# Patient Record
Sex: Male | Born: 1950 | Race: White | Hispanic: No | Marital: Married | Smoking: Former smoker
Health system: Southern US, Community
[De-identification: ages and names within clinical notes are randomized; demographics above are authoritative.]

## PROBLEM LIST (undated history)

## (undated) DIAGNOSIS — E119 Type 2 diabetes mellitus without complications: Secondary | ICD-10-CM

## (undated) DIAGNOSIS — J449 Chronic obstructive pulmonary disease, unspecified: Secondary | ICD-10-CM

## (undated) DIAGNOSIS — H269 Unspecified cataract: Secondary | ICD-10-CM

## (undated) DIAGNOSIS — E7800 Pure hypercholesterolemia, unspecified: Secondary | ICD-10-CM

## (undated) DIAGNOSIS — N400 Enlarged prostate without lower urinary tract symptoms: Secondary | ICD-10-CM

## (undated) DIAGNOSIS — G4733 Obstructive sleep apnea (adult) (pediatric): Secondary | ICD-10-CM

## (undated) DIAGNOSIS — G709 Myoneural disorder, unspecified: Secondary | ICD-10-CM

## (undated) DIAGNOSIS — I10 Essential (primary) hypertension: Secondary | ICD-10-CM

## (undated) DIAGNOSIS — F419 Anxiety disorder, unspecified: Secondary | ICD-10-CM

## (undated) DIAGNOSIS — J45909 Unspecified asthma, uncomplicated: Secondary | ICD-10-CM

## (undated) DIAGNOSIS — M1990 Unspecified osteoarthritis, unspecified site: Secondary | ICD-10-CM

## (undated) DIAGNOSIS — I509 Heart failure, unspecified: Secondary | ICD-10-CM

## (undated) DIAGNOSIS — C801 Malignant (primary) neoplasm, unspecified: Secondary | ICD-10-CM

## (undated) DIAGNOSIS — I219 Acute myocardial infarction, unspecified: Secondary | ICD-10-CM

## (undated) DIAGNOSIS — K219 Gastro-esophageal reflux disease without esophagitis: Secondary | ICD-10-CM

## (undated) DIAGNOSIS — T7840XA Allergy, unspecified, initial encounter: Secondary | ICD-10-CM

## (undated) DIAGNOSIS — G4730 Sleep apnea, unspecified: Secondary | ICD-10-CM

## (undated) DIAGNOSIS — F1020 Alcohol dependence, uncomplicated: Secondary | ICD-10-CM

## (undated) DIAGNOSIS — R011 Cardiac murmur, unspecified: Secondary | ICD-10-CM

## (undated) HISTORY — PX: ACHILLES TENDON REPAIR: SUR1153

## (undated) HISTORY — PX: CARPAL TUNNEL RELEASE: SHX101

## (undated) HISTORY — DX: Acute myocardial infarction, unspecified: I21.9

## (undated) HISTORY — DX: Gastro-esophageal reflux disease without esophagitis: K21.9

## (undated) HISTORY — DX: Sleep apnea, unspecified: G47.30

## (undated) HISTORY — DX: Unspecified osteoarthritis, unspecified site: M19.90

## (undated) HISTORY — PX: KNEE SURGERY: SHX244

## (undated) HISTORY — PX: TONSILLECTOMY: SUR1361

## (undated) HISTORY — DX: Unspecified asthma, uncomplicated: J45.909

## (undated) HISTORY — PX: WRIST FRACTURE SURGERY: SHX121

## (undated) HISTORY — PX: EYE SURGERY: SHX253

## (undated) HISTORY — DX: Unspecified cataract: H26.9

## (undated) HISTORY — DX: Myoneural disorder, unspecified: G70.9

## (undated) HISTORY — DX: Cardiac murmur, unspecified: R01.1

## (undated) HISTORY — PX: KNEE ARTHROSCOPY: SUR90

## (undated) HISTORY — DX: Anxiety disorder, unspecified: F41.9

## (undated) HISTORY — PX: CARDIAC CATHETERIZATION: SHX172

## (undated) HISTORY — DX: Allergy, unspecified, initial encounter: T78.40XA

---

## 1998-11-04 ENCOUNTER — Encounter: Payer: Self-pay | Admitting: Interventional Cardiology

## 1998-11-04 ENCOUNTER — Observation Stay (HOSPITAL_COMMUNITY): Admission: AD | Admit: 1998-11-04 | Discharge: 1998-11-05 | Payer: Self-pay | Admitting: Interventional Cardiology

## 2004-03-11 ENCOUNTER — Encounter: Admission: RE | Admit: 2004-03-11 | Discharge: 2004-03-11 | Payer: Self-pay | Admitting: Interventional Cardiology

## 2004-03-15 ENCOUNTER — Inpatient Hospital Stay (HOSPITAL_BASED_OUTPATIENT_CLINIC_OR_DEPARTMENT_OTHER): Admission: RE | Admit: 2004-03-15 | Discharge: 2004-03-15 | Payer: Self-pay | Admitting: Cardiology

## 2004-04-05 ENCOUNTER — Encounter: Admission: RE | Admit: 2004-04-05 | Discharge: 2004-04-05 | Payer: Self-pay | Admitting: Family Medicine

## 2005-04-08 ENCOUNTER — Encounter: Admission: RE | Admit: 2005-04-08 | Discharge: 2005-04-08 | Payer: Self-pay | Admitting: Family Medicine

## 2005-10-21 ENCOUNTER — Encounter: Admission: RE | Admit: 2005-10-21 | Discharge: 2005-10-21 | Payer: Self-pay | Admitting: Family Medicine

## 2006-07-31 ENCOUNTER — Encounter: Admission: RE | Admit: 2006-07-31 | Discharge: 2006-07-31 | Payer: Self-pay | Admitting: Family Medicine

## 2006-11-08 ENCOUNTER — Encounter: Admission: RE | Admit: 2006-11-08 | Discharge: 2006-11-08 | Payer: Self-pay | Admitting: Endocrinology

## 2006-11-08 ENCOUNTER — Other Ambulatory Visit: Admission: RE | Admit: 2006-11-08 | Discharge: 2006-11-08 | Payer: Self-pay | Admitting: Interventional Radiology

## 2006-11-08 ENCOUNTER — Encounter (INDEPENDENT_AMBULATORY_CARE_PROVIDER_SITE_OTHER): Payer: Self-pay | Admitting: Interventional Radiology

## 2010-06-12 ENCOUNTER — Encounter: Payer: Self-pay | Admitting: Family Medicine

## 2010-06-13 ENCOUNTER — Encounter: Payer: Self-pay | Admitting: Family Medicine

## 2010-10-08 NOTE — Cardiovascular Report (Signed)
NAME:  EWARD, RUTIGLIANO NO.:  [id number]   MEDICAL RECORD NO.:  [id number]          PATIENT TYPE:  OIB   LOCATION:  6501                         FACILITY:  [facility]   PHYSICIAN:  Lyn Records III, M.D.DATE OF BIRTH:  18-Sep-1950   DATE OF PROCEDURE:  03/15/2004  DATE OF DISCHARGE:                              CARDIAC CATHETERIZATION   INDICATIONS:  Abnormal Cardiolite study, recent chest discomfort.   PROCEDURE PERFORMED:  1.  Left heart catheterization.  2.  Selective coronary angiography.  3.  Left ventriculography.  __________   DESCRIPTION:  After informed consent a 4-French sheath was placed in the  right femoral artery using the modified Seldinger technique.  A 4-French A2  multipurpose catheter was used for hemodynamic recordings, left  ventriculography by hand injection, and selective left and right coronary  angiography.  __________ intracoronary nitroglycerin __________ left  coronary after the first dye injection to relieve what appeared to be  diffuse left __________ spasm.  Patient tolerated the procedure without any  complications.   RESULTS:   I. HEMODYNAMIC DATA:  A.  Aortic pressure  120/7.  B.  Left ventricular pressure __________.   II. LEFT VENTRICULOGRAPHY:  The left ventricle is normal in size and  demonstrates normal overall contractility.  EF was 60%.   III. SELECTIVE CORONARY ANGIOGRAPHY:  A.  Left main coronary:  After first  coronary injection, there appeared to be diffuse intracoronary spasm  throughout the entire left coronary system.  This was relieved after a  single dose of intracoronary nitroglycerin __________.  B.  Left anterior descending coronary:  The left anterior descending artery  is large, transapical.  Gives origin to diagonal branches __________.  C.  Circumflex artery:  Circumflex coronary artery is large giving origin to  three obtuse marginal branches, entirely normal.  D.  Right coronary:  The right coronary  artery is large, gives origin to a  large PDA and two left ventricular branches.  This vessel is normal.   CONCLUSION:  1.  Diffuse left coronary spasm relieved with intracoronary nitroglycerin.  2.  There are no coronary obstructive lesions noted.  Coronary anatomy is      normally distributed and there are __________.  3.  Normal left ventricular function.   PLAN:  In addition to the current medical regimen for hypertension, will add  calcium channel blocker therapy __________ possibility of coronary artery  spasm.       HWS/MEDQ  D:  03/15/2004  T:  03/15/2004  Job:  [id number]   cc:   Oley Balm. Georgina Pillion, M.D.  [address]  [place]  [place] [address]  Fax: [phone]

## 2010-10-08 NOTE — Cardiovascular Report (Signed)
NAME:  Francis Cardenas, Francis Cardenas NO.:  [id number]   MEDICAL RECORD NO.:  [id number]          PATIENT TYPE:  OIB   LOCATION:  6501                         FACILITY:  [facility]   PHYSICIAN:  Lyn Records III, M.D.DATE OF BIRTH:  Feb 26, 1951   DATE OF PROCEDURE:  DATE OF DISCHARGE:                              CARDIAC CATHETERIZATION   No dictation on this job.       HWS/MEDQ  D:  03/15/2004  T:  03/15/2004  Job:  [id number]

## 2013-03-24 ENCOUNTER — Encounter (HOSPITAL_COMMUNITY): Payer: Self-pay | Admitting: Emergency Medicine

## 2013-03-24 ENCOUNTER — Emergency Department (HOSPITAL_COMMUNITY)
Admission: EM | Admit: 2013-03-24 | Discharge: 2013-03-26 | Disposition: A | Discharge status: Other Acute Inpatient Hospital | Payer: Non-veteran care | Attending: Emergency Medicine | Admitting: Emergency Medicine

## 2013-03-24 DIAGNOSIS — Z7982 Long term (current) use of aspirin: Secondary | ICD-10-CM | POA: Insufficient documentation

## 2013-03-24 DIAGNOSIS — Z8546 Personal history of malignant neoplasm of prostate: Secondary | ICD-10-CM | POA: Insufficient documentation

## 2013-03-24 DIAGNOSIS — F10229 Alcohol dependence with intoxication, unspecified: Secondary | ICD-10-CM | POA: Insufficient documentation

## 2013-03-24 DIAGNOSIS — I10 Essential (primary) hypertension: Secondary | ICD-10-CM | POA: Insufficient documentation

## 2013-03-24 DIAGNOSIS — Z79899 Other long term (current) drug therapy: Secondary | ICD-10-CM | POA: Insufficient documentation

## 2013-03-24 DIAGNOSIS — E119 Type 2 diabetes mellitus without complications: Secondary | ICD-10-CM | POA: Insufficient documentation

## 2013-03-24 DIAGNOSIS — F1020 Alcohol dependence, uncomplicated: Secondary | ICD-10-CM

## 2013-03-24 DIAGNOSIS — E7800 Pure hypercholesterolemia, unspecified: Secondary | ICD-10-CM | POA: Insufficient documentation

## 2013-03-24 DIAGNOSIS — F32A Depression, unspecified: Secondary | ICD-10-CM | POA: Diagnosis present

## 2013-03-24 DIAGNOSIS — F1010 Alcohol abuse, uncomplicated: Secondary | ICD-10-CM | POA: Diagnosis present

## 2013-03-24 DIAGNOSIS — F329 Major depressive disorder, single episode, unspecified: Secondary | ICD-10-CM

## 2013-03-24 DIAGNOSIS — Z87448 Personal history of other diseases of urinary system: Secondary | ICD-10-CM | POA: Insufficient documentation

## 2013-03-24 DIAGNOSIS — Z87891 Personal history of nicotine dependence: Secondary | ICD-10-CM | POA: Insufficient documentation

## 2013-03-24 HISTORY — DX: Type 2 diabetes mellitus without complications: E11.9

## 2013-03-24 HISTORY — DX: Malignant (primary) neoplasm, unspecified: C80.1

## 2013-03-24 HISTORY — DX: Pure hypercholesterolemia, unspecified: E78.00

## 2013-03-24 HISTORY — DX: Essential (primary) hypertension: I10

## 2013-03-24 HISTORY — DX: Benign prostatic hyperplasia without lower urinary tract symptoms: N40.0

## 2013-03-24 LAB — COMPREHENSIVE METABOLIC PANEL
AST: 41 U/L — ABNORMAL HIGH (ref 0–37)
Albumin: 3.9 g/dL (ref 3.5–5.2)
Calcium: 10.5 mg/dL (ref 8.4–10.5)
Creatinine, Ser: 1.11 mg/dL (ref 0.50–1.35)
GFR calc non Af Amer: 68 mL/min — ABNORMAL LOW (ref >90)
Glucose, Bld: 116 mg/dL — ABNORMAL HIGH (ref 70–99)
Sodium: 123 mEq/L — ABNORMAL LOW (ref 135–145)

## 2013-03-24 LAB — URINALYSIS, ROUTINE W REFLEX MICROSCOPIC
Bilirubin Urine: NEGATIVE
Glucose, UA: NEGATIVE mg/dL
Hgb urine dipstick: NEGATIVE
Specific Gravity, Urine: 1.014 (ref 1.005–1.030)
Urobilinogen, UA: 0.2 mg/dL (ref 0.0–1.0)

## 2013-03-24 LAB — LIPASE, BLOOD: Lipase: 36 U/L (ref 11–59)

## 2013-03-24 LAB — POCT I-STAT, CHEM 8
BUN: 16 mg/dL (ref 6–23)
Chloride: 95 mEq/L — ABNORMAL LOW (ref 96–112)
Creatinine, Ser: 1.4 mg/dL — ABNORMAL HIGH (ref 0.50–1.35)
Glucose, Bld: 127 mg/dL — ABNORMAL HIGH (ref 70–99)
Sodium: 131 mEq/L — ABNORMAL LOW (ref 135–145)
TCO2: 26 mmol/L (ref 0–100)

## 2013-03-24 LAB — CBC
MCH: 30.3 pg (ref 26.0–34.0)
MCV: 88.4 fL (ref 78.0–100.0)
Platelets: 216 10*3/uL (ref 150–400)
RDW: 13.9 % (ref 11.5–15.5)

## 2013-03-24 LAB — RAPID URINE DRUG SCREEN, HOSP PERFORMED
Amphetamines: NOT DETECTED
Cocaine: NOT DETECTED
Opiates: NOT DETECTED
Tetrahydrocannabinol: NOT DETECTED

## 2013-03-24 MED ORDER — FUROSEMIDE 20 MG PO TABS
20.0000 mg | ORAL_TABLET | Freq: Two times a day (BID) | ORAL | Status: DC
  Administered 2013-03-24 – 2013-03-25 (×3): 20 mg via ORAL
  Filled 2013-03-24 (×5): qty 1

## 2013-03-24 MED ORDER — PANTOPRAZOLE SODIUM 40 MG PO TBEC
40.0000 mg | DELAYED_RELEASE_TABLET | Freq: Every day | ORAL | Status: DC
  Administered 2013-03-24 – 2013-03-25 (×2): 40 mg via ORAL
  Filled 2013-03-24 (×2): qty 1

## 2013-03-24 MED ORDER — SPIRONOLACTONE 25 MG PO TABS
25.0000 mg | ORAL_TABLET | Freq: Two times a day (BID) | ORAL | Status: DC
  Administered 2013-03-25 (×3): 25 mg via ORAL
  Filled 2013-03-24 (×5): qty 1

## 2013-03-24 MED ORDER — CHLORDIAZEPOXIDE HCL 25 MG PO CAPS
25.0000 mg | ORAL_CAPSULE | Freq: Four times a day (QID) | ORAL | Status: AC
  Administered 2013-03-24 – 2013-03-25 (×4): 25 mg via ORAL
  Filled 2013-03-24 (×4): qty 1

## 2013-03-24 MED ORDER — CHLORDIAZEPOXIDE HCL 25 MG PO CAPS: 25.0000 mg | ORAL_CAPSULE | Freq: Four times a day (QID) | ORAL | Status: DC | PRN

## 2013-03-24 MED ORDER — ACETAMINOPHEN 325 MG PO TABS
650.0000 mg | ORAL_TABLET | Freq: Once | ORAL | Status: AC
Start: 2013-03-24 — End: 2013-03-24
  Administered 2013-03-24: 650 mg via ORAL
  Filled 2013-03-24: qty 2

## 2013-03-24 MED ORDER — SODIUM CHLORIDE 0.9 % IV BOLUS (SEPSIS)
1000.0000 mL | Freq: Once | INTRAVENOUS | Status: AC
  Administered 2013-03-24: 1000 mL via INTRAVENOUS

## 2013-03-24 MED ORDER — ADULT MULTIVITAMIN W/MINERALS CH
1.0000 | ORAL_TABLET | Freq: Every day | ORAL | Status: DC
  Administered 2013-03-24 – 2013-03-25 (×2): 1 via ORAL
  Filled 2013-03-24: qty 1

## 2013-03-24 MED ORDER — VITAMIN B-1 100 MG PO TABS
100.0000 mg | ORAL_TABLET | Freq: Every day | ORAL | Status: DC
  Administered 2013-03-25: 10:00:00 100 mg via ORAL
  Filled 2013-03-24: qty 1

## 2013-03-24 MED ORDER — ADULT MULTIVITAMIN W/MINERALS CH
1.0000 | ORAL_TABLET | Freq: Every day | ORAL | Status: DC
  Filled 2013-03-24: qty 1

## 2013-03-24 MED ORDER — HYDROXYZINE HCL 25 MG PO TABS: 25.0000 mg | ORAL_TABLET | Freq: Four times a day (QID) | ORAL | Status: DC | PRN

## 2013-03-24 MED ORDER — CHLORDIAZEPOXIDE HCL 25 MG PO CAPS: 25.0000 mg | ORAL_CAPSULE | ORAL | Status: DC

## 2013-03-24 MED ORDER — CHLORDIAZEPOXIDE HCL 25 MG PO CAPS
25.0000 mg | ORAL_CAPSULE | Freq: Once | ORAL | Status: AC
  Administered 2013-03-24: 25 mg via ORAL
  Filled 2013-03-24: qty 1

## 2013-03-24 MED ORDER — ASPIRIN EC 81 MG PO TBEC
81.0000 mg | DELAYED_RELEASE_TABLET | Freq: Every day | ORAL | Status: DC
  Administered 2013-03-25: 81 mg via ORAL
  Filled 2013-03-24: qty 1

## 2013-03-24 MED ORDER — THIAMINE HCL 100 MG/ML IJ SOLN
100.0000 mg | Freq: Once | INTRAMUSCULAR | Status: AC
  Administered 2013-03-24: 100 mg via INTRAMUSCULAR
  Filled 2013-03-24: qty 2

## 2013-03-24 MED ORDER — CARVEDILOL 25 MG PO TABS
25.0000 mg | ORAL_TABLET | Freq: Two times a day (BID) | ORAL | Status: DC
  Administered 2013-03-25 (×2): 25 mg via ORAL
  Filled 2013-03-24 (×3): qty 1

## 2013-03-24 MED ORDER — LISINOPRIL 40 MG PO TABS
40.0000 mg | ORAL_TABLET | Freq: Every day | ORAL | Status: DC
  Administered 2013-03-25: 40 mg via ORAL
  Filled 2013-03-24: qty 1

## 2013-03-24 MED ORDER — CHLORDIAZEPOXIDE HCL 25 MG PO CAPS
25.0000 mg | ORAL_CAPSULE | Freq: Three times a day (TID) | ORAL | Status: DC
  Administered 2013-03-25: 25 mg via ORAL
  Filled 2013-03-24: qty 1

## 2013-03-24 MED ORDER — LOPERAMIDE HCL 2 MG PO CAPS: 2.0000 mg | ORAL_CAPSULE | ORAL | Status: DC | PRN

## 2013-03-24 MED ORDER — AMLODIPINE BESYLATE 10 MG PO TABS
10.0000 mg | ORAL_TABLET | Freq: Every day | ORAL | Status: DC
  Administered 2013-03-25: 10 mg via ORAL
  Filled 2013-03-24: qty 1

## 2013-03-24 MED ORDER — SIMVASTATIN 20 MG PO TABS
20.0000 mg | ORAL_TABLET | Freq: Every day | ORAL | Status: DC
  Administered 2013-03-24 – 2013-03-25 (×2): 20 mg via ORAL
  Filled 2013-03-24 (×2): qty 1

## 2013-03-24 MED ORDER — SERTRALINE HCL 50 MG PO TABS
100.0000 mg | ORAL_TABLET | Freq: Every day | ORAL | Status: DC
  Administered 2013-03-24 – 2013-03-25 (×2): 100 mg via ORAL
  Filled 2013-03-24 (×2): qty 2

## 2013-03-24 MED ORDER — SERTRALINE HCL 50 MG PO TABS: 100.0000 mg | ORAL_TABLET | Freq: Every day | ORAL | Status: DC

## 2013-03-24 MED ORDER — ALBUTEROL SULFATE HFA 108 (90 BASE) MCG/ACT IN AERS: 2.0000 | INHALATION_SPRAY | Freq: Four times a day (QID) | RESPIRATORY_TRACT | Status: DC | PRN

## 2013-03-24 MED ORDER — ONDANSETRON 4 MG PO TBDP: 4.0000 mg | ORAL_TABLET | Freq: Four times a day (QID) | ORAL | Status: DC | PRN

## 2013-03-24 MED ORDER — CHLORDIAZEPOXIDE HCL 25 MG PO CAPS: 25.0000 mg | ORAL_CAPSULE | Freq: Every day | ORAL | Status: DC

## 2013-03-24 NOTE — ED Notes (Signed)
Bed: WA25 Expected date:  Expected time:  Means of arrival:  Comments: Hall A 

## 2013-03-24 NOTE — ED Notes (Signed)
Pt's belongings were moved to locker 25 TCU.

## 2013-03-24 NOTE — ED Notes (Signed)
Pt's belongings moved to locker 27

## 2013-03-24 NOTE — BH Assessment (Signed)
Assessment Note  Francis Cardenas is an 62 y.o. male.   Pt consumed beers as he drove himself to the ER today.  Pt reports "I drink everyday and I start when I get up."  [facility] and [facility] thru them due to disability.  Pt has been diagnosed with PTSD and is active with [facility] due to medical and SA related issues over the years.  Pt reports he is suicidal with no immediate plan or intent.  "If I could and I wanted to end my life I would use a gun, but I don't own one and have no plans to do it, but that would be the way if I did."  Pt denies HI and AVH.    Pt served in [employer] and was active in [place] War.  Pt served [phone] and again [phone]. 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  "My wife has always had what she needed.  Yet she doesn't want to help me."  When asked if his marriage is good pt responded "She would say so, but I don't think so."  Pt has been married for 17 years.  Pt reports [facility] [place] [facility].  Pt does not like to go to the [place] in [place] because it's too far.  Pt wants to be detoxed locally and then potentially sent to the [facility] rehab center.  "If there was a rehab locally I would prefer that over the [place]."    Recommendation  Pt would not be accepted at [facility] or [facility] due to [place] coverage and mild SI with plan with gun.  [facility] will consider pt for admission and psychiatry will round on pt to determine dispo.    Axis I: Generalized Anxiety Disorder, Major Depression, Recurrent severe, Post Traumatic Stress Disorder and Substance Abuse Axis II: Deferred Axis III:  Past Medical History  Diagnosis Date  . Diabetes mellitus without complication   . Hypertension   . Hypercholesteremia   . BPH (benign prostatic hyperplasia)   . Cancer     prostate   Axis IV: other psychosocial or environmental problems, problems related to social environment and problems with primary support group Axis V:  41-50 serious symptoms  Past Medical History:  Past Medical History  Diagnosis Date  . Diabetes mellitus without complication   . Hypertension   . Hypercholesteremia   . BPH (benign prostatic hyperplasia)   . Cancer     prostate    Past Surgical History  Procedure Laterality Date  . Tonsillectomy    . Knee arthroscopy Left   . Wrist fracture surgery Left   . Achilles tendon repair Left     Family History: No family history on file.  Social History:  reports that he has quit smoking. He does not have any smokeless tobacco history on file. He reports that he drinks about 10.8 ounces of alcohol per week. He reports that he does not use illicit drugs.  Additional Social History:  Alcohol / Drug Use Pain Medications: na Prescriptions: na Over the Counter: na History of alcohol / drug use?: Yes Longest period of sobriety (when/how long): 30 days Negative Consequences of Use: Personal relationships Substance #1 Name of Substance 1: alcohol 1 - Age of First Use: 15 1 - Amount (size/oz): 18 to 24 beers daily 1 - Frequency: daily 1 - Duration: years 1 - Last Use / Amount: 03-24-13  CIWA: CIWA-Ar BP: 122/46 mmHg Pulse Rate: 81 Nausea and Vomiting: no nausea and no vomiting Tactile Disturbances: none Tremor: no tremor Auditory Disturbances: not present Paroxysmal Sweats: no sweat visible Visual Disturbances: not present Anxiety: no anxiety, at ease Headache, Fullness in Head: none present Agitation: normal activity Orientation and Clouding of Sensorium: oriented and can do serial additions CIWA-Ar Total: 0 COWS:    Allergies: No Known Allergies  Home Medications:  (Not in a hospital admission)  OB/GYN Status:  No LMP for male patient.  General Assessment Data Location of Assessment: [facility] ED Is this a Tele or Face-to-Face Assessment?: Face-to-Face Is this an Initial Assessment or a Re-assessment for this encounter?: Initial Assessment Living Arrangements:  Spouse/significant other Can pt return to current living arrangement?: Yes Admission Status: Voluntary Is patient capable of signing voluntary admission?: Yes Transfer from: Acute Hospital Referral Source: MD  Medical Screening Exam [facility] Walk-in ONLY) Medical Exam completed: Yes  [facility] Crisis Care Plan Living Arrangements: Spouse/significant other  Education Status Is patient currently in school?: No Current Grade: na Highest grade of school patient has completed: na Name of school: na Contact person: na  Risk to self Suicidal Ideation: Yes-Currently Present Suicidal Intent: No-Not Currently/Within Last 6 Months Is patient at risk for suicide?: Yes Suicidal Plan?: Yes-Currently Present Specify Current Suicidal Plan: " I would use a gun, but I don't have one" Access to Means: No Specify Access to Suicidal Means: pt denies access to means What has been your use of drugs/alcohol within the last 12 months?: alcohol Previous Attempts/Gestures: No How many times?: 0 Other Self Harm Risks: na Triggers for Past Attempts: None known Intentional Self Injurious Behavior: None Family Suicide History: No Recent stressful life event(s): Other (Comment) (marriage issues) Persecutory voices/beliefs?: No Depression: Yes Depression Symptoms: Isolating;Fatigue;Guilt;Loss of interest in usual pleasures;Feeling worthless/self pity;Feeling angry/irritable Substance abuse history and/or treatment for substance abuse?: Yes Suicide prevention information given to non-admitted patients: Not applicable  Risk to Others Homicidal Ideation: No Thoughts of Harm to Others: No Current Homicidal Intent: No Current Homicidal Plan: No Access to Homicidal Means: No Identified Victim: na History of harm to others?: No Assessment of Violence: None Noted Violent Behavior Description: cooperative Does patient have access to weapons?: No (pt says no but pt was in [employer]) Criminal Charges Pending?:  No Does patient have a court date: No  Psychosis Hallucinations: None noted Delusions: None noted  Mental Status Report Appear/Hygiene: Disheveled Eye Contact: Good Motor Activity: Unremarkable Speech: Logical/coherent Level of Consciousness: Alert Mood: Depressed;Anxious;Sad;Worthless, low self-esteem Affect: Anxious;Appropriate to circumstance;Depressed;Sad Anxiety Level: Moderate Thought Processes: Coherent Judgement: Impaired Orientation: Person;Place;Situation;Appropriate for developmental age Obsessive Compulsive Thoughts/Behaviors: None  Cognitive Functioning Concentration: Decreased Memory: Recent Intact;Remote Intact IQ: Average Insight: Poor Impulse Control: Poor Appetite: Good Weight Loss: 0 Weight Gain: 0 Sleep: Decreased Total Hours of Sleep: 5 Vegetative Symptoms: None  ADLScreening [facility] Assessment Services) Patient's cognitive ability adequate to safely complete daily activities?: Yes Patient able to express need for assistance with ADLs?: Yes Independently performs ADLs?: Yes (appropriate for developmental age) (immediate needs - yes; difficult walking distance)  Prior Inpatient Therapy Prior Inpatient Therapy: Yes Prior Therapy Dates: 2012 Prior Therapy Facilty/Provider(s): inpatient in [place] Reason for Treatment: depression   Prior Outpatient Therapy Prior Outpatient Therapy: Yes Prior Therapy Dates: current [facility] Reason for Treatment: med mgt and optx  ADL Screening (condition at time of admission) Patient's cognitive ability adequate to safely complete daily activities?: Yes Is the patient  deaf or have difficulty hearing?: No Does the patient have difficulty seeing, even when wearing glasses/contacts?: No Does the patient have difficulty concentrating, remembering, or making decisions?: No Patient able to express need for assistance with ADLs?: Yes Does the patient have difficulty dressing  or bathing?: No Independently performs ADLs?: Yes (appropriate for developmental age) (immediate needs - yes; difficult walking distance) Does the patient have difficulty walking or climbing stairs?: No Weakness of Legs: None Weakness of Arms/Hands: None  Home Assistive Devices/Equipment Home Assistive Devices/Equipment: None (aches and issues; can take care of needs; )  Therapy Consults (therapy consults require a physician order) PT Evaluation Needed: No OT Evalulation Needed: No SLP Evaluation Needed: No Abuse/Neglect Assessment (Assessment to be complete while patient is alone) Physical Abuse: Denies Verbal Abuse: Denies Sexual Abuse: Denies Exploitation of patient/patient's resources: Denies Self-Neglect: Denies Values / Beliefs Cultural Requests During Hospitalization: None Spiritual Requests During Hospitalization: None Consults Spiritual Care Consult Needed: No Social Work Consult Needed: No Merchant navy officer (For Healthcare) Advance Directive: Patient does not have advance directive Pre-existing out of facility DNR order (yellow form or pink MOST form): No    Additional Information 1:1 In Past 12 Months?: No CIRT Risk: No Elopement Risk: No Does patient have medical clearance?: Yes     Disposition:  Disposition Initial Assessment Completed for this Encounter: Yes Disposition of Patient: Inpatient treatment program Type of inpatient treatment program: Adult  On Site Evaluation by:   Reviewed with Physician:    Titus Mould, Eppie Gibson 03/24/2013 1:04 PM

## 2013-03-24 NOTE — ED Notes (Signed)
TTS at bedside. 

## 2013-03-24 NOTE — ED Notes (Signed)
Pt belongings x2 bags placed into locker # 27  

## 2013-03-24 NOTE — ED Provider Notes (Signed)
CSN: [id number]     Arrival date & time 03/24/13  1043 History   First MD Initiated Contact with Patient 03/24/13 1146     Chief Complaint  Patient presents with  . Alcohol Problem   (Consider location/radiation/quality/duration/timing/severity/associated sxs/prior Treatment) HPI Comments: Pt states that he has been drinking since he was 15:pt states that he drinks a minimum of 18 beers a day and he had one on the way here:pt states that he had inpt detox 2 years ago in [place]:pt states that he is not suicidal today although his has had thoughts in the past, but he states that he would need to use a gun and he doesn't have one:pt states that he came in today because he has isolated himself from everyone in his family:pt states that he can't go anywhere because he can't not drink or if he does go somewhere he brings a cooler so that he can drink when he is finished  The history is provided by the patient. No language interpreter was used.    Past Medical History  Diagnosis Date  . Diabetes mellitus without complication   . Hypertension   . Hypercholesteremia   . BPH (benign prostatic hyperplasia)   . Cancer     prostate   Past Surgical History  Procedure Laterality Date  . Tonsillectomy    . Knee arthroscopy Left   . Wrist fracture surgery Left   . Achilles tendon repair Left    No family history on file. History  Substance Use Topics  . Smoking status: Former Games developer  . Smokeless tobacco: Not on file  . Alcohol Use: 10.8 oz/week    18 Cans of beer per week    Review of Systems  Constitutional: Negative.   Respiratory: Negative.   Cardiovascular: Negative.     Allergies  Review of patient's allergies indicates no known allergies.  Home Medications   Current Outpatient Rx  Name  Route  Sig  Dispense  Refill  . albuterol (PROVENTIL HFA;VENTOLIN HFA) 108 (90 BASE) MCG/ACT inhaler   Inhalation   Inhale 2 puffs into the lungs every 6 (six) hours as needed for wheezing.         Marland Kitchen amLODipine (NORVASC) 10 MG tablet   Oral   Take 10 mg by mouth daily.         Marland Kitchen aspirin EC 81 MG tablet   Oral   Take 81 mg by mouth daily.         . carvedilol (COREG) 25 MG tablet   Oral   Take 25 mg by mouth 2 (two) times daily with a meal.         . furosemide (LASIX) 20 MG tablet   Oral   Take 20 mg by mouth 2 (two) times daily.         Marland Kitchen lisinopril (PRINIVIL,ZESTRIL) 40 MG tablet   Oral   Take 40 mg by mouth daily.         . Multiple Vitamin (MULTIVITAMIN WITH MINERALS) TABS tablet   Oral   Take 1 tablet by mouth daily.         Marland Kitchen omeprazole (PRILOSEC) 20 MG capsule   Oral   Take 20 mg by mouth daily.         . pravastatin (PRAVACHOL) 40 MG tablet   Oral   Take 80 mg by mouth at bedtime.         . sertraline (ZOLOFT) 100 MG tablet   Oral  Take 100 mg by mouth daily.         Marland Kitchen spironolactone (ALDACTONE) 25 MG tablet   Oral   Take 25 mg by mouth 2 (two) times daily.          BP 122/46  Pulse 81  Temp(Src) 98.5 F (36.9 C) (Oral)  Resp 18  SpO2 94% Physical Exam  Nursing note and vitals reviewed. Constitutional: He is oriented to person, place, and time. He appears well-developed and well-nourished.  HENT:  Head: Normocephalic and atraumatic.  Cardiovascular: Normal rate and regular rhythm.   Pulmonary/Chest: Effort normal and breath sounds normal.  Abdominal: Soft. Bowel sounds are normal. There is no tenderness.  Musculoskeletal: Normal range of motion.  Neurological: He is alert and oriented to person, place, and time. Coordination normal.  Skin: Skin is warm and dry.  Psychiatric: He has a normal mood and affect.    ED Course  Procedures (including critical care time) Labs Review Labs Reviewed  CBC - Abnormal; Notable for the following:    Hemoglobin 12.8 (*)    HCT 37.3 (*)    All other components within normal limits  COMPREHENSIVE METABOLIC PANEL - Abnormal; Notable for the following:    Sodium 123 (*)     Chloride 85 (*)    Glucose, Bld 116 (*)    AST 41 (*)    GFR calc non Af Amer 68 (*)    GFR calc Af Amer 79 (*)    All other components within normal limits  ETHANOL - Abnormal; Notable for the following:    Alcohol, Ethyl (B) 168 (*)    All other components within normal limits  LIPASE, BLOOD  URINE RAPID DRUG SCREEN (HOSP PERFORMED)  URINALYSIS, ROUTINE W REFLEX MICROSCOPIC   Imaging Review No results found.  EKG Interpretation   None       MDM   1. Alcohol abuse   2. Depressive disorder    Pt not having any dt's at this time:pt to be evaluated by act and placement to be found    Teressa Lower, NP 03/24/13 2026

## 2013-03-24 NOTE — ED Notes (Signed)
Pt from home requesting detox from [facility]. Pt states that his last drink was on the way here today. Pt normally drinks 12-18, 12oz can of beer/day. Pt is A&O and in NAD

## 2013-03-24 NOTE — Consult Note (Signed)
[facility] Face-to-Face Psychiatry Consult   Reason for Consult:  Alcohol detox and depression Referring Physician:  EDP  Francis Cardenas is an 62 y.o. male.  Assessment: AXIS I:  Alcohol Abuse and Depressive Disorder NOS AXIS II:  Deferred AXIS III:   Past Medical History  Diagnosis Date  . Diabetes mellitus without complication   . Hypertension   . Hypercholesteremia   . BPH (benign prostatic hyperplasia)   . Cancer     prostate   AXIS IV:  other psychosocial or environmental problems and problems with primary support group AXIS V:  41-50 serious symptoms  Plan:  No evidence of imminent risk to self or others at present.   Supportive therapy provided about ongoing stressors. Discussed crisis plan, support from social network, calling 911, coming to the Emergency Department, and calling Suicide Hotline.  Subjective:   Francis Cardenas is a 62 y.o. male.  HPI:  Patient states that he needs help with alcohol detox and depression. "I want to be detox and put into rehab.  Patient denies suicidal ideation at this time. "I have had suicide thoughts in the past but not at this time.  Patient also states that he has a history of hearing noises "You know like thumps and bumps or someone call and look not there.  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  Patient states that his last drink was today when he drank 2 beers on his way to the hospital.  Patient lives at home with his wife and states that his drinking alcohol has also affected their relationship.  "I need to get some help with alcohol."    HPI Elements:   Location:  [facility] ED'. Quality:  Affecting patient mentally. Severity:  Alcohol abuse.  Past Psychiatric History: Past Medical History  Diagnosis Date  . Diabetes mellitus without complication   . Hypertension   . Hypercholesteremia    . BPH (benign prostatic hyperplasia)   . Cancer     prostate    reports that he has quit smoking. He does not have any smokeless tobacco history on file. He reports that he drinks about 10.8 ounces of alcohol per week. He reports that he does not use illicit drugs. No family history on file. Family History Substance Abuse: No Family Supports: Yes, List: (pt reports marriage is not supportive) Living Arrangements: Spouse/significant other Can pt return to current living arrangement?: Yes Abuse/Neglect [facility]) Physical Abuse: Denies Verbal Abuse: Denies Sexual Abuse: Denies Allergies:  No Known Allergies  ACT Assessment Complete:  Yes:    Educational Status    Risk to Self: Risk to self Suicidal Ideation: Yes-Currently Present Suicidal Intent: No-Not Currently/Within Last 6 Months Is patient at risk for suicide?: Yes Suicidal Plan?: Yes-Currently Present Specify Current Suicidal Plan: " I would use a gun, but I don't have one" Access to Means: No Specify Access to Suicidal Means: pt denies access to means What has been your use of drugs/alcohol within the last 12 months?: alcohol Previous Attempts/Gestures: No How many times?: 0 Other Self Harm Risks: na Triggers for Past Attempts: None known Intentional Self Injurious Behavior: None Family Suicide History: No Recent stressful life event(s): Other (Comment) (marriage issues) Persecutory voices/beliefs?: No Depression: Yes Depression Symptoms: Isolating;Fatigue;Guilt;Loss of interest in usual pleasures;Feeling worthless/self pity;Feeling angry/irritable Substance abuse history and/or treatment for substance abuse?:  Yes Suicide prevention information given to non-admitted patients: Not applicable  Risk to Others: Risk to Others Homicidal Ideation: No Thoughts of Harm to Others: No Current Homicidal Intent: No Current Homicidal Plan: No Access to Homicidal Means: No Identified Victim: na History of harm to others?:  No Assessment of Violence: None Noted Violent Behavior Description: cooperative Does patient have access to weapons?: No (pt says no but pt was in [employer]) Criminal Charges Pending?: No Does patient have a court date: No  Abuse: Abuse/Neglect Assessment (Assessment to be complete while patient is alone) Physical Abuse: Denies Verbal Abuse: Denies Sexual Abuse: Denies Exploitation of patient/patient's resources: Denies Self-Neglect: Denies  Prior Inpatient Therapy: Prior Inpatient Therapy Prior Inpatient Therapy: Yes Prior Therapy Dates: 2012 Prior Therapy Facilty/Provider(s): inpatient in [place] Reason for Treatment: depression   Prior Outpatient Therapy: Prior Outpatient Therapy Prior Outpatient Therapy: Yes Prior Therapy Dates: current [facility] Reason for Treatment: med mgt and optx  Additional Information: Additional Information 1:1 In Past 12 Months?: No CIRT Risk: No Elopement Risk: No Does patient have medical clearance?: Yes                  Objective: Blood pressure 122/46, pulse 81, temperature 98.5 F (36.9 C), temperature source Oral, resp. rate 18, SpO2 94.00%.There is no height or weight on file to calculate BMI. Results for orders placed during the hospital encounter of 03/24/13 (from the past 72 hour(s))  CBC     Status: Abnormal   Collection Time    03/24/13 12:19 PM      Result Value Range   WBC 8.7  4.0 - 10.5 K/uL   RBC 4.22  4.22 - 5.81 MIL/uL   Hemoglobin 12.8 (*) 13.0 - 17.0 g/dL   HCT 60.4 (*) 54.0 - 98.1 %   MCV 88.4  78.0 - 100.0 fL   MCH 30.3  26.0 - 34.0 pg   MCHC 34.3  30.0 - 36.0 g/dL   RDW 19.1  47.8 - 29.5 %   Platelets 216  150 - 400 K/uL  COMPREHENSIVE METABOLIC PANEL     Status: Abnormal   Collection Time    03/24/13 12:19 PM      Result Value Range   Sodium 123 (*) 135 - 145 mEq/L   Potassium 4.0  3.5 - 5.1 mEq/L   Chloride 85 (*) 96 - 112 mEq/L   CO2 25  19 - 32 mEq/L    Glucose, Bld 116 (*) 70 - 99 mg/dL   BUN 13  6 - 23 mg/dL   Creatinine, Ser 6.21  0.50 - 1.35 mg/dL   Calcium 30.8  8.4 - 65.7 mg/dL   Total Protein 7.8  6.0 - 8.3 g/dL   Albumin 3.9  3.5 - 5.2 g/dL   AST 41 (*) 0 - 37 U/L   ALT 42  0 - 53 U/L   Alkaline Phosphatase 86  39 - 117 U/L   Total Bilirubin 0.3  0.3 - 1.2 mg/dL   GFR calc non Af Amer 68 (*) >90 mL/min   GFR calc Af Amer 79 (*) >90 mL/min   Comment: (NOTE)     The eGFR has been calculated using the CKD EPI equation.     This calculation has not been validated in all clinical situations.     eGFR's persistently <90 mL/min signify possible Chronic Kidney     Disease.  LIPASE, BLOOD     Status: None   Collection  Time    03/24/13 12:19 PM      Result Value Range   Lipase 36  11 - 59 U/L  ETHANOL     Status: Abnormal   Collection Time    03/24/13 12:19 PM      Result Value Range   Alcohol, Ethyl (B) 168 (*) 0 - 11 mg/dL   Comment:            LOWEST DETECTABLE LIMIT FOR     SERUM ALCOHOL IS 11 mg/dL     FOR MEDICAL PURPOSES ONLY  URINALYSIS, ROUTINE W REFLEX MICROSCOPIC     Status: None   Collection Time    03/24/13 12:55 PM      Result Value Range   Color, Urine YELLOW  YELLOW   APPearance CLEAR  CLEAR   Specific Gravity, Urine 1.014  1.005 - 1.030   pH 6.0  5.0 - 8.0   Glucose, UA NEGATIVE  NEGATIVE mg/dL   Hgb urine dipstick NEGATIVE  NEGATIVE   Bilirubin Urine NEGATIVE  NEGATIVE   Ketones, ur NEGATIVE  NEGATIVE mg/dL   Protein, ur NEGATIVE  NEGATIVE mg/dL   Urobilinogen, UA 0.2  0.0 - 1.0 mg/dL   Nitrite NEGATIVE  NEGATIVE   Leukocytes, UA NEGATIVE  NEGATIVE   Comment: MICROSCOPIC NOT DONE ON URINES WITH NEGATIVE PROTEIN, BLOOD, LEUKOCYTES, NITRITE, OR GLUCOSE <1000 mg/dL.  URINE RAPID DRUG SCREEN (HOSP PERFORMED)     Status: None   Collection Time    03/24/13 12:56 PM      Result Value Range   Opiates NONE DETECTED  NONE DETECTED   Cocaine NONE DETECTED  NONE DETECTED   Benzodiazepines NONE DETECTED   NONE DETECTED   Amphetamines NONE DETECTED  NONE DETECTED   Tetrahydrocannabinol NONE DETECTED  NONE DETECTED   Barbiturates NONE DETECTED  NONE DETECTED   Comment:            DRUG SCREEN FOR MEDICAL PURPOSES     ONLY.  IF CONFIRMATION IS NEEDED     FOR ANY PURPOSE, NOTIFY LAB     WITHIN 5 DAYS.                LOWEST DETECTABLE LIMITS     FOR URINE DRUG SCREEN     Drug Class       Cutoff (ng/mL)     Amphetamine      1000     Barbiturate      200     Benzodiazepine   200     Tricyclics       300     Opiates          300     Cocaine          300     THC              50    No current facility-administered medications for this encounter.   Current Outpatient Prescriptions  Medication Sig Dispense Refill  . albuterol (PROVENTIL HFA;VENTOLIN HFA) 108 (90 BASE) MCG/ACT inhaler Inhale 2 puffs into the lungs every 6 (six) hours as needed for wheezing.      Marland Kitchen amLODipine (NORVASC) 10 MG tablet Take 10 mg by mouth daily.      Marland Kitchen aspirin EC 81 MG tablet Take 81 mg by mouth daily.      . carvedilol (COREG) 25 MG tablet Take 25 mg by mouth 2 (two) times daily with a meal.      .  furosemide (LASIX) 20 MG tablet Take 20 mg by mouth 2 (two) times daily.      Marland Kitchen lisinopril (PRINIVIL,ZESTRIL) 40 MG tablet Take 40 mg by mouth daily.      . Multiple Vitamin (MULTIVITAMIN WITH MINERALS) TABS tablet Take 1 tablet by mouth daily.      Marland Kitchen omeprazole (PRILOSEC) 20 MG capsule Take 20 mg by mouth daily.      . pravastatin (PRAVACHOL) 40 MG tablet Take 80 mg by mouth at bedtime.      . sertraline (ZOLOFT) 100 MG tablet Take 100 mg by mouth daily.      Marland Kitchen spironolactone (ALDACTONE) 25 MG tablet Take 25 mg by mouth 2 (two) times daily.        Psychiatric Specialty Exam:     Blood pressure 122/46, pulse 81, temperature 98.5 F (36.9 C), temperature source Oral, resp. rate 18, SpO2 94.00%.There is no height or weight on file to calculate BMI.  General Appearance: Disheveled  Eye Contact::  Good  Speech:   Clear and Coherent and Normal Rate  Volume:  Normal  Mood:  Anxious and Depressed  Affect:  Depressed  Thought Process:  Circumstantial and Goal Directed  Orientation:  Full (Time, Place, and Person)  Thought Content:  Rumination  Suicidal Thoughts:  No  Homicidal Thoughts:  No  Memory:  Immediate;   Good Recent;   Good  Judgement:  Impaired  Insight:  Lacking  Psychomotor Activity:  None noted at this time  Concentration:  Fair  Recall:  Good  Akathisia:  No  Handed:  Right  AIMS (if indicated):     Assets:  Communication Skills Desire for Improvement Housing Transportation  Sleep:      Face to face interview and consulted with Dr. Tawni Carnes  Treatment Plan Summary: Monitor overnight for alcohol withdrawal  Disposition:  Start Librium protocol.  Monitor overnight for alcohol withdrawal symptoms.  Discharge home with outpatient resources Refer to [facility] IOP.     Assunta Found, FNP-BC 03/24/2013 3:43 PM  This case was discussed with FNP at length over phone. Agree with above assessment and plan.  Ancil Linsey, MD

## 2013-03-25 LAB — COMPREHENSIVE METABOLIC PANEL
ALT: 37 U/L (ref 0–53)
AST: 33 U/L (ref 0–37)
Alkaline Phosphatase: 83 U/L (ref 39–117)
CO2: 25 mEq/L (ref 19–32)
Calcium: 10.8 mg/dL — ABNORMAL HIGH (ref 8.4–10.5)
Chloride: 97 mEq/L (ref 96–112)
Creatinine, Ser: 0.96 mg/dL (ref 0.50–1.35)
GFR calc non Af Amer: 86 mL/min — ABNORMAL LOW (ref >90)
Glucose, Bld: 163 mg/dL — ABNORMAL HIGH (ref 70–99)
Potassium: 3.8 mEq/L (ref 3.5–5.1)
Sodium: 132 mEq/L — ABNORMAL LOW (ref 135–145)

## 2013-03-25 NOTE — Progress Notes (Addendum)
Pt accepting to [facility] pending bed availability.  Pt referred to [facility] pending review.   Per chart review, Pt declined from [facility], [facility].  No beds at [facility] or [facility].

## 2013-03-25 NOTE — Progress Notes (Signed)
Spoke with patient about nocturnal CPAP. He states the RN has just left his room from letting him know that there is a bed assignment at another facility and he is to move soon. This information confirmed with RN. CPAP is not permitted in [facility] atmosphere for safety concerns and patient will not be moved back to a regular ED bed since he has a bed at [facility]

## 2013-03-25 NOTE — Progress Notes (Addendum)
   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 ( per [place]) EPIC had pt listed as self pay Pt states his pcp is at [facility] [phone] to reach one of pcp's RNs     Subjective/Objective Assessment Detail:     Action/Plan:   [facility] ED CM spoke ED SW about ED SW notes ED CM spoke with Madelaine Bhat in [facility] ED registration to get pt coverage updated in EPIC   Action/Plan Detail:   Anticipated DC Date:       Status Recommendation to Physician:   Result of Recommendation:    Other ED Services  Consult Working Plan    DC [employer]  Other    Choice offered to / List presented to:            Status of service:  Completed, signed off  ED Comments:   ED Comments Detail:

## 2013-03-25 NOTE — Consult Note (Signed)
[facility] Face-to-Face Psychiatry Consult   Reason for Consult:  Alcohol detox and depression Referring Physician:  EDP  Francis Cardenas is an 62 y.o. male.  Assessment: AXIS I:  Alcohol Abuse and Depressive Disorder NOS AXIS II:  Deferred AXIS III:   Past Medical History  Diagnosis Date  . Diabetes mellitus without complication   . Hypertension   . Hypercholesteremia   . BPH (benign prostatic hyperplasia)   . Cancer     prostate   AXIS IV:  other psychosocial or environmental problems and problems with primary support group AXIS V:  41-50 serious symptoms  Plan:  No evidence of imminent risk to self or others at present.   Supportive therapy provided about ongoing stressors. Discussed crisis plan, support from social network, calling 911, coming to the Emergency Department, and calling Suicide Hotline.  Subjective:   Francis Cardenas is a 62 y.o. male.  HPI:  Pt was interviewed today with FNP Julieanne Cotton. Pt reports drinking alcohol daily for the last 2 years, on average 18 beers per day. He reports a history of blackouts, but denies withdrawal seizures. He reports taking zoloft 100 mg daily for PTSD, but is unsure if it is helping. +Poor sleep. He denies current SI/HI/AVH.  Mild tremors reported. Mood is sad, and he feels that his wife is not supportive.    HPI Elements:   Location:  [facility] ED'. Quality:  Affecting patient mentally. Severity:  Alcohol abuse.  Past Psychiatric History: Past Medical History  Diagnosis Date  . Diabetes mellitus without complication   . Hypertension   . Hypercholesteremia   . BPH (benign prostatic hyperplasia)   . Cancer     prostate    reports that he has quit smoking. He does not have any smokeless tobacco history on file. He reports that he drinks about 10.8 ounces of alcohol per week. He reports that he does not use illicit drugs. No family history on file. Family History Substance Abuse: No Family Supports: Yes, List: (pt  reports marriage is not supportive) Living Arrangements: Spouse/significant other Can pt return to current living arrangement?: Yes Abuse/Neglect [facility]) Physical Abuse: Denies Verbal Abuse: Denies Sexual Abuse: Denies Allergies:  No Known Allergies  ACT Assessment Complete:  Yes:    Educational Status    Risk to Self: Risk to self Suicidal Ideation: Yes-Currently Present Suicidal Intent: No-Not Currently/Within Last 6 Months Is patient at risk for suicide?: Yes Suicidal Plan?: Yes-Currently Present Specify Current Suicidal Plan: " I would use a gun, but I don't have one" Access to Means: No Specify Access to Suicidal Means: pt denies access to means What has been your use of drugs/alcohol within the last 12 months?: alcohol Previous Attempts/Gestures: No How many times?: 0 Other Self Harm Risks: na Triggers for Past Attempts: None known Intentional Self Injurious Behavior: None Family Suicide History: No Recent stressful life event(s): Other (Comment) (marriage issues) Persecutory voices/beliefs?: No Depression: Yes Depression Symptoms: Isolating;Fatigue;Guilt;Loss of interest in usual pleasures;Feeling worthless/self pity;Feeling angry/irritable Substance abuse history and/or treatment for substance abuse?: Yes Suicide prevention information given to non-admitted patients: Not applicable  Risk to Others: Risk to Others Homicidal Ideation: No Thoughts of Harm to Others: No Current Homicidal Intent: No Current Homicidal Plan: No Access to Homicidal Means: No Identified Victim: na History of harm to others?: No Assessment of Violence: None Noted Violent Behavior Description: cooperative Does patient have access to weapons?: No (pt says no but pt was in [employer]) Criminal Charges Pending?: No  Does patient have a court date: No  Abuse: Abuse/Neglect Assessment (Assessment to be complete while patient is alone) Physical Abuse: Denies Verbal Abuse: Denies Sexual Abuse:  Denies Exploitation of patient/patient's resources: Denies Self-Neglect: Denies  Prior Inpatient Therapy: Prior Inpatient Therapy Prior Inpatient Therapy: Yes Prior Therapy Dates: 2012 Prior Therapy Facilty/Provider(s): inpatient in [place] Reason for Treatment: depression   Prior Outpatient Therapy: Prior Outpatient Therapy Prior Outpatient Therapy: Yes Prior Therapy Dates: current [facility] Reason for Treatment: med mgt and optx  Additional Information: Additional Information 1:1 In Past 12 Months?: No CIRT Risk: No Elopement Risk: No Does patient have medical clearance?: Yes                  Objective: Blood pressure 150/74, pulse 74, temperature 98.6 F (37 C), temperature source Oral, resp. rate 18, SpO2 95.00%.There is no height or weight on file to calculate BMI. Results for orders placed during the hospital encounter of 03/24/13 (from the past 72 hour(s))  CBC     Status: Abnormal   Collection Time    03/24/13 12:19 PM      Result Value Range   WBC 8.7  4.0 - 10.5 K/uL   RBC 4.22  4.22 - 5.81 MIL/uL   Hemoglobin 12.8 (*) 13.0 - 17.0 g/dL   HCT 16.1 (*) 09.6 - 04.5 %   MCV 88.4  78.0 - 100.0 fL   MCH 30.3  26.0 - 34.0 pg   MCHC 34.3  30.0 - 36.0 g/dL   RDW 40.9  81.1 - 91.4 %   Platelets 216  150 - 400 K/uL  COMPREHENSIVE METABOLIC PANEL     Status: Abnormal   Collection Time    03/24/13 12:19 PM      Result Value Range   Sodium 123 (*) 135 - 145 mEq/L   Potassium 4.0  3.5 - 5.1 mEq/L   Chloride 85 (*) 96 - 112 mEq/L   CO2 25  19 - 32 mEq/L   Glucose, Bld 116 (*) 70 - 99 mg/dL   BUN 13  6 - 23 mg/dL   Creatinine, Ser 7.82  0.50 - 1.35 mg/dL   Calcium 95.6  8.4 - 21.3 mg/dL   Total Protein 7.8  6.0 - 8.3 g/dL   Albumin 3.9  3.5 - 5.2 g/dL   AST 41 (*) 0 - 37 U/L   ALT 42  0 - 53 U/L   Alkaline Phosphatase 86  39 - 117 U/L   Total Bilirubin 0.3  0.3 - 1.2 mg/dL   GFR calc non Af Amer 68 (*) >90  mL/min   GFR calc Af Amer 79 (*) >90 mL/min   Comment: (NOTE)     The eGFR has been calculated using the CKD EPI equation.     This calculation has not been validated in all clinical situations.     eGFR's persistently <90 mL/min signify possible Chronic Kidney     Disease.  LIPASE, BLOOD     Status: None   Collection Time    03/24/13 12:19 PM      Result Value Range   Lipase 36  11 - 59 U/L  ETHANOL     Status: Abnormal   Collection Time    03/24/13 12:19 PM      Result Value Range   Alcohol, Ethyl (B) 168 (*) 0 - 11 mg/dL   Comment:            LOWEST  DETECTABLE LIMIT FOR     SERUM ALCOHOL IS 11 mg/dL     FOR MEDICAL PURPOSES ONLY  URINALYSIS, ROUTINE W REFLEX MICROSCOPIC     Status: None   Collection Time    03/24/13 12:55 PM      Result Value Range   Color, Urine YELLOW  YELLOW   APPearance CLEAR  CLEAR   Specific Gravity, Urine 1.014  1.005 - 1.030   pH 6.0  5.0 - 8.0   Glucose, UA NEGATIVE  NEGATIVE mg/dL   Hgb urine dipstick NEGATIVE  NEGATIVE   Bilirubin Urine NEGATIVE  NEGATIVE   Ketones, ur NEGATIVE  NEGATIVE mg/dL   Protein, ur NEGATIVE  NEGATIVE mg/dL   Urobilinogen, UA 0.2  0.0 - 1.0 mg/dL   Nitrite NEGATIVE  NEGATIVE   Leukocytes, UA NEGATIVE  NEGATIVE   Comment: MICROSCOPIC NOT DONE ON URINES WITH NEGATIVE PROTEIN, BLOOD, LEUKOCYTES, NITRITE, OR GLUCOSE <1000 mg/dL.  URINE RAPID DRUG SCREEN (HOSP PERFORMED)     Status: None   Collection Time    03/24/13 12:56 PM      Result Value Range   Opiates NONE DETECTED  NONE DETECTED   Cocaine NONE DETECTED  NONE DETECTED   Benzodiazepines NONE DETECTED  NONE DETECTED   Amphetamines NONE DETECTED  NONE DETECTED   Tetrahydrocannabinol NONE DETECTED  NONE DETECTED   Barbiturates NONE DETECTED  NONE DETECTED   Comment:            DRUG SCREEN FOR MEDICAL PURPOSES     ONLY.  IF CONFIRMATION IS NEEDED     FOR ANY PURPOSE, NOTIFY LAB     WITHIN 5 DAYS.                LOWEST DETECTABLE LIMITS     FOR URINE DRUG  SCREEN     Drug Class       Cutoff (ng/mL)     Amphetamine      1000     Barbiturate      200     Benzodiazepine   200     Tricyclics       300     Opiates          300     Cocaine          300     THC              50  POCT I-STAT, CHEM 8     Status: Abnormal   Collection Time    03/24/13  4:08 PM      Result Value Range   Sodium 131 (*) 135 - 145 mEq/L   Potassium 4.6  3.5 - 5.1 mEq/L   Chloride 95 (*) 96 - 112 mEq/L   BUN 16  6 - 23 mg/dL   Creatinine, Ser 1.61 (*) 0.50 - 1.35 mg/dL   Glucose, Bld 096 (*) 70 - 99 mg/dL   Calcium, Ion 0.45  4.09 - 1.30 mmol/L   TCO2 26  0 - 100 mmol/L   Hemoglobin 13.9  13.0 - 17.0 g/dL   HCT 81.1  91.4 - 78.2 %    Current Facility-Administered Medications  Medication Dose Route Frequency Provider Last Rate Last Dose  . albuterol (PROVENTIL HFA;VENTOLIN HFA) 108 (90 BASE) MCG/ACT inhaler 2 puff  2 puff Inhalation Q6H PRN Teressa Lower, NP      . amLODipine (NORVASC) tablet 10 mg  10 mg Oral Daily Teressa Lower, NP   10  mg at 03/25/13 0936  . aspirin EC tablet 81 mg  81 mg Oral Daily Teressa Lower, NP   81 mg at 03/25/13 0936  . carvedilol (COREG) tablet 25 mg  25 mg Oral BID WC Teressa Lower, NP   25 mg at 03/25/13 0854  . chlordiazePOXIDE (LIBRIUM) capsule 25 mg  25 mg Oral Q6H PRN Shuvon Rankin, NP      . chlordiazePOXIDE (LIBRIUM) capsule 25 mg  25 mg Oral QID Shuvon Rankin, NP   25 mg at 03/25/13 1610   Followed by  . chlordiazePOXIDE (LIBRIUM) capsule 25 mg  25 mg Oral TID Shuvon Rankin, NP       Followed by  . [START ON 03/26/2013] chlordiazePOXIDE (LIBRIUM) capsule 25 mg  25 mg Oral BH-qamhs Shuvon Rankin, NP       Followed by  . [START ON 03/27/2013] chlordiazePOXIDE (LIBRIUM) capsule 25 mg  25 mg Oral Daily Shuvon Rankin, NP      . furosemide (LASIX) tablet 20 mg  20 mg Oral BID Teressa Lower, NP   20 mg at 03/25/13 0854  . hydrOXYzine (ATARAX/VISTARIL) tablet 25 mg  25 mg Oral Q6H PRN Shuvon Rankin, NP      .  lisinopril (PRINIVIL,ZESTRIL) tablet 40 mg  40 mg Oral Daily Teressa Lower, NP   40 mg at 03/25/13 0937  . loperamide (IMODIUM) capsule 2-4 mg  2-4 mg Oral PRN Shuvon Rankin, NP      . multivitamin with minerals tablet 1 tablet  1 tablet Oral Daily Shuvon Rankin, NP   1 tablet at 03/25/13 9604  . multivitamin with minerals tablet 1 tablet  1 tablet Oral Daily Teressa Lower, NP      . ondansetron (ZOFRAN-ODT) disintegrating tablet 4 mg  4 mg Oral Q6H PRN Shuvon Rankin, NP      . pantoprazole (PROTONIX) EC tablet 40 mg  40 mg Oral Daily Teressa Lower, NP   40 mg at 03/25/13 0937  . sertraline (ZOLOFT) tablet 100 mg  100 mg Oral Daily Shuvon Rankin, NP   100 mg at 03/25/13 0938  . sertraline (ZOLOFT) tablet 100 mg  100 mg Oral Daily Teressa Lower, NP      . simvastatin (ZOCOR) tablet 20 mg  20 mg Oral q1800 Teressa Lower, NP   20 mg at 03/24/13 1837  . spironolactone (ALDACTONE) tablet 25 mg  25 mg Oral BID Teressa Lower, NP   25 mg at 03/25/13 0938  . thiamine (VITAMIN B-1) tablet 100 mg  100 mg Oral Daily Shuvon Rankin, NP   100 mg at 03/25/13 5409   Current Outpatient Prescriptions  Medication Sig Dispense Refill  . albuterol (PROVENTIL HFA;VENTOLIN HFA) 108 (90 BASE) MCG/ACT inhaler Inhale 2 puffs into the lungs every 6 (six) hours as needed for wheezing.      Marland Kitchen amLODipine (NORVASC) 10 MG tablet Take 10 mg by mouth daily.      Marland Kitchen aspirin EC 81 MG tablet Take 81 mg by mouth daily.      . carvedilol (COREG) 25 MG tablet Take 25 mg by mouth 2 (two) times daily with a meal.      . furosemide (LASIX) 20 MG tablet Take 20 mg by mouth 2 (two) times daily.      Marland Kitchen lisinopril (PRINIVIL,ZESTRIL) 40 MG tablet Take 40 mg by mouth daily.      . Multiple Vitamin (MULTIVITAMIN WITH MINERALS) TABS tablet Take 1 tablet by mouth daily.      Marland Kitchen omeprazole (  PRILOSEC) 20 MG capsule Take 20 mg by mouth daily.      . pravastatin (PRAVACHOL) 40 MG tablet Take 80 mg by mouth at bedtime.      .  sertraline (ZOLOFT) 100 MG tablet Take 100 mg by mouth daily.      Marland Kitchen spironolactone (ALDACTONE) 25 MG tablet Take 25 mg by mouth 2 (two) times daily.        Psychiatric Specialty Exam:     Blood pressure 150/74, pulse 74, temperature 98.6 F (37 C), temperature source Oral, resp. rate 18, SpO2 95.00%.There is no height or weight on file to calculate BMI.  General Appearance: Disheveled  Eye Contact::  Good  Speech:  Clear and Coherent and Normal Rate  Volume:  Normal  Mood:  Anxious and Depressed  Affect:  Depressed  Thought Process:  Circumstantial and Goal Directed  Orientation:  Full (Time, Place, and Person)  Thought Content:  Rumination  Suicidal Thoughts:  No  Homicidal Thoughts:  No  Memory:  Immediate;   Good Recent;   Good  Judgement:  Impaired  Insight:  Lacking  Psychomotor Activity:  None noted at this time  Concentration:  Fair  Recall:  Good  Akathisia:  No  Handed:  Right  AIMS (if indicated):     Assets:  Communication Skills Desire for Improvement Housing Transportation  Sleep:        Treatment Plan Summary: Monitor overnight for alcohol withdrawal  Disposition:  continue Librium protocol. Repeat CMP.  Monitor overnight for alcohol withdrawal symptoms.  Refer to inpatient treatment.   Ancil Linsey, MD 03/25/2013 1:26 PM

## 2013-03-26 ENCOUNTER — Encounter (HOSPITAL_COMMUNITY): Payer: Self-pay | Admitting: *Deleted

## 2013-03-26 ENCOUNTER — Inpatient Hospital Stay (HOSPITAL_COMMUNITY)
Admission: AD | Admit: 2013-03-26 | Discharge: 2013-04-01 | DRG: 897 | Disposition: A | Discharge status: Home / Self Care | Payer: Non-veteran care | Source: Intra-hospital | Attending: Psychiatry | Admitting: Psychiatry

## 2013-03-26 DIAGNOSIS — F1010 Alcohol abuse, uncomplicated: Secondary | ICD-10-CM

## 2013-03-26 DIAGNOSIS — F4310 Post-traumatic stress disorder, unspecified: Secondary | ICD-10-CM | POA: Diagnosis present

## 2013-03-26 DIAGNOSIS — F332 Major depressive disorder, recurrent severe without psychotic features: Secondary | ICD-10-CM | POA: Diagnosis present

## 2013-03-26 DIAGNOSIS — F1020 Alcohol dependence, uncomplicated: Principal | ICD-10-CM | POA: Diagnosis present

## 2013-03-26 DIAGNOSIS — E119 Type 2 diabetes mellitus without complications: Secondary | ICD-10-CM | POA: Diagnosis present

## 2013-03-26 DIAGNOSIS — F411 Generalized anxiety disorder: Secondary | ICD-10-CM | POA: Diagnosis present

## 2013-03-26 DIAGNOSIS — F39 Unspecified mood [affective] disorder: Secondary | ICD-10-CM

## 2013-03-26 DIAGNOSIS — I10 Essential (primary) hypertension: Secondary | ICD-10-CM | POA: Diagnosis present

## 2013-03-26 DIAGNOSIS — F329 Major depressive disorder, single episode, unspecified: Secondary | ICD-10-CM

## 2013-03-26 DIAGNOSIS — E7800 Pure hypercholesterolemia, unspecified: Secondary | ICD-10-CM | POA: Diagnosis present

## 2013-03-26 DIAGNOSIS — Z79899 Other long term (current) drug therapy: Secondary | ICD-10-CM

## 2013-03-26 MED ORDER — PANTOPRAZOLE SODIUM 40 MG PO TBEC
40.0000 mg | DELAYED_RELEASE_TABLET | Freq: Every day | ORAL | Status: DC
Start: 2013-03-26 — End: 2013-04-01
  Administered 2013-03-26 – 2013-03-31 (×6): 40 mg via ORAL
  Filled 2013-03-26 (×8): qty 1

## 2013-03-26 MED ORDER — SIMVASTATIN 5 MG PO TABS
5.0000 mg | ORAL_TABLET | Freq: Every day | ORAL | Status: DC
  Administered 2013-03-26 – 2013-03-31 (×6): 5 mg via ORAL
  Filled 2013-03-26 (×7): qty 1

## 2013-03-26 MED ORDER — FUROSEMIDE 20 MG PO TABS
20.0000 mg | ORAL_TABLET | Freq: Two times a day (BID) | ORAL | Status: DC
  Administered 2013-03-26 – 2013-03-31 (×12): 20 mg via ORAL
  Filled 2013-03-26 (×15): qty 1

## 2013-03-26 MED ORDER — ADULT MULTIVITAMIN W/MINERALS CH
1.0000 | ORAL_TABLET | Freq: Every day | ORAL | Status: DC
  Administered 2013-03-26 – 2013-03-31 (×6): 1 via ORAL
  Filled 2013-03-26 (×8): qty 1

## 2013-03-26 MED ORDER — VITAMIN B-1 100 MG PO TABS
100.0000 mg | ORAL_TABLET | Freq: Every day | ORAL | Status: DC
  Administered 2013-03-27 – 2013-03-31 (×5): 100 mg via ORAL
  Filled 2013-03-26 (×7): qty 1

## 2013-03-26 MED ORDER — LISINOPRIL 20 MG PO TABS
40.0000 mg | ORAL_TABLET | Freq: Every day | ORAL | Status: DC
  Administered 2013-03-26 – 2013-03-31 (×6): 40 mg via ORAL
  Filled 2013-03-26 (×8): qty 1

## 2013-03-26 MED ORDER — CHLORDIAZEPOXIDE HCL 25 MG PO CAPS
25.0000 mg | ORAL_CAPSULE | Freq: Four times a day (QID) | ORAL | Status: AC | PRN
  Administered 2013-03-26: 25 mg via ORAL
  Filled 2013-03-26: qty 1

## 2013-03-26 MED ORDER — ASPIRIN EC 81 MG PO TBEC
81.0000 mg | DELAYED_RELEASE_TABLET | Freq: Every day | ORAL | Status: DC
  Administered 2013-03-26 – 2013-03-31 (×6): 81 mg via ORAL
  Filled 2013-03-26 (×8): qty 1

## 2013-03-26 MED ORDER — CHLORDIAZEPOXIDE HCL 25 MG PO CAPS
25.0000 mg | ORAL_CAPSULE | Freq: Every day | ORAL | Status: AC
  Administered 2013-03-29: 25 mg via ORAL
  Filled 2013-03-26: qty 1

## 2013-03-26 MED ORDER — CHLORDIAZEPOXIDE HCL 25 MG PO CAPS
25.0000 mg | ORAL_CAPSULE | ORAL | Status: AC
  Administered 2013-03-28 (×2): 25 mg via ORAL
  Filled 2013-03-26 (×2): qty 1

## 2013-03-26 MED ORDER — ALUM & MAG HYDROXIDE-SIMETH 200-200-20 MG/5ML PO SUSP: 30.0000 mL | ORAL | Status: DC | PRN

## 2013-03-26 MED ORDER — MAGNESIUM HYDROXIDE 400 MG/5ML PO SUSP: 30.0000 mL | Freq: Every day | ORAL | Status: DC | PRN

## 2013-03-26 MED ORDER — CARVEDILOL 25 MG PO TABS
25.0000 mg | ORAL_TABLET | Freq: Two times a day (BID) | ORAL | Status: DC
  Administered 2013-03-26 – 2013-03-31 (×12): 25 mg via ORAL
  Filled 2013-03-26 (×10): qty 1
  Filled 2013-03-26: qty 28
  Filled 2013-03-26 (×5): qty 1

## 2013-03-26 MED ORDER — LOPERAMIDE HCL 2 MG PO CAPS: 2.0000 mg | ORAL_CAPSULE | ORAL | Status: AC | PRN

## 2013-03-26 MED ORDER — ALBUTEROL SULFATE HFA 108 (90 BASE) MCG/ACT IN AERS: 2.0000 | INHALATION_SPRAY | Freq: Four times a day (QID) | RESPIRATORY_TRACT | Status: DC | PRN

## 2013-03-26 MED ORDER — SERTRALINE HCL 100 MG PO TABS
100.0000 mg | ORAL_TABLET | Freq: Every day | ORAL | Status: DC
  Administered 2013-03-26 – 2013-03-31 (×6): 100 mg via ORAL
  Filled 2013-03-26 (×8): qty 1

## 2013-03-26 MED ORDER — CHLORDIAZEPOXIDE HCL 25 MG PO CAPS
25.0000 mg | ORAL_CAPSULE | Freq: Four times a day (QID) | ORAL | Status: AC
  Administered 2013-03-26 (×4): 25 mg via ORAL
  Filled 2013-03-26 (×4): qty 1

## 2013-03-26 MED ORDER — CHLORDIAZEPOXIDE HCL 25 MG PO CAPS
25.0000 mg | ORAL_CAPSULE | Freq: Three times a day (TID) | ORAL | Status: AC
  Administered 2013-03-27 (×3): 25 mg via ORAL
  Filled 2013-03-26 (×3): qty 1

## 2013-03-26 MED ORDER — ONDANSETRON 4 MG PO TBDP: 4.0000 mg | ORAL_TABLET | Freq: Four times a day (QID) | ORAL | Status: AC | PRN

## 2013-03-26 MED ORDER — SPIRONOLACTONE 25 MG PO TABS
25.0000 mg | ORAL_TABLET | Freq: Two times a day (BID) | ORAL | Status: DC
  Administered 2013-03-26 – 2013-03-31 (×12): 25 mg via ORAL
  Filled 2013-03-26 (×15): qty 1

## 2013-03-26 MED ORDER — THIAMINE HCL 100 MG/ML IJ SOLN: 100.0000 mg | Freq: Once | INTRAMUSCULAR | Status: DC

## 2013-03-26 MED ORDER — HYDROXYZINE HCL 25 MG PO TABS: 25.0000 mg | ORAL_TABLET | Freq: Four times a day (QID) | ORAL | Status: AC | PRN

## 2013-03-26 MED ORDER — ACETAMINOPHEN 325 MG PO TABS
650.0000 mg | ORAL_TABLET | Freq: Four times a day (QID) | ORAL | Status: DC | PRN
Start: 2013-03-26 — End: 2013-04-01
  Administered 2013-03-27 – 2013-03-29 (×3): 650 mg via ORAL
  Filled 2013-03-26 (×3): qty 2

## 2013-03-26 MED ORDER — AMLODIPINE BESYLATE 10 MG PO TABS
10.0000 mg | ORAL_TABLET | Freq: Every day | ORAL | Status: DC
  Administered 2013-03-26 – 2013-03-28 (×3): 10 mg via ORAL
  Filled 2013-03-26 (×5): qty 1

## 2013-03-26 NOTE — Progress Notes (Signed)
Did nt attend

## 2013-03-26 NOTE — [facility] Group Notes (Signed)
[facility] LCSW Group Therapy  03/26/2013 3:11 PM  Type of Therapy:  Group Therapy  Participation Level:  Active  Participation Quality:  Attentive  Affect:  Appropriate  Cognitive:  Alert and Oriented  Insight:  Engaged  Engagement in Therapy:  Engaged  Modes of Intervention:  Discussion, Education, Socialization and Support  Summary of Progress/Problems: MHA Speaker came to talk about his personal journey with substance abuse and addiction. Today's speaker did not show up to group. CSW provided pt with [facility] and [facility]HA information (handout) and reviewed this information in detail. Francis Cardenas shared that he plans to utilize MHA groups in the future and expressed interest in the mental health and substance abuse groups. Francis Cardenas was pleasant and attentive throughout group.  Smart, Francis Cardenas 03/26/2013, 3:11 PM

## 2013-03-26 NOTE — Progress Notes (Signed)
Patient ID: Francis Cardenas, male   DOB: 1951/01/28, 62 y.o.   MRN: [id number] He has been up and to groups interacting with peers and staff. He voiced that his withdrawals were sweating and slight tremors.  He voiced that he could not stand for long periods of time thus he needs to be at the front of the line.

## 2013-03-26 NOTE — Progress Notes (Signed)
Vol admit to the 300 hall requesting help to detox from alcohol.  Pt states he has been drinking since he was 61 yrs old.  He says he drinks almost daily and has to drink so that he does not go into withdrawals.  He says he is tired of living this way and wants help to stop.  He wants to be detoxed close to home, then be sent to a [place] rehab for further treatment.  Pt was in the [employer] for several years. Pt reports medical hx of HTN, hypercholesteremia, BPH, DM, sleep apnea, and prostate cancer.  Pt was pleasant/cooperative with the admission process.  Librium protocol initiated.  Oriented to unit/room.  Safety checks q15 minutes initiated.

## 2013-03-26 NOTE — ED Notes (Signed)
Reported that belongings sheet was blank and only had key tape to sheet. Patient valuables were not documented on sheet. Contents that was locked up were given to patient to take to [facility] per MIchelle, MHT and security officer Wall.

## 2013-03-26 NOTE — Tx Team (Signed)
Initial Interdisciplinary Treatment Plan  PATIENT STRENGTHS: (choose at least two) Ability for insight Average or above average intelligence Capable of independent living Communication skills General fund of knowledge Motivation for treatment/growth Supportive family/friends Work skills  PATIENT STRESSORS: Health problems Marital or family conflict Occupational concerns Substance abuse   PROBLEM LIST: Problem List/Patient Goals Date to be addressed Date deferred Reason deferred Estimated date of resolution  Alcohol abuse      Risk for self harm      Need help to stop drinking once and for all                                           DISCHARGE CRITERIA:  Improved stabilization in mood, thinking, and/or behavior Medical problems require only outpatient monitoring Motivation to continue treatment in a less acute level of care Verbal commitment to aftercare and medication compliance Withdrawal symptoms are absent or subacute and managed without 24-hour nursing intervention  PRELIMINARY DISCHARGE PLAN: Attend aftercare/continuing care group Attend 12-step recovery group Outpatient therapy Return to previous living arrangement  PATIENT/FAMIILY INVOLVEMENT: This treatment plan has been presented to and reviewed with the patient, Francis Cardenas, and/or family member.  The patient and family have been given the opportunity to ask questions and make suggestions.  Jesus Genera [facility] 03/26/2013, 4:52 AM

## 2013-03-26 NOTE — [facility] Suicide Risk Assessment (Signed)
[facility] INPATIENT:  Family/Significant Other Suicide Prevention Education  Suicide Prevention Education:  Contact Attempts: Francis Cardenas (pt's wife) [phone] has been identified by the patient as the family member/significant other with whom the patient will be residing, and identified as the person(s) who will aid the patient in the event of a mental health crisis.  With written consent from the patient, two attempts were made to provide suicide prevention education, prior to and/or following the patient's discharge.  We were unsuccessful in providing suicide prevention education.  A suicide education pamphlet was given to the patient to share with family/significant other.  Date and time of first attempt: 10:45AM 03/26/13 Date and time of second attempt: 2:20PM 03/26/13 (voicemail left)  Smart, Francis Cardenas LCSWA 03/26/2013, 2:23 PM

## 2013-03-26 NOTE — Progress Notes (Signed)
Adult Psychoeducational Group Note  Date:  03/26/2013 Time:  11:03 AM  Group Topic/Focus:  Therapeutic Activity  Participation Level:  Did Not Attend  Participation Quality:  Did Not Attend  Affect:  Did Not Attend  Cognitive:  Did Not Attend  Insight: None  Engagement in Group:  None  Modes of Intervention:  Activity, Discussion, Education and Socialization  Additional Comments:  Pt did not attend the therapeutic activity.  Francis Cardenas 03/26/2013, 11:03 AM

## 2013-03-26 NOTE — Progress Notes (Signed)
D:Pt has been in the bed this afternoon and presents with a mild tremor, moist skin and lights mildly bothering eyes.  A:Offered support, encouragement and 15 minute checks. R:Pt denies si and hi. Safety maintained on the unit.

## 2013-03-26 NOTE — ED Notes (Signed)
Patient discharge no acute distress noted.
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
## 2013-03-26 NOTE — Progress Notes (Signed)
Recreation Therapy Notes  Date: 11.04.2014 Time: 2:30pm Location: [address] Dayroom  Group Topic: Software engineer Activities (AAA)  Behavioral Response: Engaged, Attentive   Affect: Euthymic  Clinical Observations/Feedback: Dog Team: [employer]. Patient interacted appropriately with peer, dog team, and LRT.   Marykay Lex Deakin Lacek, LRT/CTRS  Shaivi Rothschild L 03/26/2013 4:53 PM

## 2013-03-26 NOTE — BHH Suicide Risk Assessment (Signed)
Suicide Risk Assessment  Admission Assessment     Nursing information obtained from:  Patient;Review of record Demographic factors:  Male;Caucasian;Unemployed;Access to firearms Current Mental Status:  NA (Pt SI at ED, but denies now) Loss Factors:  Decline in physical health Historical Factors:  NA Risk Reduction Factors:  Living with another person, especially a relative;Positive social support  CLINICAL FACTORS:   Depression:   Comorbid alcohol abuse/dependence Alcohol/Substance Abuse/Dependencies  COGNITIVE FEATURES THAT CONTRIBUTE TO RISK:  Closed-mindedness Polarized thinking Thought constriction (tunnel vision)    SUICIDE RISK:   Moderate:  Frequent suicidal ideation with limited intensity, and duration, some specificity in terms of plans, no associated intent, good self-control, limited dysphoria/symptomatology, some risk factors present, and identifiable protective factors, including available and accessible social support.  PLAN OF CARE: Supportive approach/coping skills/relapse prevention                              Librium detox protocol                              Reassess and address the co morbidities  I certify that inpatient services furnished can reasonably be expected to improve the patient's condition.  Tresia Revolorio A 03/26/2013, 6:22 PM

## 2013-03-27 DIAGNOSIS — F1994 Other psychoactive substance use, unspecified with psychoactive substance-induced mood disorder: Secondary | ICD-10-CM

## 2013-03-27 NOTE — Progress Notes (Signed)
Adult Psychoeducational Group Note  Date:  03/27/2013 Time:  3:40 PM  Group Topic/Focus:  Coping With Mental Health Crisis:   The purpose of this group is to help patients identify strategies for coping with mental health crisis.  Group discusses possible causes of crisis and ways to manage them effectively. Crisis Planning:   The purpose of this group is to help patients create a crisis plan for use upon discharge or in the future, as needed.  Participation Level:  Active  Participation Quality:  Appropriate and Attentive  Affect:  Appropriate  Cognitive:  Alert and Appropriate  Insight: Appropriate  Engagement in Group:  Engaged  Modes of Intervention:  Discussion and Education  Additional Comments:  Pt was active during this group. Pt shared experiences of different triggers that contributed to his mental crisis. Pt was open in sharing and was very supportive of others during group.  Francis Cardenas 03/27/2013, 3:40 PM

## 2013-03-27 NOTE — [facility] Group Notes (Signed)
[facility] LCSW Group Therapy  03/27/2013 3:28 PM  Type of Therapy:  Group Therapy  Participation Level:  Active  Participation Quality:  Attentive  Affect:  Appropriate  Cognitive:  Alert and Oriented  Insight:  Engaged  Engagement in Therapy:  Engaged  Modes of Intervention:  Discussion, Education, Exploration, Socialization and Support  Summary of Progress/Problems: Emotion Regulation: This group focused on both positive and negative emotion identification and allowed group members to process ways to identify feelings, regulate negative emotions, and find healthy ways to manage internal/external emotions. Group members were asked to reflect on a time when their reaction to an emotion led to a negative outcome and explored how alternative responses using emotion regulation would have benefited them. Group members were also asked to discuss a time when emotion regulation was utilized when a negative emotion was experienced. Perlie was attentive and engaged throughout today's therapy group. He identified "boredom and loneliness" as the two emotions that he struggles with most and often leads to alcohol abuse. Bing explored how drinking serves to "give him something to do and numb the hurt" of feeling bored and alone. Ambers talked about the communication problems with his wife and was open to ideas for how to improve his communication and increase his social network by joining AA, support groups, etc." Kasson shows progress in the group setting and improving insight AEB his ability to openly participate in the group setting and connect how his negative emotions and limited support network fuel his substance abuse.    Smart, Tyrek Lawhorn 03/27/2013, 3:28 PM

## 2013-03-27 NOTE — Progress Notes (Signed)
D: Pt denies SI/HI/AVH. Pt is pleasant and cooperative. Pt states he had a pretty good day, just tired but hope to get to the [place].  A: Pt was offered support and encouragement. Pt was given scheduled medications. Pt was encourage to attend groups. Q 15 minute checks were done for safety.   R: Pt is taking medication. Pt has no complaints.Pt receptive to treatment and safety maintained on unit.

## 2013-03-27 NOTE — Progress Notes (Signed)
Recreation Therapy Notes  Date: 11.04.2014 Time: 3:00pm Location: [address] Dayroom  Group Topic: Communication, Team Building, Problem Solving  Goal Area(s) Addresses:  Patient will effectively work with peer towards shared goal.  Patient will identify skill used to make activity successful.  Patient will identify how skills used during activity can be used to reach post d/c goals.   Behavioral Response: Did not attend.   Jian Hodgman L Issa Luster, LRT/CTRS  Irineo Gaulin L 03/27/2013 8:26 AM 

## 2013-03-27 NOTE — Progress Notes (Signed)
Patient ID: Francis Cardenas, male   DOB: February 19, 1951, 62 y.o.   MRN: [id number]  D: Patient is pleasant and cooperative upon approach. Patient denies SI, HI and A/V hallucinations. Patient states that he has chronic pain all over his body but complains of a headache and requests medication. Patient is seen in the milieu interacting with staff and other patients. Patient witnessed another patient fall in the cafeteria earlier and patient stated, "I have PTSD and that brought up some emotions. Knowing I could not help him was hard." Patient was able to calm down after the incident.  A: Emotional support and encouragement was given to patient. PRN Tylenol was given to patient for pain.   R: Pt. Is receptive to treatment and does not express any other needs at this time. Q15 minute safety checks are maintained. Patient is safe at this time.

## 2013-03-27 NOTE — Progress Notes (Signed)
Patient ID: Francis Cardenas, male   DOB: 03/01/1951, 62 y.o.   MRN: [id number]   Patient received C-PAP machine from home and is using it PRN for naps and at bedtime. Verbal order was put in per Dr. Dub Mikes to allow patient to have C-PAP at those times. Writer was unsure of how to place the order in Epic. Writer contacted [employer] [facility] multiple times for guidance/suggestion but was unable to reach anyone. Patient is safe at this time.

## 2013-03-27 NOTE — Progress Notes (Signed)
[facility] MD Progress Note  03/27/2013 3:53 PM Francis Cardenas  MRN:  [id number] Subjective:  [name] states he had a hard time after another patient collapsed this morning. States he became full of anxiety, panic, it brought him memories, flashbacks of what his had experienced in the past. He is still feeling unstable, light headed. Trying to figure things out.  Diagnosis:   DSM5: Schizophrenia Disorders:  None Obsessive-Compulsive Disorders:  None Trauma-Stressor Disorders:  Posttraumatic Stress Disorder (309.81) Substance/Addictive Disorders:  Alcohol Related Disorder - Severe (303.90) Depressive Disorders:  Major Depressive Disorder - Moderate (296.22)  Axis I: Substance Induced Mood Disorder  ADL's:  Intact  Sleep: Fair  Appetite:  Fair  Suicidal Ideation:  Plan:  denies Intent:  denies Means:  denies Homicidal Ideation:  Plan:  denies Intent:  denies Means:  denies AEB (as evidenced by):  Psychiatric Specialty Exam: Review of Systems  Constitutional: Negative.   HENT: Negative.   Eyes: Negative.   Respiratory: Negative.   Cardiovascular: Negative.   Gastrointestinal: Negative.   Genitourinary: Negative.   Musculoskeletal: Negative.   Skin: Negative.   Neurological: Positive for dizziness.  Endo/Heme/Allergies: Negative.   Psychiatric/Behavioral: Positive for depression and substance abuse. The patient is nervous/anxious.     Blood pressure 140/70, pulse 89, temperature 98 F (36.7 C), temperature source Oral, resp. rate 18, height 5\' 9"  (1.753 m), weight 134.265 kg (296 lb).Body mass index is 43.69 kg/(m^2).  General Appearance: Disheveled  Eye Solicitor::  Fair  Speech:  Clear and Coherent  Volume:  Normal  Mood:  Anxious and worried  Affect:  anxious, worried  Thought Process:  Coherent and Goal Directed  Orientation:  Full (Time, Place, and Person)  Thought Content:  worries, concerns, somatically focused  Suicidal Thoughts:  No  Homicidal Thoughts:  No  Memory:   Immediate;   Fair Recent;   Fair Remote;   Fair  Judgement:  Fair  Insight:  Present  Psychomotor Activity:  Restlessness  Concentration:  Fair  Recall:  Fair  Akathisia:  No  Handed:    AIMS (if indicated):     Assets:  Desire for Improvement  Sleep:  Number of Hours: 4.5   Current Medications: Current Facility-Administered Medications  Medication Dose Route Frequency Provider Last Rate Last Dose  . acetaminophen (TYLENOL) tablet 650 mg  650 mg Oral Q6H PRN Kerry Hough, PA-C   650 mg at 03/27/13 1035  . albuterol (PROVENTIL HFA;VENTOLIN HFA) 108 (90 BASE) MCG/ACT inhaler 2 puff  2 puff Inhalation Q6H PRN Kerry Hough, PA-C      . alum & mag hydroxide-simeth (MAALOX/MYLANTA) 200-200-20 MG/5ML suspension 30 mL  30 mL Oral Q4H PRN Kerry Hough, PA-C      . amLODipine (NORVASC) tablet 10 mg  10 mg Oral Daily Kerry Hough, PA-C   10 mg at 03/27/13 0847  . aspirin EC tablet 81 mg  81 mg Oral Daily Kerry Hough, PA-C   81 mg at 03/27/13 0846  . carvedilol (COREG) tablet 25 mg  25 mg Oral BID WC Kerry Hough, PA-C   25 mg at 03/27/13 0847  . chlordiazePOXIDE (LIBRIUM) capsule 25 mg  25 mg Oral Q6H PRN Kerry Hough, PA-C   25 mg at 03/26/13 0336  . chlordiazePOXIDE (LIBRIUM) capsule 25 mg  25 mg Oral TID Kerry Hough, PA-C   25 mg at 03/27/13 1151   Followed by  . [START ON 03/28/2013] chlordiazePOXIDE (LIBRIUM) capsule 25 mg  25 mg Oral BH-qamhs Kerry Hough, PA-C       Followed by  . [START ON 03/29/2013] chlordiazePOXIDE (LIBRIUM) capsule 25 mg  25 mg Oral Daily Kerry Hough, PA-C      . furosemide (LASIX) tablet 20 mg  20 mg Oral BID Kerry Hough, PA-C   20 mg at 03/27/13 0847  . hydrOXYzine (ATARAX/VISTARIL) tablet 25 mg  25 mg Oral Q6H PRN Kerry Hough, PA-C      . lisinopril (PRINIVIL,ZESTRIL) tablet 40 mg  40 mg Oral Daily Kerry Hough, PA-C   40 mg at 03/27/13 0846  . loperamide (IMODIUM) capsule 2-4 mg  2-4 mg Oral PRN Kerry Hough, PA-C       . magnesium hydroxide (MILK OF MAGNESIA) suspension 30 mL  30 mL Oral Daily PRN Kerry Hough, PA-C      . multivitamin with minerals tablet 1 tablet  1 tablet Oral Daily Kerry Hough, PA-C   1 tablet at 03/27/13 0847  . ondansetron (ZOFRAN-ODT) disintegrating tablet 4 mg  4 mg Oral Q6H PRN Kerry Hough, PA-C      . pantoprazole (PROTONIX) EC tablet 40 mg  40 mg Oral Daily Kerry Hough, PA-C   40 mg at 03/27/13 0846  . sertraline (ZOLOFT) tablet 100 mg  100 mg Oral Daily Kerry Hough, PA-C   100 mg at 03/27/13 0846  . simvastatin (ZOCOR) tablet 5 mg  5 mg Oral q1800 Kerry Hough, PA-C   5 mg at 03/26/13 1836  . spironolactone (ALDACTONE) tablet 25 mg  25 mg Oral BID Kerry Hough, PA-C   25 mg at 03/27/13 0846  . thiamine (B-1) injection 100 mg  100 mg Intramuscular Once [employer], PA-C      . thiamine (VITAMIN B-1) tablet 100 mg  100 mg Oral Daily Kerry Hough, PA-C   100 mg at 03/27/13 1610    Lab Results: No results found for this or any previous visit (from the past 48 hour(s)).  Physical Findings: AIMS: Facial and Oral Movements Muscles of Facial Expression: None, normal Lips and Perioral Area: None, normal Jaw: None, normal Tongue: None, normal,Extremity Movements Upper (arms, wrists, hands, fingers): None, normal Lower (legs, knees, ankles, toes): None, normal, Trunk Movements Neck, shoulders, hips: None, normal, Overall Severity Severity of abnormal movements (highest score from questions above): None, normal Incapacitation due to abnormal movements: None, normal Patient's awareness of abnormal movements (rate only patient's report): No Awareness, Dental Status Current problems with teeth and/or dentures?: No Does patient usually wear dentures?: No  CIWA:  CIWA-Ar Total: 2 COWS:     Treatment Plan Summary: Daily contact with patient to assess and evaluate symptoms and progress in treatment Medication management  Plan: Supportive approach/coping  skills/relapse prevention           Continue to detox            Reassess and address treatment for the co morbidities  Medical Decision Making Problem Points:  Review of psycho-social stressors (1) Data Points:  Review of medication regiment & side effects (2)  I certify that inpatient services furnished can reasonably be expected to improve the patient's condition.   Keiton Cosma A 03/27/2013, 3:53 PM

## 2013-03-28 DIAGNOSIS — F411 Generalized anxiety disorder: Secondary | ICD-10-CM

## 2013-03-28 MED ORDER — HYDROXYZINE HCL 25 MG PO TABS
25.0000 mg | ORAL_TABLET | Freq: Every evening | ORAL | Status: DC | PRN
  Administered 2013-03-28 – 2013-03-31 (×3): 25 mg via ORAL
  Filled 2013-03-28 (×11): qty 1

## 2013-03-28 NOTE — Progress Notes (Signed)
Patient ID: Francis Cardenas, male   DOB: 1951-01-11, 62 y.o.   MRN: [id number] D: Patient stated he had a good day and slept well last night. Pt mood/affect appeared depressed.  Pt denies SI/HI/AVH. Pt c/o slight tremors in hands but no other withdrawals. Pt attended evening karaoke group, participated and was supportive of peers. Pt denies any needs or concerns.  Cooperative with assessment. No acute distressed noted at this time.   A: Met with pt 1:1. Medications administered as prescribed. Writer encouraged pt to discuss feelings. Pt encouraged to come to staff with any question or concerns.   R: Patient remains safe. He is complaint with medications and denies any adverse reaction. Continue current POC.

## 2013-03-28 NOTE — Progress Notes (Signed)
Patient ID: Francis Cardenas, male   DOB: 03/31/1951, 62 y.o.   MRN: [id number]  D: Patient presents with depressed mood and affect. Patient denies SI/HI and A/V hallucinations. Patient stated that he was having some pain and requested PRN medication. Patient is pleasant and interacting with staff and other patients.   A: Patient received PRN Tylenol from Clinical research associate. Encouragement and emotional support was given to patient to continue going to groups.   R: Patient is receptive and cooperative. Q15 minute safety checks are maintained and patient is safe at this time.

## 2013-03-28 NOTE — ED Provider Notes (Signed)
Medical screening examination/treatment/procedure(s) were performed by non-physician practitioner and as supervising physician I was immediately available for consultation/collaboration.  EKG Interpretation   None        Lenora Gomes, MD 03/28/13 1302 

## 2013-03-28 NOTE — Progress Notes (Signed)
Recreation Therapy Notes  Date: 11.06.2014  Time: 3:00pm Location: [address] Dayroom  Group Topic: Time Management   Goal Area(s) Addresses:  Patient will identify current use of time.  Patient will identify activities they would like to add to current schedule.    Behavioral Response: Sharing  Intervention: Art  Activity: Patient asked to draw a clock with 24 hours. Using this clock patient was asked to identify how they spend their time, designating each hour to a specific task.   Education: Time Management, Discharge Planning  Education Outcome: Needs additional education  Clinical Observations/Feedback: Patient actively engaged in group activity, identifying how he spends his time on a daily basis. Patient shared that he spends most of his days sleeping or drinking. Patient additionally shared various stories about his life and how his drinking has effected his life. While patient shared various stories, he was often was off-topic and would repeatedly return the conversation to his topic even with LRT attempts to redirect discussion back to group topic.   Marykay Lex Abcde Oneil, LRT/CTRS  Jearl Klinefelter 03/28/2013 4:32 PM

## 2013-03-28 NOTE — [facility] Group Notes (Signed)
[facility] LCSW Group Therapy  03/28/2013  1:15 PM   Type of Therapy:  Group Therapy  Participation Level:  Active  Participation Quality:  Appropriate and Attentive  Affect:  Appropriate and Calm  Cognitive:  Alert and Appropriate  Insight:  Developing/Improving and Engaged  Engagement in Therapy:  Developing/Improving and Engaged  Modes of Intervention:  Clarification, Confrontation, Discussion, Education, Exploration, Limit-setting, Orientation, Problem-solving, Rapport Building, Dance movement psychotherapist, Socialization and Support  Summary of Progress/Problems: The topic for group was balance in life.  Today's group focused on defining balance in one's own words, identifying things that can knock one off balance, and exploring healthy ways to maintain balance in life. Group members were asked to provide an example of a time when they felt off balance, describe how they handled that situation,and process healthier ways to regain balance in the future. Group members were asked to share the most important tool for maintaining balance that they learned while at [facility] and how they plan to apply this method after discharge.  Pt discussed finding a balance between his faith and having the will to make changes himself.  Pt shared that he doesn't feel worthy of God sometimes.  Pt was able to process his feelings of worthlessness and how this impacts his life, leading to his drinking.  Pt actively participated and was engaged in group discussion.    Reyes Ivan, LCSW 03/28/2013 2:31 PM

## 2013-03-28 NOTE — Progress Notes (Signed)
Adult Psychoeducational Group Note  Date:  03/28/2013 Time:  1:53 PM  Group Topic/Focus:  Building Self Esteem:   The Focus of this group is helping patients become aware of the effects of self-esteem on their lives, the things they and others do that enhance or undermine their self-esteem, seeing the relationship between their level of self-esteem and the choices they make and learning ways to enhance self-esteem.  Participation Level:  Active  Participation Quality:  Appropriate, Attentive and Sharing  Affect:  Appropriate  Cognitive:  Alert and Appropriate  Insight: Good  Engagement in Group:  Engaged  Modes of Intervention:  Activity, Discussion, Exploration, Socialization and Support  Additional Comments:  Pt came to group and shared that alcohol and isolation are negatively effecting his self-esteem. Pt plans on changing this by going to [facility] to further his treatment and going to AA meetings and finding a pain clinic.   Cathlean Cower 03/28/2013, 1:53 PM

## 2013-03-28 NOTE — Progress Notes (Signed)
[facility] MD Progress Note  03/28/2013 4:39 PM ESWIN WORRELL  MRN:  [id number] Subjective: Continue to be detox. states that he is concerned about the on going conflict with his wife. He is anticipating being able to go to rehab. After he comes back states that if things do not change he will consider separation. He understands that his persistent drinking has affected the relationship and does not know if it is salvable. He also states that he understands that he has to create a life for himself, a sense of purpose that he does not have now.  Diagnosis:   DSM5: Schizophrenia Disorders:  none Obsessive-Compulsive Disorders:  none Trauma-Stressor Disorders:  Posttraumatic Stress Disorder (309.81) Substance/Addictive Disorders:  Alcohol Related Disorder - Severe (303.90) Depressive Disorders:  Major Depressive Disorder - Moderate (296.22)  Axis I: Anxiety Disorder NOS  ADL's:  Intact  Sleep: Fair  Appetite:  Fair  Suicidal Ideation:  Plan:  denies Intent:  denies Means:  denies Homicidal Ideation:  Plan:  denies Intent:  denies Means:  denies AEB (as evidenced by):  Psychiatric Specialty Exam: Review of Systems  Constitutional: Negative.   HENT: Negative.   Eyes: Negative.   Respiratory: Negative.   Cardiovascular: Negative.   Genitourinary: Negative.   Musculoskeletal: Negative.   Skin: Negative.   Neurological: Positive for dizziness.  Endo/Heme/Allergies: Negative.   Psychiatric/Behavioral: Positive for depression and substance abuse. The patient is nervous/anxious.     Blood pressure 117/69, pulse 83, temperature 98.8 F (37.1 C), temperature source Oral, resp. rate 20, height 5\' 9"  (1.753 m), weight 134.265 kg (296 lb).Body mass index is 43.69 kg/(m^2).  General Appearance: Fairly Groomed  Patent attorney::  Fair  Speech:  Clear and Coherent  Volume:  varies  Mood:  Anxious, Depressed and worried  Affect:  sad, anxious  Thought Process:  Coherent and Goal  DirectedCircunstantial  Orientation:  Full (Time, Place, and Person)  Thought Content:  worries, concerns  Suicidal Thoughts:  No  Homicidal Thoughts:  No  Memory:  Immediate;   Fair Recent;   Fair Remote;   Fair  Judgement:  Fair  Insight:  Present  Psychomotor Activity:  Restlessness  Concentration:  Fair  Recall:  Fair  Akathisia:  No  Handed:    AIMS (if indicated):     Assets:  Desire for Improvement Financial Resources/Insurance Housing  Sleep:  Number of Hours: 6.75   Current Medications: Current Facility-Administered Medications  Medication Dose Route Frequency Provider Last Rate Last Dose  . acetaminophen (TYLENOL) tablet 650 mg  650 mg Oral Q6H PRN Kerry Hough, PA-C   650 mg at 03/28/13 0811  . albuterol (PROVENTIL HFA;VENTOLIN HFA) 108 (90 BASE) MCG/ACT inhaler 2 puff  2 puff Inhalation Q6H PRN Kerry Hough, PA-C      . alum & mag hydroxide-simeth (MAALOX/MYLANTA) 200-200-20 MG/5ML suspension 30 mL  30 mL Oral Q4H PRN Kerry Hough, PA-C      . amLODipine (NORVASC) tablet 10 mg  10 mg Oral Daily Kerry Hough, PA-C   10 mg at 03/28/13 0811  . aspirin EC tablet 81 mg  81 mg Oral Daily Kerry Hough, PA-C   81 mg at 03/28/13 7829  . carvedilol (COREG) tablet 25 mg  25 mg Oral BID WC Kerry Hough, PA-C   25 mg at 03/28/13 5621  . chlordiazePOXIDE (LIBRIUM) capsule 25 mg  25 mg Oral Q6H PRN Kerry Hough, PA-C   25 mg at 03/26/13 0336  .  chlordiazePOXIDE (LIBRIUM) capsule 25 mg  25 mg Oral BH-qamhs Spencer Nancy Fetter, PA-C   25 mg at 03/28/13 1610   Followed by  . [START ON 03/29/2013] chlordiazePOXIDE (LIBRIUM) capsule 25 mg  25 mg Oral Daily Kerry Hough, PA-C      . furosemide (LASIX) tablet 20 mg  20 mg Oral BID Kerry Hough, PA-C   20 mg at 03/28/13 9604  . hydrOXYzine (ATARAX/VISTARIL) tablet 25 mg  25 mg Oral Q6H PRN Kerry Hough, PA-C      . lisinopril (PRINIVIL,ZESTRIL) tablet 40 mg  40 mg Oral Daily Kerry Hough, PA-C   40 mg at 03/28/13  0810  . loperamide (IMODIUM) capsule 2-4 mg  2-4 mg Oral PRN Kerry Hough, PA-C      . magnesium hydroxide (MILK OF MAGNESIA) suspension 30 mL  30 mL Oral Daily PRN Kerry Hough, PA-C      . multivitamin with minerals tablet 1 tablet  1 tablet Oral Daily Kerry Hough, PA-C   1 tablet at 03/28/13 5409  . ondansetron (ZOFRAN-ODT) disintegrating tablet 4 mg  4 mg Oral Q6H PRN Kerry Hough, PA-C      . pantoprazole (PROTONIX) EC tablet 40 mg  40 mg Oral Daily Kerry Hough, PA-C   40 mg at 03/28/13 8119  . sertraline (ZOLOFT) tablet 100 mg  100 mg Oral Daily Kerry Hough, PA-C   100 mg at 03/28/13 1478  . simvastatin (ZOCOR) tablet 5 mg  5 mg Oral q1800 Kerry Hough, PA-C   5 mg at 03/27/13 1818  . spironolactone (ALDACTONE) tablet 25 mg  25 mg Oral BID Kerry Hough, PA-C   25 mg at 03/28/13 2956  . thiamine (B-1) injection 100 mg  100 mg Intramuscular Once [employer], PA-C      . thiamine (VITAMIN B-1) tablet 100 mg  100 mg Oral Daily Kerry Hough, PA-C   100 mg at 03/28/13 2130    Lab Results: No results found for this or any previous visit (from the past 48 hour(s)).  Physical Findings: AIMS: Facial and Oral Movements Muscles of Facial Expression: None, normal Lips and Perioral Area: None, normal Jaw: None, normal Tongue: None, normal,Extremity Movements Upper (arms, wrists, hands, fingers): None, normal Lower (legs, knees, ankles, toes): None, normal, Trunk Movements Neck, shoulders, hips: None, normal, Overall Severity Severity of abnormal movements (highest score from questions above): None, normal Incapacitation due to abnormal movements: None, normal Patient's awareness of abnormal movements (rate only patient's report): No Awareness, Dental Status Current problems with teeth and/or dentures?: No Does patient usually wear dentures?: No  CIWA:  CIWA-Ar Total: 1 COWS:     Treatment Plan Summary: Daily contact with patient to assess and evaluate  symptoms and progress in treatment Medication management  Plan: Supportive approach/coping skills/relapse prevention           CBT; mindfulness            Reasses and address the co morbidities  Medical Decision Making Problem Points:  Review of psycho-social stressors (1) Data Points:  Review of medication regiment & side effects (2)  I certify that inpatient services furnished can reasonably be expected to improve the patient's condition.   Demonta Wombles A 03/28/2013, 4:39 PM

## 2013-03-28 NOTE — Progress Notes (Signed)
D: Patient denies SI/HI/AVH. Patient rates hopelessness as 6,  depression as 5, and anxiety as 5.  Patient affect is anxious. Mood is anxious.  Pt states, "I'm worried about the longevity of my consumption.  My fear is that I won't be able to stop.  My plan is to go the [place].  Patient did attend evening group. Patient visible on the milieu. No distress noted. A: Support and encouragement offered. Scheduled medications given to pt. Q 15 min checks continued for patient safety. R: Patient receptive. Patient remains safe on the unit.

## 2013-03-28 NOTE — [facility] Group Notes (Signed)
The focus of this group is to discuss various aspects of wellness, balancing those aspects and exploring ways to increase the ability to experience wellness.  Patients will create a wellness toolbox for use upon discharge.  Pt attended the Wellness Group with the RN this morning. Pt participated during the group discussion and stated that he wanted "to get better with detox and to start feeling better."  -Dossie Arbour, MHT

## 2013-03-29 DIAGNOSIS — F4310 Post-traumatic stress disorder, unspecified: Secondary | ICD-10-CM

## 2013-03-29 DIAGNOSIS — F329 Major depressive disorder, single episode, unspecified: Secondary | ICD-10-CM

## 2013-03-29 MED ORDER — IBUPROFEN 600 MG PO TABS: 600.0000 mg | ORAL_TABLET | Freq: Four times a day (QID) | ORAL | Status: DC | PRN

## 2013-03-29 NOTE — Tx Team (Signed)
Interdisciplinary Treatment Plan Update (Adult)  Date: 03/29/2013  Time Reviewed:  9:45 AM  Progress in Treatment: Attending groups: Yes Participating in groups:  Yes Taking medication as prescribed:  Yes Tolerating medication:  Yes Family/Significant othe contact made: No, attempts made Patient understands diagnosis:  Yes Discussing patient identified problems/goals with staff:  Yes Medical problems stabilized or resolved:  Yes Denies suicidal/homicidal ideation: Yes Issues/concerns per patient self-inventory:  Yes Other:  New problem(s) identified: N/A  Discharge Plan or Barriers: Pt will follow up at [facility] for further inpatient treatment on Monday and [facility] for medication management and therapy.    Reason for Continuation of Hospitalization: Anxiety Depression Detox Medication Stabilization  Comments: N/A  Estimated length of stay: 2 days, d/c Monday  For review of initial/current patient goals, please see plan of care.  Attendees: Patient:     Family:     Physician:  Dr. Dub Mikes 03/29/2013 10:06 AM   Nursing:   Lamount Cranker, RN 03/29/2013 10:06 AM   Clinical Social Worker:  Reyes Ivan, LCSW 03/29/2013 10:06 AM   Other: Onnie Boer, RN case manager 03/29/2013 10:06 AM   Other:   Other:   Other:     Other:    Other:    Other:    Other:    Other:    Other:     Scribe for Treatment Team:   Carmina Miller, 03/29/2013 , 10:06 AM

## 2013-03-29 NOTE — Progress Notes (Signed)
Adult Psychoeducational Group Note  Date:  03/29/2013 Time:  2:49 PM  Group Topic/Focus:  Relapse Prevention Planning:   The focus of this group is to define relapse and discuss the need for planning to combat relapse.  Participation Level:  Active  Participation Quality:  Attentive and Sharing  Affect:  Appropriate  Cognitive:  Appropriate  Insight: Good  Engagement in Group:  Engaged  Modes of Intervention:  Discussion and Support  Additional Comments:  Pt offered support and participated during the discussion of relapse prevention.  Reynolds Bowl 03/29/2013, 2:49 PM

## 2013-03-29 NOTE — Progress Notes (Signed)
(  For Discharge on Monday) [facility] Adult Case Management Discharge Plan :  Will you be returning to the same living situation after discharge: Yes,  can return home after treatment At discharge, do you have transportation home?:Yes,  has own car here to drive to [facility] Do you have the ability to pay for your medications:Yes,  access to meds  Release of information consent forms completed and in the chart;  Patient's signature needed at discharge.  Patient to Follow up at: Follow-up Information   Follow up with [facility] On 04/01/2013. (Arrive at 8:00 am promptly for admission screening. Bring all medication, ID and belongings. )    Contact information:   [address] [place], [place] [address] Phone: [phone] Fax: [phone]      [facility]. (Call to schedule follow-up/medication management after discharge from [facility]. )    Contact information:   [address] [place], [place] [address] Phone: [phone] Fax: [phone]      Patient denies SI/HI:   Yes,  denies SI/HI    Safety Planning and Suicide Prevention discussed:  Yes,  discussed with pt, unable to reach pt's wife.  See suicide prevention education note.   Carmina Miller 03/29/2013, 10:31 AM

## 2013-03-29 NOTE — [facility] Group Notes (Signed)
Adult Psychoeducational Group Note  Date:  03/29/2013 Time:  10:42 PM  Group Topic/Focus:  AA Meeting  Participation Level:  Did Not Attend  Participation Quality:  None  Affect:  None  Cognitive:  None  Insight: None  Engagement in Group:  None  Modes of Intervention:  Discussion and Education  Additional Comments:  Argelio did not attend group.  Caroll Rancher A 03/29/2013, 10:42 PM

## 2013-03-29 NOTE — Progress Notes (Signed)
[facility] MD Progress Note  03/29/2013 4:18 PM Francis Cardenas  MRN:  [id number] Subjective:  Francis Cardenas states that he had a call from his wife and that this was a very graceful thing for her to do as she had to get resourceful in tracking him down. Marland Kitchen He was surprised that she did it considering how difficult their communication has been. He is more optimistic today about the relationship. He is still not feeling completely well. States he has been drinking so much for so long starting int the morning that he knows it is going to take a while to get some normality back. States that is why he needs to go to rehab in order to get more stable before he is out there on his own dealing with the stress of every day Diagnosis:   DSM5: Schizophrenia Disorders:  none Obsessive-Compulsive Disorders:  none Trauma-Stressor Disorders:  Posttraumatic Stress Disorder (309.81) Substance/Addictive Disorders:  Alcohol Related Disorder - Severe (303.90) Depressive Disorders:  Major Depressive Disorder - Moderate (296.22)  Axis I: Depressive Disorder NOS  ADL's:  Intact  Sleep: Fair  Appetite:  Fair  Suicidal Ideation:  Plan:  denies Intent:  denies Means:  denies Homicidal Ideation:  Plan:  denies Intent:  denies Means:  denies AEB (as evidenced by):  Psychiatric Specialty Exam: Review of Systems  Constitutional: Negative.   HENT: Negative.   Eyes: Negative.   Respiratory: Negative.   Cardiovascular: Negative.   Gastrointestinal: Negative.   Genitourinary: Negative.   Musculoskeletal: Negative.   Skin: Negative.   Neurological: Positive for dizziness.  Endo/Heme/Allergies: Negative.   Psychiatric/Behavioral: Positive for depression and substance abuse. The patient is nervous/anxious.     Blood pressure 101/55, pulse 68, temperature 97.2 F (36.2 C), temperature source Oral, resp. rate 18, height 5\' 9"  (1.753 m), weight 134.265 kg (296 lb).Body mass index is 43.69 kg/(m^2).  General Appearance: Fairly  Groomed  Patent attorney::  Fair  Speech:  Clear and Coherent  Volume:  Normal  Mood:  Anxious and worried  Affect:  anxious, worried  Thought Process:  Circumstantial and Coherent  Orientation:  Full (Time, Place, and Person)  Thought Content:  symptoms, worries, concerns  Suicidal Thoughts:  No  Homicidal Thoughts:  No  Memory:  Immediate;   Fair  Judgement:  Fair  Insight:  Present  Psychomotor Activity:  Restlessness  Concentration:  Fair  Recall:  Fair  Akathisia:  No  Handed:    AIMS (if indicated):     Assets:  Desire for Improvement  Sleep:  Number of Hours: 6.75   Current Medications: Current Facility-Administered Medications  Medication Dose Route Frequency Provider Last Rate Last Dose  . acetaminophen (TYLENOL) tablet 650 mg  650 mg Oral Q6H PRN Kerry Hough, PA-C   650 mg at 03/29/13 1324  . albuterol (PROVENTIL HFA;VENTOLIN HFA) 108 (90 BASE) MCG/ACT inhaler 2 puff  2 puff Inhalation Q6H PRN Kerry Hough, PA-C      . alum & mag hydroxide-simeth (MAALOX/MYLANTA) 200-200-20 MG/5ML suspension 30 mL  30 mL Oral Q4H PRN Kerry Hough, PA-C      . aspirin EC tablet 81 mg  81 mg Oral Daily Kerry Hough, PA-C   81 mg at 03/29/13 0831  . carvedilol (COREG) tablet 25 mg  25 mg Oral BID WC Kerry Hough, PA-C   25 mg at 03/29/13 0830  . furosemide (LASIX) tablet 20 mg  20 mg Oral BID Kerry Hough, PA-C  20 mg at 03/29/13 0830  . hydrOXYzine (ATARAX/VISTARIL) tablet 25 mg  25 mg Oral QHS,MR X 1 Larena Sox, MD   25 mg at 03/28/13 2144  . ibuprofen (ADVIL,MOTRIN) tablet 600 mg  600 mg Oral Q6H PRN Fransisca Kaufmann, NP      . lisinopril (PRINIVIL,ZESTRIL) tablet 40 mg  40 mg Oral Daily Kerry Hough, PA-C   40 mg at 03/29/13 0830  . magnesium hydroxide (MILK OF MAGNESIA) suspension 30 mL  30 mL Oral Daily PRN Kerry Hough, PA-C      . multivitamin with minerals tablet 1 tablet  1 tablet Oral Daily Kerry Hough, PA-C   1 tablet at 03/29/13 0830  . pantoprazole  (PROTONIX) EC tablet 40 mg  40 mg Oral Daily Kerry Hough, PA-C   40 mg at 03/29/13 0830  . sertraline (ZOLOFT) tablet 100 mg  100 mg Oral Daily Kerry Hough, PA-C   100 mg at 03/29/13 0830  . simvastatin (ZOCOR) tablet 5 mg  5 mg Oral q1800 Kerry Hough, PA-C   5 mg at 03/28/13 1826  . spironolactone (ALDACTONE) tablet 25 mg  25 mg Oral BID Kerry Hough, PA-C   25 mg at 03/29/13 0830  . thiamine (B-1) injection 100 mg  100 mg Intramuscular Once Spencer E Simon, PA-C      . thiamine (VITAMIN B-1) tablet 100 mg  100 mg Oral Daily Kerry Hough, PA-C   100 mg at 03/29/13 0830    Lab Results:  Results for orders placed during the hospital encounter of 03/26/13 (from the past 48 hour(s))  GLUCOSE, CAPILLARY     Status: Abnormal   Collection Time    03/29/13 11:50 AM      Result Value Range   Glucose-Capillary 110 (*) 70 - 99 mg/dL   Comment 1 Notify RN      Physical Findings: AIMS: Facial and Oral Movements Muscles of Facial Expression: None, normal Lips and Perioral Area: None, normal Jaw: None, normal Tongue: None, normal,Extremity Movements Upper (arms, wrists, hands, fingers): None, normal Lower (legs, knees, ankles, toes): None, normal, Trunk Movements Neck, shoulders, hips: None, normal, Overall Severity Severity of abnormal movements (highest score from questions above): None, normal Incapacitation due to abnormal movements: None, normal Patient's awareness of abnormal movements (rate only patient's report): No Awareness, Dental Status Current problems with teeth and/or dentures?: No Does patient usually wear dentures?: No  CIWA:  CIWA-Ar Total: 0 COWS:     Treatment Plan Summary: Daily contact with patient to assess and evaluate symptoms and progress in treatment Medication management  Plan: Supportive approach/coping skills/relapse prevention            Optimize treatment with psychotropics            [id number]  Medical Decision Making Problem  Points:  Review of psycho-social stressors (1) Data Points:  Review of new medications or change in dosage (2)  I certify that inpatient services furnished can reasonably be expected to improve the patient's condition.   Francis Cardenas A 03/29/2013, 4:18 PM

## 2013-03-29 NOTE — Progress Notes (Signed)
Patient observed in bed. Patient denies SI, HI, AVH. Patient drowsy. Patient stated he was "very tired" and had experienced dizziness throughout the day. Patient states that he is currently having no feelings of depression or anxiety. "The feelings come and go." Patient stated that he attended day groups, remained in bed for the evening.  Patient reminded of fall safety precautions. Encouragement offered. Medications given as ordered.  Patient remains safe on the unit, Q 15 minute checks continue.

## 2013-03-29 NOTE — [facility] Group Notes (Signed)
[facility] LCSW Aftercare Discharge Planning Group Note   03/29/2013 8:45 AM  Participation Quality:  Alert and Appropriate   Mood/Affect:  Appropriate and Calm  Depression Rating:  0  Anxiety Rating:  0  Thoughts of Suicide:  Pt denies SI/HI  Will you contract for safety?   Yes  Current AVH:  Pt denies  Plan for Discharge/Comments:  Pt attended discharge planning group and actively participated in group.  CSW provided pt with today's workbook.   Pt will follow up at [facility] for further inpatient treatment on Monday.  Pt can also follow up at [facility] for medication management and therapy.    Transportation Means: Pt reports access to transportation  Supports: No supports mentioned at this time  Francis Ivan, LCSW 03/29/2013 9:52 AM

## 2013-03-29 NOTE — [facility] Group Notes (Signed)
[facility] LCSW Group Therapy  03/29/2013 1:15 PM   Type of Therapy:  Group Therapy  Participation Level:  Did Not Attend  Saiya Crist Horton, LCSW 03/29/2013 2:51 PM   

## 2013-03-29 NOTE — Progress Notes (Signed)
Patient ID: Francis Cardenas, male   DOB: Sep 29, 1950, 62 y.o.   MRN: [id number] D. Patient presents with depressed mood, affect congruent. Patient states '' I'm feeling okay really today, I mean I've had a little tremor and anxiety but it's getting better. '' Patient completed self inventory and rates sleep fair, he endorses continued agitation and tremor as withdrawal symptoms. Patient later complained of back pain - prn medication given and pt states '' tylenol just isn't gonna cut it'' Discussed above with MD. A. Support and encouragement provided. R. Patient remains calm and cooperative at this time. Will continue to monitor q 15 minutes for safety.

## 2013-03-30 DIAGNOSIS — F1010 Alcohol abuse, uncomplicated: Secondary | ICD-10-CM

## 2013-03-30 MED ORDER — SALINE SPRAY 0.65 % NA SOLN
1.0000 | NASAL | Status: DC | PRN
  Filled 2013-03-30: qty 44

## 2013-03-30 NOTE — Progress Notes (Signed)
[facility] MD Progress Note  03/30/2013 5:14 PM Francis Cardenas  MRN:  [id number]  Subjective:  Patient complained of insomnia--Rozerem ordered, appetite good, denies depression, appetite is "good".  He is going to [facility] on Monday and worries about maintaining his sobriety when he returns home when he gets bored.  Encouraged him to make a list of things to do when he wants to drink and attend AA for support with a sponsor. 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 Diabetes mellitus without complication   . Hypertension   . Hypercholesteremia   . BPH (benign prostatic hyperplasia)   . Cancer     prostate   Axis IV: other psychosocial or environmental problems, problems related to social environment and problems with primary support group Axis V: 41-50 serious symptoms  ADL's:  Intact  Sleep: Poor  Appetite:  Good  Suicidal Ideation:  Denies  Homicidal Ideation:  Denies   Psychiatric Specialty Exam: Review of Systems  Constitutional: Negative.   HENT: Negative.   Eyes: Negative.   Respiratory: Negative.   Cardiovascular: Negative.   Gastrointestinal: Negative.   Genitourinary: Negative.   Musculoskeletal: Negative.   Skin: Negative.   Neurological: Negative.   Endo/Heme/Allergies: Negative.   Psychiatric/Behavioral: Positive for substance abuse. The patient is nervous/anxious and has insomnia.     Blood pressure 130/72, pulse 71, temperature 97.8 F (36.6 C), temperature source Oral, resp. rate 18, height 5\' 9"  (1.753 m), weight 296 lb (134.265 kg).Body mass index is 43.69 kg/(m^2).  General Appearance: Casual  Eye Contact::  Fair  Speech:  Normal Rate  Volume:  Normal  Mood:  Anxious  Affect:  Congruent  Thought Process:  Coherent   Orientation:  Full (Time, Place, and Person)  Thought Content:  WDL  Suicidal Thoughts:  No  Homicidal Thoughts:  No  Memory:  Immediate;   Fair Recent;   Fair Remote;   Fair  Judgement:  Fair  Insight:  Fair  Psychomotor Activity:  Decreased  Concentration:  Fair  Recall:  Fair  Akathisia:  No  Handed:  Right  AIMS (if indicated):     Assets:  Leisure Time Resilience Social Support  Sleep:  Number of Hours: 6.5   Current Medications: Current Facility-Administered Medications  Medication Dose Route Frequency Provider Last Rate Last Dose  . acetaminophen (TYLENOL) tablet 650 mg  650 mg Oral Q6H PRN Kerry Hough, PA-C   650 mg at 03/29/13 1324  . albuterol (PROVENTIL HFA;VENTOLIN HFA) 108 (90 BASE) MCG/ACT inhaler 2 puff  2 puff Inhalation Q6H PRN Kerry Hough, PA-C      . alum & mag hydroxide-simeth (MAALOX/MYLANTA) 200-200-20 MG/5ML suspension 30 mL  30 mL Oral Q4H PRN Kerry Hough, PA-C      . aspirin EC tablet 81 mg  81 mg Oral Daily Kerry Hough, PA-C   81 mg at 03/30/13 0811  . carvedilol (COREG) tablet 25 mg  25 mg Oral BID WC Kerry Hough, PA-C   25 mg at 03/30/13 7829  . furosemide (LASIX) tablet 20 mg  20 mg Oral BID Kerry Hough, PA-C   20 mg at 03/30/13 0810  . hydrOXYzine (ATARAX/VISTARIL) tablet 25 mg  25 mg Oral QHS,MR X 1 Larena Sox, MD   25  mg at 03/29/13 2052  . ibuprofen (ADVIL,MOTRIN) tablet 600 mg  600 mg Oral Q6H PRN Fransisca Kaufmann, NP      . lisinopril (PRINIVIL,ZESTRIL) tablet 40 mg  40 mg Oral Daily Kerry Hough, PA-C   40 mg at 03/30/13 0810  . magnesium hydroxide (MILK OF MAGNESIA) suspension 30 mL  30 mL Oral Daily PRN Kerry Hough, PA-C      . multivitamin with minerals tablet 1 tablet  1 tablet Oral Daily Kerry Hough, PA-C   1 tablet at 03/30/13 0981  . pantoprazole (PROTONIX) EC tablet 40 mg  40 mg Oral Daily Kerry Hough, PA-C   40 mg at 03/30/13 1914  . sertraline (ZOLOFT) tablet 100 mg  100 mg Oral Daily Kerry Hough, PA-C   100 mg at 03/30/13 7829  . simvastatin (ZOCOR) tablet 5 mg  5 mg Oral q1800 Kerry Hough, PA-C   5 mg at 03/29/13 1708  . sodium chloride (OCEAN) 0.65 % nasal spray 1 spray  1 spray Each Nare PRN Nanine Means, NP      . spironolactone (ALDACTONE) tablet 25 mg  25 mg Oral BID Kerry Hough, PA-C   25 mg at 03/30/13 0811  . thiamine (B-1) injection 100 mg  100 mg Intramuscular Once Spencer E Simon, PA-C      . thiamine (VITAMIN B-1) tablet 100 mg  100 mg Oral Daily Kerry Hough, PA-C   100 mg at 03/30/13 5621    Lab Results:  Results for orders placed during the hospital encounter of 03/26/13 (from the past 48 hour(s))  GLUCOSE, CAPILLARY     Status: Abnormal   Collection Time    03/29/13 11:50 AM      Result Value Range   Glucose-Capillary 110 (*) 70 - 99 mg/dL   Comment 1 Notify RN      Physical Findings: AIMS: Facial and Oral Movements Muscles of Facial Expression: None, normal Lips and Perioral Area: None, normal Jaw: None, normal Tongue: None, normal,Extremity Movements Upper (arms, wrists, hands, fingers): None, normal Lower (legs, knees, ankles, toes): None, normal, Trunk Movements Neck, shoulders, hips: None, normal, Overall Severity Severity of abnormal movements (highest score from questions above): None, normal Incapacitation due to abnormal movements: None, normal Patient's awareness of abnormal movements (rate only patient's report): No Awareness, Dental Status Current problems with teeth and/or dentures?: No Does patient usually wear dentures?: No  CIWA:  CIWA-Ar Total: 2 COWS:     Treatment Plan Summary: Daily contact with patient to assess and evaluate symptoms and progress in treatment Medication management  Plan:  Review of chart, vital signs, medications, and notes. 1-Individual and group therapy 2-Medication management for alcohol abuse and anxiety:  Medications reviewed with the patient and Rozerem started for sleep 3-Coping skills  for anxiety and alcohol abuse 4-Continue crisis stabilization and management 5-Address health issues--monitoring vital signs, stable 6-Treatment plan in progress to prevent relapse of alcohol abuse and anxiety  Medical Decision Making Problem Points:  Established problem, stable/improving (1) and Review of psycho-social stressors (1) Data Points:  Review of new medications or change in dosage (2)  I certify that inpatient services furnished can reasonably be expected to improve the patient's condition.   Nanine Means, PMH-NP 03/30/2013, 5:14 PM  Reviewed the information documented and agree with the treatment plan.  Desmen Schoffstall,JANARDHAHA R. 03/31/2013 3:32 PM

## 2013-03-30 NOTE — Progress Notes (Signed)
D:  Patient up and visible in the milieu.  Attending groups and interacting well with peers.  He denies depression, hopelessness, or anxiety.  He also denies thoughts of self harm and states appetite and sleep are fair.  He had an episode of mild epistaxis prior to lunch today.   A:  Blood pressure was checked and noted to be within defined limits.  He was encouraged to just dab at his nose and to apply a cold compress if needed.  All medications given as prescribed.  Offered support and encouragement. R:  Cooperative with staff.  Interacting well with peers.  Tolerating medications well.  Safety is maintained.

## 2013-03-30 NOTE — [facility] Group Notes (Signed)
[facility] Group Notes:  (Nursing/MHT/Case Management/Adjunct)  Date:  03/30/2013  Time:  12:51 PM  Type of Therapy:  Psychoeducational Skills  Participation Level:  Active  Participation Quality:  Appropriate  Affect:  Appropriate  Cognitive:  Appropriate  Insight:  Appropriate  Engagement in Group:  Engaged  Modes of Intervention:  Problem-solving  Summary of Progress/Problems: Pt attended self inventory group, and also worked on Optician, dispensing.   Jacquelyne Balint Shanta 03/30/2013, 12:51 PM

## 2013-03-30 NOTE — B[facility] Group Notes (Signed)
[facility] Group Notes:  (Clinical Social Work)  03/30/2013     1-2pm  Summary of Progress/Problems:   The main focus of today's process group was for the patient to identify ways in which they have in the past sabotaged their own recovery. Motivational Interviewing was utilized to ask the group members what they get out of their substance use, and what reasons they may have for wanting to change.  The Stages of Change were explained using a handout, and patients identified where they currently are with regard to stages of change.  The patient expressed that he has been drinking for so long, he is accustomed to always reaching for a drink, even if he gets up at 3am.  He said it takes him to his happy place, but that it then can easily cross over into his blackout phase.  When he does not drink for a day or two, he has panic attacks and develops nausea and shakiness.  He feels he is in Preparation Stage of Change.  Type of Therapy:  Group Therapy - Process   Participation Level:  Active  Participation Quality:  Attentive and Sharing  Affect:  Blunted  Cognitive:  Appropriate and Oriented  Insight:  Engaged  Engagement in Therapy:  Engaged  Modes of Intervention:  Education, Teacher, English as a foreign language, Motivational Interviewing  Ambrose Mantle, LCSW 03/30/2013, 12:22 PM

## 2013-03-31 MED ORDER — PRAVASTATIN SODIUM 40 MG PO TABS: 80.0000 mg | ORAL_TABLET | Freq: Every day | ORAL | Status: DC

## 2013-03-31 MED ORDER — PRAVASTATIN SODIUM 40 MG PO TABS
80.0000 mg | ORAL_TABLET | Freq: Every day | ORAL | Status: DC
  Filled 2013-03-31: qty 2

## 2013-03-31 MED ORDER — FUROSEMIDE 20 MG PO TABS: 20.0000 mg | ORAL_TABLET | Freq: Two times a day (BID) | ORAL | Status: DC

## 2013-03-31 MED ORDER — OMEPRAZOLE 20 MG PO CPDR
20.0000 mg | DELAYED_RELEASE_CAPSULE | Freq: Every day | ORAL | Status: DC
  Filled 2013-03-31: qty 1

## 2013-03-31 MED ORDER — SERTRALINE HCL 100 MG PO TABS: ORAL_TABLET | ORAL | Status: DC

## 2013-03-31 MED ORDER — CARVEDILOL 25 MG PO TABS: 25.0000 mg | ORAL_TABLET | Freq: Two times a day (BID) | ORAL | Status: DC

## 2013-03-31 MED ORDER — OMEPRAZOLE 20 MG PO CPDR: 20.0000 mg | DELAYED_RELEASE_CAPSULE | Freq: Every day | ORAL | Status: DC

## 2013-03-31 MED ORDER — SPIRONOLACTONE 25 MG PO TABS: 25.0000 mg | ORAL_TABLET | Freq: Two times a day (BID) | ORAL | Status: DC

## 2013-03-31 MED ORDER — LISINOPRIL 40 MG PO TABS: 40.0000 mg | ORAL_TABLET | Freq: Every day | ORAL | Status: DC

## 2013-03-31 NOTE — [facility] Suicide Risk Assessment (Signed)
Suicide Risk Assessment  Discharge Assessment     Demographic Factors:  Male, Adolescent or young adult, Caucasian and Unemployed  Mental Status Per Nursing Assessment::   On Admission:  NA (Pt SI at ED, but denies now)  Current Mental Status by Physician: NA  Loss Factors: NA  Historical Factors: Impulsivity  Risk Reduction Factors:   Sense of responsibility to family, Religious beliefs about death, Living with another person, especially a relative, Positive social support, Positive therapeutic relationship and Positive coping skills or problem solving skills  Continued Clinical Symptoms:  Panic Attacks Alcohol/Substance Abuse/Dependencies Previous Psychiatric Diagnoses and Treatments Medical Diagnoses and Treatments/Surgeries  Cognitive Features That Contribute To Risk:  Polarized thinking    Suicide Risk:  Minimal: No identifiable suicidal ideation.  Patients presenting with no risk factors but with morbid ruminations; may be classified as minimal risk based on the severity of the depressive symptoms  Discharge Diagnoses:   AXIS I:  Post Traumatic Stress Disorder and Alcohol dependence AXIS II:  Deferred AXIS III:   Past Medical History  Diagnosis Date  . Diabetes mellitus without complication   . Hypertension   . Hypercholesteremia   . BPH (benign prostatic hyperplasia)   . Cancer     prostate   AXIS IV:  other psychosocial or environmental problems, problems related to social environment and problems with primary support group AXIS V:  61-70 mild symptoms  Plan Of Care/Follow-up recommendations:  Activity:  As tolerated Diet:  Regular  Is patient on multiple antipsychotic therapies at discharge:  No   Has Patient had three or more failed trials of antipsychotic monotherapy by history:  No  Recommended Plan for Multiple Antipsychotic Therapies: NA  Nehemiah Settle., M.D. 03/31/2013, 3:09 PM

## 2013-03-31 NOTE — [facility] Group Notes (Signed)
[facility] Group Notes:  (Clinical Social Work)  03/31/2013  10:00-11:00AM  Summary of Progress/Problems:   The main focus of today's process group was to   identify the patient's current support system and decide on other supports that can be put in place.  The picture on workbook was used to discuss why additional supports are needed, and a hand-out was distributed with four definitions/levels of support, then used to talk about how patients have given and received all different kinds of support.  An emphasis was placed on using counselor, doctor, therapy groups, 12-step groups, and problem-specific support groups to expand supports.  The patient identified one additional support as being past schoolmates from elementary, middle and high school who have been in touch with him and want to see him regain sobriety.  Type of Therapy:  Process Group with Motivational Interviewing  Participation Level:  Active  Participation Quality:  Attentive and Sharing  Affect:  Appropriate  Cognitive:  Appropriate and Oriented  Insight:  Engaged  Engagement in Therapy:  Engaged  Modes of Intervention:   Education, Support and Processing, Activity  [employer], LCSW 03/31/2013, 12:15pm

## 2013-03-31 NOTE — [facility] Group Notes (Signed)
[facility] Group Notes:  (Nursing/MHT/Case Management/Adjunct)  Date:  03/31/2013  Time:  2:24 PM  Type of Therapy:  Nurse Education  Participation Level:  Active  Participation Quality:  Appropriate and Attentive  Affect:  Appropriate  Cognitive:  Alert and Appropriate  Insight:  Good  Engagement in Group:  Engaged  Modes of Intervention:  Education  Summary of Progress/Problems:  Francis Cardenas 03/31/2013, 2:24 PM

## 2013-03-31 NOTE — Progress Notes (Signed)
Pt is observed resting in bed with eyes closed. He has slept all night since start of this writer's shift (1930). No distress noted, no complaints voiced. Level III obs in place for safety and pt is safe. Lawrence Marseilles

## 2013-03-31 NOTE — Progress Notes (Signed)
Patient ID: Francis Cardenas, male   DOB: December 29, 1950, 62 y.o.   MRN: [id number] [facility] MD Progress Note  03/31/2013 12:49 PM BRAISON SNOKE  MRN:  [id number] Subjective:   Patient is seen in bed at 4pm with CPAP in place trying to sleep. States he could sleep all day if he could. He denies new problems and is ready to attend [facility] in the AM. HE denies any withdrawal symptoms, SI/HI or AVH. He is anxious to go and is informed that his prescriptions are written and the samples are ordered. 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 Diabetes mellitus without complication   . Hypertension   . Hypercholesteremia   . BPH (benign prostatic hyperplasia)   . Cancer     prostate   Axis IV: other psychosocial or environmental problems, problems related to social environment and problems with primary support group Axis V: 41-50 serious symptoms  ADL's:  Intact  Sleep: Poor  Appetite:  Good  Suicidal Ideation:  Denies  Homicidal Ideation:  Denies   Psychiatric Specialty Exam: Review of Systems  Constitutional: Negative.   HENT: Negative.   Eyes: Negative.   Respiratory: Negative.   Cardiovascular: Negative.   Gastrointestinal: Negative.   Genitourinary: Negative.   Musculoskeletal: Negative.   Skin: Negative.   Neurological: Negative.   Endo/Heme/Allergies: Negative.   Psychiatric/Behavioral: Positive for substance abuse. The patient is nervous/anxious and has insomnia.     Blood pressure 116/65, pulse 80, temperature 98.4 F (36.9 C), temperature source Oral, resp. rate 18, height 5\' 9"  (1.753 m), weight 134.265 kg (296 lb).Body mass index is 43.69 kg/(m^2).  General Appearance: Casual  Eye Contact::  Fair  Speech:  Normal Rate  Volume:   Normal  Mood:  Anxious  Affect:  Congruent  Thought Process:  Coherent  Orientation:  Full (Time, Place, and Person)  Thought Content:  WDL  Suicidal Thoughts:  No  Homicidal Thoughts:  No  Memory:  Immediate;   Fair Recent;   Fair Remote;   Fair  Judgement:  Fair  Insight:  Fair  Psychomotor Activity:  Decreased  Concentration:  Fair  Recall:  Fair  Akathisia:  No  Handed:  Right  AIMS (if indicated):     Assets:  Leisure Time Resilience Social Support  Sleep:  Number of Hours: 5.5   Current Medications: Current Facility-Administered Medications  Medication Dose Route Frequency Provider Last Rate Last Dose  . acetaminophen (TYLENOL) tablet 650 mg  650 mg Oral Q6H PRN Kerry Hough, PA-C   650 mg at 03/29/13 1324  . albuterol (PROVENTIL HFA;VENTOLIN HFA) 108 (90 BASE) MCG/ACT inhaler 2 puff  2 puff Inhalation Q6H PRN Kerry Hough, PA-C      . alum & mag hydroxide-simeth (MAALOX/MYLANTA) 200-200-20 MG/5ML suspension 30 mL  30 mL Oral Q4H PRN Kerry Hough, PA-C      . aspirin EC tablet 81 mg  81 mg Oral Daily Kerry Hough, PA-C   81 mg at 03/31/13 0742  . carvedilol (COREG) tablet 25 mg  25 mg Oral BID WC Kerry Hough, PA-C   25 mg at 03/31/13 0742  . furosemide (LASIX) tablet 20 mg  20 mg Oral BID Kerry Hough, PA-C   20 mg at 03/31/13 1324  .  hydrOXYzine (ATARAX/VISTARIL) tablet 25 mg  25 mg Oral QHS,MR X 1 Larena Sox, MD   25 mg at 03/29/13 2052  . ibuprofen (ADVIL,MOTRIN) tablet 600 mg  600 mg Oral Q6H PRN Fransisca Kaufmann, NP      . lisinopril (PRINIVIL,ZESTRIL) tablet 40 mg  40 mg Oral Daily Kerry Hough, PA-C   40 mg at 03/31/13 0742  . magnesium hydroxide (MILK OF MAGNESIA) suspension 30 mL  30 mL Oral Daily PRN Kerry Hough, PA-C      . multivitamin with minerals tablet 1 tablet  1 tablet Oral Daily Kerry Hough, PA-C   1 tablet at 03/31/13 0742  . pantoprazole (PROTONIX) EC tablet 40 mg  40 mg Oral Daily Kerry Hough, PA-C   40 mg at  03/31/13 0742  . sertraline (ZOLOFT) tablet 100 mg  100 mg Oral Daily Kerry Hough, PA-C   100 mg at 03/31/13 7829  . simvastatin (ZOCOR) tablet 5 mg  5 mg Oral q1800 Kerry Hough, PA-C   5 mg at 03/30/13 1719  . sodium chloride (OCEAN) 0.65 % nasal spray 1 spray  1 spray Each Nare PRN Nanine Means, NP      . spironolactone (ALDACTONE) tablet 25 mg  25 mg Oral BID Kerry Hough, PA-C   25 mg at 03/31/13 0742  . thiamine (B-1) injection 100 mg  100 mg Intramuscular Once [employer], PA-C      . thiamine (VITAMIN B-1) tablet 100 mg  100 mg Oral Daily Kerry Hough, PA-C   100 mg at 03/31/13 5621    Lab Results:  No results found for this or any previous visit (from the past 48 hour(s)).  Physical Findings: AIMS: Facial and Oral Movements Muscles of Facial Expression: None, normal Lips and Perioral Area: None, normal Jaw: None, normal Tongue: None, normal,Extremity Movements Upper (arms, wrists, hands, fingers): None, normal Lower (legs, knees, ankles, toes): None, normal, Trunk Movements Neck, shoulders, hips: None, normal, Overall Severity Severity of abnormal movements (highest score from questions above): None, normal Incapacitation due to abnormal movements: None, normal Patient's awareness of abnormal movements (rate only patient's report): No Awareness, Dental Status Current problems with teeth and/or dentures?: No Does patient usually wear dentures?: No  CIWA:  CIWA-Ar Total: 2 COWS:     Treatment Plan Summary: Daily contact with patient to assess and evaluate symptoms and progress in treatment Medication management  Plan:  Review of chart, vital signs, medications, and notes. 1-Individual and group therapy 2-Medication management for alcohol abuse and anxiety:  Medications reviewed with the patient and Rozerem started for sleep 3-Coping skills for anxiety and alcohol abuse 4-Continue crisis stabilization and management 5-Address health issues--monitoring  vital signs, stable 6-Treatment plan in progress to prevent relapse of alcohol abuse and anxiety 7. D/C in AM. SRA is written, meds are reconciled and prescriptions are written.  Medical Decision Making Problem Points:  Established problem, stable/improving (1) and Review of psycho-social stressors (1) Data Points:  Review of new medications or change in dosage (2)  I certify that inpatient services furnished can reasonably be expected to improve the patient's condition.  Rona Ravens. [place] RPAC 4:31 PM 03/31/2013  Reviewed the information documented and agree with the treatment plan.  Arien Morine,JANARDHAHA R. 04/01/2013 12:49 PM

## 2013-03-31 NOTE — Progress Notes (Signed)
D   Pt is cooperative and pleasant   He reports sleeping well and his appetite is improving   He reports a low energy level his ability to pay attention is poor  He did not rate his depression and hopelessness he just put N/A   Pt does appear depressed   He reports some irritability and tremors as lingering withdrawal symptoms  He reports still feeling lightheaded dizzy and having headaches  He reports pain of 8 on his self inventory but denied same at medication window   Pt attends and participates in groups and is scheduled to be discharged tomorrow A   Verbal support given   Medications administered and effectiveness monitored   Q 15 min checks R  Pt is safe at present

## 2013-03-31 NOTE — Progress Notes (Signed)
Psychoeducational Group Note  Date:  03/30/2013 Time:  8:00 p.m.   Group Topic/Focus:  Wrap-Up Group:   The focus of this group is to help patients review their daily goal of treatment and discuss progress on daily workbooks.  Participation Level: Did Not Attend  Participation Quality:  Not Applicable  Affect:  Not Applicable  Cognitive:  Not Applicable  Insight:  Not Applicable  Engagement in Group: Not Applicable  Additional Comments:  The patient didn't attend group this evening as he was asleep in his bedroom.   Malaiyah Achorn S 03/31/2013, 1:26 AM

## 2013-04-01 DIAGNOSIS — F332 Major depressive disorder, recurrent severe without psychotic features: Secondary | ICD-10-CM

## 2013-04-01 NOTE — Progress Notes (Signed)
Pt discharged to go to [facility] this morning.  Discharge instructions reviewed with patient.  Pt voiced understanding.  Pt's belongings from locker #2 returned and paperwork signed.  Pt in a positive mood this morning and looking forward to the next step of his treatment.  Pt was transported to the [facility] by security to get his car.  He said he was going home to get his wife who will then take him to his appointment and then take his car home.

## 2013-04-01 NOTE — Progress Notes (Signed)
Pt resting in bed with eyes closed.  No distress observed.  Respirations even/unlabored.  CPAP in place.  Safety maintained with q15 minute checks.

## 2013-04-01 NOTE — Progress Notes (Signed)
Patient did attend the evening speaker AA meeting.  

## 2013-04-04 NOTE — Progress Notes (Signed)
Patient Discharge Instructions:  After Visit Summary (AVS):   Faxed to:  04/04/13 Psychiatric Admission Assessment Note:   Faxed to:  04/04/13 Suicide Risk Assessment - Discharge Assessment:   Faxed to:  04/04/13 Faxed/Sent to the Next Level Care provider:  04/04/13 Faxed to [facility] @ [phone] Faxed to [facility] @ [phone]  Jerelene Redden, 04/04/2013, 3:17 PM

## 2013-04-08 NOTE — Discharge Summary (Signed)
Physician Discharge Summary Note  Patient:  Francis Cardenas is an 62 y.o., male MRN:  [id number] DOB:  [birth year] Patient phone:  [phone] (home)  Patient address:   [address] [place] [address],   Date of Admission:  03/26/2013 Date of Discharge: 04/01/2013  Reason for Admission:  Alcohol dependency/detox  Discharge Diagnoses: Principal Problem:   Alcohol dependence Active Problems:   PTSD (post-traumatic stress disorder)  Review of Systems  Constitutional: Negative.   HENT: Negative.   Eyes: Negative.   Respiratory: Negative.   Cardiovascular: Negative.   Gastrointestinal: Negative.   Genitourinary: Negative.   Musculoskeletal: Negative.   Skin: Negative.   Neurological: Negative.   Endo/Heme/Allergies: Negative.   Psychiatric/Behavioral: Positive for substance abuse. The patient is nervous/anxious.     DSM5:  Substance/Addictive Disorders:  Alcohol Related Disorder - Severe (303.90), Alcohol Intoxication without Use Disorder (F10.929) and Alcohol Withdrawal (291.81) Depressive Disorders:  Major Depressive Disorder - Mild (296.21)  Axis Diagnosis:   AXIS I:  Alcohol Abuse, Anxiety Disorder NOS and Major Depression, Recurrent severe AXIS II:  Deferred AXIS III:   Past Medical History  Diagnosis Date  . Diabetes mellitus without complication   . Hypertension   . Hypercholesteremia   . BPH (benign prostatic hyperplasia)   . Cancer     prostate   AXIS IV:  other psychosocial or environmental problems, problems related to social environment and problems with primary support group AXIS V:  61-70 mild symptoms  Level of Care:  [facility]  Hospital Course:  On admission:  62 Y/O male who has been drinking "all his life". states he was 20 % [place] with the [place]. States he was reviewed twice and increased to 100 . States that he is retired. States that since he has been retired he is drinking starting in the morning. When working, he started drinking after work.  Drinks 18 beers every day sometimes 22, 23 States he has been drinking like that for the Last 2 years. States they found he has prostate cancer was given radiation. Endorses persistent conflict with his wife, communication issues( decreased hearing)  During hospitalization:  Librium alcohol protocol implemented successfully.  Medications continued from home for his cardiac issues along with his Zoloft 100 mg for depression.  Francis Cardenas attended and participated in therapy.  He denied suicidal/homicidal ideations and auditory/visual hallucinations, follow-up appointments encouraged to attend, outside support groups encouraged and information given, Rx given with a 2 week supply of medications to take to [facility].  Francis Cardenas is mentally and physically stable for discharge.  Consults:  None  Significant Diagnostic Studies:  labs: completed, reviewed, stable  Discharge Vitals:   Blood pressure 128/71, pulse 74, temperature 98.4 F (36.9 C), temperature source Oral, resp. rate 18, height 5\' 9"  (1.753 m), weight 134.265 kg (296 lb). Body mass index is 43.69 kg/(m^2). Lab Results:   No results found for this or any previous visit (from the past 72 hour(s)).  Physical Findings: AIMS: Facial and Oral Movements Muscles of Facial Expression: None, normal Lips and Perioral Area: None, normal Jaw: None, normal Tongue: None, normal,Extremity Movements Upper (arms, wrists, hands, fingers): None, normal Lower (legs, knees, ankles, toes): None, normal, Trunk Movements Neck, shoulders, hips: None, normal, Overall Severity Severity of abnormal movements (highest score from questions above): None, normal Incapacitation due to abnormal movements: None, normal Patient's awareness of abnormal movements (rate only patient's report): No Awareness, Dental Status Current problems with teeth and/or dentures?: No Does patient usually wear dentures?: No  CIWA:  CIWA-Ar Total: 2 COWS:     Psychiatric Specialty Exam: See  Psychiatric Specialty Exam and Suicide Risk Assessment completed by Attending Physician prior to discharge.  Discharge destination:  [facility]  Is patient on multiple antipsychotic therapies at discharge:  No   Has Patient had three or more failed trials of antipsychotic monotherapy by history:  No  Recommended Plan for Multiple Antipsychotic Therapies: NA  Discharge Orders   Future Orders Complete By Expires   Diet - low sodium heart healthy  As directed    Discharge instructions  As directed    Comments:     Take all of your medications as directed. Be sure to keep all of your follow up appointments.  If you are unable to keep your follow up appointment, call your Doctor's office to let them know, and reschedule.  Make sure that you have enough medication to last until your appointment. Be sure to get plenty of rest. Going to bed at the same time each night will help. Try to avoid sleeping during the day.  Increase your activity as tolerated. Regular exercise will help you to sleep better and improve your mental health. Eating a heart healthy diet is recommended. Try to avoid salty or fried foods. Be sure to avoid all alcohol and illegal drugs.   Increase activity slowly  As directed        Medication List    STOP taking these medications       amLODipine 10 MG tablet  Commonly known as:  NORVASC     multivitamin with minerals Tabs tablet      TAKE these medications     Indication   albuterol 108 (90 BASE) MCG/ACT inhaler  Commonly known as:  PROVENTIL HFA;VENTOLIN HFA  Inhale 2 puffs into the lungs every 6 (six) hours as needed for wheezing.      aspirin EC 81 MG tablet  Take 81 mg by mouth daily.      carvedilol 25 MG tablet  Commonly known as:  COREG  Take 1 tablet (25 mg total) by mouth 2 (two) times daily with a meal. For hypertension.   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 For edema.   Indication:  Edema, High Blood Pressure     lisinopril 40 MG tablet  Commonly known as:  PRINIVIL,ZESTRIL  Take 1 tablet (40 mg total) by mouth daily. For hypertension.   Indication:  High Blood Pressure     omeprazole 20 MG capsule  Commonly known as:  PRILOSEC  Take 1 capsule (20 mg total) by mouth daily. For reflux   Indication:  Gastroesophageal Reflux Disease with Current Symptoms     pravastatin 40 MG tablet  Commonly known as:  PRAVACHOL  Take 2 tablets (80 mg total) by mouth at bedtime. For dyslipidemia.   Indication:  Disease involving Cholesterol Deposits in the Arteries     sertraline 100 MG tablet  Commonly known as:  ZOLOFT  Take one pill by mouth each day for depression and anxiety.   Indication:  Major Depressive Disorder     spironolactone 25 MG tablet  Commonly known as:  ALDACTONE  Take 1 tablet (25 mg total) by mouth 2 (two) times daily. For hypertension and edema.   Indication:  Edema  Follow-up Information   Follow up with [facility] On 04/01/2013. (Arrive at 8:00 am promptly for admission screening. Bring all medication, ID and belongings. )    Contact information:   [address] [place], [place] [address] Phone: [phone] Fax: [phone]      [facility] (Call to schedule follow-up/medication management after discharge from [facility]. )    Contact information:   [address] [place], [place] [address] Phone: [phone] Fax: [phone]      Follow-up recommendations:  Activity:  as tolerated Diet:  low-sodium heart healthy diet Continue to work your relapse prevention plan Comments:  Patient will continue his care at [facility] and the [place].  Total Discharge Time:  Greater than 30 minutes.  SignedNanine Means, PMH-NP 04/08/2013, 2:54 PM Agree with assessment and plan Madie Reno A. Dub Mikes, M.D.

## 2013-06-11 DIAGNOSIS — G8921 Chronic pain due to trauma: Secondary | ICD-10-CM | POA: Diagnosis present

## 2013-06-11 DIAGNOSIS — I10 Essential (primary) hypertension: Secondary | ICD-10-CM | POA: Diagnosis present

## 2016-06-30 ENCOUNTER — Encounter (HOSPITAL_COMMUNITY): Payer: Self-pay

## 2016-07-18 ENCOUNTER — Ambulatory Visit (HOSPITAL_COMMUNITY): Payer: Self-pay | Admitting: Psychiatry

## 2016-08-02 DIAGNOSIS — E1169 Type 2 diabetes mellitus with other specified complication: Secondary | ICD-10-CM | POA: Diagnosis present

## 2016-08-02 DIAGNOSIS — C61 Malignant neoplasm of prostate: Secondary | ICD-10-CM | POA: Diagnosis present

## 2016-08-02 DIAGNOSIS — G629 Polyneuropathy, unspecified: Secondary | ICD-10-CM

## 2016-08-02 DIAGNOSIS — J4489 Other specified chronic obstructive pulmonary disease: Secondary | ICD-10-CM | POA: Diagnosis present

## 2016-08-02 DIAGNOSIS — E119 Type 2 diabetes mellitus without complications: Secondary | ICD-10-CM | POA: Diagnosis present

## 2016-08-02 DIAGNOSIS — E785 Hyperlipidemia, unspecified: Secondary | ICD-10-CM | POA: Diagnosis present

## 2016-08-02 DIAGNOSIS — E669 Obesity, unspecified: Secondary | ICD-10-CM | POA: Diagnosis present

## 2016-08-02 DIAGNOSIS — E782 Mixed hyperlipidemia: Secondary | ICD-10-CM | POA: Diagnosis present

## 2016-08-02 DIAGNOSIS — K760 Fatty (change of) liver, not elsewhere classified: Secondary | ICD-10-CM | POA: Diagnosis present

## 2016-08-02 DIAGNOSIS — J449 Chronic obstructive pulmonary disease, unspecified: Secondary | ICD-10-CM | POA: Diagnosis present

## 2016-08-02 DIAGNOSIS — K219 Gastro-esophageal reflux disease without esophagitis: Secondary | ICD-10-CM | POA: Diagnosis present

## 2017-12-30 ENCOUNTER — Observation Stay (HOSPITAL_COMMUNITY): Payer: No Typology Code available for payment source

## 2017-12-30 ENCOUNTER — Emergency Department (HOSPITAL_COMMUNITY): Payer: No Typology Code available for payment source

## 2017-12-30 ENCOUNTER — Encounter (HOSPITAL_COMMUNITY): Payer: Self-pay | Admitting: Emergency Medicine

## 2017-12-30 ENCOUNTER — Other Ambulatory Visit: Payer: Self-pay

## 2017-12-30 ENCOUNTER — Observation Stay (HOSPITAL_BASED_OUTPATIENT_CLINIC_OR_DEPARTMENT_OTHER): Payer: No Typology Code available for payment source

## 2017-12-30 ENCOUNTER — Inpatient Hospital Stay (HOSPITAL_COMMUNITY)
Admission: EM | Admit: 2017-12-30 | Discharge: 2018-01-05 | DRG: 291 | Disposition: A | Discharge status: Home / Self Care | Payer: No Typology Code available for payment source | Attending: Family Medicine | Admitting: Family Medicine

## 2017-12-30 DIAGNOSIS — E785 Hyperlipidemia, unspecified: Secondary | ICD-10-CM | POA: Diagnosis present

## 2017-12-30 DIAGNOSIS — M25522 Pain in left elbow: Secondary | ICD-10-CM

## 2017-12-30 DIAGNOSIS — I509 Heart failure, unspecified: Secondary | ICD-10-CM

## 2017-12-30 DIAGNOSIS — J9600 Acute respiratory failure, unspecified whether with hypoxia or hypercapnia: Secondary | ICD-10-CM

## 2017-12-30 DIAGNOSIS — I340 Nonrheumatic mitral (valve) insufficiency: Secondary | ICD-10-CM

## 2017-12-30 DIAGNOSIS — I110 Hypertensive heart disease with heart failure: Principal | ICD-10-CM | POA: Diagnosis present

## 2017-12-30 DIAGNOSIS — I248 Other forms of acute ischemic heart disease: Secondary | ICD-10-CM | POA: Diagnosis present

## 2017-12-30 DIAGNOSIS — E669 Obesity, unspecified: Secondary | ICD-10-CM | POA: Diagnosis present

## 2017-12-30 DIAGNOSIS — E1140 Type 2 diabetes mellitus with diabetic neuropathy, unspecified: Secondary | ICD-10-CM | POA: Diagnosis present

## 2017-12-30 DIAGNOSIS — I5023 Acute on chronic systolic (congestive) heart failure: Secondary | ICD-10-CM | POA: Diagnosis present

## 2017-12-30 DIAGNOSIS — Z8546 Personal history of malignant neoplasm of prostate: Secondary | ICD-10-CM

## 2017-12-30 DIAGNOSIS — Z87891 Personal history of nicotine dependence: Secondary | ICD-10-CM

## 2017-12-30 DIAGNOSIS — E876 Hypokalemia: Secondary | ICD-10-CM | POA: Diagnosis not present

## 2017-12-30 DIAGNOSIS — N400 Enlarged prostate without lower urinary tract symptoms: Secondary | ICD-10-CM | POA: Diagnosis present

## 2017-12-30 DIAGNOSIS — R079 Chest pain, unspecified: Secondary | ICD-10-CM

## 2017-12-30 DIAGNOSIS — F32A Depression, unspecified: Secondary | ICD-10-CM | POA: Diagnosis present

## 2017-12-30 DIAGNOSIS — Z79899 Other long term (current) drug therapy: Secondary | ICD-10-CM

## 2017-12-30 DIAGNOSIS — J440 Chronic obstructive pulmonary disease with acute lower respiratory infection: Secondary | ICD-10-CM | POA: Diagnosis present

## 2017-12-30 DIAGNOSIS — J449 Chronic obstructive pulmonary disease, unspecified: Secondary | ICD-10-CM | POA: Diagnosis present

## 2017-12-30 DIAGNOSIS — K760 Fatty (change of) liver, not elsewhere classified: Secondary | ICD-10-CM | POA: Diagnosis present

## 2017-12-30 DIAGNOSIS — G8929 Other chronic pain: Secondary | ICD-10-CM

## 2017-12-30 DIAGNOSIS — E782 Mixed hyperlipidemia: Secondary | ICD-10-CM | POA: Diagnosis present

## 2017-12-30 DIAGNOSIS — J9601 Acute respiratory failure with hypoxia: Secondary | ICD-10-CM | POA: Diagnosis not present

## 2017-12-30 DIAGNOSIS — Y92230 Patient room in hospital as the place of occurrence of the external cause: Secondary | ICD-10-CM | POA: Diagnosis not present

## 2017-12-30 DIAGNOSIS — F4310 Post-traumatic stress disorder, unspecified: Secondary | ICD-10-CM | POA: Diagnosis present

## 2017-12-30 DIAGNOSIS — G629 Polyneuropathy, unspecified: Secondary | ICD-10-CM

## 2017-12-30 DIAGNOSIS — J189 Pneumonia, unspecified organism: Secondary | ICD-10-CM

## 2017-12-30 DIAGNOSIS — M25529 Pain in unspecified elbow: Secondary | ICD-10-CM

## 2017-12-30 DIAGNOSIS — E119 Type 2 diabetes mellitus without complications: Secondary | ICD-10-CM | POA: Diagnosis present

## 2017-12-30 DIAGNOSIS — C61 Malignant neoplasm of prostate: Secondary | ICD-10-CM | POA: Diagnosis present

## 2017-12-30 DIAGNOSIS — R7401 Elevation of levels of liver transaminase levels: Secondary | ICD-10-CM

## 2017-12-30 DIAGNOSIS — M109 Gout, unspecified: Secondary | ICD-10-CM | POA: Diagnosis present

## 2017-12-30 DIAGNOSIS — L03114 Cellulitis of left upper limb: Secondary | ICD-10-CM | POA: Diagnosis present

## 2017-12-30 DIAGNOSIS — F1010 Alcohol abuse, uncomplicated: Secondary | ICD-10-CM | POA: Diagnosis present

## 2017-12-30 DIAGNOSIS — T501X5A Adverse effect of loop [high-ceiling] diuretics, initial encounter: Secondary | ICD-10-CM | POA: Diagnosis not present

## 2017-12-30 DIAGNOSIS — R740 Nonspecific elevation of levels of transaminase and lactic acid dehydrogenase [LDH]: Secondary | ICD-10-CM | POA: Diagnosis not present

## 2017-12-30 DIAGNOSIS — F329 Major depressive disorder, single episode, unspecified: Secondary | ICD-10-CM | POA: Diagnosis present

## 2017-12-30 DIAGNOSIS — M7022 Olecranon bursitis, left elbow: Secondary | ICD-10-CM | POA: Diagnosis present

## 2017-12-30 DIAGNOSIS — J181 Lobar pneumonia, unspecified organism: Secondary | ICD-10-CM | POA: Diagnosis present

## 2017-12-30 DIAGNOSIS — F1020 Alcohol dependence, uncomplicated: Secondary | ICD-10-CM | POA: Diagnosis present

## 2017-12-30 DIAGNOSIS — Z7982 Long term (current) use of aspirin: Secondary | ICD-10-CM

## 2017-12-30 DIAGNOSIS — I10 Essential (primary) hypertension: Secondary | ICD-10-CM | POA: Diagnosis present

## 2017-12-30 DIAGNOSIS — E1169 Type 2 diabetes mellitus with other specified complication: Secondary | ICD-10-CM | POA: Diagnosis present

## 2017-12-30 DIAGNOSIS — K219 Gastro-esophageal reflux disease without esophagitis: Secondary | ICD-10-CM | POA: Diagnosis present

## 2017-12-30 DIAGNOSIS — J4489 Other specified chronic obstructive pulmonary disease: Secondary | ICD-10-CM | POA: Diagnosis present

## 2017-12-30 LAB — CBC WITH DIFFERENTIAL/PLATELET
ABS IMMATURE GRANULOCYTES: 0.1 10*3/uL (ref 0.0–0.1)
BASOS ABS: 0.1 10*3/uL (ref 0.0–0.1)
Basophils Relative: 1 %
EOS PCT: 1 %
Eosinophils Absolute: 0.1 10*3/uL (ref 0.0–0.7)
HEMATOCRIT: 43.4 % (ref 39.0–52.0)
HEMOGLOBIN: 14.4 g/dL (ref 13.0–17.0)
Immature Granulocytes: 1 %
LYMPHS ABS: 0.6 10*3/uL — AB (ref 0.7–4.0)
Lymphocytes Relative: 5 %
MCH: 30.8 pg (ref 26.0–34.0)
MCHC: 33.2 g/dL (ref 30.0–36.0)
MCV: 92.7 fL (ref 78.0–100.0)
MONO ABS: 0.4 10*3/uL (ref 0.1–1.0)
Monocytes Relative: 3 %
NEUTROS ABS: 11.1 10*3/uL — AB (ref 1.7–7.7)
Neutrophils Relative %: 89 %
Platelets: 166 10*3/uL (ref 150–400)
RBC: 4.68 MIL/uL (ref 4.22–5.81)
RDW: 14 % (ref 11.5–15.5)
WBC: 12.3 10*3/uL — ABNORMAL HIGH (ref 4.0–10.5)

## 2017-12-30 LAB — COMPREHENSIVE METABOLIC PANEL
ALBUMIN: 4.1 g/dL (ref 3.5–5.0)
ALT: 41 U/L (ref 0–44)
ANION GAP: 17 — AB (ref 5–15)
AST: 53 U/L — AB (ref 15–41)
Alkaline Phosphatase: 90 U/L (ref 38–126)
BUN: 12 mg/dL (ref 8–23)
CO2: 21 mmol/L — AB (ref 22–32)
Calcium: 10.1 mg/dL (ref 8.9–10.3)
Chloride: 104 mmol/L (ref 98–111)
Creatinine, Ser: 0.86 mg/dL (ref 0.61–1.24)
GFR calc Af Amer: >60 mL/min (ref >60)
GFR calc non Af Amer: >60 mL/min (ref >60)
GLUCOSE: 172 mg/dL — AB (ref 70–99)
POTASSIUM: 3.6 mmol/L (ref 3.5–5.1)
SODIUM: 142 mmol/L (ref 135–145)
Total Bilirubin: 1.4 mg/dL — ABNORMAL HIGH (ref 0.3–1.2)
Total Protein: 8 g/dL (ref 6.5–8.1)

## 2017-12-30 LAB — TROPONIN I
TROPONIN I: 0.76 ng/mL — AB (ref <0.03)
TROPONIN I: 0.86 ng/mL — AB (ref <0.03)
Troponin I: 0.48 ng/mL (ref <0.03)
Troponin I: 0.54 ng/mL (ref <0.03)
Troponin I: 0.88 ng/mL (ref <0.03)

## 2017-12-30 LAB — URINALYSIS, ROUTINE W REFLEX MICROSCOPIC
BILIRUBIN URINE: NEGATIVE
Bacteria, UA: NONE SEEN
GLUCOSE, UA: NEGATIVE mg/dL
KETONES UR: NEGATIVE mg/dL
LEUKOCYTES UA: NEGATIVE
Nitrite: NEGATIVE
PROTEIN: 30 mg/dL — AB
Specific Gravity, Urine: 1.01 (ref 1.005–1.030)
pH: 5 (ref 5.0–8.0)

## 2017-12-30 LAB — I-STAT TROPONIN, ED: TROPONIN I, POC: 0.07 ng/mL (ref 0.00–0.08)

## 2017-12-30 LAB — BLOOD GAS, ARTERIAL
ACID-BASE EXCESS: 0.5 mmol/L (ref 0.0–2.0)
Bicarbonate: 24 mmol/L (ref 20.0–28.0)
Drawn by: 441351
O2 CONTENT: 2 L/min
O2 SAT: 94.5 %
PCO2 ART: 34.4 mmHg (ref 32.0–48.0)
PH ART: 7.458 — AB (ref 7.350–7.450)
Patient temperature: 98.6
pO2, Arterial: 72.5 mmHg — ABNORMAL LOW (ref 83.0–108.0)

## 2017-12-30 LAB — HEMOGLOBIN A1C
Hgb A1c MFr Bld: 5.4 % (ref 4.8–5.6)
MEAN PLASMA GLUCOSE: 108.28 mg/dL

## 2017-12-30 LAB — LACTIC ACID, PLASMA
Lactic Acid, Venous: 1.7 mmol/L (ref 0.5–1.9)
Lactic Acid, Venous: 1.8 mmol/L (ref 0.5–1.9)

## 2017-12-30 LAB — ECHOCARDIOGRAM COMPLETE
Height: 71 in
Weight: 4240 oz

## 2017-12-30 LAB — URIC ACID: URIC ACID, SERUM: 8.3 mg/dL (ref 3.7–8.6)

## 2017-12-30 LAB — CBG MONITORING, ED: Glucose-Capillary: 116 mg/dL — ABNORMAL HIGH (ref 70–99)

## 2017-12-30 LAB — GLUCOSE, CAPILLARY
GLUCOSE-CAPILLARY: 110 mg/dL — AB (ref 70–99)
GLUCOSE-CAPILLARY: 149 mg/dL — AB (ref 70–99)
Glucose-Capillary: 133 mg/dL — ABNORMAL HIGH (ref 70–99)

## 2017-12-30 LAB — TSH: TSH: 1.313 u[IU]/mL (ref 0.350–4.500)

## 2017-12-30 LAB — PROCALCITONIN: Procalcitonin: 0.62 ng/mL

## 2017-12-30 LAB — BRAIN NATRIURETIC PEPTIDE: B NATRIURETIC PEPTIDE 5: 258.5 pg/mL — AB (ref 0.0–100.0)

## 2017-12-30 MED ORDER — IPRATROPIUM-ALBUTEROL 0.5-2.5 (3) MG/3ML IN SOLN
3.0000 mL | Freq: Four times a day (QID) | RESPIRATORY_TRACT | Status: DC | PRN
  Administered 2017-12-30 – 2017-12-31 (×3): 3 mL via RESPIRATORY_TRACT
  Filled 2017-12-30 (×4): qty 3

## 2017-12-30 MED ORDER — FUROSEMIDE 10 MG/ML IJ SOLN
40.0000 mg | Freq: Once | INTRAMUSCULAR | Status: AC
  Administered 2017-12-30: 40 mg via INTRAVENOUS
  Filled 2017-12-30: qty 4

## 2017-12-30 MED ORDER — ATORVASTATIN CALCIUM 40 MG PO TABS
40.0000 mg | ORAL_TABLET | Freq: Every evening | ORAL | Status: DC
  Administered 2017-12-30 – 2018-01-04 (×6): 40 mg via ORAL
  Filled 2017-12-30 (×6): qty 1

## 2017-12-30 MED ORDER — INSULIN ASPART 100 UNIT/ML ~~LOC~~ SOLN
0.0000 [IU] | Freq: Three times a day (TID) | SUBCUTANEOUS | Status: DC
  Administered 2017-12-30 – 2017-12-31 (×2): 2 [IU] via SUBCUTANEOUS
  Administered 2017-12-31: 3 [IU] via SUBCUTANEOUS
  Administered 2017-12-31: 2 [IU] via SUBCUTANEOUS

## 2017-12-30 MED ORDER — BISACODYL 10 MG RE SUPP: 10.0000 mg | Freq: Every day | RECTAL | Status: DC | PRN

## 2017-12-30 MED ORDER — ONDANSETRON HCL 4 MG PO TABS: 4.0000 mg | ORAL_TABLET | Freq: Four times a day (QID) | ORAL | Status: DC | PRN

## 2017-12-30 MED ORDER — SENNOSIDES-DOCUSATE SODIUM 8.6-50 MG PO TABS
1.0000 | ORAL_TABLET | Freq: Every evening | ORAL | Status: DC | PRN
  Administered 2018-01-01: 1 via ORAL
  Filled 2017-12-30: qty 1

## 2017-12-30 MED ORDER — PANTOPRAZOLE SODIUM 40 MG PO TBEC
40.0000 mg | DELAYED_RELEASE_TABLET | Freq: Every day | ORAL | Status: DC
  Administered 2017-12-30 – 2018-01-05 (×7): 40 mg via ORAL
  Filled 2017-12-30 (×7): qty 1

## 2017-12-30 MED ORDER — THIAMINE HCL 100 MG/ML IJ SOLN: 100.0000 mg | Freq: Every day | INTRAMUSCULAR | Status: DC

## 2017-12-30 MED ORDER — FUROSEMIDE 10 MG/ML IJ SOLN
40.0000 mg | Freq: Two times a day (BID) | INTRAMUSCULAR | Status: DC
  Filled 2017-12-30: qty 4

## 2017-12-30 MED ORDER — AMLODIPINE BESYLATE 10 MG PO TABS
10.0000 mg | ORAL_TABLET | Freq: Every day | ORAL | Status: DC
  Administered 2017-12-31 – 2018-01-05 (×6): 10 mg via ORAL
  Filled 2017-12-30 (×6): qty 1

## 2017-12-30 MED ORDER — VITAMIN B-1 100 MG PO TABS
100.0000 mg | ORAL_TABLET | Freq: Every day | ORAL | Status: DC
  Administered 2017-12-30 – 2018-01-05 (×7): 100 mg via ORAL
  Filled 2017-12-30 (×7): qty 1

## 2017-12-30 MED ORDER — GABAPENTIN 300 MG PO CAPS
300.0000 mg | ORAL_CAPSULE | Freq: Three times a day (TID) | ORAL | Status: DC
  Administered 2017-12-30 – 2018-01-05 (×18): 300 mg via ORAL
  Filled 2017-12-30 (×18): qty 1

## 2017-12-30 MED ORDER — FOLIC ACID 1 MG PO TABS
1.0000 mg | ORAL_TABLET | Freq: Every day | ORAL | Status: DC
  Administered 2017-12-30 – 2018-01-05 (×7): 1 mg via ORAL
  Filled 2017-12-30 (×7): qty 1

## 2017-12-30 MED ORDER — SODIUM CHLORIDE 0.9 % IV SOLN
500.0000 mg | Freq: Every day | INTRAVENOUS | Status: DC
  Administered 2017-12-30 – 2017-12-31 (×2): 500 mg via INTRAVENOUS
  Filled 2017-12-30 (×2): qty 500

## 2017-12-30 MED ORDER — LORAZEPAM 2 MG/ML IJ SOLN
1.0000 mg | Freq: Four times a day (QID) | INTRAMUSCULAR | Status: AC | PRN
  Administered 2017-12-31: 1 mg via INTRAVENOUS
  Filled 2017-12-30: qty 1

## 2017-12-30 MED ORDER — ASPIRIN 81 MG PO CHEW
324.0000 mg | CHEWABLE_TABLET | Freq: Once | ORAL | Status: DC
  Filled 2017-12-30: qty 4

## 2017-12-30 MED ORDER — FUROSEMIDE 10 MG/ML IJ SOLN
40.0000 mg | Freq: Every day | INTRAMUSCULAR | Status: DC
  Administered 2017-12-31 – 2018-01-02 (×3): 40 mg via INTRAVENOUS
  Filled 2017-12-30 (×3): qty 4

## 2017-12-30 MED ORDER — ENOXAPARIN SODIUM 40 MG/0.4ML ~~LOC~~ SOLN
40.0000 mg | SUBCUTANEOUS | Status: DC
  Administered 2017-12-30 – 2018-01-05 (×5): 40 mg via SUBCUTANEOUS
  Filled 2017-12-30 (×5): qty 0.4

## 2017-12-30 MED ORDER — LORAZEPAM 1 MG PO TABS
1.0000 mg | ORAL_TABLET | Freq: Four times a day (QID) | ORAL | Status: AC | PRN
  Administered 2017-12-30: 1 mg via ORAL
  Filled 2017-12-30: qty 1

## 2017-12-30 MED ORDER — GUAIFENESIN ER 600 MG PO TB12
600.0000 mg | ORAL_TABLET | Freq: Two times a day (BID) | ORAL | Status: DC | PRN
  Filled 2017-12-30: qty 1

## 2017-12-30 MED ORDER — ADULT MULTIVITAMIN W/MINERALS CH
1.0000 | ORAL_TABLET | Freq: Every day | ORAL | Status: DC
  Administered 2017-12-30 – 2018-01-05 (×7): 1 via ORAL
  Filled 2017-12-30 (×7): qty 1

## 2017-12-30 MED ORDER — ONDANSETRON HCL 4 MG/2ML IJ SOLN: 4.0000 mg | Freq: Four times a day (QID) | INTRAMUSCULAR | Status: DC | PRN

## 2017-12-30 MED ORDER — SODIUM CHLORIDE 0.9 % IV SOLN
1.0000 g | Freq: Every day | INTRAVENOUS | Status: DC
  Administered 2017-12-30 – 2018-01-05 (×7): 1 g via INTRAVENOUS
  Filled 2017-12-30: qty 1
  Filled 2017-12-30 (×3): qty 10
  Filled 2017-12-30: qty 1
  Filled 2017-12-30 (×2): qty 10

## 2017-12-30 MED ORDER — IOPAMIDOL (ISOVUE-370) INJECTION 76%
100.0000 mL | Freq: Once | INTRAVENOUS | Status: AC | PRN
  Administered 2017-12-30: 100 mL via INTRAVENOUS

## 2017-12-30 MED ORDER — IOPAMIDOL (ISOVUE-370) INJECTION 76%
INTRAVENOUS | Status: AC
  Filled 2017-12-30: qty 100

## 2017-12-30 MED ORDER — LISINOPRIL 40 MG PO TABS
40.0000 mg | ORAL_TABLET | Freq: Every day | ORAL | Status: DC
  Administered 2017-12-31 – 2018-01-05 (×6): 40 mg via ORAL
  Filled 2017-12-30 (×6): qty 1

## 2017-12-30 MED ORDER — HYDROCODONE-ACETAMINOPHEN 5-325 MG PO TABS
1.0000 | ORAL_TABLET | ORAL | Status: DC | PRN
  Administered 2017-12-30 – 2017-12-31 (×3): 2 via ORAL
  Administered 2018-01-01: 1 via ORAL
  Administered 2018-01-02 – 2018-01-03 (×3): 2 via ORAL
  Administered 2018-01-03: 1 via ORAL
  Administered 2018-01-04: 2 via ORAL
  Filled 2017-12-30 (×6): qty 2
  Filled 2017-12-30 (×2): qty 1
  Filled 2017-12-30 (×2): qty 2

## 2017-12-30 MED ORDER — SERTRALINE HCL 50 MG PO TABS
150.0000 mg | ORAL_TABLET | Freq: Every day | ORAL | Status: DC
  Administered 2017-12-31 – 2018-01-05 (×6): 150 mg via ORAL
  Filled 2017-12-30 (×6): qty 1

## 2017-12-30 MED ORDER — CARVEDILOL 25 MG PO TABS
25.0000 mg | ORAL_TABLET | Freq: Two times a day (BID) | ORAL | Status: DC
  Administered 2017-12-31 – 2018-01-05 (×11): 25 mg via ORAL
  Filled 2017-12-30 (×11): qty 1

## 2017-12-30 NOTE — Progress Notes (Addendum)
Pt's oxygen is 95% in RA but pt is having SOB and oxygen given at 2l just for the comfort, MD notified, expiratory wheezing noted chrge nurse notified, breathing treatment provided, Rapid called by charge nurse.  [name], RN

## 2017-12-30 NOTE — Progress Notes (Addendum)
CT angio chest neg for PE, bibasilar R>L bibasilar airspace opacities , with other scattered nodular opacities  throughout the right lung;  favor infectious/inflammatory over neoplastic.  Borderline left hilar and mediastinal adenopathy, possibly reactive 5.  Aortic Atherosclerosis, . Coronary calcifications.   2 D echo Systolic function was mildly to moderately reduced.  EF  67% to 59%, diastolic unable to be assessed . No clots are seen   Troponin 0.76 in the setting of O2 demand/ pneumonia. Cr normal. EKG with minimal ST changes.  VSS  . Will continue to monitor troponin and repeat EKG , will consider Cards eval in am if any changes seen or if patient is symptomatic, EKG, if continues to be elevated will proceed with ACS workup/ Cards eval. Heart score 4-5  15:34 pm: spoke w Dr. Radford Pax, Cards, who evaluated cardiac data, recommends Limited echo with contrast  To evaluate for coronary calcifications, pending on the results, will be decided on further evaluation in the hospital, possible stress test as outpatient

## 2017-12-30 NOTE — ED Notes (Signed)
Attempted to give report to floor nurse ,floor nurse with patient and not able to take report at this time

## 2017-12-30 NOTE — ED Triage Notes (Signed)
Pt reports he drinks 16-18 beers a day, last drink yesterday morning.

## 2017-12-30 NOTE — Progress Notes (Signed)
Pt is requesting for CPAP to RN, according to him, he needs that when he is taking nap during the day as well,  RN paged MD, waiting for Andrena Mews, RN

## 2017-12-30 NOTE — Progress Notes (Signed)
Echocardiogram 2D Echocardiogram has been performed.  Matilde Bash 12/30/2017, 12:41 PM

## 2017-12-30 NOTE — Progress Notes (Signed)
Pt has a complain of left elbow pain, it looks red, swollen and warm to touch, MD paged, Xray done and lab for uric acid drawn  Lavanya Roa,RN

## 2017-12-30 NOTE — ED Provider Notes (Signed)
[place] EMERGENCY DEPARTMENT Provider Note   CSN: [id number] Arrival date & time: 12/30/17  0355     History   Chief Complaint Chief Complaint  Patient presents with  . Shortness of Breath    HPI Francis Cardenas is a 67 y.o. male.  The history is provided by the patient.  He has a history of diabetes, hypertension, hyperlipidemia, alcohol abuse, CHF and comes in complaining of difficulty breathing.  He woke up at 2 AM and went to watch television, but noted that his breathing was giving him trouble.  He had some tightness in his chest and diaphoresis but no nausea or vomiting.  He has had a cough productive of some clear sputum.  He states that yesterday was a normal day for him.  He is followed in the [facility] for heart failure.  He also is a heavy drinker.  Past Medical History:  Diagnosis Date  . BPH (benign prostatic hyperplasia)   . Cancer [facility])    prostate  . Diabetes mellitus without complication ([place])   . Hypercholesteremia   . Hypertension     Patient Active Problem List   Diagnosis Date Noted  . Alcohol dependence ([place]) 03/26/2013  . PTSD (post-traumatic stress disorder) 03/26/2013  . Alcohol abuse 03/24/2013  . Depressive disorder 03/24/2013    Past Surgical History:  Procedure Laterality Date  . ACHILLES TENDON REPAIR Left   . KNEE ARTHROSCOPY Left   . TONSILLECTOMY    . WRIST FRACTURE SURGERY Left         Home Medications    Prior to Admission medications   Medication Sig Start Date End Date Taking? Authorizing Provider  albuterol (PROVENTIL HFA;VENTOLIN HFA) 108 (90 BASE) MCG/ACT inhaler Inhale 2 puffs into the lungs every 6 (six) hours as needed for wheezing.    [provider]  aspirin EC 81 MG tablet Take 81 mg by mouth daily.    [provider]  carvedilol (COREG) 25 MG tablet Take 1 tablet (25 mg total) by mouth 2 (two) times daily with a meal. For hypertension.  03/31/13   Ruben Im, PA-C  furosemide (LASIX) 20 MG tablet Take 1 tablet (20 mg total) by mouth 2 (two) times daily. For edema. 03/31/13   Ruben Im, PA-C  lisinopril (PRINIVIL,ZESTRIL) 40 MG tablet Take 1 tablet (40 mg total) by mouth daily. For hypertension. 03/31/13   Ruben Im, PA-C  omeprazole (PRILOSEC) 20 MG capsule Take 1 capsule (20 mg total) by mouth daily. For reflux 03/31/13   Nena Polio T, PA-C  pravastatin (PRAVACHOL) 40 MG tablet Take 2 tablets (80 mg total) by mouth at bedtime. For dyslipidemia. 03/31/13   Ruben Im, PA-C  sertraline (ZOLOFT) 100 MG tablet Take one pill by mouth each day for depression and anxiety. 03/31/13   Ruben Im, PA-C  spironolactone (ALDACTONE) 25 MG tablet Take 1 tablet (25 mg total) by mouth 2 (two) times daily. For hypertension and edema. 03/31/13   Ruben Im, PA-C    Family History No family history on file.  Social History Social History   Tobacco Use  . Smoking status: Former Research scientist (life sciences)  . Smokeless tobacco: Never Used  Substance Use Topics  . Alcohol use: Yes    Alcohol/week: 18.0 standard drinks    Types: 18 Cans of beer per week    Comment: 18 cans beer/day  . Drug use: No     Allergies   Patient  has no known allergies.   Review of Systems Review of Systems  All other systems reviewed and are negative.    Physical Exam Updated Vital Signs BP 111/89 (BP Location: Right Arm)   Pulse (!) 101   Temp 97.7 F (36.5 C) (Axillary)   Resp (!) 30   Ht 5\' 11"  (1.803 m)   Wt 120.2 kg   SpO2 97%   BMI 36.96 kg/m   Physical Exam  Nursing note and vitals reviewed.  67 year old male, resting comfortably and in no acute distress. Vital signs are significant for tachypnea. Oxygen saturation is 97%, which is normal.  He is currently on BiPAP. Head is normocephalic and atraumatic. PERRLA, EOMI. Oropharynx is clear. Neck is nontender and supple without adenopathy or JVD. Back is nontender and  there is no CVA tenderness. Lungs have bibasilar rales without wheezes or rhonchi. Chest is nontender. Heart has regular rate and rhythm without murmur. Abdomen is soft, flat, nontender without masses or hepatosplenomegaly and peristalsis is normoactive. Extremities have trace edema, full range of motion is present. Skin is warm and dry without rash. Neurologic: Mental status is normal, cranial nerves are intact, there are no motor or sensory deficits.  ED Treatments / Results  Labs (all labs ordered are listed, but only abnormal results are displayed) Labs Reviewed  COMPREHENSIVE METABOLIC PANEL - Abnormal; Notable for the following components:      Result Value   CO2 21 (*)    Glucose, Bld 172 (*)    AST 53 (*)    Total Bilirubin 1.4 (*)    Anion gap 17 (*)    All other components within normal limits  CBC WITH DIFFERENTIAL/PLATELET - Abnormal; Notable for the following components:   WBC 12.3 (*)    Neutro Abs 11.1 (*)    Lymphs Abs 0.6 (*)    All other components within normal limits  BRAIN NATRIURETIC PEPTIDE  I-STAT TROPONIN, ED    EKG EKG Interpretation  Date/Time:  Saturday December 30 2017 04:05:20 EDT Ventricular Rate:  101 PR Interval:    QRS Duration: 110 QT Interval:  384 QTC Calculation: 498 R Axis:   78 Text Interpretation:  Sinus tachycardia Premature ventricular complexes Borderline ST depression, diffuse leads Borderline prolonged QT interval Baseline wander in lead(s) V3 No old tracing to compare Confirmed by Delora Fuel ([id number]) on 12/30/2017 4:39:14 AM   Radiology Dg Chest Portable 1 View  Result Date: 12/30/2017 CLINICAL DATA:  Acute onset of shortness of breath. Diaphoresis and tachypnea. EXAM: PORTABLE CHEST 1 VIEW COMPARISON:  Chest radiograph performed 03/11/2004 FINDINGS: The lungs are well-aerated. Right basilar airspace opacity may reflect pneumonia or asymmetric pulmonary edema. Mild vascular congestion is noted. There is no evidence of pleural  effusion or pneumothorax. The cardiomediastinal silhouette is within normal limits. No acute osseous abnormalities are seen. IMPRESSION: Right basilar airspace opacity may reflect pneumonia or asymmetric pulmonary edema. Mild vascular congestion noted. Electronically Signed   By: Garald Balding M.D.   On: 12/30/2017 04:46    Procedures Procedures  CRITICAL CARE Performed by: Delora Fuel Total critical care time: 55 minutes Critical care time was exclusive of separately billable procedures and treating other patients. Critical care was necessary to treat or prevent imminent or life-threatening deterioration. Critical care was time spent personally by me on the following activities: development of treatment plan with patient and/or surrogate as well as nursing, discussions with consultants, evaluation of patient's response to treatment, examination of patient, obtaining history from  patient or surrogate, ordering and performing treatments and interventions, ordering and review of laboratory studies, ordering and review of radiographic studies, pulse oximetry and re-evaluation of patient's condition.  Medications Ordered in ED Medications  furosemide (LASIX) injection 40 mg (has no administration in time range)  aspirin chewable tablet 324 mg (has no administration in time range)     Initial Impression / Assessment and Plan / ED Course  I have reviewed the triage vital signs and the nursing notes.  Pertinent labs & imaging results that were available during my care of the patient were reviewed by me and considered in my medical decision making (see chart for details).  Dyspnea which appears to be CHF exacerbation based on physical exam.  ECG shows no acute changes, doubt ACS.  However, will check troponin level as well as BNP.  Chest x-ray does appear to show mild pulmonary edema per my reading subjective final interpretation by radiologist.  He will be given a dose of aspirin and intravenous  furosemide.  Old records are reviewed, and he has no relevant past visits.  Radiologist agrees with likely CHF.  Patient is doing well on BiPAP and with diuresis and will be tried off of BiPAP.  Labs are significant for mild elevation of AST.  Case is discussed with Legrand Pitts of [facility], who agrees to admit the patient.  Final Clinical Impressions(s) / ED Diagnoses   Final diagnoses:  Acute on chronic congestive heart failure, unspecified heart failure type ([place])  Elevated AST (SGOT)    ED Discharge Orders    None       Delora Fuel, MD 94/17/40 [phone]

## 2017-12-30 NOTE — Progress Notes (Signed)
Paged Sharene Butters, [place] after MD waiting for the response,  Meanwhile pt is getting breathing treatment Spo2 is 94% in 2l via [place], RN

## 2017-12-30 NOTE — ED Triage Notes (Signed)
Pt arrives via EMS with shortness of breath starting this evening. Progressively worse until he decided to have his wife call 911. Pt cool, clammy, diaphoretic and breathing 50 times a minute upon EMS arrival. EMS gave sl nitro to manage hypertension - 200/palp.

## 2017-12-30 NOTE — ED Notes (Signed)
Pt off of BIPAP at this time , pt denies any SOB and states " I feel a lot better at this time " pts 02 sats around 93-95%

## 2017-12-30 NOTE — Progress Notes (Signed)
Living better with heart failure book provided and educated the patient  Francis Cardenas, [place]

## 2017-12-30 NOTE — ED Notes (Signed)
Echo at bedside

## 2017-12-30 NOTE — Progress Notes (Signed)
New pt admission from ED. Pt brought to the floor in stable condition. Vitals taken. Initial Assessment done. All immediate pertinent needs to patient addressed. Patient Guide given to patient. Important safety instructions relating to hospitalization reviewed with patient. Patient verbalized understanding. Will continue to monitor pt.  Tayra Dawe, RN 

## 2017-12-30 NOTE — Progress Notes (Signed)
Called RT regarding CPAP  [name], Therapist, sports

## 2017-12-30 NOTE — H&P (Addendum)
History and Physical    Francis Cardenas ZDG:[id number] DOB: December 23, 1950 DOA: 12/30/2017   PCP: System, Provider Not In   Patient coming from:  Home    Chief Complaint: Shortness of Breath   HPI: Francis Cardenas is a 67 y.o. male with medical history significant for diabetes, hypertension, hyperlipidemia, congestive heart failure, and a history of alcohol abuse with last admitted admission to behavioral hospital in 2014, whose care is followed at the [place] [facility] in [place], presenting with acute dyspnea  since 2 in the morning, when he went to watch television, but noted that he had trouble breathing, taking a pulse ox at his home, which yielded at 78%.  "feeling tightness in his chest, with diaphoresis, without nausea or vomiting. Now resolved. He denies pleuritic chest pain denies any sick contacts.  He denies any hemoptysis.  The patient denies any history of PE or DVT in the past.  He does have some cough, with clear sputum.  His last drink was yesterday.  The patient drinks anywhere from 7 to 14 cans of beer a day, especially over the last 7 years, after he retired.  He has been increasingly dependent of alcohol, and he feels that this has contributed to his symptoms.  He is more sedentary.  The patient admits to food indiscretion, salty foods.   He denies any abdominal pain, dysuria, hematuria, worsening lower extremity swelling, or calf pain.  He denies any recent long distance trips.  He denies any new medications he is very compliant with them.  He takes and aspirin a day. He denies any hormonal supplements.  No confusion is reported.  However, he appears more anxious, he admits that he my have "withdrawal".  ED Course:  BP 136/73   Pulse 80   Temp 97.7 F (36.5 C) (Axillary)   Resp 16   Ht 5\' 11"  (1.803 m)   Wt 120.2 kg   SpO2 92%   BMI 36.96 kg/m   Bicarb was 21, with anion gap of 17. BMP was 258.5 Troponin  0.07 White count 12.3, hemoglobin 14.4, platelets 166. Glucose was 172 Total  bilirubin was 1.4. EKG shows Sinus tachycardia,Premature ventricular complexes Borderline ST depression, diffuse leads Borderline prolonged QT interval Baseline wander in lead(s) V3No old tracing to compare.  Troponin was 0.07, being trended. Chest x-ray showed right basilar airspace opacity, may be reflecting pneumonia versus asymmetric pulmonary edema, most likely fever in the last 1, with mild vascular congestion noted. He has placed on BiPAP, which eventually weaned off, he is at 2 L of oxygen. The patient received IV Lasix 40 mg, beginning to diurese well.  Review of Systems:  As per HPI otherwise all other systems reviewed and are negative  Past Medical History:  Diagnosis Date  . BPH (benign prostatic hyperplasia)   . Cancer [facility])    prostate  . Diabetes mellitus without complication ([place])   . Hypercholesteremia   . Hypertension     Past Surgical History:  Procedure Laterality Date  . ACHILLES TENDON REPAIR Left   . KNEE ARTHROSCOPY Left   . TONSILLECTOMY    . WRIST FRACTURE SURGERY Left     Social History Social History   Socioeconomic History  . Marital status: Married    Spouse name: Not on file  . Number of children: Not on file  . Years of education: Not on file  . Highest education level: Not on file  Occupational History  . Not on file  Social Needs  .  Financial resource strain: Not on file  . Food insecurity:    Worry: Not on file    Inability: Not on file  . Transportation needs:    Medical: Not on file    Non-medical: Not on file  Tobacco Use  . Smoking status: Former Research scientist (life sciences)  . Smokeless tobacco: Never Used  Substance and Sexual Activity  . Alcohol use: Yes    Alcohol/week: 18.0 standard drinks    Types: 18 Cans of beer per week    Comment: 18 cans beer/day  . Drug use: No  . Sexual activity: Not Currently  Lifestyle  . Physical activity:    Days per week: Not on file    Minutes per session: Not on file  . Stress: Not on file    Relationships  . Social connections:    Talks on phone: Not on file    Gets together: Not on file    Attends religious service: Not on file    Active member of club or organization: Not on file    Attends meetings of clubs or organizations: Not on file    Relationship status: Not on file  . Intimate partner violence:    Fear of current or ex partner: Not on file    Emotionally abused: Not on file    Physically abused: Not on file    Forced sexual activity: Not on file  Other Topics Concern  . Not on file  Social History Narrative  . Not on file     No Known Allergies  No family history on file.     Prior to Admission medications   Medication Sig Start Date End Date Taking? Authorizing Provider  albuterol (PROVENTIL HFA;VENTOLIN HFA) 108 (90 BASE) MCG/ACT inhaler Inhale 2 puffs into the lungs every 6 (six) hours as needed for wheezing.   Yes [provider]  amLODipine (NORVASC) 10 MG tablet Take 10 mg by mouth daily.   Yes [provider]  atorvastatin (LIPITOR) 40 MG tablet Take 40 mg by mouth every evening.   Yes [provider]  carvedilol (COREG) 25 MG tablet Take 1 tablet (25 mg total) by mouth 2 (two) times daily with a meal. For hypertension. 03/31/13  Yes Mashburn, Marlane Hatcher, PA-C  furosemide (LASIX) 40 MG tablet Take 40 mg by mouth daily.   Yes [provider]  gabapentin (NEURONTIN) 300 MG capsule Take 300 mg by mouth 3 (three) times daily.   Yes [provider]  lisinopril (PRINIVIL,ZESTRIL) 40 MG tablet Take 1 tablet (40 mg total) by mouth daily. For hypertension. 03/31/13  Yes Mashburn, Marlane Hatcher, PA-C  metFORMIN (GLUCOPHAGE) 500 MG tablet Take 500 mg by mouth daily with breakfast.   Yes [provider]  omeprazole (PRILOSEC) 40 MG capsule Take 40 mg by mouth every evening.   Yes [provider]  sertraline (ZOLOFT) 100 MG tablet Take one pill by mouth each day for depression and anxiety. Patient taking  differently: Take 150 mg by mouth daily.  03/31/13  Yes Mashburn, Marlane Hatcher, PA-C  furosemide (LASIX) 20 MG tablet Take 1 tablet (20 mg total) by mouth 2 (two) times daily. For edema. Patient not taking: Reported on 12/30/2017 03/31/13   Nena Polio T, PA-C  omeprazole (PRILOSEC) 20 MG capsule Take 1 capsule (20 mg total) by mouth daily. For reflux Patient not taking: Reported on 12/30/2017 03/31/13   Nena Polio T, PA-C  pravastatin (PRAVACHOL) 40 MG tablet Take 2 tablets (80 mg total) by  mouth at bedtime. For dyslipidemia. Patient not taking: Reported on 12/30/2017 03/31/13   Ruben Im, PA-C  spironolactone (ALDACTONE) 25 MG tablet Take 1 tablet (25 mg total) by mouth 2 (two) times daily. For hypertension and edema. Patient not taking: Reported on 12/30/2017 03/31/13   Pamelia Hoit     Physical Exam:  Vitals:   12/30/17 0645 12/30/17 0700 12/30/17 0715 12/30/17 0800  BP: 106/85 (!) 116/52 126/70 136/73  Pulse: 81 72 74 80  Resp: (!) 25 (!) 21 (!) 21 16  Temp:      TempSrc:      SpO2: 98% 97% 97% 92%  Weight:      Height:       Constitutional: NAD, calm,alert and conversant, somewhat diaphoretic Eyes: PERRL, lids and conjunctivae normal ENMT: Mucous membranes are moist, without exudate or lesions  Neck: normal, supple, no masses, no thyromegaly Respiratory:  , mildly tachypneic, Essentially clear to auscultation bilaterally, no wheezing, bibasilar crackles. Normal respiratory effort  Cardiovascular: Regular rate and rhythm,  murmur, rubs or gallops. No extremity edema. 2+ pedal pulses. No carotid bruits.  Abdomen: Soft, morbidly obese  non tender, No hepatosplenomegaly. Bowel sounds positive.  Musculoskeletal: no clubbing / cyanosis. Moves all extremities Skin: no jaundice, No lesions.  Neurologic: Sensation intact  Strength equal in all extremities Psychiatric:   Alert and oriented x 3. Anxious mood.     Labs on Admission: I have personally reviewed following labs  and imaging studies  CBC: Recent Labs  Lab 12/30/17 0448  WBC 12.3*  NEUTROABS 11.1*  HGB 14.4  HCT 43.4  MCV 92.7  PLT 161    Basic Metabolic Panel: Recent Labs  Lab 12/30/17 0448  NA 142  K 3.6  CL 104  CO2 21*  GLUCOSE 172*  BUN 12  CREATININE 0.86  CALCIUM 10.1    GFR: Estimated Creatinine Clearance: 108.5 mL/min (by C-G formula based on SCr of 0.86 mg/dL).  Liver Function Tests: Recent Labs  Lab 12/30/17 0448  AST 53*  ALT 41  ALKPHOS 90  BILITOT 1.4*  PROT 8.0  ALBUMIN 4.1   No results for input(s): LIPASE, AMYLASE in the last 168 hours. No results for input(s): AMMONIA in the last 168 hours.  Coagulation Profile: No results for input(s): INR, PROTIME in the last 168 hours.  Cardiac Enzymes: No results for input(s): CKTOTAL, CKMB, CKMBINDEX, TROPONINI in the last 168 hours.  BNP (last 3 results) No results for input(s): PROBNP in the last 8760 hours.  HbA1C: Recent Labs    12/30/17 0818  HGBA1C 5.4    CBG: Recent Labs  Lab 12/30/17 0849  GLUCAP 116*    Lipid Profile: No results for input(s): CHOL, HDL, LDLCALC, TRIG, CHOLHDL, LDLDIRECT in the last 72 hours.  Thyroid Function Tests: No results for input(s): TSH, T4TOTAL, FREET4, T3FREE, THYROIDAB in the last 72 hours.  Anemia Panel: No results for input(s): VITAMINB12, FOLATE, FERRITIN, TIBC, IRON, RETICCTPCT in the last 72 hours.  Urine analysis:    Component Value Date/Time   COLORURINE YELLOW 03/24/2013 [place] 03/24/2013 1255   LABSPEC 1.014 03/24/2013 1255   PHURINE 6.0 03/24/2013 1255   GLUCOSEU NEGATIVE 03/24/2013 1255   HGBUR NEGATIVE 03/24/2013 [place] 03/24/2013 1255   KETONESUR NEGATIVE 03/24/2013 1255   PROTEINUR NEGATIVE 03/24/2013 1255   UROBILINOGEN 0.2 03/24/2013 1255   NITRITE NEGATIVE 03/24/2013 1255   LEUKOCYTESUR NEGATIVE 03/24/2013 1255    Sepsis Labs: @LABRCNTIP (procalcitonin:4,lacticidven:4) )  No results  found for this or any previous visit (from the past 240 hour(s)).   Radiological Exams on Admission: Dg Chest Portable 1 View  Result Date: 12/30/2017 CLINICAL DATA:  Acute onset of shortness of breath. Diaphoresis and tachypnea. EXAM: PORTABLE CHEST 1 VIEW COMPARISON:  Chest radiograph performed 03/11/2004 FINDINGS: The lungs are well-aerated. Right basilar airspace opacity may reflect pneumonia or asymmetric pulmonary edema. Mild vascular congestion is noted. There is no evidence of pleural effusion or pneumothorax. The cardiomediastinal silhouette is within normal limits. No acute osseous abnormalities are seen. IMPRESSION: Right basilar airspace opacity may reflect pneumonia or asymmetric pulmonary edema. Mild vascular congestion noted. Electronically Signed   By: Garald Balding M.D.   On: 12/30/2017 04:46    EKG: Independently reviewed.  Assessment/Plan Principal Problem:   Acute respiratory failure with hypoxia (HCC) Active Problems:   Acute exacerbation of CHF (congestive heart failure) (HCC)   Alcohol abuse   Depressive disorder   PTSD (post-traumatic stress disorder)   Benign essential hypertension   Carcinoma of prostate (HCC)   COPD (chronic obstructive pulmonary disease) (HCC)   GERD (gastroesophageal reflux disease)   Hyperlipidemia   Peripheral neuropathy   Steatosis of liver   Type 2 diabetes mellitus with other specified complication (HCC)   Chest pain syndrome      Acute hypoxic respiratory failure with hypoxia, rule our Acute on chronic diastolic CHF versus PE, versus pneumonia on initial presentation, the patient was hypoxic at 78%, requiring BiPAP, now on 2 L of oxygen, tachycardic, diaphoretic, and his troponin was 0.07, with some changes in his EKG, with sinus tachycardia, PVCs, and borderline ST depression Bicarb was 21, with anion gap of 17.  Chest x-ray showed right basilar airspace opacity, may be reflecting pneumonia versus asymmetric pulmonary edema, most  likely fever in the last 1, with mild vascular congestion noted.Marland Kitchen BNP was 258, low suspicion for CHF exacerbation.  White count was 12.3.  He received IV Lasix 40 mg, with diuresing well in the ER.  At this time, the patient is more stable clinically, no ABGs were taken at that time of arrival.   Place  telemetry obs    2 D echo  IV Lasix 40 Qd  Strict I/Os Daily weights Continue oxygen nasal cannula Will obtain CT Angio of the chest, rule out PE Serial troponin Repeat EKG For abnormal x-ray results, suggestive of possible pneumonia obtain blood cultures Mucinex as needed We will cover with Zithromax and Rocephin DuoNeb every 6 hours as needed for wheezing   Alcohol abuse and dependence and at risk for withdrawal.  Total bilirubin 1.4, AST in the 50s.  Patient has a history of steatosis of the liver  Telemetry  CIWA with Ativan per protocol  Thiamine, folate, and MVI   Hypertension BP 136/73   Pulse 80  Continue home anti-hypertensive medications    Hyperlipidemia Continue home statins  Depression Continue home  Zoloft  Type II Diabetes Current blood sugar level is 172 Lab Results  Component Value Date   HGBA1C 5.4 12/30/2017  Hold home oral diabetic medications.  SSI Continue Neurontin  GERD, no acute symptoms Continue PPI  DVT prophylaxis:  Lovenox Code Status:    FUll code  Family Communication:  Discussed with patient Disposition Plan: Expect patient to be discharged to home after condition improves Consults called:    None  Admission status: Tele OBs    Sharene Butters, PA-C [facility]   Amion text  [phone]   12/30/2017,  9:05 AM

## 2017-12-30 NOTE — ED Notes (Signed)
Remains on bipap. Will admin aspirin when pt can come off.

## 2017-12-30 NOTE — Significant Event (Signed)
Rapid Response Event Note  Overview: Time Called: 1720 Arrival Time: 1847 Event Type: Respiratory  Initial Focused Assessment: Patient with acute SOB when OOB to use the bathroom. Improved with breathing treatment. Upon my arrival sitting on the side of the bed, mildly SOB per patient he is at his baseline.  Lung sounds clear. BP 140/80 HR 84  RR 20 O2 sat 94% on 2L [place]  Interventions: ABG done  7.4/34/72 Placed on night time CPap  Plan of Care (if not transferred): RN to call if assistance needed  Event Summary: Name of Physician Notified: [place] at 1844    at    Outcome: Stayed in room and stabalized  Event End Time: 1910  Raliegh Ip

## 2017-12-30 NOTE — ED Notes (Addendum)
Admitting  aware of troponin ; no further orders received at this time

## 2017-12-30 NOTE — Progress Notes (Signed)
RT set up CPAP and placed on patient with patient's home settings and 2L O2 bled into circuit. Patient tolerating well at this time. RT will monitor as needed.

## 2017-12-30 NOTE — Progress Notes (Signed)
ABG requested by MD, RT called  [name], RN

## 2017-12-30 NOTE — Progress Notes (Signed)
RN added pulse ox to cardiac monitor.

## 2017-12-30 NOTE — Progress Notes (Signed)
Pt's CIWA score is 12, lorazepam 1mg  provided, oxygen saturation is 100% in RA but also 2l oxygen given via [place] just to provide comfort to the patient as pt seems a lot anxious  [name], Therapist, sports

## 2017-12-30 NOTE — ED Notes (Signed)
Ordered patient a meal trey

## 2017-12-30 NOTE — Progress Notes (Signed)
Patient arrived via EMS on CPAP. Diaphoretic,increased WOB, SOB. Placed on BIPAP.

## 2017-12-31 ENCOUNTER — Observation Stay (HOSPITAL_COMMUNITY): Payer: No Typology Code available for payment source

## 2017-12-31 DIAGNOSIS — N400 Enlarged prostate without lower urinary tract symptoms: Secondary | ICD-10-CM | POA: Diagnosis present

## 2017-12-31 DIAGNOSIS — L03114 Cellulitis of left upper limb: Secondary | ICD-10-CM | POA: Diagnosis present

## 2017-12-31 DIAGNOSIS — J181 Lobar pneumonia, unspecified organism: Secondary | ICD-10-CM | POA: Diagnosis present

## 2017-12-31 DIAGNOSIS — E1140 Type 2 diabetes mellitus with diabetic neuropathy, unspecified: Secondary | ICD-10-CM | POA: Diagnosis present

## 2017-12-31 DIAGNOSIS — I509 Heart failure, unspecified: Secondary | ICD-10-CM

## 2017-12-31 DIAGNOSIS — Z7982 Long term (current) use of aspirin: Secondary | ICD-10-CM | POA: Diagnosis not present

## 2017-12-31 DIAGNOSIS — I248 Other forms of acute ischemic heart disease: Secondary | ICD-10-CM | POA: Diagnosis present

## 2017-12-31 DIAGNOSIS — J9601 Acute respiratory failure with hypoxia: Secondary | ICD-10-CM | POA: Diagnosis present

## 2017-12-31 DIAGNOSIS — K760 Fatty (change of) liver, not elsewhere classified: Secondary | ICD-10-CM | POA: Diagnosis present

## 2017-12-31 DIAGNOSIS — K219 Gastro-esophageal reflux disease without esophagitis: Secondary | ICD-10-CM | POA: Diagnosis present

## 2017-12-31 DIAGNOSIS — M109 Gout, unspecified: Secondary | ICD-10-CM | POA: Diagnosis present

## 2017-12-31 DIAGNOSIS — F1010 Alcohol abuse, uncomplicated: Secondary | ICD-10-CM | POA: Diagnosis not present

## 2017-12-31 DIAGNOSIS — Z8546 Personal history of malignant neoplasm of prostate: Secondary | ICD-10-CM | POA: Diagnosis not present

## 2017-12-31 DIAGNOSIS — F1020 Alcohol dependence, uncomplicated: Secondary | ICD-10-CM | POA: Diagnosis present

## 2017-12-31 DIAGNOSIS — F4310 Post-traumatic stress disorder, unspecified: Secondary | ICD-10-CM | POA: Diagnosis present

## 2017-12-31 DIAGNOSIS — M7022 Olecranon bursitis, left elbow: Secondary | ICD-10-CM | POA: Diagnosis present

## 2017-12-31 DIAGNOSIS — Y92230 Patient room in hospital as the place of occurrence of the external cause: Secondary | ICD-10-CM | POA: Diagnosis not present

## 2017-12-31 DIAGNOSIS — E876 Hypokalemia: Secondary | ICD-10-CM | POA: Diagnosis not present

## 2017-12-31 DIAGNOSIS — Z79899 Other long term (current) drug therapy: Secondary | ICD-10-CM | POA: Diagnosis not present

## 2017-12-31 DIAGNOSIS — E785 Hyperlipidemia, unspecified: Secondary | ICD-10-CM | POA: Diagnosis present

## 2017-12-31 DIAGNOSIS — I5021 Acute systolic (congestive) heart failure: Secondary | ICD-10-CM | POA: Diagnosis not present

## 2017-12-31 DIAGNOSIS — Z87891 Personal history of nicotine dependence: Secondary | ICD-10-CM | POA: Diagnosis not present

## 2017-12-31 DIAGNOSIS — J440 Chronic obstructive pulmonary disease with acute lower respiratory infection: Secondary | ICD-10-CM | POA: Diagnosis present

## 2017-12-31 DIAGNOSIS — R740 Nonspecific elevation of levels of transaminase and lactic acid dehydrogenase [LDH]: Secondary | ICD-10-CM | POA: Diagnosis present

## 2017-12-31 DIAGNOSIS — F329 Major depressive disorder, single episode, unspecified: Secondary | ICD-10-CM | POA: Diagnosis present

## 2017-12-31 DIAGNOSIS — T501X5A Adverse effect of loop [high-ceiling] diuretics, initial encounter: Secondary | ICD-10-CM | POA: Diagnosis not present

## 2017-12-31 DIAGNOSIS — I10 Essential (primary) hypertension: Secondary | ICD-10-CM | POA: Diagnosis not present

## 2017-12-31 DIAGNOSIS — I5023 Acute on chronic systolic (congestive) heart failure: Secondary | ICD-10-CM | POA: Diagnosis present

## 2017-12-31 DIAGNOSIS — I110 Hypertensive heart disease with heart failure: Secondary | ICD-10-CM | POA: Diagnosis present

## 2017-12-31 LAB — GLUCOSE, CAPILLARY
GLUCOSE-CAPILLARY: 154 mg/dL — AB (ref 70–99)
Glucose-Capillary: 125 mg/dL — ABNORMAL HIGH (ref 70–99)
Glucose-Capillary: 146 mg/dL — ABNORMAL HIGH (ref 70–99)
Glucose-Capillary: 138 mg/dL — ABNORMAL HIGH (ref 70–99)

## 2017-12-31 LAB — ECHOCARDIOGRAM LIMITED
HEIGHTINCHES: 71 in
Weight: 4518.4 oz

## 2017-12-31 LAB — CBC
HEMATOCRIT: 41.4 % (ref 39.0–52.0)
HEMOGLOBIN: 13.4 g/dL (ref 13.0–17.0)
MCH: 30.5 pg (ref 26.0–34.0)
MCHC: 32.4 g/dL (ref 30.0–36.0)
MCV: 94.3 fL (ref 78.0–100.0)
Platelets: 175 10*3/uL (ref 150–400)
RBC: 4.39 MIL/uL (ref 4.22–5.81)
RDW: 13.9 % (ref 11.5–15.5)
WBC: 11 10*3/uL — ABNORMAL HIGH (ref 4.0–10.5)

## 2017-12-31 LAB — BASIC METABOLIC PANEL
ANION GAP: 12 (ref 5–15)
BUN: 12 mg/dL (ref 8–23)
CALCIUM: 10.3 mg/dL (ref 8.9–10.3)
CO2: 26 mmol/L (ref 22–32)
Chloride: 103 mmol/L (ref 98–111)
Creatinine, Ser: 0.82 mg/dL (ref 0.61–1.24)
GFR calc Af Amer: >60 mL/min (ref >60)
GFR calc non Af Amer: >60 mL/min (ref >60)
GLUCOSE: 139 mg/dL — AB (ref 70–99)
POTASSIUM: 3.1 mmol/L — AB (ref 3.5–5.1)
Sodium: 141 mmol/L (ref 135–145)

## 2017-12-31 LAB — TROPONIN I: Troponin I: 0.99 ng/mL (ref <0.03)

## 2017-12-31 MED ORDER — POTASSIUM CHLORIDE CRYS ER 20 MEQ PO TBCR
40.0000 meq | EXTENDED_RELEASE_TABLET | Freq: Once | ORAL | Status: AC
  Administered 2017-12-31: 40 meq via ORAL
  Filled 2017-12-31: qty 2

## 2017-12-31 MED ORDER — AZITHROMYCIN 500 MG PO TABS
500.0000 mg | ORAL_TABLET | Freq: Every day | ORAL | Status: AC
  Administered 2018-01-01 – 2018-01-03 (×3): 500 mg via ORAL
  Filled 2017-12-31 (×3): qty 1

## 2017-12-31 MED ORDER — DOXYCYCLINE HYCLATE 100 MG PO TABS
100.0000 mg | ORAL_TABLET | Freq: Two times a day (BID) | ORAL | Status: DC
  Administered 2017-12-31 – 2018-01-01 (×3): 100 mg via ORAL
  Filled 2017-12-31 (×3): qty 1

## 2017-12-31 MED ORDER — PERFLUTREN LIPID MICROSPHERE
1.0000 mL | INTRAVENOUS | Status: AC | PRN
  Administered 2017-12-31: 2 mL via INTRAVENOUS
  Filled 2017-12-31: qty 10

## 2017-12-31 NOTE — Progress Notes (Signed)
Called to assess left hand.  IV has been removed. .  Hand edematous.  Warm pack applied.

## 2017-12-31 NOTE — Progress Notes (Signed)
IV team consulted just for the second opinion his IV site, paged MD if he needs X-ray to be done, just we are watching this time, warm compress will be provided shortly, this point pt is elevating his arm and pain medicine will be provided shortly  [name], Therapist, sports

## 2017-12-31 NOTE — Progress Notes (Signed)
Pt in in CPAP now larger size mask requested to RT, pt's saturation is 95% RA but still oxygen given for the comfort, IVABX running this time, IV site looks intact, no any other complications noted, pt feels really SOB while using bathroom and moving even saturation maintained, but subsided quickly once pt stable, will continue to monitor  [name], RN

## 2017-12-31 NOTE — Progress Notes (Addendum)
Rn found that pt's IV site was leaking  and half of the canula came out while middle of the IV ABX running and it was CDI earlier on assessment and was well flushed, RN secured that with gauge bandage since pt was keep moving his hand a lot and asked pt try to not to move whole lot. RN took canula out, right now pt still has one on his right AC, in good shape(well flushed),  RN also secured that with bandage, RN used that to finish remaining antibiotics and RN educated patient to lift his left hand to  support and reduce some swelling there by providing him a extra pillow. Pt shows understanding and verbalized that he keeps moving his hand.he has SPO2 machine wire attached to the same hand previously to watch his continuous pulse ox, will continue to monitor the patient  [name], RN

## 2017-12-31 NOTE — Progress Notes (Addendum)
PROGRESS NOTE    Francis Cardenas  ZOX:[id number] DOB: Apr 24, 1951 DOA: 12/30/2017  PCP: System, Provider Not In   Brief Narrative:  67 y.o. male with medical history significant for diabetes, HTN, chronic systolic CHF with EF of 81-19%, alcohol abuse with last admission to behavioral hospital in 2014, whose care is followed at the [facility] in [place] who presented to ED with sudden shortness of breath over past 24 hours PTA. Nebulizer treatments did not help relieve shortness of breath. Of note, the patient drinks anywhere from 7 to 14 cans of beer a day, especially over the last 7 years, after he retired.   Chest x-ray showed right basilar airspace opacity, he was started on azithro and rocephin, BiPAP and lasix IV.   Assessment & Plan:   Principal Problem:   Acute respiratory failure with hypoxia (HCC) / Acute on chronic systolic CHF - CT angio chest negative for PE - ECHO showed EF of 40-45% - Continue lasix 40 mg IV daily - Continue coreg 25 mg  - Continue BiPAP - Continue daily weight and strict intake and output - Weight 128.4 kg  Active Problems:   Right lower lobe pneumonia - CXR showed right basilar opacity - Started on azithro and Rocephin    Left elbow erythema - Possible gout vs bursitis, cellulitis - X ray without acute findings - Follow u purine acid level - Add doxycyline for possible cellulitis    Alcohol abuse - Counseled on cessation  - Continue folic acid, multivitamin, thiamine  - Monitor for withdrawals  - Continue Ativan PRN    Hypokalemia - Due to lasix - Supplemented     Depression - Continue Zoloft     Essential hypertension - Continue Norvasc 10 mg daily, lisinopril 40 mg daily     Dyslipidemia - Continue Lipitor 40 mg daily     Type 2 diabetes mellitus with diabetic neuropathy (HCC) - Continue SSI - Continue gabapentin for neuropathy      DVT prophylaxis: Lovenox subQ Code Status: full code  Family Communication: no family at  bedside this am Disposition Plan: home once resp status stable    Consultants:   None  Procedures:   CPAP  Antimicrobials:   Azithromycin 8/10 -->  Rocephin 8/10 -->  Doxycyline 8/122 -->   Subjective: Feels better this am.  Objective: Vitals:   12/30/17 1936 12/30/17 2044 12/31/17 0107 12/31/17 0614  BP:  (!) 144/72 (!) 143/83 (!) 165/89  Pulse: 92 88 81 (!) 103  Resp: 18 (!) 22 17 20   Temp:  99.2 F (37.3 C) 98 F (36.7 C) 98.3 F (36.8 C)  TempSrc:   Oral Oral  SpO2: 94% 94% 98% 96%  Weight:    128.1 kg  Height:        Intake/Output Summary (Last 24 hours) at 12/31/2017 0918 Last data filed at 12/31/2017 0648 Gross per 24 hour  Intake 1550 ml  Output 1425 ml  Net 125 ml   Filed Weights   12/30/17 0406 12/30/17 1247 12/31/17 0614  Weight: 120.2 kg 128.4 kg 128.1 kg    Examination:  General exam: Appears calm and comfortable  Respiratory system: diminished breath sounds, no wheezing  Cardiovascular system: S1 & S2 heard, RRR.  Gastrointestinal system: Abdomen is distended, nontender. No organomegaly or masses felt. Normal bowel sounds heard. Central nervous system: Alert and oriented. No focal neurological deficits. Extremities: Symmetric 5 x 5 power. Skin: No rashes, lesions or ulcers, left elbow erythema, tender to palpation  Psychiatry: Judgement and insight appear normal. Mood & affect appropriate.   Data Reviewed: I have personally reviewed following labs and imaging studies  CBC: Recent Labs  Lab 12/30/17 0448 12/31/17 0613  WBC 12.3* 11.0*  NEUTROABS 11.1*  --   HGB 14.4 13.4  HCT 43.4 41.4  MCV 92.7 94.3  PLT 166 706   Basic Metabolic Panel: Recent Labs  Lab 12/30/17 0448 12/31/17 0613  NA 142 141  K 3.6 3.1*  CL 104 103  CO2 21* 26  GLUCOSE 172* 139*  BUN 12 12  CREATININE 0.86 0.82  CALCIUM 10.1 10.3   GFR: Estimated Creatinine Clearance: 117.6 mL/min (by C-G formula based on SCr of 0.82 mg/dL). Liver Function  Tests: Recent Labs  Lab 12/30/17 0448  AST 53*  ALT 41  ALKPHOS 90  BILITOT 1.4*  PROT 8.0  ALBUMIN 4.1   No results for input(s): LIPASE, AMYLASE in the last 168 hours. No results for input(s): AMMONIA in the last 168 hours. Coagulation Profile: No results for input(s): INR, PROTIME in the last 168 hours. Cardiac Enzymes: Recent Labs  Lab 12/30/17 1159 12/30/17 1343 12/30/17 1722 12/30/17 2024 12/30/17 2257  TROPONINI 0.54* 0.76* 0.86* 0.88* 0.99*   BNP (last 3 results) No results for input(s): PROBNP in the last 8760 hours. HbA1C: Recent Labs    12/30/17 0818  HGBA1C 5.4   CBG: Recent Labs  Lab 12/30/17 0849 12/30/17 1254 12/30/17 1706 12/30/17 1934 12/31/17 0730  GLUCAP 116* 110* 149* 133* 125*   Lipid Profile: No results for input(s): CHOL, HDL, LDLCALC, TRIG, CHOLHDL, LDLDIRECT in the last 72 hours. Thyroid Function Tests: Recent Labs    12/30/17 0818  TSH 1.313   Anemia Panel: No results for input(s): VITAMINB12, FOLATE, FERRITIN, TIBC, IRON, RETICCTPCT in the last 72 hours. Urine analysis:    Component Value Date/Time   COLORURINE YELLOW 12/30/2017 0923   APPEARANCEUR CLEAR 12/30/2017 0923   LABSPEC 1.010 12/30/2017 0923   PHURINE 5.0 12/30/2017 0923   GLUCOSEU NEGATIVE 12/30/2017 0923   HGBUR SMALL (A) 12/30/2017 0923   BILIRUBINUR NEGATIVE 12/30/2017 0923   KETONESUR NEGATIVE 12/30/2017 0923   PROTEINUR 30 (A) 12/30/2017 0923   UROBILINOGEN 0.2 03/24/2013 1255   NITRITE NEGATIVE 12/30/2017 0923   LEUKOCYTESUR NEGATIVE 12/30/2017 0923   Sepsis Labs: @LABRCNTIP (procalcitonin:4,lacticidven:4)   )No results found for this or any previous visit (from the past 240 hour(s)).    Radiology Studies: Dg Elbow 2 Views Left Result Date: 12/30/2017 1. No fracture or elbow joint abnormality.  No joint effusion. 2. Dorsal soft tissue swelling.  Ct Angio Chest Pe W Or Wo Contrast Result Date: 12/30/2017 1. Negative for acute PE. 2. Bibasilar  airspace opacities right greater than left. 3. Scattered nodular opacities throughout the right lung; given the unilateral distribution favor infectious/inflammatory over neoplastic. Non-contrast chest CT at 3-6 months is recommended to confirm appropriate resolution. This recommendation follows the consensus statement: Guidelines for Management of Incidental Pulmonary Nodules Detected on CT Images: From the Fleischner Society 2017; Radiology 2017; 284:228-243. 4. Borderline left hilar and mediastinal adenopathy, possibly reactive but nonspecific. Follow-up as above.   Dg Chest Portable 1 View Result Date: 12/30/2017 Right basilar airspace opacity may reflect pneumonia or asymmetric pulmonary edema. Mild vascular congestion noted. Electronically Signed   By: Garald Balding M.D.   On: 12/30/2017 04:46     Scheduled Meds: . amLODipine  10 mg Oral Daily  . aspirin  324 mg Oral Once  . atorvastatin  40  mg Oral QPM  . carvedilol  25 mg Oral BID WC  . enoxaparin (LOVENOX) injection  40 mg Subcutaneous Q24H  . folic acid  1 mg Oral Daily  . furosemide  40 mg Intravenous Daily  . gabapentin  300 mg Oral TID  . insulin aspart  0-15 Units Subcutaneous TID WC  . lisinopril  40 mg Oral Daily  . multivitamin with minerals  1 tablet Oral Daily  . pantoprazole  40 mg Oral Daily  . potassium chloride  40 mEq Oral Once  . sertraline  150 mg Oral Daily  . thiamine  100 mg Oral Daily   Continuous Infusions: . azithromycin Stopped (12/30/17 1218)  . cefTRIAXone (ROCEPHIN)  IV Stopped (12/30/17 1103)     LOS: 0 days    Time spent: 25 minutes Greater than 50% of the time spent on counseling and coordinating the care.   Leisa Lenz, MD [facility] Pager [phone]  If 7PM-7AM, please contact night-coverage [email or web address] Password [facility] 12/31/2017, 9:18 AM

## 2017-12-31 NOTE — Progress Notes (Signed)
  Echocardiogram 2D Echocardiogram Limited with definitiy has been performed.  Darlina Sicilian M 12/31/2017, 8:18 AM

## 2017-12-31 NOTE — Plan of Care (Signed)
  Problem: Health Behavior/Discharge Planning: Goal: Ability to manage health-related needs will improve Outcome: Progressing   Problem: Activity: Goal: Risk for activity intolerance will decrease Outcome: Progressing   Problem: Coping: Goal: Level of anxiety will decrease Outcome: Progressing   Problem: Pain Managment: Goal: General experience of comfort will improve Outcome: Progressing   Problem: Safety: Goal: Ability to remain free from injury will improve Outcome: Progressing   

## 2017-12-31 NOTE — Progress Notes (Signed)
RT Note: Patient asked for another PRN nebulizer treatment. Patient received a PRN nebulizer treatment at 0630 this am and it is ordered Q6 PRN. Patient is up in the chair preparing to make a phone call. He is speaking in full sentences and is in no apparent acute respiratory distress. Rt notified the patient that it is to soon to receive another treatment and he said he understands and does not need it. Rt will continue to assess and assist as needed.

## 2017-12-31 NOTE — Progress Notes (Signed)
Patient did well throughout the night, but this am awakened and started to feel anxious and SHOB. RN helped with nebulizer and ativan (CIWA Q6).

## 2017-12-31 NOTE — Progress Notes (Signed)
PT Cancellation Note  Patient Details Name: Francis Cardenas MRN: [id number] DOB: [birth year]   Cancelled Treatment:    Reason Eval/Treat Not Completed: (P) Medical issues which prohibited therapy Pt with up trending Troponin levels not yet addressed by cardiologist. PT to hold evaluation until troponin level is down trending or cleared by cardiologist.  Thank you.  [name] B. Migdalia Dk PT, DPT Acute Rehabilitation  [phone] Pager [phone]  [place] 12/31/2017, 4:37 PM

## 2017-12-31 NOTE — Progress Notes (Signed)
RT Note: Rt was called by patient's RN because patient wanted to go on his CPAP to take a nap but he needed a larger mask. A larger sized mask was brought to patient and he then placed himself on his CPAP. Pt tolerating it well. Rt will continue to monitor.

## 2018-01-01 LAB — CBC
HEMATOCRIT: 39.4 % (ref 39.0–52.0)
HEMOGLOBIN: 13 g/dL (ref 13.0–17.0)
MCH: 31 pg (ref 26.0–34.0)
MCHC: 33 g/dL (ref 30.0–36.0)
MCV: 94 fL (ref 78.0–100.0)
Platelets: 155 10*3/uL (ref 150–400)
RBC: 4.19 MIL/uL — AB (ref 4.22–5.81)
RDW: 14 % (ref 11.5–15.5)
WBC: 8.4 10*3/uL (ref 4.0–10.5)

## 2018-01-01 LAB — BASIC METABOLIC PANEL
Anion gap: 11 (ref 5–15)
BUN: 17 mg/dL (ref 8–23)
CALCIUM: 10.1 mg/dL (ref 8.9–10.3)
CO2: 26 mmol/L (ref 22–32)
Chloride: 103 mmol/L (ref 98–111)
Creatinine, Ser: 0.82 mg/dL (ref 0.61–1.24)
GLUCOSE: 132 mg/dL — AB (ref 70–99)
POTASSIUM: 3.1 mmol/L — AB (ref 3.5–5.1)
Sodium: 140 mmol/L (ref 135–145)

## 2018-01-01 LAB — GLUCOSE, CAPILLARY
GLUCOSE-CAPILLARY: 116 mg/dL — AB (ref 70–99)
GLUCOSE-CAPILLARY: 104 mg/dL — AB (ref 70–99)
GLUCOSE-CAPILLARY: 107 mg/dL — AB (ref 70–99)
GLUCOSE-CAPILLARY: 121 mg/dL — AB (ref 70–99)

## 2018-01-01 LAB — TROPONIN I: TROPONIN I: 0.45 ng/mL — AB (ref <0.03)

## 2018-01-01 MED ORDER — ZOLPIDEM TARTRATE 5 MG PO TABS
5.0000 mg | ORAL_TABLET | Freq: Every evening | ORAL | Status: DC | PRN
  Administered 2018-01-01 – 2018-01-04 (×4): 5 mg via ORAL
  Filled 2018-01-01 (×4): qty 1

## 2018-01-01 NOTE — Evaluation (Signed)
Physical Therapy Evaluation/Discharge  Patient Details Name: Francis Cardenas MRN: [id number] DOB: 07/06/1950 Today's Date: 01/01/2018   History of Present Illness  67 yo admitted with SOB respiratory failure with PNA and CHF. PMhx: CHF, HTN, DM, ETOH abuse  Clinical Impression  Pt pleasant sitting at EOB on 4L [place] upon arrival. Pt independent with transfer and ambulation with supervision for  safety and O2 management. During gait patient reduced to 1L [place] with SpO2 of 89-91, at 2L pt maintained SpO2 90-96 with quick recovery at 94 or above with standing rest break and verbal cues for pursed lip breathing. Pt reports this activity level is at baseline for what doing at home. There is no further need for physical therapy here at the hospital.  Patient has had four falls reported in last six months and would benefit from further balance training with outpatient physical therapy. Plan discussed and patient agrees. Pt encouraged to ambulate in hallway with nurse supervision.        Follow Up Recommendations Outpatient PT    Equipment Recommendations  None recommended by PT    Recommendations for Other Services       Precautions / Restrictions Precautions Precautions: Fall Precaution Comments: pt reports at least 4 falls in the last 6 months Restrictions Weight Bearing Restrictions: No      Mobility  Bed Mobility               General bed mobility comments: pt EOB on arrival  Transfers Overall transfer level: Independent               General transfer comment: pt able to sit and stand from bed without assist  Ambulation/Gait Ambulation/Gait assistance: Independent Gait Distance (Feet): 350 Feet Assistive device: None Gait Pattern/deviations: Step-through pattern   Gait velocity interpretation: >4.37 ft/sec, indicative of normal walking speed General Gait Details: cues for pursed lip breathing and 2 standing pauses with gait to maintain oxygen saturation >92% on  2L  Stairs Stairs: Yes Stairs assistance: Modified independent (Device/Increase time) Stair Management: Alternating pattern;Two rails Number of Stairs: 2    Wheelchair Mobility    Modified Rankin (Stroke Patients Only)       Balance Overall balance assessment: Needs assistance   Sitting balance-Leahy Scale: Normal     Standing balance support: No upper extremity supported Standing balance-Leahy Scale: Fair       Tandem Stance - Right Leg: (unable to achieve position due to balance deficit )   Rhomberg - Eyes Opened: 30 Rh[place]berg - Eyes Closed: 30(mild anterior to posterior sway)                 Pertinent Vitals/Pain Pain Assessment: No/denies pain    Home Living Family/patient expects to be discharged to:: Private residence Living Arrangements: Spouse/significant other Available Help at Discharge: Family;Available 24 hours/day Type of Home: House Home Access: Stairs to enter   [employer] of Steps: 2   Home Equipment: Walker - 4 wheels      Prior Function Level of Independence: Independent               Hand Dominance        Extremity/Trunk Assessment   Upper Extremity Assessment Upper Extremity Assessment: Overall WFL for tasks assessed    Lower Extremity Assessment Lower Extremity Assessment: Overall WFL for tasks assessed    Cervical / Trunk Assessment Cervical / Trunk Assessment: Normal  Communication   Communication: No difficulties  Cognition Arousal/Alertness: Awake/alert Behavior During Therapy: [facility]  for tasks assessed/performed Overall Cognitive Status: Within Functional Limits for tasks assessed                                        General Comments General comments (skin integrity, edema, etc.): some mild sway noted with gait    Exercises     Assessment/Plan    PT Assessment All further PT needs can be met in the next venue of care  PT Problem List Decreased balance       PT  Treatment Interventions      PT Goals (Current goals can be found in the Care Plan section)  Acute Rehab PT Goals PT Goal Formulation: All assessment and education complete, DC therapy    Frequency     Barriers to discharge        Co-evaluation               AM-PAC PT "6 Clicks" Daily Activity  Outcome Measure Difficulty turning over in bed (including adjusting bedclothes, sheets and blankets)?: A Little Difficulty moving from lying on back to sitting on the side of the bed? : A Little Difficulty sitting down on and standing up from a chair with arms (e.g., wheelchair, bedside commode, etc,.)?: None Help needed moving to and from a bed to chair (including a wheelchair)?: None Help needed walking in hospital room?: None Help needed climbing 3-5 steps with a railing? : None 6 Click Score: 22    End of Session Equipment Utilized During Treatment: Gait belt;Oxygen Activity Tolerance: Patient tolerated treatment well Patient left: in chair;with call bell/phone within reach;with chair alarm set Nurse Communication: Mobility status PT Visit Diagnosis: Unsteadiness on feet (R26.81)    Time: 4712-5271 PT Time Calculation (min) (ACUTE ONLY): 36 min   Charges:   PT Evaluation $PT Eval Moderate Complexity: 1 Mod PT Treatments $Gait Training: 8-22 mins        Samuella Bruin, [place]  Acute Rehab [phone]   Samuella Bruin 01/01/2018, 12:45 PM

## 2018-01-01 NOTE — Care Management Note (Signed)
Case Management Note  Patient Details  Name: Francis Cardenas MRN: [id number] Date of Birth: 01/07/1951  Subjective/Objective: Acute Resp Failure with Hypoxia                  Action/Plan: Patient lives at home with spouse; goes to the [facility] for medical care and use their pharmacy / Mail order; He continues to drive his vehicle; patient states that he drinks 16-18 beers every day; CM asked pt if he wanted to talk with the SW about resources to assist him to stop drinking; he is agreeable and request to talk to someone tomorrow. DME - CPAP; CM will continue to follow for progression of care.   Expected Discharge Date:    possibly 01/04/2018              Expected Discharge Plan:  Home/Self Care  In-House Referral:    SW Discharge planning Services  CM Consult  Status of Service:  In process, will continue to follow  Sherrilyn Rist [phone] 01/01/2018, 12:08 PM

## 2018-01-01 NOTE — Progress Notes (Signed)
Patient declines bed and chair alarm. Education given and patient verbalizes education.

## 2018-01-01 NOTE — Progress Notes (Addendum)
PROGRESS NOTE    Francis Cardenas  CNO:[id number] DOB: 08-09-1950 DOA: 12/30/2017  PCP: System, Provider Not In   Brief Narrative:  66 y.o. male with medical history significant for diabetes, HTN, chronic systolic CHF with EF of 29-47%, alcohol abuse with last admission to behavioral hospital in 2014, whose care is followed at the [facility] in [place] who presented to ED with sudden shortness of breath over past 24 hours PTA. Nebulizer treatments did not help relieve shortness of breath. Of note, the patient drinks anywhere from 7 to 14 cans of beer a day, especially over the last 7 years, after he retired.   Chest x-ray showed right basilar airspace opacity, he was started on azithro and rocephin, BiPAP and lasix IV.   Assessment & Plan:   Principal Problem:   Acute respiratory failure with hypoxia (HCC) / Acute on chronic systolic CHF - CT angio chest negative for PE - ECHO showed EF of 40-45% - Continue lasix 40 mg IV daily - Continue coreg 25 mg  - Continue BiPAP - Continue daily weight and strict intake and output - Weight: 128.4 kg --> 128.1 kg --> 128.2 kg    Active Problems:   Elevated troponin level - Likely demand ischemia from hypoxia - Troponin level trending down - Troponin 0.9 --> 0.4    Right lower lobe pneumonia - CXR showed right basilar opacity - Started on azithro and Rocephin    Left elbow erythema, cellulitis - X ray without acute findings - Rocephin to cover for this, likely bacteria is strep - Uric acid WNL    Alcohol abuse - Counseled on cessation  - Continue folic acid, multivitamin, thiamine  - Continue Ativan PRN - No reports of withdrawals     Hypokalemia - Due to lasix - Supplemented     Depression - Continue Zoloft     Essential hypertension - Continue Norvasc 10 mg daily, lisinopril 40 mg daily     Dyslipidemia - Continue Lipitor 40 mg daily     Type 2 diabetes mellitus with diabetic neuropathy (HCC) - Continue SSI - Continue  gabapentin for neuropathy  - CBG's: 138, 116, 104     DVT prophylaxis: Lovenox subQ Code Status: full code  Family Communication: no family at the bedside this am Disposition Plan: pt acute, not yet stable for discharge    Consultants:   None  Procedures:   CPAP  Antimicrobials:   Azithromycin 8/10 -->  Rocephin 8/10 -->  Doxycyline 8/122 -->   Subjective: No overnight events.  Objective: Vitals:   01/01/18 0404 01/01/18 0939 01/01/18 1140 01/01/18 1157  BP: 132/71 (!) 123/53 (!) 104/55   Pulse: 63 67 62   Resp:   18   Temp:   98.1 F (36.7 C)   TempSrc:   Oral   SpO2:   100% 97%  Weight: 128.2 kg     Height:        Intake/Output Summary (Last 24 hours) at 01/01/2018 1342 Last data filed at 01/01/2018 1121 Gross per 24 hour  Intake 1040 ml  Output 1250 ml  Net -210 ml   Filed Weights   12/30/17 1247 12/31/17 0614 01/01/18 0404  Weight: 128.4 kg 128.1 kg 128.2 kg    Examination:  Physical Exam  Constitutional: Appears well-developed and well-nourished. No distress.  HENT: Normocephalic. External right and left ear normal. Oropharynx is clear and moist.  Eyes: Conjunctivae and EOM are normal. PERRLA, no scleral icterus.  Neck: Normal ROM. Neck supple.  No JVD. No tracheal deviation. No thyromegaly.  CVS: Rate controlled , S1/S2 + Pulmonary: diminished but no rhonchi Abdominal: Soft. BS +,  no distension, tenderness, rebound or guarding.  Musculoskeletal: Normal range of motion. No edema and no tenderness.  Lymphadenopathy: No lymphadenopathy noted, cervical, inguinal. Neuro: Alert. Normal reflexes, muscle tone coordination. No cranial nerve deficit. Skin: Skin is warm and dry. Left elbow redness better this am Psychiatric: Normal mood and affect. Behavior, judgment, thought content normal.     Data Reviewed: I have personally reviewed following labs and imaging studies  CBC: Recent Labs  Lab 12/30/17 0448 12/31/17 0613 01/01/18 0426  WBC  12.3* 11.0* 8.4  NEUTROABS 11.1*  --   --   HGB 14.4 13.4 13.0  HCT 43.4 41.4 39.4  MCV 92.7 94.3 94.0  PLT 166 175 283   Basic Metabolic Panel: Recent Labs  Lab 12/30/17 0448 12/31/17 0613 01/01/18 0426  NA 142 141 140  K 3.6 3.1* 3.1*  CL 104 103 103  CO2 21* 26 26  GLUCOSE 172* 139* 132*  BUN 12 12 17   CREATININE 0.86 0.82 0.82  CALCIUM 10.1 10.3 10.1   GFR: Estimated Creatinine Clearance: 117.7 mL/min (by C-G formula based on SCr of 0.82 mg/dL). Liver Function Tests: Recent Labs  Lab 12/30/17 0448  AST 53*  ALT 41  ALKPHOS 90  BILITOT 1.4*  PROT 8.0  ALBUMIN 4.1   No results for input(s): LIPASE, AMYLASE in the last 168 hours. No results for input(s): AMMONIA in the last 168 hours. Coagulation Profile: No results for input(s): INR, PROTIME in the last 168 hours. Cardiac Enzymes: Recent Labs  Lab 12/30/17 1343 12/30/17 1722 12/30/17 2024 12/30/17 2257 01/01/18 0910  TROPONINI 0.76* 0.86* 0.88* 0.99* 0.45*   BNP (last 3 results) No results for input(s): PROBNP in the last 8760 hours. HbA1C: Recent Labs    12/30/17 0818  HGBA1C 5.4   CBG: Recent Labs  Lab 12/31/17 1107 12/31/17 1637 12/31/17 2104 01/01/18 0736 01/01/18 1139  GLUCAP 154* 146* 138* 116* 104*   Lipid Profile: No results for input(s): CHOL, HDL, LDLCALC, TRIG, CHOLHDL, LDLDIRECT in the last 72 hours. Thyroid Function Tests: Recent Labs    12/30/17 0818  TSH 1.313   Anemia Panel: No results for input(s): VITAMINB12, FOLATE, FERRITIN, TIBC, IRON, RETICCTPCT in the last 72 hours. Urine analysis:    Component Value Date/Time   COLORURINE YELLOW 12/30/2017 [place] 12/30/2017 0923   LABSPEC 1.010 12/30/2017 0923   PHURINE 5.0 12/30/2017 0923   GLUCOSEU NEGATIVE 12/30/2017 0923   HGBUR SMALL (A) 12/30/2017 0923   BILIRUBINUR NEGATIVE 12/30/2017 0923   KETONESUR NEGATIVE 12/30/2017 0923   PROTEINUR 30 (A) 12/30/2017 0923   UROBILINOGEN 0.2 03/24/2013 1255    NITRITE NEGATIVE 12/30/2017 0923   LEUKOCYTESUR NEGATIVE 12/30/2017 0923   Sepsis Labs: @LABRCNTIP (procalcitonin:4,lacticidven:4)   ) Recent Results (from the past 240 hour(s))  Culture, blood (Routine X 2) w Reflex to ID Panel     Status: None (Preliminary result)   Collection Time: 12/30/17  9:08 AM  Result Value Ref Range Status   Specimen Description BLOOD RIGHT ANTECUBITAL  Final   Special Requests   Final    BOTTLES DRAWN AEROBIC AND ANAEROBIC Blood Culture adequate volume   Culture   Final    NO GROWTH 2 DAYS Performed at [facility], [place] [address] [place], [place] [address]    Report Status PENDING  Incomplete  Culture, blood (Routine  X 2) w Reflex to ID Panel     Status: None (Preliminary result)   Collection Time: 12/30/17  9:20 AM  Result Value Ref Range Status   Specimen Description BLOOD LEFT ANTECUBITAL  Final   Special Requests   Final    BOTTLES DRAWN AEROBIC AND ANAEROBIC Blood Culture adequate volume   Culture   Final    NO GROWTH 2 DAYS Performed at [facility], [address], [place], [place] [address]    Report Status PENDING  Incomplete      Radiology Studies: Dg Elbow 2 Views Left Result Date: 12/30/2017 1. No fracture or elbow joint abnormality.  No joint effusion. 2. Dorsal soft tissue swelling.  Ct Angio Chest Pe W Or Wo Contrast Result Date: 12/30/2017 1. Negative for acute PE. 2. Bibasilar airspace opacities right greater than left. 3. Scattered nodular opacities throughout the right lung; given the unilateral distribution favor infectious/inflammatory over neoplastic. Non-contrast chest CT at 3-6 months is recommended to confirm appropriate resolution. This recommendation follows the consensus statement: Guidelines for Management of Incidental Pulmonary Nodules Detected on CT Images: From the Fleischner Society 2017; Radiology 2017; 284:228-243. 4. Borderline left hilar and mediastinal adenopathy, possibly reactive but  nonspecific. Follow-up as above.   Dg Chest Portable 1 View Result Date: 12/30/2017 Right basilar airspace opacity may reflect pneumonia or asymmetric pulmonary edema. Mild vascular congestion noted. Electronically Signed   By: Garald Balding M.D.   On: 12/30/2017 04:46     Scheduled Meds: . amLODipine  10 mg Oral Daily  . aspirin  324 mg Oral Once  . atorvastatin  40 mg Oral QPM  . azithromycin  500 mg Oral Daily  . carvedilol  25 mg Oral BID WC  . enoxaparin (LOVENOX) injection  40 mg Subcutaneous Q24H  . folic acid  1 mg Oral Daily  . furosemide  40 mg Intravenous Daily  . gabapentin  300 mg Oral TID  . insulin aspart  0-15 Units Subcutaneous TID WC  . lisinopril  40 mg Oral Daily  . multivitamin with minerals  1 tablet Oral Daily  . pantoprazole  40 mg Oral Daily  . sertraline  150 mg Oral Daily  . thiamine  100 mg Oral Daily   Continuous Infusions: . cefTRIAXone (ROCEPHIN)  IV 1 g (01/01/18 0950)     LOS: 1 day    Time spent: 25 minutes Greater than 50% of the time spent on counseling and coordinating the care.   Leisa Lenz, MD [facility] Pager [phone]  If 7PM-7AM, please contact night-coverage [email or web address] Password [email or web address] 01/01/2018, 1:42 PM

## 2018-01-01 NOTE — Progress Notes (Signed)
PT Cancellation Note  Patient Details Name: Francis Cardenas MRN: [id number] DOB: April 19, 1951   Cancelled Treatment:    Reason Eval/Treat Not Completed: Medical issues which prohibited therapy(pt with elevated troponin and await redraw with decreasing values to proceed)   Kealie Barrie B Dayle Mcnerney 01/01/2018, 9:09 AM  Elwyn Reach, [place]

## 2018-01-02 LAB — GLUCOSE, CAPILLARY
GLUCOSE-CAPILLARY: 119 mg/dL — AB (ref 70–99)
GLUCOSE-CAPILLARY: 120 mg/dL — AB (ref 70–99)
GLUCOSE-CAPILLARY: 119 mg/dL — AB (ref 70–99)
Glucose-Capillary: 118 mg/dL — ABNORMAL HIGH (ref 70–99)

## 2018-01-02 LAB — CBC
HCT: 36.5 % — ABNORMAL LOW (ref 39.0–52.0)
Hemoglobin: 12.1 g/dL — ABNORMAL LOW (ref 13.0–17.0)
MCH: 31.2 pg (ref 26.0–34.0)
MCHC: 33.2 g/dL (ref 30.0–36.0)
MCV: 94.1 fL (ref 78.0–100.0)
PLATELETS: 176 10*3/uL (ref 150–400)
RBC: 3.88 MIL/uL — AB (ref 4.22–5.81)
RDW: 14 % (ref 11.5–15.5)
WBC: 6.6 10*3/uL (ref 4.0–10.5)

## 2018-01-02 LAB — BASIC METABOLIC PANEL
Anion gap: 14 (ref 5–15)
BUN: 18 mg/dL (ref 8–23)
CHLORIDE: 102 mmol/L (ref 98–111)
CO2: 24 mmol/L (ref 22–32)
Calcium: 10 mg/dL (ref 8.9–10.3)
Creatinine, Ser: 0.76 mg/dL (ref 0.61–1.24)
GFR calc Af Amer: >60 mL/min (ref >60)
Glucose, Bld: 121 mg/dL — ABNORMAL HIGH (ref 70–99)
POTASSIUM: 3.1 mmol/L — AB (ref 3.5–5.1)
SODIUM: 140 mmol/L (ref 135–145)

## 2018-01-02 MED ORDER — DOXYCYCLINE HYCLATE 100 MG PO TABS
100.0000 mg | ORAL_TABLET | Freq: Two times a day (BID) | ORAL | Status: DC
  Administered 2018-01-02 – 2018-01-05 (×7): 100 mg via ORAL
  Filled 2018-01-02 (×7): qty 1

## 2018-01-02 MED ORDER — FUROSEMIDE 10 MG/ML IJ SOLN
40.0000 mg | Freq: Two times a day (BID) | INTRAMUSCULAR | Status: DC
  Administered 2018-01-02 – 2018-01-04 (×4): 40 mg via INTRAVENOUS
  Filled 2018-01-02 (×4): qty 4

## 2018-01-02 MED ORDER — POTASSIUM CHLORIDE CRYS ER 20 MEQ PO TBCR
40.0000 meq | EXTENDED_RELEASE_TABLET | Freq: Once | ORAL | Status: AC
  Administered 2018-01-02: 40 meq via ORAL
  Filled 2018-01-02: qty 2

## 2018-01-02 MED ORDER — SENNOSIDES-DOCUSATE SODIUM 8.6-50 MG PO TABS
1.0000 | ORAL_TABLET | Freq: Every day | ORAL | Status: DC
  Administered 2018-01-02 – 2018-01-05 (×4): 1 via ORAL
  Filled 2018-01-02 (×4): qty 1

## 2018-01-02 NOTE — Progress Notes (Signed)
Pt continues to refuse bed alarm. Will continue to monitor. 

## 2018-01-02 NOTE — Progress Notes (Addendum)
PROGRESS NOTE    DEAIRE Cardenas  WLS:[id number] DOB: 1951-02-14 DOA: 12/30/2017  PCP: System, Provider Not In   Brief Narrative:  67 y.o. male with medical history significant for diabetes, HTN, chronic systolic CHF with EF of 81-15%, alcohol abuse with last admission to behavioral hospital in 2014, whose care is followed at the [facility] in [place] who presented to ED with sudden shortness of breath over past 24 hours PTA. Nebulizer treatments did not help relieve shortness of breath. Of note, the patient drinks anywhere from 7 to 14 cans of beer a day, especially over the last 7 years, after he retired.   Chest x-ray showed right basilar airspace opacity, he was started on azithro and rocephin, BiPAP and lasix IV.   Assessment & Plan:   Principal Problem:   Acute respiratory failure with hypoxia (HCC) / Acute on chronic systolic CHF - CT angio chest negative for PE - ECHO showed EF of 40-45% - Continue coreg 25 mg  - Continue daily weight and strict intake and output - Weight up this am, will go up on lasix 40 mg IV BID form current frequency of 40 mg IV daily  - CPAP at bedtime  Active Problems:   Elevated troponin level - Likely demand ischemia from hypoxia - Troponin level trending down - Troponin 0.9 --> 0.4    Right lower lobe pneumonia - CXR showed right basilar opacity - Continue azithro and Rocephin    Left elbow erythema, cellulitis - X ray without acute findings - Little worse today, will add back doxycycline 100 mg Q 12 hours     Alcohol abuse - Counseled on cessation  - Continue folic acid, multivitamin, thiamine  - Continue Ativan PRN - No reports of withdrawals     Hypokalemia - Due to lasix - Supplemented     Depression - Continue Zoloft     Essential hypertension - Continue Norvasc 10 mg daily, lisinopril 40 mg daily     Dyslipidemia - Continue Lipitor 40 mg daily     Type 2 diabetes mellitus with diabetic neuropathy (HCC) - Continue SSI -  Continue gabapentin for neuropathy    DVT prophylaxis: Lovenox subQ Code Status: full code  Family Communication: no family at the bedside this am Disposition Plan: pt acute, not stable for discharge    Consultants:   None  Procedures:   CPAP  Antimicrobials:   Azithromycin 8/10 -->  Rocephin 8/10 -->  Doxycyline 8/12 --> 8/12; resumed 8/13 -->   Subjective: Redness in elbow little worse this am compared with yesterday.  Objective: Vitals:   01/01/18 2156 01/02/18 0026 01/02/18 0544 01/02/18 0843  BP: 95/67 116/81 (!) 94/40 100/60  Pulse: 67 60 63   Resp: 20 18 19    Temp: (!) 97.4 F (36.3 C) 97.7 F (36.5 C) (!) 97.4 F (36.3 C)   TempSrc: Oral Oral Oral   SpO2: 97% 95% 98%   Weight:   129.9 kg   Height:        Intake/Output Summary (Last 24 hours) at 01/02/2018 0950 Last data filed at 01/02/2018 0919 Gross per 24 hour  Intake 720 ml  Output 725 ml  Net -5 ml   Filed Weights   12/31/17 0614 01/01/18 0404 01/02/18 0544  Weight: 128.1 kg 128.2 kg 129.9 kg    Examination:  Physical Exam  Constitutional: Appears well-developed and well-nourished. No distress.  HENT: Normocephalic. External right and left ear normal. Oropharynx is clear and moist.  Eyes: Conjunctivae  and EOM are normal. PERRLA, no scleral icterus.  Neck: Normal ROM. Neck supple. No JVD. No tracheal deviation. No thyromegaly.  CVS: Rate controlled, S1/S2 + Pulmonary: Diminished breath sounds, no wheezing  Abdominal: Soft. BS +,  no distension, tenderness, rebound or guarding.  Musculoskeletal: Normal range of motion. No edema and no tenderness.  Lymphadenopathy: No lymphadenopathy noted, cervical, inguinal. Neuro: Alert. Normal reflexes, muscle tone coordination. No cranial nerve deficit. Skin: Skin is warm and dry. Elbow erythema little worse this am  Psychiatric: Normal mood and affect. Behavior, judgment, thought content normal.    Data Reviewed: I have personally reviewed  following labs and imaging studies  CBC: Recent Labs  Lab 12/30/17 0448 12/31/17 0613 01/01/18 0426  WBC 12.3* 11.0* 8.4  NEUTROABS 11.1*  --   --   HGB 14.4 13.4 13.0  HCT 43.4 41.4 39.4  MCV 92.7 94.3 94.0  PLT 166 175 035   Basic Metabolic Panel: Recent Labs  Lab 12/30/17 0448 12/31/17 0613 01/01/18 0426 01/02/18 0530  NA 142 141 140 140  K 3.6 3.1* 3.1* 3.1*  CL 104 103 103 102  CO2 21* 26 26 24   GLUCOSE 172* 139* 132* 121*  BUN 12 12 17 18   CREATININE 0.86 0.82 0.82 0.76  CALCIUM 10.1 10.3 10.1 10.0   GFR: Estimated Creatinine Clearance: 121.4 mL/min (by C-G formula based on SCr of 0.76 mg/dL). Liver Function Tests: Recent Labs  Lab 12/30/17 0448  AST 53*  ALT 41  ALKPHOS 90  BILITOT 1.4*  PROT 8.0  ALBUMIN 4.1   No results for input(s): LIPASE, AMYLASE in the last 168 hours. No results for input(s): AMMONIA in the last 168 hours. Coagulation Profile: No results for input(s): INR, PROTIME in the last 168 hours. Cardiac Enzymes: Recent Labs  Lab 12/30/17 1343 12/30/17 1722 12/30/17 2024 12/30/17 2257 01/01/18 0910  TROPONINI 0.76* 0.86* 0.88* 0.99* 0.45*   BNP (last 3 results) No results for input(s): PROBNP in the last 8760 hours. HbA1C: No results for input(s): HGBA1C in the last 72 hours. CBG: Recent Labs  Lab 01/01/18 0736 01/01/18 1139 01/01/18 1654 01/01/18 2247 01/02/18 0729  GLUCAP 116* 104* 107* 121* 119*   Lipid Profile: No results for input(s): CHOL, HDL, LDLCALC, TRIG, CHOLHDL, LDLDIRECT in the last 72 hours. Thyroid Function Tests: No results for input(s): TSH, T4TOTAL, FREET4, T3FREE, THYROIDAB in the last 72 hours. Anemia Panel: No results for input(s): VITAMINB12, FOLATE, FERRITIN, TIBC, IRON, RETICCTPCT in the last 72 hours. Urine analysis:    Component Value Date/Time   COLORURINE YELLOW 12/30/2017 [place] 12/30/2017 0923   LABSPEC 1.010 12/30/2017 0923   PHURINE 5.0 12/30/2017 0923   GLUCOSEU  NEGATIVE 12/30/2017 0923   HGBUR SMALL (A) 12/30/2017 0923   BILIRUBINUR NEGATIVE 12/30/2017 0923   KETONESUR NEGATIVE 12/30/2017 0923   PROTEINUR 30 (A) 12/30/2017 0923   UROBILINOGEN 0.2 03/24/2013 1255   NITRITE NEGATIVE 12/30/2017 0923   LEUKOCYTESUR NEGATIVE 12/30/2017 0923   Sepsis Labs: @LABRCNTIP (procalcitonin:4,lacticidven:4)   ) Recent Results (from the past 240 hour(s))  Culture, blood (Routine X 2) w Reflex to ID Panel     Status: None (Preliminary result)   Collection Time: 12/30/17  9:08 AM  Result Value Ref Range Status   Specimen Description BLOOD RIGHT ANTECUBITAL  Final   Special Requests   Final    BOTTLES DRAWN AEROBIC AND ANAEROBIC Blood Culture adequate volume   Culture   Final    NO GROWTH 2 DAYS  Performed at [facility], [place] [address] [place], [place] [address]    Report Status PENDING  Incomplete  Culture, blood (Routine X 2) w Reflex to ID Panel     Status: None (Preliminary result)   Collection Time: 12/30/17  9:20 AM  Result Value Ref Range Status   Specimen Description BLOOD LEFT ANTECUBITAL  Final   Special Requests   Final    BOTTLES DRAWN AEROBIC AND ANAEROBIC Blood Culture adequate volume   Culture   Final    NO GROWTH 2 DAYS Performed at [facility], [place] [address] [place], [place] [address]    Report Status PENDING  Incomplete      Radiology Studies: Dg Elbow 2 Views Left Result Date: 12/30/2017 1. No fracture or elbow joint abnormality.  No joint effusion. 2. Dorsal soft tissue swelling.  Ct Angio Chest Pe W Or Wo Contrast Result Date: 12/30/2017 1. Negative for acute PE. 2. Bibasilar airspace opacities right greater than left. 3. Scattered nodular opacities throughout the right lung; given the unilateral distribution favor infectious/inflammatory over neoplastic. Non-contrast chest CT at 3-6 months is recommended to confirm appropriate resolution. This recommendation follows the consensus statement: Guidelines for  Management of Incidental Pulmonary Nodules Detected on CT Images: From the Fleischner Society 2017; Radiology 2017; 284:228-243. 4. Borderline left hilar and mediastinal adenopathy, possibly reactive but nonspecific. Follow-up as above.   Dg Chest Portable 1 View Result Date: 12/30/2017 Right basilar airspace opacity may reflect pneumonia or asymmetric pulmonary edema. Mild vascular congestion noted. Electronically Signed   By: Garald Balding M.D.   On: 12/30/2017 04:46     Scheduled Meds: . amLODipine  10 mg Oral Daily  . aspirin  324 mg Oral Once  . atorvastatin  40 mg Oral QPM  . azithromycin  500 mg Oral Daily  . carvedilol  25 mg Oral BID WC  . doxycycline  100 mg Oral Q12H  . enoxaparin (LOVENOX) injection  40 mg Subcutaneous Q24H  . folic acid  1 mg Oral Daily  . furosemide  40 mg Intravenous Daily  . gabapentin  300 mg Oral TID  . insulin aspart  0-15 Units Subcutaneous TID WC  . lisinopril  40 mg Oral Daily  . multivitamin with minerals  1 tablet Oral Daily  . pantoprazole  40 mg Oral Daily  . potassium chloride  40 mEq Oral Once  . senna-docusate  1 tablet Oral Daily  . sertraline  150 mg Oral Daily  . thiamine  100 mg Oral Daily   Continuous Infusions: . cefTRIAXone (ROCEPHIN)  IV 1 g (01/02/18 0854)     LOS: 2 days    Time spent: 25 minutes Greater than 50% of the time spent on counseling and coordinating the care.   Leisa Lenz, MD [facility] Pager [phone]  If 7PM-7AM, please contact night-coverage [email or web address] Password [email or web address] 01/02/2018, 9:50 AM

## 2018-01-02 NOTE — Clinical Social Work Note (Signed)
Clinical Social Work Assessment  Patient Details  Name: Francis Cardenas MRN: [id number] Date of Birth: 19-Jul-1950  Date of referral:  01/02/18               Reason for consult:  Substance Use/ETOH Abuse                Permission sought to share information with:    Permission granted to share information::  No  Name::        Agency::     Relationship::     Contact Information:     Housing/Transportation Living arrangements for the past 2 months:  Single Family Home Source of Information:  Patient, Medical Team Patient Interpreter Needed:  None Criminal Activity/Legal Involvement Pertinent to Current Situation/Hospitalization:  No - Comment as needed Significant Relationships:  Spouse Lives with:  Spouse Do you feel safe going back to the place where you live?  Yes Need for family participation in patient care:  Yes (Comment)  Care giving concerns:  Substance abuse.   Social Worker assessment / plan:  CSW met with patient. No supports at bedside. CSW introduced role and inquired about interest in substance abuse resources. Patient reports drinking 16-18 beers per day. Patient agreeable so CSW provided list of local inpatient and outpatient options. Patient reports drinking Patient interested most in detox facility with plan to transition to inpatient placement. Patient has tried detox through the [facility] but does not like "how they run things." Patient has tried getting detox twice through them but he states they essentially want him to detox at home while taking Librium. He was detoxed years ago and then transitioned to [facility] for inpatient treatment but when he arrived they would not take him. Patient will review list and let CSW if I can assist with placement if he finds one he is interested in. The two detox facilities list require private pay or Medicaid. No further concerns. CSW signing off as social work intervention is no longer needed.  Employment status:  Retired Radiation protection practitioner:  Medicare, [place] Benefit PT Recommendations:  Outpatient Therapies Information / Referral to community resources:  Outpatient Substance Abuse Treatment Options, Residential Substance Abuse Treatment Options  Patient/Family's Response to care:  Patient agreeable to receiving resources. Patient's wife supportive and involved in patient's care. Patient appreciated social work intervention.  Patient/Family's Understanding of and Emotional Response to Diagnosis, Current Treatment, and Prognosis:  Patient has a good understanding of the reason for admission and social work consult. Patient appears happy with hospital care.  Emotional Assessment Appearance:  Appears stated age Attitude/Demeanor/Rapport:  Engaged, Gracious Affect (typically observed):  Accepting, Appropriate, Calm, Pleasant Orientation:  Oriented to Self, Oriented to Place, Oriented to  Time, Oriented to Situation Alcohol / Substance use:  Alcohol Use Psych involvement (Current and /or in the community):  No (Comment)  Discharge Needs  Concerns to be addressed:  No discharge needs identified Readmission within the last 30 days:  No Current discharge risk:  Substance Abuse Barriers to Discharge:  Continued Medical Work up   Candie Chroman, LCSW 01/02/2018, 12:45 PM

## 2018-01-02 NOTE — Progress Notes (Signed)
Pt continues to refuse bed alarm. Will inform pt's RN and continue to monitor.

## 2018-01-02 NOTE — Progress Notes (Signed)
Pt is stable and vitals stable, pulse oximeter is maintained at 95% @2l /via [place], paged MD to dc continuous pulse ox on request of patient and he seems stable, denies SOB, and chest pain, will continue to monitor the patient  [name], RN

## 2018-01-03 ENCOUNTER — Inpatient Hospital Stay (HOSPITAL_COMMUNITY): Payer: No Typology Code available for payment source

## 2018-01-03 LAB — BASIC METABOLIC PANEL
Anion gap: 10 (ref 5–15)
BUN: 19 mg/dL (ref 8–23)
CALCIUM: 9.9 mg/dL (ref 8.9–10.3)
CO2: 28 mmol/L (ref 22–32)
CREATININE: 0.77 mg/dL (ref 0.61–1.24)
Chloride: 103 mmol/L (ref 98–111)
GFR calc non Af Amer: >60 mL/min (ref >60)
Glucose, Bld: 117 mg/dL — ABNORMAL HIGH (ref 70–99)
Potassium: 3 mmol/L — ABNORMAL LOW (ref 3.5–5.1)
SODIUM: 141 mmol/L (ref 135–145)

## 2018-01-03 LAB — GLUCOSE, CAPILLARY
GLUCOSE-CAPILLARY: 114 mg/dL — AB (ref 70–99)
Glucose-Capillary: 102 mg/dL — ABNORMAL HIGH (ref 70–99)
Glucose-Capillary: 98 mg/dL (ref 70–99)
Glucose-Capillary: 115 mg/dL — ABNORMAL HIGH (ref 70–99)

## 2018-01-03 LAB — SYNOVIAL CELL COUNT + DIFF, W/ CRYSTALS
EOSINOPHILS-SYNOVIAL: 1 % (ref 0–1)
Lymphocytes-Synovial Fld: 3 % (ref 0–20)
MONOCYTE-MACROPHAGE-SYNOVIAL FLUID: 0 % — AB (ref 50–90)
NEUTROPHIL, SYNOVIAL: 96 % — AB (ref 0–25)

## 2018-01-03 MED ORDER — LIDOCAINE HCL (PF) 1 % IJ SOLN
INTRAMUSCULAR | Status: AC
  Filled 2018-01-03: qty 5

## 2018-01-03 MED ORDER — SODIUM CHLORIDE 0.9 % IJ SOLN
INTRAMUSCULAR | Status: AC
  Filled 2018-01-03: qty 20

## 2018-01-03 MED ORDER — LIDOCAINE HCL (PF) 1 % IJ SOLN
5.0000 mL | Freq: Once | INTRAMUSCULAR | Status: AC
  Administered 2018-01-03: 5 mL via INTRADERMAL

## 2018-01-03 MED ORDER — IOPAMIDOL (ISOVUE-M 200) INJECTION 41%
INTRAMUSCULAR | Status: AC
  Filled 2018-01-03: qty 10

## 2018-01-03 NOTE — Progress Notes (Addendum)
PROGRESS NOTE    Francis Cardenas  TFT:[id number] DOB: 07-May-1951 DOA: 12/30/2017  PCP: System, Provider Not In   Brief Narrative:  67 y.o. male with medical history significant for diabetes, HTN, chronic systolic CHF with EF of 70-62%, alcohol abuse with last admission to behavioral hospital in 2014, whose care is followed at the [facility] in [place] who presented to ED with sudden shortness of breath over past 24 hours PTA. Nebulizer treatments did not help relieve shortness of breath. Of note, the patient drinks anywhere from 7 to 14 cans of beer a day, especially over the last 7 years, after he retired.   Chest x-ray showed right basilar airspace opacity, he was started on azithro and rocephin, BiPAP and lasix IV.   Assessment & Plan:   Principal Problem:   Acute respiratory failure with hypoxia (HCC) / Acute on chronic systolic CHF - CT angio chest negative for PE - ECHO showed EF of 40-45% - Continue coreg 25 mg  - Weight 129/9 kg --> 129.4 kg - Continue lasix 40 mg IV 2 times daily  - Continue daily weight and strict intake and output - CPAP at bedtime  Active Problems:   Elevated troponin level - Likely demand ischemia from hypoxia - Troponin level trending down - Troponin 0.9 --> 0.4    Right lower lobe pneumonia - CXR showed right basilar opacity - Continue azithro and Rocephin    Left elbow erythema, cellulitis / Bursitis   - X ray without acute findings - Little worse today,addded doxycycline 100 mg Q 12 hours 8/13 - Order placed for arthrocentesis today, send fluid for analysis (cell count, culture, crystals)    Alcohol abuse - Counseled on cessation  - Continue folic acid, multivitamin, thiamine  - No reports of withdrawals     Hypokalemia - Due to lasix - Supplemented     Depression - Continue Zoloft     Essential hypertension - Continue Norvasc 10 mg daily, lisinopril 40 mg daily     Dyslipidemia - Continue Lipitor 40 mg daily     Type 2 diabetes  mellitus with diabetic neuropathy (HCC) - Continue SSI - CBG: 102, 93, 115 - Continue gabapentin for neuropathy    DVT prophylaxis:Lovenox subQ Code Status: full code  Family Communication: no family at the bedside this am Disposition Plan: plan for arthrocentesis today    Consultants:   IR - arthrocentesis 01/03/2018   Procedures:   CPAP  Antimicrobials:   Azithromycin 8/10 -->  Rocephin 8/10 -->  Doxycyline 8/12 --> 8/12; resumed 8/13 -->   Subjective: No overnight events.   Objective: Vitals:   01/03/18 1038 01/03/18 1214 01/03/18 1529 01/03/18 1959  BP: 120/60 124/63 115/84 (!) 124/59  Pulse:  (!) 56 68 63  Resp:  20  18  Temp:  (!) 97.4 F (36.3 C)  98.7 F (37.1 C)  TempSrc:  Oral  Oral  SpO2:  98%  97%  Weight:      Height:        Intake/Output Summary (Last 24 hours) at 01/03/2018 2100 Last data filed at 01/03/2018 2001 Gross per 24 hour  Intake 1700 ml  Output 775 ml  Net 925 ml   Filed Weights   01/01/18 0404 01/02/18 0544 01/03/18 0500  Weight: 128.2 kg 129.9 kg 129.4 kg    Physical Exam  Constitutional: Appears well-developed and well-nourished. No distress.  HENT: Normocephalic. External right and left ear normal. Oropharynx is clear and moist.  Eyes: Conjunctivae and  EOM are normal. PERRLA, no scleral icterus.  Neck: Normal ROM. Neck supple. No JVD. No tracheal deviation. No thyromegaly.  CVS: RRR, S1/S2 +, no murmurs, no gallops, no carotid bruit.  Pulmonary: Effort and breath sounds normal, no stridor, rhonchi, wheezes, rales.  Abdominal: Soft. BS +,  no distension, tenderness, rebound or guarding.  Musculoskeletal: Normal range of motion. No edema and no tenderness.  Lymphadenopathy: No lymphadenopathy noted, cervical, inguinal. Neuro: Alert. Normal reflexes, muscle tone coordination. No cranial nerve deficit. Skin: Skin is warm and dry. Left elbow redness, tender to palpation, erythema (+) Psychiatric: Normal mood and affect.  Behavior, judgment, thought content normal.     Data Reviewed: I have personally reviewed following labs and imaging studies  CBC: Recent Labs  Lab 12/30/17 0448 12/31/17 0613 01/01/18 0426 01/02/18 1013  WBC 12.3* 11.0* 8.4 6.6  NEUTROABS 11.1*  --   --   --   HGB 14.4 13.4 13.0 12.1*  HCT 43.4 41.4 39.4 36.5*  MCV 92.7 94.3 94.0 94.1  PLT 166 175 155 381   Basic Metabolic Panel: Recent Labs  Lab 12/30/17 0448 12/31/17 0613 01/01/18 0426 01/02/18 0530 01/03/18 0420  NA 142 141 140 140 141  K 3.6 3.1* 3.1* 3.1* 3.0*  CL 104 103 103 102 103  CO2 21* 26 26 24 28   GLUCOSE 172* 139* 132* 121* 117*  BUN 12 12 17 18 19   CREATININE 0.86 0.82 0.82 0.76 0.77  CALCIUM 10.1 10.3 10.1 10.0 9.9   GFR: Estimated Creatinine Clearance: 121.1 mL/min (by C-G formula based on SCr of 0.77 mg/dL). Liver Function Tests: Recent Labs  Lab 12/30/17 0448  AST 53*  ALT 41  ALKPHOS 90  BILITOT 1.4*  PROT 8.0  ALBUMIN 4.1   No results for input(s): LIPASE, AMYLASE in the last 168 hours. No results for input(s): AMMONIA in the last 168 hours. Coagulation Profile: No results for input(s): INR, PROTIME in the last 168 hours. Cardiac Enzymes: Recent Labs  Lab 12/30/17 1343 12/30/17 1722 12/30/17 2024 12/30/17 2257 01/01/18 0910  TROPONINI 0.76* 0.86* 0.88* 0.99* 0.45*   BNP (last 3 results) No results for input(s): PROBNP in the last 8760 hours. HbA1C: No results for input(s): HGBA1C in the last 72 hours. CBG: Recent Labs  Lab 01/02/18 2100 01/03/18 0751 01/03/18 1121 01/03/18 1628 01/03/18 2058  GLUCAP 118* 114* 102* 98 115*   Lipid Profile: No results for input(s): CHOL, HDL, LDLCALC, TRIG, CHOLHDL, LDLDIRECT in the last 72 hours. Thyroid Function Tests: No results for input(s): TSH, T4TOTAL, FREET4, T3FREE, THYROIDAB in the last 72 hours. Anemia Panel: No results for input(s): VITAMINB12, FOLATE, FERRITIN, TIBC, IRON, RETICCTPCT in the last 72 hours. Urine  analysis:    Component Value Date/Time   COLORURINE YELLOW 12/30/2017 [place] 12/30/2017 0923   LABSPEC 1.010 12/30/2017 0923   PHURINE 5.0 12/30/2017 0923   GLUCOSEU NEGATIVE 12/30/2017 0923   HGBUR SMALL (A) 12/30/2017 0923   BILIRUBINUR NEGATIVE 12/30/2017 0923   KETONESUR NEGATIVE 12/30/2017 0923   PROTEINUR 30 (A) 12/30/2017 0923   UROBILINOGEN 0.2 03/24/2013 1255   NITRITE NEGATIVE 12/30/2017 0923   LEUKOCYTESUR NEGATIVE 12/30/2017 0923   Sepsis Labs: @LABRCNTIP (procalcitonin:4,lacticidven:4)   ) Recent Results (from the past 240 hour(s))  Culture, blood (Routine X 2) w Reflex to ID Panel     Status: None (Preliminary result)   Collection Time: 12/30/17  9:08 AM  Result Value Ref Range Status   Specimen Description BLOOD RIGHT ANTECUBITAL  Final   Special Requests   Final    BOTTLES DRAWN AEROBIC AND ANAEROBIC Blood Culture adequate volume   Culture   Final    NO GROWTH 4 DAYS Performed at [facility], [address], [place], [place] [address]    Report Status PENDING  Incomplete  Culture, blood (Routine X 2) w Reflex to ID Panel     Status: None (Preliminary result)   Collection Time: 12/30/17  9:20 AM  Result Value Ref Range Status   Specimen Description BLOOD LEFT ANTECUBITAL  Final   Special Requests   Final    BOTTLES DRAWN AEROBIC AND ANAEROBIC Blood Culture adequate volume   Culture   Final    NO GROWTH 4 DAYS Performed at [facility], [employer] [address] [place], [place] [address]    Report Status PENDING  Incomplete  Body fluid culture     Status: None (Preliminary result)   Collection Time: 01/03/18  2:07 PM  Result Value Ref Range Status   Specimen Description SYNOVIAL LEFT ARM ELBOW  Final   Special Requests Normal  Final   Gram Stain   Final    NO WBC SEEN NO ORGANISMS SEEN Performed at [facility], [address] [place],  [address]    Culture PENDING  Incomplete   Report Status PENDING  Incomplete        Radiology Studies: Dg Elbow 2 Views Left Result Date: 12/30/2017 1. No fracture or elbow joint abnormality.  No joint effusion. 2. Dorsal soft tissue swelling.  Ct Angio Chest Pe W Or Wo Contrast Result Date: 12/30/2017 1. Negative for acute PE. 2. Bibasilar airspace opacities right greater than left. 3. Scattered nodular opacities throughout the right lung; given the unilateral distribution favor infectious/inflammatory over neoplastic. Non-contrast chest CT at 3-6 months is recommended to confirm appropriate resolution. This recommendation follows the consensus statement: Guidelines for Management of Incidental Pulmonary Nodules Detected on CT Images: From the Fleischner Society 2017; Radiology 2017; 284:228-243. 4. Borderline left hilar and mediastinal adenopathy, possibly reactive but nonspecific. Follow-up as above.   Dg Chest Portable 1 View Result Date: 12/30/2017 Right basilar airspace opacity may reflect pneumonia or asymmetric pulmonary edema. Mild vascular congestion noted. Electronically Signed   By: Garald Balding M.D.   On: 12/30/2017 04:46     Scheduled Meds: . amLODipine  10 mg Oral Daily  . aspirin  324 mg Oral Once  . atorvastatin  40 mg Oral QPM  . carvedilol  25 mg Oral BID WC  . doxycycline  100 mg Oral Q12H  . enoxaparin (LOVENOX) injection  40 mg Subcutaneous Q24H  . folic acid  1 mg Oral Daily  . furosemide  40 mg Intravenous BID  . gabapentin  300 mg Oral TID  . insulin aspart  0-15 Units Subcutaneous TID WC  . iopamidol      . lidocaine (PF)      . lisinopril  40 mg Oral Daily  . multivitamin with minerals  1 tablet Oral Daily  . pantoprazole  40 mg Oral Daily  . senna-docusate  1 tablet Oral Daily  . sertraline  150 mg Oral Daily  . sodium chloride      . thiamine  100 mg Oral Daily   Continuous Infusions: . cefTRIAXone (ROCEPHIN)  IV Stopped (01/03/18 1100)     LOS: 3 days    Time spent: 25 minutes Greater than 50% of the time spent on  counseling and  coordinating the care.   Leisa Lenz, MD [facility] Pager [phone]  If 7PM-7AM, please contact night-coverage [email or web address] Password [email or web address] 01/03/2018, 9:00 PM

## 2018-01-03 NOTE — Progress Notes (Signed)
Page to Dr Lorayne Marek. Radiology will not proceed with elbow eval/workup until orders are re-entered to include specific labs, per call from them this morning.

## 2018-01-03 NOTE — Progress Notes (Signed)
Patient resting comfortably during shift report. Denies complaints.  

## 2018-01-03 NOTE — Progress Notes (Signed)
Call back to radiology to review orders with them, as Dr Charlies Silvers states that the orders were entered the only way she could find to do it.  [employer] unavailable. Awaiting callback

## 2018-01-04 DIAGNOSIS — J9601 Acute respiratory failure with hypoxia: Secondary | ICD-10-CM

## 2018-01-04 DIAGNOSIS — C61 Malignant neoplasm of prostate: Secondary | ICD-10-CM

## 2018-01-04 DIAGNOSIS — F1010 Alcohol abuse, uncomplicated: Secondary | ICD-10-CM

## 2018-01-04 DIAGNOSIS — J449 Chronic obstructive pulmonary disease, unspecified: Secondary | ICD-10-CM

## 2018-01-04 DIAGNOSIS — I509 Heart failure, unspecified: Secondary | ICD-10-CM

## 2018-01-04 DIAGNOSIS — I10 Essential (primary) hypertension: Secondary | ICD-10-CM

## 2018-01-04 LAB — BASIC METABOLIC PANEL
Anion gap: 9 (ref 5–15)
BUN: 13 mg/dL (ref 8–23)
CHLORIDE: 103 mmol/L (ref 98–111)
CO2: 30 mmol/L (ref 22–32)
Calcium: 10.2 mg/dL (ref 8.9–10.3)
Creatinine, Ser: 0.77 mg/dL (ref 0.61–1.24)
GFR calc Af Amer: >60 mL/min (ref >60)
GFR calc non Af Amer: >60 mL/min (ref >60)
GLUCOSE: 143 mg/dL — AB (ref 70–99)
Potassium: 3.3 mmol/L — ABNORMAL LOW (ref 3.5–5.1)
Sodium: 142 mmol/L (ref 135–145)

## 2018-01-04 LAB — CULTURE, BLOOD (ROUTINE X 2)
CULTURE: NO GROWTH
Culture: NO GROWTH
Special Requests: ADEQUATE
Special Requests: ADEQUATE

## 2018-01-04 LAB — CBC
HEMATOCRIT: 36.7 % — AB (ref 39.0–52.0)
Hemoglobin: 12 g/dL — ABNORMAL LOW (ref 13.0–17.0)
MCH: 30.8 pg (ref 26.0–34.0)
MCHC: 32.7 g/dL (ref 30.0–36.0)
MCV: 94.1 fL (ref 78.0–100.0)
Platelets: 190 10*3/uL (ref 150–400)
RBC: 3.9 MIL/uL — ABNORMAL LOW (ref 4.22–5.81)
RDW: 13.9 % (ref 11.5–15.5)
WBC: 5.6 10*3/uL (ref 4.0–10.5)

## 2018-01-04 LAB — GLUCOSE, CAPILLARY
GLUCOSE-CAPILLARY: 110 mg/dL — AB (ref 70–99)
Glucose-Capillary: 107 mg/dL — ABNORMAL HIGH (ref 70–99)
Glucose-Capillary: 109 mg/dL — ABNORMAL HIGH (ref 70–99)
Glucose-Capillary: 118 mg/dL — ABNORMAL HIGH (ref 70–99)

## 2018-01-04 MED ORDER — FUROSEMIDE 10 MG/ML IJ SOLN
40.0000 mg | Freq: Once | INTRAMUSCULAR | Status: AC
  Administered 2018-01-04: 40 mg via INTRAVENOUS
  Filled 2018-01-04: qty 4

## 2018-01-04 MED ORDER — FUROSEMIDE 10 MG/ML IJ SOLN
80.0000 mg | Freq: Two times a day (BID) | INTRAMUSCULAR | Status: DC
  Administered 2018-01-04: 80 mg via INTRAVENOUS
  Filled 2018-01-04: qty 8

## 2018-01-04 MED ORDER — POTASSIUM CHLORIDE CRYS ER 20 MEQ PO TBCR
40.0000 meq | EXTENDED_RELEASE_TABLET | Freq: Every day | ORAL | Status: DC
  Administered 2018-01-04 – 2018-01-05 (×2): 40 meq via ORAL
  Filled 2018-01-04 (×2): qty 2

## 2018-01-04 MED ORDER — FUROSEMIDE 40 MG PO TABS
40.0000 mg | ORAL_TABLET | Freq: Every day | ORAL | Status: DC
  Administered 2018-01-05: 40 mg via ORAL
  Filled 2018-01-04: qty 1

## 2018-01-04 NOTE — Progress Notes (Signed)
RT NOTE:  Pt puts CPAP on when ready for bed.  

## 2018-01-04 NOTE — Progress Notes (Signed)
Referral made for Outpatient Physical Therapy through the [facility]; message left with [facility] SW with [facility]; Mindi Slicker RN,MHA,BSN [phone]  Please see full CM note documented on 01/01/2018. Mindi Slicker RN

## 2018-01-04 NOTE — Progress Notes (Signed)
Patient's fall risk score is 10 which requires alarm use and yellow arm band and socks.  Patient refused.

## 2018-01-04 NOTE — Progress Notes (Signed)
PROGRESS NOTE    Francis Cardenas  YFV:[id number] DOB: 1951/01/10 DOA: 12/30/2017 PCP: System, Provider Not In      Brief Narrative:  Francis Cardenas is a 67 y.o. M with sCHF E 40-45%, HTN, DM, and alcohol abuse who prsents with sudden onset SOB for 1 day.   Assessment & Plan:  Acute hypoxic respiratory fialure From CHF and pneumonia.  Acute on chronic systolic CHF Net positive yesterday.  Puffy today. -Furosemide 80 mg IV today, then resume home dose -K supplement -Strict I/Os, daily weights, telemetry  -Daily monitoring renal function   Right lower lobe pneumonia Completed 5 days azithromycin -Finish ceftriaxone day 6 of 6 today  Hypertension -Continue amlodipine -Continue aspirin, atorvastatin -Continue carvedilol, lisinopril  Diabetes -Continue gabapentin -Continue sliding scale  Elbow cellulitis Aspirated yesterday.  Low concern for septic joint.   -Continue doxycycline, probably finish at discharge -Follow culture  Other medications -Continue sertraline   Hypokalemia -Supplement potassium     DVT prophylaxis: Lovenox Code Status: FULL Family Communication: None present MDM and disposition Plan: The below labs and imaging reports were reviewed and summarized above.    The patient was admitted with CHf and pneumonia.  Slowly improving.  Likely home tomorrow with HHPT   Consultants:   None  Procedures:   None  Antimicrobials:   Ceftriaxone  Azithromycin  Doxycycline    Subjective: Feeling well.  Able to walk with just a little dyspnea.  No foncusion, fever, cough, sputum.  No wheezing respiratory distress.  No elbow pain, the elbow is much better able to move now.  Objective: Vitals:   01/03/18 1214 01/03/18 1529 01/03/18 1959 01/04/18 0437  BP: 124/63 115/84 (!) 124/59 127/80  Pulse: (!) 56 68 63 69  Resp: 20  18 19   Temp: (!) 97.4 F (36.3 C)  98.7 F (37.1 C) (!) 97.5 F (36.4 C)  TempSrc: Oral  Oral Oral  SpO2: 98%  97% 94%   Weight:    128.3 kg  Height:        Intake/Output Summary (Last 24 hours) at 01/04/2018 0929 Last data filed at 01/04/2018 0600 Gross per 24 hour  Intake 970 ml  Output 1675 ml  Net -705 ml   Filed Weights   01/02/18 0544 01/03/18 0500 01/04/18 0437  Weight: 129.9 kg 129.4 kg 128.3 kg    Examination: General appearance:  adult male, alert and in no acute distress.   HEENT: Anicteric, conjunctiva pink, lids and lashes normal. No nasal deformity, discharge, epistaxis.  Lips moist.   Skin: Warm and dry.  no jaundice.  No suspicious rashes or lesions. Cardiac: RRR, nl S1-S2, no murmurs appreciated.  Capillary refill is brisk.  JVP not visible due to habitusN LE pitting LE edema.  Radia  pulses 2+ and symmetric. Respiratory: Normal respiratory rate and rhythm.  CTAB without rales or wheezes. Abdomen: Abdomen soft.  no TTP. No ascites, distension, hepatosplenomegaly.   MSK: No deformities or effusions. Neuro: Awake and alert.  EOMI, moves all extremities. Speech fluent.    Psych: Sensorium intact and responding to questions, attention normal. Affect normal.  Judgment and insight appear normal.    Data Reviewed: I have personally reviewed following labs and imaging studies:  CBC: Recent Labs  Lab 12/30/17 0448 12/31/17 0613 01/01/18 0426 01/02/18 1013  WBC 12.3* 11.0* 8.4 6.6  NEUTROABS 11.1*  --   --   --   HGB 14.4 13.4 13.0 12.1*  HCT 43.4 41.4 39.4 36.5*  MCV 92.7  94.3 94.0 94.1  PLT 166 175 155 956   Basic Metabolic Panel: Recent Labs  Lab 12/30/17 0448 12/31/17 0613 01/01/18 0426 01/02/18 0530 01/03/18 0420  NA 142 141 140 140 141  K 3.6 3.1* 3.1* 3.1* 3.0*  CL 104 103 103 102 103  CO2 21* 26 26 24 28   GLUCOSE 172* 139* 132* 121* 117*  BUN 12 12 17 18 19   CREATININE 0.86 0.82 0.82 0.76 0.77  CALCIUM 10.1 10.3 10.1 10.0 9.9   GFR: Estimated Creatinine Clearance: 120.6 mL/min (by C-G formula based on SCr of 0.77 mg/dL). Liver Function Tests: Recent Labs    Lab 12/30/17 0448  AST 53*  ALT 41  ALKPHOS 90  BILITOT 1.4*  PROT 8.0  ALBUMIN 4.1   No results for input(s): LIPASE, AMYLASE in the last 168 hours. No results for input(s): AMMONIA in the last 168 hours. Coagulation Profile: No results for input(s): INR, PROTIME in the last 168 hours. Cardiac Enzymes: Recent Labs  Lab 12/30/17 1343 12/30/17 1722 12/30/17 2024 12/30/17 2257 01/01/18 0910  TROPONINI 0.76* 0.86* 0.88* 0.99* 0.45*   BNP (last 3 results) No results for input(s): PROBNP in the last 8760 hours. HbA1C: No results for input(s): HGBA1C in the last 72 hours. CBG: Recent Labs  Lab 01/03/18 0751 01/03/18 1121 01/03/18 1628 01/03/18 2058 01/04/18 0734  GLUCAP 114* 102* 98 115* 110*   Lipid Profile: No results for input(s): CHOL, HDL, LDLCALC, TRIG, CHOLHDL, LDLDIRECT in the last 72 hours. Thyroid Function Tests: No results for input(s): TSH, T4TOTAL, FREET4, T3FREE, THYROIDAB in the last 72 hours. Anemia Panel: No results for input(s): VITAMINB12, FOLATE, FERRITIN, TIBC, IRON, RETICCTPCT in the last 72 hours. Urine analysis:    Component Value Date/Time   COLORURINE YELLOW 12/30/2017 [place] 12/30/2017 0923   LABSPEC 1.010 12/30/2017 0923   PHURINE 5.0 12/30/2017 0923   GLUCOSEU NEGATIVE 12/30/2017 0923   HGBUR SMALL (A) 12/30/2017 0923   BILIRUBINUR NEGATIVE 12/30/2017 0923   KETONESUR NEGATIVE 12/30/2017 0923   PROTEINUR 30 (A) 12/30/2017 0923   UROBILINOGEN 0.2 03/24/2013 1255   NITRITE NEGATIVE 12/30/2017 0923   LEUKOCYTESUR NEGATIVE 12/30/2017 0923   Sepsis Labs: @LABRCNTIP (procalcitonin:4,lacticacidven:4)  ) Recent Results (from the past 240 hour(s))  Culture, blood (Routine X 2) w Reflex to ID Panel     Status: None (Preliminary result)   Collection Time: 12/30/17  9:08 AM  Result Value Ref Range Status   Specimen Description BLOOD RIGHT ANTECUBITAL  Final   Special Requests   Final    BOTTLES DRAWN AEROBIC AND  ANAEROBIC Blood Culture adequate volume   Culture   Final    NO GROWTH 4 DAYS Performed at [facility], [place] [address], [place], [place] [address]    Report Status PENDING  Incomplete  Culture, blood (Routine X 2) w Reflex to ID Panel     Status: None (Preliminary result)   Collection Time: 12/30/17  9:20 AM  Result Value Ref Range Status   Specimen Description BLOOD LEFT ANTECUBITAL  Final   Special Requests   Final    BOTTLES DRAWN AEROBIC AND ANAEROBIC Blood Culture adequate volume   Culture   Final    NO GROWTH 4 DAYS Performed at [facility], [place] [address] [place], [place] [address]    Report Status PENDING  Incomplete  Body fluid culture     Status: None (Preliminary result)   Collection Time: 01/03/18  2:07 PM  Result  Value Ref Range Status   Specimen Description SYNOVIAL LEFT ARM ELBOW  Final   Special Requests Normal  Final   Gram Stain   Final    NO WBC SEEN NO ORGANISMS SEEN Performed at [facility], [address] [place], [place] [address]    Culture PENDING  Incomplete   Report Status PENDING  Incomplete         Radiology Studies: Dg Chest Port 1 View  Result Date: 01/03/2018 CLINICAL DATA:  Shortness of breath. EXAM: PORTABLE CHEST 1 VIEW COMPARISON:  Radiograph of December 30, 2017. FINDINGS: The heart size and mediastinal contours are within normal limits. Both lungs are clear. No pneumothorax or pleural effusion is noted. The visualized skeletal structures are unremarkable. IMPRESSION: No acute cardiopulmonary abnormality seen. Electronically Signed   By: Marijo Conception, M.D.   On: 01/03/2018 09:53   Dg Fluoro Guided Needle Plc Aspiration/injection Loc  Result Date: 01/03/2018 CLINICAL DATA:  Left elbow pain with swelling and redness. EXAM: LEFT ELBOW OLECRANON BURSA ASPIRATION  UNDER FLUOROSCOPY FLUOROSCOPY TIME:  Fluoroscopy Time:  0 minutes 42 second Radiation Exposure Index (if provided by the fluoroscopic device): Number of  Acquired Spot Images: 0 PROCEDURE: The olecranon bursa is moderately swollen and erythematous and painful compatible with olecranon bursitis. Informed written consent was obtained. Patient signed a consent form. A time-out procedure was performed. Overlying skin prepped with Betadine, draped in the usual sterile fashion, and infiltrated locally with buffered Lidocaine. The olecranon bursa was aspirated with a 21 gauge needle. Small amount of bloody fluid was obtained and sent for laboratory studies including culture. Additional aspirations were also performed however I did not encounter a focal fluid collection. IMPRESSION: Findings consistent with olecranon bursitis. The olecranon bursa was aspirated with small amount of bloody fluid sent to the laboratory. Patient tolerated procedure well. Electronically Signed   By: Franchot Gallo M.D.   On: 01/03/2018 14:20        Scheduled Meds: . amLODipine  10 mg Oral Daily  . aspirin  324 mg Oral Once  . atorvastatin  40 mg Oral QPM  . carvedilol  25 mg Oral BID WC  . doxycycline  100 mg Oral Q12H  . enoxaparin (LOVENOX) injection  40 mg Subcutaneous Q24H  . folic acid  1 mg Oral Daily  . furosemide  40 mg Intravenous BID  . gabapentin  300 mg Oral TID  . insulin aspart  0-15 Units Subcutaneous TID WC  . lisinopril  40 mg Oral Daily  . multivitamin with minerals  1 tablet Oral Daily  . pantoprazole  40 mg Oral Daily  . senna-docusate  1 tablet Oral Daily  . sertraline  150 mg Oral Daily  . thiamine  100 mg Oral Daily   Continuous Infusions: . cefTRIAXone (ROCEPHIN)  IV Stopped (01/03/18 1100)     LOS: 4 days    Time spent: 25 minutes    Edwin Dada, MD [facility] 01/04/2018, 9:29 AM     Pager [phone] --- please page though AMION:  [email or web address] Password [email or web address] If 7PM-7AM, please contact night-coverage

## 2018-01-05 DIAGNOSIS — I5021 Acute systolic (congestive) heart failure: Secondary | ICD-10-CM

## 2018-01-05 DIAGNOSIS — R079 Chest pain, unspecified: Secondary | ICD-10-CM

## 2018-01-05 DIAGNOSIS — E1169 Type 2 diabetes mellitus with other specified complication: Secondary | ICD-10-CM

## 2018-01-05 LAB — CBC
HEMATOCRIT: 36.7 % — AB (ref 39.0–52.0)
Hemoglobin: 12.2 g/dL — ABNORMAL LOW (ref 13.0–17.0)
MCH: 31.2 pg (ref 26.0–34.0)
MCHC: 33.2 g/dL (ref 30.0–36.0)
MCV: 93.9 fL (ref 78.0–100.0)
Platelets: 181 10*3/uL (ref 150–400)
RBC: 3.91 MIL/uL — ABNORMAL LOW (ref 4.22–5.81)
RDW: 13.7 % (ref 11.5–15.5)
WBC: 5.4 10*3/uL (ref 4.0–10.5)

## 2018-01-05 LAB — BASIC METABOLIC PANEL
Anion gap: 12 (ref 5–15)
BUN: 15 mg/dL (ref 8–23)
CHLORIDE: 101 mmol/L (ref 98–111)
CO2: 30 mmol/L (ref 22–32)
Calcium: 10.5 mg/dL — ABNORMAL HIGH (ref 8.9–10.3)
Creatinine, Ser: 0.83 mg/dL (ref 0.61–1.24)
GFR calc Af Amer: >60 mL/min (ref >60)
GFR calc non Af Amer: >60 mL/min (ref >60)
Glucose, Bld: 113 mg/dL — ABNORMAL HIGH (ref 70–99)
POTASSIUM: 3.4 mmol/L — AB (ref 3.5–5.1)
SODIUM: 143 mmol/L (ref 135–145)

## 2018-01-05 LAB — GLUCOSE, CAPILLARY
GLUCOSE-CAPILLARY: 108 mg/dL — AB (ref 70–99)
Glucose-Capillary: 111 mg/dL — ABNORMAL HIGH (ref 70–99)

## 2018-01-05 NOTE — Progress Notes (Signed)
Pt continued to refuse bed alarm during this shift. Will continue to monitor.

## 2018-01-05 NOTE — Progress Notes (Signed)
Pt O2 sat was 91-95% while ambulating on room air this AM.

## 2018-01-05 NOTE — Progress Notes (Signed)
PT Cancellation Note  Patient Details Name: Francis Cardenas MRN: [id number] DOB: 04-26-1951   Cancelled Treatment:    Reason Eval/Treat Not Completed: PT screened, no needs identified, will sign off(New order received. Pt with improvement in status from 8/12 eval completed by SPT Samuella Bruin and no further acute needs. REcommend OPPT as noted on the 12th. Pt walked 400' this am with maintained SPO2 and no difficulty with NT )   Mikhaela Zaugg B Samirah Scarpati 01/05/2018, 8:18 AM  Elwyn Reach, [place]
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
## 2018-01-06 LAB — BODY FLUID CULTURE
Culture: NO GROWTH
Gram Stain: NONE SEEN
SPECIAL REQUESTS: NORMAL

## 2018-01-28 ENCOUNTER — Inpatient Hospital Stay (HOSPITAL_COMMUNITY)
Admission: EM | Admit: 2018-01-28 | Discharge: 2018-01-31 | DRG: 291 | Disposition: A | Discharge status: Home / Self Care | Payer: No Typology Code available for payment source | Attending: Internal Medicine | Admitting: Internal Medicine

## 2018-01-28 ENCOUNTER — Emergency Department (HOSPITAL_COMMUNITY): Payer: No Typology Code available for payment source

## 2018-01-28 ENCOUNTER — Encounter (HOSPITAL_COMMUNITY): Payer: Self-pay | Admitting: Emergency Medicine

## 2018-01-28 ENCOUNTER — Other Ambulatory Visit: Payer: Self-pay

## 2018-01-28 DIAGNOSIS — I110 Hypertensive heart disease with heart failure: Principal | ICD-10-CM | POA: Diagnosis present

## 2018-01-28 DIAGNOSIS — Z7984 Long term (current) use of oral hypoglycemic drugs: Secondary | ICD-10-CM

## 2018-01-28 DIAGNOSIS — F4310 Post-traumatic stress disorder, unspecified: Secondary | ICD-10-CM | POA: Diagnosis present

## 2018-01-28 DIAGNOSIS — I248 Other forms of acute ischemic heart disease: Secondary | ICD-10-CM | POA: Diagnosis present

## 2018-01-28 DIAGNOSIS — E1169 Type 2 diabetes mellitus with other specified complication: Secondary | ICD-10-CM | POA: Diagnosis not present

## 2018-01-28 DIAGNOSIS — J9601 Acute respiratory failure with hypoxia: Secondary | ICD-10-CM | POA: Diagnosis present

## 2018-01-28 DIAGNOSIS — Z6837 Body mass index (BMI) 37.0-37.9, adult: Secondary | ICD-10-CM

## 2018-01-28 DIAGNOSIS — E876 Hypokalemia: Secondary | ICD-10-CM | POA: Diagnosis present

## 2018-01-28 DIAGNOSIS — Z87891 Personal history of nicotine dependence: Secondary | ICD-10-CM | POA: Diagnosis not present

## 2018-01-28 DIAGNOSIS — K219 Gastro-esophageal reflux disease without esophagitis: Secondary | ICD-10-CM | POA: Diagnosis present

## 2018-01-28 DIAGNOSIS — E7800 Pure hypercholesterolemia, unspecified: Secondary | ICD-10-CM | POA: Diagnosis present

## 2018-01-28 DIAGNOSIS — F419 Anxiety disorder, unspecified: Secondary | ICD-10-CM | POA: Diagnosis present

## 2018-01-28 DIAGNOSIS — I5023 Acute on chronic systolic (congestive) heart failure: Secondary | ICD-10-CM

## 2018-01-28 DIAGNOSIS — M109 Gout, unspecified: Secondary | ICD-10-CM | POA: Diagnosis present

## 2018-01-28 DIAGNOSIS — I5043 Acute on chronic combined systolic (congestive) and diastolic (congestive) heart failure: Secondary | ICD-10-CM | POA: Diagnosis present

## 2018-01-28 DIAGNOSIS — F1010 Alcohol abuse, uncomplicated: Secondary | ICD-10-CM | POA: Diagnosis present

## 2018-01-28 DIAGNOSIS — E785 Hyperlipidemia, unspecified: Secondary | ICD-10-CM | POA: Diagnosis present

## 2018-01-28 DIAGNOSIS — E1140 Type 2 diabetes mellitus with diabetic neuropathy, unspecified: Secondary | ICD-10-CM | POA: Diagnosis present

## 2018-01-28 DIAGNOSIS — Z23 Encounter for immunization: Secondary | ICD-10-CM | POA: Diagnosis not present

## 2018-01-28 DIAGNOSIS — Z79899 Other long term (current) drug therapy: Secondary | ICD-10-CM

## 2018-01-28 DIAGNOSIS — R0603 Acute respiratory distress: Secondary | ICD-10-CM | POA: Diagnosis present

## 2018-01-28 DIAGNOSIS — J9602 Acute respiratory failure with hypercapnia: Secondary | ICD-10-CM

## 2018-01-28 DIAGNOSIS — N400 Enlarged prostate without lower urinary tract symptoms: Secondary | ICD-10-CM | POA: Diagnosis present

## 2018-01-28 DIAGNOSIS — C61 Malignant neoplasm of prostate: Secondary | ICD-10-CM | POA: Diagnosis present

## 2018-01-28 DIAGNOSIS — I361 Nonrheumatic tricuspid (valve) insufficiency: Secondary | ICD-10-CM | POA: Diagnosis not present

## 2018-01-28 DIAGNOSIS — F329 Major depressive disorder, single episode, unspecified: Secondary | ICD-10-CM | POA: Diagnosis present

## 2018-01-28 DIAGNOSIS — I509 Heart failure, unspecified: Secondary | ICD-10-CM | POA: Diagnosis not present

## 2018-01-28 DIAGNOSIS — G4733 Obstructive sleep apnea (adult) (pediatric): Secondary | ICD-10-CM | POA: Diagnosis present

## 2018-01-28 DIAGNOSIS — E66812 Obesity, class 2: Secondary | ICD-10-CM

## 2018-01-28 LAB — I-STAT ARTERIAL BLOOD GAS, ED
ACID-BASE EXCESS: 1 mmol/L (ref 0.0–2.0)
Bicarbonate: 26.6 mmol/L (ref 20.0–28.0)
O2 Saturation: 98 %
PCO2 ART: 44 mmHg (ref 32.0–48.0)
PH ART: 7.39 (ref 7.350–7.450)
PO2 ART: 111 mmHg — AB (ref 83.0–108.0)
Patient temperature: 98.8
TCO2: 28 mmol/L (ref 22–32)

## 2018-01-28 LAB — CBC WITH DIFFERENTIAL/PLATELET
Abs Immature Granulocytes: 0.1 10*3/uL (ref 0.0–0.1)
Basophils Absolute: 0.1 10*3/uL (ref 0.0–0.1)
Basophils Relative: 0 %
Eosinophils Absolute: 0.1 10*3/uL (ref 0.0–0.7)
Eosinophils Relative: 1 %
HEMATOCRIT: 45.5 % (ref 39.0–52.0)
Hemoglobin: 14.3 g/dL (ref 13.0–17.0)
IMMATURE GRANULOCYTES: 1 %
LYMPHS ABS: 1.2 10*3/uL (ref 0.7–4.0)
LYMPHS PCT: 10 %
MCH: 30 pg (ref 26.0–34.0)
MCHC: 31.4 g/dL (ref 30.0–36.0)
MCV: 95.4 fL (ref 78.0–100.0)
MONOS PCT: 3 %
Monocytes Absolute: 0.3 10*3/uL (ref 0.1–1.0)
NEUTROS PCT: 85 %
Neutro Abs: 10.4 10*3/uL — ABNORMAL HIGH (ref 1.7–7.7)
PLATELETS: 169 10*3/uL (ref 150–400)
RBC: 4.77 MIL/uL (ref 4.22–5.81)
RDW: 14.2 % (ref 11.5–15.5)
WBC: 12.1 10*3/uL — ABNORMAL HIGH (ref 4.0–10.5)

## 2018-01-28 LAB — BASIC METABOLIC PANEL
ANION GAP: 16 — AB (ref 5–15)
BUN: 9 mg/dL (ref 8–23)
CHLORIDE: 105 mmol/L (ref 98–111)
CO2: 21 mmol/L — AB (ref 22–32)
Calcium: 10.3 mg/dL (ref 8.9–10.3)
Creatinine, Ser: 0.89 mg/dL (ref 0.61–1.24)
GFR calc non Af Amer: >60 mL/min (ref >60)
Glucose, Bld: 222 mg/dL — ABNORMAL HIGH (ref 70–99)
POTASSIUM: 3.9 mmol/L (ref 3.5–5.1)
Sodium: 142 mmol/L (ref 135–145)

## 2018-01-28 LAB — TROPONIN I
TROPONIN I: 0.21 ng/mL — AB (ref <0.03)
Troponin I: 0.17 ng/mL (ref <0.03)

## 2018-01-28 LAB — GLUCOSE, CAPILLARY
GLUCOSE-CAPILLARY: 100 mg/dL — AB (ref 70–99)
Glucose-Capillary: 106 mg/dL — ABNORMAL HIGH (ref 70–99)

## 2018-01-28 LAB — I-STAT TROPONIN, ED: Troponin i, poc: 0.04 ng/mL (ref 0.00–0.08)

## 2018-01-28 LAB — BRAIN NATRIURETIC PEPTIDE: B NATRIURETIC PEPTIDE 5: 562.9 pg/mL — AB (ref 0.0–100.0)

## 2018-01-28 MED ORDER — PANTOPRAZOLE SODIUM 40 MG PO TBEC
40.0000 mg | DELAYED_RELEASE_TABLET | Freq: Every day | ORAL | Status: DC
  Administered 2018-01-28 – 2018-01-31 (×4): 40 mg via ORAL
  Filled 2018-01-28 (×4): qty 1

## 2018-01-28 MED ORDER — CARVEDILOL 25 MG PO TABS
25.0000 mg | ORAL_TABLET | Freq: Two times a day (BID) | ORAL | Status: DC
  Administered 2018-01-28 – 2018-01-31 (×6): 25 mg via ORAL
  Filled 2018-01-28 (×6): qty 1

## 2018-01-28 MED ORDER — POTASSIUM CHLORIDE CRYS ER 20 MEQ PO TBCR
30.0000 meq | EXTENDED_RELEASE_TABLET | Freq: Every day | ORAL | Status: DC
  Administered 2018-01-28 – 2018-01-29 (×2): 30 meq via ORAL
  Filled 2018-01-28 (×2): qty 1

## 2018-01-28 MED ORDER — ENOXAPARIN SODIUM 40 MG/0.4ML ~~LOC~~ SOLN
40.0000 mg | SUBCUTANEOUS | Status: DC
  Administered 2018-01-28 – 2018-01-30 (×3): 40 mg via SUBCUTANEOUS
  Filled 2018-01-28 (×3): qty 0.4

## 2018-01-28 MED ORDER — ASPIRIN EC 81 MG PO TBEC
81.0000 mg | DELAYED_RELEASE_TABLET | Freq: Every day | ORAL | Status: DC
  Administered 2018-01-28 – 2018-01-31 (×4): 81 mg via ORAL
  Filled 2018-01-28 (×4): qty 1

## 2018-01-28 MED ORDER — GABAPENTIN 300 MG PO CAPS
300.0000 mg | ORAL_CAPSULE | Freq: Three times a day (TID) | ORAL | Status: DC
  Administered 2018-01-28 – 2018-01-31 (×9): 300 mg via ORAL
  Filled 2018-01-28 (×9): qty 1

## 2018-01-28 MED ORDER — FUROSEMIDE 10 MG/ML IJ SOLN
40.0000 mg | Freq: Two times a day (BID) | INTRAMUSCULAR | Status: AC
  Administered 2018-01-28 – 2018-01-30 (×5): 40 mg via INTRAVENOUS
  Filled 2018-01-28 (×5): qty 4

## 2018-01-28 MED ORDER — FUROSEMIDE 10 MG/ML IJ SOLN
40.0000 mg | Freq: Once | INTRAMUSCULAR | Status: AC
  Administered 2018-01-28: 40 mg via INTRAVENOUS
  Filled 2018-01-28: qty 4

## 2018-01-28 MED ORDER — SERTRALINE HCL 50 MG PO TABS
150.0000 mg | ORAL_TABLET | Freq: Every day | ORAL | Status: DC
  Administered 2018-01-28 – 2018-01-31 (×4): 150 mg via ORAL
  Filled 2018-01-28 (×4): qty 1

## 2018-01-28 MED ORDER — ZOLPIDEM TARTRATE 5 MG PO TABS
5.0000 mg | ORAL_TABLET | Freq: Every evening | ORAL | Status: DC | PRN
  Administered 2018-01-28 – 2018-01-30 (×3): 5 mg via ORAL
  Filled 2018-01-28 (×3): qty 1

## 2018-01-28 MED ORDER — AMLODIPINE BESYLATE 10 MG PO TABS
10.0000 mg | ORAL_TABLET | Freq: Every day | ORAL | Status: DC
  Administered 2018-01-28 – 2018-01-31 (×4): 10 mg via ORAL
  Filled 2018-01-28 (×4): qty 1

## 2018-01-28 MED ORDER — PRAVASTATIN SODIUM 40 MG PO TABS
80.0000 mg | ORAL_TABLET | Freq: Every day | ORAL | Status: DC
Start: 2018-01-28 — End: 2018-01-28

## 2018-01-28 MED ORDER — ATORVASTATIN CALCIUM 40 MG PO TABS
40.0000 mg | ORAL_TABLET | Freq: Every evening | ORAL | Status: DC
Start: 2018-01-28 — End: 2018-01-31
  Administered 2018-01-28 – 2018-01-30 (×3): 40 mg via ORAL
  Filled 2018-01-28 (×3): qty 1

## 2018-01-28 MED ORDER — LISINOPRIL 40 MG PO TABS
40.0000 mg | ORAL_TABLET | Freq: Every day | ORAL | Status: DC
  Administered 2018-01-28 – 2018-01-31 (×4): 40 mg via ORAL
  Filled 2018-01-28 (×4): qty 1

## 2018-01-28 NOTE — Progress Notes (Signed)
Pt off BIPAP and 4L  Pt states breathing much better

## 2018-01-28 NOTE — Progress Notes (Signed)
Patient admitted from ED with BiPAP at 35, sats 99% and no visible increased work of breathing. He is oriented to room, unit and call light, telemetry applied and CCMD notified. CHG bath given and skin intact.

## 2018-01-28 NOTE — Progress Notes (Signed)
Francis Cardenas did not want urinary catheter anymore so was removed. He prefers to use urinal for now.

## 2018-01-28 NOTE — Progress Notes (Signed)
Patient weaned to 4L O2 and tolerating well with sats at 99%

## 2018-01-28 NOTE — H&P (Signed)
History and Physical  Francis Cardenas KXF:[id number] DOB: 1951/04/16 DOA: 01/28/2018  Referring physician: Isla Pence, MD PCP: System, Provider Not In  Outpatient Specialists:none Patient coming from: home & is able to ambulate   Chief Complaint: Shortness of breath  HPI: Francis Cardenas is a 67 y.o. male with medical history significant for prostate cancer type 2 diabetes mellitus hypertension hyperlipidemia.Pt presents to the ED today with sob.  Most hx obtained from EMS as he is extremely sob.  The sx started this morning suddenly.  He tried his inhaler which did not help.  He called EMS when breathing worsened.  His initial RA O2 sats were in the 40s.  EMS put him directly on cpap and brought him here.  He was hypertensive, so he was given 1 SL nitro as well.  Breathing has improved en route, but he is still very sob.  He was admitted from 8/10 to 8/16 for CHF exacerbation.  He required bipap initially during that admission.  Pt said he continues to drink 18 beers/day.  He has doubled his lasix a few times since Wednesday, the 4th   ED Course: When he presented to the emergency department he was  distressed. Tachycardia Accessory muscle usage present. Tachypnea noted. He is in respiratory distress.   diaphoretic.  anxious.   IV Lasix was given with good urine output and improvement in his respiratory distress.  He was also started on BiPAP   Review of Systems:  . Pt complains of shortness of breath    Review of systems are otherwise negative   Past Medical History:  Diagnosis Date  . BPH (benign prostatic hyperplasia)   . Cancer [facility])    prostate  . Diabetes mellitus without complication ([place])   . Hypercholesteremia   . Hypertension    Past Surgical History:  Procedure Laterality Date  . ACHILLES TENDON REPAIR Left   . KNEE ARTHROSCOPY Left   . TONSILLECTOMY    . WRIST FRACTURE SURGERY Left     Social History:  reports that he has quit smoking. He has never used  smokeless tobacco. He reports that he drinks about 18.0 standard drinks of alcohol per week. He reports that he does not use drugs.   No Known Allergies  History reviewed. No pertinent family history.   Prior to Admission medications   Medication Sig Start Date End Date Taking? Authorizing Provider  albuterol (PROVENTIL HFA;VENTOLIN HFA) 108 (90 BASE) MCG/ACT inhaler Inhale 2 puffs into the lungs every 6 (six) hours as needed for wheezing.    [provider]  amLODipine (NORVASC) 10 MG tablet Take 10 mg by mouth daily.    [provider]  atorvastatin (LIPITOR) 40 MG tablet Take 40 mg by mouth every evening.    [provider]  carvedilol (COREG) 25 MG tablet Take 1 tablet (25 mg total) by mouth 2 (two) times daily with a meal. For hypertension. 03/31/13   Ruben Im, PA-C  furosemide (LASIX) 40 MG tablet Take 40 mg by mouth daily.    [provider]  gabapentin (NEURONTIN) 300 MG capsule Take 300 mg by mouth 3 (three) times daily.    [provider]  lisinopril (PRINIVIL,ZESTRIL) 40 MG tablet Take 1 tablet (40 mg total) by mouth daily. For hypertension. 03/31/13   Ruben Im, PA-C  metFORMIN (GLUCOPHAGE) 500 MG tablet Take 500 mg by mouth daily with breakfast.    [provider]  omeprazole (PRILOSEC) 20 MG capsule Take 1  capsule (20 mg total) by mouth daily. For reflux Patient not taking: Reported on 12/30/2017 03/31/13   Nena Polio T, PA-C  omeprazole (PRILOSEC) 40 MG capsule Take 40 mg by mouth every evening.    [provider]  pravastatin (PRAVACHOL) 40 MG tablet Take 2 tablets (80 mg total) by mouth at bedtime. For dyslipidemia. Patient not taking: Reported on 12/30/2017 03/31/13   Nena Polio T, PA-C  sertraline (ZOLOFT) 100 MG tablet Take one pill by mouth each day for depression and anxiety. Patient taking differently: Take 150 mg by mouth daily.  03/31/13   Ruben Im, PA-C    Physical Exam: BP (!)  161/64   Pulse 79   Resp 17   SpO2 98%   Constitutional: He is oriented to person, place, and time. He appears  not distressed.  BiPAP is in use HEENT:  Head: Normocephalic and atraumatic.  Right Ear: External ear normal.  Left Ear: External ear normal.  Nose: Nose normal.  Mouth/Throat: Oropharynx is clear and moist.  Eyes: Pupils are equal, round, and reactive to light. Conjunctivae and EOM are normal.  Neck: Normal range of motion. Neck supple.  Cardiovascular: Regular rhythm, normal heart sounds and intact distal pulses. Pulmonary/Chest:  Bilateral wheezing Abdominal: Soft. Bowel sounds are normal.  Musculoskeletal: He exhibits edema.  Neurological: He is alert and oriented to person, place, and time.  Skin: Skin is warm. Capillary refill takes less than 2 seconds.  Psychiatric: His behavior is normal. Judgment and thought content normal. His mood appears  Nursing note and vitals reviewed.         Labs on Admission:  Basic Metabolic Panel: Recent Labs  Lab 01/28/18 0803  NA 142  K 3.9  CL 105  CO2 21*  GLUCOSE 222*  BUN 9  CREATININE 0.89  CALCIUM 10.3   Liver Function Tests: No results for input(s): AST, ALT, ALKPHOS, BILITOT, PROT, ALBUMIN in the last 168 hours. No results for input(s): LIPASE, AMYLASE in the last 168 hours. No results for input(s): AMMONIA in the last 168 hours. CBC: Recent Labs  Lab 01/28/18 0803  WBC 12.1*  NEUTROABS 10.4*  HGB 14.3  HCT 45.5  MCV 95.4  PLT 169   Cardiac Enzymes: No results for input(s): CKTOTAL, CKMB, CKMBINDEX, TROPONINI in the last 168 hours.  BNP (last 3 results) Recent Labs    12/30/17 0448 01/28/18 0804  BNP 258.5* 562.9*    ProBNP (last 3 results) No results for input(s): PROBNP in the last 8760 hours.  CBG: No results for input(s): GLUCAP in the last 168 hours.  Radiological Exams on Admission: Dg Chest Port 1 View  Result Date: 01/28/2018 CLINICAL DATA:  Respiratory distress starting at 4 a.m.  Hypoxia. Diaphoresis. EXAM: PORTABLE CHEST 1 VIEW COMPARISON:  01/03/2018 FINDINGS: Abnormal interstitial accentuation with Kerley B lines. Mild cardiomegaly. Faint ground-glass opacities in the lung bases, right greater than left. Mild thickening of the minor fissure. Subtle blunting of the left lateral costophrenic angle. IMPRESSION: 1. Acute pulmonary edema with mild cardiomegaly, Kerley B lines, and early airspace edema mildly asymmetric towards the right lung base. 2. Trace left pleural effusion. Electronically Signed   By: Van Clines M.D.   On: 01/28/2018 08:40    EKG: Per ER MD  Assessment/Plan Present on Admission: **None** Acute on chronic congestive heart failure Patient was started on BiPAP he was admitted to stepdown unit Alcohol abuse Acute respiratory failure with hypoxia  Active Problems:   CHF (congestive heart  failure) ([place]) Alcohol abuse Acute respiratory failure with hypoxia  DVT prophylaxis: Lovenox  Code Status: Full  Family Communication: None  Disposition Plan: Home  Consults called: Cardiology  Admission status: Inpatient    Cristal Deer MD [facility] Pager [phone]  If 7PM-7AM, please contact night-coverage [email or web address] Password [facility]  01/28/2018, 9:36 AM

## 2018-01-28 NOTE — ED Provider Notes (Signed)
[place] EMERGENCY DEPARTMENT Provider Note   CSN: [id number] Arrival date & time: 01/28/18  0539     History   Chief Complaint Chief Complaint  Patient presents with  . Respiratory Distress    HPI Francis Cardenas is a 67 y.o. male.  Pt presents to the ED today with sob.  Most hx obtained from EMS as he is extremely sob.  The sx started this morning suddenly.  He tried his inhaler which did not help.  He called EMS when breathing worsened.  His initial RA O2 sats were in the 40s.  EMS put him directly on cpap and brought him here.  He was hypertensive, so he was given 1 SL nitro as well.  Breathing has improved en route, but he is still very sob.  He was admitted from 8/10 to 8/16 for CHF exacerbation.  He required bipap initially during that admission.  Pt said he continues to drink 18 beers/day.  He has doubled his lasix a few times since Wednesday, the 4th.     Past Medical History:  Diagnosis Date  . BPH (benign prostatic hyperplasia)   . Cancer [facility])    prostate  . Diabetes mellitus without complication ([place])   . Hypercholesteremia   . Hypertension     Patient Active Problem List   Diagnosis Date Noted  . Acute CHF (congestive heart failure) ([place]) 12/31/2017  . Acute respiratory failure with hypoxia ([place]) 12/30/2017  . Acute exacerbation of CHF (congestive heart failure) ([place]) 12/30/2017  . Chest pain syndrome 12/30/2017  . Carcinoma of prostate ([place]) 08/02/2016  . COPD (chronic obstructive pulmonary disease) ([place]) 08/02/2016  . GERD (gastroesophageal reflux disease) 08/02/2016  . Hyperlipidemia 08/02/2016  . Peripheral neuropathy 08/02/2016  . Steatosis of liver 08/02/2016  . Type 2 diabetes mellitus with other specified complication ([place]) 76/73/4193  . Benign essential hypertension 06/11/2013  . Alcohol dependence ([place]) 03/26/2013  . PTSD (post-traumatic stress disorder) 03/26/2013  . Alcohol abuse 03/24/2013  . Depressive disorder  03/24/2013    Past Surgical History:  Procedure Laterality Date  . ACHILLES TENDON REPAIR Left   . KNEE ARTHROSCOPY Left   . TONSILLECTOMY    . WRIST FRACTURE SURGERY Left         Home Medications    Prior to Admission medications   Medication Sig Start Date End Date Taking? Authorizing Provider  albuterol (PROVENTIL HFA;VENTOLIN HFA) 108 (90 BASE) MCG/ACT inhaler Inhale 2 puffs into the lungs every 6 (six) hours as needed for wheezing.    [provider]  amLODipine (NORVASC) 10 MG tablet Take 10 mg by mouth daily.    [provider]  atorvastatin (LIPITOR) 40 MG tablet Take 40 mg by mouth every evening.    [provider]  carvedilol (COREG) 25 MG tablet Take 1 tablet (25 mg total) by mouth 2 (two) times daily with a meal. For hypertension. 03/31/13   Ruben Im, PA-C  furosemide (LASIX) 40 MG tablet Take 40 mg by mouth daily.    [provider]  gabapentin (NEURONTIN) 300 MG capsule Take 300 mg by mouth 3 (three) times daily.    [provider]  lisinopril (PRINIVIL,ZESTRIL) 40 MG tablet Take 1 tablet (40 mg total) by mouth daily. For hypertension. 03/31/13   Ruben Im, PA-C  metFORMIN (GLUCOPHAGE) 500 MG tablet Take 500 mg by mouth daily with breakfast.    [provider]  omeprazole (PRILOSEC) 20 MG capsule Take 1 capsule (20  mg total) by mouth daily. For reflux Patient not taking: Reported on 12/30/2017 03/31/13   Nena Polio T, PA-C  omeprazole (PRILOSEC) 40 MG capsule Take 40 mg by mouth every evening.    [provider]  pravastatin (PRAVACHOL) 40 MG tablet Take 2 tablets (80 mg total) by mouth at bedtime. For dyslipidemia. Patient not taking: Reported on 12/30/2017 03/31/13   Nena Polio T, PA-C  sertraline (ZOLOFT) 100 MG tablet Take one pill by mouth each day for depression and anxiety. Patient taking differently: Take 150 mg by mouth daily.  03/31/13   Ruben Im PA-C    Family  History History reviewed. No pertinent family history.  Social History Social History   Tobacco Use  . Smoking status: Former Research scientist (life sciences)  . Smokeless tobacco: Never Used  Substance Use Topics  . Alcohol use: Yes    Alcohol/week: 18.0 standard drinks    Types: 18 Cans of beer per week    Comment: 18 cans beer/day  . Drug use: No     Allergies   Patient has no known allergies.   Review of Systems Review of Systems  Respiratory: Positive for shortness of breath.   All other systems reviewed and are negative.    Physical Exam Updated Vital Signs BP (!) 161/64   Pulse 79   Resp 17   SpO2 98%   Physical Exam  Constitutional: He is oriented to person, place, and time. He appears distressed.  HENT:  Head: Normocephalic and atraumatic.  Right Ear: External ear normal.  Left Ear: External ear normal.  Nose: Nose normal.  Mouth/Throat: Oropharynx is clear and moist.  Eyes: Pupils are equal, round, and reactive to light. Conjunctivae and EOM are normal.  Neck: Normal range of motion. Neck supple.  Cardiovascular: Regular rhythm, normal heart sounds and intact distal pulses. Tachycardia present.  Pulmonary/Chest: Accessory muscle usage present. Tachypnea noted. He is in respiratory distress.  Abdominal: Soft. Bowel sounds are normal.  Musculoskeletal: He exhibits edema.  Neurological: He is alert and oriented to person, place, and time.  Skin: Skin is warm. Capillary refill takes less than 2 seconds. He is diaphoretic.  Psychiatric: His behavior is normal. Judgment and thought content normal. His mood appears anxious.  Nursing note and vitals reviewed.    ED Treatments / Results  Labs (all labs ordered are listed, but only abnormal results are displayed) Labs Reviewed  BASIC METABOLIC PANEL - Abnormal; Notable for the following components:      Result Value   CO2 21 (*)    Glucose, Bld 222 (*)    Anion gap 16 (*)    All other components within normal limits  CBC WITH  DIFFERENTIAL/PLATELET - Abnormal; Notable for the following components:   WBC 12.1 (*)    Neutro Abs 10.4 (*)    All other components within normal limits  BRAIN NATRIURETIC PEPTIDE - Abnormal; Notable for the following components:   B Natriuretic Peptide 562.9 (*)    All other components within normal limits  I-STAT TROPONIN, ED  I-STAT ARTERIAL BLOOD GAS, ED    EKG EKG Interpretation  Date/Time:  Sunday January 28 2018 07:48:53 EDT Ventricular Rate:  108 PR Interval:    QRS Duration: 107 QT Interval:  355 QTC Calculation: 476 R Axis:   86 Text Interpretation:  Sinus tachycardia Borderline right axis deviation Low voltage, precordial leads Nonspecific T abnormalities, lateral leads Borderline prolonged QT interval Since last tracing rate faster Confirmed by Isla Pence [phone]) on  01/28/2018 7:54:01 AM   Radiology Dg Chest Port 1 View  Result Date: 01/28/2018 CLINICAL DATA:  Respiratory distress starting at 4 a.m. Hypoxia. Diaphoresis. EXAM: PORTABLE CHEST 1 VIEW COMPARISON:  01/03/2018 FINDINGS: Abnormal interstitial accentuation with Kerley B lines. Mild cardiomegaly. Faint ground-glass opacities in the lung bases, right greater than left. Mild thickening of the minor fissure. Subtle blunting of the left lateral costophrenic angle. IMPRESSION: 1. Acute pulmonary edema with mild cardiomegaly, Kerley B lines, and early airspace edema mildly asymmetric towards the right lung base. 2. Trace left pleural effusion. Electronically Signed   By: Van Clines M.D.   On: 01/28/2018 08:40    Procedures Procedures (including critical care time)  Medications Ordered in ED Medications  furosemide (LASIX) injection 40 mg (40 mg Intravenous Given 01/28/18 0840)     Initial Impression / Assessment and Plan / ED Course  I have reviewed the triage vital signs and the nursing notes.  Pertinent labs & imaging results that were available during my care of the patient were reviewed by me  and considered in my medical decision making (see chart for details).    CRITICAL CARE Performed by: Isla Pence   Total critical care time: 30 minutes  Critical care time was exclusive of separately billable procedures and treating other patients.  Critical care was necessary to treat or prevent imminent or life-threatening deterioration.  Critical care was time spent personally by me on the following activities: development of treatment plan with patient and/or surrogate as well as nursing, discussions with consultants, evaluation of patient's response to treatment, examination of patient, obtaining history from patient or surrogate, ordering and performing treatments and interventions, ordering and review of laboratory studies, ordering and review of radiographic studies, pulse oximetry and re-evaluation of patient's condition.  Pt immediately placed on Bipap.  He is saturating in the 90s on our bipap.  The bipap has made pt look much more comfortable.  He is no longer diaphoretic and is able to talk.  CHF corresponds to clinical suspicion of CHF.  The pt is responding to lasix as well.  He is not currently showing any signs of withdrawal due to alcohol abuse, but this will need to be monitored.   Pt d/w [facility] (Dr. Kyung Bacca) who will admit.  Final Clinical Impressions(s) / ED Diagnoses   Final diagnoses:  Acute respiratory failure with hypoxia ([place])  Acute on chronic congestive heart failure, unspecified heart failure type [facility])  Alcohol abuse    ED Discharge Orders    None       Isla Pence, MD 01/28/18 [phone]

## 2018-01-28 NOTE — Progress Notes (Signed)
Pt transported on Bipap from ED to 4H60 without complications.

## 2018-01-28 NOTE — ED Triage Notes (Signed)
Pt here from home with c/o resp distress that started around 3 am , sats by fire 45 % cyanotic , sats up to 85 % on cpap . Pt diaphoretic on arrival pt also had 1 nitro by ems

## 2018-01-28 NOTE — ED Notes (Signed)
Report called  

## 2018-01-29 ENCOUNTER — Inpatient Hospital Stay (HOSPITAL_COMMUNITY): Payer: No Typology Code available for payment source

## 2018-01-29 DIAGNOSIS — I361 Nonrheumatic tricuspid (valve) insufficiency: Secondary | ICD-10-CM

## 2018-01-29 DIAGNOSIS — I509 Heart failure, unspecified: Secondary | ICD-10-CM

## 2018-01-29 DIAGNOSIS — I110 Hypertensive heart disease with heart failure: Secondary | ICD-10-CM | POA: Diagnosis not present

## 2018-01-29 LAB — HIV ANTIBODY (ROUTINE TESTING W REFLEX): HIV SCREEN 4TH GENERATION: NONREACTIVE

## 2018-01-29 LAB — BLOOD GAS, ARTERIAL
ACID-BASE EXCESS: 5.2 mmol/L — AB (ref 0.0–2.0)
BICARBONATE: 29.5 mmol/L — AB (ref 20.0–28.0)
Drawn by: 249101
O2 Content: 3 L/min
O2 Saturation: 97.1 %
PH ART: 7.423 (ref 7.350–7.450)
Patient temperature: 98.6
pCO2 arterial: 46 mmHg (ref 32.0–48.0)
pO2, Arterial: 94.2 mmHg (ref 83.0–108.0)

## 2018-01-29 LAB — GLUCOSE, CAPILLARY
GLUCOSE-CAPILLARY: 125 mg/dL — AB (ref 70–99)
GLUCOSE-CAPILLARY: 104 mg/dL — AB (ref 70–99)
GLUCOSE-CAPILLARY: 107 mg/dL — AB (ref 70–99)
Glucose-Capillary: 124 mg/dL — ABNORMAL HIGH (ref 70–99)

## 2018-01-29 LAB — BASIC METABOLIC PANEL
ANION GAP: 10 (ref 5–15)
BUN: 10 mg/dL (ref 8–23)
CALCIUM: 9.7 mg/dL (ref 8.9–10.3)
CO2: 27 mmol/L (ref 22–32)
Chloride: 106 mmol/L (ref 98–111)
Creatinine, Ser: 0.8 mg/dL (ref 0.61–1.24)
GFR calc Af Amer: >60 mL/min (ref >60)
GLUCOSE: 111 mg/dL — AB (ref 70–99)
Potassium: 3.1 mmol/L — ABNORMAL LOW (ref 3.5–5.1)
SODIUM: 143 mmol/L (ref 135–145)

## 2018-01-29 LAB — ECHOCARDIOGRAM LIMITED
HEIGHTINCHES: 71 in
Weight: 4318.4 oz

## 2018-01-29 LAB — TROPONIN I: Troponin I: 0.11 ng/mL (ref <0.03)

## 2018-01-29 MED ORDER — POTASSIUM CHLORIDE CRYS ER 20 MEQ PO TBCR
40.0000 meq | EXTENDED_RELEASE_TABLET | Freq: Two times a day (BID) | ORAL | Status: AC
  Administered 2018-01-29 – 2018-01-30 (×3): 40 meq via ORAL
  Filled 2018-01-29 (×3): qty 2

## 2018-01-29 MED ORDER — MULTI-VITAMIN/MINERALS PO TABS: 1.0000 | ORAL_TABLET | Freq: Every day | ORAL | Status: DC

## 2018-01-29 MED ORDER — VITAMIN B-12 1000 MCG PO TABS
1000.0000 ug | ORAL_TABLET | Freq: Every day | ORAL | Status: DC
  Administered 2018-01-29 – 2018-01-31 (×3): 1000 ug via ORAL
  Filled 2018-01-29 (×3): qty 1

## 2018-01-29 MED ORDER — ASPIRIN EC 81 MG PO TBEC: 81.0000 mg | DELAYED_RELEASE_TABLET | Freq: Every day | ORAL | Status: DC

## 2018-01-29 MED ORDER — INFLUENZA VAC SPLIT HIGH-DOSE 0.5 ML IM SUSY
0.5000 mL | PREFILLED_SYRINGE | INTRAMUSCULAR | Status: AC
  Administered 2018-01-29: 0.5 mL via INTRAMUSCULAR
  Filled 2018-01-29: qty 0.5

## 2018-01-29 MED ORDER — ACETAMINOPHEN 500 MG PO TABS: 500.0000 mg | ORAL_TABLET | Freq: Four times a day (QID) | ORAL | Status: DC | PRN

## 2018-01-29 MED ORDER — MOMETASONE FURO-FORMOTEROL FUM 100-5 MCG/ACT IN AERO
2.0000 | INHALATION_SPRAY | Freq: Two times a day (BID) | RESPIRATORY_TRACT | Status: DC
  Administered 2018-01-29 – 2018-01-31 (×4): 2 via RESPIRATORY_TRACT
  Filled 2018-01-29: qty 8.8

## 2018-01-29 MED ORDER — ADULT MULTIVITAMIN W/MINERALS CH
1.0000 | ORAL_TABLET | Freq: Every day | ORAL | Status: DC
  Administered 2018-01-30 – 2018-01-31 (×2): 1 via ORAL
  Filled 2018-01-29 (×2): qty 1

## 2018-01-29 MED ORDER — TRAMADOL HCL 50 MG PO TABS
50.0000 mg | ORAL_TABLET | Freq: Four times a day (QID) | ORAL | Status: DC | PRN
  Administered 2018-01-29: 50 mg via ORAL
  Filled 2018-01-29: qty 1

## 2018-01-29 MED ORDER — DOCUSATE SODIUM 100 MG PO CAPS: 100.0000 mg | ORAL_CAPSULE | Freq: Every day | ORAL | Status: DC | PRN

## 2018-01-29 MED ORDER — OXYCODONE HCL 5 MG PO TABS
5.0000 mg | ORAL_TABLET | Freq: Four times a day (QID) | ORAL | Status: DC | PRN
  Administered 2018-01-29 – 2018-01-30 (×2): 5 mg via ORAL
  Filled 2018-01-29 (×2): qty 1

## 2018-01-29 MED ORDER — ACETAMINOPHEN 325 MG PO TABS: 650.0000 mg | ORAL_TABLET | Freq: Four times a day (QID) | ORAL | Status: DC | PRN

## 2018-01-29 NOTE — Progress Notes (Addendum)
[facility] TEAM 1 - Stepdown/ICU TEAM  ELIYAS SUDDRETH  QMG:[id number] DOB: 1950/11/28 DOA: 01/28/2018 PCP: System, Provider Not In    Brief Narrative:  67 y.o. male with a hx of prostate, systolic CHF (EF 32-67%), DM2, HTN, and HLD who presented to the ED today with the acute onset of SOB. EMS found his initial RA O2 sats in the 40s. EMS put him directly on cpap and brought him to the ED. He continues to drink 18 beers/day.   Subjective: Reports feeling much better.  Much less sob, though still requiring some O2 support.  Denies cp, n/v, or abdom pain.    Assessment & Plan:  Acute hypoxic respiratory failure  Much improved w/ diuresis - wean O2 as able   Acute on chronic systolic CHF 124.5YK at time of D/C 01/05/18 - net negative ~5.5L since admit already - cont to diurese   Filed Weights   01/28/18 1300 01/29/18 0459  Weight: 123.3 kg 122.4 kg    Hypokalemia  Due to diuresis - supplement and follow  Mildly elevated troponin No sx to suggest ACS - only mildly elevated w/o a signif peak - likley simply due to acute distress of signif hypoxia - cosider outpt cards eval  Alcohol abuse Admits to ongoing daily drinking - has quite cold-turkey in past - follow for DTs (no sign of such thus far)  Hypertension Follow w/ ongoing diuresis   Diabetes CBG well controlled at this time   Gout of elbow Quiescent  DVT prophylaxis: lovenox  Code Status: FULL CODE Family Communication: no family present at time of exam  Disposition Plan: stable for transfer to tele bed   Consultants:  none  Antimicrobials:  None    Objective: Blood pressure (!) 145/69, pulse 66, temperature 98 F (36.7 C), temperature source Oral, resp. rate 17, height 5\' 11"  (1.803 m), weight 122.4 kg, SpO2 98 %.  Intake/Output Summary (Last 24 hours) at 01/29/2018 1534 Last data filed at 01/29/2018 1105 Gross per 24 hour  Intake 120 ml  Output 2855 ml  Net -2735 ml   Filed Weights   01/28/18 1300  01/29/18 0459  Weight: 123.3 kg 122.4 kg    Examination: General: No acute respiratory distress at rest in chair  Lungs: bibasilar crackles - no wheezing  Cardiovascular: Regular rate and rhythm without murmur  Abdomen: Nontender, nondistended, overweight, soft, bowel sounds positive, no rebound Extremities: 1+ B LE edema   CBC: Recent Labs  Lab 01/28/18 0803  WBC 12.1*  NEUTROABS 10.4*  HGB 14.3  HCT 45.5  MCV 95.4  PLT 998   Basic Metabolic Panel: Recent Labs  Lab 01/28/18 0803 01/29/18 0313  NA 142 143  K 3.9 3.1*  CL 105 106  CO2 21* 27  GLUCOSE 222* 111*  BUN 9 10  CREATININE 0.89 0.80  CALCIUM 10.3 9.7   GFR: Estimated Creatinine Clearance: 117.6 mL/min (by C-G formula based on SCr of 0.8 mg/dL).  Liver Function Tests: No results for input(s): AST, ALT, ALKPHOS, BILITOT, PROT, ALBUMIN in the last 168 hours. No results for input(s): LIPASE, AMYLASE in the last 168 hours. No results for input(s): AMMONIA in the last 168 hours.  Cardiac Enzymes: Recent Labs  Lab 01/28/18 1416 01/28/18 2041 01/29/18 0313  TROPONINI 0.21* 0.17* 0.11*    HbA1C: Hgb A1c MFr Bld  Date/Time Value Ref Range Status  12/30/2017 08:18 AM 5.4 4.8 - 5.6 % Final    Comment:    (NOTE) Pre diabetes:  5.7%-6.4% Diabetes:              >6.4% Glycemic control for   <7.0% adults with diabetes     CBG: Recent Labs  Lab 01/28/18 1604 01/28/18 2131 01/29/18 0633 01/29/18 1122  GLUCAP 100* 106* 125* 104*    Scheduled Meds: . amLODipine  10 mg Oral Daily  . aspirin EC  81 mg Oral Daily  . atorvastatin  40 mg Oral QPM  . carvedilol  25 mg Oral BID WC  . enoxaparin (LOVENOX) injection  40 mg Subcutaneous Q24H  . furosemide  40 mg Intravenous Q12H  . gabapentin  300 mg Oral TID  . lisinopril  40 mg Oral Daily  . pantoprazole  40 mg Oral Daily  . potassium chloride  30 mEq Oral Daily  . sertraline  150 mg Oral Daily     LOS: 1 day   Cherene Altes,  MD [facility] Office  [phone] Pager - Text Page per Amion  If 7PM-7AM, please contact night-coverage per Amion 01/29/2018, 3:34 PM

## 2018-01-29 NOTE — Progress Notes (Signed)
  Echocardiogram 2D Echocardiogram has been performed.  Jennette Dubin 01/29/2018, 4:46 PM

## 2018-01-30 DIAGNOSIS — E785 Hyperlipidemia, unspecified: Secondary | ICD-10-CM

## 2018-01-30 DIAGNOSIS — E876 Hypokalemia: Secondary | ICD-10-CM

## 2018-01-30 LAB — CBC
HEMATOCRIT: 39 % (ref 39.0–52.0)
HEMOGLOBIN: 12.4 g/dL — AB (ref 13.0–17.0)
MCH: 29.8 pg (ref 26.0–34.0)
MCHC: 31.8 g/dL (ref 30.0–36.0)
MCV: 93.8 fL (ref 78.0–100.0)
Platelets: 144 10*3/uL — ABNORMAL LOW (ref 150–400)
RBC: 4.16 MIL/uL — ABNORMAL LOW (ref 4.22–5.81)
RDW: 14.5 % (ref 11.5–15.5)
WBC: 5.1 10*3/uL (ref 4.0–10.5)

## 2018-01-30 LAB — GLUCOSE, CAPILLARY
GLUCOSE-CAPILLARY: 132 mg/dL — AB (ref 70–99)
GLUCOSE-CAPILLARY: 137 mg/dL — AB (ref 70–99)
GLUCOSE-CAPILLARY: 142 mg/dL — AB (ref 70–99)
Glucose-Capillary: 99 mg/dL (ref 70–99)

## 2018-01-30 LAB — MAGNESIUM: Magnesium: 1.8 mg/dL (ref 1.7–2.4)

## 2018-01-30 LAB — BASIC METABOLIC PANEL
ANION GAP: 10 (ref 5–15)
BUN: 11 mg/dL (ref 8–23)
CO2: 28 mmol/L (ref 22–32)
Calcium: 9.9 mg/dL (ref 8.9–10.3)
Chloride: 104 mmol/L (ref 98–111)
Creatinine, Ser: 0.82 mg/dL (ref 0.61–1.24)
GFR calc Af Amer: >60 mL/min (ref >60)
GFR calc non Af Amer: >60 mL/min (ref >60)
GLUCOSE: 115 mg/dL — AB (ref 70–99)
POTASSIUM: 3.3 mmol/L — AB (ref 3.5–5.1)
Sodium: 142 mmol/L (ref 135–145)

## 2018-01-30 MED ORDER — FUROSEMIDE 40 MG PO TABS
40.0000 mg | ORAL_TABLET | Freq: Two times a day (BID) | ORAL | Status: DC
  Administered 2018-01-31: 40 mg via ORAL
  Filled 2018-01-30: qty 1

## 2018-01-30 NOTE — Progress Notes (Signed)
[facility]  PROGRESS NOTE  Francis Cardenas KLK:[id number] DOB: 08-03-1950 DOA: 01/28/2018 PCP: System, Provider Not In   Brief HPI:   68 year old male with a history of prostate cancer, type 2 diabetes mellitus, hypertension, hyperlipidemia who came to hospital with acute shortness of breath.  Initial O2 sats was in the 40s patient was put on BiPAP and brought to the ED.  In the hospital he was started on IV Lasix for acute on chronic diastolic CHF    Subjective   Patient seen and examined, breathing is improved.  Still says he has to catch his breath on ambulation.   Assessment/Plan:     1. Acute on chronic diastolic CHF--patient started on IV Lasix 40 mg every 12 hours, diuresed ~7.2 L since admit.  Will continue with IV Lasix for 1 more day.  And change to Lasix 40 mg p.o. twice daily at the time of discharge.  Echocardiogram shows grade 2 diastolic dysfunction.  2. Mild elevation of troponin-- patient had mild elevation of troponin 0 0.21, 0.17, 0.11.  Likely from demand ischemia due to CHF exacerbation.  No signs and symptoms of ACS.  EKG was unremarkable.  Will benefit from outpatient cardiac stress test  3. Hypertension---blood pressure is mildly elevated, continue Coreg, lisinopril, Norvasc  4. Diabetes mellitus--- blood glucose is controlled, continue sliding scale insulin NovoLog.  Metformin is on hold.  5. Hyperlipidemia--- continue Lipitor, most  6. Alcohol abuse--- continue CIWA, no signs and symptoms of alcohol withdrawal  7. Hypokalemia---potassium is 3.3, will replace potassium and check BMP in a.m.     CBG: Recent Labs  Lab 01/29/18 0633 01/29/18 1122 01/29/18 1702 01/29/18 2148 01/30/18 0632  GLUCAP 125* 104* 107* 124* 132*    CBC: Recent Labs  Lab 01/28/18 0803 01/30/18 0407  WBC 12.1* 5.1  NEUTROABS 10.4*  --   HGB 14.3 12.4*  HCT 45.5 39.0  MCV 95.4 93.8  PLT 169 144*    Basic Metabolic Panel: Recent Labs  Lab 01/28/18 0803  01/29/18 0313 01/30/18 0407  NA 142 143 142  K 3.9 3.1* 3.3*  CL 105 106 104  CO2 21* 27 28  GLUCOSE 222* 111* 115*  BUN 9 10 11   CREATININE 0.89 0.80 0.82  CALCIUM 10.3 9.7 9.9  MG  --   --  1.8     DVT prophylaxis: Lovenox  Code Status: Full code  Family Communication: No family at bedside  Disposition Plan: likely home in am   Consultants:  None  Procedures:  None   Antibiotics:   Anti-infectives (From admission, onward)   None       Objective   Vitals:   01/30/18 0500 01/30/18 0515 01/30/18 0853 01/30/18 0921  BP:  (!) 150/69  (!) 145/57  Pulse:  65  68  Resp:  18  18  Temp:  98.2 F (36.8 C)    TempSrc:  Oral    SpO2:  100% 97% 99%  Weight: 122.9 kg     Height:        Intake/Output Summary (Last 24 hours) at 01/30/2018 1020 Last data filed at 01/30/2018 0921 Gross per 24 hour  Intake 240 ml  Output 3155 ml  Net -2915 ml   Filed Weights   01/28/18 1300 01/29/18 0459 01/30/18 0500  Weight: 123.3 kg 122.4 kg 122.9 kg     Physical Examination:    General: Appears in no acute distress  Cardiovascular: S1-S2, regular, no murmurs auscultated  Respiratory: Decreased breath  sounds bilaterally at lung bases  Abdomen: Soft, nontender, no organomegaly  Extremities: No edema of the lower extremities  Neurologic: Alert, oriented x3, no focal deficit noted     Data Reviewed: I have personally reviewed following labs and imaging studies   No results found for this or any previous visit (from the past 240 hour(s)).   Liver Function Tests: No results for input(s): AST, ALT, ALKPHOS, BILITOT, PROT, ALBUMIN in the last 168 hours. No results for input(s): LIPASE, AMYLASE in the last 168 hours. No results for input(s): AMMONIA in the last 168 hours.  Cardiac Enzymes: Recent Labs  Lab 01/28/18 1416 01/28/18 2041 01/29/18 0313  TROPONINI 0.21* 0.17* 0.11*   BNP (last 3 results) Recent Labs    12/30/17 0448 01/28/18 0804  BNP  258.5* 562.9*    ProBNP (last 3 results) No results for input(s): PROBNP in the last 8760 hours.    Studies: No results found.  Scheduled Meds: . amLODipine  10 mg Oral Daily  . aspirin EC  81 mg Oral Daily  . atorvastatin  40 mg Oral QPM  . carvedilol  25 mg Oral BID WC  . enoxaparin (LOVENOX) injection  40 mg Subcutaneous Q24H  . furosemide  40 mg Intravenous Q12H  . gabapentin  300 mg Oral TID  . lisinopril  40 mg Oral Daily  . mometasone-formoterol  2 puff Inhalation BID  . multivitamin with minerals  1 tablet Oral Daily  . pantoprazole  40 mg Oral Daily  . potassium chloride  40 mEq Oral BID  . sertraline  150 mg Oral Daily  . vitamin B-12  1,000 mcg Oral Daily      Time spent: 25 min  [facility] Pager [phone]. If 7PM-7AM, please contact night-coverage at [email or web address], Office  [phone]  password [email or web address]  01/30/2018, 10:20 AM  LOS: 2 days

## 2018-01-31 DIAGNOSIS — I5023 Acute on chronic systolic (congestive) heart failure: Secondary | ICD-10-CM

## 2018-01-31 DIAGNOSIS — I5033 Acute on chronic diastolic (congestive) heart failure: Secondary | ICD-10-CM

## 2018-01-31 DIAGNOSIS — Z6837 Body mass index (BMI) 37.0-37.9, adult: Secondary | ICD-10-CM

## 2018-01-31 DIAGNOSIS — F1010 Alcohol abuse, uncomplicated: Secondary | ICD-10-CM

## 2018-01-31 DIAGNOSIS — E1143 Type 2 diabetes mellitus with diabetic autonomic (poly)neuropathy: Secondary | ICD-10-CM

## 2018-01-31 DIAGNOSIS — J9601 Acute respiratory failure with hypoxia: Secondary | ICD-10-CM

## 2018-01-31 DIAGNOSIS — E1169 Type 2 diabetes mellitus with other specified complication: Secondary | ICD-10-CM

## 2018-01-31 LAB — GLUCOSE, CAPILLARY
Glucose-Capillary: 117 mg/dL — ABNORMAL HIGH (ref 70–99)
Glucose-Capillary: 131 mg/dL — ABNORMAL HIGH (ref 70–99)

## 2018-01-31 LAB — BASIC METABOLIC PANEL
Anion gap: 10 (ref 5–15)
BUN: 12 mg/dL (ref 8–23)
CHLORIDE: 104 mmol/L (ref 98–111)
CO2: 27 mmol/L (ref 22–32)
Calcium: 9.9 mg/dL (ref 8.9–10.3)
Creatinine, Ser: 0.87 mg/dL (ref 0.61–1.24)
GFR calc non Af Amer: >60 mL/min (ref >60)
GLUCOSE: 120 mg/dL — AB (ref 70–99)
Potassium: 3.7 mmol/L (ref 3.5–5.1)
Sodium: 141 mmol/L (ref 135–145)

## 2018-01-31 MED ORDER — FUROSEMIDE 40 MG PO TABS: 40.0000 mg | ORAL_TABLET | Freq: Two times a day (BID) | ORAL | 2 refills | Status: DC

## 2018-01-31 MED ORDER — MAGNESIUM OXIDE 400 MG PO TABS: 400.0000 mg | ORAL_TABLET | Freq: Every day | ORAL | 1 refills | Status: DC

## 2018-01-31 MED ORDER — POTASSIUM CHLORIDE CRYS ER 20 MEQ PO TBCR: 40.0000 meq | EXTENDED_RELEASE_TABLET | Freq: Every day | ORAL | 1 refills | Status: DC

## 2018-01-31 NOTE — Progress Notes (Signed)
Pt discharging home with wife. IV and telemetry box removed. Pt received discharge instructions and all questions were answered. Pt has all belongings packed. Pt will be transported down to car by wheelchair.   Ara Kussmaul BSN, RN

## 2018-01-31 NOTE — Care Management Important Message (Signed)
Important Message  Patient Details  Name: Francis Cardenas MRN: [id number] Date of Birth: Oct 14, 1950   Medicare Important Message Given:  Yes    Barb Merino Cormick Moss 01/31/2018, 2:32 PM

## 2018-01-31 NOTE — Discharge Summary (Signed)
Physician Discharge Summary  Francis Cardenas [name] DOB: May 07, 1951 DOA: 01/28/2018  PCP: System, Provider Not In  Admit date: 01/28/2018 Discharge date: 01/31/2018  Time spent: 35 minutes  Recommendations for Outpatient Follow-up:  1. Repeat BMET to follow electrolytes and renal function 2. Reassess BP and adjust antihypertensive regimen as needed  3. Patient needs assistance with alcohol abuse 4. Follow CBG's and adjust hypoglycemic regimen as needed    Discharge Diagnoses:  Acute on chronic diastolic CHF (congestive heart failure) (HCC) Class 2 severe obesity due to excess calories with serious comorbidity and body mass index (BMI) of 37.0 to 37.9 in adult [facility]) Alcohol abuse Essential HTN Depression  GERD OSA  Discharge Condition: stable and improved. Discharge home with instructions to follow up with PCP in 2 weeks.  Diet recommendation: heart healthy and modified carb diet   Filed Weights   01/29/18 0459 01/30/18 0500 01/31/18 0537  Weight: 122.4 kg 122.9 kg 123.3 kg    History of present illness:  As per H&P written by Dr. Kyung Bacca 67 y.o. male with medical history significant for prostate cancer type 2 diabetes mellitus hypertension hyperlipidemia.Pt presents to the ED today with sob. Most hx obtained from EMS as he is extremely sob. The sx started this morning suddenly. He tried his inhaler which did not help. He called EMS when breathing worsened. His initial RA O2 sats were in the 40s. EMS put him directly on cpap and brought him here. He was hypertensive, so he was given 1 SL nitro as well. Breathing has improved en route, but he is still very sob. He was admitted from 8/10 to 8/16 for CHF exacerbation. He required bipap initially during that admission. Pt said he continues to drink 18 beers/day. He has doubled his lasix a few times since Wednesday, the [facility] Course:  1-acute on chronic diastolic HF -most likely from diet indiscretion  -troponin  neg -Echo demonstrating preserved EF and grade 2 diastolic HF -patient discharge on lasix 40mg  BID -continue b-blocker and lisinopril  -outpatient follow up with heart failure clinic -instructed to follow daily weight and to follow low sodium diet  -no oxygen supplementation needed at discharge   2-alcohol abuse -no withdrawal appreciated -cessation counseling provided -CIWA protocol use during hospitalization -patient discharge on multivitamins to supplement thiamine and folic acid.  3-OSA -resume CPAP QHS  4-morbid obesity: class 2 due to excess calorie -Body mass index is 37.91 kg/m. -low calorie diet, portion control and physical activity discussed with patient   5-GERD -continue PPI  6-type 2 diabetes mellitus with neuropathy  -continue oral hypoglycemic regimen and neurontin -outpatient follow up of his CBG's and A1C with further adjustment to hypoglycemic regimen as needed.  7-depression -continue zoloft -no SI or hallucinations.    Procedures:  2-D echo - Left ventricle: The cavity size was moderately dilated. Systolic   function was normal. The estimated ejection fraction was in the   range of 55% to 60%. Wall motion was normal; there were no   regional wall motion abnormalities. Features are consistent with   a pseudonormal left ventricular filling pattern, with concomitant   abnormal relaxation and increased filling pressure (grade 2   diastolic dysfunction). Doppler parameters are consistent with   high ventricular filling pressure. - Aortic valve: Transvalvular velocity was within the normal range.   There was no stenosis. There was no regurgitation. - Mitral valve: Transvalvular velocity was within the normal range.   There was no evidence for stenosis. There  was mild regurgitation. - Left atrium: The atrium was mildly dilated. - Right ventricle: The cavity size was normal. Wall thickness was   normal. Systolic function was normal. - Tricuspid valve:  There was mild regurgitation. - Pulmonary arteries: Systolic pressure was within the normal   range. PA peak pressure: 38 mm Hg (S).   Consultations:  None   Discharge Exam: Vitals:   01/31/18 0829 01/31/18 0908  BP: (!) 151/66   Pulse: 62   Resp: 17   Temp:    SpO2:  93%    General:afebrile, no CP, no nausea, no vomiting and with breathing back to normal. No orthopnea and with good O2 sat on RA. Cardiovascular: S1 and S2, no rubs, no gallops Respiratory: no wheezing, improved air movement, no crackles appreciated on exam. Abd: soft, NT, ND, obese, positive BS Extremities: trace pedal edema appreciated, no cyanosis, no clubbing.  Discharge Instructions   Discharge Instructions    (HEART FAILURE PATIENTS) Call MD:  Anytime you have any of the following symptoms: 1) 3 pound weight gain in 24 hours or 5 pounds in 1 week 2) shortness of breath, with or without a dry hacking cough 3) swelling in the hands, feet or stomach 4) if you have to sleep on extra pillows at night in order to breathe.   Complete by:  As directed    Amb Referral to HF Clinic   Complete by:  As directed    Diet - low sodium heart healthy   Complete by:  As directed    Diet Carb Modified   Complete by:  As directed    Discharge instructions   Complete by:  As directed    Take medications as prescribed  Follow low sodium diet (maximun of 2000 mg of sodium daily) Check weight on daily basis Follow modified carb diet Stop alcohol consumption Maintain adequate hydration.     Allergies as of 01/31/2018   No Known Allergies     Medication List    STOP taking these medications   naproxen sodium 220 MG tablet Commonly known as:  ALEVE     TAKE these medications   albuterol 108 (90 Base) MCG/ACT inhaler Commonly known as:  PROVENTIL HFA;VENTOLIN HFA Inhale 2 puffs into the lungs every 6 (six) hours as needed for wheezing.   amLODipine 10 MG tablet Commonly known as:  NORVASC Take 10 mg by mouth  daily.   aspirin EC 81 MG tablet Take 81 mg by mouth daily.   atorvastatin 40 MG tablet Commonly known as:  LIPITOR Take 40 mg by mouth every evening.   budesonide-formoterol 80-4.5 MCG/ACT inhaler Commonly known as:  SYMBICORT Inhale 2 puffs into the lungs 2 (two) times daily.   carvedilol 25 MG tablet Commonly known as:  COREG Take 1 tablet (25 mg total) by mouth 2 (two) times daily with a meal. For hypertension.   cholecalciferol 1000 units tablet Commonly known as:  VITAMIN D Take 1,000 Units by mouth daily.   docusate sodium 100 MG capsule Commonly known as:  COLACE Take 100 mg by mouth daily as needed for mild constipation.   furosemide 40 MG tablet Commonly known as:  LASIX Take 1 tablet (40 mg total) by mouth 2 (two) times daily. What changed:  when to take this   gabapentin 300 MG capsule Commonly known as:  NEURONTIN Take 300 mg by mouth 3 (three) times daily.   lisinopril 40 MG tablet Commonly known as:  PRINIVIL,ZESTRIL Take 1 tablet (40  mg total) by mouth daily. For hypertension.   magnesium oxide 400 MG tablet Commonly known as:  MAG-OX Take 1 tablet (400 mg total) by mouth daily.   metFORMIN 500 MG tablet Commonly known as:  GLUCOPHAGE Take 500 mg by mouth daily with breakfast.   Misc. Devices Misc by Does not apply route. C-PAP   multivitamin with minerals tablet Take 1 tablet by mouth daily.   omeprazole 40 MG capsule Commonly known as:  PRILOSEC Take 40 mg by mouth every evening.   potassium chloride SA 20 MEQ tablet Commonly known as:  K-DUR,KLOR-CON Take 2 tablets (40 mEq total) by mouth daily.   sertraline 100 MG tablet Commonly known as:  ZOLOFT Take one pill by mouth each day for depression and anxiety. What changed:    how much to take  how to take this  when to take this  additional instructions   vitamin B-12 1000 MCG tablet Commonly known as:  CYANOCOBALAMIN Take 1,000 mcg by mouth daily.      No Known  Allergies   The results of significant diagnostics from this hospitalization (including imaging, microbiology, ancillary and laboratory) are listed below for reference.    Significant Diagnostic Studies: Dg Chest Port 1 View  Result Date: 01/28/2018 CLINICAL DATA:  Respiratory distress starting at 4 a.m. Hypoxia. Diaphoresis. EXAM: PORTABLE CHEST 1 VIEW COMPARISON:  01/03/2018 FINDINGS: Abnormal interstitial accentuation with Kerley B lines. Mild cardiomegaly. Faint ground-glass opacities in the lung bases, right greater than left. Mild thickening of the minor fissure. Subtle blunting of the left lateral costophrenic angle. IMPRESSION: 1. Acute pulmonary edema with mild cardiomegaly, Kerley B lines, and early airspace edema mildly asymmetric towards the right lung base. 2. Trace left pleural effusion. Electronically Signed   By: Van Clines M.D.   On: 01/28/2018 08:40   Dg Chest Port 1 View  Result Date: 01/03/2018 CLINICAL DATA:  Shortness of breath. EXAM: PORTABLE CHEST 1 VIEW COMPARISON:  Radiograph of December 30, 2017. FINDINGS: The heart size and mediastinal contours are within normal limits. Both lungs are clear. No pneumothorax or pleural effusion is noted. The visualized skeletal structures are unremarkable. IMPRESSION: No acute cardiopulmonary abnormality seen. Electronically Signed   By: Marijo Conception, M.D.   On: 01/03/2018 09:53   Dg Fluoro Guided Needle Plc Aspiration/injection Loc  Result Date: 01/03/2018 CLINICAL DATA:  Left elbow pain with swelling and redness. EXAM: LEFT ELBOW OLECRANON BURSA ASPIRATION  UNDER FLUOROSCOPY FLUOROSCOPY TIME:  Fluoroscopy Time:  0 minutes 42 second Radiation Exposure Index (if provided by the fluoroscopic device): Number of Acquired Spot Images: 0 PROCEDURE: The olecranon bursa is moderately swollen and erythematous and painful compatible with olecranon bursitis. Informed written consent was obtained. Patient signed a consent form. A time-out  procedure was performed. Overlying skin prepped with Betadine, draped in the usual sterile fashion, and infiltrated locally with buffered Lidocaine. The olecranon bursa was aspirated with a 21 gauge needle. Small amount of bloody fluid was obtained and sent for laboratory studies including culture. Additional aspirations were also performed however I did not encounter a focal fluid collection. IMPRESSION: Findings consistent with olecranon bursitis. The olecranon bursa was aspirated with small amount of bloody fluid sent to the laboratory. Patient tolerated procedure well. Electronically Signed   By: Franchot Gallo M.D.   On: 01/03/2018 14:20   Labs: Basic Metabolic Panel: Recent Labs  Lab 01/28/18 0803 01/29/18 0313 01/30/18 0407 01/31/18 0324  NA 142 143 142 141  K 3.9 3.1*  3.3* 3.7  CL 105 106 104 104  CO2 21* 27 28 27   GLUCOSE 222* 111* 115* 120*  BUN 9 10 11 12   CREATININE 0.89 0.80 0.82 0.87  CALCIUM 10.3 9.7 9.9 9.9  MG  --   --  1.8  --    CBC: Recent Labs  Lab 01/28/18 0803 01/30/18 0407  WBC 12.1* 5.1  NEUTROABS 10.4*  --   HGB 14.3 12.4*  HCT 45.5 39.0  MCV 95.4 93.8  PLT 169 144*   Cardiac Enzymes: Recent Labs  Lab 01/28/18 1416 01/28/18 2041 01/29/18 0313  TROPONINI 0.21* 0.17* 0.11*   BNP: BNP (last 3 results) Recent Labs    12/30/17 0448 01/28/18 0804  BNP 258.5* 562.9*    CBG: Recent Labs  Lab 01/30/18 1141 01/30/18 1617 01/30/18 2122 01/31/18 0640 01/31/18 1120  GLUCAP 99 137* 142* 117* 131*    Signed:  Barton Dubois MD.  [facility] 01/31/2018, 2:49 PM

## 2018-02-01 NOTE — Progress Notes (Signed)
Post discharge note- received call from April at [facility]- pt has primary care at [facility] clinic with [place]. CSW at the [place] clinic is Glean Hess contact # is [phone] ext. [phone] or pager [phone].

## 2018-10-05 ENCOUNTER — Other Ambulatory Visit: Payer: Self-pay | Admitting: Family Medicine

## 2018-10-05 ENCOUNTER — Ambulatory Visit
Admission: RE | Admit: 2018-10-05 | Discharge: 2018-10-05 | Disposition: A | Discharge status: Home / Self Care | Payer: Non-veteran care | Source: Ambulatory Visit | Attending: Family Medicine | Admitting: Family Medicine

## 2018-10-05 ENCOUNTER — Other Ambulatory Visit: Payer: Self-pay

## 2018-10-05 DIAGNOSIS — M79642 Pain in left hand: Secondary | ICD-10-CM

## 2018-12-10 ENCOUNTER — Other Ambulatory Visit: Payer: Self-pay

## 2018-12-10 ENCOUNTER — Emergency Department (HOSPITAL_COMMUNITY): Payer: Medicare HMO

## 2018-12-10 ENCOUNTER — Encounter (HOSPITAL_COMMUNITY): Payer: Self-pay | Admitting: Emergency Medicine

## 2018-12-10 ENCOUNTER — Inpatient Hospital Stay (HOSPITAL_COMMUNITY)
Admission: EM | Admit: 2018-12-10 | Discharge: 2018-12-12 | DRG: 189 | Disposition: A | Discharge status: Home Health | Payer: Medicare HMO | Attending: Internal Medicine | Admitting: Internal Medicine

## 2018-12-10 DIAGNOSIS — T380X5A Adverse effect of glucocorticoids and synthetic analogues, initial encounter: Secondary | ICD-10-CM | POA: Diagnosis present

## 2018-12-10 DIAGNOSIS — J9621 Acute and chronic respiratory failure with hypoxia: Secondary | ICD-10-CM | POA: Diagnosis not present

## 2018-12-10 DIAGNOSIS — I5032 Chronic diastolic (congestive) heart failure: Secondary | ICD-10-CM | POA: Diagnosis present

## 2018-12-10 DIAGNOSIS — W1789XA Other fall from one level to another, initial encounter: Secondary | ICD-10-CM | POA: Diagnosis present

## 2018-12-10 DIAGNOSIS — J9601 Acute respiratory failure with hypoxia: Secondary | ICD-10-CM | POA: Diagnosis present

## 2018-12-10 DIAGNOSIS — E1169 Type 2 diabetes mellitus with other specified complication: Secondary | ICD-10-CM | POA: Diagnosis present

## 2018-12-10 DIAGNOSIS — F1010 Alcohol abuse, uncomplicated: Secondary | ICD-10-CM | POA: Diagnosis not present

## 2018-12-10 DIAGNOSIS — I10 Essential (primary) hypertension: Secondary | ICD-10-CM | POA: Diagnosis not present

## 2018-12-10 DIAGNOSIS — Z9989 Dependence on other enabling machines and devices: Secondary | ICD-10-CM

## 2018-12-10 DIAGNOSIS — I110 Hypertensive heart disease with heart failure: Secondary | ICD-10-CM | POA: Diagnosis present

## 2018-12-10 DIAGNOSIS — J441 Chronic obstructive pulmonary disease with (acute) exacerbation: Secondary | ICD-10-CM

## 2018-12-10 DIAGNOSIS — Z6839 Body mass index (BMI) 39.0-39.9, adult: Secondary | ICD-10-CM

## 2018-12-10 DIAGNOSIS — K219 Gastro-esophageal reflux disease without esophagitis: Secondary | ICD-10-CM | POA: Diagnosis present

## 2018-12-10 DIAGNOSIS — I5030 Unspecified diastolic (congestive) heart failure: Secondary | ICD-10-CM | POA: Diagnosis present

## 2018-12-10 DIAGNOSIS — N400 Enlarged prostate without lower urinary tract symptoms: Secondary | ICD-10-CM | POA: Diagnosis present

## 2018-12-10 DIAGNOSIS — Z7951 Long term (current) use of inhaled steroids: Secondary | ICD-10-CM

## 2018-12-10 DIAGNOSIS — Z8546 Personal history of malignant neoplasm of prostate: Secondary | ICD-10-CM

## 2018-12-10 DIAGNOSIS — J9600 Acute respiratory failure, unspecified whether with hypoxia or hypercapnia: Secondary | ICD-10-CM | POA: Diagnosis present

## 2018-12-10 DIAGNOSIS — Z20828 Contact with and (suspected) exposure to other viral communicable diseases: Secondary | ICD-10-CM | POA: Diagnosis present

## 2018-12-10 DIAGNOSIS — E876 Hypokalemia: Secondary | ICD-10-CM | POA: Diagnosis present

## 2018-12-10 DIAGNOSIS — F418 Other specified anxiety disorders: Secondary | ICD-10-CM | POA: Diagnosis present

## 2018-12-10 DIAGNOSIS — S2249XA Multiple fractures of ribs, unspecified side, initial encounter for closed fracture: Secondary | ICD-10-CM | POA: Diagnosis present

## 2018-12-10 DIAGNOSIS — W19XXXA Unspecified fall, initial encounter: Secondary | ICD-10-CM | POA: Diagnosis present

## 2018-12-10 DIAGNOSIS — Y92008 Other place in unspecified non-institutional (private) residence as the place of occurrence of the external cause: Secondary | ICD-10-CM

## 2018-12-10 DIAGNOSIS — Z9181 History of falling: Secondary | ICD-10-CM

## 2018-12-10 DIAGNOSIS — E1165 Type 2 diabetes mellitus with hyperglycemia: Secondary | ICD-10-CM | POA: Diagnosis present

## 2018-12-10 DIAGNOSIS — Z87891 Personal history of nicotine dependence: Secondary | ICD-10-CM

## 2018-12-10 DIAGNOSIS — Z7982 Long term (current) use of aspirin: Secondary | ICD-10-CM

## 2018-12-10 DIAGNOSIS — E119 Type 2 diabetes mellitus without complications: Secondary | ICD-10-CM | POA: Diagnosis present

## 2018-12-10 DIAGNOSIS — G4733 Obstructive sleep apnea (adult) (pediatric): Secondary | ICD-10-CM

## 2018-12-10 DIAGNOSIS — E669 Obesity, unspecified: Secondary | ICD-10-CM | POA: Diagnosis present

## 2018-12-10 DIAGNOSIS — Z923 Personal history of irradiation: Secondary | ICD-10-CM

## 2018-12-10 DIAGNOSIS — Z79899 Other long term (current) drug therapy: Secondary | ICD-10-CM

## 2018-12-10 DIAGNOSIS — E785 Hyperlipidemia, unspecified: Secondary | ICD-10-CM | POA: Diagnosis present

## 2018-12-10 DIAGNOSIS — S2241XA Multiple fractures of ribs, right side, initial encounter for closed fracture: Secondary | ICD-10-CM

## 2018-12-10 HISTORY — DX: Chronic obstructive pulmonary disease, unspecified: J44.9

## 2018-12-10 LAB — POCT I-STAT EG7
Acid-base deficit: 1 mmol/L (ref 0.0–2.0)
Bicarbonate: 25.4 mmol/L (ref 20.0–28.0)
Calcium, Ion: 1.31 mmol/L (ref 1.15–1.40)
HCT: 42 % (ref 39.0–52.0)
Hemoglobin: 14.3 g/dL (ref 13.0–17.0)
O2 Saturation: 87 %
Potassium: 3.4 mmol/L — ABNORMAL LOW (ref 3.5–5.1)
Sodium: 137 mmol/L (ref 135–145)
TCO2: 27 mmol/L (ref 22–32)
pCO2, Ven: 45.6 mmHg (ref 44.0–60.0)
pH, Ven: 7.353 (ref 7.250–7.430)
pO2, Ven: 55 mmHg — ABNORMAL HIGH (ref 32.0–45.0)

## 2018-12-10 LAB — CBC WITH DIFFERENTIAL/PLATELET
Abs Immature Granulocytes: 0.03 10*3/uL (ref 0.00–0.07)
Basophils Absolute: 0 10*3/uL (ref 0.0–0.1)
Basophils Relative: 1 %
Eosinophils Absolute: 0.3 10*3/uL (ref 0.0–0.5)
Eosinophils Relative: 6 %
HCT: 41.9 % (ref 39.0–52.0)
Hemoglobin: 14.4 g/dL (ref 13.0–17.0)
Immature Granulocytes: 1 %
Lymphocytes Relative: 19 %
Lymphs Abs: 1.1 10*3/uL (ref 0.7–4.0)
MCH: 31.9 pg (ref 26.0–34.0)
MCHC: 34.4 g/dL (ref 30.0–36.0)
MCV: 92.9 fL (ref 80.0–100.0)
Monocytes Absolute: 0.4 10*3/uL (ref 0.1–1.0)
Monocytes Relative: 7 %
Neutro Abs: 3.8 10*3/uL (ref 1.7–7.7)
Neutrophils Relative %: 66 %
Platelets: 149 10*3/uL — ABNORMAL LOW (ref 150–400)
RBC: 4.51 MIL/uL (ref 4.22–5.81)
RDW: 14.6 % (ref 11.5–15.5)
WBC: 5.7 10*3/uL (ref 4.0–10.5)
nRBC: 0 % (ref 0.0–0.2)

## 2018-12-10 LAB — URINALYSIS, ROUTINE W REFLEX MICROSCOPIC
Bilirubin Urine: NEGATIVE
Glucose, UA: NEGATIVE mg/dL
Hgb urine dipstick: NEGATIVE
Ketones, ur: NEGATIVE mg/dL
Leukocytes,Ua: NEGATIVE
Nitrite: NEGATIVE
Protein, ur: NEGATIVE mg/dL
Specific Gravity, Urine: 1.002 — ABNORMAL LOW (ref 1.005–1.030)
pH: 6 (ref 5.0–8.0)

## 2018-12-10 LAB — COMPREHENSIVE METABOLIC PANEL
ALT: 48 U/L — ABNORMAL HIGH (ref 0–44)
AST: 48 U/L — ABNORMAL HIGH (ref 15–41)
Albumin: 4.3 g/dL (ref 3.5–5.0)
Alkaline Phosphatase: 83 U/L (ref 38–126)
Anion gap: 12 (ref 5–15)
BUN: 6 mg/dL — ABNORMAL LOW (ref 8–23)
CO2: 23 mmol/L (ref 22–32)
Calcium: 10.3 mg/dL (ref 8.9–10.3)
Chloride: 102 mmol/L (ref 98–111)
Creatinine, Ser: 0.75 mg/dL (ref 0.61–1.24)
GFR calc Af Amer: >60 mL/min (ref >60)
GFR calc non Af Amer: >60 mL/min (ref >60)
Glucose, Bld: 90 mg/dL (ref 70–99)
Potassium: 3.4 mmol/L — ABNORMAL LOW (ref 3.5–5.1)
Sodium: 137 mmol/L (ref 135–145)
Total Bilirubin: 0.7 mg/dL (ref 0.3–1.2)
Total Protein: 7.1 g/dL (ref 6.5–8.1)

## 2018-12-10 LAB — SARS CORONAVIRUS 2 BY RT PCR (HOSPITAL ORDER, PERFORMED IN ~~LOC~~ HOSPITAL LAB): SARS Coronavirus 2: NEGATIVE

## 2018-12-10 LAB — BRAIN NATRIURETIC PEPTIDE: B Natriuretic Peptide: 144.9 pg/mL — ABNORMAL HIGH (ref 0.0–100.0)

## 2018-12-10 MED ORDER — METHYLPREDNISOLONE SODIUM SUCC 125 MG IJ SOLR
60.0000 mg | Freq: Three times a day (TID) | INTRAMUSCULAR | Status: DC
  Administered 2018-12-11 – 2018-12-12 (×5): 60 mg via INTRAVENOUS
  Filled 2018-12-10 (×5): qty 2

## 2018-12-10 MED ORDER — GABAPENTIN 300 MG PO CAPS
300.0000 mg | ORAL_CAPSULE | Freq: Three times a day (TID) | ORAL | Status: DC
  Administered 2018-12-11 – 2018-12-12 (×5): 300 mg via ORAL
  Filled 2018-12-10 (×5): qty 1

## 2018-12-10 MED ORDER — INSULIN ASPART 100 UNIT/ML ~~LOC~~ SOLN
0.0000 [IU] | Freq: Every day | SUBCUTANEOUS | Status: DC
  Administered 2018-12-11: 2 [IU] via SUBCUTANEOUS

## 2018-12-10 MED ORDER — VITAMIN D 25 MCG (1000 UNIT) PO TABS
1000.0000 [IU] | ORAL_TABLET | Freq: Every day | ORAL | Status: DC
  Administered 2018-12-11: 1000 [IU] via ORAL
  Filled 2018-12-10: qty 1

## 2018-12-10 MED ORDER — ALPRAZOLAM 0.5 MG PO TABS
1.0000 mg | ORAL_TABLET | Freq: Every day | ORAL | Status: DC
  Administered 2018-12-11 (×2): 1 mg via ORAL
  Filled 2018-12-10 (×2): qty 2

## 2018-12-10 MED ORDER — ASPIRIN EC 81 MG PO TBEC
81.0000 mg | DELAYED_RELEASE_TABLET | Freq: Every day | ORAL | Status: DC
  Administered 2018-12-11 – 2018-12-12 (×2): 81 mg via ORAL
  Filled 2018-12-10 (×2): qty 1

## 2018-12-10 MED ORDER — VITAMIN B-1 100 MG PO TABS
100.0000 mg | ORAL_TABLET | Freq: Every day | ORAL | Status: DC
  Administered 2018-12-11 – 2018-12-12 (×2): 100 mg via ORAL
  Filled 2018-12-10 (×2): qty 1

## 2018-12-10 MED ORDER — METHYLPREDNISOLONE SODIUM SUCC 125 MG IJ SOLR
125.0000 mg | Freq: Once | INTRAMUSCULAR | Status: AC
  Administered 2018-12-10: 125 mg via INTRAVENOUS
  Filled 2018-12-10: qty 2

## 2018-12-10 MED ORDER — DICLOFENAC SODIUM 1 % TD GEL: 1.0000 "application " | Freq: Two times a day (BID) | TRANSDERMAL | Status: DC | PRN

## 2018-12-10 MED ORDER — SERTRALINE HCL 50 MG PO TABS
150.0000 mg | ORAL_TABLET | Freq: Every day | ORAL | Status: DC
  Administered 2018-12-11 – 2018-12-12 (×2): 150 mg via ORAL
  Filled 2018-12-10 (×2): qty 1

## 2018-12-10 MED ORDER — ACETAMINOPHEN 325 MG PO TABS: 650.0000 mg | ORAL_TABLET | Freq: Four times a day (QID) | ORAL | Status: DC | PRN

## 2018-12-10 MED ORDER — LORAZEPAM 1 MG PO TABS: 1.0000 mg | ORAL_TABLET | Freq: Four times a day (QID) | ORAL | Status: DC | PRN

## 2018-12-10 MED ORDER — AZITHROMYCIN 250 MG PO TABS
250.0000 mg | ORAL_TABLET | Freq: Every day | ORAL | Status: DC
  Administered 2018-12-11: 250 mg via ORAL
  Filled 2018-12-10: qty 1

## 2018-12-10 MED ORDER — ALBUTEROL SULFATE (2.5 MG/3ML) 0.083% IN NEBU: 2.5000 mg | INHALATION_SOLUTION | RESPIRATORY_TRACT | Status: DC | PRN

## 2018-12-10 MED ORDER — LORAZEPAM 2 MG/ML IJ SOLN: 1.0000 mg | Freq: Four times a day (QID) | INTRAMUSCULAR | Status: DC | PRN

## 2018-12-10 MED ORDER — CARVEDILOL 25 MG PO TABS
25.0000 mg | ORAL_TABLET | Freq: Two times a day (BID) | ORAL | Status: DC
  Administered 2018-12-11 – 2018-12-12 (×3): 25 mg via ORAL
  Filled 2018-12-10 (×3): qty 1

## 2018-12-10 MED ORDER — POLYVINYL ALCOHOL 1.4 % OP SOLN: 1.0000 [drp] | Freq: Four times a day (QID) | OPHTHALMIC | Status: DC | PRN

## 2018-12-10 MED ORDER — ADULT MULTIVITAMIN W/MINERALS CH
1.0000 | ORAL_TABLET | Freq: Every day | ORAL | Status: DC
  Administered 2018-12-11: 1 via ORAL
  Filled 2018-12-10: qty 1

## 2018-12-10 MED ORDER — AMLODIPINE BESYLATE 10 MG PO TABS
10.0000 mg | ORAL_TABLET | Freq: Every day | ORAL | Status: DC
  Administered 2018-12-11 – 2018-12-12 (×2): 10 mg via ORAL
  Filled 2018-12-10 (×3): qty 1

## 2018-12-10 MED ORDER — ADULT MULTIVITAMIN W/MINERALS CH
1.0000 | ORAL_TABLET | Freq: Every day | ORAL | Status: DC
  Administered 2018-12-11 – 2018-12-12 (×2): 1 via ORAL
  Filled 2018-12-10 (×2): qty 1

## 2018-12-10 MED ORDER — ALBUTEROL SULFATE HFA 108 (90 BASE) MCG/ACT IN AERS
4.0000 | INHALATION_SPRAY | Freq: Once | RESPIRATORY_TRACT | Status: AC
  Administered 2018-12-10: 4 via RESPIRATORY_TRACT

## 2018-12-10 MED ORDER — LISINOPRIL 40 MG PO TABS
40.0000 mg | ORAL_TABLET | Freq: Every day | ORAL | Status: DC
  Administered 2018-12-11 – 2018-12-12 (×2): 40 mg via ORAL
  Filled 2018-12-10 (×2): qty 1

## 2018-12-10 MED ORDER — AZITHROMYCIN 250 MG PO TABS
500.0000 mg | ORAL_TABLET | Freq: Every day | ORAL | Status: AC
  Administered 2018-12-11: 500 mg via ORAL
  Filled 2018-12-10: qty 2

## 2018-12-10 MED ORDER — ONDANSETRON HCL 4 MG/2ML IJ SOLN: 4.0000 mg | Freq: Three times a day (TID) | INTRAMUSCULAR | Status: DC | PRN

## 2018-12-10 MED ORDER — PANTOPRAZOLE SODIUM 40 MG PO TBEC
40.0000 mg | DELAYED_RELEASE_TABLET | Freq: Every day | ORAL | Status: DC
  Administered 2018-12-11 – 2018-12-12 (×2): 40 mg via ORAL
  Filled 2018-12-10 (×2): qty 1

## 2018-12-10 MED ORDER — DM-GUAIFENESIN ER 30-600 MG PO TB12: 1.0000 | ORAL_TABLET | Freq: Two times a day (BID) | ORAL | Status: DC | PRN

## 2018-12-10 MED ORDER — POTASSIUM CHLORIDE CRYS ER 20 MEQ PO TBCR
40.0000 meq | EXTENDED_RELEASE_TABLET | Freq: Once | ORAL | Status: AC
  Administered 2018-12-10: 40 meq via ORAL
  Filled 2018-12-10: qty 2

## 2018-12-10 MED ORDER — INSULIN ASPART 100 UNIT/ML ~~LOC~~ SOLN
0.0000 [IU] | Freq: Three times a day (TID) | SUBCUTANEOUS | Status: DC
  Administered 2018-12-11: 1 [IU] via SUBCUTANEOUS
  Administered 2018-12-11 (×2): 3 [IU] via SUBCUTANEOUS
  Administered 2018-12-12 (×2): 2 [IU] via SUBCUTANEOUS

## 2018-12-10 MED ORDER — OXYCODONE-ACETAMINOPHEN 5-325 MG PO TABS
1.0000 | ORAL_TABLET | ORAL | Status: DC | PRN
  Administered 2018-12-11: 1 via ORAL
  Filled 2018-12-10: qty 1

## 2018-12-10 MED ORDER — MAGNESIUM OXIDE 400 (241.3 MG) MG PO TABS
400.0000 mg | ORAL_TABLET | Freq: Every day | ORAL | Status: DC
  Administered 2018-12-11: 400 mg via ORAL
  Filled 2018-12-10: qty 1

## 2018-12-10 MED ORDER — ENOXAPARIN SODIUM 40 MG/0.4ML ~~LOC~~ SOLN
40.0000 mg | Freq: Every day | SUBCUTANEOUS | Status: DC
  Administered 2018-12-11 – 2018-12-12 (×2): 40 mg via SUBCUTANEOUS
  Filled 2018-12-10 (×2): qty 0.4

## 2018-12-10 MED ORDER — HYDRALAZINE HCL 20 MG/ML IJ SOLN: 5.0000 mg | INTRAMUSCULAR | Status: DC | PRN

## 2018-12-10 MED ORDER — MOMETASONE FURO-FORMOTEROL FUM 100-5 MCG/ACT IN AERO
2.0000 | INHALATION_SPRAY | Freq: Two times a day (BID) | RESPIRATORY_TRACT | Status: DC
  Administered 2018-12-11 – 2018-12-12 (×3): 2 via RESPIRATORY_TRACT
  Filled 2018-12-10: qty 8.8

## 2018-12-10 MED ORDER — FOLIC ACID 1 MG PO TABS
1.0000 mg | ORAL_TABLET | Freq: Every day | ORAL | Status: DC
  Administered 2018-12-11 – 2018-12-12 (×2): 1 mg via ORAL
  Filled 2018-12-10 (×2): qty 1

## 2018-12-10 MED ORDER — THIAMINE HCL 100 MG/ML IJ SOLN
100.0000 mg | Freq: Every day | INTRAMUSCULAR | Status: DC
  Filled 2018-12-10: qty 2

## 2018-12-10 MED ORDER — FUROSEMIDE 40 MG PO TABS
40.0000 mg | ORAL_TABLET | Freq: Two times a day (BID) | ORAL | Status: DC
  Administered 2018-12-11 – 2018-12-12 (×3): 40 mg via ORAL
  Filled 2018-12-10 (×3): qty 1

## 2018-12-10 MED ORDER — ALBUTEROL SULFATE HFA 108 (90 BASE) MCG/ACT IN AERS
8.0000 | INHALATION_SPRAY | Freq: Once | RESPIRATORY_TRACT | Status: AC
  Administered 2018-12-10: 8 via RESPIRATORY_TRACT
  Filled 2018-12-10: qty 6.7

## 2018-12-10 MED ORDER — IPRATROPIUM-ALBUTEROL 0.5-2.5 (3) MG/3ML IN SOLN
3.0000 mL | RESPIRATORY_TRACT | Status: DC
  Administered 2018-12-11: 3 mL via RESPIRATORY_TRACT
  Filled 2018-12-10: qty 3

## 2018-12-10 MED ORDER — LORAZEPAM 2 MG/ML IJ SOLN: 0.0000 mg | Freq: Two times a day (BID) | INTRAMUSCULAR | Status: DC

## 2018-12-10 MED ORDER — VITAMIN B-12 1000 MCG PO TABS
1000.0000 ug | ORAL_TABLET | Freq: Every day | ORAL | Status: DC
  Administered 2018-12-11: 1000 ug via ORAL
  Filled 2018-12-10: qty 1

## 2018-12-10 MED ORDER — LORAZEPAM 2 MG/ML IJ SOLN
0.0000 mg | Freq: Four times a day (QID) | INTRAMUSCULAR | Status: DC
  Filled 2018-12-10: qty 2

## 2018-12-10 MED ORDER — ATORVASTATIN CALCIUM 40 MG PO TABS
40.0000 mg | ORAL_TABLET | Freq: Every day | ORAL | Status: DC
  Administered 2018-12-11: 40 mg via ORAL
  Filled 2018-12-10: qty 1

## 2018-12-10 NOTE — H&P (Signed)
History and Physical    Francis Cardenas LNL:[id number] DOB: [birth year] DOA: 12/10/2018  Referring MD/NP/PA:   PCP: Christain Sacramento, MD   Patient coming from:  The patient is coming from home.  At baseline, pt is independent for most of ADL.        Chief Complaint: SOB, cough, fall, right flank pain  HPI: Francis Cardenas is a 68 y.o. male with medical history significant of hypertension, hyperlipidemia, COPD, GERD, depression with anxiety, CHF, alcohol abuse, OSA on CPAP, prostate cancer (s/p of radiation therapy), who presents with shortness of breath, cough, fall, right flank pain.  Patient states that fell accidentally in the back porch 3 weeks ago. He injured his right side of her rib cage, causing constant, moderate and sharp pain in the right flank area.  He denies any head or neck injury.  No loss of consciousness.  No unilateral weakness or numbness in extremities.  No facial droop or slurred speech.  Patient states that since that fall, he developed shortness breath, which has been progressively worsening.  He has wheezing and cough with yellow-colored mucus production. No fever or chills.  Patient denies nausea vomiting, diarrhea, abdominal pain, symptoms of UTI or unilateral weakness.  ED Course: pt was found to have BNP 144, negative COVID-19 test, potassium 3.4, renal function normal, negative urinalysis, temperature normal, oxygen saturation 99% on 2 L oxygen, blood pressure 150/65, heart rate is 67, RR 20, negative chest x-ray, CT of chest showed right lateral rib fracture (number 3, 4, 5, 6). Pt is placed on telemetry bed for observation.  Review of Systems:   General: no fevers, chills, no body weight gain, has poor appetite, has fatigue HEENT: no blurry vision, hearing changes or sore throat Respiratory: has dyspnea, coughing, wheezing CV: no chest pain, no palpitations GI: no nausea, vomiting, abdominal pain, diarrhea, constipation GU: no dysuria, burning on urination,  increased urinary frequency, hematuria  Ext: has mild leg edema Neuro: no unilateral weakness, numbness, or tingling, no vision change or hearing loss Skin: no rash. Has ecchymosis in the right lateral flank area MSK: pain in right flank area and right lateral rib cage.  Heme: No easy bruising.  Travel history: No recent long distant travel.  Allergy: No Known Allergies  Past Medical History:  Diagnosis Date   BPH (benign prostatic hyperplasia)    Cancer (HCC)    prostate   COPD (chronic obstructive pulmonary disease) (HCC)    Diabetes mellitus without complication ([place])    Hypercholesteremia    Hypertension     Past Surgical History:  Procedure Laterality Date   ACHILLES TENDON REPAIR Left    KNEE ARTHROSCOPY Left    TONSILLECTOMY     WRIST FRACTURE SURGERY Left     Social History:  reports that he has quit smoking. He has never used smokeless tobacco. He reports current alcohol use of about 18.0 standard drinks of alcohol per week. He reports that he does not use drugs.  Family History:  Family History  Problem Relation Age of Onset   CAD Mother    Leukemia Mother    Heart attack Father    Heart attack Brother      Prior to Admission medications   Medication Sig Start Date End Date Taking? Authorizing Provider  albuterol (PROVENTIL HFA;VENTOLIN HFA) 108 (90 BASE) MCG/ACT inhaler Inhale 2 puffs into the lungs every 6 (six) hours as needed for wheezing.   Yes [provider]  ALPRAZolam Duanne Moron) 1 MG  tablet Take 1 mg by mouth at bedtime. 12/05/18  Yes [provider]  amLODipine (NORVASC) 10 MG tablet Take 10 mg by mouth daily with breakfast.    Yes [provider]  aspirin EC 81 MG tablet Take 81 mg by mouth daily with breakfast.    Yes [provider]  atorvastatin (LIPITOR) 80 MG tablet Take 40 mg by mouth daily with supper.    Yes [provider]  budesonide-formoterol (SYMBICORT) 80-4.5 MCG/ACT inhaler Inhale  2 puffs into the lungs 2 (two) times daily.   Yes [provider]  Carboxymethylcellulose Sodium 0.25 % SOLN Place 1 drop into both eyes 4 (four) times daily as needed (dry eyes/ irritation).   Yes [provider]  carvedilol (COREG) 25 MG tablet Take 1 tablet (25 mg total) by mouth 2 (two) times daily with a meal. For hypertension. 03/31/13  Yes Mashburn, Marlane Hatcher, PA-C  cholecalciferol (VITAMIN D) 1000 units tablet Take 1,000 Units by mouth daily with supper.    Yes [provider]  diclofenac sodium (VOLTAREN) 1 % GEL Apply 1 application topically 2 (two) times daily as needed (pain).  10/12/18  Yes [provider]  furosemide (LASIX) 40 MG tablet Take 1 tablet (40 mg total) by mouth 2 (two) times daily. Patient taking differently: Take 40 mg by mouth 2 (two) times daily with a meal.  01/31/18  Yes Barton Dubois, MD  gabapentin (NEURONTIN) 300 MG capsule Take 300 mg by mouth 3 (three) times daily.   Yes [provider]  lisinopril (PRINIVIL,ZESTRIL) 40 MG tablet Take 1 tablet (40 mg total) by mouth daily. For hypertension. Patient taking differently: Take 40 mg by mouth daily with breakfast. For hypertension. 03/31/13  Yes Mashburn, Milta Deiters T, PA-C  magnesium oxide (MAG-OX) 400 MG tablet Take 1 tablet (400 mg total) by mouth daily. Patient taking differently: Take 400 mg by mouth daily with supper.  01/31/18  Yes Barton Dubois, MD  meloxicam (MOBIC) 15 MG tablet Take 15 mg by mouth at bedtime. 10/31/18  Yes [provider]  metFORMIN (GLUCOPHAGE) 500 MG tablet Take 500 mg by mouth daily with breakfast.   Yes [provider]  Multiple Vitamins-Minerals (MULTIVITAMIN WITH MINERALS) tablet Take 1 tablet by mouth daily with lunch.    Yes [provider]  omeprazole (PRILOSEC) 40 MG capsule Take 40 mg by mouth daily with supper.    Yes [provider]  potassium chloride SA (K-DUR,KLOR-CON) 20 MEQ tablet Take 2 tablets (40 mEq  total) by mouth daily. Patient taking differently: Take 40 mEq by mouth daily with supper.  01/31/18  Yes Barton Dubois, MD  PRESCRIPTION MEDICATION Inhale into the lungs at bedtime. CPAP   Yes [provider]  sertraline (ZOLOFT) 100 MG tablet Take one pill by mouth each day for depression and anxiety. Patient taking differently: Take 150 mg by mouth daily with breakfast.  03/31/13  Yes Mashburn, Marlane Hatcher, PA-C  vitamin B-12 (CYANOCOBALAMIN) 1000 MCG tablet Take 1,000 mcg by mouth daily with supper.    Yes [provider]    Physical Exam: Vitals:   12/10/18 1632 12/10/18 1634  BP: (!) 150/65   Pulse: 67   Resp: 20   Temp: 98.5 F (36.9 C)   TempSrc: Oral   SpO2: 99%   Weight:  124.7 kg  Height:  5\' 10"  (1.778 m)   General: Not in acute distress HEENT:       Eyes: PERRL, EOMI, no scleral icterus.  ENT: No discharge from the ears and nose, no pharynx injection, no tonsillar enlargement.        Neck: No JVD, no bruit, no mass felt. Heme: No neck lymph node enlargement. Cardiac: S1/S2, RRR, No murmurs, No gallops or rubs. Respiratory: has wheezing bilaterally GI: Soft, nondistended, nontender, no rebound pain, no organomegaly, BS present. GU: No hematuria Ext: has trace leg edema bilaterally. 2+DP/PT pulse bilaterally. Skin: no rash. Has ecchymosis in the right lateral flank area MSK: pain in right flank area and right lateral rib cage.  Neuro: Alert, oriented X3, cranial nerves II-XII grossly intact, moves all extremities normally.  Psych: Patient is not psychotic, no suicidal or hemocidal ideation.  Labs on Admission: I have personally reviewed following labs and imaging studies  CBC: Recent Labs  Lab 12/10/18 1702 12/10/18 1813  WBC 5.7  --   NEUTROABS 3.8  --   HGB 14.4 14.3  HCT 41.9 42.0  MCV 92.9  --   PLT 149*  --    Basic Metabolic Panel: Recent Labs  Lab 12/10/18 1702 12/10/18 1813  NA 137 137  K 3.4* 3.4*  CL 102  --   CO2 23  --    GLUCOSE 90  --   BUN 6*  --   CREATININE 0.75  --   CALCIUM 10.3  --    GFR: Estimated Creatinine Clearance: 115.5 mL/min (by C-G formula based on SCr of 0.75 mg/dL). Liver Function Tests: Recent Labs  Lab 12/10/18 1702  AST 48*  ALT 48*  ALKPHOS 83  BILITOT 0.7  PROT 7.1  ALBUMIN 4.3   No results for input(s): LIPASE, AMYLASE in the last 168 hours. No results for input(s): AMMONIA in the last 168 hours. Coagulation Profile: No results for input(s): INR, PROTIME in the last 168 hours. Cardiac Enzymes: No results for input(s): CKTOTAL, CKMB, CKMBINDEX, TROPONINI in the last 168 hours. BNP (last 3 results) No results for input(s): PROBNP in the last 8760 hours. HbA1C: No results for input(s): HGBA1C in the last 72 hours. CBG: No results for input(s): GLUCAP in the last 168 hours. Lipid Profile: No results for input(s): CHOL, HDL, LDLCALC, TRIG, CHOLHDL, LDLDIRECT in the last 72 hours. Thyroid Function Tests: No results for input(s): TSH, T4TOTAL, FREET4, T3FREE, THYROIDAB in the last 72 hours. Anemia Panel: No results for input(s): VITAMINB12, FOLATE, FERRITIN, TIBC, IRON, RETICCTPCT in the last 72 hours. Urine analysis:    Component Value Date/Time   COLORURINE STRAW (A) 12/10/2018 1717   APPEARANCEUR CLEAR 12/10/2018 1717   LABSPEC 1.002 (L) 12/10/2018 1717   PHURINE 6.0 12/10/2018 1717   GLUCOSEU NEGATIVE 12/10/2018 1717   HGBUR NEGATIVE 12/10/2018 1717   BILIRUBINUR NEGATIVE 12/10/2018 1717   KETONESUR NEGATIVE 12/10/2018 1717   PROTEINUR NEGATIVE 12/10/2018 1717   UROBILINOGEN 0.2 03/24/2013 1255   NITRITE NEGATIVE 12/10/2018 1717   LEUKOCYTESUR NEGATIVE 12/10/2018 1717   Sepsis Labs: @LABRCNTIP (procalcitonin:4,lacticidven:4) ) Recent Results (from the past 240 hour(s))  SARS Coronavirus 2 (CEPHEID- Performed in [facility] hospital lab), Hosp Order     Status: None   Collection Time: 12/10/18  6:12 PM   Specimen: Nasopharyngeal Swab  Result Value Ref  Range Status   SARS Coronavirus 2 NEGATIVE NEGATIVE Final    Comment: (NOTE) If result is NEGATIVE SARS-CoV-2 target nucleic acids are NOT DETECTED. The SARS-CoV-2 RNA is generally detectable in upper and lower  respiratory specimens during the acute phase of infection. The lowest  concentration of SARS-CoV-2 viral copies this assay  can detect is 250  copies / mL. A negative result does not preclude SARS-CoV-2 infection  and should not be used as the sole basis for treatment or other  patient management decisions.  A negative result may occur with  improper specimen collection / handling, submission of specimen other  than nasopharyngeal swab, presence of viral mutation(s) within the  areas targeted by this assay, and inadequate number of viral copies  (<250 copies / mL). A negative result must be combined with clinical  observations, patient history, and epidemiological information. If result is POSITIVE SARS-CoV-2 target nucleic acids are DETECTED. The SARS-CoV-2 RNA is generally detectable in upper and lower  respiratory specimens dur ing the acute phase of infection.  Positive  results are indicative of active infection with SARS-CoV-2.  Clinical  correlation with patient history and other diagnostic information is  necessary to determine patient infection status.  Positive results do  not rule out bacterial infection or co-infection with other viruses. If result is PRESUMPTIVE POSTIVE SARS-CoV-2 nucleic acids MAY BE PRESENT.   A presumptive positive result was obtained on the submitted specimen  and confirmed on repeat testing.  While 2019 novel coronavirus  (SARS-CoV-2) nucleic acids may be present in the submitted sample  additional confirmatory testing may be necessary for epidemiological  and / or clinical management purposes  to differentiate between  SARS-CoV-2 and other Sarbecovirus currently known to infect humans.  If clinically indicated additional testing with an  alternate test  methodology [phone]) is advised. The SARS-CoV-2 RNA is generally  detectable in upper and lower respiratory sp ecimens during the acute  phase of infection. The expected result is Negative. Fact Sheet for Patients:  StrictlyIdeas.no Fact Sheet for Healthcare Providers: BankingDealers.co.za This test is not yet approved or cleared by the [place] FDA and has been authorized for detection and/or diagnosis of SARS-CoV-2 by FDA under an Emergency Use Authorization (EUA).  This EUA will remain in effect (meaning this test can be used) for the duration of the COVID-19 declaration under Section 564(b)(1) of the Act, 21 U.S.C. section 360bbb-3(b)(1), unless the authorization is terminated or revoked sooner. Performed at [facility], [place] [address], [place], [place] [address]      Radiological Exams on Admission: Ct Chest Wo Contrast  Result Date: 12/10/2018 CLINICAL DATA:  68 year old who fell approximately 3 weeks ago and presents now with wheezing and shortness of breath. RIGHT LATERAL chest wall bruising. Initial encounter. EXAM: CT CHEST WITHOUT CONTRAST TECHNIQUE: Multidetector CT imaging of the chest was performed following the standard protocol without IV contrast. COMPARISON:  CTA chest 12/30/2017. FINDINGS: Cardiovascular: Heart mildly enlarged. Mild-to-moderate three-vessel coronary atherosclerosis. No pericardial effusion. Mild to moderate atherosclerosis involving the thoracic and proximal abdominal aorta without evidence of aneurysm, maximum ascending thoracic aortic diameter 3.8 cm. Bovine aortic arch anatomy (LEFT common carotid artery arises from the innominate artery). Mediastinum/Nodes: No pathologically enlarged mediastinal, hilar or axillary lymph nodes. No mediastinal masses. Normal-appearing esophagus. Normal-appearing thyroid gland. Lungs/Pleura: Pulmonary parenchyma clear without localized airspace  consolidation, interstitial disease, or parenchymal nodules or masses. Central airways patent with mild bronchial wall thickening. No pleural effusions. No pleural plaques or masses. Upper Abdomen: Unremarkable for the unenhanced technique. Musculoskeletal: Mild degenerative disc disease and spondylosis involving the UPPER and midthoracic spine. Severe degenerative disc disease and spondylosis involving the LOWER thoracic spine. Mildly displaced subacute fracture involving the RIGHT LATERAL fourth and fifth ribs and nondisplaced subacute fractures involving the RIGHT LATERAL third and sixth ribs.  IMPRESSION: 1. No acute cardiopulmonary disease. 2. Mildly displaced subacute fractures involving the RIGHT lateral fourth and fifth ribs and nondisplaced subacute fractures involving the RIGHT lateral third and sixth ribs. 3. Mild cardiomegaly. Mild-to-moderate three-vessel coronary atherosclerosis. Aortic Atherosclerosis (ICD10-I70.0). Electronically Signed   By: Evangeline Dakin M.D.   On: 12/10/2018 20:47   Dg Chest Port 1 View  Result Date: 12/10/2018 CLINICAL DATA:  Shortness of breath for the past 3-4 weeks. Pain in the right clavicular region following a fall from a deck. Ex-smoker. EXAM: PORTABLE CHEST 1 VIEW COMPARISON:  01/28/2018. FINDINGS: Normal sized heart. Clear lungs. The lungs are mildly hyperexpanded with mild peribronchial thickening. Thoracic spine degenerative changes. IMPRESSION: No acute abnormality. Mild changes of COPD and chronic bronchitis. Electronically Signed   By: Claudie Revering M.D.   On: 12/10/2018 17:36     EKG: Independently reviewed.  Sinus rhythm, frequent PVC, QTC 478, low voltage.   Assessment/Plan Principal Problem:   COPD exacerbation (HCC) Active Problems:   Alcohol abuse   Benign essential hypertension   Type 2 diabetes mellitus with other specified complication (HCC)   Acute respiratory failure with hypoxia (HCC)   Chronic diastolic CHF (congestive heart failure)  (HCC)   Acute on chronic respiratory failure with hypoxia ([place])   Fall   Multiple rib fractures   OSA on CPAP   Hypokalemia  Acute on chronic respiratory failure with hypoxia due to COPD exacerbation ([place]):    -will place on tele bed for obs -Nebulizers: scheduled Duoneb and prn albuterol -Solu-Medrol 60 mg IV tid -Z pak -Mucinex for cough  -Incentive spirometry -Follow up sputum culture, respiratory virus panel, Flu pcr -Nasal cannula oxygen as needed to maintain O2 saturation 93% or greater   Chronic diastolic CHF: 2D echo 01/23/2670 showed EF of 55 to 60% with grade 2 diastolic dysfunction.  Patient has some mild leg edema, but no JVD.  No pulmonary edema on chest x-ray.  CHF seem to be compensated.  BNP 144. - Continue home Lasix 40 mg twice daily  Alcohol abuse: -Did counseling about the importance of quitting drinking -CIWA protocol  HTN:  -Continue home medications: Amlodipine, Coreg, lisinopril -IV hydralazine prn  Type 2 diabetes mellitus with other specified complication ([place]): Last A1c 5.4 on 12/30/17, well controled. Patient is taking metformin at home -SSI  Fall and Multiple rib fractures: Patient seems to have had a mechanical fall.  Denies head or neck injury.  Patient has multiple right lateral rib fracture.  Likely due to alcohol abuse -Pain control: PRN Percocet -Incentive spirometry -pt/ot  OSA - on CPAP   Hypokalemia: K=3.4 on admission. - Repleted - Check Mg level - Give 1 g of magnesium sulfate  DVT ppx: SQ Lovenox Code Status: Full code Family Communication: None at bed side.  Disposition Plan:  Anticipate discharge back to previous home environment Consults called:  none Admission status: Obs / tele     Date of Service 12/10/2018    [facility]   If 7PM-7AM, please contact night-coverage [email or web address] Password [email or web address] 12/10/2018, 10:10 PM

## 2018-12-10 NOTE — ED Provider Notes (Signed)
[facility] EMERGENCY DEPARTMENT Provider Note   CSN: [id number] Arrival date & time: 12/10/18  1623     History   Chief Complaint Chief Complaint  Patient presents with   Shortness of Breath    HPI Francis Cardenas is a 68 y.o. male.     HPI  Patient is a 68 year old male with past medical history of COPD (not on oxygen at home), CHF, GERD, HLD, OSA, obesity who presents to the emergency department today for evaluation of progressive shortness of breath for the last 3 weeks.  Just preceding his worsening shortness of breath was a fall.  Patient fell off of his porch landing on the ground and says he struck his right side chest wall.  He has had continued pain in that area and says there is bruising.  He has been using his albuterol inhaler more frequently.  He denies any fever or chills.  He states his wife has been telling him that she can hear him wheezing from across the room and that he needs to go to the doctor.  He denies any palpitations.  He has chronic BLE edema that he says is not worse than "normal."    Past Medical History:  Diagnosis Date   BPH (benign prostatic hyperplasia)    Cancer (HCC)    prostate   COPD (chronic obstructive pulmonary disease) ([place])    Diabetes mellitus without complication ([place])    Hypercholesteremia    Hypertension     Patient Active Problem List   Diagnosis Date Noted   Acute on chronic systolic CHF (congestive heart failure) (HCC)    Class 2 severe obesity due to excess calories with serious comorbidity and body mass index (BMI) of 37.0 to 37.9 in adult [facility])    CHF (congestive heart failure) ([place]) 01/28/2018   Acute CHF (congestive heart failure) ([place]) 12/31/2017   Acute respiratory failure with hypoxia ([place]) 12/30/2017   Acute exacerbation of CHF (congestive heart failure) ([place]) 12/30/2017   Chest pain syndrome 12/30/2017   Carcinoma of prostate ([place]) 08/02/2016   COPD (chronic obstructive pulmonary  disease) ([place]) 08/02/2016   GERD (gastroesophageal reflux disease) 08/02/2016   Hyperlipidemia 08/02/2016   Peripheral neuropathy 08/02/2016   Steatosis of liver 08/02/2016   Type 2 diabetes mellitus with other specified complication ([place]) 44/31/5400   Benign essential hypertension 06/11/2013   Alcohol dependence ([place]) 03/26/2013   PTSD (post-traumatic stress disorder) 03/26/2013   Alcohol abuse 03/24/2013   Depressive disorder 03/24/2013    Past Surgical History:  Procedure Laterality Date   ACHILLES TENDON REPAIR Left    KNEE ARTHROSCOPY Left    TONSILLECTOMY     WRIST FRACTURE SURGERY Left         Home Medications    Prior to Admission medications   Medication Sig Start Date End Date Taking? Authorizing Provider  albuterol (PROVENTIL HFA;VENTOLIN HFA) 108 (90 BASE) MCG/ACT inhaler Inhale 2 puffs into the lungs every 6 (six) hours as needed for wheezing.    [provider]  amLODipine (NORVASC) 10 MG tablet Take 10 mg by mouth daily.    [provider]  aspirin EC 81 MG tablet Take 81 mg by mouth daily.    [provider]  atorvastatin (LIPITOR) 40 MG tablet Take 40 mg by mouth every evening.    [provider]  budesonide-formoterol (SYMBICORT) 80-4.5 MCG/ACT inhaler Inhale 2 puffs into the lungs 2 (two) times daily.    [provider]  carvedilol (COREG)  25 MG tablet Take 1 tablet (25 mg total) by mouth 2 (two) times daily with a meal. For hypertension. 03/31/13   Ruben Im, PA-C  cholecalciferol (VITAMIN D) 1000 units tablet Take 1,000 Units by mouth daily.    [provider]  docusate sodium (COLACE) 100 MG capsule Take 100 mg by mouth daily as needed for mild constipation.    [provider]  furosemide (LASIX) 40 MG tablet Take 1 tablet (40 mg total) by mouth 2 (two) times daily. 01/31/18   Barton Dubois, MD  gabapentin (NEURONTIN) 300 MG capsule Take 300 mg by mouth 3 (three) times daily.     [provider]  lisinopril (PRINIVIL,ZESTRIL) 40 MG tablet Take 1 tablet (40 mg total) by mouth daily. For hypertension. 03/31/13   Nena Polio T, PA-C  magnesium oxide (MAG-OX) 400 MG tablet Take 1 tablet (400 mg total) by mouth daily. 01/31/18   Barton Dubois, MD  metFORMIN (GLUCOPHAGE) 500 MG tablet Take 500 mg by mouth daily with breakfast.    [provider]  Misc. Devices MISC by Does not apply route. C-PAP    [provider]  Multiple Vitamins-Minerals (MULTIVITAMIN WITH MINERALS) tablet Take 1 tablet by mouth daily.    [provider]  omeprazole (PRILOSEC) 40 MG capsule Take 40 mg by mouth every evening.    [provider]  potassium chloride SA (K-DUR,KLOR-CON) 20 MEQ tablet Take 2 tablets (40 mEq total) by mouth daily. 01/31/18   Barton Dubois, MD  sertraline (ZOLOFT) 100 MG tablet Take one pill by mouth each day for depression and anxiety. Patient taking differently: Take 150 mg by mouth daily.  03/31/13   Ruben Im, PA-C  vitamin B-12 (CYANOCOBALAMIN) 1000 MCG tablet Take 1,000 mcg by mouth daily.    [provider]    Family History No family history on file.  Social History Social History   Tobacco Use   Smoking status: Former Smoker   Smokeless tobacco: Never Used  Substance Use Topics   Alcohol use: Yes    Alcohol/week: 18.0 standard drinks    Types: 18 Cans of beer per week    Comment: 18 cans beer/day   Drug use: No     Allergies   Patient has no known allergies.   Review of Systems Review of Systems  Constitutional: Negative for chills and fever.  HENT: Negative for ear pain and sore throat.   Eyes: Negative for pain and visual disturbance.  Respiratory: Positive for shortness of breath and wheezing. Negative for cough.   Cardiovascular: Positive for chest pain (right chest wall pain) and leg swelling. Negative for palpitations.  Gastrointestinal: Negative for abdominal pain and  vomiting.  Genitourinary: Negative for dysuria and hematuria.  Musculoskeletal: Negative for arthralgias and back pain.  Skin: Positive for wound. Negative for color change and rash.  Neurological: Negative for seizures and syncope.  All other systems reviewed and are negative.    Physical Exam Updated Vital Signs BP (!) 150/65 (BP Location: Right Arm)    Pulse 67    Temp 98.5 F (36.9 C) (Oral)    Resp 20    Ht 5\' 10"  (1.778 m)    Wt 124.7 kg    SpO2 99%    BMI 39.46 kg/m   Physical Exam Vitals signs and nursing note reviewed.  Constitutional:      Appearance: He is well-developed.  HENT:     Head: Normocephalic and atraumatic.  Eyes:  Conjunctiva/sclera: Conjunctivae normal.  Neck:     Musculoskeletal: Neck supple.  Cardiovascular:     Rate and Rhythm: Normal rate and regular rhythm.     Heart sounds: No murmur.  Pulmonary:     Effort: No respiratory distress.     Breath sounds: Wheezing (Diffuse, inspiratory and expiratory) present.     Comments: audible wheezes from several feet away are notable, even without stethoscope. Increased effort Abdominal:     Palpations: Abdomen is soft.     Tenderness: There is no abdominal tenderness.  Musculoskeletal:     Right lower leg: Edema (1+ to ankle) present.     Left lower leg: Edema (1+ to ankle) present.  Skin:    General: Skin is warm and dry.  Neurological:     Mental Status: He is alert.      ED Treatments / Results  Labs (all labs ordered are listed, but only abnormal results are displayed) Labs Reviewed - No data to display  EKG EKG Interpretation  Date/Time:  Monday December 10 2018 16:28:28 EDT Ventricular Rate:  71 PR Interval:    QRS Duration: 117 QT Interval:  439 QTC Calculation: 478 R Axis:   77 Text Interpretation:  Sinus rhythm new Multiple ventricular premature complexes Borderline prolonged PR interval Nonspecific intraventricular conduction delay Low voltage, precordial leads Confirmed by  Blanchie Dessert [phone]) on 12/10/2018 4:44:01 PM   Radiology Ct Chest Wo Contrast  Result Date: 12/10/2018 CLINICAL DATA:  68 year old who fell approximately 3 weeks ago and presents now with wheezing and shortness of breath. RIGHT LATERAL chest wall bruising. Initial encounter. EXAM: CT CHEST WITHOUT CONTRAST TECHNIQUE: Multidetector CT imaging of the chest was performed following the standard protocol without IV contrast. COMPARISON:  CTA chest 12/30/2017. FINDINGS: Cardiovascular: Heart mildly enlarged. Mild-to-moderate three-vessel coronary atherosclerosis. No pericardial effusion. Mild to moderate atherosclerosis involving the thoracic and proximal abdominal aorta without evidence of aneurysm, maximum ascending thoracic aortic diameter 3.8 cm. Bovine aortic arch anatomy (LEFT common carotid artery arises from the innominate artery). Mediastinum/Nodes: No pathologically enlarged mediastinal, hilar or axillary lymph nodes. No mediastinal masses. Normal-appearing esophagus. Normal-appearing thyroid gland. Lungs/Pleura: Pulmonary parenchyma clear without localized airspace consolidation, interstitial disease, or parenchymal nodules or masses. Central airways patent with mild bronchial wall thickening. No pleural effusions. No pleural plaques or masses. Upper Abdomen: Unremarkable for the unenhanced technique. Musculoskeletal: Mild degenerative disc disease and spondylosis involving the UPPER and midthoracic spine. Severe degenerative disc disease and spondylosis involving the LOWER thoracic spine. Mildly displaced subacute fracture involving the RIGHT LATERAL fourth and fifth ribs and nondisplaced subacute fractures involving the RIGHT LATERAL third and sixth ribs. IMPRESSION: 1. No acute cardiopulmonary disease. 2. Mildly displaced subacute fractures involving the RIGHT lateral fourth and fifth ribs and nondisplaced subacute fractures involving the RIGHT lateral third and sixth ribs. 3. Mild cardiomegaly.  Mild-to-moderate three-vessel coronary atherosclerosis. Aortic Atherosclerosis (ICD10-I70.0). Electronically Signed   By: Evangeline Dakin M.D.   On: 12/10/2018 20:47   Dg Chest Port 1 View  Result Date: 12/10/2018 CLINICAL DATA:  Shortness of breath for the past 3-4 weeks. Pain in the right clavicular region following a fall from a deck. Ex-smoker. EXAM: PORTABLE CHEST 1 VIEW COMPARISON:  01/28/2018. FINDINGS: Normal sized heart. Clear lungs. The lungs are mildly hyperexpanded with mild peribronchial thickening. Thoracic spine degenerative changes. IMPRESSION: No acute abnormality. Mild changes of COPD and chronic bronchitis. Electronically Signed   By: Claudie Revering M.D.   On: 12/10/2018 17:36    Procedures  Procedures (including critical care time)  Medications Ordered in ED Medications    Followed by  methylPREDNISolone sodium succinate (SOLU-MEDROL) 125 mg/2 mL injection 125 mg (125 mg Intravenous Given 12/10/18 1808)  albuterol (VENTOLIN HFA) 108 (90 Base) MCG/ACT inhaler 8 puff (8 puffs Inhalation Given 12/10/18 1811)  albuterol (VENTOLIN HFA) 108 (90 Base) MCG/ACT inhaler 4 puff (4 puffs Inhalation Given 12/10/18 2206)     Initial Impression / Assessment and Plan / ED Course  I have reviewed the triage vital signs and the nursing notes.  Pertinent labs & imaging results that were available during my care of the patient were reviewed by me and considered in my medical decision making (see chart for details).  Of note, this patient was evaluated in the Emergency Department for the symptoms described in the history of present illness. He was evaluated in the context of the global COVID-19 pandemic, which necessitated consideration that the patient might be at risk for infection with the SARS-CoV-2 virus that causes COVID-19. Institutional protocols and algorithms that pertain to the evaluation of patients at risk for COVID-19 are in a state of rapid change based on information released by  regulatory bodies including the CDC and federal and state organizations. These policies and algorithms were followed during the patient's care in the ED.  During this patient encounter, the patient was wearing a mask, and throughout this encounter I was wearing at least a surgical mask.  I was not within 6 feet of this patient for more than 15 minutes without eye protection when they were not wearing mask.   Differentials considered: COPD exacerbation, pneumonia, PE, pneumothorax, rib fractures  EM Physician interpretation of Labs & Imaging:  BNP not elevated above baseline  UA normal  CMP unremarkable  Blood gas grossly unremarkable  His CT chest shows mildly displaced subacute fractures involving the right lateral fourth and fifth ribs as well as nondisplaced subacute fractures involving the right lateral third and sixth ribs.  Medical Decision Making:  Francis Cardenas is a 68 y.o. male with the above past medical history who presented to the emergency department today for evaluation of progressive shortness of breath over the last 3 weeks, with worsening wheezing and increased use of his albuterol inhaler at home.  He fell off his porch several weeks ago as well and has some ecchymosis to his right midaxillary chest wall with mild tenderness on exam, concern for possible underlying chest wall injury including rib fractures.  We will treat with 8 puffs and spacer, IV Solu-Medrol, close reassessment and plan for likely admission.   On reassessment, his work of breathing is greatly improved and his wheezing persists to the extent that it can still be heard rather easily without a stethoscope. Additional, regular puffs x4 ordered. Patient will be admitted to Hospitalist service.   The plan for this patient was discussed with my attending physician, Dr. Blanchie Dessert, who voiced agreement and who oversaw evaluation and treatment of this patient.   CLINICAL IMPRESSION: 1. COPD  exacerbation ([place])   2. Closed fracture of multiple ribs of right side, initial encounter      Disposition: Admit  Couper Juncaj A. Jimmye Norman, MD Resident Physician, PGY-3 Emergency Medicine [facility]    Jefm Petty, MD 12/11/18 1428    Blanchie Dessert, MD 12/11/18 [phone]

## 2018-12-10 NOTE — ED Notes (Signed)
WifeAudelia Acton, [phone] for updates

## 2018-12-10 NOTE — ED Notes (Signed)
Turkey sandwich and water given to pt.

## 2018-12-10 NOTE — ED Triage Notes (Signed)
Pt to ED via [facility] with c/o shortness of breath x's 3 weeks.  Pt st's shortness of breath started after a fall 3 weeks ago but was not seen for same.  Pt has COPD and has audible wheezing on arrival to ED

## 2018-12-11 ENCOUNTER — Other Ambulatory Visit: Payer: Self-pay

## 2018-12-11 DIAGNOSIS — W1789XA Other fall from one level to another, initial encounter: Secondary | ICD-10-CM | POA: Diagnosis present

## 2018-12-11 DIAGNOSIS — Z7951 Long term (current) use of inhaled steroids: Secondary | ICD-10-CM | POA: Diagnosis not present

## 2018-12-11 DIAGNOSIS — E876 Hypokalemia: Secondary | ICD-10-CM | POA: Diagnosis present

## 2018-12-11 DIAGNOSIS — Z7982 Long term (current) use of aspirin: Secondary | ICD-10-CM | POA: Diagnosis not present

## 2018-12-11 DIAGNOSIS — Z8546 Personal history of malignant neoplasm of prostate: Secondary | ICD-10-CM | POA: Diagnosis not present

## 2018-12-11 DIAGNOSIS — E1165 Type 2 diabetes mellitus with hyperglycemia: Secondary | ICD-10-CM | POA: Diagnosis present

## 2018-12-11 DIAGNOSIS — I10 Essential (primary) hypertension: Secondary | ICD-10-CM | POA: Diagnosis not present

## 2018-12-11 DIAGNOSIS — I5032 Chronic diastolic (congestive) heart failure: Secondary | ICD-10-CM | POA: Diagnosis present

## 2018-12-11 DIAGNOSIS — Z20828 Contact with and (suspected) exposure to other viral communicable diseases: Secondary | ICD-10-CM | POA: Diagnosis present

## 2018-12-11 DIAGNOSIS — F1010 Alcohol abuse, uncomplicated: Secondary | ICD-10-CM | POA: Diagnosis present

## 2018-12-11 DIAGNOSIS — E785 Hyperlipidemia, unspecified: Secondary | ICD-10-CM | POA: Diagnosis present

## 2018-12-11 DIAGNOSIS — Z923 Personal history of irradiation: Secondary | ICD-10-CM | POA: Diagnosis not present

## 2018-12-11 DIAGNOSIS — I110 Hypertensive heart disease with heart failure: Secondary | ICD-10-CM | POA: Diagnosis present

## 2018-12-11 DIAGNOSIS — J9621 Acute and chronic respiratory failure with hypoxia: Secondary | ICD-10-CM | POA: Diagnosis present

## 2018-12-11 DIAGNOSIS — Z9181 History of falling: Secondary | ICD-10-CM | POA: Diagnosis not present

## 2018-12-11 DIAGNOSIS — Z6839 Body mass index (BMI) 39.0-39.9, adult: Secondary | ICD-10-CM | POA: Diagnosis not present

## 2018-12-11 DIAGNOSIS — N400 Enlarged prostate without lower urinary tract symptoms: Secondary | ICD-10-CM | POA: Diagnosis present

## 2018-12-11 DIAGNOSIS — Z87891 Personal history of nicotine dependence: Secondary | ICD-10-CM | POA: Diagnosis not present

## 2018-12-11 DIAGNOSIS — J9601 Acute respiratory failure with hypoxia: Secondary | ICD-10-CM | POA: Diagnosis not present

## 2018-12-11 DIAGNOSIS — K219 Gastro-esophageal reflux disease without esophagitis: Secondary | ICD-10-CM | POA: Diagnosis present

## 2018-12-11 DIAGNOSIS — F418 Other specified anxiety disorders: Secondary | ICD-10-CM | POA: Diagnosis present

## 2018-12-11 DIAGNOSIS — Y92008 Other place in unspecified non-institutional (private) residence as the place of occurrence of the external cause: Secondary | ICD-10-CM | POA: Diagnosis not present

## 2018-12-11 DIAGNOSIS — Z79899 Other long term (current) drug therapy: Secondary | ICD-10-CM | POA: Diagnosis not present

## 2018-12-11 DIAGNOSIS — S2241XA Multiple fractures of ribs, right side, initial encounter for closed fracture: Secondary | ICD-10-CM | POA: Diagnosis present

## 2018-12-11 DIAGNOSIS — G4733 Obstructive sleep apnea (adult) (pediatric): Secondary | ICD-10-CM | POA: Diagnosis present

## 2018-12-11 DIAGNOSIS — J441 Chronic obstructive pulmonary disease with (acute) exacerbation: Secondary | ICD-10-CM | POA: Diagnosis present

## 2018-12-11 DIAGNOSIS — T380X5A Adverse effect of glucocorticoids and synthetic analogues, initial encounter: Secondary | ICD-10-CM | POA: Diagnosis present

## 2018-12-11 LAB — BASIC METABOLIC PANEL
Anion gap: 9 (ref 5–15)
BUN: 16 mg/dL (ref 8–23)
CO2: 24 mmol/L (ref 22–32)
Calcium: 11 mg/dL — ABNORMAL HIGH (ref 8.9–10.3)
Chloride: 102 mmol/L (ref 98–111)
Creatinine, Ser: 0.89 mg/dL (ref 0.61–1.24)
GFR calc Af Amer: >60 mL/min (ref >60)
GFR calc non Af Amer: >60 mL/min (ref >60)
Glucose, Bld: 243 mg/dL — ABNORMAL HIGH (ref 70–99)
Potassium: 4 mmol/L (ref 3.5–5.1)
Sodium: 135 mmol/L (ref 135–145)

## 2018-12-11 LAB — RESPIRATORY PANEL BY PCR

## 2018-12-11 LAB — GLUCOSE, CAPILLARY
Glucose-Capillary: 142 mg/dL — ABNORMAL HIGH (ref 70–99)
Glucose-Capillary: 197 mg/dL — ABNORMAL HIGH (ref 70–99)
Glucose-Capillary: 204 mg/dL — ABNORMAL HIGH (ref 70–99)
Glucose-Capillary: 234 mg/dL — ABNORMAL HIGH (ref 70–99)
Glucose-Capillary: 242 mg/dL — ABNORMAL HIGH (ref 70–99)

## 2018-12-11 LAB — INFLUENZA PANEL BY PCR (TYPE A & B)
Influenza A By PCR: NEGATIVE
Influenza B By PCR: NEGATIVE

## 2018-12-11 LAB — MAGNESIUM: Magnesium: 2.1 mg/dL (ref 1.7–2.4)

## 2018-12-11 LAB — HIV ANTIBODY (ROUTINE TESTING W REFLEX): HIV Screen 4th Generation wRfx: NONREACTIVE

## 2018-12-11 MED ORDER — DM-GUAIFENESIN ER 30-600 MG PO TB12
1.0000 | ORAL_TABLET | Freq: Two times a day (BID) | ORAL | Status: DC
  Administered 2018-12-11 – 2018-12-12 (×3): 1 via ORAL
  Filled 2018-12-11 (×3): qty 1

## 2018-12-11 MED ORDER — IPRATROPIUM-ALBUTEROL 0.5-2.5 (3) MG/3ML IN SOLN
3.0000 mL | Freq: Three times a day (TID) | RESPIRATORY_TRACT | Status: DC
  Administered 2018-12-11 – 2018-12-12 (×4): 3 mL via RESPIRATORY_TRACT
  Filled 2018-12-11 (×4): qty 3

## 2018-12-11 NOTE — Evaluation (Signed)
Occupational Therapy Evaluation Patient Details Name: Francis Cardenas MRN: [id number] DOB: [birth year] Today's Date: 12/11/2018    History of Present Illness Pt adm with copd exacerbation and recent fall with rt rib fx's 3-6. PMH - chf, htn, dm, etoh abuse, obesity   Clinical Impression   Patient presenting with decreased I in self care, balance, functional mobility/transfers, endurance, strength, and safety awareness. Patient reports being mod I PTA but has frequent falls at home. Patient currently functioning at close supervision. Patient will benefit from acute OT to increase overall independence in the areas of ADLs, functional mobility, and safety awareness in order to safely discharge home with caregiver. Pt would benefit from energy conservation education.     Follow Up Recommendations  No OT follow up;Supervision/Assistance - 24 hour    Equipment Recommendations  None recommended by OT    Recommendations for Other Services Other (comment)(none at this time)     Precautions / Restrictions Precautions Precautions: Fall      Mobility Bed Mobility     General bed mobility comments: Pt up in chair  Transfers Overall transfer level: Needs assistance Equipment used: None Transfers: Sit to/from Stand Sit to Stand: Supervision         General transfer comment: supervision for safety    Balance Overall balance assessment: Needs assistance Sitting-balance support: No upper extremity supported;Feet supported Sitting balance-Leahy Scale: Good     Standing balance support: No upper extremity supported;During functional activity Standing balance-Leahy Scale: Good          ADL either performed or assessed with clinical judgement   ADL Overall ADL's : Needs assistance/impaired Eating/Feeding: Modified independent   Grooming: Wash/dry hands;Wash/dry face;Standing;Supervision/safety   Upper Body Bathing: Supervision/ safety;Sitting   Lower Body Bathing: Supervison/  safety;Sit to/from stand   Upper Body Dressing : Supervision/safety   Lower Body Dressing: Supervision/safety;Sit to/from stand   Toilet Transfer: Supervision/safety   Toileting- Water quality scientist and Hygiene: Supervision/safety         General ADL Comments: supervision overall for all tasks. Pt needing multple rest breaks secondary to fatigue     Vision Baseline Vision/History: Wears glasses Wears Glasses: At all times Patient Visual Report: No change from baseline              Pertinent Vitals/Pain Pain Score: 5  Pain Location: rt ribs/flank Pain Descriptors / Indicators: Sore;Aching Pain Intervention(s): Limited activity within patient's tolerance;Monitored during session;Repositioned        Extremity/Trunk Assessment Upper Extremity Assessment Upper Extremity Assessment: Overall WFL for tasks assessed   Lower Extremity Assessment Lower Extremity Assessment: Defer to PT evaluation          Cognition Arousal/Alertness: Awake/alert Behavior During Therapy: WFL for tasks assessed/performed Overall Cognitive Status: Within Functional Limits for tasks assessed                     Home Living Family/patient expects to be discharged to:: Private residence   Available Help at Discharge: Family;Available 24 hours/day Type of Home: House Home Access: Stairs to enter [employer] of Steps: 2         Bathroom Shower/Tub: Occupational psychologist: Standard     Home Equipment: Environmental consultant - 4 wheels   Additional Comments: pt does sink bathing      Prior Functioning/Environment Level of Independence: Independent        Comments: Pt reports his rollator is a bariatric rollator and that it is too large to be functional  in his home        OT Problem List: Decreased strength;Pain;Decreased activity tolerance;Decreased safety awareness;Impaired balance (sitting and/or standing);Impaired sensation;Decreased coordination      OT  Treatment/Interventions: Self-care/ADL training;Therapeutic exercise;Therapeutic activities;Cognitive remediation/compensation;Energy conservation;Patient/family education;Balance training;Manual therapy    OT Goals(Current goals can be found in the care plan section) Acute Rehab OT Goals Patient Stated Goal: return home OT Goal Formulation: With patient Time For Goal Achievement: 12/25/18 Potential to Achieve Goals: Good ADL Goals Pt Will Perform Grooming: with modified independence Pt Will Perform Upper Body Bathing: with modified independence Pt Will Perform Lower Body Bathing: with modified independence Pt Will Perform Upper Body Dressing: with modified independence Pt Will Perform Lower Body Dressing: with modified independence Pt Will Transfer to Toilet: with modified independence Pt Will Perform Toileting - Clothing Manipulation and hygiene: with modified independence  OT Frequency: Min 2X/week   Barriers to D/C: (none at this time)             AM-PAC OT "6 Clicks" Daily Activity     Outcome Measure Help from another person eating meals?: None Help from another person taking care of personal grooming?: A Little Help from another person toileting, which includes using toliet, bedpan, or urinal?: A Little Help from another person bathing (including washing, rinsing, drying)?: A Little Help from another person to put on and taking off regular upper body clothing?: None Help from another person to put on and taking off regular lower body clothing?: A Little 6 Click Score: 20   End of Session Nurse Communication: Mobility status  Activity Tolerance: Patient tolerated treatment well Patient left: in chair;with call bell/phone within reach;with chair alarm set  OT Visit Diagnosis: Muscle weakness (generalized) (M62.81);History of falling (Z91.81)                Time: 1337-1400 OT Time Calculation (min): 23 min Charges:  OT General Charges $OT Visit: 1 Visit OT  Evaluation $OT Eval Low Complexity: 1 Low OT Treatments $Self Care/Home Management : 8-22 mins   Arnet Hofferber P, MS, OTR/L 12/11/2018, 2:54 PM

## 2018-12-11 NOTE — Evaluation (Signed)
Physical Therapy Evaluation Patient Details Name: Francis Cardenas MRN: [id number] DOB: 26-May-1950 Today's Date: 12/11/2018   History of Present Illness  Pt adm with copd exacerbation and recent fall with rt rib fx's 3-6. PMH - chf, htn, dm, etoh abuse, obesity  Clinical Impression  Pt presents to PT with slightly unsteady gait. Expect will return to baseline quickly. Will follow acutely and don't feel will need PT after DC. Pt with slight decr in SpO2 with activity which will hopefully improve in the next few days.     Follow Up Recommendations No PT follow up    Equipment Recommendations  None recommended by PT    Recommendations for Other Services       Precautions / Restrictions Precautions Precautions: Fall Restrictions Weight Bearing Restrictions: No      Mobility  Bed Mobility               General bed mobility comments: Pt up in chair  Transfers Overall transfer level: Needs assistance Equipment used: None Transfers: Sit to/from Stand Sit to Stand: Supervision         General transfer comment: supervision for safety  Ambulation/Gait Ambulation/Gait assistance: Min guard Gait Distance (Feet): 250 Feet Assistive device: None;4-wheeled walker Gait Pattern/deviations: Step-through pattern;Decreased stride length;Drifts right/left Gait velocity: decr Gait velocity interpretation: 1.31 - 2.62 ft/sec, indicative of limited community ambulator General Gait Details: Assist for safety. Pt drifts with gait but no overt loss of balance. Amb on RA with SpO2 86-87%  Stairs            Wheelchair Mobility    Modified Rankin (Stroke Patients Only)       Balance Overall balance assessment: Needs assistance Sitting-balance support: No upper extremity supported;Feet supported Sitting balance-Leahy Scale: Good     Standing balance support: No upper extremity supported;During functional activity Standing balance-Leahy Scale: Good                                Pertinent Vitals/Pain Pain Assessment: 0-10 Pain Score: 4  Pain Location: rt ribs/flank Pain Descriptors / Indicators: Sore Pain Intervention(s): Limited activity within patient's tolerance;Premedicated before session    Home Living Family/patient expects to be discharged to:: Private residence Living Arrangements: Spouse/significant other Available Help at Discharge: Family;Available 24 hours/day Type of Home: House Home Access: Stairs to enter   [employer] of Steps: 2   Home Equipment: Walker - 4 wheels      Prior Function Level of Independence: Independent         Comments: Pt reports his rollator is a bariatric rollator and that it is too large to be functional in his home     Hand Dominance        Extremity/Trunk Assessment   Upper Extremity Assessment Upper Extremity Assessment: Defer to OT evaluation    Lower Extremity Assessment Lower Extremity Assessment: Overall WFL for tasks assessed       Communication   Communication: No difficulties  Cognition Arousal/Alertness: Awake/alert Behavior During Therapy: WFL for tasks assessed/performed Overall Cognitive Status: Within Functional Limits for tasks assessed                                        General Comments      Exercises     Assessment/Plan    PT Assessment Patient needs continued PT  services  PT Problem List Decreased activity tolerance;Decreased balance;Decreased mobility       PT Treatment Interventions DME instruction;Gait training;Functional mobility training;Therapeutic activities;Therapeutic exercise;Balance training;Patient/family education    PT Goals (Current goals can be found in the Care Plan section)  Acute Rehab PT Goals Patient Stated Goal: return home PT Goal Formulation: With patient Time For Goal Achievement: 12/25/18 Potential to Achieve Goals: Good    Frequency Min 3X/week   Barriers to discharge         Co-evaluation               AM-PAC PT "6 Clicks" Mobility  Outcome Measure Help needed turning from your back to your side while in a flat bed without using bedrails?: None Help needed moving from lying on your back to sitting on the side of a flat bed without using bedrails?: None Help needed moving to and from a bed to a chair (including a wheelchair)?: A Little Help needed standing up from a chair using your arms (e.g., wheelchair or bedside chair)?: A Little Help needed to walk in hospital room?: A Little Help needed climbing 3-5 steps with a railing? : A Little 6 Click Score: 20    End of Session   Activity Tolerance: Patient tolerated treatment well Patient left: in chair;with call bell/phone within reach Nurse Communication: Mobility status PT Visit Diagnosis: Unsteadiness on feet (R26.81)    Time: 3254-9826 PT Time Calculation (min) (ACUTE ONLY): 18 min   Charges:   PT Evaluation $PT Eval Moderate Complexity: 1 Mod          [place] Pager [phone] Office [place] 12/11/2018, 10:58 AM

## 2018-12-11 NOTE — Progress Notes (Signed)
PROGRESS NOTE    Francis Cardenas  PNT:[id number] DOB: 1950-06-01 DOA: 12/10/2018 PCP: Christain [place], MD   Brief Narrative: 68 year old with past medical history significant for hypertension, hyperlipidemia, COPD, GERD, depression with anxiety, CHF, alcohol abuse, OSA on CPAP, prostate cancer status post radiation therapy who presents with shortness of breath cough recent fall and right flank pain.  Patient reports that he fell accidentally in the back of his porch 3 weeks ago.  He injured his right side, rib cage.  He has been having constant sharp pain.  Patient relates that ever since he fell he has developed shortness of breath, which has been progressively getting worse.  He reports productive cough with yellow-colored mucus and wheezing.  Evaluation in the ED: BNP 144, negative COVID test, potassium 3.4, renal function normal, oxygen saturation 99 on 2 L.  CT chest showed right lateral rib fracture #[id number].   Patient was found to be in COPD exacerbation.  Assessment & Plan:   Principal Problem:   COPD exacerbation ([place]) Active Problems:   Alcohol abuse   Benign essential hypertension   Type 2 diabetes mellitus with other specified complication (HCC)   Acute respiratory failure with hypoxia (HCC)   Chronic diastolic CHF (congestive heart failure) (HCC)   Acute on chronic respiratory failure with hypoxia ([place])   Fall   Multiple rib fractures   OSA on CPAP   Hypokalemia   1-Acute on chronic hypoxic respiratory failure due to COPD exacerbation: Patient still with significant shortness of breath, he still feels that he is not able to speak in full sentences. I will continue with IV Solu-Medrol, azithromycin, Mucinex, scheduled nebulizer treatment Incentive spirometry SARS rhinovirus to negative.  2-Chronic diastolic heart failure: Appears to be compensated.  Continue with home Lasix 40 mg twice daily.  3-Alcohol abuse Monitor on CIWA protocol  4-Hypertension: Continue with  Norvasc, Coreg, lisinopril.  5-Diabetes type 2 Hold metformin while inpatient. Sliding scale insulin  6-fall and multiple rib fractures Continue with pain management, Percocet as needed. Incentive spirometry        Estimated body mass index is 39.8 kg/m as calculated from the following:   Height as of this encounter: 5\' 10"  (1.778 m).   Weight as of this encounter: 125.8 kg.   DVT prophylaxis: Lovenox Code Status: Full code Family Communication: Care discussed with patient Disposition Plan: Remain in the hospital for treatment of acute hypoxic respiratory failure, will continue 1 more day of IV Solu-Medrol Consultants:   None  Procedures:   None  Antimicrobials:  Azithromycin  Subjective: He is a still feeling very short of breath, complaining of productive cough, feel he cannot speak in full sentences.  Objective: Vitals:   12/11/18 0846 12/11/18 0858 12/11/18 1200 12/11/18 1432  BP:  (!) 160/68 (!) 141/53   Pulse:  75 67 67  Resp:  16  16  Temp:  98.8 F (37.1 C)    TempSrc:  Oral    SpO2: 98% 98%  98%  Weight:      Height:        Intake/Output Summary (Last 24 hours) at 12/11/2018 1434 Last data filed at 12/11/2018 0867 Gross per 24 hour  Intake --  Output 1900 ml  Net -1900 ml   Filed Weights   12/10/18 1634 12/10/18 2336  Weight: 124.7 kg 125.8 kg    Examination:  General exam: Appears calm and comfortable  Respiratory system: Bilateral sporadic expiratory wheezing Cardiovascular system: S1 & S2 heard, RRR. No  JVD, murmurs, rubs, gallops or clicks. No pedal edema. Gastrointestinal system: Abdomen is nondistended, soft and nontender. No organomegaly or masses felt. Normal bowel sounds heard. Central nervous system: Alert and oriented. No focal neurological deficits. Extremities: Symmetric 5 x 5 power. Skin: No rashes, lesions or ulcers Psychiatry: Judgement and insight appear normal. Mood & affect appropriate.     Data Reviewed: I have  personally reviewed following labs and imaging studies  CBC: Recent Labs  Lab 12/10/18 1702 12/10/18 1813  WBC 5.7  --   NEUTROABS 3.8  --   HGB 14.4 14.3  HCT 41.9 42.0  MCV 92.9  --   PLT 149*  --    Basic Metabolic Panel: Recent Labs  Lab 12/10/18 1702 12/10/18 1813 12/11/18 0011  NA 137 137  --   K 3.4* 3.4*  --   CL 102  --   --   CO2 23  --   --   GLUCOSE 90  --   --   BUN 6*  --   --   CREATININE 0.75  --   --   CALCIUM 10.3  --   --   MG  --   --  2.1   GFR: Estimated Creatinine Clearance: 116 mL/min (by C-G formula based on SCr of 0.75 mg/dL). Liver Function Tests: Recent Labs  Lab 12/10/18 1702  AST 48*  ALT 48*  ALKPHOS 83  BILITOT 0.7  PROT 7.1  ALBUMIN 4.3   No results for input(s): LIPASE, AMYLASE in the last 168 hours. No results for input(s): AMMONIA in the last 168 hours. Coagulation Profile: No results for input(s): INR, PROTIME in the last 168 hours. Cardiac Enzymes: No results for input(s): CKTOTAL, CKMB, CKMBINDEX, TROPONINI in the last 168 hours. BNP (last 3 results) No results for input(s): PROBNP in the last 8760 hours. HbA1C: No results for input(s): HGBA1C in the last 72 hours. CBG: Recent Labs  Lab 12/11/18 0011 12/11/18 0718 12/11/18 1155  GLUCAP 197* 142* 204*   Lipid Profile: No results for input(s): CHOL, HDL, LDLCALC, TRIG, CHOLHDL, LDLDIRECT in the last 72 hours. Thyroid Function Tests: No results for input(s): TSH, T4TOTAL, FREET4, T3FREE, THYROIDAB in the last 72 hours. Anemia Panel: No results for input(s): VITAMINB12, FOLATE, FERRITIN, TIBC, IRON, RETICCTPCT in the last 72 hours. Sepsis Labs: No results for input(s): PROCALCITON, LATICACIDVEN in the last 168 hours.  Recent Results (from the past 240 hour(s))  SARS Coronavirus 2 (CEPHEID- Performed in [place] hospital lab), Hosp Order     Status: None   Collection Time: 12/10/18  6:12 PM   Specimen: Nasopharyngeal Swab  Result Value Ref Range Status    SARS Coronavirus 2 NEGATIVE NEGATIVE Final    Comment: (NOTE) If result is NEGATIVE SARS-CoV-2 target nucleic acids are NOT DETECTED. The SARS-CoV-2 RNA is generally detectable in upper and lower  respiratory specimens during the acute phase of infection. The lowest  concentration of SARS-CoV-2 viral copies this assay can detect is 250  copies / mL. A negative result does not preclude SARS-CoV-2 infection  and should not be used as the sole basis for treatment or other  patient management decisions.  A negative result may occur with  improper specimen collection / handling, submission of specimen other  than nasopharyngeal swab, presence of viral mutation(s) within the  areas targeted by this assay, and inadequate number of viral copies  (<250 copies / mL). A negative result must be combined with clinical  observations, patient history,  and epidemiological information. If result is POSITIVE SARS-CoV-2 target nucleic acids are DETECTED. The SARS-CoV-2 RNA is generally detectable in upper and lower  respiratory specimens dur ing the acute phase of infection.  Positive  results are indicative of active infection with SARS-CoV-2.  Clinical  correlation with patient history and other diagnostic information is  necessary to determine patient infection status.  Positive results do  not rule out bacterial infection or co-infection with other viruses. If result is PRESUMPTIVE POSTIVE SARS-CoV-2 nucleic acids MAY BE PRESENT.   A presumptive positive result was obtained on the submitted specimen  and confirmed on repeat testing.  While 2019 novel coronavirus  (SARS-CoV-2) nucleic acids may be present in the submitted sample  additional confirmatory testing may be necessary for epidemiological  and / or clinical management purposes  to differentiate between  SARS-CoV-2 and other Sarbecovirus currently known to infect humans.  If clinically indicated additional testing with an alternate test   methodology [phone]) is advised. The SARS-CoV-2 RNA is generally  detectable in upper and lower respiratory sp ecimens during the acute  phase of infection. The expected result is Negative. Fact Sheet for Patients:  StrictlyIdeas.no Fact Sheet for Healthcare Providers: BankingDealers.co.za This test is not yet approved or cleared by the [place] FDA and has been authorized for detection and/or diagnosis of SARS-CoV-2 by FDA under an Emergency Use Authorization (EUA).  This EUA will remain in effect (meaning this test can be used) for the duration of the COVID-19 declaration under Section 564(b)(1) of the Act, 21 U.S.C. section 360bbb-3(b)(1), unless the authorization is terminated or revoked sooner. Performed at [facility], [place] [address], [place], [place] [address]          Radiology Studies: Ct Chest Wo Contrast  Result Date: 12/10/2018 CLINICAL DATA:  68 year old who fell approximately 3 weeks ago and presents now with wheezing and shortness of breath. RIGHT LATERAL chest wall bruising. Initial encounter. EXAM: CT CHEST WITHOUT CONTRAST TECHNIQUE: Multidetector CT imaging of the chest was performed following the standard protocol without IV contrast. COMPARISON:  CTA chest 12/30/2017. FINDINGS: Cardiovascular: Heart mildly enlarged. Mild-to-moderate three-vessel coronary atherosclerosis. No pericardial effusion. Mild to moderate atherosclerosis involving the thoracic and proximal abdominal aorta without evidence of aneurysm, maximum ascending thoracic aortic diameter 3.8 cm. Bovine aortic arch anatomy (LEFT common carotid artery arises from the innominate artery). Mediastinum/Nodes: No pathologically enlarged mediastinal, hilar or axillary lymph nodes. No mediastinal masses. Normal-appearing esophagus. Normal-appearing thyroid gland. Lungs/Pleura: Pulmonary parenchyma clear without localized airspace consolidation,  interstitial disease, or parenchymal nodules or masses. Central airways patent with mild bronchial wall thickening. No pleural effusions. No pleural plaques or masses. Upper Abdomen: Unremarkable for the unenhanced technique. Musculoskeletal: Mild degenerative disc disease and spondylosis involving the UPPER and midthoracic spine. Severe degenerative disc disease and spondylosis involving the LOWER thoracic spine. Mildly displaced subacute fracture involving the RIGHT LATERAL fourth and fifth ribs and nondisplaced subacute fractures involving the RIGHT LATERAL third and sixth ribs. IMPRESSION: 1. No acute cardiopulmonary disease. 2. Mildly displaced subacute fractures involving the RIGHT lateral fourth and fifth ribs and nondisplaced subacute fractures involving the RIGHT lateral third and sixth ribs. 3. Mild cardiomegaly. Mild-to-moderate three-vessel coronary atherosclerosis. Aortic Atherosclerosis (ICD10-I70.0). Electronically Signed   By: Evangeline Dakin M.D.   On: 12/10/2018 20:47   Dg Chest Port 1 View  Result Date: 12/10/2018 CLINICAL DATA:  Shortness of breath for the past 3-4 weeks. Pain in the right clavicular region following a fall from a  deck. Ex-smoker. EXAM: PORTABLE CHEST 1 VIEW COMPARISON:  01/28/2018. FINDINGS: Normal sized heart. Clear lungs. The lungs are mildly hyperexpanded with mild peribronchial thickening. Thoracic spine degenerative changes. IMPRESSION: No acute abnormality. Mild changes of COPD and chronic bronchitis. Electronically Signed   By: Claudie Revering M.D.   On: 12/10/2018 17:36        Scheduled Meds:  ALPRAZolam  1 mg Oral QHS   amLODipine  10 mg Oral Q breakfast   aspirin EC  81 mg Oral Q breakfast   atorvastatin  40 mg Oral Q supper   azithromycin  250 mg Oral QHS   carvedilol  25 mg Oral BID WC   cholecalciferol  1,000 Units Oral Q supper   enoxaparin (LOVENOX) injection  40 mg Subcutaneous Daily   folic acid  1 mg Oral Daily   furosemide  40 mg  Oral BID WC   gabapentin  300 mg Oral TID   insulin aspart  0-5 Units Subcutaneous QHS   insulin aspart  0-9 Units Subcutaneous TID WC   ipratropium-albuterol  3 mL Nebulization TID   lisinopril  40 mg Oral Q breakfast   LORazepam  0-4 mg Intravenous Q6H   Followed by   Derrill Memo ON 12/13/2018] LORazepam  0-4 mg Intravenous Q12H   magnesium oxide  400 mg Oral Q supper   methylPREDNISolone (SOLU-MEDROL) injection  60 mg Intravenous Q8H   mometasone-formoterol  2 puff Inhalation BID   multivitamin with minerals  1 tablet Oral Q lunch   multivitamin with minerals  1 tablet Oral Daily   pantoprazole  40 mg Oral Daily   sertraline  150 mg Oral Q breakfast   thiamine  100 mg Oral Daily   Or   thiamine  100 mg Intravenous Daily   vitamin B-12  1,000 mcg Oral Q supper   Continuous Infusions:   LOS: 0 days    Time spent: 35 minutes.     Elmarie Shiley, MD [facility] Pager [phone]  If 7PM-7AM, please contact night-coverage [email or web address] Password [facility] 12/11/2018, 2:34 PM

## 2018-12-12 DIAGNOSIS — J9601 Acute respiratory failure with hypoxia: Secondary | ICD-10-CM

## 2018-12-12 DIAGNOSIS — E1065 Type 1 diabetes mellitus with hyperglycemia: Secondary | ICD-10-CM

## 2018-12-12 DIAGNOSIS — E876 Hypokalemia: Secondary | ICD-10-CM

## 2018-12-12 DIAGNOSIS — J441 Chronic obstructive pulmonary disease with (acute) exacerbation: Secondary | ICD-10-CM | POA: Diagnosis present

## 2018-12-12 LAB — GLUCOSE, CAPILLARY
Glucose-Capillary: 171 mg/dL — ABNORMAL HIGH (ref 70–99)
Glucose-Capillary: 169 mg/dL — ABNORMAL HIGH (ref 70–99)
Glucose-Capillary: 226 mg/dL — ABNORMAL HIGH (ref 70–99)

## 2018-12-12 MED ORDER — SPIRIVA HANDIHALER 18 MCG IN CAPS: 18.0000 ug | ORAL_CAPSULE | Freq: Every day | RESPIRATORY_TRACT | 2 refills | Status: DC

## 2018-12-12 MED ORDER — PREDNISONE 20 MG PO TABS: 20.0000 mg | ORAL_TABLET | Freq: Every day | ORAL | 0 refills | Status: AC

## 2018-12-12 MED ORDER — ENSURE MAX PROTEIN PO LIQD: 11.0000 [oz_av] | Freq: Two times a day (BID) | ORAL | 20 refills | Status: DC

## 2018-12-12 MED ORDER — ENSURE MAX PROTEIN PO LIQD
11.0000 [oz_av] | Freq: Two times a day (BID) | ORAL | Status: DC
  Administered 2018-12-12: 11 [oz_av] via ORAL
  Filled 2018-12-12 (×2): qty 330

## 2018-12-12 MED ORDER — IPRATROPIUM-ALBUTEROL 0.5-2.5 (3) MG/3ML IN SOLN: 3.0000 mL | Freq: Two times a day (BID) | RESPIRATORY_TRACT | Status: DC

## 2018-12-12 MED ORDER — AZITHROMYCIN 250 MG PO TABS: 250.0000 mg | ORAL_TABLET | Freq: Every day | ORAL | 0 refills | Status: AC

## 2018-12-12 MED ORDER — ADULT MULTIVITAMIN W/MINERALS CH: 1.0000 | ORAL_TABLET | Freq: Every day | ORAL | Status: DC

## 2018-12-12 NOTE — Progress Notes (Signed)
Inpatient Diabetes Program Recommendations  AACE/ADA: New Consensus Statement on Inpatient Glycemic Control  Target Ranges:  Prepandial:   less than 140 mg/dL      Peak postprandial:   less than 180 mg/dL (1-2 hours)      Critically ill patients:  140 - 180 mg/dL   Results for TAVARIS, EUDY (MRN [id number]) as of 12/12/2018 10:50  Ref. Range 12/11/2018 07:18 12/11/2018 11:55 12/11/2018 16:01 12/11/2018 21:01 12/12/2018 07:26  Glucose-Capillary Latest Ref Range: 70 - 99 mg/dL 142 (H) 204 (H) 242 (H) 234 (H) 171 (H)  Results for ERVING, SASSANO (MRN [id number]) as of 12/12/2018 10:50  Ref. Range 12/30/2017 08:18  Hemoglobin A1C Latest Ref Range: 4.8 - 5.6 % 5.4   Review of Glycemic Control  Diabetes history: DM2 Outpatient Diabetes medications: Metformin 500 mg QAM Current orders for Inpatient glycemic control: Novolog 0-9 units TID with meals, Novolog 0-5 units QHS; Solumedrol 60 mg Q8H  Inpatient Diabetes Program Recommendations:   Insulin - Meal Coverage: If steroids are continued, please consider ordering Novolog 4 units TID with meals for meal coverage if patient eats at least 50% of meals.  HbgA1C: Please consider ordering an A1C to evaluate glycemic control over the past 2-3 months.  Thanks, Barnie Alderman, RN, MSN, CDE Diabetes Coordinator Inpatient Diabetes Program [phone] (Team Pager from 8am to 5pm)

## 2018-12-12 NOTE — Discharge Instructions (Signed)
Chronic Obstructive Pulmonary Disease °Chronic obstructive pulmonary disease (COPD) is a long-term (chronic) lung problem. When you have COPD, it is hard for air to get in and out of your lungs. Usually the condition gets worse over time, and your lungs will never return to normal. There are things you can do to keep yourself as healthy as possible. °· Your doctor may treat your condition with: °? Medicines. °? Oxygen. °? Lung surgery. °· Your doctor may also recommend: °? Rehabilitation. This includes steps to make your body work better. It may involve a team of specialists. °? Quitting smoking, if you smoke. °? Exercise and changes to your diet. °? Comfort measures (palliative care). °Follow these instructions at home: °Medicines °· Take over-the-counter and prescription medicines only as told by your doctor. °· Talk to your doctor before taking any cough or allergy medicines. You may need to avoid medicines that cause your lungs to be dry. °Lifestyle °· If you smoke, stop. Smoking makes the problem worse. If you need help quitting, ask your doctor. °· Avoid being around things that make your breathing worse. This may include smoke, chemicals, and fumes. °· Stay active, but remember to rest as well. °· Learn and use tips on how to relax. °· Make sure you get enough sleep. Most adults need at least 7 hours of sleep every night. °· Eat healthy foods. Eat smaller meals more often. Rest before meals. °Controlled breathing °Learn and use tips on how to control your breathing as told by your doctor. Try: °· Breathing in (inhaling) through your nose for 1 second. Then, pucker your lips and breath out (exhale) through your lips for 2 seconds. °· Putting one hand on your belly (abdomen). Breathe in slowly through your nose for 1 second. Your hand on your belly should move out. Pucker your lips and breathe out slowly through your lips. Your hand on your belly should move in as you breathe out. ° °Controlled coughing °Learn  and use controlled coughing to clear mucus from your lungs. Follow these steps: °1. Lean your head a little forward. °2. Breathe in deeply. °3. Try to hold your breath for 3 seconds. °4. Keep your mouth slightly open while coughing 2 times. °5. Spit any mucus out into a tissue. °6. Rest and do the steps again 1 or 2 times as needed. °General instructions °· Make sure you get all the shots (vaccines) that your doctor recommends. Ask your doctor about a flu shot and a pneumonia shot. °· Use oxygen therapy and pulmonary rehabilitation if told by your doctor. If you need home oxygen therapy, ask your doctor if you should buy a tool to measure your oxygen level (oximeter). °· Make a COPD action plan with your doctor. This helps you to know what to do if you feel worse than usual. °· Manage any other conditions you have as told by your doctor. °· Avoid going outside when it is very hot, cold, or humid. °· Avoid people who have a sickness you can catch (contagious). °· Keep all follow-up visits as told by your doctor. This is important. °Contact a doctor if: °· You cough up more mucus than usual. °· There is a change in the color or thickness of the mucus. °· It is harder to breathe than usual. °· Your breathing is faster than usual. °· You have trouble sleeping. °· You need to use your medicines more often than usual. °· You have trouble doing your normal activities such as getting dressed   or walking around the house. °Get help right away if: °· You have shortness of breath while resting. °· You have shortness of breath that stops you from: °? Being able to talk. °? Doing normal activities. °· Your chest hurts for longer than 5 minutes. °· Your skin color is more blue than usual. °· Your pulse oximeter shows that you have low oxygen for longer than 5 minutes. °· You have a fever. °· You feel too tired to breathe normally. °Summary °· Chronic obstructive pulmonary disease (COPD) is a long-term lung problem. °· The way your  lungs work will never return to normal. Usually the condition gets worse over time. There are things you can do to keep yourself as healthy as possible. °· Take over-the-counter and prescription medicines only as told by your doctor. °· If you smoke, stop. Smoking makes the problem worse. °This information is not intended to replace advice given to you by your health care provider. Make sure you discuss any questions you have with your health care provider. °Document Released: 10/26/2007 Document Revised: 04/21/2017 Document Reviewed: 06/13/2016 °Elsevier Patient Education © 2020 [employer] ° °

## 2018-12-12 NOTE — Progress Notes (Signed)
Initial Nutrition Assessment  DOCUMENTATION CODES:   Obesity unspecified  INTERVENTION:   -Provide Ensure MAX Protein po BID, each supplement provides 150 kcal and 30 grams of protein  NUTRITION DIAGNOSIS:   Increased nutrient needs related to chronic illness as evidenced by estimated needs.  GOAL:   Patient will meet greater than or equal to 90% of their needs  MONITOR:   PO intake, Supplement acceptance, Labs, Weight trends, I & O's  REASON FOR ASSESSMENT:   Malnutrition Screening Tool    ASSESSMENT:   68 year old with past medical history significant for hypertension, hyperlipidemia, COPD, GERD, depression with anxiety, CHF, alcohol abuse, OSA on CPAP, prostate cancer status post radiation therapy who presents with shortness of breath cough recent fall and right flank pain.  **RD working remotely**  Patient reports having poor appetite PTA. Pt fell a few weeks ago and has since had SOB. Pt has also been drinking ETOH (~18 drinks per week per chart). No PO documented in chart. Yesterday pt had an omelet for breakfast and a [place] sandwich for lunch. Would benefit from protein supplements, will order Ensure Max.  Per weight records, no weight loss noted.  Medications: Vitamin D tablet daily, Folic acid tablet daily, Lasix tablet BID, MAG-OX tablet daily, Multivitamin with minerals daily, Thiamine tablet daily, Vitamin B-12 tablet daily Labs reviewed: CBGs: 171-234  NUTRITION - FOCUSED PHYSICAL EXAM:  Unable to perform -working remotely.  Diet Order:   Diet Order            Diet heart healthy/carb modified Room service appropriate? Yes; Fluid consistency: Thin  Diet effective now              EDUCATION NEEDS:   No education needs have been identified at this time  Skin:  Skin Assessment: Reviewed RN Assessment  Last BM:  7/22  Height:   Ht Readings from Last 1 Encounters:  12/10/18 5\' 10"  (1.778 m)    Weight:   Wt Readings from Last 1 Encounters:   12/12/18 127.7 kg    Ideal Body Weight:  75.5 kg  BMI:  Body mass index is 40.39 kg/m.  Estimated Nutritional Needs:   Kcal:  2400-2600  Protein:  105-115g  Fluid:  2.2L/day  Clayton Bibles, MS, RD, LDN [place] Diet[facility] Pager: [phone] After Hours Pager: [phone]

## 2018-12-12 NOTE — Progress Notes (Signed)
Occupational Therapy Treatment Patient Details Name: Francis Cardenas MRN: [id number] DOB: 07-15-1950 Today's Date: 12/12/2018    History of present illness Pt adm with copd exacerbation and recent fall with rt rib fx's 3-6. PMH - chf, htn, dm, etoh abuse, obesity   OT comments  OT provided education regarding energy conservation for self care and IADL tasks. Pt given handout as well to read when able. OT educated and demonstrated B UE strengthening exercises with use of level 2 theraband. Pt returning demonstrations with min verbal cuing for correct technique. Pt performed 2 sets of 10 chest pulls, shoulder elevation, shoulder diagonals, bicep curls, and alternating punches. Pt continues to benefit from OT intervention.   Follow Up Recommendations  No OT follow up;Supervision/Assistance - 24 hour    Equipment Recommendations  None recommended by OT       Precautions / Restrictions Precautions Precautions: Fall Restrictions Weight Bearing Restrictions: No       Mobility Bed Mobility      General bed mobility comments: Pt up in chair  Transfers Overall transfer level: Needs assistance Equipment used: None Transfers: Sit to/from Stand Sit to Stand: Supervision         General transfer comment: supervision for safety    Balance Overall balance assessment: Needs assistance Sitting-balance support: No upper extremity supported;Feet supported Sitting balance-Leahy Scale: Good        ADL either performed or assessed with clinical judgement        Vision Baseline Vision/History: Wears glasses Wears Glasses: At all times Patient Visual Report: No change from baseline            Cognition Arousal/Alertness: Awake/alert Behavior During Therapy: WFL for tasks assessed/performed Overall Cognitive Status: Within Functional Limits for tasks assessed                      Pertinent Vitals/ Pain       Pain Assessment: No/denies pain Pain Score: 0-No pain         Frequency  Min 2X/week        Progress Toward Goals  OT Goals(current goals can now be found in the care plan section)  Progress towards OT goals: Progressing toward goals  Acute Rehab OT Goals Patient Stated Goal: return home OT Goal Formulation: With patient Time For Goal Achievement: 12/25/18 Potential to Achieve Goals: Good  Plan Discharge plan remains appropriate       AM-PAC OT "6 Clicks" Daily Activity     Outcome Measure   Help from another person eating meals?: None Help from another person taking care of personal grooming?: A Little Help from another person toileting, which includes using toliet, bedpan, or urinal?: A Little Help from another person bathing (including washing, rinsing, drying)?: A Little Help from another person to put on and taking off regular upper body clothing?: None Help from another person to put on and taking off regular lower body clothing?: A Little 6 Click Score: 20    End of Session    OT Visit Diagnosis: Muscle weakness (generalized) (M62.81);History of falling (Z91.81)   Activity Tolerance Patient tolerated treatment well   Patient Left in chair;with call bell/phone within reach;with chair alarm set           Time: 1352-1413 OT Time Calculation (min): 21 min  Charges: OT General Charges $OT Visit: 1 Visit OT Treatments $Therapeutic Exercise: 8-22 mins    Zadyn Yardley P, MS, OTR/L 12/12/2018, 2:25 PM

## 2018-12-12 NOTE — Progress Notes (Signed)
Physical Therapy Treatment Patient Details Name: Francis Cardenas MRN: [id number] DOB: 03/25/1951 Today's Date: 12/12/2018    History of Present Illness Pt adm with copd exacerbation and recent fall with rt rib fx's 3-6. PMH - chf, htn, dm, etoh abuse, obesity    PT Comments    Continuing work on functional mobility and activity tolerance;  Much improved activity tolerance, and O2 sats stayed at safe, acceptable levels with amb on room air; [name] did describe a history of falls, and I updated the PT plan to include HHPT for balance dysfunction   Follow Up Recommendations  Home health PT     Equipment Recommendations  None recommended by PT    Recommendations for Other Services       Precautions / Restrictions Precautions Precautions: Fall Precaution Comments: History of falls Restrictions Weight Bearing Restrictions: No    Mobility  Bed Mobility               General bed mobility comments: Pt up in chair  Transfers Overall transfer level: Needs assistance Equipment used: None Transfers: Sit to/from Stand Sit to Stand: Supervision         General transfer comment: supervision for safety  Ambulation/Gait Ambulation/Gait assistance: Min guard(without physical contact) Gait Distance (Feet): 250 Feet Assistive device: None(occasionally pushing dinamap) Gait Pattern/deviations: Step-through pattern;Decreased stride length;Drifts right/left Gait velocity: decr   General Gait Details: Assist for safety. Pt drifts with gait but no overt loss of balance. Amb on RA with O2 sats greater than or equal to 92%   Stairs             Wheelchair Mobility    Modified Rankin (Stroke Patients Only)       Balance Overall balance assessment: Needs assistance Sitting-balance support: No upper extremity supported;Feet supported Sitting balance-Leahy Scale: Good                                      Cognition Arousal/Alertness:  Awake/alert Behavior During Therapy: WFL for tasks assessed/performed Overall Cognitive Status: Within Functional Limits for tasks assessed                                        Exercises      General Comments General comments (skin integrity, edema, etc.): See other PT note of this date      Pertinent Vitals/Pain Pain Assessment: Faces Pain Score: 0-No pain Faces Pain Scale: Hurts little more Pain Location: rt ribs/flank pain with cough Pain Descriptors / Indicators: Sore;Aching Pain Intervention(s): Monitored during session    Home Living                      Prior Function            PT Goals (current goals can now be found in the care plan section) Acute Rehab PT Goals Patient Stated Goal: return home PT Goal Formulation: With patient Time For Goal Achievement: 12/25/18 Potential to Achieve Goals: Good Progress towards PT goals: Progressing toward goals    Frequency    Min 3X/week      PT Plan Discharge plan needs to be updated    Co-evaluation              AM-PAC PT "6 Clicks" Mobility   Outcome Measure  Help needed turning from your back to your side while in a flat bed without using bedrails?: None Help needed moving from lying on your back to sitting on the side of a flat bed without using bedrails?: None Help needed moving to and from a bed to a chair (including a wheelchair)?: A Little Help needed standing up from a chair using your arms (e.g., wheelchair or bedside chair)?: A Little Help needed to walk in hospital room?: A Little Help needed climbing 3-5 steps with a railing? : A Little 6 Click Score: 20    End of Session   Activity Tolerance: Patient tolerated treatment well Patient left: in chair;with call bell/phone within reach Nurse Communication: Mobility status PT Visit Diagnosis: Unsteadiness on feet (R26.81)     Time: 6886-4847 PT Time Calculation (min) (ACUTE ONLY): 24 min  Charges:  $Gait  Training: 23-37 mins                     Roney Marion, [place] Pager [phone] Office [place] 12/12/2018, 3:33 PM

## 2018-12-12 NOTE — Progress Notes (Signed)
Physical Therapy Treatment Note  SATURATION QUALIFICATIONS: (This note is used to comply with regulatory documentation for home oxygen)  Patient Saturations on Room Air at Rest = 97-98%  Patient Saturations on Room Air while Ambulating = 92%  With today's walk, Francis Cardenas did not demonstrate the need for supplemental O2 to keep O2 sats at acceptable levels;   Full PT note to follow,   Roney Marion, [place] Pager [phone] Office [phone]

## 2018-12-12 NOTE — TOC Transition Note (Signed)
Transition of Care [facility]) - CM/SW Discharge Note   Patient Details  Name: Francis Cardenas MRN: [id number] Date of Birth: [birth year]  [facility] CM/SW Contact:  [name], RN Phone Number: 12/12/2018, 3:33 PM   Clinical Narrative:    From home with wife, for dc to day, with HHPT, NCM offered choice, he chose [place], referral made to [facility], he states he can take , soc will start 24-48 hrs post dc, patient request soc for Monday.     Final next level of care: [place] Barriers to Discharge: No Barriers Identified   Patient Goals and CMS Choice Patient states their goals for this hospitalization and ongoing recovery are:: to get better CMS Medicare.gov Compare Post Acute Care list provided to:: Patient Choice offered to / list presented to : Patient  Discharge Placement                       Discharge Plan and Services                DME Arranged: (NA)         HH Arranged: PT HH Agency: [facility] Agency Contacted: 12/12/18 Time [place]: 1157 Representative spoke with at [place]: cory  Social Determinants of Health ([place]) Interventions     Readmission Risk Interventions No flowsheet data found.

## 2018-12-12 NOTE — Discharge Summary (Signed)
Physician Discharge Summary  Francis Cardenas XTG:[id number] DOB: 1950/06/17 DOA: 12/10/2018  PCP: [facility] Admit date: 12/10/2018 Discharge date: 12/12/2018  Admitted From: Home Disposition: Home  Recommendations for Outpatient Follow-up:  1. Follow up with PCP in 1-2 weeks 2. Patient will be discharged on oral prednisone taper over the next 12 days and complete 5-day course of azithromycin after 7/24.  Home Health: PT Equipment/Devices: Has nighttime home CPAP  Discharge Condition: Fair CODE STATUS: Full code Diet recommendation: Heart Healthy/carb modified   Discharge Diagnoses:  Principal Problem:   Acute respiratory failure with hypoxia ([place])  Active Problems:    COPD with acute exacerbation (HCC)   Alcohol abuse   Benign essential hypertension   Type 2 diabetes mellitus with other specified complication (HCC)   Chronic diastolic CHF (congestive heart failure) (HCC)   Fall   Multiple rib fractures   OSA on CPAP   Hypokalemia   Obesity, Class III, BMI 40-49.9 (morbid obesity) ([place])   Brief narrative/HPI Please refer to admission H&P for details, in brief, 68 year old morbidly obese male with history of hypertension, hyperlipidemia, COPD not on home O2, OSA on nighttime CPAP, diastolic CHF, history of alcohol abuse, GERD, depression with anxiety, post cancer surgical radiation therapy and  Diabetes mellitus not on any meds presented with increasing shortness of breath for past few days associated with cough and recent fall at home with right flank pain. Patient had a fall on his porch about 3 weeks back and injury to right rib cage and since then was having sharp pain.  He then started having shortness of breath worsened for past 3 days prior to hospitalization. In the ED he required 2 L via nasal cannula.  CT of the chest showed right lateral rib fracture.  Potassium of 3.4.  Admitted for acute respiratory failure secondary to COPD exacerbation.   Hospital  course Acute respiratory failure with hypoxia (HCC)  COPD with acute exacerbation Placed on O2 via nasal cannula (2 L), IV Solu-Medrol 80 mg every 8 hours, scheduled duo nebs, antitussives and Z-Pak. Symptoms much improved today and feels >60% baseline.  Was able to ambulate maintaining O2 sat on room air >92%. Patient tested negative for SARS coronavirus..  Patient will be discharged on oral prednisone taper over the next 12 days, complete 5-day course of Z-Pak.  He will resume his home albuterol inhaler as needed and Symbicort.  I have also added daily Spiriva inhaler. Patient reports that he follows at [facility] and he started to schedule an appointment in 2 weeks  Alcohol abuse Counseled on cessation.  No issues.  Chronic diastolic CHF Euvolemic.  Continue home dose Lasix  Morbid obesity (BMI 40.39 kg/m Counseled on weight loss and exercise Nutrition supplement added  Type 2 diabetes mellitus with hyperglycemia CBG elevated due to steroid.  Last A1c was 5.4 about 10 months back.  Continue metformin  Essential hypertension Stable.  Continue Coreg, lisinopril and Norvasc  Fall with multiple rib fracture Seen by PT.  Recommend home health  Hypokalemia Replenished.  Continue potassium supplement  Procedure: CT chest  Family communication: Discussed with wife on the phone  Disposition: Home   Discharge Instructions   Allergies as of 12/12/2018   No Known Allergies     Medication List    TAKE these medications   albuterol 108 (90 Base) MCG/ACT inhaler Commonly known as: VENTOLIN HFA Inhale 2 puffs into the lungs every 6 (six) hours as needed for wheezing.   ALPRAZolam 1 MG  tablet Commonly known as: XANAX Take 1 mg by mouth at bedtime.   amLODipine 10 MG tablet Commonly known as: NORVASC Take 10 mg by mouth daily with breakfast.   aspirin EC 81 MG tablet Take 81 mg by mouth daily with breakfast.   atorvastatin 80 MG tablet Commonly known as:  LIPITOR Take 40 mg by mouth daily with supper.   azithromycin 250 MG tablet Commonly known as: Zithromax Z-Pak Take 1 tablet (250 mg total) by mouth daily for 3 days. Take as directed   budesonide-formoterol 80-4.5 MCG/ACT inhaler Commonly known as: SYMBICORT Inhale 2 puffs into the lungs 2 (two) times daily.   Carboxymethylcellulose Sodium 0.25 % Soln Place 1 drop into both eyes 4 (four) times daily as needed (dry eyes/ irritation).   carvedilol 25 MG tablet Commonly known as: COREG Take 1 tablet (25 mg total) by mouth 2 (two) times daily with a meal. For hypertension.   cholecalciferol 1000 units tablet Commonly known as: VITAMIN D Take 1,000 Units by mouth daily with supper.   diclofenac sodium 1 % Gel Commonly known as: VOLTAREN Apply 1 application topically 2 (two) times daily as needed (pain).   Ensure Max Protein Liqd Take 330 mLs (11 oz total) by mouth 2 (two) times daily.   furosemide 40 MG tablet Commonly known as: LASIX Take 1 tablet (40 mg total) by mouth 2 (two) times daily. What changed: when to take this   gabapentin 300 MG capsule Commonly known as: NEURONTIN Take 300 mg by mouth 3 (three) times daily.   lisinopril 40 MG tablet Commonly known as: ZESTRIL Take 1 tablet (40 mg total) by mouth daily. For hypertension. What changed: when to take this   magnesium oxide 400 MG tablet Commonly known as: MAG-OX Take 1 tablet (400 mg total) by mouth daily. What changed: when to take this   meloxicam 15 MG tablet Commonly known as: MOBIC Take 15 mg by mouth at bedtime.   metFORMIN 500 MG tablet Commonly known as: GLUCOPHAGE Take 500 mg by mouth daily with breakfast.   multivitamin with minerals tablet Take 1 tablet by mouth daily with lunch.   omeprazole 40 MG capsule Commonly known as: PRILOSEC Take 40 mg by mouth daily with supper.   potassium chloride SA 20 MEQ tablet Commonly known as: K-DUR Take 2 tablets (40 mEq total) by mouth daily. What  changed: when to take this   predniSONE 20 MG tablet Commonly known as: DELTASONE Take 1 tablet (20 mg total) by mouth daily with breakfast for 12 days.   PRESCRIPTION MEDICATION Inhale into the lungs at bedtime. CPAP   sertraline 100 MG tablet Commonly known as: ZOLOFT Take one pill by mouth each day for depression and anxiety. What changed:   how much to take  how to take this  when to take this  additional instructions   Spiriva HandiHaler 18 MCG inhalation capsule Generic drug: tiotropium Place 1 capsule (18 mcg total) into inhaler and inhale daily.   vitamin B-12 1000 MCG tablet Commonly known as: CYANOCOBALAMIN Take 1,000 mcg by mouth daily with supper.      Follow-up Information    Christain Sacramento, MD Follow up in 1 week(s).   Specialty: Family Medicine Contact information: [place] [address] [phone]          No Known Allergies      Procedures/Studies: Ct Chest Wo Contrast  Result Date: 12/10/2018 CLINICAL DATA:  68 year old who fell approximately 3 weeks  ago and presents now with wheezing and shortness of breath. RIGHT LATERAL chest wall bruising. Initial encounter. EXAM: CT CHEST WITHOUT CONTRAST TECHNIQUE: Multidetector CT imaging of the chest was performed following the standard protocol without IV contrast. COMPARISON:  CTA chest 12/30/2017. FINDINGS: Cardiovascular: Heart mildly enlarged. Mild-to-moderate three-vessel coronary atherosclerosis. No pericardial effusion. Mild to moderate atherosclerosis involving the thoracic and proximal abdominal aorta without evidence of aneurysm, maximum ascending thoracic aortic diameter 3.8 cm. Bovine aortic arch anatomy (LEFT common carotid artery arises from the innominate artery). Mediastinum/Nodes: No pathologically enlarged mediastinal, hilar or axillary lymph nodes. No mediastinal masses. Normal-appearing esophagus. Normal-appearing thyroid gland. Lungs/Pleura: Pulmonary  parenchyma clear without localized airspace consolidation, interstitial disease, or parenchymal nodules or masses. Central airways patent with mild bronchial wall thickening. No pleural effusions. No pleural plaques or masses. Upper Abdomen: Unremarkable for the unenhanced technique. Musculoskeletal: Mild degenerative disc disease and spondylosis involving the UPPER and midthoracic spine. Severe degenerative disc disease and spondylosis involving the LOWER thoracic spine. Mildly displaced subacute fracture involving the RIGHT LATERAL fourth and fifth ribs and nondisplaced subacute fractures involving the RIGHT LATERAL third and sixth ribs. IMPRESSION: 1. No acute cardiopulmonary disease. 2. Mildly displaced subacute fractures involving the RIGHT lateral fourth and fifth ribs and nondisplaced subacute fractures involving the RIGHT lateral third and sixth ribs. 3. Mild cardiomegaly. Mild-to-moderate three-vessel coronary atherosclerosis. Aortic Atherosclerosis (ICD10-I70.0). Electronically Signed   By: Evangeline Dakin M.D.   On: 12/10/2018 20:47   Dg Chest Port 1 View  Result Date: 12/10/2018 CLINICAL DATA:  Shortness of breath for the past 3-4 weeks. Pain in the right clavicular region following a fall from a deck. Ex-smoker. EXAM: PORTABLE CHEST 1 VIEW COMPARISON:  01/28/2018. FINDINGS: Normal sized heart. Clear lungs. The lungs are mildly hyperexpanded with mild peribronchial thickening. Thoracic spine degenerative changes. IMPRESSION: No acute abnormality. Mild changes of COPD and chronic bronchitis. Electronically Signed   By: Claudie Revering M.D.   On: 12/10/2018 17:36       Subjective: Reports breathing much better today.  Discharge Exam: Vitals:   12/12/18 0728 12/12/18 0810  BP: 134/65   Pulse: (!) 54   Resp:    Temp: 97.7 F (36.5 C)   SpO2: 98% 98%   Vitals:   12/12/18 0418 12/12/18 0633 12/12/18 0728 12/12/18 0810  BP:  (!) 143/80 134/65   Pulse:  65 (!) 54   Resp:      Temp:    97.7 F (36.5 C)   TempSrc:   Oral   SpO2:   98% 98%  Weight: 127.7 kg     Height:        General: Elderly obese male not in distress HEENT: Moist mucosa, supple neck Chest: Clear bilaterally CVs: Normal S1-S2, no murmurs GI: Soft, nondistended, nontender Musculoskeletal: Warm, no edema     The results of significant diagnostics from this hospitalization (including imaging, microbiology, ancillary and laboratory) are listed below for reference.     Microbiology: Recent Results (from the past 240 hour(s))  SARS Coronavirus 2 (CEPHEID- Performed in [place] hospital lab), Hosp Order     Status: None   Collection Time: 12/10/18  6:12 PM   Specimen: Nasopharyngeal Swab  Result Value Ref Range Status   SARS Coronavirus 2 NEGATIVE NEGATIVE Final    Comment: (NOTE) If result is NEGATIVE SARS-CoV-2 target nucleic acids are NOT DETECTED. The SARS-CoV-2 RNA is generally detectable in upper and lower  respiratory specimens during the acute phase of infection. The lowest  concentration of SARS-CoV-2 viral copies this assay can detect is 250  copies / mL. A negative result does not preclude SARS-CoV-2 infection  and should not be used as the sole basis for treatment or other  patient management decisions.  A negative result may occur with  improper specimen collection / handling, submission of specimen other  than nasopharyngeal swab, presence of viral mutation(s) within the  areas targeted by this assay, and inadequate number of viral copies  (<250 copies / mL). A negative result must be combined with clinical  observations, patient history, and epidemiological information. If result is POSITIVE SARS-CoV-2 target nucleic acids are DETECTED. The SARS-CoV-2 RNA is generally detectable in upper and lower  respiratory specimens dur ing the acute phase of infection.  Positive  results are indicative of active infection with SARS-CoV-2.  Clinical  correlation with patient history and  other diagnostic information is  necessary to determine patient infection status.  Positive results do  not rule out bacterial infection or co-infection with other viruses. If result is PRESUMPTIVE POSTIVE SARS-CoV-2 nucleic acids MAY BE PRESENT.   A presumptive positive result was obtained on the submitted specimen  and confirmed on repeat testing.  While 2019 novel coronavirus  (SARS-CoV-2) nucleic acids may be present in the submitted sample  additional confirmatory testing may be necessary for epidemiological  and / or clinical management purposes  to differentiate between  SARS-CoV-2 and other Sarbecovirus currently known to infect humans.  If clinically indicated additional testing with an alternate test  methodology [phone]) is advised. The SARS-CoV-2 RNA is generally  detectable in upper and lower respiratory sp ecimens during the acute  phase of infection. The expected result is Negative. Fact Sheet for Patients:  StrictlyIdeas.no Fact Sheet for Healthcare Providers: BankingDealers.co.za This test is not yet approved or cleared by the [place] FDA and has been authorized for detection and/or diagnosis of SARS-CoV-2 by FDA under an Emergency Use Authorization (EUA).  This EUA will remain in effect (meaning this test can be used) for the duration of the COVID-19 declaration under Section 564(b)(1) of the Act, 21 U.S.C. section 360bbb-3(b)(1), unless the authorization is terminated or revoked sooner. Performed at [facility], [place] [address] [place],  [address]   Respiratory Panel by PCR     Status: None   Collection Time: 12/11/18  6:28 PM   Specimen: Nasopharyngeal Swab; Respiratory  Result Value Ref Range Status   Adenovirus NOT DETECTED NOT DETECTED Final   Coronavirus 229E NOT DETECTED NOT DETECTED Final    Comment: (NOTE) The Coronavirus on the Respiratory Panel, DOES NOT test for the novel   Coronavirus (2019 nCoV)    Coronavirus HKU1 NOT DETECTED NOT DETECTED Final   Coronavirus NL63 NOT DETECTED NOT DETECTED Final   Coronavirus OC43 NOT DETECTED NOT DETECTED Final   Metapneumovirus NOT DETECTED NOT DETECTED Final   Rhinovirus / Enterovirus NOT DETECTED NOT DETECTED Final   Influenza A NOT DETECTED NOT DETECTED Final   Influenza B NOT DETECTED NOT DETECTED Final   Parainfluenza Virus 1 NOT DETECTED NOT DETECTED Final   Parainfluenza Virus 2 NOT DETECTED NOT DETECTED Final   Parainfluenza Virus 3 NOT DETECTED NOT DETECTED Final   Parainfluenza Virus 4 NOT DETECTED NOT DETECTED Final   Respiratory Syncytial Virus NOT DETECTED NOT DETECTED Final   Bordetella pertussis NOT DETECTED NOT DETECTED Final   Chlamydophila pneumoniae NOT DETECTED NOT DETECTED Final   Mycoplasma pneumoniae NOT DETECTED NOT DETECTED Final  Comment: Performed at [facility], [place] [address] [place], [place] [address]     Labs: BNP (last 3 results) Recent Labs    12/30/17 0448 01/28/18 0804 12/10/18 1702  BNP 258.5* 562.9* 825.0*   Basic Metabolic Panel: Recent Labs  Lab 12/10/18 1702 12/10/18 1813 12/11/18 0011 12/11/18 1719  NA 137 137  --  135  K 3.4* 3.4*  --  4.0  CL 102  --   --  102  CO2 23  --   --  24  GLUCOSE 90  --   --  243*  BUN 6*  --   --  16  CREATININE 0.75  --   --  0.89  CALCIUM 10.3  --   --  11.0*  MG  --   --  2.1  --    Liver Function Tests: Recent Labs  Lab 12/10/18 1702  AST 48*  ALT 48*  ALKPHOS 83  BILITOT 0.7  PROT 7.1  ALBUMIN 4.3   No results for input(s): LIPASE, AMYLASE in the last 168 hours. No results for input(s): AMMONIA in the last 168 hours. CBC: Recent Labs  Lab 12/10/18 1702 12/10/18 1813  WBC 5.7  --   NEUTROABS 3.8  --   HGB 14.4 14.3  HCT 41.9 42.0  MCV 92.9  --   PLT 149*  --    Cardiac Enzymes: No results for input(s): CKTOTAL, CKMB, CKMBINDEX, TROPONINI in the last 168 hours. BNP: Invalid input(s):  POCBNP CBG: Recent Labs  Lab 12/11/18 1155 12/11/18 1601 12/11/18 2101 12/12/18 0726 12/12/18 1230  GLUCAP 204* 242* 234* 171* 169*   D-Dimer No results for input(s): DDIMER in the last 72 hours. Hgb A1c No results for input(s): HGBA1C in the last 72 hours. Lipid Profile No results for input(s): CHOL, HDL, LDLCALC, TRIG, CHOLHDL, LDLDIRECT in the last 72 hours. Thyroid function studies No results for input(s): TSH, T4TOTAL, T3FREE, THYROIDAB in the last 72 hours.  Invalid input(s): FREET3 Anemia work up No results for input(s): VITAMINB12, FOLATE, FERRITIN, TIBC, IRON, RETICCTPCT in the last 72 hours. Urinalysis    Component Value Date/Time   COLORURINE STRAW (A) 12/10/2018 1717   APPEARANCEUR CLEAR 12/10/2018 1717   LABSPEC 1.002 (L) 12/10/2018 1717   PHURINE 6.0 12/10/2018 1717   GLUCOSEU NEGATIVE 12/10/2018 1717   HGBUR NEGATIVE 12/10/2018 1717   BILIRUBINUR NEGATIVE 12/10/2018 1717   KETONESUR NEGATIVE 12/10/2018 1717   PROTEINUR NEGATIVE 12/10/2018 1717   UROBILINOGEN 0.2 03/24/2013 1255   NITRITE NEGATIVE 12/10/2018 1717   LEUKOCYTESUR NEGATIVE 12/10/2018 1717   Sepsis Labs Invalid input(s): PROCALCITONIN,  WBC,  LACTICIDVEN Microbiology Recent Results (from the past 240 hour(s))  SARS Coronavirus 2 (CEPHEID- Performed in [place] hospital lab), Hosp Order     Status: None   Collection Time: 12/10/18  6:12 PM   Specimen: Nasopharyngeal Swab  Result Value Ref Range Status   SARS Coronavirus 2 NEGATIVE NEGATIVE Final    Comment: (NOTE) If result is NEGATIVE SARS-CoV-2 target nucleic acids are NOT DETECTED. The SARS-CoV-2 RNA is generally detectable in upper and lower  respiratory specimens during the acute phase of infection. The lowest  concentration of SARS-CoV-2 viral copies this assay can detect is 250  copies / mL. A negative result does not preclude SARS-CoV-2 infection  and should not be used as the sole basis for treatment or other  patient  management decisions.  A negative result may occur with  improper specimen collection / handling,  submission of specimen other  than nasopharyngeal swab, presence of viral mutation(s) within the  areas targeted by this assay, and inadequate number of viral copies  (<250 copies / mL). A negative result must be combined with clinical  observations, patient history, and epidemiological information. If result is POSITIVE SARS-CoV-2 target nucleic acids are DETECTED. The SARS-CoV-2 RNA is generally detectable in upper and lower  respiratory specimens dur ing the acute phase of infection.  Positive  results are indicative of active infection with SARS-CoV-2.  Clinical  correlation with patient history and other diagnostic information is  necessary to determine patient infection status.  Positive results do  not rule out bacterial infection or co-infection with other viruses. If result is PRESUMPTIVE POSTIVE SARS-CoV-2 nucleic acids MAY BE PRESENT.   A presumptive positive result was obtained on the submitted specimen  and confirmed on repeat testing.  While 2019 novel coronavirus  (SARS-CoV-2) nucleic acids may be present in the submitted sample  additional confirmatory testing may be necessary for epidemiological  and / or clinical management purposes  to differentiate between  SARS-CoV-2 and other Sarbecovirus currently known to infect humans.  If clinically indicated additional testing with an alternate test  methodology [phone]) is advised. The SARS-CoV-2 RNA is generally  detectable in upper and lower respiratory sp ecimens during the acute  phase of infection. The expected result is Negative. Fact Sheet for Patients:  StrictlyIdeas.no Fact Sheet for Healthcare Providers: BankingDealers.co.za This test is not yet approved or cleared by the [place] FDA and has been authorized for detection and/or diagnosis of SARS-CoV-2 by FDA under  an Emergency Use Authorization (EUA).  This EUA will remain in effect (meaning this test can be used) for the duration of the COVID-19 declaration under Section 564(b)(1) of the Act, 21 U.S.C. section 360bbb-3(b)(1), unless the authorization is terminated or revoked sooner. Performed at [facility], [place] [address] [place], [place] [address]   Respiratory Panel by PCR     Status: None   Collection Time: 12/11/18  6:28 PM   Specimen: Nasopharyngeal Swab; Respiratory  Result Value Ref Range Status   Adenovirus NOT DETECTED NOT DETECTED Final   Coronavirus 229E NOT DETECTED NOT DETECTED Final    Comment: (NOTE) The Coronavirus on the Respiratory Panel, DOES NOT test for the novel  Coronavirus (2019 nCoV)    Coronavirus HKU1 NOT DETECTED NOT DETECTED Final   Coronavirus NL63 NOT DETECTED NOT DETECTED Final   Coronavirus OC43 NOT DETECTED NOT DETECTED Final   Metapneumovirus NOT DETECTED NOT DETECTED Final   Rhinovirus / Enterovirus NOT DETECTED NOT DETECTED Final   Influenza A NOT DETECTED NOT DETECTED Final   Influenza B NOT DETECTED NOT DETECTED Final   Parainfluenza Virus 1 NOT DETECTED NOT DETECTED Final   Parainfluenza Virus 2 NOT DETECTED NOT DETECTED Final   Parainfluenza Virus 3 NOT DETECTED NOT DETECTED Final   Parainfluenza Virus 4 NOT DETECTED NOT DETECTED Final   Respiratory Syncytial Virus NOT DETECTED NOT DETECTED Final   Bordetella pertussis NOT DETECTED NOT DETECTED Final   Chlamydophila pneumoniae NOT DETECTED NOT DETECTED Final   Mycoplasma pneumoniae NOT DETECTED NOT DETECTED Final    Comment: Performed at [facility], [place]. [address] [place], [place] [address]     Time coordinating discharge: 35 minutes  SIGNED:   Louellen Molder, MD  [facility] 12/12/2018, 2:01 PM Pager   If 7PM-7AM, please contact night-coverage [email or web address] Password [email or web address]

## 2019-03-16 ENCOUNTER — Other Ambulatory Visit: Payer: Self-pay

## 2019-03-16 ENCOUNTER — Emergency Department (HOSPITAL_COMMUNITY): Payer: No Typology Code available for payment source

## 2019-03-16 ENCOUNTER — Inpatient Hospital Stay (HOSPITAL_COMMUNITY)
Admission: EM | Admit: 2019-03-16 | Discharge: 2019-03-21 | DRG: 208 | Disposition: A | Discharge status: Home / Self Care | Payer: No Typology Code available for payment source | Attending: Internal Medicine | Admitting: Internal Medicine

## 2019-03-16 ENCOUNTER — Encounter (HOSPITAL_COMMUNITY): Payer: Self-pay | Admitting: Emergency Medicine

## 2019-03-16 DIAGNOSIS — Z79899 Other long term (current) drug therapy: Secondary | ICD-10-CM | POA: Diagnosis not present

## 2019-03-16 DIAGNOSIS — J441 Chronic obstructive pulmonary disease with (acute) exacerbation: Principal | ICD-10-CM | POA: Diagnosis present

## 2019-03-16 DIAGNOSIS — J189 Pneumonia, unspecified organism: Secondary | ICD-10-CM | POA: Diagnosis present

## 2019-03-16 DIAGNOSIS — F1020 Alcohol dependence, uncomplicated: Secondary | ICD-10-CM | POA: Diagnosis present

## 2019-03-16 DIAGNOSIS — I5043 Acute on chronic combined systolic (congestive) and diastolic (congestive) heart failure: Secondary | ICD-10-CM | POA: Diagnosis present

## 2019-03-16 DIAGNOSIS — E7800 Pure hypercholesterolemia, unspecified: Secondary | ICD-10-CM | POA: Diagnosis present

## 2019-03-16 DIAGNOSIS — Z20828 Contact with and (suspected) exposure to other viral communicable diseases: Secondary | ICD-10-CM | POA: Diagnosis present

## 2019-03-16 DIAGNOSIS — E870 Hyperosmolality and hypernatremia: Secondary | ICD-10-CM | POA: Diagnosis present

## 2019-03-16 DIAGNOSIS — F4310 Post-traumatic stress disorder, unspecified: Secondary | ICD-10-CM | POA: Diagnosis present

## 2019-03-16 DIAGNOSIS — J440 Chronic obstructive pulmonary disease with acute lower respiratory infection: Secondary | ICD-10-CM | POA: Diagnosis present

## 2019-03-16 DIAGNOSIS — I5041 Acute combined systolic (congestive) and diastolic (congestive) heart failure: Secondary | ICD-10-CM | POA: Diagnosis not present

## 2019-03-16 DIAGNOSIS — K219 Gastro-esophageal reflux disease without esophagitis: Secondary | ICD-10-CM | POA: Diagnosis present

## 2019-03-16 DIAGNOSIS — I110 Hypertensive heart disease with heart failure: Secondary | ICD-10-CM | POA: Diagnosis present

## 2019-03-16 DIAGNOSIS — Z6841 Body Mass Index (BMI) 40.0 and over, adult: Secondary | ICD-10-CM | POA: Diagnosis not present

## 2019-03-16 DIAGNOSIS — Z7982 Long term (current) use of aspirin: Secondary | ICD-10-CM | POA: Diagnosis not present

## 2019-03-16 DIAGNOSIS — E1169 Type 2 diabetes mellitus with other specified complication: Secondary | ICD-10-CM

## 2019-03-16 DIAGNOSIS — Z87891 Personal history of nicotine dependence: Secondary | ICD-10-CM | POA: Diagnosis not present

## 2019-03-16 DIAGNOSIS — D649 Anemia, unspecified: Secondary | ICD-10-CM | POA: Diagnosis present

## 2019-03-16 DIAGNOSIS — G4733 Obstructive sleep apnea (adult) (pediatric): Secondary | ICD-10-CM | POA: Diagnosis present

## 2019-03-16 DIAGNOSIS — J9601 Acute respiratory failure with hypoxia: Secondary | ICD-10-CM | POA: Diagnosis present

## 2019-03-16 DIAGNOSIS — E1165 Type 2 diabetes mellitus with hyperglycemia: Secondary | ICD-10-CM | POA: Diagnosis present

## 2019-03-16 DIAGNOSIS — J9600 Acute respiratory failure, unspecified whether with hypoxia or hypercapnia: Secondary | ICD-10-CM

## 2019-03-16 DIAGNOSIS — J9602 Acute respiratory failure with hypercapnia: Secondary | ICD-10-CM | POA: Diagnosis present

## 2019-03-16 DIAGNOSIS — Z7984 Long term (current) use of oral hypoglycemic drugs: Secondary | ICD-10-CM

## 2019-03-16 DIAGNOSIS — D696 Thrombocytopenia, unspecified: Secondary | ICD-10-CM | POA: Diagnosis present

## 2019-03-16 DIAGNOSIS — G894 Chronic pain syndrome: Secondary | ICD-10-CM | POA: Diagnosis present

## 2019-03-16 DIAGNOSIS — E1142 Type 2 diabetes mellitus with diabetic polyneuropathy: Secondary | ICD-10-CM | POA: Diagnosis present

## 2019-03-16 DIAGNOSIS — E785 Hyperlipidemia, unspecified: Secondary | ICD-10-CM | POA: Diagnosis present

## 2019-03-16 DIAGNOSIS — Z7951 Long term (current) use of inhaled steroids: Secondary | ICD-10-CM

## 2019-03-16 DIAGNOSIS — E876 Hypokalemia: Secondary | ICD-10-CM | POA: Diagnosis present

## 2019-03-16 DIAGNOSIS — I340 Nonrheumatic mitral (valve) insufficiency: Secondary | ICD-10-CM | POA: Diagnosis not present

## 2019-03-16 DIAGNOSIS — N400 Enlarged prostate without lower urinary tract symptoms: Secondary | ICD-10-CM | POA: Diagnosis present

## 2019-03-16 DIAGNOSIS — Z8546 Personal history of malignant neoplasm of prostate: Secondary | ICD-10-CM | POA: Diagnosis not present

## 2019-03-16 DIAGNOSIS — J431 Panlobular emphysema: Secondary | ICD-10-CM

## 2019-03-16 DIAGNOSIS — M7989 Other specified soft tissue disorders: Secondary | ICD-10-CM | POA: Diagnosis not present

## 2019-03-16 LAB — CBC
HCT: 40 % (ref 39.0–52.0)
Hemoglobin: 13.5 g/dL (ref 13.0–17.0)
MCH: 32.2 pg (ref 26.0–34.0)
MCHC: 33.8 g/dL (ref 30.0–36.0)
MCV: 95.5 fL (ref 80.0–100.0)
Platelets: 131 10*3/uL — ABNORMAL LOW (ref 150–400)
RBC: 4.19 MIL/uL — ABNORMAL LOW (ref 4.22–5.81)
RDW: 14.6 % (ref 11.5–15.5)
WBC: 8.5 10*3/uL (ref 4.0–10.5)
nRBC: 0 % (ref 0.0–0.2)

## 2019-03-16 LAB — COMPREHENSIVE METABOLIC PANEL
ALT: 37 U/L (ref 0–44)
AST: 38 U/L (ref 15–41)
Albumin: 4.2 g/dL (ref 3.5–5.0)
Alkaline Phosphatase: 116 U/L (ref 38–126)
Anion gap: 12 (ref 5–15)
BUN: 9 mg/dL (ref 8–23)
CO2: 24 mmol/L (ref 22–32)
Calcium: 10 mg/dL (ref 8.9–10.3)
Chloride: 104 mmol/L (ref 98–111)
Creatinine, Ser: 0.81 mg/dL (ref 0.61–1.24)
GFR calc Af Amer: >60 mL/min (ref >60)
GFR calc non Af Amer: >60 mL/min (ref >60)
Glucose, Bld: 232 mg/dL — ABNORMAL HIGH (ref 70–99)
Potassium: 3.6 mmol/L (ref 3.5–5.1)
Sodium: 140 mmol/L (ref 135–145)
Total Bilirubin: 0.5 mg/dL (ref 0.3–1.2)
Total Protein: 7.1 g/dL (ref 6.5–8.1)

## 2019-03-16 LAB — CBC WITH DIFFERENTIAL/PLATELET
Abs Immature Granulocytes: 0.11 10*3/uL — ABNORMAL HIGH (ref 0.00–0.07)
Basophils Absolute: 0 10*3/uL (ref 0.0–0.1)
Basophils Relative: 0 %
Eosinophils Absolute: 0.1 10*3/uL (ref 0.0–0.5)
Eosinophils Relative: 1 %
HCT: 42.3 % (ref 39.0–52.0)
Hemoglobin: 14 g/dL (ref 13.0–17.0)
Immature Granulocytes: 1 %
Lymphocytes Relative: 10 %
Lymphs Abs: 1.2 10*3/uL (ref 0.7–4.0)
MCH: 32.2 pg (ref 26.0–34.0)
MCHC: 33.1 g/dL (ref 30.0–36.0)
MCV: 97.2 fL (ref 80.0–100.0)
Monocytes Absolute: 0.3 10*3/uL (ref 0.1–1.0)
Monocytes Relative: 3 %
Neutro Abs: 9.6 10*3/uL — ABNORMAL HIGH (ref 1.7–7.7)
Neutrophils Relative %: 85 %
Platelets: 159 10*3/uL (ref 150–400)
RBC: 4.35 MIL/uL (ref 4.22–5.81)
RDW: 14.7 % (ref 11.5–15.5)
WBC: 11.3 10*3/uL — ABNORMAL HIGH (ref 4.0–10.5)
nRBC: 0 % (ref 0.0–0.2)

## 2019-03-16 LAB — APTT: aPTT: 26 seconds (ref 24–36)

## 2019-03-16 LAB — STREP PNEUMONIAE URINARY ANTIGEN: Strep Pneumo Urinary Antigen: NEGATIVE

## 2019-03-16 LAB — CREATININE, SERUM
Creatinine, Ser: 0.71 mg/dL (ref 0.61–1.24)
GFR calc Af Amer: >60 mL/min (ref >60)
GFR calc non Af Amer: >60 mL/min (ref >60)

## 2019-03-16 LAB — POCT I-STAT 7, (LYTES, BLD GAS, ICA,H+H)
Acid-Base Excess: 3 mmol/L — ABNORMAL HIGH (ref 0.0–2.0)
Bicarbonate: 29.8 mmol/L — ABNORMAL HIGH (ref 20.0–28.0)
Calcium, Ion: 1.32 mmol/L (ref 1.15–1.40)
HCT: 39 % (ref 39.0–52.0)
Hemoglobin: 13.3 g/dL (ref 13.0–17.0)
O2 Saturation: 100 %
Potassium: 3.6 mmol/L (ref 3.5–5.1)
Sodium: 140 mmol/L (ref 135–145)
TCO2: 31 mmol/L (ref 22–32)
pCO2 arterial: 55.7 mmHg — ABNORMAL HIGH (ref 32.0–48.0)
pH, Arterial: 7.337 — ABNORMAL LOW (ref 7.350–7.450)
pO2, Arterial: 272 mmHg — ABNORMAL HIGH (ref 83.0–108.0)

## 2019-03-16 LAB — LACTIC ACID, PLASMA: Lactic Acid, Venous: 1.7 mmol/L (ref 0.5–1.9)

## 2019-03-16 LAB — URINALYSIS, ROUTINE W REFLEX MICROSCOPIC
Bilirubin Urine: NEGATIVE
Glucose, UA: 50 mg/dL — AB
Hgb urine dipstick: NEGATIVE
Ketones, ur: NEGATIVE mg/dL
Leukocytes,Ua: NEGATIVE
Nitrite: NEGATIVE
Protein, ur: 100 mg/dL — AB
Specific Gravity, Urine: 1.005 (ref 1.005–1.030)
pH: 6 (ref 5.0–8.0)

## 2019-03-16 LAB — GLUCOSE, CAPILLARY
Glucose-Capillary: 153 mg/dL — ABNORMAL HIGH (ref 70–99)
Glucose-Capillary: 147 mg/dL — ABNORMAL HIGH (ref 70–99)

## 2019-03-16 LAB — SARS CORONAVIRUS 2 BY RT PCR (HOSPITAL ORDER, PERFORMED IN ~~LOC~~ HOSPITAL LAB): SARS Coronavirus 2: NEGATIVE

## 2019-03-16 LAB — PROTIME-INR
INR: 1 (ref 0.8–1.2)
Prothrombin Time: 13.5 seconds (ref 11.4–15.2)

## 2019-03-16 LAB — BRAIN NATRIURETIC PEPTIDE: B Natriuretic Peptide: 403.3 pg/mL — ABNORMAL HIGH (ref 0.0–100.0)

## 2019-03-16 LAB — TROPONIN I (HIGH SENSITIVITY)
Troponin I (High Sensitivity): 33 ng/L — ABNORMAL HIGH (ref <18)
Troponin I (High Sensitivity): 125 ng/L (ref <18)

## 2019-03-16 LAB — TRIGLYCERIDES: Triglycerides: 69 mg/dL (ref <150)

## 2019-03-16 LAB — MRSA PCR SCREENING: MRSA by PCR: POSITIVE — AB

## 2019-03-16 MED ORDER — SODIUM CHLORIDE 0.9 % IV SOLN
1.0000 g | INTRAVENOUS | Status: AC
  Administered 2019-03-16: 1 g via INTRAVENOUS
  Filled 2019-03-16: qty 10

## 2019-03-16 MED ORDER — ATORVASTATIN CALCIUM 40 MG PO TABS
40.0000 mg | ORAL_TABLET | Freq: Every day | ORAL | Status: DC
  Administered 2019-03-16 – 2019-03-20 (×5): 40 mg via ORAL
  Filled 2019-03-16 (×5): qty 1

## 2019-03-16 MED ORDER — SODIUM CHLORIDE 0.9 % IV SOLN
500.0000 mg | INTRAVENOUS | Status: DC
  Administered 2019-03-17 – 2019-03-18 (×2): 500 mg via INTRAVENOUS
  Filled 2019-03-16 (×2): qty 500

## 2019-03-16 MED ORDER — ASPIRIN 81 MG PO CHEW
81.0000 mg | CHEWABLE_TABLET | Freq: Every day | ORAL | Status: DC
  Administered 2019-03-16 – 2019-03-21 (×5): 81 mg
  Filled 2019-03-16 (×6): qty 1

## 2019-03-16 MED ORDER — PROPOFOL 1000 MG/100ML IV EMUL
INTRAVENOUS | Status: AC
  Filled 2019-03-16: qty 100

## 2019-03-16 MED ORDER — FENTANYL CITRATE (PF) 100 MCG/2ML IJ SOLN
INTRAMUSCULAR | Status: AC
  Filled 2019-03-16: qty 2

## 2019-03-16 MED ORDER — CHLORHEXIDINE GLUCONATE CLOTH 2 % EX PADS
6.0000 | MEDICATED_PAD | Freq: Every day | CUTANEOUS | Status: DC
  Administered 2019-03-16 – 2019-03-20 (×5): 6 via TOPICAL

## 2019-03-16 MED ORDER — PROPOFOL 1000 MG/100ML IV EMUL
INTRAVENOUS | Status: AC
  Administered 2019-03-16: 13:00:00
  Filled 2019-03-16: qty 100

## 2019-03-16 MED ORDER — IPRATROPIUM-ALBUTEROL 0.5-2.5 (3) MG/3ML IN SOLN
3.0000 mL | Freq: Once | RESPIRATORY_TRACT | Status: AC
  Administered 2019-03-16: 3 mL via RESPIRATORY_TRACT
  Filled 2019-03-16: qty 3

## 2019-03-16 MED ORDER — SODIUM CHLORIDE 0.9 % IV SOLN
1000.0000 mL | INTRAVENOUS | Status: DC
  Administered 2019-03-16 – 2019-03-17 (×4): 1000 mL via INTRAVENOUS

## 2019-03-16 MED ORDER — CARVEDILOL 25 MG PO TABS
25.0000 mg | ORAL_TABLET | Freq: Two times a day (BID) | ORAL | Status: DC
  Administered 2019-03-16 – 2019-03-18 (×4): 25 mg via ORAL
  Filled 2019-03-16 (×5): qty 1

## 2019-03-16 MED ORDER — METHYLPREDNISOLONE SODIUM SUCC 125 MG IJ SOLR
INTRAMUSCULAR | Status: AC
  Filled 2019-03-16: qty 2

## 2019-03-16 MED ORDER — ASPIRIN 300 MG RE SUPP
300.0000 mg | Freq: Once | RECTAL | Status: AC
  Administered 2019-03-16: 300 mg via RECTAL
  Filled 2019-03-16: qty 1

## 2019-03-16 MED ORDER — PRO-STAT SUGAR FREE PO LIQD
60.0000 mL | Freq: Every day | ORAL | Status: DC
  Administered 2019-03-16 – 2019-03-18 (×8): 60 mL
  Filled 2019-03-16 (×9): qty 60

## 2019-03-16 MED ORDER — SODIUM CHLORIDE 0.9 % IV SOLN
2.0000 g | INTRAVENOUS | Status: DC
  Administered 2019-03-17: 2 g via INTRAVENOUS
  Filled 2019-03-16 (×2): qty 20

## 2019-03-16 MED ORDER — PROPOFOL 1000 MG/100ML IV EMUL
INTRAVENOUS | Status: AC | PRN
  Administered 2019-03-16: 15 ug via INTRAVENOUS

## 2019-03-16 MED ORDER — AMLODIPINE BESYLATE 10 MG PO TABS
10.0000 mg | ORAL_TABLET | Freq: Every day | ORAL | Status: DC
  Administered 2019-03-17 – 2019-03-21 (×5): 10 mg via ORAL
  Filled 2019-03-16 (×5): qty 1

## 2019-03-16 MED ORDER — ADULT MULTIVITAMIN W/MINERALS CH
1.0000 | ORAL_TABLET | Freq: Every day | ORAL | Status: DC
  Administered 2019-03-16 – 2019-03-20 (×4): 1 via ORAL
  Filled 2019-03-16 (×4): qty 1

## 2019-03-16 MED ORDER — FUROSEMIDE 10 MG/ML IJ SOLN
60.0000 mg | Freq: Once | INTRAMUSCULAR | Status: AC
  Administered 2019-03-16: 07:00:00 60 mg via INTRAVENOUS

## 2019-03-16 MED ORDER — SERTRALINE HCL 50 MG PO TABS
150.0000 mg | ORAL_TABLET | Freq: Every day | ORAL | Status: DC
  Administered 2019-03-17 – 2019-03-21 (×5): 150 mg via ORAL
  Filled 2019-03-16 (×6): qty 1

## 2019-03-16 MED ORDER — FUROSEMIDE 10 MG/ML IJ SOLN
INTRAMUSCULAR | Status: AC
  Filled 2019-03-16: qty 6

## 2019-03-16 MED ORDER — PROPOFOL 1000 MG/100ML IV EMUL
0.0000 ug/kg/min | INTRAVENOUS | Status: DC
  Administered 2019-03-16 (×2): 75 ug/kg/min via INTRAVENOUS

## 2019-03-16 MED ORDER — LISINOPRIL 40 MG PO TABS
40.0000 mg | ORAL_TABLET | Freq: Every day | ORAL | Status: DC
  Administered 2019-03-16 – 2019-03-21 (×6): 40 mg via ORAL
  Filled 2019-03-16 (×6): qty 1

## 2019-03-16 MED ORDER — GABAPENTIN 300 MG PO CAPS
300.0000 mg | ORAL_CAPSULE | Freq: Three times a day (TID) | ORAL | Status: DC
  Administered 2019-03-16 – 2019-03-21 (×15): 300 mg via ORAL
  Filled 2019-03-16 (×18): qty 1

## 2019-03-16 MED ORDER — FOLIC ACID 1 MG PO TABS
1.0000 mg | ORAL_TABLET | Freq: Every day | ORAL | Status: DC
  Administered 2019-03-16 – 2019-03-21 (×5): 1 mg via ORAL
  Filled 2019-03-16 (×6): qty 1

## 2019-03-16 MED ORDER — CHLORHEXIDINE GLUCONATE 0.12% ORAL RINSE (MEDLINE KIT)
15.0000 mL | Freq: Two times a day (BID) | OROMUCOSAL | Status: DC
  Administered 2019-03-16 – 2019-03-18 (×5): 15 mL via OROMUCOSAL

## 2019-03-16 MED ORDER — METHYLPREDNISOLONE SODIUM SUCC 125 MG IJ SOLR
125.0000 mg | Freq: Once | INTRAMUSCULAR | Status: AC
  Administered 2019-03-16: 07:00:00 125 mg via INTRAVENOUS

## 2019-03-16 MED ORDER — ASPIRIN EC 81 MG PO TBEC: 81.0000 mg | DELAYED_RELEASE_TABLET | Freq: Every day | ORAL | Status: DC

## 2019-03-16 MED ORDER — FENTANYL CITRATE (PF) 100 MCG/2ML IJ SOLN: 25.0000 ug | INTRAMUSCULAR | Status: DC | PRN

## 2019-03-16 MED ORDER — ENOXAPARIN SODIUM 40 MG/0.4ML ~~LOC~~ SOLN
40.0000 mg | SUBCUTANEOUS | Status: DC
  Administered 2019-03-16 – 2019-03-19 (×4): 40 mg via SUBCUTANEOUS
  Filled 2019-03-16 (×4): qty 0.4

## 2019-03-16 MED ORDER — FUROSEMIDE 40 MG PO TABS
40.0000 mg | ORAL_TABLET | Freq: Two times a day (BID) | ORAL | Status: DC
  Administered 2019-03-16 – 2019-03-17 (×2): 40 mg via ORAL
  Filled 2019-03-16 (×2): qty 1

## 2019-03-16 MED ORDER — MELOXICAM 7.5 MG PO TABS
15.0000 mg | ORAL_TABLET | Freq: Every day | ORAL | Status: DC
  Administered 2019-03-16 – 2019-03-20 (×5): 15 mg via ORAL
  Filled 2019-03-16 (×6): qty 2

## 2019-03-16 MED ORDER — VITAMIN D 25 MCG (1000 UNIT) PO TABS
1000.0000 [IU] | ORAL_TABLET | Freq: Every day | ORAL | Status: DC
  Administered 2019-03-16 – 2019-03-20 (×5): 1000 [IU] via ORAL
  Filled 2019-03-16 (×5): qty 1

## 2019-03-16 MED ORDER — ORAL CARE MOUTH RINSE
15.0000 mL | OROMUCOSAL | Status: DC
  Administered 2019-03-16 – 2019-03-18 (×21): 15 mL via OROMUCOSAL

## 2019-03-16 MED ORDER — VITAL HIGH PROTEIN PO LIQD
1000.0000 mL | ORAL | Status: DC
  Administered 2019-03-16 – 2019-03-17 (×2): 1000 mL

## 2019-03-16 MED ORDER — MUPIROCIN 2 % EX OINT
1.0000 "application " | TOPICAL_OINTMENT | Freq: Two times a day (BID) | CUTANEOUS | Status: AC
  Administered 2019-03-16 – 2019-03-21 (×10): 1 via NASAL
  Filled 2019-03-16 (×2): qty 22

## 2019-03-16 MED ORDER — VITAMIN B-1 100 MG PO TABS
100.0000 mg | ORAL_TABLET | Freq: Every day | ORAL | Status: DC
  Administered 2019-03-16 – 2019-03-21 (×5): 100 mg via ORAL
  Filled 2019-03-16 (×6): qty 1

## 2019-03-16 MED ORDER — ETOMIDATE 2 MG/ML IV SOLN
INTRAVENOUS | Status: AC | PRN
  Administered 2019-03-16: 20 mg via INTRAVENOUS

## 2019-03-16 MED ORDER — SODIUM CHLORIDE 0.9 % IV SOLN
1.0000 g | Freq: Once | INTRAVENOUS | Status: AC
  Administered 2019-03-16: 1 g via INTRAVENOUS
  Filled 2019-03-16: qty 10

## 2019-03-16 MED ORDER — PANTOPRAZOLE SODIUM 40 MG IV SOLR
40.0000 mg | Freq: Every day | INTRAVENOUS | Status: DC
  Administered 2019-03-16 – 2019-03-17 (×2): 40 mg via INTRAVENOUS
  Filled 2019-03-16 (×2): qty 40

## 2019-03-16 MED ORDER — PROPOFOL 1000 MG/100ML IV EMUL
INTRAVENOUS | Status: AC
  Administered 2019-03-16: 12:00:00
  Filled 2019-03-16: qty 100

## 2019-03-16 MED ORDER — SUCCINYLCHOLINE CHLORIDE 20 MG/ML IJ SOLN
INTRAMUSCULAR | Status: AC | PRN
  Administered 2019-03-16: 150 mg via INTRAVENOUS

## 2019-03-16 MED ORDER — PROPOFOL 1000 MG/100ML IV EMUL
5.0000 ug/kg/min | INTRAVENOUS | Status: DC
  Administered 2019-03-16: 75 ug/kg/min via INTRAVENOUS
  Administered 2019-03-16: 70 ug/kg/min via INTRAVENOUS
  Administered 2019-03-16: 75 ug/kg/min via INTRAVENOUS
  Administered 2019-03-16: 70 ug/kg/min via INTRAVENOUS
  Administered 2019-03-16: 75 ug/kg/min via INTRAVENOUS
  Administered 2019-03-17: 65 ug/kg/min via INTRAVENOUS
  Administered 2019-03-17: 50 ug/kg/min via INTRAVENOUS
  Administered 2019-03-17: 70 ug/kg/min via INTRAVENOUS
  Filled 2019-03-16 (×9): qty 100

## 2019-03-16 MED ORDER — SODIUM CHLORIDE 0.9 % IV SOLN
500.0000 mg | Freq: Once | INTRAVENOUS | Status: AC
  Administered 2019-03-16: 500 mg via INTRAVENOUS
  Filled 2019-03-16: qty 500

## 2019-03-16 MED ORDER — POLYVINYL ALCOHOL 1.4 % OP SOLN: 1.0000 [drp] | Freq: Four times a day (QID) | OPHTHALMIC | Status: DC | PRN

## 2019-03-16 MED ORDER — FENTANYL CITRATE (PF) 100 MCG/2ML IJ SOLN
25.0000 ug | INTRAMUSCULAR | Status: DC | PRN
  Administered 2019-03-16: 100 ug via INTRAVENOUS
  Administered 2019-03-17: 50 ug via INTRAVENOUS
  Filled 2019-03-16: qty 2

## 2019-03-16 MED ORDER — SENNOSIDES 8.8 MG/5ML PO SYRP: 5.0000 mL | ORAL_SOLUTION | Freq: Two times a day (BID) | ORAL | Status: DC | PRN

## 2019-03-16 NOTE — Code Documentation (Signed)
Pt intubated by ED provider.

## 2019-03-16 NOTE — ED Notes (Addendum)
Spoke with pt's wife, she states that he hasn't been well for several days. She states that he was diagnosed with CHF and he is an alcoholic. She says that he gets up really early in the morning and starts drinking beer to the point that he can barely walk, and has fallen several times this week and that is where the bruises and cuts on his arms have come from. She states "all he has been doing is drinking and sitting on his butt"

## 2019-03-16 NOTE — Progress Notes (Signed)
Initial Nutrition Assessment  RD working remotely.   DOCUMENTATION CODES:   Morbid obesity  INTERVENTION:  - TF recommendation: Vital High Protein @ 15 ml/hr with 60 ml prostat x5/day (10 packets/day) - this regimen + kcal from current propofol rate will provide 2896 kcal, 181 grams protein, 140 grams carbohydrate, and 301 ml free water.  - free water flush, if desired, to be per MD/NP.    NUTRITION DIAGNOSIS:   Inadequate oral intake related to inability to eat as evidenced by NPO status.  GOAL:   Provide needs based on ASPEN/SCCM guidelines  MONITOR:   Vent status, TF tolerance, Labs, Weight trends  REASON FOR ASSESSMENT:   Malnutrition Screening Tool, Ventilator, Consult Assessment of nutrition requirement/status, Other (Comment)(TF recs)  ASSESSMENT:   68 y.o. male with medical history of BPH, prostate cancer, COPD, DM, HTN, and hypercholesterolemia. Patient presented to the ED via EM in severe respiratory distress. He also had BLE edema and reported coughing that began the day of presentation.  RN note from this AM indicates that patient's wife reported that patient is an alcoholic and that he begins drinking early in the morning and drinks to the point of being unable to walk and that he has fallen several times in the past week.   He was intubated in the ED and OGT placed.  Per chart review, current weight is 288 lb, weight on 7/22 was 281 lb, and weight on 01/31/18 was 271 lb.    Patient is currently intubated on ventilator support MV: 8.5 L/min Temp (24hrs), Avg:97.3 F (36.3 C), Min:97.3 F (36.3 C), Max:97.3 F (36.3 C) Propofol: 58.2 ml/hr (1536 kcal)   Labs reviewed. Medications reviewed; 1000 units vitamin D3/day, 1 mg folvite/day, 40 mg lasix per OGT BID, 125 mg solu-medrol x1 dose 10/24, daily multivitamin with minerals, 100 mg thiamine per OGT/day.  IVF; NS @ 125 ml/hr. Drip; propofol @ 75 mcg/kg/min.    NUTRITION - FOCUSED PHYSICAL  EXAM:  unable to complete at this time.   Diet Order:   Diet Order            Diet NPO time specified  Diet effective now              EDUCATION NEEDS:   No education needs have been identified at this time  Skin:  Skin Assessment: Reviewed RN Assessment  Last BM:  PTA/unknown  Height:   Ht Readings from Last 1 Encounters:  03/16/19 5\' 8"  (1.727 m)    Weight:   Wt Readings from Last 1 Encounters:  03/16/19 130.8 kg    Ideal Body Weight:  70 kg  BMI:  Body mass index is 43.85 kg/m.  Estimated Nutritional Needs:   Kcal:  1439-1831 kcal  Protein:  >/= 175 grams  Fluid:  >/= 2 L/day      Jarome Matin, MS, RD, LDN, [facility] Inpatient Clinical Dietitian Pager # [phone] After hours/weekend pager # [phone]

## 2019-03-16 NOTE — ED Provider Notes (Signed)
[facility] EMERGENCY DEPARTMENT Provider Note   CSN: [id number] Arrival date & time: 03/16/19  [id number]     History   Chief Complaint Chief Complaint  Patient presents with  . Respiratory Distress    HPI Francis Cardenas is a 68 y.o. male.     HPI Patient presents in severe respiratory distress.  EMS reports they were only able to get very limited history.  Patient does have history of COPD.  Patient is denying chest pain but reports he is severely short of breath.  He reports he was not short of breath once before about a year ago with pneumonia.  He is denying any fever.  He reports he just started coughing this morning.  He endorses swelling of the legs.  Patient is in significant respiratory distress and can only give short simple answers. Past Medical History:  Diagnosis Date  . BPH (benign prostatic hyperplasia)   . Cancer [facility])    prostate  . COPD (chronic obstructive pulmonary disease) ([place])   . Diabetes mellitus without complication ([place])   . Hypercholesteremia   . Hypertension     Patient Active Problem List   Diagnosis Date Noted  . COPD with acute exacerbation ([place]) 12/12/2018  . Obesity, Class III, BMI 40-49.9 (morbid obesity) ([place]) 12/12/2018  . Chronic diastolic CHF (congestive heart failure) ([place]) 12/10/2018  . Fall 12/10/2018  . Multiple rib fractures 12/10/2018  . OSA on CPAP 12/10/2018  . Hypokalemia 12/10/2018  . Acute on chronic systolic CHF (congestive heart failure) ([place])   . Class 2 severe obesity due to excess calories with serious comorbidity and body mass index (BMI) of 37.0 to 37.9 in adult [facility])   . CHF (congestive heart failure) ([place]) 01/28/2018  . Acute CHF (congestive heart failure) ([place]) 12/31/2017  . Acute respiratory failure with hypoxia ([place]) 12/30/2017  . Acute exacerbation of CHF (congestive heart failure) ([place]) 12/30/2017  . Chest pain syndrome 12/30/2017  . Carcinoma of prostate ([place]) 08/02/2016  . COPD (chronic  obstructive pulmonary disease) ([place]) 08/02/2016  . GERD (gastroesophageal reflux disease) 08/02/2016  . Hyperlipidemia 08/02/2016  . Peripheral neuropathy 08/02/2016  . Steatosis of liver 08/02/2016  . Type 2 diabetes mellitus with other specified complication ([place]) [id number]  . Benign essential hypertension 06/11/2013  . Alcohol dependence ([place]) 03/26/2013  . PTSD (post-traumatic stress disorder) 03/26/2013  . Alcohol abuse 03/24/2013  . Depressive disorder 03/24/2013    Past Surgical History:  Procedure Laterality Date  . ACHILLES TENDON REPAIR Left   . KNEE ARTHROSCOPY Left   . TONSILLECTOMY    . WRIST FRACTURE SURGERY Left         Home Medications    Prior to Admission medications   Medication Sig Start Date End Date Taking? Authorizing Provider  albuterol (PROVENTIL HFA;VENTOLIN HFA) 108 (90 BASE) MCG/ACT inhaler Inhale 2 puffs into the lungs every 6 (six) hours as needed for wheezing.    [provider]  ALPRAZolam Duanne Moron) 1 MG tablet Take 1 mg by mouth at bedtime. 12/05/18   [provider]  amLODipine (NORVASC) 10 MG tablet Take 10 mg by mouth daily with breakfast.     [provider]  aspirin EC 81 MG tablet Take 81 mg by mouth daily with breakfast.     [provider]  atorvastatin (LIPITOR) 80 MG tablet Take 40 mg by mouth daily with supper.     [provider]  budesonide-formoterol (SYMBICORT) 80-4.5 MCG/ACT inhaler Inhale 2 puffs into the  lungs 2 (two) times daily.    [provider]  Carboxymethylcellulose Sodium 0.25 % SOLN Place 1 drop into both eyes 4 (four) times daily as needed (dry eyes/ irritation).    [provider]  carvedilol (COREG) 25 MG tablet Take 1 tablet (25 mg total) by mouth 2 (two) times daily with a meal. For hypertension. 03/31/13   Ruben Im, PA-C  cholecalciferol (VITAMIN D) 1000 units tablet Take 1,000 Units by mouth daily with supper.     [provider]   diclofenac sodium (VOLTAREN) 1 % GEL Apply 1 application topically 2 (two) times daily as needed (pain).  10/12/18   [provider]  Ensure Max Protein (ENSURE MAX PROTEIN) LIQD Take 330 mLs (11 oz total) by mouth 2 (two) times daily. 12/12/18   [name], [name], MD  furosemide (LASIX) 40 MG tablet Take 1 tablet (40 mg total) by mouth 2 (two) times daily. Patient taking differently: Take 40 mg by mouth 2 (two) times daily with a meal.  01/31/18   Barton Dubois, MD  gabapentin (NEURONTIN) 300 MG capsule Take 300 mg by mouth 3 (three) times daily.    [provider]  lisinopril (PRINIVIL,ZESTRIL) 40 MG tablet Take 1 tablet (40 mg total) by mouth daily. For hypertension. Patient taking differently: Take 40 mg by mouth daily with breakfast. For hypertension. 03/31/13   Nena Polio T, PA-C  magnesium oxide (MAG-OX) 400 MG tablet Take 1 tablet (400 mg total) by mouth daily. Patient taking differently: Take 400 mg by mouth daily with supper.  01/31/18   Barton Dubois, MD  meloxicam (MOBIC) 15 MG tablet Take 15 mg by mouth at bedtime. 10/31/18   [provider]  metFORMIN (GLUCOPHAGE) 500 MG tablet Take 500 mg by mouth daily with breakfast.    [provider]  Multiple Vitamins-Minerals (MULTIVITAMIN WITH MINERALS) tablet Take 1 tablet by mouth daily with lunch.     [provider]  omeprazole (PRILOSEC) 40 MG capsule Take 40 mg by mouth daily with supper.     [provider]  potassium chloride SA (K-DUR,KLOR-CON) 20 MEQ tablet Take 2 tablets (40 mEq total) by mouth daily. Patient taking differently: Take 40 mEq by mouth daily with supper.  01/31/18   Barton Dubois, MD  PRESCRIPTION MEDICATION Inhale into the lungs at bedtime. CPAP    [provider]  sertraline (ZOLOFT) 100 MG tablet Take one pill by mouth each day for depression and anxiety. Patient taking differently: Take 150 mg by mouth daily with breakfast.  03/31/13   Nena Polio T,  PA-C  tiotropium (SPIRIVA HANDIHALER) 18 MCG inhalation capsule Place 1 capsule (18 mcg total) into inhaler and inhale daily. 12/12/18 12/12/19  [name], [name], MD  vitamin B-12 (CYANOCOBALAMIN) 1000 MCG tablet Take 1,000 mcg by mouth daily with supper.     [provider]    Family History Family History  Problem Relation Age of Onset  . CAD Mother   . Leukemia Mother   . Heart attack Father   . Heart attack Brother     Social History Social History   Tobacco Use  . Smoking status: Former Research scientist (life sciences)  . Smokeless tobacco: Never Used  Substance Use Topics  . Alcohol use: Yes    Alcohol/week: 18.0 standard drinks    Types: 18 Cans of beer per week    Comment: 18 cans beer/day  . Drug use: No     Allergies   Patient has no known allergies.  Review of Systems Review of Systems Level 5 caveat cannot obtain full review of systems due to patient respiratory distress.  Physical Exam Updated Vital Signs BP (!) 156/103   Pulse (!) 110   Temp (!) 97.3 F (36.3 C) (Temporal)   Resp (!) 22   Wt 129.3 kg   SpO2 94%   BMI 40.89 kg/m   Physical Exam Constitutional:      Comments: Patient is in severe respiratory distress.  He is awake.  He is able answer and very short sentences.  Morbid obesity.  HENT:     Head: Normocephalic and atraumatic.     Mouth/Throat:     Comments: Airway is patent.  Tongue is dry. Eyes:     Extraocular Movements: Extraocular movements intact.  Cardiovascular:     Comments: Tachycardic regular.  Significant background respiratory noise cannot appreciate rub murmur gallop. Pulmonary:     Comments: Significant increased work of breathing with abdominal breathing.  Breath sounds are very soft at the bases crackles present. Abdominal:     Comments: Abdomen is obese but soft and nondistended.  Musculoskeletal:     Comments: 2+ pitting edema bilateral lower extremities to the knees.  Calves are soft and pliable.  Neurological:     Comments:  Patient is awake and interacting.  He does seem situationally aware but seems slightly drowsy.  He is moving all 4 extremities.  No focal motor deficits.      ED Treatments / Results  Labs (all labs ordered are listed, but only abnormal results are displayed) Labs Reviewed  SARS CORONAVIRUS 2 BY RT PCR ([facility])  CBC WITH DIFFERENTIAL/PLATELET  COMPREHENSIVE METABOLIC PANEL  BRAIN NATRIURETIC PEPTIDE  LACTIC ACID, PLASMA  URINALYSIS, ROUTINE W REFLEX MICROSCOPIC  I-STAT ARTERIAL BLOOD GAS, ED  TROPONIN I (HIGH SENSITIVITY)    EKG EKG Interpretation  Date/Time:  Saturday March 16 2019 06:50:48 EDT Ventricular Rate:  111 PR Interval:    QRS Duration: 110 QT Interval:  340 QTC Calculation: 462 R Axis:   85 Text Interpretation:  Sinus or ectopic atrial tachycardia Paired ventricular premature complexes Borderline right axis deviation Low voltage, precordial leads No significant change since last tracing Confirmed by Orpah Greek [phone]) on 03/16/2019 6:57:52 AM   Radiology No results found.  Procedures Procedure Name: Intubation Date/Time: 03/16/2019 7:30 AM Performed by: Charlesetta Shanks, MD Pre-anesthesia Checklist: Patient identified, Patient being monitored, Emergency Drugs available, Timeout performed and Suction available Oxygen Delivery Method: Non-rebreather mask Preoxygenation: Pre-oxygenation with 100% oxygen Induction Type: Rapid sequence Ventilation: Mask ventilation without difficulty Laryngoscope Size: Glidescope and 4 Grade View: Grade III Tube size: 8.0 mm Number of attempts: 1 Placement Confirmation: ETT inserted through vocal cords under direct vision,  CO2 detector and Breath sounds checked- equal and bilateral Secured at: 25 cm Tube secured with: ETT holder Dental Injury: Teeth and Oropharynx as per pre-operative assessment  Difficulty Due To: Difficult Airway- due to large tongue Comments:  Patient intubated on first attempt without difficulty however of note, patient does not have large tongue and floppy, patulous epiglottis which could make for difficult intubation without video assisting equipment.      (including critical care time) CRITICAL CARE Performed by: Charlesetta Shanks   Total critical care time: 45 minutes  Critical care time was exclusive of separately billable procedures and treating other patients.  Critical care was necessary to treat or prevent imminent or life-threatening deterioration.  Critical care was time spent  personally by me on the following activities: development of treatment plan with patient and/or surrogate as well as nursing, discussions with consultants, evaluation of patient's response to treatment, examination of patient, obtaining history from patient or surrogate, ordering and performing treatments and interventions, ordering and review of laboratory studies, ordering and review of radiographic studies, pulse oximetry and re-evaluation of patient's condition. Medications Ordered in ED Medications  furosemide (LASIX) 10 MG/ML injection (  Canceled Entry 03/16/19 0718)  methylPREDNISolone sodium succinate (SOLU-MEDROL) 125 mg/2 mL injection (  Canceled Entry 03/16/19 0718)  propofol (DIPRIVAN) 1000 MG/100ML infusion (has no administration in time range)  etomidate (AMIDATE) injection (20 mg Intravenous Given 03/16/19 0715)  succinylcholine (ANECTINE) injection (150 mg Intravenous Given 03/16/19 0715)  propofol (DIPRIVAN) 1000 MG/100ML infusion (15 mcg Intravenous New Bag/Given 03/16/19 0717)  aspirin suppository 300 mg (has no administration in time range)  methylPREDNISolone sodium succinate (SOLU-MEDROL) 125 mg/2 mL injection 125 mg (125 mg Intravenous Given 03/16/19 0707)  furosemide (LASIX) injection 60 mg (60 mg Intravenous Given 03/16/19 0708)     Initial Impression / Assessment and Plan / ED Course  I have reviewed the triage  vital signs and the nursing notes.  Pertinent labs & imaging results that were available during my care of the patient were reviewed by me and considered in my medical decision making (see chart for details).  Clinical Course as of Mar 15 904  Sat Mar 16, 2019  0749 Patient's vital signs stable.  He is currently on propofol drip.  Blood pressures 140s over 70s.  Oxygen 100%.  Heart rate regular, narrow complex approximately 100 bpm.    [MP]  0831 Consult:dr. Olalere intensivist for admission   [MP]  0902 Covid testing is negative.  Post intubation patient's chest x-ray is showing significantly more right-sided pneumonia appearance.  Treatment for community-acquired pneumonia has been initiated with Rocephin and Zithromax.  At this time will implement sepsis order set.  Patient's blood pressures however stable and clinically patient appears volume overloaded with soft pitting edema symmetrically of the lower legs and some edematous appearance as well on the chest x-ray.  I had already had Lasix 60 mg administered based on history and clinical findings as well as first chest x-ray that were very suggestive of CHF.  At this time, will not implement sepsis fluid bolusing.  Patient will be seen by intensivist and fluid administration can be done on as needed based on patient's clinical status and vital signs.   [MP]    Clinical Course User Index [MP] Charlesetta Shanks, MD      Francis Cardenas was evaluated in Emergency Department on 03/16/2019 for the symptoms described in the history of present illness. He was evaluated in the context of the global COVID-19 pandemic, which necessitated consideration that the patient might be at risk for infection with the SARS-CoV-2 virus that causes COVID-19. Institutional protocols and algorithms that pertain to the evaluation of patients at risk for COVID-19 are in a state of rapid change based on information released by regulatory bodies including the CDC and federal  and state organizations. These policies and algorithms were followed during the patient's care in the ED.  Covid testing is negative.  Post intubation patient has significantly more dense infiltrates on the left.  Findings are very suggestive of pneumonia.  Patient comes from home living with his wife.  Community-acquired pneumonia antibiotic therapy initiated.  Clinically, patient also appears volume overloaded.  He has peripheral edema and vascular congestion.  BNP however is not significantly elevated.  See progress notes for additional information of course of management.  Final Clinical Impressions(s) / ED Diagnoses   Final diagnoses:  Community acquired pneumonia, unspecified laterality  Acute hypoxemic respiratory failure [facility])    ED Discharge Orders    None       Charlesetta Shanks, MD 03/16/19 [phone]

## 2019-03-16 NOTE — ED Notes (Signed)
ICU MD at bedside

## 2019-03-16 NOTE — ED Notes (Signed)
5 nitro given by EMS pta with no relief.

## 2019-03-16 NOTE — ED Triage Notes (Signed)
Pt brought to ED by [facility] from home for respiratory distress, SPO2 51% on RA,  Placed on CPAP with SPO2 75%, rales sound all over, Hx of CHF. BP 210 palpable, HR 117, cbg 250.

## 2019-03-16 NOTE — Consult Note (Signed)
NAME:  Francis Cardenas, MRN:  [id number], DOB:  05/22/1951, LOS: 0 ADMISSION DATE:  03/16/2019, CONSULTATION DATE: 03/16/2019 REFERRING MD: Dr. Vallery Ridge, CHIEF COMPLAINT: Respiratory failure  Brief History   68 year old gentleman with a history of COPD EMS activated full worsening shortness of breath Cough of just a days duration, no fevers  History of present illness   History of COPD Was evaluated by EMS this morning following activation for shortness of breath Denies fevers Treated for pneumonia about a year ago He was well up until the day before Limited history was obtained from the patient prior to presentation Transferred on oxygen, placed on CPAP Following continuing decompensation was intubated  Past Medical History   Past Medical History:  Diagnosis Date  . BPH (benign prostatic hyperplasia)   . Cancer [facility])    prostate  . COPD (chronic obstructive pulmonary disease) ([place])   . Diabetes mellitus without complication ([place])   . Hypercholesteremia   . Hypertension    History of chronic systolic and diastolic heart failure History of multiple rib fractures History of obstructive sleep apnea for which he uses CPAP Does have a history of alcohol abuse History of depressive disorder History of PTSD History of peripheral neuropathy Chronic pain syndrome Carcinoma of the prostate-diagnosed 2018  Significant Hospital Events   Intubated in the emergency department for hypoxemic respiratory failure  Consults:  PCCM consulted 03/16/2019  Procedures:  Endotracheal intubation  Significant Diagnostic Tests:  Chest x-ray showing right lung infiltrate Recent CT scan of the chest on 12/10/2018-reviewed by myself not showing any infiltrative process  Micro Data:  Blood cultures 10/24>>  Antimicrobials:  Azithromycin 10/24>> Ceftriaxone 10/24>>  Interim history/subjective:  Sedated, tolerating ventilation well Does open eyes and look around  Objective   Blood  pressure (!) 153/86, pulse 88, temperature (!) 97.3 F (36.3 C), temperature source Temporal, resp. rate (!) 21, height 5\' 8"  (1.727 m), weight 129.3 kg, SpO2 100 %.    Vent Mode: PRVC FiO2 (%):  [100 %] 100 % Set Rate:  [18 bmp] 18 bmp Vt Set:  [590 mL] 590 mL PEEP:  [5 cmH20] 5 cmH20 Plateau Pressure:  [25 cmH20] 25 cmH20   Intake/Output Summary (Last 24 hours) at 03/16/2019 [id number] Last data filed at 03/16/2019 0831 Gross per 24 hour  Intake 100 ml  Output -  Net 100 ml   Filed Weights   03/16/19 0711  Weight: 129.3 kg    Examination: General: Elderly, does not appear to be in extremities, appears comfortable on the vent HENT: Moist oral mucosa, endotracheal tube in place Lungs: Rhonchi bilaterally Cardiovascular: S1-S2 appreciated Abdomen: Soft, nontender, bowel sounds appreciated Extremities: Bilateral edema Neuro: Opens eyes, not following commands GU:   Resolved Hospital Problem list     Assessment & Plan:  Hypoxemic respiratory failure -Related to COPD exacerbation -Possible pneumonia  Possible sepsis -Altered mentation -Infiltrative process in the lungs  Systolic and diastolic heart failure -History of congestive heart failure -Monitor closely  Alcoholism -Monitor for withdrawal  -Thiamine, folate  Diabetes -Monitor sugars  History of obstructive sleep apnea Morbid obesity    Continue azithromycin and Rocephin for community-acquired pneumonia Monitor for alcohol withdrawal Continue ventilator support Daily weaning trials Bronchodilators  Risk of decompensation remains high Best practice:  Diet: We will start on tube feeding Pain/Anxiety/Delirium protocol (if indicated): Propofol/fentanyl VAP protocol (if indicated): In place DVT prophylaxis: Lovenox GI prophylaxis: Protonix Glucose control: SSI Mobility: Bedrest Code Status: Full code Family Communication: Will update Disposition: ICU  Labs   CBC: Recent Labs  Lab 03/16/19 0705  03/16/19 0853  WBC 11.3*  --   NEUTROABS 9.6*  --   HGB 14.0 13.3  HCT 42.3 39.0  MCV 97.2  --   PLT 159  --     Basic Metabolic Panel: Recent Labs  Lab 03/16/19 0705 03/16/19 0853  NA 140 140  K 3.6 3.6  CL 104  --   CO2 24  --   GLUCOSE 232*  --   BUN 9  --   CREATININE 0.81  --   CALCIUM 10.0  --    GFR: Estimated Creatinine Clearance: 111.4 mL/min (by C-G formula based on SCr of 0.81 mg/dL). Recent Labs  Lab 03/16/19 0705  WBC 11.3*  LATICACIDVEN 1.7    Liver Function Tests: Recent Labs  Lab 03/16/19 0705  AST 38  ALT 37  ALKPHOS 116  BILITOT 0.5  PROT 7.1  ALBUMIN 4.2   No results for input(s): LIPASE, AMYLASE in the last 168 hours. No results for input(s): AMMONIA in the last 168 hours.  ABG    Component Value Date/Time   PHART 7.337 (L) 03/16/2019 0853   PCO2ART 55.7 (H) 03/16/2019 0853   PO2ART 272.0 (H) 03/16/2019 0853   HCO3 29.8 (H) 03/16/2019 0853   TCO2 31 03/16/2019 0853   ACIDBASEDEF 1.0 12/10/2018 1813   O2SAT 100.0 03/16/2019 0853     Coagulation Profile: No results for input(s): INR, PROTIME in the last 168 hours.  Cardiac Enzymes: No results for input(s): CKTOTAL, CKMB, CKMBINDEX, TROPONINI in the last 168 hours.  HbA1C: Hgb A1c MFr Bld  Date/Time Value Ref Range Status  12/30/2017 08:18 AM 5.4 4.8 - 5.6 % Final    Comment:    (NOTE) Pre diabetes:          5.7%-6.4% Diabetes:              >6.4% Glycemic control for   <7.0% adults with diabetes     CBG: No results for input(s): GLUCAP in the last 168 hours.  Review of Systems:   History obtained from spouse reveals times been drinking a lot, recent falls Been feeling poorly for a few days He is unable to provide any history of present  Past Medical History  He,  has a past medical history of BPH (benign prostatic hyperplasia), Cancer ([place]), COPD (chronic obstructive pulmonary disease) ([place]), Diabetes mellitus without complication ([place]), Hypercholesteremia, and  Hypertension.   Surgical History    Past Surgical History:  Procedure Laterality Date  . ACHILLES TENDON REPAIR Left   . KNEE ARTHROSCOPY Left   . TONSILLECTOMY    . WRIST FRACTURE SURGERY Left      Social History   reports that he has quit smoking. He has never used smokeless tobacco. He reports current alcohol use of about 18.0 standard drinks of alcohol per week. He reports that he does not use drugs.   Family History   His family history includes CAD in his mother; Heart attack in his brother and father; Leukemia in his mother.   Allergies No Known Allergies     The patient is critically ill with multiple organ systems failure and requires high complexity decision making for assessment and support, frequent evaluation and titration of therapies, application of advanced monitoring technologies and extensive interpretation of multiple databases. Critical Care Time devoted to patient care services described in this note independent of APP/resident time (if applicable)  is 40 minutes.   Verna Desrocher Ander Slade  MD [place] [facility] Personal pager: [phone] If unanswered, please page CCM On-call: [phone]

## 2019-03-17 ENCOUNTER — Inpatient Hospital Stay (HOSPITAL_COMMUNITY): Payer: No Typology Code available for payment source

## 2019-03-17 DIAGNOSIS — J189 Pneumonia, unspecified organism: Secondary | ICD-10-CM | POA: Diagnosis not present

## 2019-03-17 DIAGNOSIS — M7989 Other specified soft tissue disorders: Secondary | ICD-10-CM

## 2019-03-17 DIAGNOSIS — I340 Nonrheumatic mitral (valve) insufficiency: Secondary | ICD-10-CM | POA: Diagnosis not present

## 2019-03-17 DIAGNOSIS — J9601 Acute respiratory failure with hypoxia: Secondary | ICD-10-CM | POA: Diagnosis not present

## 2019-03-17 LAB — PHOSPHORUS: Phosphorus: 3 mg/dL (ref 2.5–4.6)

## 2019-03-17 LAB — POCT I-STAT 7, (LYTES, BLD GAS, ICA,H+H)
Acid-Base Excess: 2 mmol/L (ref 0.0–2.0)
Bicarbonate: 26.6 mmol/L (ref 20.0–28.0)
Calcium, Ion: 1.3 mmol/L (ref 1.15–1.40)
HCT: 32 % — ABNORMAL LOW (ref 39.0–52.0)
Hemoglobin: 10.9 g/dL — ABNORMAL LOW (ref 13.0–17.0)
O2 Saturation: 95 %
Patient temperature: 98.4
Potassium: 3.1 mmol/L — ABNORMAL LOW (ref 3.5–5.1)
Sodium: 141 mmol/L (ref 135–145)
TCO2: 28 mmol/L (ref 22–32)
pCO2 arterial: 40.4 mmHg (ref 32.0–48.0)
pH, Arterial: 7.426 (ref 7.350–7.450)
pO2, Arterial: 71 mmHg — ABNORMAL LOW (ref 83.0–108.0)

## 2019-03-17 LAB — CBC
HCT: 36.7 % — ABNORMAL LOW (ref 39.0–52.0)
Hemoglobin: 12.3 g/dL — ABNORMAL LOW (ref 13.0–17.0)
MCH: 32.3 pg (ref 26.0–34.0)
MCHC: 33.5 g/dL (ref 30.0–36.0)
MCV: 96.3 fL (ref 80.0–100.0)
Platelets: 135 10*3/uL — ABNORMAL LOW (ref 150–400)
RBC: 3.81 MIL/uL — ABNORMAL LOW (ref 4.22–5.81)
RDW: 14.6 % (ref 11.5–15.5)
WBC: 7.4 10*3/uL (ref 4.0–10.5)
nRBC: 0 % (ref 0.0–0.2)

## 2019-03-17 LAB — BASIC METABOLIC PANEL
Anion gap: 12 (ref 5–15)
BUN: 10 mg/dL (ref 8–23)
CO2: 24 mmol/L (ref 22–32)
Calcium: 9.6 mg/dL (ref 8.9–10.3)
Chloride: 105 mmol/L (ref 98–111)
Creatinine, Ser: 0.75 mg/dL (ref 0.61–1.24)
GFR calc Af Amer: >60 mL/min (ref >60)
GFR calc non Af Amer: >60 mL/min (ref >60)
Glucose, Bld: 133 mg/dL — ABNORMAL HIGH (ref 70–99)
Potassium: 3.2 mmol/L — ABNORMAL LOW (ref 3.5–5.1)
Sodium: 141 mmol/L (ref 135–145)

## 2019-03-17 LAB — GLUCOSE, CAPILLARY
Glucose-Capillary: 108 mg/dL — ABNORMAL HIGH (ref 70–99)
Glucose-Capillary: 112 mg/dL — ABNORMAL HIGH (ref 70–99)
Glucose-Capillary: 116 mg/dL — ABNORMAL HIGH (ref 70–99)
Glucose-Capillary: 119 mg/dL — ABNORMAL HIGH (ref 70–99)
Glucose-Capillary: 133 mg/dL — ABNORMAL HIGH (ref 70–99)
Glucose-Capillary: 137 mg/dL — ABNORMAL HIGH (ref 70–99)
Glucose-Capillary: 152 mg/dL — ABNORMAL HIGH (ref 70–99)

## 2019-03-17 LAB — URINE CULTURE: Culture: 10000 — AB

## 2019-03-17 LAB — TRIGLYCERIDES: Triglycerides: 169 mg/dL — ABNORMAL HIGH (ref <150)

## 2019-03-17 LAB — MAGNESIUM: Magnesium: 2 mg/dL (ref 1.7–2.4)

## 2019-03-17 MED ORDER — MIDAZOLAM HCL 2 MG/2ML IJ SOLN
2.0000 mg | INTRAMUSCULAR | Status: DC | PRN
  Administered 2019-03-17 (×3): 2 mg via INTRAVENOUS
  Filled 2019-03-17 (×4): qty 2

## 2019-03-17 MED ORDER — INSULIN ASPART 100 UNIT/ML ~~LOC~~ SOLN
3.0000 [IU] | SUBCUTANEOUS | Status: DC
  Administered 2019-03-18 – 2019-03-19 (×2): 3 [IU] via SUBCUTANEOUS
  Administered 2019-03-20 (×2): 6 [IU] via SUBCUTANEOUS
  Administered 2019-03-20 (×3): 3 [IU] via SUBCUTANEOUS
  Administered 2019-03-20: 6 [IU] via SUBCUTANEOUS
  Administered 2019-03-20 – 2019-03-21 (×2): 3 [IU] via SUBCUTANEOUS

## 2019-03-17 MED ORDER — FENTANYL 2500MCG IN NS 250ML (10MCG/ML) PREMIX INFUSION
0.0000 ug/h | INTRAVENOUS | Status: DC
  Administered 2019-03-17: 25 ug/h via INTRAVENOUS
  Administered 2019-03-17: 200 ug/h via INTRAVENOUS
  Filled 2019-03-17 (×3): qty 250

## 2019-03-17 MED ORDER — POTASSIUM CHLORIDE 20 MEQ/15ML (10%) PO SOLN
30.0000 meq | ORAL | Status: AC
  Administered 2019-03-17 (×2): 30 meq
  Filled 2019-03-17 (×2): qty 30

## 2019-03-17 MED ORDER — INSULIN ASPART 100 UNIT/ML ~~LOC~~ SOLN
3.0000 [IU] | SUBCUTANEOUS | Status: DC
  Administered 2019-03-17: 3 [IU] via SUBCUTANEOUS

## 2019-03-17 MED ORDER — FUROSEMIDE 40 MG PO TABS
60.0000 mg | ORAL_TABLET | Freq: Three times a day (TID) | ORAL | Status: DC
  Administered 2019-03-17 – 2019-03-20 (×8): 60 mg via ORAL
  Filled 2019-03-17 (×9): qty 2

## 2019-03-17 NOTE — Progress Notes (Signed)
[facility] ADULT ICU REPLACEMENT PROTOCOL FOR AM LAB REPLACEMENT ONLY  The patient does apply for the [facility] Adult ICU Electrolyte Replacment Protocol based on the criteria listed below:   1. Is GFR >/= 40 ml/min? Yes.    Patient's GFR today is >60 2. Is urine output >/= 0.5 ml/kg/hr for the last 6 hours? Yes.   Patient's UOP is 0.52 ml/kg/hr 3. Is BUN < 60 mg/dL? Yes.    Patient's BUN today is 10 4. Abnormal electrolyte(s):k 3.2 5. Ordered repletion with: protocol 6. If a panic level lab has been reported, has the CCM MD in charge been notified? No..   Physician:    Ronda Fairly A 03/17/2019 5:40 AM

## 2019-03-17 NOTE — Progress Notes (Signed)
NAME:  Francis Cardenas, MRN:  [id number], DOB:  12-13-1950, LOS: 1 ADMISSION DATE:  03/16/2019, CONSULTATION DATE: 03/16/2019 REFERRING MD: Dr. Vallery Ridge, CHIEF COMPLAINT: Respiratory failure  Brief History   68 year old gentleman with a history of COPD EMS activated full worsening shortness of breath Cough of just a days duration, no fevers  History of present illness   History of COPD Was evaluated by EMS this morning following activation for shortness of breath Denies fevers Treated for pneumonia about a year ago He was well up until the day before Limited history was obtained from the patient prior to presentation Transferred on oxygen, placed on CPAP Following continuing decompensation was intubated  Past Medical History   Past Medical History:  Diagnosis Date  . BPH (benign prostatic hyperplasia)   . Cancer [facility])    prostate  . COPD (chronic obstructive pulmonary disease) ([place])   . Diabetes mellitus without complication ([place])   . Hypercholesteremia   . Hypertension    History of chronic systolic and diastolic heart failure History of multiple rib fractures History of obstructive sleep apnea for which he uses CPAP Does have a history of alcohol abuse History of depressive disorder History of PTSD History of peripheral neuropathy Chronic pain syndrome Carcinoma of the prostate-diagnosed 2018  [place] [facility] Events   10/24: Intubated in the emergency department for hypoxemic respiratory failure  Consults:  PCCM consulted 03/16/2019  Procedures:  Endotracheal intubation 10/24->  Significant Diagnostic Tests:    Micro Data:  Blood cultures 10/24>>ngtd 10/24 sars2: neg 10/24 urine: pending Antimicrobials:  Azithromycin 10/24>> Ceftriaxone 10/24>>  Interim history/subjective:  10/25: worsening infiltrate on cxr this am. No acute events otherwise reported.   Objective   Blood pressure (P) 122/60, pulse (!) 51, temperature 98.4 F (36.9 C),  temperature source Oral, resp. rate 16, height 5\' 8"  (1.727 m), weight (!) 140.3 kg, SpO2 96 %.    Vent Mode: PRVC FiO2 (%):  [40 %-60 %] 40 % Set Rate:  [16 bmp] 16 bmp Vt Set:  [550 mL] 550 mL PEEP:  [5 cmH20] 5 cmH20 Plateau Pressure:  [15 cmH20-17 cmH20] 17 cmH20   Intake/Output Summary (Last 24 hours) at 03/17/2019 0752 Last data filed at 03/17/2019 0600 Gross per 24 hour  Intake 4825.34 ml  Output 4085 ml  Net 740.34 ml   Filed Weights   03/16/19 0711 03/16/19 1300 03/17/19 0336  Weight: 129.3 kg 130.8 kg (!) 140.3 kg    Examination: General: Elderly, appears uncomfortable in bed but in sync with vent HENT: NCAT, sclera injected, Moist oral mucosa, endotracheal tube in place Lungs: Rhonchi bilaterally Cardiovascular: S1-S2 appreciated Abdomen: obese, Soft, nontender, bowel sounds appreciated Extremities: Bilateral edema upper and lower RUE with >> swelling than others.  Neuro: awake following commands, nods and shakes head seemingly appropriately to yes/no questions.    Resolved Hospital Problem list     Assessment & Plan:  Acute Hypoxemic respiratory failure, POA -Related to COPD exacerbation -Possible pneumonia -cont abx -with secretions at home prior to presentation  -titrate vent.  -will change sedation by adding fentanyl gtt to decrease propofol. Minimal opioids utilized while on continuous propofol. Will switch this to be more in line with guidelines.  Suspected CAP, gp or atypical, POA:  -empiric abx -bronchodilators prn.   Possible sepsis 2/2 above -hemodynamically stable however  HFpEF, acute decompensation -last echo 2019 -Monitor closely -continuing home meds -BNP 403 -repeat echo pending -increase home lasix dose with b/l edema HTN:  -cont home meds  Alcoholism -Monitor for withdrawal  -Thiamine, folate  RUE swelling:  -venous doppler  Diabetes 2 with hyperglycemia (per report) -ssi -check a1c  History of obstructive sleep apnea  -unclear if utilizes NIV at home.   Hypokalemia:  -Replace  Chronic normocytic anemia:  -No overt blood loss -no acute indication for transfusion -transfuse <7  Morbid obesity  Best practice:  Diet:  tube feeding Pain/Anxiety/Delirium protocol (if indicated): Propofol/fentanyl VAP protocol (if indicated): In place DVT prophylaxis: Lovenox GI prophylaxis: Protonix Glucose control: SSI Mobility: Bedrest Code Status: Full code Family Communication: 10/25 via phone Disposition: ICU  Labs   CBC: Recent Labs  Lab 03/16/19 0705 03/16/19 0853 03/16/19 1134 03/17/19 0311 03/17/19 0509  WBC 11.3*  --  8.5 7.4  --   NEUTROABS 9.6*  --   --   --   --   HGB 14.0 13.3 13.5 12.3* 10.9*  HCT 42.3 39.0 40.0 36.7* 32.0*  MCV 97.2  --  95.5 96.3  --   PLT 159  --  131* 135*  --     Basic Metabolic Panel: Recent Labs  Lab 03/16/19 0705 03/16/19 0853 03/16/19 1134 03/17/19 0311 03/17/19 0509  NA 140 140  --  141 141  K 3.6 3.6  --  3.2* 3.1*  CL 104  --   --  105  --   CO2 24  --   --  24  --   GLUCOSE 232*  --   --  133*  --   BUN 9  --   --  10  --   CREATININE 0.81  --  0.71 0.75  --   CALCIUM 10.0  --   --  9.6  --   MG  --   --   --  2.0  --   PHOS  --   --   --  3.0  --    GFR: Estimated Creatinine Clearance: 118.1 mL/min (by C-G formula based on SCr of 0.75 mg/dL). Recent Labs  Lab 03/16/19 0705 03/16/19 1134 03/17/19 0311  WBC 11.3* 8.5 7.4  LATICACIDVEN 1.7  --   --     Liver Function Tests: Recent Labs  Lab 03/16/19 0705  AST 38  ALT 37  ALKPHOS 116  BILITOT 0.5  PROT 7.1  ALBUMIN 4.2   No results for input(s): LIPASE, AMYLASE in the last 168 hours. No results for input(s): AMMONIA in the last 168 hours.  ABG    Component Value Date/Time   PHART 7.426 03/17/2019 0509   PCO2ART 40.4 03/17/2019 0509   PO2ART 71.0 (L) 03/17/2019 0509   HCO3 26.6 03/17/2019 0509   TCO2 28 03/17/2019 0509   ACIDBASEDEF 1.0 12/10/2018 1813   O2SAT 95.0  03/17/2019 0509     Coagulation Profile: Recent Labs  Lab 03/16/19 0924  INR 1.0    Cardiac Enzymes: No results for input(s): CKTOTAL, CKMB, CKMBINDEX, TROPONINI in the last 168 hours.  HbA1C: Hgb A1c MFr Bld  Date/Time Value Ref Range Status  12/30/2017 08:18 AM 5.4 4.8 - 5.6 % Final    Comment:    (NOTE) Pre diabetes:          5.7%-6.4% Diabetes:              >6.4% Glycemic control for   <7.0% adults with diabetes     CBG: Recent Labs  Lab 03/16/19 1510 03/16/19 1931 03/16/19 2358 03/17/19 0347  GLUCAP 153* 147* 152* 133*      Critical  care time: The patient is critically ill with multiple organ systems failure and requires high complexity decision making for assessment and support, frequent evaluation and titration of therapies, application of advanced monitoring technologies and extensive interpretation of multiple databases.  Critical care time 41 mins. This represents my time independent of the NPs time taking care of the pt. This is excluding procedures.    Audria Nine DO After hours pager: [phone]  [place] [facility] 03/17/2019, 7:52 AM

## 2019-03-17 NOTE — Progress Notes (Signed)
RUE venous duplex       has been completed. Preliminary results can be found under CV proc through chart review. Madolyn Ackroyd, BS, RDMS, RVT   

## 2019-03-18 ENCOUNTER — Inpatient Hospital Stay (HOSPITAL_COMMUNITY): Payer: No Typology Code available for payment source

## 2019-03-18 DIAGNOSIS — J189 Pneumonia, unspecified organism: Secondary | ICD-10-CM | POA: Diagnosis not present

## 2019-03-18 DIAGNOSIS — J9601 Acute respiratory failure with hypoxia: Secondary | ICD-10-CM | POA: Diagnosis not present

## 2019-03-18 LAB — CBC
HCT: 35.4 % — ABNORMAL LOW (ref 39.0–52.0)
Hemoglobin: 11.6 g/dL — ABNORMAL LOW (ref 13.0–17.0)
MCH: 32.2 pg (ref 26.0–34.0)
MCHC: 32.8 g/dL (ref 30.0–36.0)
MCV: 98.3 fL (ref 80.0–100.0)
Platelets: 124 10*3/uL — ABNORMAL LOW (ref 150–400)
RBC: 3.6 MIL/uL — ABNORMAL LOW (ref 4.22–5.81)
RDW: 14.7 % (ref 11.5–15.5)
WBC: 5.3 10*3/uL (ref 4.0–10.5)
nRBC: 0 % (ref 0.0–0.2)

## 2019-03-18 LAB — COMPREHENSIVE METABOLIC PANEL
ALT: 22 U/L (ref 0–44)
AST: 17 U/L (ref 15–41)
Albumin: 3.5 g/dL (ref 3.5–5.0)
Alkaline Phosphatase: 58 U/L (ref 38–126)
Anion gap: 10 (ref 5–15)
BUN: 13 mg/dL (ref 8–23)
CO2: 26 mmol/L (ref 22–32)
Calcium: 9.3 mg/dL (ref 8.9–10.3)
Chloride: 111 mmol/L (ref 98–111)
Creatinine, Ser: 0.76 mg/dL (ref 0.61–1.24)
GFR calc Af Amer: >60 mL/min (ref >60)
GFR calc non Af Amer: >60 mL/min (ref >60)
Glucose, Bld: 118 mg/dL — ABNORMAL HIGH (ref 70–99)
Potassium: 3.3 mmol/L — ABNORMAL LOW (ref 3.5–5.1)
Sodium: 147 mmol/L — ABNORMAL HIGH (ref 135–145)
Total Bilirubin: 0.7 mg/dL (ref 0.3–1.2)
Total Protein: 6.3 g/dL — ABNORMAL LOW (ref 6.5–8.1)

## 2019-03-18 LAB — HEMOGLOBIN A1C
Hgb A1c MFr Bld: 5.3 % (ref 4.8–5.6)
Mean Plasma Glucose: 105.41 mg/dL

## 2019-03-18 LAB — GLUCOSE, CAPILLARY
Glucose-Capillary: 110 mg/dL — ABNORMAL HIGH (ref 70–99)
Glucose-Capillary: 121 mg/dL — ABNORMAL HIGH (ref 70–99)
Glucose-Capillary: 106 mg/dL — ABNORMAL HIGH (ref 70–99)
Glucose-Capillary: 111 mg/dL — ABNORMAL HIGH (ref 70–99)
Glucose-Capillary: 113 mg/dL — ABNORMAL HIGH (ref 70–99)
Glucose-Capillary: 99 mg/dL (ref 70–99)

## 2019-03-18 LAB — LEGIONELLA PNEUMOPHILA SEROGP 1 UR AG: L. pneumophila Serogp 1 Ur Ag: NEGATIVE

## 2019-03-18 LAB — TRIGLYCERIDES: Triglycerides: 168 mg/dL — ABNORMAL HIGH (ref <150)

## 2019-03-18 LAB — MAGNESIUM: Magnesium: 1.9 mg/dL (ref 1.7–2.4)

## 2019-03-18 MED ORDER — PANTOPRAZOLE SODIUM 40 MG PO PACK
40.0000 mg | PACK | Freq: Every day | ORAL | Status: DC
  Filled 2019-03-18: qty 20

## 2019-03-18 MED ORDER — METOLAZONE 2.5 MG PO TABS
2.5000 mg | ORAL_TABLET | Freq: Once | ORAL | Status: DC
  Filled 2019-03-18: qty 1

## 2019-03-18 MED ORDER — LABETALOL HCL 5 MG/ML IV SOLN
10.0000 mg | Freq: Four times a day (QID) | INTRAVENOUS | Status: DC | PRN
  Administered 2019-03-19: 10 mg via INTRAVENOUS
  Filled 2019-03-18: qty 4

## 2019-03-18 MED ORDER — PANTOPRAZOLE SODIUM 40 MG PO TBEC
40.0000 mg | DELAYED_RELEASE_TABLET | Freq: Every day | ORAL | Status: DC
  Administered 2019-03-18 – 2019-03-20 (×3): 40 mg via ORAL
  Filled 2019-03-18 (×3): qty 1

## 2019-03-18 MED ORDER — CHLORHEXIDINE GLUCONATE 0.12 % MT SOLN
15.0000 mL | Freq: Two times a day (BID) | OROMUCOSAL | Status: DC
  Administered 2019-03-18 – 2019-03-20 (×4): 15 mL via OROMUCOSAL
  Filled 2019-03-18 (×3): qty 15

## 2019-03-18 MED ORDER — SODIUM CHLORIDE 0.9 % IV SOLN
2.0000 g | INTRAVENOUS | Status: DC
  Administered 2019-03-18 – 2019-03-20 (×3): 2 g via INTRAVENOUS
  Filled 2019-03-18 (×2): qty 20

## 2019-03-18 MED ORDER — CARVEDILOL 12.5 MG PO TABS
12.5000 mg | ORAL_TABLET | Freq: Two times a day (BID) | ORAL | Status: DC
  Administered 2019-03-18 – 2019-03-21 (×6): 12.5 mg via ORAL
  Filled 2019-03-18 (×6): qty 1

## 2019-03-18 MED ORDER — SODIUM CHLORIDE 0.9 % IV SOLN: INTRAVENOUS | Status: DC | PRN

## 2019-03-18 MED ORDER — POTASSIUM CHLORIDE 20 MEQ/15ML (10%) PO SOLN
20.0000 meq | ORAL | Status: AC
  Administered 2019-03-18 (×2): 20 meq
  Filled 2019-03-18 (×2): qty 15

## 2019-03-18 MED ORDER — POTASSIUM CHLORIDE CRYS ER 20 MEQ PO TBCR
40.0000 meq | EXTENDED_RELEASE_TABLET | Freq: Once | ORAL | Status: AC
  Administered 2019-03-18: 40 meq via ORAL
  Filled 2019-03-18: qty 2

## 2019-03-18 MED ORDER — ORAL CARE MOUTH RINSE
15.0000 mL | Freq: Two times a day (BID) | OROMUCOSAL | Status: DC
  Administered 2019-03-19 – 2019-03-20 (×3): 15 mL via OROMUCOSAL

## 2019-03-18 NOTE — Progress Notes (Addendum)
NAME:  Francis Cardenas, MRN:  [id number], DOB:  1951/04/14, LOS: 2 ADMISSION DATE:  03/16/2019, CONSULTATION DATE: 03/16/2019 REFERRING MD: Dr. Vallery Ridge, CHIEF COMPLAINT: Respiratory failure  Brief History   68 year old gentleman with a history of COPD. EMS activated worsening shortness of breath. Cough of just a days duration, no fevers. PCCM consulted for hypoxic respiratory failure requiring intubation on admission.  History of present illness   History of COPD Was evaluated by EMS this morning following activation for shortness of breath Denies fevers Treated for pneumonia about a year ago He was well up until the day before Limited history was obtained from the patient prior to presentation Transferred on oxygen, placed on CPAP Following continuing decompensation was intubated  Past Medical History  Chronic systolic and diastolic heart failure  Multiple rib fractures  OSA with home CPAP Depression Chronic alcohol abuse  PTSD Peripheral neuropathy  Chronic pain syndrome Carcinoma of prostate 2018 BPH COPD Diabetes type 2 Hyperlipidemia Hypertension   Significant Hospital Events   10/24 >> Intubated in the emergency department for hypoxemic respiratory failure  Consults:  PCCM consulted 03/16/2019  Procedures:  Endotracheal intubation 10/24 >>  Significant Diagnostic Tests:    Micro Data:  Blood cultures 10/24 >>  COVID 10/24 >> neg Urine culture >> neg  Antimicrobials:  Azithromycin 10/24 >> Ceftriaxone 10/24 >>  Interim history/subjective:  No acute events overnight, Currently tolerating wean well. 1.2L urine output overnight, net neg 1L in the last 24hrs.  Objective   Blood pressure (!) 157/69, pulse (!) 57, temperature 98.7 F (37.1 C), temperature source Oral, resp. rate 14, height 5\' 8"  (1.727 m), weight (!) 140.3 kg, SpO2 100 %.    Vent Mode: PSV;CPAP FiO2 (%):  [40 %] 40 % Set Rate:  [16 bmp] 16 bmp Vt Set:  [550 mL] 550 mL PEEP:  [5  cmH20] 5 cmH20 Pressure Support:  [5 cmH20] 5 cmH20 Plateau Pressure:  [11 cmH20-18 cmH20] 16 cmH20   Intake/Output Summary (Last 24 hours) at 03/18/2019 0827 Last data filed at 03/18/2019 [id number] Gross per 24 hour  Intake 4223.06 ml  Output 2575 ml  Net 1648.06 ml   Filed Weights   03/16/19 0711 03/16/19 1300 03/17/19 0336  Weight: 129.3 kg 130.8 kg (!) 140.3 kg    Examination: General: Chronically ill appearing obese elderly maleon mechanical ventilation, in NAD HEENT: ETT, MM pink/moist, PERRL,  Neuro: Alert and oriented on ventilator, able to follow all commands  CV: s1s2 regular rate and rhythm, no murmur, rubs, or gallops,  PULM:  Slightly diminshed breath sounds bilaterally, no added breath sounds  GI: soft, bowel sounds active in all 4 quadrants, non-tender, non-distended, tolerating TF Extremities: warm/dry, 1+ edema  Skin: no rashes or lesions  Resolved Hospital Problem list     Assessment & Plan:  Acute Hypoxemic respiratory failure, POA -In the setting of acute COPD exacerbation and suspected CAP Plan: Ventilator support with 8 cc/kg vent mode with lung protective strategies, can likely extubate today  Wean PEEP and FiO2 for sats greater than 90%. Head of bed elevated 30 degrees. Plateau pressures less than 30 cm H20.  Follow intermittent chest x-ray and ABG.   SAT/SBT as tolerated, mentation preclude extubation  Ensure adequate pulmonary hygiene  Follow cultures  VAP bundle in place  PAD protocol with Fentanyl drip  Suspected CAP with possible sepsis, gp or atypical, POA Plan: Continue antibiotics  As needed bronchodilators   HFpEF, acute decompensation HX HTN  -EF 50% ECHO 10/25,  BNP 403 10/24 -last echo 2019 Plan:  Continue home beta blocker, ASA, ACEi, statin  Continuous telemetry  Strict intake and output  Daily weight Continue IV lasix Monitor renal function and electrolytes   Alcoholism Plan:  Monitor for signs of withdrawal Supplemental  thiamine, folate, and multivitamin    RUE swelling:  -Venus doppler 10/28 negative for acute DVT Plan: Elevate extremity  IV diuretics as above  Diabetes 2 with hyperglycemia (per report) A1C 5.4 12/30/2017 Plan:  SSI CBG checks ACHS Hypoglycemia protocol as needed  Check A1c  History of obstructive sleep apnea -unclear if utilizes NIV at home Plan: Continue vent support as above  Clarify use of CPAP once extubated  May need Pulmonary follow up as discharge   Hypokalemia/Hypernatermia  Plan: Provide supplementation   Chronic normocytic anemia Thrombocytopenia  -No overt blood loss Plan: Transfuse per protocol Goal Hgb > 7  Morbid obesity Plan:  Nutritional consult  Provide education when mentation allows   Best practice:  Diet:  tube feeding Pain/Anxiety/Delirium protocol (if indicated): Ffentanyl VAP protocol (if indicated): In place DVT prophylaxis: Lovenox GI prophylaxis: Protonix Glucose control: SSI Mobility: Bedrest Code Status: Full code Family Communication: Plan for update today  Disposition: ICU  Labs   CBC: Recent Labs  Lab 03/16/19 0705 03/16/19 0853 03/16/19 1134 03/17/19 0311 03/17/19 0509 03/18/19 0059  WBC 11.3*  --  8.5 7.4  --  5.3  NEUTROABS 9.6*  --   --   --   --   --   HGB 14.0 13.3 13.5 12.3* 10.9* 11.6*  HCT 42.3 39.0 40.0 36.7* 32.0* 35.4*  MCV 97.2  --  95.5 96.3  --  98.3  PLT 159  --  131* 135*  --  124*    Basic Metabolic Panel: Recent Labs  Lab 03/16/19 0705 03/16/19 0853 03/16/19 1134 03/17/19 0311 03/17/19 0509 03/18/19 0059  NA 140 140  --  141 141 147*  K 3.6 3.6  --  3.2* 3.1* 3.3*  CL 104  --   --  105  --  111  CO2 24  --   --  24  --  26  GLUCOSE 232*  --   --  133*  --  118*  BUN 9  --   --  10  --  13  CREATININE 0.81  --  0.71 0.75  --  0.76  CALCIUM 10.0  --   --  9.6  --  9.3  MG  --   --   --  2.0  --  1.9  PHOS  --   --   --  3.0  --   --    GFR: Estimated Creatinine Clearance: 118.1  mL/min (by C-G formula based on SCr of 0.76 mg/dL). Recent Labs  Lab 03/16/19 0705 03/16/19 1134 03/17/19 0311 03/18/19 0059  WBC 11.3* 8.5 7.4 5.3  LATICACIDVEN 1.7  --   --   --     Liver Function Tests: Recent Labs  Lab 03/16/19 0705 03/18/19 0059  AST 38 17  ALT 37 22  ALKPHOS 116 58  BILITOT 0.5 0.7  PROT 7.1 6.3*  ALBUMIN 4.2 3.5   No results for input(s): LIPASE, AMYLASE in the last 168 hours. No results for input(s): AMMONIA in the last 168 hours.  ABG    Component Value Date/Time   PHART 7.426 03/17/2019 0509   PCO2ART 40.4 03/17/2019 0509   PO2ART 71.0 (L) 03/17/2019 0509   HCO3 26.6 03/17/2019  0509   TCO2 28 03/17/2019 0509   ACIDBASEDEF 1.0 12/10/2018 1813   O2SAT 95.0 03/17/2019 0509     Coagulation Profile: Recent Labs  Lab 03/16/19 0924  INR 1.0    Cardiac Enzymes: No results for input(s): CKTOTAL, CKMB, CKMBINDEX, TROPONINI in the last 168 hours.  HbA1C: Hgb A1c MFr Bld  Date/Time Value Ref Range Status  12/30/2017 08:18 AM 5.4 4.8 - 5.6 % Final    Comment:    (NOTE) Pre diabetes:          5.7%-6.4% Diabetes:              >6.4% Glycemic control for   <7.0% adults with diabetes     CBG: Recent Labs  Lab 03/17/19 1520 03/17/19 1958 03/17/19 2346 03/18/19 0401 03/18/19 0714  GLUCAP 119* 112* 108* 110* 121*    Critical care time:    Performed by: Johnsie Cancel   Total critical care time: 40 minutes  Critical care time was exclusive of separately billable procedures and treating other patients.  Critical care was necessary to treat or prevent imminent or life-threatening deterioration.  Critical care was time spent personally by me on the following activities: development of treatment plan with patient and/or surrogate as well as nursing, discussions with consultants, evaluation of patient's response to treatment, examination of patient, obtaining history from patient or surrogate, ordering and performing treatments and  interventions, ordering and review of laboratory studies, ordering and review of radiographic studies, pulse oximetry and re-evaluation of patient's condition.  Signature:    Johnsie Cancel, NP-C [place] [facility] Contact information can be found on Amion  After hours pager: [phone]. 03/18/2019, 8:54 AM

## 2019-03-18 NOTE — Progress Notes (Signed)
[facility] ADULT ICU REPLACEMENT PROTOCOL FOR AM LAB REPLACEMENT ONLY  The patient does apply for the [facility] Adult ICU Electrolyte Replacment Protocol based on the criteria listed below:   1. Is GFR >/= 40 ml/min? Yes.    Patient's GFR today is >60 2. Is urine output >/= 0.5 ml/kg/hr for the last 6 hours? Yes.   Patient's UOP is 0.7 ml/kg/hr 3. Is BUN < 60 mg/dL? Yes.    Patient's BUN today is 13 4. Abnormal electrolyte(s):k 3.3 5. Ordered repletion with: protocol 6. If a panic level lab has been reported, has the CCM MD in charge been notified? No..   Physician:    Ronda Fairly A 03/18/2019 3:41 AM

## 2019-03-18 NOTE — Progress Notes (Signed)
RT NOTES: Patient wanted to take a nap, sats on 6lpm nasal cannula are 90%. Placed patient on bipap at this time. Resting comfortably, sats now 94%. Will continue to monitor.

## 2019-03-18 NOTE — Procedures (Signed)
Extubation Procedure Note  Patient Details:   Name: Francis Cardenas DOB: 1951-05-21 MRN: [id number]   Airway Documentation:   Patient extubated per orders, placed on 4lpm humidified oxygen. Patient had positive cuff leak prior to extubation. Patient tolerating well. Will continue to monitor. Vent end date: 03/18/19 Vent end time: 1045   Evaluation  O2 sats: stable throughout Complications: No apparent complications Patient did tolerate procedure well. Bilateral Breath Sounds: Clear, Diminished   Yes  Ander Purpura 03/18/2019, 10:46 AM

## 2019-03-19 DIAGNOSIS — J9602 Acute respiratory failure with hypercapnia: Secondary | ICD-10-CM

## 2019-03-19 DIAGNOSIS — J189 Pneumonia, unspecified organism: Secondary | ICD-10-CM | POA: Diagnosis not present

## 2019-03-19 DIAGNOSIS — J9601 Acute respiratory failure with hypoxia: Secondary | ICD-10-CM | POA: Diagnosis not present

## 2019-03-19 LAB — GLUCOSE, CAPILLARY
Glucose-Capillary: 100 mg/dL — ABNORMAL HIGH (ref 70–99)
Glucose-Capillary: 105 mg/dL — ABNORMAL HIGH (ref 70–99)
Glucose-Capillary: 107 mg/dL — ABNORMAL HIGH (ref 70–99)
Glucose-Capillary: 143 mg/dL — ABNORMAL HIGH (ref 70–99)
Glucose-Capillary: 150 mg/dL — ABNORMAL HIGH (ref 70–99)
Glucose-Capillary: 94 mg/dL (ref 70–99)

## 2019-03-19 LAB — CBC
HCT: 37.5 % — ABNORMAL LOW (ref 39.0–52.0)
Hemoglobin: 12 g/dL — ABNORMAL LOW (ref 13.0–17.0)
MCH: 32.2 pg (ref 26.0–34.0)
MCHC: 32 g/dL (ref 30.0–36.0)
MCV: 100.5 fL — ABNORMAL HIGH (ref 80.0–100.0)
Platelets: 128 10*3/uL — ABNORMAL LOW (ref 150–400)
RBC: 3.73 MIL/uL — ABNORMAL LOW (ref 4.22–5.81)
RDW: 14.6 % (ref 11.5–15.5)
WBC: 7.4 10*3/uL (ref 4.0–10.5)
nRBC: 0 % (ref 0.0–0.2)

## 2019-03-19 LAB — COMPREHENSIVE METABOLIC PANEL
ALT: 19 U/L (ref 0–44)
AST: 17 U/L (ref 15–41)
Albumin: 3.6 g/dL (ref 3.5–5.0)
Alkaline Phosphatase: 55 U/L (ref 38–126)
Anion gap: 11 (ref 5–15)
BUN: 12 mg/dL (ref 8–23)
CO2: 31 mmol/L (ref 22–32)
Calcium: 9.6 mg/dL (ref 8.9–10.3)
Chloride: 105 mmol/L (ref 98–111)
Creatinine, Ser: 0.63 mg/dL (ref 0.61–1.24)
GFR calc Af Amer: >60 mL/min (ref >60)
GFR calc non Af Amer: >60 mL/min (ref >60)
Glucose, Bld: 110 mg/dL — ABNORMAL HIGH (ref 70–99)
Potassium: 3.7 mmol/L (ref 3.5–5.1)
Sodium: 147 mmol/L — ABNORMAL HIGH (ref 135–145)
Total Bilirubin: 0.9 mg/dL (ref 0.3–1.2)
Total Protein: 6.6 g/dL (ref 6.5–8.1)

## 2019-03-19 LAB — PROCALCITONIN: Procalcitonin: <0.1 ng/mL

## 2019-03-19 LAB — BRAIN NATRIURETIC PEPTIDE: B Natriuretic Peptide: 1091.9 pg/mL — ABNORMAL HIGH (ref 0.0–100.0)

## 2019-03-19 MED ORDER — IPRATROPIUM-ALBUTEROL 0.5-2.5 (3) MG/3ML IN SOLN
3.0000 mL | Freq: Four times a day (QID) | RESPIRATORY_TRACT | Status: DC
  Administered 2019-03-19: 3 mL via RESPIRATORY_TRACT
  Filled 2019-03-19 (×3): qty 3

## 2019-03-19 MED ORDER — CHLORDIAZEPOXIDE HCL 25 MG PO CAPS
25.0000 mg | ORAL_CAPSULE | Freq: Four times a day (QID) | ORAL | Status: AC
  Administered 2019-03-20 (×3): 25 mg via ORAL
  Filled 2019-03-19 (×3): qty 1

## 2019-03-19 MED ORDER — INFLUENZA VAC A&B SA ADJ QUAD 0.5 ML IM PRSY
0.5000 mL | PREFILLED_SYRINGE | INTRAMUSCULAR | Status: DC
  Filled 2019-03-19: qty 0.5

## 2019-03-19 MED ORDER — LORAZEPAM 2 MG/ML IJ SOLN: 2.0000 mg | Freq: Four times a day (QID) | INTRAMUSCULAR | Status: DC

## 2019-03-19 MED ORDER — CHLORDIAZEPOXIDE HCL 25 MG PO CAPS: 25.0000 mg | ORAL_CAPSULE | ORAL | Status: DC

## 2019-03-19 MED ORDER — LOPERAMIDE HCL 2 MG PO CAPS
2.0000 mg | ORAL_CAPSULE | ORAL | Status: DC | PRN
  Filled 2019-03-19: qty 2

## 2019-03-19 MED ORDER — LORAZEPAM 2 MG/ML IJ SOLN
2.0000 mg | Freq: Once | INTRAMUSCULAR | Status: AC
  Administered 2019-03-19: 2 mg via INTRAVENOUS

## 2019-03-19 MED ORDER — HYDROXYZINE HCL 25 MG PO TABS: 25.0000 mg | ORAL_TABLET | Freq: Four times a day (QID) | ORAL | Status: DC | PRN

## 2019-03-19 MED ORDER — CHLORDIAZEPOXIDE HCL 25 MG PO CAPS: 25.0000 mg | ORAL_CAPSULE | Freq: Every day | ORAL | Status: DC

## 2019-03-19 MED ORDER — CHLORDIAZEPOXIDE HCL 25 MG PO CAPS: 25.0000 mg | ORAL_CAPSULE | Freq: Four times a day (QID) | ORAL | Status: DC | PRN

## 2019-03-19 MED ORDER — CHLORDIAZEPOXIDE HCL 25 MG PO CAPS
25.0000 mg | ORAL_CAPSULE | Freq: Three times a day (TID) | ORAL | Status: DC
  Administered 2019-03-20 – 2019-03-21 (×2): 25 mg via ORAL
  Filled 2019-03-19 (×2): qty 1

## 2019-03-19 MED ORDER — PREDNISONE 50 MG PO TABS
50.0000 mg | ORAL_TABLET | Freq: Every day | ORAL | Status: DC
  Administered 2019-03-19 – 2019-03-21 (×3): 50 mg via ORAL
  Filled 2019-03-19 (×3): qty 1

## 2019-03-19 MED ORDER — CHLORDIAZEPOXIDE HCL 25 MG PO CAPS
25.0000 mg | ORAL_CAPSULE | Freq: Once | ORAL | Status: AC
  Administered 2019-03-19: 25 mg via ORAL
  Filled 2019-03-19: qty 1

## 2019-03-19 MED ORDER — ONDANSETRON 4 MG PO TBDP
4.0000 mg | ORAL_TABLET | Freq: Four times a day (QID) | ORAL | Status: DC | PRN
  Filled 2019-03-19: qty 1

## 2019-03-19 MED ORDER — PNEUMOCOCCAL VAC POLYVALENT 25 MCG/0.5ML IJ INJ
0.5000 mL | INJECTION | INTRAMUSCULAR | Status: DC
  Filled 2019-03-19: qty 0.5

## 2019-03-19 MED ORDER — LORAZEPAM 2 MG/ML IJ SOLN: 1.0000 mg | Freq: Four times a day (QID) | INTRAMUSCULAR | Status: DC

## 2019-03-19 MED ORDER — AZITHROMYCIN 250 MG PO TABS
250.0000 mg | ORAL_TABLET | Freq: Every day | ORAL | Status: DC
  Administered 2019-03-19 – 2019-03-20 (×2): 250 mg via ORAL
  Filled 2019-03-19 (×2): qty 1

## 2019-03-19 MED ORDER — LORAZEPAM 2 MG/ML IJ SOLN
INTRAMUSCULAR | Status: AC
  Filled 2019-03-19: qty 1

## 2019-03-19 NOTE — Progress Notes (Signed)
PROGRESS NOTE    Francis Cardenas  [id number] DOB: 03-15-1951 DOA: 03/16/2019 PCP: Francis Sacramento, MD   Brief Narrative:  68 year old gentleman with a history of COPD was brought into the emergency department with shortness of breath.  He was emergently intubated in the emergency department and was diagnosed with COPD exacerbation as well as community-acquired pneumonia.  Was admitted under PCCM.  Extubated on 03/18/2019.  Transferred to [facility] on 03/19/2019.  Assessment & Plan:   Active Problems:   Respiratory failure, acute ([place])   Community acquired pneumonia  Acute hypoxic and hypercapnic respiratory failure secondary to acute COPD exacerbation and community-acquired pneumonia: Patient was admitted by [facility] and was intubated emergently in the emergency department.  Extubated on 03/18/2019.  Looking at chart review, it seems like he has received 1 dose of Solu-Medrol on 24th and 25 October but none since.  He has been started on IV Rocephin which I will continue and will add azithromycin for anti-inflammatory effect.  I do not see any procalcitonin, will obtain 1.  Recent chest x-ray from yesterday showed vascular congestion but no opacity.  We will also get proBNP.  Echo does not show systolic or diastolic dysfunction or wall motion abnormality.  No wheezes on exam but diminished breath sounds.  Will start on prednisone 50 mg p.o. daily.  Essential hypertension: Controlled.  Continue current management.  History of alcohol dependence: Monitor for withdrawal.  No withdrawal symptoms at this point in time.  Right upper extremity swelling: Minimal swelling.  DVT ruled out.  Obstructive sleep apnea: CPAP at night.  DVT prophylaxis: Lovenox Code Status: Full code Family Communication:  None present at bedside.  Plan of care discussed with patient in length and he verbalized understanding and agreed with it. Disposition Plan: TBD.  Consulting PT OT.  Estimated body mass index is 46.16  kg/m as calculated from the following:   Height as of this encounter: 5\' 8"  (1.727 m).   Weight as of this encounter: 137.7 kg.      Nutritional status:  Nutrition Problem: Inadequate oral intake Etiology: inability to eat   Signs/Symptoms: NPO status   Interventions: Tube feeding, Prostat    Consultants:   None  Procedures:   Intubation  Antimicrobials:   Azithromycin   Subjective: Patient seen and examined in ICU.  He feels better.  Denied any shortness of breath.  Does not use any home oxygen but uses night CPAP.  He kept talking about several other things unrelated to his health.  Objective: Vitals:   03/19/19 0800 03/19/19 0900 03/19/19 1000 03/19/19 1100  BP: (!) 149/79 (!) 144/129 (!) 172/75   Pulse: 69 97 85 88  Resp: 12 (!) 24 (!) 21 19  Temp:    98.4 F (36.9 C)  TempSrc:    Oral  SpO2: 99% 94% 99% 98%  Weight:      Height:        Intake/Output Summary (Last 24 hours) at 03/19/2019 1301 Last data filed at 03/19/2019 1100 Gross per 24 hour  Intake 1104.71 ml  Output 6550 ml  Net -5445.29 ml   Filed Weights   03/17/19 0336 03/18/19 0500 03/19/19 0400  Weight: (!) 140.3 kg (!) 140.3 kg (!) 137.7 kg    Examination:  General exam: Appears calm and comfortable, obese Respiratory system: Diminished breath sounds but no wheezes or crackles. Respiratory effort normal. Cardiovascular system: S1 & S2 heard, RRR. No JVD, murmurs, rubs, gallops or clicks. No pedal edema. Gastrointestinal system:  Abdomen is nondistended, soft and nontender. No organomegaly or masses felt. Normal bowel sounds heard. Central nervous system: Alert and oriented. No focal neurological deficits. Extremities: Symmetric 5 x 5 power. Skin: No rashes, lesions or ulcers.  Psychiatry: Judgement and insight appear normal. Mood & affect appropriate.    Data Reviewed: I have personally reviewed following labs and imaging studies  CBC: Recent Labs  Lab 03/16/19 0705   03/16/19 1134 03/17/19 0311 03/17/19 0509 03/18/19 0059 03/19/19 0333  WBC 11.3*  --  8.5 7.4  --  5.3 7.4  NEUTROABS 9.6*  --   --   --   --   --   --   HGB 14.0   < > 13.5 12.3* 10.9* 11.6* 12.0*  HCT 42.3   < > 40.0 36.7* 32.0* 35.4* 37.5*  MCV 97.2  --  95.5 96.3  --  98.3 100.5*  PLT 159  --  131* 135*  --  124* 128*   < > = values in this interval not displayed.   Basic Metabolic Panel: Recent Labs  Lab 03/16/19 0705 03/16/19 0853 03/16/19 1134 03/17/19 0311 03/17/19 0509 03/18/19 0059 03/19/19 0333  NA 140 140  --  141 141 147* 147*  K 3.6 3.6  --  3.2* 3.1* 3.3* 3.7  CL 104  --   --  105  --  111 105  CO2 24  --   --  24  --  26 31  GLUCOSE 232*  --   --  133*  --  118* 110*  BUN 9  --   --  10  --  13 12  CREATININE 0.81  --  0.71 0.75  --  0.76 0.63  CALCIUM 10.0  --   --  9.6  --  9.3 9.6  MG  --   --   --  2.0  --  1.9  --   PHOS  --   --   --  3.0  --   --   --    GFR: Estimated Creatinine Clearance: 116.8 mL/min (by C-G formula based on SCr of 0.63 mg/dL). Liver Function Tests: Recent Labs  Lab 03/16/19 0705 03/18/19 0059 03/19/19 0333  AST 38 17 17  ALT 37 22 19  ALKPHOS 116 58 55  BILITOT 0.5 0.7 0.9  PROT 7.1 6.3* 6.6  ALBUMIN 4.2 3.5 3.6   No results for input(s): LIPASE, AMYLASE in the last 168 hours. No results for input(s): AMMONIA in the last 168 hours. Coagulation Profile: Recent Labs  Lab 03/16/19 0924  INR 1.0   Cardiac Enzymes: No results for input(s): CKTOTAL, CKMB, CKMBINDEX, TROPONINI in the last 168 hours. BNP (last 3 results) No results for input(s): PROBNP in the last 8760 hours. HbA1C: Recent Labs    03/18/19 0059  HGBA1C 5.3   CBG: Recent Labs  Lab 03/18/19 1922 03/18/19 2309 03/19/19 0307 03/19/19 0716 03/19/19 1121  GLUCAP 113* 99 100* 107* 94   Lipid Profile: Recent Labs    03/17/19 0311 03/18/19 0059  TRIG 169* 168*   Thyroid Function Tests: No results for input(s): TSH, T4TOTAL, FREET4,  T3FREE, THYROIDAB in the last 72 hours. Anemia Panel: No results for input(s): VITAMINB12, FOLATE, FERRITIN, TIBC, IRON, RETICCTPCT in the last 72 hours. Sepsis Labs: Recent Labs  Lab 03/16/19 0705  LATICACIDVEN 1.7    Recent Results (from the past 240 hour(s))  SARS Coronavirus 2 by RT PCR (hospital order, performed in [facility] hospital lab)  Nasopharyngeal Nasopharyngeal Swab     Status: None   Collection Time: 03/16/19  6:58 AM   Specimen: Nasopharyngeal Swab  Result Value Ref Range Status   SARS Coronavirus 2 NEGATIVE NEGATIVE Final    Comment: (NOTE) If result is NEGATIVE SARS-CoV-2 target nucleic acids are NOT DETECTED. The SARS-CoV-2 RNA is generally detectable in upper and lower  respiratory specimens during the acute phase of infection. The lowest  concentration of SARS-CoV-2 viral copies this assay can detect is 250  copies / mL. A negative result does not preclude SARS-CoV-2 infection  and should not be used as the sole basis for treatment or other  patient management decisions.  A negative result may occur with  improper specimen collection / handling, submission of specimen other  than nasopharyngeal swab, presence of viral mutation(s) within the  areas targeted by this assay, and inadequate number of viral copies  (<250 copies / mL). A negative result must be combined with clinical  observations, patient history, and epidemiological information. If result is POSITIVE SARS-CoV-2 target nucleic acids are DETECTED. The SARS-CoV-2 RNA is generally detectable in upper and lower  respiratory specimens dur ing the acute phase of infection.  Positive  results are indicative of active infection with SARS-CoV-2.  Clinical  correlation with patient history and other diagnostic information is  necessary to determine patient infection status.  Positive results do  not rule out bacterial infection or co-infection with other viruses. If result is PRESUMPTIVE  POSTIVE SARS-CoV-2 nucleic acids MAY BE PRESENT.   A presumptive positive result was obtained on the submitted specimen  and confirmed on repeat testing.  While 2019 novel coronavirus  (SARS-CoV-2) nucleic acids may be present in the submitted sample  additional confirmatory testing may be necessary for epidemiological  and / or clinical management purposes  to differentiate between  SARS-CoV-2 and other Sarbecovirus currently known to infect humans.  If clinically indicated additional testing with an alternate test  methodology [id number]) is advised. The SARS-CoV-2 RNA is generally  detectable in upper and lower respiratory sp ecimens during the acute  phase of infection. The expected result is Negative. Fact Sheet for Patients:  StrictlyIdeas.no Fact Sheet for Healthcare Providers: BankingDealers.co.za This test is not yet approved or cleared by the [place] FDA and has been authorized for detection and/or diagnosis of SARS-CoV-2 by FDA under an Emergency Use Authorization (EUA).  This EUA will remain in effect (meaning this test can be used) for the duration of the COVID-19 declaration under Section 564(b)(1) of the Act, 21 U.S.C. section 360bbb-3(b)(1), unless the authorization is terminated or revoked sooner. Performed at [facility], [place] [address] [place], [place] [address]   Culture, blood (routine x 2)     Status: None (Preliminary result)   Collection Time: 03/16/19  7:05 AM   Specimen: BLOOD RIGHT HAND  Result Value Ref Range Status   Specimen Description BLOOD RIGHT HAND  Final   Special Requests   Final    BOTTLES DRAWN AEROBIC AND ANAEROBIC Blood Culture adequate volume   Culture   Final    NO GROWTH 3 DAYS Performed at [facility], [place] [address] [place], [place] [address]    Report Status PENDING  Incomplete  Culture, blood (routine x 2)     Status: None (Preliminary result)   Collection Time:  03/16/19  8:00 AM   Specimen: BLOOD LEFT HAND  Result Value Ref Range Status   Specimen Description BLOOD LEFT HAND  Final   Special Requests   Final    BOTTLES DRAWN AEROBIC AND ANAEROBIC Blood Culture adequate volume   Culture   Final    NO GROWTH 3 DAYS Performed at [facility], [address] [place], [place] [address]    Report Status PENDING  Incomplete  Urine culture     Status: Abnormal   Collection Time: 03/16/19 10:45 AM   Specimen: In/Out Cath Urine  Result Value Ref Range Status   Specimen Description IN/OUT CATH URINE  Final   Special Requests NONE  Final   Culture (A)  Final    10,000 COLONIES/mL STAPHYLOCOCCUS SPECIES (COAGULASE NEGATIVE) CALL MICROBIOLOGY LAB IF SENSITIVITIES ARE REQUIRED. Performed at [facility], [place] [address], [place], [place] [address]    Report Status 03/17/2019 FINAL  Final  MRSA PCR Screening     Status: Abnormal   Collection Time: 03/16/19  1:02 PM   Specimen: Nasal Mucosa; Nasopharyngeal  Result Value Ref Range Status   MRSA by PCR POSITIVE (A) NEGATIVE Final    Comment:        The GeneXpert MRSA Assay (FDA approved for NASAL specimens only), is one component of a comprehensive MRSA colonization surveillance program. It is not intended to diagnose MRSA infection nor to guide or monitor treatment for MRSA infections. CRITICAL RESULT CALLED TO, READ BACK BY AND VERIFIED WITH: Andreas Newport, RN AT 1510 ON 03/16/19 BY C. JESSUP, MT. Performed at [facility], [place] [address] [place], [place] [address]       Radiology Studies: Dg Chest Port 1 View  Result Date: 03/18/2019 CLINICAL DATA:  Respiratory failure. EXAM: PORTABLE CHEST 1 VIEW COMPARISON:  03/17/2019 FINDINGS: Endotracheal tube has tip 3 cm above the carina. Enteric tube courses into the region of the stomach and off the film as tip is not visualized. Lungs are adequately inflated and demonstrate mild hazy prominence of the perihilar/infrahilar  vasculature right greater than left with slight interval improvement. No effusion. Cardiomediastinal silhouette and remainder of the exam is unchanged. IMPRESSION: Findings suggesting mild vascular congestion with some interval improvement. Tubes and lines as described. Electronically Signed   By: Marin Olp M.D.   On: 03/18/2019 07:27    Scheduled Meds:  amLODipine  10 mg Oral Q breakfast   aspirin  81 mg Per Tube Daily   atorvastatin  40 mg Oral Q supper   carvedilol  12.5 mg Oral BID WC   chlorhexidine  15 mL Mouth Rinse BID   Chlorhexidine Gluconate Cloth  6 each Topical Daily   cholecalciferol  1,000 Units Oral Q supper   enoxaparin (LOVENOX) injection  40 mg Subcutaneous [id number]   folic acid  1 mg Oral Daily   furosemide  60 mg Oral TID   gabapentin  300 mg Oral TID   [START ON 03/20/2019] influenza vaccine adjuvanted  0.5 mL Intramuscular Tomorrow-1000   insulin aspart  3-9 Units Subcutaneous Q4H   lisinopril  40 mg Oral Daily   mouth rinse  15 mL Mouth Rinse q12n4p   meloxicam  15 mg Oral QHS   metolazone  2.5 mg Oral Once   multivitamin with minerals  1 tablet Oral Q lunch   mupirocin ointment  1 application Nasal BID   pantoprazole  40 mg Oral QHS   [START ON 03/20/2019] pneumococcal 23 valent vaccine  0.5 mL Intramuscular Tomorrow-1000   sertraline  150 mg Oral Q breakfast   thiamine  100 mg Oral  Daily   Continuous Infusions:  sodium chloride 10 mL/hr at 03/18/19 2000   cefTRIAXone (ROCEPHIN)  IV Stopped (03/18/19 1751)     LOS: 3 days   Time spent: 35 minutes   Darliss Cheney, MD [facility]  03/19/2019, 1:01 PM   To contact the attending provider between 7A-7P or the covering provider during after hours 7P-7A, please log into the web site [email or web address] and use password [email or web address].

## 2019-03-20 DIAGNOSIS — J9602 Acute respiratory failure with hypercapnia: Secondary | ICD-10-CM | POA: Diagnosis not present

## 2019-03-20 DIAGNOSIS — J189 Pneumonia, unspecified organism: Secondary | ICD-10-CM | POA: Diagnosis not present

## 2019-03-20 DIAGNOSIS — J9601 Acute respiratory failure with hypoxia: Secondary | ICD-10-CM | POA: Diagnosis not present

## 2019-03-20 LAB — COMPREHENSIVE METABOLIC PANEL
ALT: 19 U/L (ref 0–44)
AST: 17 U/L (ref 15–41)
Albumin: 3.5 g/dL (ref 3.5–5.0)
Alkaline Phosphatase: 58 U/L (ref 38–126)
Anion gap: 11 (ref 5–15)
BUN: 15 mg/dL (ref 8–23)
CO2: 32 mmol/L (ref 22–32)
Calcium: 9.9 mg/dL (ref 8.9–10.3)
Chloride: 97 mmol/L — ABNORMAL LOW (ref 98–111)
Creatinine, Ser: 0.66 mg/dL (ref 0.61–1.24)
GFR calc Af Amer: >60 mL/min (ref >60)
GFR calc non Af Amer: >60 mL/min (ref >60)
Glucose, Bld: 153 mg/dL — ABNORMAL HIGH (ref 70–99)
Potassium: 3.1 mmol/L — ABNORMAL LOW (ref 3.5–5.1)
Sodium: 140 mmol/L (ref 135–145)
Total Bilirubin: 0.9 mg/dL (ref 0.3–1.2)
Total Protein: 6.7 g/dL (ref 6.5–8.1)

## 2019-03-20 LAB — CBC
HCT: 35.4 % — ABNORMAL LOW (ref 39.0–52.0)
Hemoglobin: 12 g/dL — ABNORMAL LOW (ref 13.0–17.0)
MCH: 32.1 pg (ref 26.0–34.0)
MCHC: 33.9 g/dL (ref 30.0–36.0)
MCV: 94.7 fL (ref 80.0–100.0)
Platelets: 142 10*3/uL — ABNORMAL LOW (ref 150–400)
RBC: 3.74 MIL/uL — ABNORMAL LOW (ref 4.22–5.81)
RDW: 13.9 % (ref 11.5–15.5)
WBC: 6.9 10*3/uL (ref 4.0–10.5)
nRBC: 0 % (ref 0.0–0.2)

## 2019-03-20 LAB — GLUCOSE, CAPILLARY
Glucose-Capillary: 150 mg/dL — ABNORMAL HIGH (ref 70–99)
Glucose-Capillary: 124 mg/dL — ABNORMAL HIGH (ref 70–99)
Glucose-Capillary: 156 mg/dL — ABNORMAL HIGH (ref 70–99)
Glucose-Capillary: 196 mg/dL — ABNORMAL HIGH (ref 70–99)
Glucose-Capillary: 194 mg/dL — ABNORMAL HIGH (ref 70–99)
Glucose-Capillary: 143 mg/dL — ABNORMAL HIGH (ref 70–99)

## 2019-03-20 LAB — MAGNESIUM: Magnesium: 2 mg/dL (ref 1.7–2.4)

## 2019-03-20 MED ORDER — ENOXAPARIN SODIUM 40 MG/0.4ML ~~LOC~~ SOLN: 40.0000 mg | SUBCUTANEOUS | Status: DC

## 2019-03-20 MED ORDER — POTASSIUM CHLORIDE CRYS ER 20 MEQ PO TBCR
20.0000 meq | EXTENDED_RELEASE_TABLET | Freq: Two times a day (BID) | ORAL | Status: DC
  Administered 2019-03-20 (×2): 20 meq via ORAL
  Filled 2019-03-20 (×2): qty 1

## 2019-03-20 MED ORDER — ALPRAZOLAM 0.25 MG PO TABS: 0.2500 mg | ORAL_TABLET | Freq: Once | ORAL | Status: DC

## 2019-03-20 MED ORDER — FUROSEMIDE 40 MG PO TABS
40.0000 mg | ORAL_TABLET | Freq: Two times a day (BID) | ORAL | Status: DC
  Administered 2019-03-20 – 2019-03-21 (×2): 40 mg via ORAL
  Filled 2019-03-20 (×2): qty 1

## 2019-03-20 MED ORDER — ENOXAPARIN SODIUM 60 MG/0.6ML ~~LOC~~ SOLN
60.0000 mg | SUBCUTANEOUS | Status: DC
  Administered 2019-03-20: 60 mg via SUBCUTANEOUS
  Filled 2019-03-20 (×2): qty 0.6

## 2019-03-20 MED ORDER — ALPRAZOLAM 0.5 MG PO TABS
0.5000 mg | ORAL_TABLET | Freq: Once | ORAL | Status: AC
  Administered 2019-03-20: 22:00:00 0.5 mg via ORAL
  Filled 2019-03-20: qty 1

## 2019-03-20 MED ORDER — IPRATROPIUM-ALBUTEROL 0.5-2.5 (3) MG/3ML IN SOLN: 3.0000 mL | Freq: Four times a day (QID) | RESPIRATORY_TRACT | Status: DC | PRN

## 2019-03-20 NOTE — Progress Notes (Signed)
PROGRESS NOTE    Francis Cardenas  [id number] DOB: 1950-09-07 DOA: 03/16/2019 PCP: Christain Sacramento, MD    Brief Narrative:  68 year old gentleman with history of COPD presented to ER with shortness of breath.  Was emergently intubated in the emergency department due to worsening shortness of breath not responding to CPAP.  Admitted to intensive care unit and treated with intubation, extubated to nasal cannula oxygen.   Assessment & Plan:   Active Problems:   Respiratory failure, acute ([place])   Community acquired pneumonia  Acute hypoxic and hypercapnic respiratory failure secondary to acute COPD exacerbation: Status post mechanical ventilation, extubated on 03/18/2019. Continue bronchodilator therapy, oral steroids, inhalational steroids.  Start aggressive deep breathing exercises, incentive spirometry, chest physiotherapy.   Received 5 days of Rocephin and azithromycin, discontinue.   Supplemental oxygen to keep saturations more than 92%. Advance activities. We will send a pulmonology referral on discharge for long-term COPD management.  Hypertension: Blood pressure is stable on current regimen.  History of alcohol use: No evidence of withdrawal so far.  On CIWA scale and as needed benzodiazepine.  Obstructive sleep apnea: Uses CPAP at night.  Hypokalemia: Replace and monitor levels.   DVT prophylaxis: Lovenox Code Status: Full code Family Communication: None Disposition Plan: Home after hospitalization.  Anticipate tomorrow.  Can transfer to [place] bed.   Consultants:   PCCM,  Procedures:   Intubation,  Antimicrobials:   Rocephin, azithromycin, 03/16/2019-03/12/2019   Subjective: Patient seen and examined.  Complains of thick sticky cough and suctioning out.  No chest pain.  No fever.  On room air.  Later today, he was able to ambulate around in the hallway with no obvious distress.  Objective: Vitals:   03/20/19 1400 03/20/19 1500 03/20/19 1600 03/20/19  1615  BP:    (!) 164/78  Pulse: 85 65 71 65  Resp: 20 15 19 16   Temp:  (!) 96 F (35.6 C)    TempSrc:  Axillary    SpO2: (!) 88% 98% 96% 100%  Weight:      Height:        Intake/Output Summary (Last 24 hours) at 03/20/2019 1643 Last data filed at 03/20/2019 1600 Gross per 24 hour  Intake 1050 ml  Output 725 ml  Net 325 ml   Filed Weights   03/18/19 0500 03/19/19 0400 03/20/19 0401  Weight: (!) 140.3 kg (!) 137.7 kg 135.5 kg    Examination:  General exam: Appears calm and comfortable, on room air. Respiratory system: Clear to auscultation. Respiratory effort normal.  Occasional expiratory wheezes.  Upper airway sounds. Cardiovascular system: S1 & S2 heard, RRR. No JVD, murmurs, rubs, gallops or clicks. No pedal edema. Gastrointestinal system: Abdomen is nondistended, soft and nontender. No organomegaly or masses felt. Normal bowel sounds heard.  Obese and pendulous. Central nervous system: Alert and oriented. No focal neurological deficits. Extremities: Symmetric 5 x 5 power. Skin: No rashes, lesions or ulcers Psychiatry: Judgement and insight appear normal. Mood & affect appropriate.     Data Reviewed: I have personally reviewed following labs and imaging studies  CBC: Recent Labs  Lab 03/16/19 0705  03/16/19 1134 03/17/19 0311 03/17/19 0509 03/18/19 0059 03/19/19 0333 03/20/19 0317  WBC 11.3*  --  8.5 7.4  --  5.3 7.4 6.9  NEUTROABS 9.6*  --   --   --   --   --   --   --   HGB 14.0   < > 13.5 12.3* 10.9* 11.6* 12.0* 12.0*  HCT 42.3   < > 40.0 36.7* 32.0* 35.4* 37.5* 35.4*  MCV 97.2  --  95.5 96.3  --  98.3 100.5* 94.7  PLT 159  --  131* 135*  --  124* 128* 142*   < > = values in this interval not displayed.   Basic Metabolic Panel: Recent Labs  Lab 03/16/19 0705  03/16/19 1134 03/17/19 0311 03/17/19 0509 03/18/19 0059 03/19/19 0333 03/19/19 2317 03/20/19 0317  NA 140   < >  --  141 141 147* 147*  --  140  K 3.6   < >  --  3.2* 3.1* 3.3* 3.7  --   3.1*  CL 104  --   --  105  --  111 105  --  97*  CO2 24  --   --  24  --  26 31  --  32  GLUCOSE 232*  --   --  133*  --  118* 110*  --  153*  BUN 9  --   --  10  --  13 12  --  15  CREATININE 0.81  --  0.71 0.75  --  0.76 0.63  --  0.66  CALCIUM 10.0  --   --  9.6  --  9.3 9.6  --  9.9  MG  --   --   --  2.0  --  1.9  --  2.0  --   PHOS  --   --   --  3.0  --   --   --   --   --    < > = values in this interval not displayed.   GFR: Estimated Creatinine Clearance: 115.7 mL/min (by C-G formula based on SCr of 0.66 mg/dL). Liver Function Tests: Recent Labs  Lab 03/16/19 0705 03/18/19 0059 03/19/19 0333 03/20/19 0317  AST 38 17 17 17   ALT 37 22 19 19   ALKPHOS 116 58 55 58  BILITOT 0.5 0.7 0.9 0.9  PROT 7.1 6.3* 6.6 6.7  ALBUMIN 4.2 3.5 3.6 3.5   No results for input(s): LIPASE, AMYLASE in the last 168 hours. No results for input(s): AMMONIA in the last 168 hours. Coagulation Profile: Recent Labs  Lab 03/16/19 0924  INR 1.0   Cardiac Enzymes: No results for input(s): CKTOTAL, CKMB, CKMBINDEX, TROPONINI in the last 168 hours. BNP (last 3 results) No results for input(s): PROBNP in the last 8760 hours. HbA1C: Recent Labs    03/18/19 0059  HGBA1C 5.3   CBG: Recent Labs  Lab 03/19/19 2331 03/20/19 0334 03/20/19 0737 03/20/19 1122 03/20/19 1531  GLUCAP 143* 150* 124* 156* 196*   Lipid Profile: Recent Labs    03/18/19 0059  TRIG 168*   Thyroid Function Tests: No results for input(s): TSH, T4TOTAL, FREET4, T3FREE, THYROIDAB in the last 72 hours. Anemia Panel: No results for input(s): VITAMINB12, FOLATE, FERRITIN, TIBC, IRON, RETICCTPCT in the last 72 hours. Sepsis Labs: Recent Labs  Lab 03/16/19 0705 03/19/19 1451  PROCALCITON  --  <0.10  LATICACIDVEN 1.7  --     Recent Results (from the past 240 hour(s))  SARS Coronavirus 2 by RT PCR (hospital order, performed in [facility] hospital lab) Nasopharyngeal Nasopharyngeal Swab     Status: None    Collection Time: 03/16/19  6:58 AM   Specimen: Nasopharyngeal Swab  Result Value Ref Range Status   SARS Coronavirus 2 NEGATIVE NEGATIVE Final    Comment: (NOTE) If result is NEGATIVE SARS-CoV-2 target  nucleic acids are NOT DETECTED. The SARS-CoV-2 RNA is generally detectable in upper and lower  respiratory specimens during the acute phase of infection. The lowest  concentration of SARS-CoV-2 viral copies this assay can detect is 250  copies / mL. A negative result does not preclude SARS-CoV-2 infection  and should not be used as the sole basis for treatment or other  patient management decisions.  A negative result may occur with  improper specimen collection / handling, submission of specimen other  than nasopharyngeal swab, presence of viral mutation(s) within the  areas targeted by this assay, and inadequate number of viral copies  (<250 copies / mL). A negative result must be combined with clinical  observations, patient history, and epidemiological information. If result is POSITIVE SARS-CoV-2 target nucleic acids are DETECTED. The SARS-CoV-2 RNA is generally detectable in upper and lower  respiratory specimens dur ing the acute phase of infection.  Positive  results are indicative of active infection with SARS-CoV-2.  Clinical  correlation with patient history and other diagnostic information is  necessary to determine patient infection status.  Positive results do  not rule out bacterial infection or co-infection with other viruses. If result is PRESUMPTIVE POSTIVE SARS-CoV-2 nucleic acids MAY BE PRESENT.   A presumptive positive result was obtained on the submitted specimen  and confirmed on repeat testing.  While 2019 novel coronavirus  (SARS-CoV-2) nucleic acids may be present in the submitted sample  additional confirmatory testing may be necessary for epidemiological  and / or clinical management purposes  to differentiate between  SARS-CoV-2 and other Sarbecovirus  currently known to infect humans.  If clinically indicated additional testing with an alternate test  methodology [phone]) is advised. The SARS-CoV-2 RNA is generally  detectable in upper and lower respiratory sp ecimens during the acute  phase of infection. The expected result is Negative. Fact Sheet for Patients:  StrictlyIdeas.no Fact Sheet for Healthcare Providers: BankingDealers.co.za This test is not yet approved or cleared by the [place] FDA and has been authorized for detection and/or diagnosis of SARS-CoV-2 by FDA under an Emergency Use Authorization (EUA).  This EUA will remain in effect (meaning this test can be used) for the duration of the COVID-19 declaration under Section 564(b)(1) of the Act, 21 U.S.C. section 360bbb-3(b)(1), unless the authorization is terminated or revoked sooner. Performed at [facility], [place] [address], [place], [place] [address]   Culture, blood (routine x 2)     Status: None (Preliminary result)   Collection Time: 03/16/19  7:05 AM   Specimen: BLOOD RIGHT HAND  Result Value Ref Range Status   Specimen Description BLOOD RIGHT HAND  Final   Special Requests   Final    BOTTLES DRAWN AEROBIC AND ANAEROBIC Blood Culture adequate volume   Culture   Final    NO GROWTH 4 DAYS Performed at [facility], [place] [address], [place], [place] [address]    Report Status PENDING  Incomplete  Culture, blood (routine x 2)     Status: None (Preliminary result)   Collection Time: 03/16/19  8:00 AM   Specimen: BLOOD LEFT HAND  Result Value Ref Range Status   Specimen Description BLOOD LEFT HAND  Final   Special Requests   Final    BOTTLES DRAWN AEROBIC AND ANAEROBIC Blood Culture adequate volume   Culture   Final    NO GROWTH 4 DAYS Performed at [facility], [place] [address], [place], [place] [address]  Report Status PENDING  Incomplete  Urine culture     Status: Abnormal   Collection  Time: 03/16/19 10:45 AM   Specimen: In/Out Cath Urine  Result Value Ref Range Status   Specimen Description IN/OUT CATH URINE  Final   Special Requests NONE  Final   Culture (A)  Final    10,000 COLONIES/mL STAPHYLOCOCCUS SPECIES (COAGULASE NEGATIVE) CALL MICROBIOLOGY LAB IF SENSITIVITIES ARE REQUIRED. Performed at [facility], [place] [address], [place], [place] [address]    Report Status 03/17/2019 FINAL  Final  MRSA PCR Screening     Status: Abnormal   Collection Time: 03/16/19  1:02 PM   Specimen: Nasal Mucosa; Nasopharyngeal  Result Value Ref Range Status   MRSA by PCR POSITIVE (A) NEGATIVE Final    Comment:        The GeneXpert MRSA Assay (FDA approved for NASAL specimens only), is one component of a comprehensive MRSA colonization surveillance program. It is not intended to diagnose MRSA infection nor to guide or monitor treatment for MRSA infections. CRITICAL RESULT CALLED TO, READ BACK BY AND VERIFIED WITH: Andreas Newport, RN AT 1510 ON 03/16/19 BY C. JESSUP, MT. Performed at [facility], [place] [address] [place], [place] [address]          Radiology Studies: No results found.      Scheduled Meds: . amLODipine  10 mg Oral Q breakfast  . aspirin  81 mg Per Tube Daily  . atorvastatin  40 mg Oral Q supper  . carvedilol  12.5 mg Oral BID WC  . chlordiazePOXIDE  25 mg Oral QID   Followed by  . chlordiazePOXIDE  25 mg Oral TID   Followed by  . [START ON 03/21/2019] chlordiazePOXIDE  25 mg Oral BH-qamhs   Followed by  . [START ON 03/22/2019] chlordiazePOXIDE  25 mg Oral Daily  . chlorhexidine  15 mL Mouth Rinse BID  . Chlorhexidine Gluconate Cloth  6 each Topical Daily  . cholecalciferol  1,000 Units Oral Q supper  . enoxaparin (LOVENOX) injection  40 mg Subcutaneous Q24H  . folic acid  1 mg Oral Daily  . furosemide  40 mg Oral BID  . gabapentin  300 mg Oral TID  . influenza vaccine adjuvanted  0.5 mL Intramuscular Tomorrow-1000  . insulin  aspart  3-9 Units Subcutaneous Q4H  . lisinopril  40 mg Oral Daily  . mouth rinse  15 mL Mouth Rinse q12n4p  . meloxicam  15 mg Oral QHS  . metolazone  2.5 mg Oral Once  . multivitamin with minerals  1 tablet Oral Q lunch  . mupirocin ointment  1 application Nasal BID  . pantoprazole  40 mg Oral QHS  . pneumococcal 23 valent vaccine  0.5 mL Intramuscular Tomorrow-1000  . potassium chloride  20 mEq Oral BID  . predniSONE  50 mg Oral Q breakfast  . sertraline  150 mg Oral Q breakfast  . thiamine  100 mg Oral Daily   Continuous Infusions: . sodium chloride 10 mL/hr at 03/18/19 2000     LOS: 4 days    Time spent: 25 minutes    Barb Merino, MD [facility] Pager [phone]

## 2019-03-20 NOTE — Progress Notes (Signed)
Patient ambulated without assistive equipment x2 laps around unit 2MW. Sats remained 93% and HR 80's. No distress noted. Assisted back to chair.

## 2019-03-20 NOTE — Progress Notes (Signed)
Pt. States he can place his cpap on when ready. RT filled pt. Water chamber. No issues at this time.

## 2019-03-21 DIAGNOSIS — J9601 Acute respiratory failure with hypoxia: Secondary | ICD-10-CM | POA: Diagnosis not present

## 2019-03-21 DIAGNOSIS — J9602 Acute respiratory failure with hypercapnia: Secondary | ICD-10-CM | POA: Diagnosis not present

## 2019-03-21 LAB — CULTURE, BLOOD (ROUTINE X 2)
Culture: NO GROWTH
Culture: NO GROWTH
Special Requests: ADEQUATE
Special Requests: ADEQUATE

## 2019-03-21 LAB — CBC
HCT: 34.6 % — ABNORMAL LOW (ref 39.0–52.0)
Hemoglobin: 11.6 g/dL — ABNORMAL LOW (ref 13.0–17.0)
MCH: 32 pg (ref 26.0–34.0)
MCHC: 33.5 g/dL (ref 30.0–36.0)
MCV: 95.3 fL (ref 80.0–100.0)
Platelets: 161 10*3/uL (ref 150–400)
RBC: 3.63 MIL/uL — ABNORMAL LOW (ref 4.22–5.81)
RDW: 13.5 % (ref 11.5–15.5)
WBC: 6.9 10*3/uL (ref 4.0–10.5)
nRBC: 0 % (ref 0.0–0.2)

## 2019-03-21 LAB — COMPREHENSIVE METABOLIC PANEL
ALT: 22 U/L (ref 0–44)
AST: 23 U/L (ref 15–41)
Albumin: 3.2 g/dL — ABNORMAL LOW (ref 3.5–5.0)
Alkaline Phosphatase: 56 U/L (ref 38–126)
Anion gap: 10 (ref 5–15)
BUN: 18 mg/dL (ref 8–23)
CO2: 31 mmol/L (ref 22–32)
Calcium: 10 mg/dL (ref 8.9–10.3)
Chloride: 98 mmol/L (ref 98–111)
Creatinine, Ser: 0.73 mg/dL (ref 0.61–1.24)
GFR calc Af Amer: >60 mL/min (ref >60)
GFR calc non Af Amer: >60 mL/min (ref >60)
Glucose, Bld: 144 mg/dL — ABNORMAL HIGH (ref 70–99)
Potassium: 3 mmol/L — ABNORMAL LOW (ref 3.5–5.1)
Sodium: 139 mmol/L (ref 135–145)
Total Bilirubin: 1 mg/dL (ref 0.3–1.2)
Total Protein: 6.2 g/dL — ABNORMAL LOW (ref 6.5–8.1)

## 2019-03-21 LAB — MAGNESIUM: Magnesium: 2 mg/dL (ref 1.7–2.4)

## 2019-03-21 LAB — GLUCOSE, CAPILLARY
Glucose-Capillary: 141 mg/dL — ABNORMAL HIGH (ref 70–99)
Glucose-Capillary: 112 mg/dL — ABNORMAL HIGH (ref 70–99)

## 2019-03-21 MED ORDER — POTASSIUM CHLORIDE CRYS ER 20 MEQ PO TBCR
40.0000 meq | EXTENDED_RELEASE_TABLET | ORAL | Status: AC
  Administered 2019-03-21 (×2): 40 meq via ORAL
  Filled 2019-03-21 (×2): qty 2

## 2019-03-21 NOTE — Care Management (Signed)
CM spoke with pt via phone.  Pt plans to discharge home back to wife. Wife is unable to pick pt up as she is experiencing a COPD exacerbation.  Pt unable to pay for cab home - pt will be given cab voucher.  Pt denied substance abuse resources - [facility] but had always declined - pt states "when Im ready I will be ready".  [facility] informed early in stay of admit and CM requested a PCP follow up for pt.   Pt will not discharge home on any new medications.  CM signing off

## 2019-03-21 NOTE — Progress Notes (Signed)
CSW spoke with transportation department to arrange a ride home. Patient signed rider waiver and will be submitted to medical records.  Madilyn Fireman, MSW, LCSW-A Transitions of Care  Clinical Social Worker  [facility] Emergency Departments  Medical ICU [phone]

## 2019-03-21 NOTE — Discharge Summary (Signed)
Physician Discharge Summary  Francis Cardenas [id number] DOB: July 24, 1950 DOA: 03/16/2019  PCP: Christain Sacramento, MD  Admit date: 03/16/2019 Discharge date: 03/21/2019  Admitted From: home  Disposition:  Home   Recommendations for Outpatient Follow-up:  1. Follow up with PCP in 1-2 weeks   Home Health:NA  Equipment/Devices:NA   Discharge Condition: Stable. CODE STATUS: Full code Diet recommendation: Low-carb diet  Discharge summary: 68 year old gentleman with history of COPD who presented to the emergency room with shortness of breath.  Was emergently intubated in the emergency department due to worsening shortness of breath and not responding to CPAP.  He was admitted to ICU, ultimately extubated next day to nasal cannula oxygen and currently remains on room air.  Patient was treated for acute hypoxic and hypercapnic respiratory failure secondary to COPD exacerbation.  He was treated with bronchodilator therapy, oral steroids and inhalational steroids.  He also received 5 days of Rocephin and azithromycin.  Today, patient is symptom-free.  He is on room air and was able to walk in the hallway with no need for supplemental oxygen.  Patient does have albuterol inhaler and nebulizer at home, he also has Symbicort that he was not consistently using, we advised him to use it consistently.  Patient has finished antibiotic therapy while in the hospital, no indication to continue.  He received 5 days of steroids and now symptoms mostly improved, no indication to continue.  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  Substance abuse counseling done.  Offered resources, however patient does not want to follow-up.  Patient stated that he will quit when he thinks he can do it all he needs to do it.  He does have CPAP at home and uses at night for obstructive sleep apnea.  Patient is asymptomatic today.  He is on  room air and has walked in the hallway.  Patient is on multiple medications at home including amitriptyline, sertraline, Trileptal and gabapentin.  This is prescribed by his primary care at [facility].  He is going to follow-up with them.  We did not do any changes on his long-term medications.  Due to recurrent hospitalization related to COPD, a referral to outpatient pulmonology done.  Patient is high risk for readmission given ongoing use of alcohol and diagnosis of COPD and polypharmacy.   Discharge Diagnoses:  Active Problems:   Respiratory failure, acute ([place])   Community acquired pneumonia    Discharge Instructions  Discharge Instructions    Ambulatory referral to Pulmonology   Complete by: As directed    Patient prefers to see Dr Lynford Citizen as his wife does   Diet Carb Modified   Complete by: As directed    Increase activity slowly   Complete by: As directed      Allergies as of 03/21/2019   No Known Allergies     Medication List    TAKE these medications   albuterol 108 (90 Base) MCG/ACT inhaler Commonly known as: VENTOLIN HFA Inhale 2 puffs into the lungs every 6 (six) hours as needed for wheezing.   ALPRAZolam 1 MG tablet Commonly known as: XANAX Take 1 mg by mouth at bedtime.   amitriptyline 10 MG tablet Commonly known as: ELAVIL Take 10 mg by mouth at bedtime.   amLODipine 10 MG tablet Commonly known as: NORVASC Take 10 mg by mouth daily with breakfast.   aspirin EC 81 MG tablet Take  81 mg by mouth daily with breakfast.   atorvastatin 80 MG tablet Commonly known as: LIPITOR Take 40 mg by mouth daily with supper.   budesonide-formoterol 80-4.5 MCG/ACT inhaler Commonly known as: SYMBICORT Inhale 2 puffs into the lungs 2 (two) times daily.   Carboxymethylcellulose Sodium 0.25 % Soln Place 1 drop into both eyes 4 (four) times daily as needed (dry eyes/ irritation).   carvedilol 25 MG tablet Commonly known as: COREG Take 1 tablet (25 mg total) by mouth  2 (two) times daily with a meal. For hypertension.   cholecalciferol 1000 units tablet Commonly known as: VITAMIN D Take 1,000 Units by mouth daily with supper.   diclofenac sodium 1 % Gel Commonly known as: VOLTAREN Apply 1 application topically 2 (two) times daily as needed (pain).   Ensure Max Protein Liqd Take 330 mLs (11 oz total) by mouth 2 (two) times daily.   furosemide 40 MG tablet Commonly known as: LASIX Take 1 tablet (40 mg total) by mouth 2 (two) times daily. What changed: when to take this   gabapentin 300 MG capsule Commonly known as: NEURONTIN Take 300 mg by mouth 3 (three) times daily.   lisinopril 40 MG tablet Commonly known as: ZESTRIL Take 1 tablet (40 mg total) by mouth daily. For hypertension. What changed: when to take this   magnesium oxide 400 MG tablet Commonly known as: MAG-OX Take 1 tablet (400 mg total) by mouth daily. What changed: when to take this   meloxicam 15 MG tablet Commonly known as: MOBIC Take 15 mg by mouth at bedtime.   metFORMIN 500 MG tablet Commonly known as: GLUCOPHAGE Take 500 mg by mouth daily with breakfast.   multivitamin with minerals tablet Take 1 tablet by mouth daily with lunch.   omeprazole 40 MG capsule Commonly known as: PRILOSEC Take 40 mg by mouth daily with supper.   OXcarbazepine 150 MG tablet Commonly known as: TRILEPTAL Take 300 mg by mouth 3 (three) times daily.   potassium chloride SA 20 MEQ tablet Commonly known as: KLOR-CON Take 2 tablets (40 mEq total) by mouth daily. What changed: when to take this   [place] into the lungs at bedtime. CPAP   sertraline 100 MG tablet Commonly known as: ZOLOFT Take one pill by mouth each day for depression and anxiety. What changed:   how much to take  how to take this  when to take this  additional instructions   Spiriva HandiHaler 18 MCG inhalation capsule Generic drug: tiotropium Place 1 capsule (18 mcg total) into  inhaler and inhale daily.   vitamin B-12 1000 MCG tablet Commonly known as: CYANOCOBALAMIN Take 1,000 mcg by mouth daily with supper.       No Known Allergies  Consultations:  PCCM   Procedures/Studies: Dg Chest Port 1 View  Result Date: 03/18/2019 CLINICAL DATA:  Respiratory failure. EXAM: PORTABLE CHEST 1 VIEW COMPARISON:  03/17/2019 FINDINGS: Endotracheal tube has tip 3 cm above the carina. Enteric tube courses into the region of the stomach and off the film as tip is not visualized. Lungs are adequately inflated and demonstrate mild hazy prominence of the perihilar/infrahilar vasculature right greater than left with slight interval improvement. No effusion. Cardiomediastinal silhouette and remainder of the exam is unchanged. IMPRESSION: Findings suggesting mild vascular congestion with some interval improvement. Tubes and lines as described. Electronically Signed   By: Marin Olp M.D.   On: 03/18/2019 07:27   Dg Chest Port 1 View  Result Date: 03/17/2019  CLINICAL DATA:  Respiratory failure EXAM: PORTABLE CHEST 1 VIEW COMPARISON:  Chest radiograph dated 03/16/2019 FINDINGS: An endotracheal tube terminates 1.6 cm from the carina. An enteric tube enters the stomach and terminates below the field of view. The heart remains enlarged. Moderate airspace opacities in the bilateral mid and lower lungs have increased since prior exam. A left pleural effusion may contribute. There is no right pleural effusion. There is no pneumothorax. IMPRESSION: 1. Endotracheal tube terminates 1.6 cm from the carina. 2. Increased airspace opacities in the mid and lower lungs bilaterally. A left pleural effusion may contribute. 3. Unchanged cardiomegaly. Electronically Signed   By: Zerita Boers M.D.   On: 03/17/2019 14:24   Dg Chest Portable 1 View  Result Date: 03/16/2019 CLINICAL DATA:  Shortness of breath. Evaluate ET tube placement. EXAM: PORTABLE CHEST 1 VIEW COMPARISON:  03/16/2019 FINDINGS: There  has been interval placement of ET tube with tip above the carina. A nasogastric tube is identified with tip below the field of view. Mild cardiac enlargement and mild pulmonary edema. Worsening airspace opacity within the right midlung and right base. There is also a progressive opacity in the left midlung. IMPRESSION: 1. Worsening bilateral airspace opacities. 2. Mild pulmonary edema. 3. Status post intubation Electronically Signed   By: Kerby Moors M.D.   On: 03/16/2019 08:29   Dg Chest Portable 1 View  Result Date: 03/16/2019 CLINICAL DATA:  Short of breath. EXAM: PORTABLE CHEST 1 VIEW COMPARISON:  12/10/2018 FINDINGS: Cardiac enlargement. Mild diffuse pulmonary edema. Bilateral lower lobe airspace opacities are noted right greater than left. IMPRESSION: 1. Cardiac enlargement and pulmonary edema. 2. Bilateral lower lobe airspace opacities compatible with edema or pneumonia. Electronically Signed   By: Kerby Moors M.D.   On: 03/16/2019 07:29   Vas [place] Upper Extremity Venous Duplex  Result Date: 03/17/2019 UPPER VENOUS STUDY  Indications: Swelling Comparison Study: no prior Performing Technologist: Rennis Harding RN  Examination Guidelines: A complete evaluation includes B-mode imaging, spectral Doppler, color Doppler, and power Doppler as needed of all accessible portions of each vessel. Bilateral testing is considered an integral part of a complete examination. Limited examinations for reoccurring indications may be performed as noted.  Right Findings: +----------+------------+---------+-----------+----------+-------+ RIGHT     CompressiblePhasicitySpontaneousPropertiesSummary +----------+------------+---------+-----------+----------+-------+ IJV           Full       Yes       Yes                      +----------+------------+---------+-----------+----------+-------+ Subclavian    Full       Yes       Yes                       +----------+------------+---------+-----------+----------+-------+ Axillary      Full       Yes       Yes                      +----------+------------+---------+-----------+----------+-------+ Brachial      Full       Yes       Yes                      +----------+------------+---------+-----------+----------+-------+ Radial        Full                                          +----------+------------+---------+-----------+----------+-------+  Ulnar         Full                                          +----------+------------+---------+-----------+----------+-------+ Cephalic      Full                                          +----------+------------+---------+-----------+----------+-------+ Basilic       Full                                          +----------+------------+---------+-----------+----------+-------+  Left Findings: +----------+------------+---------+-----------+----------+-------+ LEFT      CompressiblePhasicitySpontaneousPropertiesSummary +----------+------------+---------+-----------+----------+-------+ Subclavian               Yes       Yes                      +----------+------------+---------+-----------+----------+-------+  Summary:  Right: No evidence of deep vein thrombosis in the upper extremity. No evidence of superficial vein thrombosis in the upper extremity.  Left: No evidence of thrombosis in the subclavian.  *See table(s) above for measurements and observations.  Diagnosing physician: Monica Martinez MD Electronically signed by Monica Martinez MD on 03/17/2019 at 1:44:06 PM.    Final      Subjective: Patient was seen and examined.  No overnight events.  He is on room air.  Afebrile.  Denies any cough wheezing or sputum production.   Discharge Exam: Vitals:   03/21/19 0752 03/21/19 0800  BP: (!) 143/66 140/62  Pulse:  63  Resp:  (!) 21  Temp:    SpO2:  92%   Vitals:   03/21/19 0600 03/21/19 0700 03/21/19  0752 03/21/19 0800  BP: (!) 122/52  (!) 143/66 140/62  Pulse: (!) 48 65  63  Resp: 17 18  (!) 21  Temp:  97.9 F (36.6 C)    TempSrc:  Oral    SpO2: 92% 94%  92%  Weight:      Height:        General: Pt is alert, awake, not in acute distress Cardiovascular: RRR, S1/S2 +, no rubs, no gallops Respiratory: CTA bilaterally, no wheezing, no rhonchi, no added sounds. Abdominal: Soft, NT, ND, bowel sounds +, obese and pendulous. Extremities: no edema, no cyanosis    The results of significant diagnostics from this hospitalization (including imaging, microbiology, ancillary and laboratory) are listed below for reference.     Microbiology: Recent Results (from the past 240 hour(s))  SARS Coronavirus 2 by RT PCR (hospital order, performed in [facility] hospital lab) Nasopharyngeal Nasopharyngeal Swab     Status: None   Collection Time: 03/16/19  6:58 AM   Specimen: Nasopharyngeal Swab  Result Value Ref Range Status   SARS Coronavirus 2 NEGATIVE NEGATIVE Final    Comment: (NOTE) If result is NEGATIVE SARS-CoV-2 target nucleic acids are NOT DETECTED. The SARS-CoV-2 RNA is generally detectable in upper and lower  respiratory specimens during the acute phase of infection. The lowest  concentration of SARS-CoV-2 viral copies this assay can detect is 250  copies / mL. A negative result does not preclude SARS-CoV-2 infection  and should not be used  as the sole basis for treatment or other  patient management decisions.  A negative result may occur with  improper specimen collection / handling, submission of specimen other  than nasopharyngeal swab, presence of viral mutation(s) within the  areas targeted by this assay, and inadequate number of viral copies  (<250 copies / mL). A negative result must be combined with clinical  observations, patient history, and epidemiological information. If result is POSITIVE SARS-CoV-2 target nucleic acids are DETECTED. The SARS-CoV-2 RNA is generally  detectable in upper and lower  respiratory specimens dur ing the acute phase of infection.  Positive  results are indicative of active infection with SARS-CoV-2.  Clinical  correlation with patient history and other diagnostic information is  necessary to determine patient infection status.  Positive results do  not rule out bacterial infection or co-infection with other viruses. If result is PRESUMPTIVE POSTIVE SARS-CoV-2 nucleic acids MAY BE PRESENT.   A presumptive positive result was obtained on the submitted specimen  and confirmed on repeat testing.  While 2019 novel coronavirus  (SARS-CoV-2) nucleic acids may be present in the submitted sample  additional confirmatory testing may be necessary for epidemiological  and / or clinical management purposes  to differentiate between  SARS-CoV-2 and other Sarbecovirus currently known to infect humans.  If clinically indicated additional testing with an alternate test  methodology [phone]) is advised. The SARS-CoV-2 RNA is generally  detectable in upper and lower respiratory sp ecimens during the acute  phase of infection. The expected result is Negative. Fact Sheet for Patients:  StrictlyIdeas.no Fact Sheet for Healthcare Providers: BankingDealers.co.za This test is not yet approved or cleared by the [place] FDA and has been authorized for detection and/or diagnosis of SARS-CoV-2 by FDA under an Emergency Use Authorization (EUA).  This EUA will remain in effect (meaning this test can be used) for the duration of the COVID-19 declaration under Section 564(b)(1) of the Act, 21 U.S.C. section 360bbb-3(b)(1), unless the authorization is terminated or revoked sooner. Performed at [facility], [place] [address] [place], [place] [address]   Culture, blood (routine x 2)     Status: None   Collection Time: 03/16/19  7:05 AM   Specimen: BLOOD RIGHT HAND  Result Value Ref Range Status    Specimen Description BLOOD RIGHT HAND  Final   Special Requests   Final    BOTTLES DRAWN AEROBIC AND ANAEROBIC Blood Culture adequate volume   Culture   Final    NO GROWTH 5 DAYS Performed at [facility], [place] [address] [place], [place] [address]    Report Status 03/21/2019 FINAL  Final  Culture, blood (routine x 2)     Status: None   Collection Time: 03/16/19  8:00 AM   Specimen: BLOOD LEFT HAND  Result Value Ref Range Status   Specimen Description BLOOD LEFT HAND  Final   Special Requests   Final    BOTTLES DRAWN AEROBIC AND ANAEROBIC Blood Culture adequate volume   Culture   Final    NO GROWTH 5 DAYS Performed at [facility], [place] [address], [place], [place] [address]    Report Status 03/21/2019 FINAL  Final  Urine culture     Status: Abnormal   Collection Time: 03/16/19 10:45 AM   Specimen: In/Out Cath Urine  Result Value Ref Range Status   Specimen Description IN/OUT CATH URINE  Final   Special Requests NONE  Final   Culture (A)  Final  10,000 COLONIES/mL STAPHYLOCOCCUS SPECIES (COAGULASE NEGATIVE) CALL MICROBIOLOGY LAB IF SENSITIVITIES ARE REQUIRED. Performed at [facility], [place] [address] [place], [place] [address]    Report Status 03/17/2019 FINAL  Final  MRSA PCR Screening     Status: Abnormal   Collection Time: 03/16/19  1:02 PM   Specimen: Nasal Mucosa; Nasopharyngeal  Result Value Ref Range Status   MRSA by PCR POSITIVE (A) NEGATIVE Final    Comment:        The GeneXpert MRSA Assay (FDA approved for NASAL specimens only), is one component of a comprehensive MRSA colonization surveillance program. It is not intended to diagnose MRSA infection nor to guide or monitor treatment for MRSA infections. CRITICAL RESULT CALLED TO, READ BACK BY AND VERIFIED WITH: Andreas Newport, RN AT 1510 ON 03/16/19 BY C. JESSUP, MT. Performed at [facility], [place] [address], [place], [place] [address]      Labs: BNP (last 3 results) Recent  Labs    12/10/18 1702 03/16/19 0705 03/19/19 1451  BNP 144.9* 403.3* 123XX123*   Basic Metabolic Panel: Recent Labs  Lab 03/17/19 0311 03/17/19 0509 03/18/19 0059 03/19/19 0333 03/19/19 2317 03/20/19 0317 03/21/19 0414  NA 141 141 147* 147*  --  140 139  K 3.2* 3.1* 3.3* 3.7  --  3.1* 3.0*  CL 105  --  111 105  --  97* 98  CO2 24  --  26 31  --  32 31  GLUCOSE 133*  --  118* 110*  --  153* 144*  BUN 10  --  13 12  --  15 18  CREATININE 0.75  --  0.76 0.63  --  0.66 0.73  CALCIUM 9.6  --  9.3 9.6  --  9.9 10.0  MG 2.0  --  1.9  --  2.0  --  2.0  PHOS 3.0  --   --   --   --   --   --    Liver Function Tests: Recent Labs  Lab 03/16/19 0705 03/18/19 0059 03/19/19 0333 03/20/19 0317 03/21/19 0414  AST 38 17 17 17 23   ALT 37 22 19 19 22   ALKPHOS 116 58 55 58 56  BILITOT 0.5 0.7 0.9 0.9 1.0  PROT 7.1 6.3* 6.6 6.7 6.2*  ALBUMIN 4.2 3.5 3.6 3.5 3.2*   No results for input(s): LIPASE, AMYLASE in the last 168 hours. No results for input(s): AMMONIA in the last 168 hours. CBC: Recent Labs  Lab 03/16/19 0705  03/17/19 0311 03/17/19 0509 03/18/19 0059 03/19/19 0333 03/20/19 0317 03/21/19 0414  WBC 11.3*   < > 7.4  --  5.3 7.4 6.9 6.9  NEUTROABS 9.6*  --   --   --   --   --   --   --   HGB 14.0   < > 12.3* 10.9* 11.6* 12.0* 12.0* 11.6*  HCT 42.3   < > 36.7* 32.0* 35.4* 37.5* 35.4* 34.6*  MCV 97.2   < > 96.3  --  98.3 100.5* 94.7 95.3  PLT 159   < > 135*  --  124* 128* 142* 161   < > = values in this interval not displayed.   Cardiac Enzymes: No results for input(s): CKTOTAL, CKMB, CKMBINDEX, TROPONINI in the last 168 hours. BNP: Invalid input(s): POCBNP CBG: Recent Labs  Lab 03/20/19 1531 03/20/19 1948 03/20/19 2326 03/21/19 0300 03/21/19 0735  GLUCAP 196* 194* 143* 141* 112*   D-Dimer No results  for input(s): DDIMER in the last 72 hours. Hgb A1c No results for input(s): HGBA1C in the last 72 hours. Lipid Profile No results for input(s): CHOL, HDL,  LDLCALC, TRIG, CHOLHDL, LDLDIRECT in the last 72 hours. Thyroid function studies No results for input(s): TSH, T4TOTAL, T3FREE, THYROIDAB in the last 72 hours.  Invalid input(s): FREET3 Anemia work up No results for input(s): VITAMINB12, FOLATE, FERRITIN, TIBC, IRON, RETICCTPCT in the last 72 hours. Urinalysis    Component Value Date/Time   COLORURINE STRAW (A) 03/16/2019 0810   APPEARANCEUR CLEAR 03/16/2019 0810   LABSPEC 1.005 03/16/2019 0810   PHURINE 6.0 03/16/2019 0810   GLUCOSEU 50 (A) 03/16/2019 0810   HGBUR NEGATIVE 03/16/2019 0810   BILIRUBINUR NEGATIVE 03/16/2019 0810   KETONESUR NEGATIVE 03/16/2019 0810   PROTEINUR 100 (A) 03/16/2019 0810   UROBILINOGEN 0.2 03/24/2013 1255   NITRITE NEGATIVE 03/16/2019 0810   LEUKOCYTESUR NEGATIVE 03/16/2019 0810   Sepsis Labs Invalid input(s): PROCALCITONIN,  WBC,  LACTICIDVEN Microbiology Recent Results (from the past 240 hour(s))  SARS Coronavirus 2 by RT PCR (hospital order, performed in [facility] hospital lab) Nasopharyngeal Nasopharyngeal Swab     Status: None   Collection Time: 03/16/19  6:58 AM   Specimen: Nasopharyngeal Swab  Result Value Ref Range Status   SARS Coronavirus 2 NEGATIVE NEGATIVE Final    Comment: (NOTE) If result is NEGATIVE SARS-CoV-2 target nucleic acids are NOT DETECTED. The SARS-CoV-2 RNA is generally detectable in upper and lower  respiratory specimens during the acute phase of infection. The lowest  concentration of SARS-CoV-2 viral copies this assay can detect is 250  copies / mL. A negative result does not preclude SARS-CoV-2 infection  and should not be used as the sole basis for treatment or other  patient management decisions.  A negative result may occur with  improper specimen collection / handling, submission of specimen other  than nasopharyngeal swab, presence of viral mutation(s) within the  areas targeted by this assay, and inadequate number of viral copies  (<250 copies / mL). A  negative result must be combined with clinical  observations, patient history, and epidemiological information. If result is POSITIVE SARS-CoV-2 target nucleic acids are DETECTED. The SARS-CoV-2 RNA is generally detectable in upper and lower  respiratory specimens dur ing the acute phase of infection.  Positive  results are indicative of active infection with SARS-CoV-2.  Clinical  correlation with patient history and other diagnostic information is  necessary to determine patient infection status.  Positive results do  not rule out bacterial infection or co-infection with other viruses. If result is PRESUMPTIVE POSTIVE SARS-CoV-2 nucleic acids MAY BE PRESENT.   A presumptive positive result was obtained on the submitted specimen  and confirmed on repeat testing.  While 2019 novel coronavirus  (SARS-CoV-2) nucleic acids may be present in the submitted sample  additional confirmatory testing may be necessary for epidemiological  and / or clinical management purposes  to differentiate between  SARS-CoV-2 and other Sarbecovirus currently known to infect humans.  If clinically indicated additional testing with an alternate test  methodology [phone]) is advised. The SARS-CoV-2 RNA is generally  detectable in upper and lower respiratory sp ecimens during the acute  phase of infection. The expected result is Negative. Fact Sheet for Patients:  StrictlyIdeas.no Fact Sheet for Healthcare Providers: BankingDealers.co.za This test is not yet approved or cleared by the [place] FDA and has been authorized for detection and/or diagnosis of SARS-CoV-2 by FDA under an Emergency Use Authorization (  EUA).  This EUA will remain in effect (meaning this test can be used) for the duration of the COVID-19 declaration under Section 564(b)(1) of the Act, 21 U.S.C. section 360bbb-3(b)(1), unless the authorization is terminated or revoked sooner. Performed  at [facility], [place] [address] [place], [place] [address]   Culture, blood (routine x 2)     Status: None   Collection Time: 03/16/19  7:05 AM   Specimen: BLOOD RIGHT HAND  Result Value Ref Range Status   Specimen Description BLOOD RIGHT HAND  Final   Special Requests   Final    BOTTLES DRAWN AEROBIC AND ANAEROBIC Blood Culture adequate volume   Culture   Final    NO GROWTH 5 DAYS Performed at [facility], [place] [address], [place], [place] [address]    Report Status 03/21/2019 FINAL  Final  Culture, blood (routine x 2)     Status: None   Collection Time: 03/16/19  8:00 AM   Specimen: BLOOD LEFT HAND  Result Value Ref Range Status   Specimen Description BLOOD LEFT HAND  Final   Special Requests   Final    BOTTLES DRAWN AEROBIC AND ANAEROBIC Blood Culture adequate volume   Culture   Final    NO GROWTH 5 DAYS Performed at [facility], [place] [address] [place], [place] [address]    Report Status 03/21/2019 FINAL  Final  Urine culture     Status: Abnormal   Collection Time: 03/16/19 10:45 AM   Specimen: In/Out Cath Urine  Result Value Ref Range Status   Specimen Description IN/OUT CATH URINE  Final   Special Requests NONE  Final   Culture (A)  Final    10,000 COLONIES/mL STAPHYLOCOCCUS SPECIES (COAGULASE NEGATIVE) CALL MICROBIOLOGY LAB IF SENSITIVITIES ARE REQUIRED. Performed at [facility], [place] [address] [place], [place] [address]    Report Status 03/17/2019 FINAL  Final  MRSA PCR Screening     Status: Abnormal   Collection Time: 03/16/19  1:02 PM   Specimen: Nasal Mucosa; Nasopharyngeal  Result Value Ref Range Status   MRSA by PCR POSITIVE (A) NEGATIVE Final    Comment:        The GeneXpert MRSA Assay (FDA approved for NASAL specimens only), is one component of a comprehensive MRSA colonization surveillance program. It is not intended to diagnose MRSA infection nor to guide or monitor treatment for MRSA infections. CRITICAL RESULT CALLED  TO, READ BACK BY AND VERIFIED WITH: Andreas Newport, RN AT 1510 ON 03/16/19 BY C. JESSUP, MT. Performed at [facility], [place] [address], [place], [place] [address]      Time coordinating discharge: 35 minutes  SIGNED:   Barb Merino, MD  [facility] 03/21/2019, 9:53 AM

## 2019-03-21 NOTE — Progress Notes (Signed)
Patient [place] home via [place].  DC instructions given and fully understood by patient.  Vital signs and assessment stable prior to discharge.

## 2019-04-06 ENCOUNTER — Inpatient Hospital Stay (HOSPITAL_COMMUNITY)
Admission: EM | Admit: 2019-04-06 | Discharge: 2019-04-10 | DRG: 286 | Disposition: A | Discharge status: Home Health | Payer: No Typology Code available for payment source | Attending: Internal Medicine | Admitting: Internal Medicine

## 2019-04-06 ENCOUNTER — Other Ambulatory Visit: Payer: Self-pay

## 2019-04-06 ENCOUNTER — Emergency Department (HOSPITAL_COMMUNITY): Payer: No Typology Code available for payment source

## 2019-04-06 ENCOUNTER — Encounter (HOSPITAL_COMMUNITY): Payer: Self-pay | Admitting: Emergency Medicine

## 2019-04-06 DIAGNOSIS — I5043 Acute on chronic combined systolic (congestive) and diastolic (congestive) heart failure: Secondary | ICD-10-CM | POA: Diagnosis present

## 2019-04-06 DIAGNOSIS — J4489 Other specified chronic obstructive pulmonary disease: Secondary | ICD-10-CM | POA: Diagnosis present

## 2019-04-06 DIAGNOSIS — E119 Type 2 diabetes mellitus without complications: Secondary | ICD-10-CM | POA: Diagnosis present

## 2019-04-06 DIAGNOSIS — I272 Pulmonary hypertension, unspecified: Secondary | ICD-10-CM | POA: Diagnosis present

## 2019-04-06 DIAGNOSIS — E669 Obesity, unspecified: Secondary | ICD-10-CM | POA: Diagnosis not present

## 2019-04-06 DIAGNOSIS — Z8249 Family history of ischemic heart disease and other diseases of the circulatory system: Secondary | ICD-10-CM

## 2019-04-06 DIAGNOSIS — Z20828 Contact with and (suspected) exposure to other viral communicable diseases: Secondary | ICD-10-CM | POA: Diagnosis present

## 2019-04-06 DIAGNOSIS — Z8546 Personal history of malignant neoplasm of prostate: Secondary | ICD-10-CM

## 2019-04-06 DIAGNOSIS — E876 Hypokalemia: Secondary | ICD-10-CM | POA: Diagnosis present

## 2019-04-06 DIAGNOSIS — I248 Other forms of acute ischemic heart disease: Secondary | ICD-10-CM | POA: Diagnosis present

## 2019-04-06 DIAGNOSIS — I509 Heart failure, unspecified: Secondary | ICD-10-CM

## 2019-04-06 DIAGNOSIS — Z79899 Other long term (current) drug therapy: Secondary | ICD-10-CM

## 2019-04-06 DIAGNOSIS — I160 Hypertensive urgency: Secondary | ICD-10-CM | POA: Diagnosis present

## 2019-04-06 DIAGNOSIS — J9601 Acute respiratory failure with hypoxia: Secondary | ICD-10-CM | POA: Diagnosis present

## 2019-04-06 DIAGNOSIS — I5023 Acute on chronic systolic (congestive) heart failure: Secondary | ICD-10-CM | POA: Diagnosis not present

## 2019-04-06 DIAGNOSIS — F1010 Alcohol abuse, uncomplicated: Secondary | ICD-10-CM | POA: Diagnosis present

## 2019-04-06 DIAGNOSIS — E1165 Type 2 diabetes mellitus with hyperglycemia: Secondary | ICD-10-CM | POA: Diagnosis present

## 2019-04-06 DIAGNOSIS — Z806 Family history of leukemia: Secondary | ICD-10-CM

## 2019-04-06 DIAGNOSIS — E785 Hyperlipidemia, unspecified: Secondary | ICD-10-CM | POA: Diagnosis present

## 2019-04-06 DIAGNOSIS — Z7984 Long term (current) use of oral hypoglycemic drugs: Secondary | ICD-10-CM

## 2019-04-06 DIAGNOSIS — F329 Major depressive disorder, single episode, unspecified: Secondary | ICD-10-CM | POA: Diagnosis present

## 2019-04-06 DIAGNOSIS — G4733 Obstructive sleep apnea (adult) (pediatric): Secondary | ICD-10-CM | POA: Diagnosis present

## 2019-04-06 DIAGNOSIS — I110 Hypertensive heart disease with heart failure: Principal | ICD-10-CM | POA: Diagnosis present

## 2019-04-06 DIAGNOSIS — Z8701 Personal history of pneumonia (recurrent): Secondary | ICD-10-CM | POA: Diagnosis not present

## 2019-04-06 DIAGNOSIS — Z6841 Body Mass Index (BMI) 40.0 and over, adult: Secondary | ICD-10-CM

## 2019-04-06 DIAGNOSIS — Z7982 Long term (current) use of aspirin: Secondary | ICD-10-CM

## 2019-04-06 DIAGNOSIS — E1169 Type 2 diabetes mellitus with other specified complication: Secondary | ICD-10-CM

## 2019-04-06 DIAGNOSIS — E1142 Type 2 diabetes mellitus with diabetic polyneuropathy: Secondary | ICD-10-CM | POA: Diagnosis present

## 2019-04-06 DIAGNOSIS — I249 Acute ischemic heart disease, unspecified: Secondary | ICD-10-CM | POA: Diagnosis not present

## 2019-04-06 DIAGNOSIS — J449 Chronic obstructive pulmonary disease, unspecified: Secondary | ICD-10-CM | POA: Diagnosis present

## 2019-04-06 DIAGNOSIS — E7800 Pure hypercholesterolemia, unspecified: Secondary | ICD-10-CM | POA: Diagnosis present

## 2019-04-06 DIAGNOSIS — J9600 Acute respiratory failure, unspecified whether with hypoxia or hypercapnia: Secondary | ICD-10-CM | POA: Diagnosis not present

## 2019-04-06 DIAGNOSIS — J431 Panlobular emphysema: Secondary | ICD-10-CM

## 2019-04-06 DIAGNOSIS — I10 Essential (primary) hypertension: Secondary | ICD-10-CM | POA: Diagnosis not present

## 2019-04-06 DIAGNOSIS — Z7951 Long term (current) use of inhaled steroids: Secondary | ICD-10-CM

## 2019-04-06 DIAGNOSIS — R778 Other specified abnormalities of plasma proteins: Secondary | ICD-10-CM | POA: Diagnosis not present

## 2019-04-06 DIAGNOSIS — Z87891 Personal history of nicotine dependence: Secondary | ICD-10-CM | POA: Diagnosis not present

## 2019-04-06 DIAGNOSIS — K219 Gastro-esophageal reflux disease without esophagitis: Secondary | ICD-10-CM | POA: Diagnosis present

## 2019-04-06 DIAGNOSIS — R7989 Other specified abnormal findings of blood chemistry: Secondary | ICD-10-CM

## 2019-04-06 DIAGNOSIS — I5041 Acute combined systolic (congestive) and diastolic (congestive) heart failure: Secondary | ICD-10-CM | POA: Diagnosis not present

## 2019-04-06 HISTORY — DX: Other specified abnormal findings of blood chemistry: R79.89

## 2019-04-06 LAB — CBC WITH DIFFERENTIAL/PLATELET
Abs Immature Granulocytes: 0.07 10*3/uL (ref 0.00–0.07)
Basophils Absolute: 0 10*3/uL (ref 0.0–0.1)
Basophils Relative: 0 %
Eosinophils Absolute: 0.1 10*3/uL (ref 0.0–0.5)
Eosinophils Relative: 1 %
HCT: 46.2 % (ref 39.0–52.0)
Hemoglobin: 14.9 g/dL (ref 13.0–17.0)
Immature Granulocytes: 1 %
Lymphocytes Relative: 7 %
Lymphs Abs: 0.7 10*3/uL (ref 0.7–4.0)
MCH: 32.1 pg (ref 26.0–34.0)
MCHC: 32.3 g/dL (ref 30.0–36.0)
MCV: 99.6 fL (ref 80.0–100.0)
Monocytes Absolute: 0.2 10*3/uL (ref 0.1–1.0)
Monocytes Relative: 2 %
Neutro Abs: 9.6 10*3/uL — ABNORMAL HIGH (ref 1.7–7.7)
Neutrophils Relative %: 89 %
Platelets: 200 10*3/uL (ref 150–400)
RBC: 4.64 MIL/uL (ref 4.22–5.81)
RDW: 15 % (ref 11.5–15.5)
WBC: 10.7 10*3/uL — ABNORMAL HIGH (ref 4.0–10.5)
nRBC: 0.2 % (ref 0.0–0.2)

## 2019-04-06 LAB — BASIC METABOLIC PANEL
Anion gap: 14 (ref 5–15)
BUN: 8 mg/dL (ref 8–23)
CO2: 23 mmol/L (ref 22–32)
Calcium: 9.8 mg/dL (ref 8.9–10.3)
Chloride: 105 mmol/L (ref 98–111)
Creatinine, Ser: 0.69 mg/dL (ref 0.61–1.24)
GFR calc Af Amer: >60 mL/min (ref >60)
GFR calc non Af Amer: >60 mL/min (ref >60)
Glucose, Bld: 207 mg/dL — ABNORMAL HIGH (ref 70–99)
Potassium: 3.7 mmol/L (ref 3.5–5.1)
Sodium: 142 mmol/L (ref 135–145)

## 2019-04-06 LAB — MRSA PCR SCREENING: MRSA by PCR: POSITIVE — AB

## 2019-04-06 LAB — SARS CORONAVIRUS 2 BY RT PCR (HOSPITAL ORDER, PERFORMED IN ~~LOC~~ HOSPITAL LAB): SARS Coronavirus 2: NEGATIVE

## 2019-04-06 LAB — TROPONIN I (HIGH SENSITIVITY)
Troponin I (High Sensitivity): 44 ng/L — ABNORMAL HIGH (ref <18)
Troponin I (High Sensitivity): 136 ng/L (ref <18)
Troponin I (High Sensitivity): 183 ng/L (ref <18)

## 2019-04-06 LAB — GLUCOSE, CAPILLARY
Glucose-Capillary: 149 mg/dL — ABNORMAL HIGH (ref 70–99)
Glucose-Capillary: 147 mg/dL — ABNORMAL HIGH (ref 70–99)

## 2019-04-06 LAB — BRAIN NATRIURETIC PEPTIDE: B Natriuretic Peptide: 581 pg/mL — ABNORMAL HIGH (ref 0.0–100.0)

## 2019-04-06 MED ORDER — ATORVASTATIN CALCIUM 40 MG PO TABS
40.0000 mg | ORAL_TABLET | Freq: Every day | ORAL | Status: DC
  Administered 2019-04-06 – 2019-04-09 (×4): 40 mg via ORAL
  Filled 2019-04-06 (×4): qty 1

## 2019-04-06 MED ORDER — ARFORMOTEROL TARTRATE 15 MCG/2ML IN NEBU
15.0000 ug | INHALATION_SOLUTION | Freq: Two times a day (BID) | RESPIRATORY_TRACT | Status: DC
  Administered 2019-04-06 – 2019-04-10 (×8): 15 ug via RESPIRATORY_TRACT
  Filled 2019-04-06 (×9): qty 2

## 2019-04-06 MED ORDER — POTASSIUM CHLORIDE CRYS ER 20 MEQ PO TBCR
40.0000 meq | EXTENDED_RELEASE_TABLET | Freq: Every day | ORAL | Status: DC
  Administered 2019-04-06: 40 meq via ORAL
  Filled 2019-04-06: qty 2

## 2019-04-06 MED ORDER — FOLIC ACID 1 MG PO TABS
1.0000 mg | ORAL_TABLET | Freq: Every day | ORAL | Status: DC
  Administered 2019-04-07 – 2019-04-10 (×4): 1 mg via ORAL
  Filled 2019-04-06 (×4): qty 1

## 2019-04-06 MED ORDER — CARVEDILOL 25 MG PO TABS
25.0000 mg | ORAL_TABLET | Freq: Two times a day (BID) | ORAL | Status: DC
  Administered 2019-04-06 – 2019-04-10 (×8): 25 mg via ORAL
  Filled 2019-04-06 (×8): qty 1

## 2019-04-06 MED ORDER — FUROSEMIDE 10 MG/ML IJ SOLN
40.0000 mg | Freq: Once | INTRAMUSCULAR | Status: AC
  Administered 2019-04-06: 40 mg via INTRAVENOUS
  Filled 2019-04-06: qty 4

## 2019-04-06 MED ORDER — LORAZEPAM 1 MG PO TABS: 1.0000 mg | ORAL_TABLET | ORAL | Status: AC | PRN

## 2019-04-06 MED ORDER — VITAMIN B-1 100 MG PO TABS
100.0000 mg | ORAL_TABLET | Freq: Every day | ORAL | Status: DC
  Administered 2019-04-07 – 2019-04-10 (×4): 100 mg via ORAL
  Filled 2019-04-06 (×5): qty 1

## 2019-04-06 MED ORDER — AMLODIPINE BESYLATE 10 MG PO TABS
10.0000 mg | ORAL_TABLET | Freq: Every day | ORAL | Status: DC
  Administered 2019-04-07 – 2019-04-10 (×4): 10 mg via ORAL
  Filled 2019-04-06 (×4): qty 1

## 2019-04-06 MED ORDER — TIOTROPIUM BROMIDE MONOHYDRATE 18 MCG IN CAPS: 18.0000 ug | ORAL_CAPSULE | Freq: Every day | RESPIRATORY_TRACT | Status: DC

## 2019-04-06 MED ORDER — SODIUM CHLORIDE 0.9 % IV SOLN: 250.0000 mL | INTRAVENOUS | Status: DC | PRN

## 2019-04-06 MED ORDER — ALBUTEROL SULFATE HFA 108 (90 BASE) MCG/ACT IN AERS
4.0000 | INHALATION_SPRAY | Freq: Once | RESPIRATORY_TRACT | Status: AC
  Administered 2019-04-06: 4 via RESPIRATORY_TRACT
  Filled 2019-04-06: qty 6.7

## 2019-04-06 MED ORDER — SERTRALINE HCL 50 MG PO TABS
150.0000 mg | ORAL_TABLET | Freq: Every day | ORAL | Status: DC
  Administered 2019-04-07 – 2019-04-10 (×4): 150 mg via ORAL
  Filled 2019-04-06 (×5): qty 1

## 2019-04-06 MED ORDER — DICLOFENAC SODIUM 1 % TD GEL
1.0000 "application " | Freq: Two times a day (BID) | TRANSDERMAL | Status: DC | PRN
  Filled 2019-04-06: qty 100

## 2019-04-06 MED ORDER — GABAPENTIN 300 MG PO CAPS
300.0000 mg | ORAL_CAPSULE | Freq: Three times a day (TID) | ORAL | Status: DC
  Administered 2019-04-06 – 2019-04-10 (×12): 300 mg via ORAL
  Filled 2019-04-06 (×12): qty 1

## 2019-04-06 MED ORDER — CHLORHEXIDINE GLUCONATE CLOTH 2 % EX PADS
6.0000 | MEDICATED_PAD | Freq: Every day | CUTANEOUS | Status: DC
  Administered 2019-04-07 – 2019-04-09 (×3): 6 via TOPICAL

## 2019-04-06 MED ORDER — LORAZEPAM 2 MG/ML IJ SOLN: 0.0000 mg | Freq: Two times a day (BID) | INTRAMUSCULAR | Status: AC

## 2019-04-06 MED ORDER — ALBUTEROL SULFATE (2.5 MG/3ML) 0.083% IN NEBU: 2.5000 mg | INHALATION_SOLUTION | RESPIRATORY_TRACT | Status: DC | PRN

## 2019-04-06 MED ORDER — PANTOPRAZOLE SODIUM 40 MG PO TBEC
40.0000 mg | DELAYED_RELEASE_TABLET | Freq: Every day | ORAL | Status: DC
  Administered 2019-04-07 – 2019-04-10 (×4): 40 mg via ORAL
  Filled 2019-04-06 (×4): qty 1

## 2019-04-06 MED ORDER — NITROGLYCERIN 0.4 MG SL SUBL: 0.4000 mg | SUBLINGUAL_TABLET | Freq: Once | SUBLINGUAL | Status: DC

## 2019-04-06 MED ORDER — OXCARBAZEPINE 300 MG PO TABS: 300.0000 mg | ORAL_TABLET | Freq: Three times a day (TID) | ORAL | Status: DC

## 2019-04-06 MED ORDER — NITROGLYCERIN 0.4 MG SL SUBL
0.4000 mg | SUBLINGUAL_TABLET | Freq: Once | SUBLINGUAL | Status: AC
  Administered 2019-04-06: 0.4 mg via SUBLINGUAL
  Filled 2019-04-06: qty 1

## 2019-04-06 MED ORDER — THIAMINE HCL 100 MG/ML IJ SOLN: 100.0000 mg | Freq: Every day | INTRAMUSCULAR | Status: DC

## 2019-04-06 MED ORDER — INSULIN ASPART 100 UNIT/ML ~~LOC~~ SOLN
0.0000 [IU] | Freq: Three times a day (TID) | SUBCUTANEOUS | Status: DC
  Administered 2019-04-06 – 2019-04-07 (×3): 1 [IU] via SUBCUTANEOUS

## 2019-04-06 MED ORDER — ACETAMINOPHEN 325 MG PO TABS: 650.0000 mg | ORAL_TABLET | ORAL | Status: DC | PRN

## 2019-04-06 MED ORDER — IPRATROPIUM-ALBUTEROL 0.5-2.5 (3) MG/3ML IN SOLN
3.0000 mL | RESPIRATORY_TRACT | Status: DC | PRN
  Administered 2019-04-08: 3 mL via RESPIRATORY_TRACT
  Filled 2019-04-06: qty 3

## 2019-04-06 MED ORDER — ENOXAPARIN SODIUM 40 MG/0.4ML ~~LOC~~ SOLN
40.0000 mg | SUBCUTANEOUS | Status: DC
  Administered 2019-04-07 – 2019-04-08 (×2): 40 mg via SUBCUTANEOUS
  Filled 2019-04-06 (×2): qty 0.4

## 2019-04-06 MED ORDER — LORAZEPAM 2 MG/ML IJ SOLN: 1.0000 mg | INTRAMUSCULAR | Status: AC | PRN

## 2019-04-06 MED ORDER — IPRATROPIUM BROMIDE HFA 17 MCG/ACT IN AERS
2.0000 | INHALATION_SPRAY | Freq: Once | RESPIRATORY_TRACT | Status: DC
  Filled 2019-04-06: qty 12.9

## 2019-04-06 MED ORDER — LORAZEPAM 2 MG/ML IJ SOLN: 0.0000 mg | Freq: Four times a day (QID) | INTRAMUSCULAR | Status: AC

## 2019-04-06 MED ORDER — ADULT MULTIVITAMIN W/MINERALS CH
1.0000 | ORAL_TABLET | Freq: Every day | ORAL | Status: DC
  Administered 2019-04-07 – 2019-04-10 (×4): 1 via ORAL
  Filled 2019-04-06 (×4): qty 1

## 2019-04-06 MED ORDER — BUDESONIDE 0.5 MG/2ML IN SUSP
0.5000 mg | Freq: Two times a day (BID) | RESPIRATORY_TRACT | Status: DC
  Administered 2019-04-06 – 2019-04-10 (×8): 0.5 mg via RESPIRATORY_TRACT
  Filled 2019-04-06 (×9): qty 2

## 2019-04-06 MED ORDER — METHYLPREDNISOLONE SODIUM SUCC 125 MG IJ SOLR
125.0000 mg | Freq: Once | INTRAMUSCULAR | Status: AC
  Administered 2019-04-06: 125 mg via INTRAVENOUS
  Filled 2019-04-06: qty 2

## 2019-04-06 MED ORDER — MUPIROCIN 2 % EX OINT
1.0000 "application " | TOPICAL_OINTMENT | Freq: Two times a day (BID) | CUTANEOUS | Status: DC
  Administered 2019-04-06 – 2019-04-10 (×8): 1 via NASAL
  Filled 2019-04-06 (×2): qty 22

## 2019-04-06 MED ORDER — LISINOPRIL 40 MG PO TABS
40.0000 mg | ORAL_TABLET | Freq: Every day | ORAL | Status: DC
  Administered 2019-04-07 – 2019-04-10 (×4): 40 mg via ORAL
  Filled 2019-04-06 (×4): qty 1

## 2019-04-06 MED ORDER — FUROSEMIDE 10 MG/ML IJ SOLN
40.0000 mg | Freq: Two times a day (BID) | INTRAMUSCULAR | Status: DC
  Administered 2019-04-06 – 2019-04-10 (×7): 40 mg via INTRAVENOUS
  Filled 2019-04-06 (×7): qty 4

## 2019-04-06 MED ORDER — SODIUM CHLORIDE 0.9% FLUSH: 3.0000 mL | INTRAVENOUS | Status: DC | PRN

## 2019-04-06 MED ORDER — LABETALOL HCL 5 MG/ML IV SOLN
5.0000 mg | INTRAVENOUS | Status: DC | PRN
  Administered 2019-04-06: 5 mg via INTRAVENOUS
  Filled 2019-04-06: qty 4

## 2019-04-06 MED ORDER — AMITRIPTYLINE HCL 10 MG PO TABS: 10.0000 mg | ORAL_TABLET | Freq: Every day | ORAL | Status: DC

## 2019-04-06 MED ORDER — ONDANSETRON HCL 4 MG/2ML IJ SOLN: 4.0000 mg | Freq: Four times a day (QID) | INTRAMUSCULAR | Status: DC | PRN

## 2019-04-06 MED ORDER — ALPRAZOLAM 0.5 MG PO TABS
1.0000 mg | ORAL_TABLET | Freq: Every day | ORAL | Status: DC
  Administered 2019-04-06 – 2019-04-09 (×4): 1 mg via ORAL
  Filled 2019-04-06 (×5): qty 2

## 2019-04-06 MED ORDER — CARBOXYMETHYLCELLULOSE SODIUM 0.25 % OP SOLN: 1.0000 [drp] | Freq: Four times a day (QID) | OPHTHALMIC | Status: DC | PRN

## 2019-04-06 MED ORDER — POLYVINYL ALCOHOL 1.4 % OP SOLN
1.0000 [drp] | Freq: Four times a day (QID) | OPHTHALMIC | Status: DC | PRN
  Filled 2019-04-06: qty 15

## 2019-04-06 MED ORDER — SODIUM CHLORIDE 0.9% FLUSH
3.0000 mL | Freq: Two times a day (BID) | INTRAVENOUS | Status: DC
  Administered 2019-04-06 – 2019-04-10 (×8): 3 mL via INTRAVENOUS

## 2019-04-06 MED ORDER — MAGNESIUM OXIDE 400 (241.3 MG) MG PO TABS
400.0000 mg | ORAL_TABLET | Freq: Every day | ORAL | Status: DC
  Administered 2019-04-06 – 2019-04-09 (×4): 400 mg via ORAL
  Filled 2019-04-06 (×4): qty 1

## 2019-04-06 NOTE — ED Notes (Addendum)
ED TO INPATIENT HANDOFF REPORT  ED Nurse Name and Phone #: Patterson Hammersmith  S Name/Age/Gender Francis Cardenas 68 y.o. male Room/Bed: 030C/030C  Code Status   Code Status: Full Code  Home/SNF/Other Home Patient oriented to: self, place, time and situation Is this baseline? Yes   Triage Complete: Triage complete  Chief Complaint CHF CPAP  Triage Note Pt BIB [facility] from home with compliant of acute onset of shortness of breath this morning. Pt with history of CHF recently upped dosage of lasix but still not helping. Lung sounds diminished. Pt arrives on NRB. Pt initially on [place] 2 L baseline @ home. Pt put in CPAP by EMS but responded better to NRB.   Allergies No Known Allergies  Level of Care/Admitting Diagnosis ED Disposition    ED Disposition Condition [facility] Area: [place] [[id number]]  Level of Care: Progressive [102]  Admit to Progressive based on following criteria: RESPIRATORY PROBLEMS hypoxemic/hypercapnic respiratory failure that is responsive to NIPPV (BiPAP) or High Flow Nasal Cannula (6-80 lpm). Frequent assessment/intervention, no > Q2 hrs < Q4 hrs, to maintain oxygenation and pulmonary hygiene.  Covid Evaluation: Confirmed COVID Negative  Diagnosis: Acute exacerbation of CHF (congestive heart failure) [id number]  Admitting Physician: Norval Morton [id number]  Attending Physician: Norval Morton [id number]  Estimated length of stay: past midnight tomorrow  Certification:: I certify this patient will need inpatient services for at least 2 midnights  PT Class (Do Not Modify): Inpatient [101]  PT Acc Code (Do Not Modify): Private [1]       B Medical/Surgery History Past Medical History:  Diagnosis Date  . BPH (benign prostatic hyperplasia)   . Cancer [facility])    prostate  . COPD (chronic obstructive pulmonary disease) ([place])   . Diabetes mellitus without complication ([place])   . Hypercholesteremia   . Hypertension     Past Surgical History:  Procedure Laterality Date  . ACHILLES TENDON REPAIR Left   . KNEE ARTHROSCOPY Left   . TONSILLECTOMY    . WRIST FRACTURE SURGERY Left      A IV Location/Drains/Wounds Patient Lines/Drains/Airways Status   Active Line/Drains/Airways    Name:   Placement date:   Placement time:   Site:   Days:   Peripheral IV 04/06/19 Left Antecubital   04/06/19    1111    Antecubital   less than 1          Intake/Output Last 24 hours No intake or output data in the 24 hours ending 04/06/19 1516  Labs/Imaging Results for orders placed or performed during the hospital encounter of 04/06/19 (from the past 48 hour(s))  CBC with Differential     Status: Abnormal   Collection Time: 04/06/19  8:13 AM  Result Value Ref Range   WBC 10.7 (H) 4.0 - 10.5 K/uL   RBC 4.64 4.22 - 5.81 MIL/uL   Hemoglobin 14.9 13.0 - 17.0 g/dL   HCT 46.2 39.0 - 52.0 %   MCV 99.6 80.0 - 100.0 fL   MCH 32.1 26.0 - 34.0 pg   MCHC 32.3 30.0 - 36.0 g/dL   RDW 15.0 11.5 - 15.5 %   Platelets 200 150 - 400 K/uL   nRBC 0.2 0.0 - 0.2 %   Neutrophils Relative % 89 %   Neutro Abs 9.6 (H) 1.7 - 7.7 K/uL   Lymphocytes Relative 7 %   Lymphs Abs 0.7 0.7 - 4.0 K/uL   Monocytes Relative 2 %  Monocytes Absolute 0.2 0.1 - 1.0 K/uL   Eosinophils Relative 1 %   Eosinophils Absolute 0.1 0.0 - 0.5 K/uL   Basophils Relative 0 %   Basophils Absolute 0.0 0.0 - 0.1 K/uL   Immature Granulocytes 1 %   Abs Immature Granulocytes 0.07 0.00 - 0.07 K/uL    Comment: Performed at [facility], [place] [address], [place], [place] [id number]  Basic metabolic panel     Status: Abnormal   Collection Time: 04/06/19  8:13 AM  Result Value Ref Range   Sodium 142 135 - 145 mmol/L   Potassium 3.7 3.5 - 5.1 mmol/L   Chloride 105 98 - 111 mmol/L   CO2 23 22 - 32 mmol/L   Glucose, Bld 207 (H) 70 - 99 mg/dL   BUN 8 8 - 23 mg/dL   Creatinine, Ser 0.69 0.61 - 1.24 mg/dL   Calcium 9.8 8.9 - 10.3 mg/dL   GFR calc non Af Amer >60  >60 mL/min   GFR calc Af Amer >60 >60 mL/min   Anion gap 14 5 - 15    Comment: Performed at [facility] [address], [place], [place] [address]  Troponin I (High Sensitivity)     Status: Abnormal   Collection Time: 04/06/19  8:13 AM  Result Value Ref Range   Troponin I (High Sensitivity) 44 (H) <18 ng/L    Comment: (NOTE) Elevated high sensitivity troponin I (hsTnI) values and significant  changes across serial measurements may suggest ACS but many other  chronic and acute conditions are known to elevate hsTnI results.  Refer to the "Links" section for chest pain algorithms and additional  guidance. Performed at [facility], [place] [address], [place], [place] [address]   Brain natriuretic peptide     Status: Abnormal   Collection Time: 04/06/19  8:13 AM  Result Value Ref Range   B Natriuretic Peptide 581.0 (H) 0.0 - 100.0 pg/mL    Comment: Performed at [facility] [address], [place], [place] [address]  SARS Coronavirus 2 by RT PCR (hospital order, performed in [facility] hospital lab) Nasopharyngeal Nasopharyngeal Swab     Status: None   Collection Time: 04/06/19  8:51 AM   Specimen: Nasopharyngeal Swab  Result Value Ref Range   SARS Coronavirus 2 NEGATIVE NEGATIVE    Comment: (NOTE) If result is NEGATIVE SARS-CoV-2 target nucleic acids are NOT DETECTED. The SARS-CoV-2 RNA is generally detectable in upper and lower  respiratory specimens during the acute phase of infection. The lowest  concentration of SARS-CoV-2 viral copies this assay can detect is 250  copies / mL. A negative result does not preclude SARS-CoV-2 infection  and should not be used as the sole basis for treatment or other  patient management decisions.  A negative result may occur with  improper specimen collection / handling, submission of specimen other  than nasopharyngeal swab, presence of viral mutation(s) within the  areas targeted by this assay, and inadequate number of viral  copies  (<250 copies / mL). A negative result must be combined with clinical  observations, patient history, and epidemiological information. If result is POSITIVE SARS-CoV-2 target nucleic acids are DETECTED. The SARS-CoV-2 RNA is generally detectable in upper and lower  respiratory specimens dur ing the acute phase of infection.  Positive  results are indicative of active infection with SARS-CoV-2.  Clinical  correlation with patient history and other diagnostic information is  necessary to determine patient  infection status.  Positive results do  not rule out bacterial infection or co-infection with other viruses. If result is PRESUMPTIVE POSTIVE SARS-CoV-2 nucleic acids MAY BE PRESENT.   A presumptive positive result was obtained on the submitted specimen  and confirmed on repeat testing.  While 2019 novel coronavirus  (SARS-CoV-2) nucleic acids may be present in the submitted sample  additional confirmatory testing may be necessary for epidemiological  and / or clinical management purposes  to differentiate between  SARS-CoV-2 and other Sarbecovirus currently known to infect humans.  If clinically indicated additional testing with an alternate test  methodology [id number]) is advised. The SARS-CoV-2 RNA is generally  detectable in upper and lower respiratory sp ecimens during the acute  phase of infection. The expected result is Negative. Fact Sheet for Patients:  StrictlyIdeas.no Fact Sheet for Healthcare Providers: BankingDealers.co.za This test is not yet approved or cleared by the [place] FDA and has been authorized for detection and/or diagnosis of SARS-CoV-2 by FDA under an Emergency Use Authorization (EUA).  This EUA will remain in effect (meaning this test can be used) for the duration of the COVID-19 declaration under Section 564(b)(1) of the Act, 21 U.S.C. section 360bbb-3(b)(1), unless the authorization is terminated  or revoked sooner. Performed at [facility], [place] [address], [place], [place] [address]   Troponin I (High Sensitivity)     Status: Abnormal   Collection Time: 04/06/19 11:08 AM  Result Value Ref Range   Troponin I (High Sensitivity) 136 (HH) <18 ng/L    Comment: CRITICAL RESULT CALLED TO, READ BACK BY AND VERIFIED WITH: B.BECK RN @ 1207 04/06/2019 BY C.EDENS (NOTE) Elevated high sensitivity troponin I (hsTnI) values and significant  changes across serial measurements may suggest ACS but many other  chronic and acute conditions are known to elevate hsTnI results.  Refer to the Links section for chest pain algorithms and additional  guidance. Performed at [facility], [place] [address] [place], [place] [address]   Troponin I (High Sensitivity)     Status: Abnormal   Collection Time: 04/06/19  2:07 PM  Result Value Ref Range   Troponin I (High Sensitivity) 183 (HH) <18 ng/L    Comment: CRITICAL VALUE NOTED.  VALUE IS CONSISTENT WITH PREVIOUSLY REPORTED AND CALLED VALUE. (NOTE) Elevated high sensitivity troponin I (hsTnI) values and significant  changes across serial measurements may suggest ACS but many other  chronic and acute conditions are known to elevate hsTnI results.  Refer to the Links section for chest pain algorithms and additional  guidance. Performed at [facility], [place] [address] [place], [place] [address]    Dg Chest Portable 1 View  Result Date: 04/06/2019 CLINICAL DATA:  Heart failure. EXAM: PORTABLE CHEST 1 VIEW COMPARISON:  03/18/2019 FINDINGS: Normal heart size. Bilateral pleural effusions and pulmonary edema identified compatible with moderate CHF. Diminished aeration to bilateral mid and lower lung zones identified. Upper lobes are relatively clear. IMPRESSION: 1. Moderate CHF. 2. Diminished aeration to bilateral mid and lower lung zones. Electronically Signed   By: Kerby Moors M.D.   On: 04/06/2019 09:32    Pending Labs Unresulted  Labs (From admission, onward)    Start     Ordered   04/07/19 [id number]  Basic metabolic panel  Daily,   R     04/06/19 1227   04/07/19 0500  CBC WITH DIFFERENTIAL  Tomorrow morning,   R     04/06/19 1227  Vitals/Pain Today's Vitals   04/06/19 1245 04/06/19 1315 04/06/19 1330 04/06/19 1415  BP: (!) 195/99 (!) 168/84 (!) 185/89 (!) 176/84  Pulse: 90 82 78 81  Resp: 15 (!) 21 16   Temp:      SpO2: 94% 95% 96% 96%  Weight:      Height:      PainSc:        Isolation Precautions No active isolations  Medications Medications  ipratropium (ATROVENT HFA) inhaler 2 puff (2 puffs Inhalation Not Given 04/06/19 0900)  nitroGLYCERIN (NITROSTAT) SL tablet 0.4 mg (0.4 mg Sublingual Not Given 04/06/19 0936)  sodium chloride flush (NS) 0.9 % injection 3 mL (3 mLs Intravenous Given 04/06/19 1316)  sodium chloride flush (NS) 0.9 % injection 3 mL (has no administration in time range)  0.9 %  sodium chloride infusion (has no administration in time range)  acetaminophen (TYLENOL) tablet 650 mg (has no administration in time range)  ondansetron (ZOFRAN) injection 4 mg (has no administration in time range)  enoxaparin (LOVENOX) injection 40 mg (has no administration in time range)  furosemide (LASIX) injection 40 mg (has no administration in time range)  carvedilol (COREG) tablet 25 mg (has no administration in time range)  lisinopril (ZESTRIL) tablet 40 mg (has no administration in time range)  atorvastatin (LIPITOR) tablet 40 mg (has no administration in time range)  amLODipine (NORVASC) tablet 10 mg (has no administration in time range)  magnesium oxide (MAG-OX) tablet 400 mg (has no administration in time range)  pantoprazole (PROTONIX) EC tablet 40 mg (has no administration in time range)  gabapentin (NEURONTIN) capsule 300 mg (has no administration in time range)  potassium chloride SA (KLOR-CON) CR tablet 40 mEq (has no administration in time range)  budesonide (PULMICORT) nebulizer  solution 0.5 mg (0.5 mg Nebulization Not Given 04/06/19 1338)  arformoterol (BROVANA) nebulizer solution 15 mcg (15 mcg Nebulization Not Given 04/06/19 1338)  ALPRAZolam (XANAX) tablet 1 mg (has no administration in time range)  sertraline (ZOLOFT) tablet 150 mg (has no administration in time range)  diclofenac sodium (VOLTAREN) 1 % transdermal gel 1 application (has no administration in time range)  insulin aspart (novoLOG) injection 0-9 Units (has no administration in time range)  LORazepam (ATIVAN) tablet 1-4 mg (has no administration in time range)    Or  LORazepam (ATIVAN) injection 1-4 mg (has no administration in time range)  thiamine (VITAMIN B-1) tablet 100 mg (has no administration in time range)    Or  thiamine (B-1) injection 100 mg (has no administration in time range)  folic acid (FOLVITE) tablet 1 mg (has no administration in time range)  multivitamin with minerals tablet 1 tablet (has no administration in time range)  LORazepam (ATIVAN) injection 0-4 mg (has no administration in time range)    Followed by  LORazepam (ATIVAN) injection 0-4 mg (has no administration in time range)  labetalol (NORMODYNE) injection 5 mg (5 mg Intravenous Given 04/06/19 1315)  ipratropium-albuterol (DUONEB) 0.5-2.5 (3) MG/3ML nebulizer solution 3 mL (has no administration in time range)  polyvinyl alcohol (LIQUIFILM TEARS) 1.4 % ophthalmic solution 1 drop (has no administration in time range)  nitroGLYCERIN (NITROSTAT) SL tablet 0.4 mg (0.4 mg Sublingual Given 04/06/19 0829)  methylPREDNISolone sodium succinate (SOLU-MEDROL) 125 mg/2 mL injection 125 mg (125 mg Intravenous Given 04/06/19 0830)  albuterol (VENTOLIN HFA) 108 (90 Base) MCG/ACT inhaler 4 puff (4 puffs Inhalation Given 04/06/19 0831)  furosemide (LASIX) injection 40 mg (40 mg Intravenous Given 04/06/19 0900)  Mobility walks Low fall risk   Focused Assessments    R Recommendations: See Admitting Provider Note  Report given  to: Levander Campion rn   Additional Notes:

## 2019-04-06 NOTE — H&P (Addendum)
History and Physical    Francis Cardenas [id number] DOB: [birth year] DOA: 04/06/2019  Referring MD/NP/PA: Madalyn Rob, MD PCP: Christain Sacramento, MD  Patient coming from: Home via EMS  Chief Complaint: Shortness of breath  I have personally briefly reviewed patient's old medical records in [place]   HPI: Francis Cardenas is a 68 y.o. male with medical history significant of HTN, HLD, COPD, DM type II, BPH, and prostate cancer.  He presented with complaints of acute shortness of breath this morning around 5:45 AM.  Patient notes that he had been getting up multiple times overnight and he used the restroom as he normally does.  However, when took off his CPAP this morning he reported feeling as though he was unable to take a deep breath in. Associated symptoms of chest tightness, mild wheeze, and some lower extremity swelling.  He tried putting the CPAP mask back, without any relief of symptoms.   Denies having any recent fevers, chills, chest pain, nausea vomiting, diarrhea, or recent sick contacts.  Patient was just recently admitted to the hospital from 10/24-10/29 with the acute respiratory failure with hypoxia and hypercapnia requiring intubation.  He was suspected to have community-acquired pneumonia and treated with 5 days of Rocephin and azithromycin.  At discharge he was able to be sent home without need for supplemental oxygen.  He reports that the symptoms felt similar.   ED Course: Upon admission into the emergency department patient was noted to be afebrile, heart rate 79-1 21, respirations 15-29, blood pressure 162/89-194/95, and O2 saturation is 92 to 98% initially on a nonrebreather at 15 L.  Labs revealed WBC 10.7 Leukos 207, BNP 581, and high-sensitivity troponin 44.  Chest x-ray showing moderate congestive heart failure with diminished aeration.  COVID-19 screening was negative. Patient had been given 4 puffs of albuterol inhaler, ipratropium, 125 mg of Solu-Medrol IV,  0.4 mg of nitroglycerin sublingual, and 40 mg of Lasix. Patient was placed on a BiPAP mask.  TRH called to admit.  Review of systems: A complete 10 point review of systems was performed and negative except for as noted above in HPI.  Past Medical History:  Diagnosis Date   BPH (benign prostatic hyperplasia)    Cancer (HCC)    prostate   COPD (chronic obstructive pulmonary disease) (HCC)    Diabetes mellitus without complication ([place])    Hypercholesteremia    Hypertension     Past Surgical History:  Procedure Laterality Date   ACHILLES TENDON REPAIR Left    KNEE ARTHROSCOPY Left    TONSILLECTOMY     WRIST FRACTURE SURGERY Left      reports that he has quit smoking. He has never used smokeless tobacco. He reports current alcohol use of about 18.0 standard drinks of alcohol per week. He reports that he does not use drugs.  No Known Allergies  Family History  Problem Relation Age of Onset   CAD Mother    Leukemia Mother    Heart attack Father    Heart attack Brother     Prior to Admission medications   Medication Sig Start Date End Date Taking? Authorizing Provider  albuterol (PROVENTIL HFA;VENTOLIN HFA) 108 (90 BASE) MCG/ACT inhaler Inhale 2 puffs into the lungs every 6 (six) hours as needed for wheezing.    [provider]  ALPRAZolam Duanne Moron) 1 MG tablet Take 1 mg by mouth at bedtime. 12/05/18   [provider]  amitriptyline (ELAVIL) 10 MG tablet Take 10 mg  by mouth at bedtime.    [provider]  amLODipine (NORVASC) 10 MG tablet Take 10 mg by mouth daily with breakfast.     [provider]  aspirin EC 81 MG tablet Take 81 mg by mouth daily with breakfast.     [provider]  atorvastatin (LIPITOR) 80 MG tablet Take 40 mg by mouth daily with supper.     [provider]  budesonide-formoterol (SYMBICORT) 80-4.5 MCG/ACT inhaler Inhale 2 puffs into the lungs 2 (two) times daily.    [provider]   Carboxymethylcellulose Sodium 0.25 % SOLN Place 1 drop into both eyes 4 (four) times daily as needed (dry eyes/ irritation).    [provider]  carvedilol (COREG) 25 MG tablet Take 1 tablet (25 mg total) by mouth 2 (two) times daily with a meal. For hypertension. 03/31/13   Ruben Im, PA-C  cholecalciferol (VITAMIN D) 1000 units tablet Take 1,000 Units by mouth daily with supper.     [provider]  diclofenac sodium (VOLTAREN) 1 % GEL Apply 1 application topically 2 (two) times daily as needed (pain).  10/12/18   [provider]  Ensure Max Protein (ENSURE MAX PROTEIN) LIQD Take 330 mLs (11 oz total) by mouth 2 (two) times daily. 12/12/18   [name], [name], MD  furosemide (LASIX) 40 MG tablet Take 1 tablet (40 mg total) by mouth 2 (two) times daily. Patient taking differently: Take 40 mg by mouth 2 (two) times daily with a meal.  01/31/18   Barton Dubois, MD  gabapentin (NEURONTIN) 300 MG capsule Take 300 mg by mouth 3 (three) times daily.    [provider]  lisinopril (PRINIVIL,ZESTRIL) 40 MG tablet Take 1 tablet (40 mg total) by mouth daily. For hypertension. Patient taking differently: Take 40 mg by mouth daily with breakfast. For hypertension. 03/31/13   Nena Polio T, PA-C  magnesium oxide (MAG-OX) 400 MG tablet Take 1 tablet (400 mg total) by mouth daily. Patient taking differently: Take 400 mg by mouth daily with supper.  01/31/18   Barton Dubois, MD  meloxicam (MOBIC) 15 MG tablet Take 15 mg by mouth at bedtime. 10/31/18   [provider]  metFORMIN (GLUCOPHAGE) 500 MG tablet Take 500 mg by mouth daily with breakfast.    [provider]  Multiple Vitamins-Minerals (MULTIVITAMIN WITH MINERALS) tablet Take 1 tablet by mouth daily with lunch.     [provider]  omeprazole (PRILOSEC) 40 MG capsule Take 40 mg by mouth daily with supper.     [provider]  OXcarbazepine (TRILEPTAL) 150 MG tablet Take 300 mg by  mouth 3 (three) times daily.     [provider]  potassium chloride SA (K-DUR,KLOR-CON) 20 MEQ tablet Take 2 tablets (40 mEq total) by mouth daily. Patient taking differently: Take 40 mEq by mouth daily with supper.  01/31/18   Barton Dubois, MD  PRESCRIPTION MEDICATION Inhale into the lungs at bedtime. CPAP    [provider]  sertraline (ZOLOFT) 100 MG tablet Take one pill by mouth each day for depression and anxiety. Patient taking differently: Take 150 mg by mouth daily with breakfast.  03/31/13   Nena Polio T, PA-C  tiotropium (SPIRIVA HANDIHALER) 18 MCG inhalation capsule Place 1 capsule (18 mcg total) into inhaler and inhale daily. Patient not taking: Reported on 03/16/2019 12/12/18 12/12/19  [name], [name], MD  vitamin B-12 (CYANOCOBALAMIN) 1000 MCG tablet Take 1,000 mcg by mouth daily with supper.  [provider]    Physical Exam:  Constitutional: Elderly obese male currently in NAD, calm, comfortable Vitals:   04/06/19 0915 04/06/19 0930 04/06/19 0945 04/06/19 1000  BP: (!) 180/82 (!) 174/86 (!) 171/79 (!) 179/72  Pulse: 88 90 96 92  Resp: (!) 21 20 (!) 23 20  Temp:      SpO2: 92% 95% 94% 94%  Weight:      Height:       Eyes: PERRL, lids and conjunctivae normal ENMT: Mucous membranes are moist. Posterior pharynx clear of any exudate or lesions.Normal dentition.  Neck: Increased neck circumference.  Unable to appreciate JVD. Respiratory: Positive crackles noted in the mid to lower lung fields.  Patient currently on BiPAP with normal respiratory rate. Cardiovascular: Regular rate and rhythm, no murmurs / rubs / gallops.  1+ pitting edema to the mid shin bilaterally . 2+ pedal pulses. No carotid bruits.  Abdomen: no tenderness, no masses palpated. No hepatosplenomegaly. Bowel sounds positive.  Musculoskeletal: no clubbing / cyanosis. No joint deformity upper and lower extremities. Good ROM, no contractures. Normal muscle tone.  Skin: no rashes,  lesions, ulcers. No induration Neurologic: CN 2-12 grossly intact. Sensation intact, DTR normal. Strength 5/5 in all 4.  Psychiatric: Normal judgment and insight. Alert and oriented x 3. Normal mood.     Labs on Admission: I have personally reviewed following labs and imaging studies  CBC: Recent Labs  Lab 04/06/19 0813  WBC 10.7*  NEUTROABS 9.6*  HGB 14.9  HCT 46.2  MCV 99.6  PLT [id number]   Basic Metabolic Panel: Recent Labs  Lab 04/06/19 0813  NA 142  K 3.7  CL 105  CO2 23  GLUCOSE 207*  BUN 8  CREATININE 0.69  CALCIUM 9.8   GFR: Estimated Creatinine Clearance: 115 mL/min (by C-G formula based on SCr of 0.69 mg/dL). Liver Function Tests: No results for input(s): AST, ALT, ALKPHOS, BILITOT, PROT, ALBUMIN in the last 168 hours. No results for input(s): LIPASE, AMYLASE in the last 168 hours. No results for input(s): AMMONIA in the last 168 hours. Coagulation Profile: No results for input(s): INR, PROTIME in the last 168 hours. Cardiac Enzymes: No results for input(s): CKTOTAL, CKMB, CKMBINDEX, TROPONINI in the last 168 hours. BNP (last 3 results) No results for input(s): PROBNP in the last 8760 hours. HbA1C: No results for input(s): HGBA1C in the last 72 hours. CBG: No results for input(s): GLUCAP in the last 168 hours. Lipid Profile: No results for input(s): CHOL, HDL, LDLCALC, TRIG, CHOLHDL, LDLDIRECT in the last 72 hours. Thyroid Function Tests: No results for input(s): TSH, T4TOTAL, FREET4, T3FREE, THYROIDAB in the last 72 hours. Anemia Panel: No results for input(s): VITAMINB12, FOLATE, FERRITIN, TIBC, IRON, RETICCTPCT in the last 72 hours. Urine analysis:    Component Value Date/Time   COLORURINE STRAW (A) 03/16/2019 0810   APPEARANCEUR CLEAR 03/16/2019 0810   LABSPEC 1.005 03/16/2019 0810   PHURINE 6.0 03/16/2019 0810   GLUCOSEU 50 (A) 03/16/2019 0810   HGBUR NEGATIVE 03/16/2019 0810   BILIRUBINUR NEGATIVE 03/16/2019 0810   KETONESUR NEGATIVE  03/16/2019 0810   PROTEINUR 100 (A) 03/16/2019 0810   UROBILINOGEN 0.2 03/24/2013 1255   NITRITE NEGATIVE 03/16/2019 0810   LEUKOCYTESUR NEGATIVE 03/16/2019 0810   Sepsis Labs: Recent Results (from the past 240 hour(s))  SARS Coronavirus 2 by RT PCR (hospital order, performed in [facility] hospital lab) Nasopharyngeal Nasopharyngeal Swab     Status: None   Collection Time: 04/06/19  8:51 AM  Specimen: Nasopharyngeal Swab  Result Value Ref Range Status   SARS Coronavirus 2 NEGATIVE NEGATIVE Final    Comment: (NOTE) If result is NEGATIVE SARS-CoV-2 target nucleic acids are NOT DETECTED. The SARS-CoV-2 RNA is generally detectable in upper and lower  respiratory specimens during the acute phase of infection. The lowest  concentration of SARS-CoV-2 viral copies this assay can detect is 250  copies / mL. A negative result does not preclude SARS-CoV-2 infection  and should not be used as the sole basis for treatment or other  patient management decisions.  A negative result may occur with  improper specimen collection / handling, submission of specimen other  than nasopharyngeal swab, presence of viral mutation(s) within the  areas targeted by this assay, and inadequate number of viral copies  (<250 copies / mL). A negative result must be combined with clinical  observations, patient history, and epidemiological information. If result is POSITIVE SARS-CoV-2 target nucleic acids are DETECTED. The SARS-CoV-2 RNA is generally detectable in upper and lower  respiratory specimens dur ing the acute phase of infection.  Positive  results are indicative of active infection with SARS-CoV-2.  Clinical  correlation with patient history and other diagnostic information is  necessary to determine patient infection status.  Positive results do  not rule out bacterial infection or co-infection with other viruses. If result is PRESUMPTIVE POSTIVE SARS-CoV-2 nucleic acids MAY BE PRESENT.   A  presumptive positive result was obtained on the submitted specimen  and confirmed on repeat testing.  While 2019 novel coronavirus  (SARS-CoV-2) nucleic acids may be present in the submitted sample  additional confirmatory testing may be necessary for epidemiological  and / or clinical management purposes  to differentiate between  SARS-CoV-2 and other Sarbecovirus currently known to infect humans.  If clinically indicated additional testing with an alternate test  methodology [phone]) is advised. The SARS-CoV-2 RNA is generally  detectable in upper and lower respiratory sp ecimens during the acute  phase of infection. The expected result is Negative. Fact Sheet for Patients:  StrictlyIdeas.no Fact Sheet for Healthcare Providers: BankingDealers.co.za This test is not yet approved or cleared by the [place] FDA and has been authorized for detection and/or diagnosis of SARS-CoV-2 by FDA under an Emergency Use Authorization (EUA).  This EUA will remain in effect (meaning this test can be used) for the duration of the COVID-19 declaration under Section 564(b)(1) of the Act, 21 U.S.C. section 360bbb-3(b)(1), unless the authorization is terminated or revoked sooner. Performed at [facility], [place] [address], [place], [place] [address]      Radiological Exams on Admission: Dg Chest Portable 1 View  Result Date: 04/06/2019 CLINICAL DATA:  Heart failure. EXAM: PORTABLE CHEST 1 VIEW COMPARISON:  03/18/2019 FINDINGS: Normal heart size. Bilateral pleural effusions and pulmonary edema identified compatible with moderate CHF. Diminished aeration to bilateral mid and lower lung zones identified. Upper lobes are relatively clear. IMPRESSION: 1. Moderate CHF. 2. Diminished aeration to bilateral mid and lower lung zones. Electronically Signed   By: Kerby Moors M.D.   On: 04/06/2019 09:32    EKG: Independently reviewed.  Sinus tachycardia  110 bpm  Assessment/Plan Respiratory failure with hypoxia secondary to systolic congestive heart failure exacerbation: Acute on chronic.  Patient presents with acute shortness of breath.  Chest x-ray showing signs of congestive heart failure.  BNP elevated at 581, but less than previous.  Last echocardiogram revealed EF of 50% -Admit to a telemetry bed -Heart failure orders set  initiated  -Continuous pulse oximetry with nasal cannula oxygen as needed to keep O2 saturations >92% -N.p.o.  -BiPAP -Strict I&Os and daily weights -Elevate lower extremities -Lasix 40 mg IV Bid -Reassess in a.m. and adjust diuresis as needed. -Check echocardiogram -Optimize medical management as able -Cardiology will need to be consulted in a.m.  Elevated troponin: Acute.  Troponin elevated at 44 ->136.  Suspecting possibly related to acute demand given CHF. -Continue to trend cardiac troponin  Hypertensive urgency: Initial blood pressures elevated up to 194/95. -Continue amlodipine, Coreg, and lisinopril once off bipap -Labetalol IV as needed  COPD, without acute exacerbation -DuoNebs as needed shortness of breath/wheezing -Change Symbicort to budesonide and Brovana nebs  Diabetes mellitus type 2: Glucose initially elevated up to 207. Hemoglobin A1c 5.3 on 03/18/2019. -Hypoglycemic protocol  -Hold Metformin -BG's before every meal with sensitive SSI  Alcohol abuse -CIWA protocols with scheduled Ativan as needed  OSA on CPAP -CPAP  Morbid obesity: BMI 44.92 kg/m2   DVT prophylaxis: lovenox  Code Status: Full  Family Communication: No family present at bedside Disposition Plan: Likely discharge home in 2 to 3 days. Consults called: cardiology Admission status: inpatient   Norval Morton MD [facility] Pager [phone]   If 7PM-7AM, please contact night-coverage [email or web address] Password [email or web address]  04/06/2019, 10:22 AM

## 2019-04-06 NOTE — Progress Notes (Signed)
Carb diet order per previous order since patient is not on BiPap

## 2019-04-06 NOTE — ED Provider Notes (Addendum)
[facility] EMERGENCY DEPARTMENT Provider Note   CSN: [id number] Arrival date & time:        History   Chief Complaint No chief complaint on file.   HPI Francis Cardenas is a 68 y.o. male.  Presents emerged from chief complaint shortness of breath.  Patient states was feeling fine yesterday without any significant issues in breathing.  Woke up this morning with significant shortness of breath.  States breathing problems came on suddenly, no associated chest pain.  He has not had any cough or fevers associated with this.  Try taking his inhaler at home without any change in symptoms.    Completed chart review - recent admission for respiratory failure, required intubation, CHF vs COPD vs CAP.     HPI  Past Medical History:  Diagnosis Date  . BPH (benign prostatic hyperplasia)   . Cancer [facility])    prostate  . COPD (chronic obstructive pulmonary disease) ([place])   . Diabetes mellitus without complication ([place])   . Hypercholesteremia   . Hypertension     Patient Active Problem List   Diagnosis Date Noted  . Community acquired pneumonia   . COPD with acute exacerbation ([place]) 12/12/2018  . Obesity, Class III, BMI 40-49.9 (morbid obesity) ([place]) 12/12/2018  . Chronic diastolic CHF (congestive heart failure) ([place]) 12/10/2018  . Fall 12/10/2018  . Multiple rib fractures 12/10/2018  . OSA on CPAP 12/10/2018  . Hypokalemia 12/10/2018  . Acute on chronic systolic CHF (congestive heart failure) ([place])   . Class 2 severe obesity due to excess calories with serious comorbidity and body mass index (BMI) of 37.0 to 37.9 in adult [facility])   . CHF (congestive heart failure) ([place]) 01/28/2018  . Acute CHF (congestive heart failure) ([place]) 12/31/2017  . Respiratory failure, acute ([address]) 12/30/2017  . Acute exacerbation of CHF (congestive heart failure) ([place]) 12/30/2017  . Chest pain syndrome 12/30/2017  . Carcinoma of prostate ([place]) 08/02/2016  . COPD (chronic obstructive pulmonary  disease) ([place]) 08/02/2016  . GERD (gastroesophageal reflux disease) 08/02/2016  . Hyperlipidemia 08/02/2016  . Peripheral neuropathy 08/02/2016  . Steatosis of liver 08/02/2016  . Type 2 diabetes mellitus with other specified complication ([place]) [id number]  . Benign essential hypertension 06/11/2013  . Alcohol dependence ([place]) 03/26/2013  . PTSD (post-traumatic stress disorder) 03/26/2013  . Alcohol abuse 03/24/2013  . Depressive disorder 03/24/2013    Past Surgical History:  Procedure Laterality Date  . ACHILLES TENDON REPAIR Left   . KNEE ARTHROSCOPY Left   . TONSILLECTOMY    . WRIST FRACTURE SURGERY Left         Home Medications    Prior to Admission medications   Medication Sig Start Date End Date Taking? Authorizing Provider  albuterol (PROVENTIL HFA;VENTOLIN HFA) 108 (90 BASE) MCG/ACT inhaler Inhale 2 puffs into the lungs every 6 (six) hours as needed for wheezing.    [provider]  ALPRAZolam Duanne Moron) 1 MG tablet Take 1 mg by mouth at bedtime. 12/05/18   [provider]  amitriptyline (ELAVIL) 10 MG tablet Take 10 mg by mouth at bedtime.    [provider]  amLODipine (NORVASC) 10 MG tablet Take 10 mg by mouth daily with breakfast.     [provider]  aspirin EC 81 MG tablet Take 81 mg by mouth daily with breakfast.     [provider]  atorvastatin (LIPITOR) 80 MG tablet Take 40 mg by mouth daily with supper.     [provider]  budesonide-formoterol (SYMBICORT) 80-4.5 MCG/ACT inhaler Inhale 2 puffs into the lungs 2 (two) times daily.    [provider]  Carboxymethylcellulose Sodium 0.25 % SOLN Place 1 drop into both eyes 4 (four) times daily as needed (dry eyes/ irritation).    [provider]  carvedilol (COREG) 25 MG tablet Take 1 tablet (25 mg total) by mouth 2 (two) times daily with a meal. For hypertension. 03/31/13   Ruben Im, PA-C  cholecalciferol (VITAMIN D) 1000 units tablet Take  1,000 Units by mouth daily with supper.     [provider]  diclofenac sodium (VOLTAREN) 1 % GEL Apply 1 application topically 2 (two) times daily as needed (pain).  10/12/18   [provider]  Ensure Max Protein (ENSURE MAX PROTEIN) LIQD Take 330 mLs (11 oz total) by mouth 2 (two) times daily. 12/12/18   [name], [name], MD  furosemide (LASIX) 40 MG tablet Take 1 tablet (40 mg total) by mouth 2 (two) times daily. Patient taking differently: Take 40 mg by mouth 2 (two) times daily with a meal.  01/31/18   Barton Dubois, MD  gabapentin (NEURONTIN) 300 MG capsule Take 300 mg by mouth 3 (three) times daily.    [provider]  lisinopril (PRINIVIL,ZESTRIL) 40 MG tablet Take 1 tablet (40 mg total) by mouth daily. For hypertension. Patient taking differently: Take 40 mg by mouth daily with breakfast. For hypertension. 03/31/13   Nena Polio T, PA-C  magnesium oxide (MAG-OX) 400 MG tablet Take 1 tablet (400 mg total) by mouth daily. Patient taking differently: Take 400 mg by mouth daily with supper.  01/31/18   Barton Dubois, MD  meloxicam (MOBIC) 15 MG tablet Take 15 mg by mouth at bedtime. 10/31/18   [provider]  metFORMIN (GLUCOPHAGE) 500 MG tablet Take 500 mg by mouth daily with breakfast.    [provider]  Multiple Vitamins-Minerals (MULTIVITAMIN WITH MINERALS) tablet Take 1 tablet by mouth daily with lunch.     [provider]  omeprazole (PRILOSEC) 40 MG capsule Take 40 mg by mouth daily with supper.     [provider]  OXcarbazepine (TRILEPTAL) 150 MG tablet Take 300 mg by mouth 3 (three) times daily.     [provider]  potassium chloride SA (K-DUR,KLOR-CON) 20 MEQ tablet Take 2 tablets (40 mEq total) by mouth daily. Patient taking differently: Take 40 mEq by mouth daily with supper.  01/31/18   Barton Dubois, MD  PRESCRIPTION MEDICATION Inhale into the lungs at bedtime. CPAP    [provider]  sertraline  (ZOLOFT) 100 MG tablet Take one pill by mouth each day for depression and anxiety. Patient taking differently: Take 150 mg by mouth daily with breakfast.  03/31/13   Nena Polio T, PA-C  tiotropium (SPIRIVA HANDIHALER) 18 MCG inhalation capsule Place 1 capsule (18 mcg total) into inhaler and inhale daily. Patient not taking: Reported on 03/16/2019 12/12/18 12/12/19  [name], [name], MD  vitamin B-12 (CYANOCOBALAMIN) 1000 MCG tablet Take 1,000 mcg by mouth daily with supper.     [provider]    Family History Family History  Problem Relation Age of Onset  . CAD Mother   . Leukemia Mother   . Heart attack Father   . Heart attack Brother     Social History Social History   Tobacco Use  . Smoking status: Former Research scientist (life sciences)  . Smokeless tobacco: Never Used  Substance Use Topics  . Alcohol use: Yes  Alcohol/week: 18.0 standard drinks    Types: 18 Cans of beer per week    Comment: 18 cans beer/day  . Drug use: No     Allergies   Patient has no known allergies.   Review of Systems Review of Systems  Constitutional: Negative for chills and fever.  HENT: Negative for ear pain and sore throat.   Eyes: Negative for pain and visual disturbance.  Respiratory: Positive for shortness of breath. Negative for cough.   Cardiovascular: Negative for chest pain and palpitations.  Gastrointestinal: Negative for abdominal pain and vomiting.  Genitourinary: Negative for dysuria and hematuria.  Musculoskeletal: Negative for arthralgias and back pain.  Skin: Negative for color change and rash.  Neurological: Negative for seizures and syncope.  All other systems reviewed and are negative.    Physical Exam Updated Vital Signs BP (!) 190/87 (BP Location: Left Arm)   Pulse (!) 110   Temp (!) 97.5 F (36.4 C)   Resp (!) 27   Ht 5\' 8"  (1.727 m)   Wt 134 kg   SpO2 96%   BMI 44.92 kg/m   Physical Exam Vitals signs and nursing note reviewed.  Constitutional:      Appearance:  He is well-developed.     Comments: Appears to be in mild to moderate respiratory distress, but normal mental status and speaking in short sentences  HENT:     Head: Normocephalic and atraumatic.  Eyes:     Conjunctiva/sclera: Conjunctivae normal.  Neck:     Musculoskeletal: Neck supple.  Cardiovascular:     Rate and Rhythm: Regular rhythm. Tachycardia present.     Heart sounds: No murmur.  Pulmonary:     Comments: Speaking in short phrases, increased work of breathing, tachypnea, poor air movement bilaterally, on 15 L nonrebreather Abdominal:     Palpations: Abdomen is soft.     Tenderness: There is no abdominal tenderness.  Skin:    General: Skin is warm and dry.  Neurological:     General: No focal deficit present.     Mental Status: He is alert and oriented to person, place, and time.      ED Treatments / Results  Labs (all labs ordered are listed, but only abnormal results are displayed) Labs Reviewed  SARS CORONAVIRUS 2 (TAT 6-24 HRS)  CBC WITH DIFFERENTIAL/PLATELET  BASIC METABOLIC PANEL  BRAIN NATRIURETIC PEPTIDE  TROPONIN I (HIGH SENSITIVITY)    EKG EKG Interpretation  Date/Time:  Saturday April 06 2019 08:05:54 EST Ventricular Rate:  110 PR Interval:    QRS Duration: 121 QT Interval:  333 QTC Calculation: 451 R Axis:   63 Text Interpretation: Sinus tachycardia Nonspecific intraventricular conduction delay Borderline repolarization abnormality No signficant change when compared to prior ECG on 03/16/2019 Confirmed by Madalyn Rob [phone]) on 04/06/2019 8:29:27 AM   Radiology No results found.  Procedures .Critical Care Performed by: Lucrezia Starch, MD Authorized by: Lucrezia Starch, MD   Critical care provider statement:    Critical care time (minutes):  45   Critical care was necessary to treat or prevent imminent or life-threatening deterioration of the following conditions:  Respiratory failure, circulatory failure and cardiac  failure   Critical care was time spent personally by me on the following activities:  Discussions with consultants, evaluation of patient's response to treatment, examination of patient, ordering and performing treatments and interventions, ordering and review of laboratory studies, ordering and review of radiographic studies, pulse oximetry, re-evaluation of patient's condition, obtaining history from  patient or surrogate and review of old charts   (including critical care time)  Medications Ordered in ED Medications  methylPREDNISolone sodium succinate (SOLU-MEDROL) 125 mg/2 mL injection 125 mg (has no administration in time range)  albuterol (VENTOLIN HFA) 108 (90 Base) MCG/ACT inhaler 4 puff (has no administration in time range)  ipratropium (ATROVENT HFA) inhaler 2 puff (has no administration in time range)  nitroGLYCERIN (NITROSTAT) SL tablet 0.4 mg (0.4 mg Sublingual Given 04/06/19 0829)     Initial Impression / Assessment and Plan / ED Course  I have reviewed the triage vital signs and the nursing notes.  Pertinent labs & imaging results that were available during my care of the patient were reviewed by me and considered in my medical decision making (see chart for details).  Clinical Course as of Apr 05 1208  Sat Apr 06, 2019  [id number] Completed initial assessment, will try BiPAP, give additional nitro, steroids, albuterol and reassess   [RD]  [phone] Discussed with respiratory, made aware of urgency for BiPAP   [RD]  0910 Rechecked, marked improvement in work of breathing on BiPAP   [RD]  1000 Rechecked, continues to tolerate BiPAP well, reviewed results, will c/s hospitalist   [RD]    Clinical Course User Index [RD] Lucrezia Starch, MD       68 year old male with past medical history of CHF, COPD presents with shortness of breath.  On initial exam, patient had significant increased work of breathing, poor air entry bilaterally.  Concern for respiratory failure from either  COPD versus CHF.  Also noted to be hypertensive.  Treated with Lasix, nitroglycerin, gave inhaler treatment as well.  Started BiPAP.  Patient had significant improvement in his symptoms after initiating BiPAP therapy.  Chest x-ray is concerning for significant fluid overload.  BNP was also elevated.  Feel most likely his symptoms are related to heart failure.  Patient had good mental status throughout this process and tolerated BiPAP well.  Will admit to the hospitalist service for further management.  Dr. Tamala Julian accepting hospitalist.   Final Clinical Impressions(s) / ED Diagnoses   Final diagnoses:  Acute on chronic heart failure, unspecified heart failure type [facility])  Acute respiratory failure with hypoxia [facility])    ED Discharge Orders    None       Lucrezia Starch, MD 04/06/19 1213    Lucrezia Starch, MD 04/20/19 1158

## 2019-04-06 NOTE — ED Notes (Signed)
Attempt to draw lab with no success

## 2019-04-06 NOTE — ED Triage Notes (Signed)
Pt BIB [facility] from home with compliant of acute onset of shortness of breath this morning. Pt with history of CHF recently upped dosage of lasix but still not helping. Lung sounds diminished. Pt arrives on NRB. Pt initially on [place] 2 L baseline @ home. Pt put in CPAP by EMS but responded better to NRB.

## 2019-04-06 NOTE — Progress Notes (Signed)
Per pt he does wear 2L oxygen at home and also CPAP 14cmH2O QHS.

## 2019-04-07 DIAGNOSIS — E876 Hypokalemia: Secondary | ICD-10-CM

## 2019-04-07 DIAGNOSIS — R778 Other specified abnormalities of plasma proteins: Secondary | ICD-10-CM

## 2019-04-07 DIAGNOSIS — F1010 Alcohol abuse, uncomplicated: Secondary | ICD-10-CM

## 2019-04-07 DIAGNOSIS — I160 Hypertensive urgency: Secondary | ICD-10-CM

## 2019-04-07 DIAGNOSIS — I5023 Acute on chronic systolic (congestive) heart failure: Secondary | ICD-10-CM

## 2019-04-07 DIAGNOSIS — G4733 Obstructive sleep apnea (adult) (pediatric): Secondary | ICD-10-CM

## 2019-04-07 LAB — GLUCOSE, CAPILLARY
Glucose-Capillary: 124 mg/dL — ABNORMAL HIGH (ref 70–99)
Glucose-Capillary: 125 mg/dL — ABNORMAL HIGH (ref 70–99)
Glucose-Capillary: 109 mg/dL — ABNORMAL HIGH (ref 70–99)
Glucose-Capillary: 105 mg/dL — ABNORMAL HIGH (ref 70–99)

## 2019-04-07 LAB — CBC WITH DIFFERENTIAL/PLATELET
Abs Immature Granulocytes: 0.06 10*3/uL (ref 0.00–0.07)
Basophils Absolute: 0 10*3/uL (ref 0.0–0.1)
Basophils Relative: 0 %
Eosinophils Absolute: 0 10*3/uL (ref 0.0–0.5)
Eosinophils Relative: 0 %
HCT: 41.6 % (ref 39.0–52.0)
Hemoglobin: 13.6 g/dL (ref 13.0–17.0)
Immature Granulocytes: 1 %
Lymphocytes Relative: 8 %
Lymphs Abs: 0.6 10*3/uL — ABNORMAL LOW (ref 0.7–4.0)
MCH: 31.6 pg (ref 26.0–34.0)
MCHC: 32.7 g/dL (ref 30.0–36.0)
MCV: 96.5 fL (ref 80.0–100.0)
Monocytes Absolute: 0.3 10*3/uL (ref 0.1–1.0)
Monocytes Relative: 4 %
Neutro Abs: 6.8 10*3/uL (ref 1.7–7.7)
Neutrophils Relative %: 87 %
Platelets: 193 10*3/uL (ref 150–400)
RBC: 4.31 MIL/uL (ref 4.22–5.81)
RDW: 14.7 % (ref 11.5–15.5)
WBC: 7.7 10*3/uL (ref 4.0–10.5)
nRBC: 0 % (ref 0.0–0.2)

## 2019-04-07 LAB — BASIC METABOLIC PANEL
Anion gap: 15 (ref 5–15)
BUN: 9 mg/dL (ref 8–23)
CO2: 29 mmol/L (ref 22–32)
Calcium: 10.1 mg/dL (ref 8.9–10.3)
Chloride: 102 mmol/L (ref 98–111)
Creatinine, Ser: 0.78 mg/dL (ref 0.61–1.24)
GFR calc Af Amer: >60 mL/min (ref >60)
GFR calc non Af Amer: >60 mL/min (ref >60)
Glucose, Bld: 135 mg/dL — ABNORMAL HIGH (ref 70–99)
Potassium: 3.1 mmol/L — ABNORMAL LOW (ref 3.5–5.1)
Sodium: 146 mmol/L — ABNORMAL HIGH (ref 135–145)

## 2019-04-07 MED ORDER — SPIRONOLACTONE 25 MG PO TABS
25.0000 mg | ORAL_TABLET | Freq: Every day | ORAL | Status: DC
  Administered 2019-04-07 – 2019-04-10 (×3): 25 mg via ORAL
  Filled 2019-04-07 (×3): qty 1

## 2019-04-07 MED ORDER — POTASSIUM CHLORIDE CRYS ER 20 MEQ PO TBCR
40.0000 meq | EXTENDED_RELEASE_TABLET | ORAL | Status: AC
  Administered 2019-04-07 (×2): 40 meq via ORAL
  Filled 2019-04-07 (×2): qty 2

## 2019-04-07 MED ORDER — INSULIN ASPART 100 UNIT/ML ~~LOC~~ SOLN: 0.0000 [IU] | Freq: Every day | SUBCUTANEOUS | Status: DC

## 2019-04-07 MED ORDER — INSULIN ASPART 100 UNIT/ML ~~LOC~~ SOLN
0.0000 [IU] | Freq: Three times a day (TID) | SUBCUTANEOUS | Status: DC
  Administered 2019-04-08: 1 [IU] via SUBCUTANEOUS

## 2019-04-07 NOTE — Progress Notes (Addendum)
PROGRESS NOTE    Francis Cardenas  [id number] DOB: [birth year] DOA: 04/06/2019 PCP: Christain Sacramento, MD   Brief Narrative:  HPI: Francis Cardenas is a 68 y.o. male with medical history significant of HTN, HLD, COPD, DM type II, BPH, and prostate cancer.  He presented with complaints of acute shortness of breath this morning around 5:45 AM.  Patient notes that he had been getting up multiple times overnight and he used the restroom as he normally does.  However, when took off his CPAP this morning he reported feeling as though he was unable to take a deep breath in. Associated symptoms of chest tightness, mild wheeze, and some lower extremity swelling.  He tried putting the CPAP mask back, without any relief of symptoms.   Denies having any recent fevers, chills, chest pain, nausea vomiting, diarrhea, or recent sick contacts.  Patient was just recently admitted to the hospital from 10/24-10/29 with the acute respiratory failure with hypoxia and hypercapnia requiring intubation.  He was suspected to have community-acquired pneumonia and treated with 5 days of Rocephin and azithromycin.  At discharge he was able to be sent home without need for supplemental oxygen.  He reports that the symptoms felt similar.   ED Course: Upon admission into the emergency department patient was noted to be afebrile, heart rate 79-1 21, respirations 15-29, blood pressure 162/89-194/95, and O2 saturation is 92 to 98% initially on a nonrebreather at 15 L.  Labs revealed WBC 10.7 Leukos 207, BNP 581, and high-sensitivity troponin 44.  Chest x-ray showing moderate congestive heart failure with diminished aeration.  COVID-19 screening was negative. Patient had been given 4 puffs of albuterol inhaler, ipratropium, 125 mg of Solu-Medrol IV, 0.4 mg of nitroglycerin sublingual, and 40 mg of Lasix. Patient was placed on a BiPAP mask.  TRH called to admit.  Assessment & Plan:   Principal Problem:   Acute on chronic systolic CHF  (congestive heart failure) (HCC) Active Problems:   Alcohol abuse   COPD (chronic obstructive pulmonary disease) (HCC)   Diabetes mellitus type 2 in obese (HCC)   Acute respiratory failure with hypoxia (HCC)   Morbid obesity ([place])   Hypertensive urgency   Elevated troponin  Acute hypoxic respiratory failure secondary to acute on chronic congestive heart failure with preserved ejection fraction: Patient is doing much better today.  He is down to only 4 L on nasal oxygen compared to nonrebreather 15 L.  Net balance is -116 2 mL.  Continue Lasix 40 mg IV twice daily, fluid restriction, low-sodium diet and daily I's and O's and daily weight.  Elevated troponin/chest pressure: He also had chest pressure and has elevated troponin.  I have consulted cardiology for their input.  Monitor on telemetry.  Hypertensive urgency: Blood pressure much better now.  Continue amlodipine, Coreg and lisinopril.  COPD without acute exacerbation: Duo neb as needed.  Continue home medications.  Type 2 diabetes mellitus: Interestingly, his hemoglobin A1c x2 has been only 5.4.  He is hyperglycemic here.  Continue SSI.  Alcohol abuse: No signs of withdrawal.  Continue CIWA protocol with multivitamins.  Morbid obesity: BMI 44.92.  Hypokalemia: 3.1.  Replace orally.  Recheck in the morning.  OSA on CPAP  DVT prophylaxis: Lovenox Code Status: Full code Family Communication:  None present at bedside.  Plan of care discussed with patient in length and he verbalized understanding and agreed with it. Disposition Plan: Home when improved and cleared by cardiology.  Estimated body mass index is  41.21 kg/m as calculated from the following:   Height as of this encounter: 5\' 8"  (1.727 m).   Weight as of this encounter: 122.9 kg.      Nutritional status:               Consultants:   Cardiology  Procedures:   None  Antimicrobials:   None   Subjective: Patient seen and examined.  He states  that he feels much better than yesterday.  No new complaint.  No more chest pain.  Objective: Vitals:   04/07/19 0738 04/07/19 0739 04/07/19 0751 04/07/19 1128  BP:   (!) 167/71 (!) 150/62  Pulse:   62 60  Resp:   17 19  Temp:   98.4 F (36.9 C) 98.3 F (36.8 C)  TempSrc:   Oral Oral  SpO2: 96% 96% 97% 99%  Weight:      Height:        Intake/Output Summary (Last 24 hours) at 04/07/2019 1325 Last data filed at 04/07/2019 1129 Gross per 24 hour  Intake 963 ml  Output 2125 ml  Net -1162 ml   Filed Weights   04/06/19 0804 04/06/19 1631 04/07/19 0458  Weight: 134 kg 125.3 kg 122.9 kg    Examination:  General exam: Appears calm and comfortable  Respiratory system: Fine crackles at the bases bilaterally Cardiovascular system: S1 & S2 heard, RRR. No JVD, murmurs, rubs, gallops or clicks. No pedal edema. Gastrointestinal system: Abdomen is nondistended, soft and nontender. No organomegaly or masses felt. Normal bowel sounds heard. Central nervous system: Alert and oriented. No focal neurological deficits. Extremities: Symmetric 5 x 5 power. Skin: No rashes, lesions or ulcers Psychiatry: Judgement and insight appear normal. Mood & affect appropriate.    Data Reviewed: I have personally reviewed following labs and imaging studies  CBC: Recent Labs  Lab 04/06/19 0813 04/07/19 0439  WBC 10.7* 7.7  NEUTROABS 9.6* 6.8  HGB 14.9 13.6  HCT 46.2 41.6  MCV 99.6 96.5  PLT 200 0000000   Basic Metabolic Panel: Recent Labs  Lab 04/06/19 0813 04/07/19 0439  NA 142 146*  K 3.7 3.1*  CL 105 102  CO2 23 29  GLUCOSE 207* 135*  BUN 8 9  CREATININE 0.69 0.78  CALCIUM 9.8 10.1   GFR: Estimated Creatinine Clearance: 109.6 mL/min (by C-G formula based on SCr of 0.78 mg/dL). Liver Function Tests: No results for input(s): AST, ALT, ALKPHOS, BILITOT, PROT, ALBUMIN in the last 168 hours. No results for input(s): LIPASE, AMYLASE in the last 168 hours. No results for input(s): AMMONIA  in the last 168 hours. Coagulation Profile: No results for input(s): INR, PROTIME in the last 168 hours. Cardiac Enzymes: No results for input(s): CKTOTAL, CKMB, CKMBINDEX, TROPONINI in the last 168 hours. BNP (last 3 results) No results for input(s): PROBNP in the last 8760 hours. HbA1C: No results for input(s): HGBA1C in the last 72 hours. CBG: Recent Labs  Lab 04/06/19 1651 04/06/19 2135 04/07/19 0625 04/07/19 1237  GLUCAP 149* 147* 124* 125*   Lipid Profile: No results for input(s): CHOL, HDL, LDLCALC, TRIG, CHOLHDL, LDLDIRECT in the last 72 hours. Thyroid Function Tests: No results for input(s): TSH, T4TOTAL, FREET4, T3FREE, THYROIDAB in the last 72 hours. Anemia Panel: No results for input(s): VITAMINB12, FOLATE, FERRITIN, TIBC, IRON, RETICCTPCT in the last 72 hours. Sepsis Labs: No results for input(s): PROCALCITON, LATICACIDVEN in the last 168 hours.  Recent Results (from the past 240 hour(s))  SARS Coronavirus 2 by RT PCR (  hospital order, performed in [facility] hospital lab) Nasopharyngeal Nasopharyngeal Swab     Status: None   Collection Time: 04/06/19  8:51 AM   Specimen: Nasopharyngeal Swab  Result Value Ref Range Status   SARS Coronavirus 2 NEGATIVE NEGATIVE Final    Comment: (NOTE) If result is NEGATIVE SARS-CoV-2 target nucleic acids are NOT DETECTED. The SARS-CoV-2 RNA is generally detectable in upper and lower  respiratory specimens during the acute phase of infection. The lowest  concentration of SARS-CoV-2 viral copies this assay can detect is 250  copies / mL. A negative result does not preclude SARS-CoV-2 infection  and should not be used as the sole basis for treatment or other  patient management decisions.  A negative result may occur with  improper specimen collection / handling, submission of specimen other  than nasopharyngeal swab, presence of viral mutation(s) within the  areas targeted by this assay, and inadequate number of viral copies   (<250 copies / mL). A negative result must be combined with clinical  observations, patient history, and epidemiological information. If result is POSITIVE SARS-CoV-2 target nucleic acids are DETECTED. The SARS-CoV-2 RNA is generally detectable in upper and lower  respiratory specimens dur ing the acute phase of infection.  Positive  results are indicative of active infection with SARS-CoV-2.  Clinical  correlation with patient history and other diagnostic information is  necessary to determine patient infection status.  Positive results do  not rule out bacterial infection or co-infection with other viruses. If result is PRESUMPTIVE POSTIVE SARS-CoV-2 nucleic acids MAY BE PRESENT.   A presumptive positive result was obtained on the submitted specimen  and confirmed on repeat testing.  While 2019 novel coronavirus  (SARS-CoV-2) nucleic acids may be present in the submitted sample  additional confirmatory testing may be necessary for epidemiological  and / or clinical management purposes  to differentiate between  SARS-CoV-2 and other Sarbecovirus currently known to infect humans.  If clinically indicated additional testing with an alternate test  methodology [phone]) is advised. The SARS-CoV-2 RNA is generally  detectable in upper and lower respiratory sp ecimens during the acute  phase of infection. The expected result is Negative. Fact Sheet for Patients:  StrictlyIdeas.no Fact Sheet for Healthcare Providers: BankingDealers.co.za This test is not yet approved or cleared by the [place] FDA and has been authorized for detection and/or diagnosis of SARS-CoV-2 by FDA under an Emergency Use Authorization (EUA).  This EUA will remain in effect (meaning this test can be used) for the duration of the COVID-19 declaration under Section 564(b)(1) of the Act, 21 U.S.C. section 360bbb-3(b)(1), unless the authorization is terminated or  revoked sooner. Performed at [facility], [place] [address], [place], [place] [address]   MRSA PCR Screening     Status: Abnormal   Collection Time: 04/06/19  5:02 PM   Specimen: Nasal Mucosa; Nasopharyngeal  Result Value Ref Range Status   MRSA by PCR POSITIVE (A) NEGATIVE Final    Comment:        The GeneXpert MRSA Assay (FDA approved for NASAL specimens only), is one component of a comprehensive MRSA colonization surveillance program. It is not intended to diagnose MRSA infection nor to guide or monitor treatment for MRSA infections. RESULT CALLED TO, READ BACK BY AND VERIFIED WITH: RN [facility] CVIJETIC [id number] FCP Performed at [facility], [place] [address], [place], [place] [address]       Radiology Studies: Dg Chest Portable 1 View  Result  Date: 04/06/2019 CLINICAL DATA:  Heart failure. EXAM: PORTABLE CHEST 1 VIEW COMPARISON:  03/18/2019 FINDINGS: Normal heart size. Bilateral pleural effusions and pulmonary edema identified compatible with moderate CHF. Diminished aeration to bilateral mid and lower lung zones identified. Upper lobes are relatively clear. IMPRESSION: 1. Moderate CHF. 2. Diminished aeration to bilateral mid and lower lung zones. Electronically Signed   By: Kerby Moors M.D.   On: 04/06/2019 09:32    Scheduled Meds: . ALPRAZolam  1 mg Oral QHS  . amLODipine  10 mg Oral Q breakfast  . arformoterol  15 mcg Nebulization BID  . atorvastatin  40 mg Oral Q supper  . budesonide (PULMICORT) nebulizer solution  0.5 mg Nebulization BID  . carvedilol  25 mg Oral BID WC  . Chlorhexidine Gluconate Cloth  6 each Topical Q0600  . enoxaparin (LOVENOX) injection  40 mg Subcutaneous Q24H  . folic acid  1 mg Oral Daily  . furosemide  40 mg Intravenous BID  . gabapentin  300 mg Oral TID  . insulin aspart  0-9 Units Subcutaneous TID WC  . ipratropium  2 puff Inhalation Once  . lisinopril  40 mg Oral Q breakfast  . LORazepam  0-4 mg Intravenous Q6H    Followed by  . [START ON 04/08/2019] LORazepam  0-4 mg Intravenous Q12H  . magnesium oxide  400 mg Oral Q supper  . multivitamin with minerals  1 tablet Oral Daily  . mupirocin ointment  1 application Nasal BID  . nitroGLYCERIN  0.4 mg Sublingual Once  . pantoprazole  40 mg Oral Daily  . potassium chloride  40 mEq Oral Q4H  . sertraline  150 mg Oral Q breakfast  . sodium chloride flush  3 mL Intravenous Q12H  . spironolactone  25 mg Oral Daily  . thiamine  100 mg Oral Daily   Or  . thiamine  100 mg Intravenous Daily   Continuous Infusions: . sodium chloride       LOS: 1 day   Time spent: 35 minutes   Darliss Cheney, MD [facility]  04/07/2019, 1:25 PM   To contact the attending provider between 7A-7P or the covering provider during after hours 7P-7A, please log into the web site [email or web address] and use password [email or web address].

## 2019-04-07 NOTE — Progress Notes (Signed)
Patient placed on cpap 14cm H2O with 3L of O2 bled in.

## 2019-04-07 NOTE — Consult Note (Signed)
Admit date: 04/06/2019 Referring Physician  Dr. Darliss Cheney Primary Physician  Christain Sacramento MD Primary Cardiologist  Daneen Schick, MD Reason for Consultation  SOB/CHF/CP  HPI: Francis Cardenas is a 68 y.o. male who is being seen today for the evaluation of acute CHF, SOB and CP at the request of Dr. Darliss Cheney.   This is a 68 yo morbidly obese WM with a hx of HTN, HLD, COPD, DM type 2 and prostate CA.  He has a very remote hx of nuclear stress test in the past along with cath in 2005 by Dr. Tamala Julian showing diffuse coronary spasm with no coronary atherosclerosis.    He was recently admitted last month with acute respiratory failure secondary to PNA. After discharge his SOB had improved.  He has OSA and is on CPAP at night. Apparently the night before admission he got up several times to go to the bathroom which is not unusual for him.  The next am he took his CPAP off to get up and became SOB with a sensation that he could not take a deep breath in.  He was also wheezing and has come tightness in his chest and noticed LE edema.  He put his CPAP mask back on but remained SOB.  He has not had any flu like sx.    He proceeded to the ER and was afebrile with WBC 10.7, O2 sats 92% and improved to 98% on NRB at 15L. BNP was elevated at 581 (this was 1091 on last admission). and Cxray showed CHF.  COVID neg.  He was given IV solumedrol, albuterol and Ipratropium and IV Lasix.  He was placed on BiPAP.  Trop was noted to be elevated at [phone].  He put out a total of 1.2L yesterday and is net neg 1.162L.    He tells me that he has had intermittent chest tightness for some time that occurs on a daily basis when he ambulates. He is a poor historian and has a hared time describing his discomfort.  He has DOE as well.  He denies any table salt. He does admit to drinking excessive amounts of ETOH.  He tells me he has breakfast and then drinks beer all morning.  He then has lunch and goes to bed and  basically lays in bed the rest of the day and gets up the next morning.    PMH:   Past Medical History:  Diagnosis Date  . BPH (benign prostatic hyperplasia)   . [facility])    prostate  . COPD (chronic obstructive pulmonary disease) ([place])   . Diabetes mellitus without complication ([place])   . Hypercholesteremia   . Hypertension      PSH:   Past Surgical History:  Procedure Laterality Date  . ACHILLES TENDON REPAIR Left   . KNEE ARTHROSCOPY Left   . TONSILLECTOMY    . WRIST FRACTURE SURGERY Left     Allergies:  Patient has no known allergies. Prior to Admit Meds:   Medications Prior to Admission  Medication Sig Dispense Refill Last Dose  . albuterol (PROVENTIL HFA;VENTOLIN HFA) 108 (90 BASE) MCG/ACT inhaler Inhale 2 puffs into the lungs every 6 (six) hours as needed for wheezing.   04/06/2019 at am  . ALPRAZolam (XANAX) 1 MG tablet Take 1 mg by mouth at bedtime.   04/04/2019 at pm  . amLODipine (NORVASC) 10 MG tablet Take 10 mg by mouth daily with breakfast.    04/05/2019 at am  .  aspirin EC 81 MG tablet Take 81 mg by mouth daily with breakfast.    04/05/2019 at am  . atorvastatin (LIPITOR) 80 MG tablet Take 40 mg by mouth daily with supper.    04/04/2019 at pm  . budesonide-formoterol (SYMBICORT) 80-4.5 MCG/ACT inhaler Inhale 2 puffs into the lungs 2 (two) times daily.   04/05/2019 at Unknown time  . Carboxymethylcellulose Sodium 0.25 % SOLN Place 1 drop into both eyes 4 (four) times daily as needed (dry eyes/ irritation).   week ago  . carvedilol (COREG) 25 MG tablet Take 1 tablet (25 mg total) by mouth 2 (two) times daily with a meal. For hypertension. 60 tablet 0 04/05/2019 at 600  . cholecalciferol (VITAMIN D) 1000 units tablet Take 1,000 Units by mouth daily with supper.    04/04/2019 at am  . diclofenac sodium (VOLTAREN) 1 % GEL Apply 1 application topically 2 (two) times daily as needed (athritis pain).    several weeks ago  . Ensure Max Protein (ENSURE MAX PROTEIN) LIQD  Take 330 mLs (11 oz total) by mouth 2 (two) times daily. (Patient taking differently: Take 11 oz by mouth 2 (two) times daily as needed (meal supplement). ) 330 mL 20 few days ago  . furosemide (LASIX) 40 MG tablet Take 1 tablet (40 mg total) by mouth 2 (two) times daily. (Patient taking differently: Take 40 mg by mouth 2 (two) times daily with a meal. ) 60 tablet 2 04/05/2019 at am  . gabapentin (NEURONTIN) 300 MG capsule Take 300 mg by mouth 3 (three) times daily.   04/05/2019 at midday  . lisinopril (PRINIVIL,ZESTRIL) 40 MG tablet Take 1 tablet (40 mg total) by mouth daily. For hypertension. (Patient taking differently: Take 40 mg by mouth daily with breakfast. For hypertension.) 30 tablet 0 04/05/2019 at am  . magnesium oxide (MAG-OX) 400 MG tablet Take 1 tablet (400 mg total) by mouth daily. (Patient taking differently: Take 400 mg by mouth daily with supper. ) 30 tablet 1 04/04/2019 at pm  . meloxicam (MOBIC) 15 MG tablet Take 15 mg by mouth at bedtime.   04/04/2019 at pm  . metFORMIN (GLUCOPHAGE) 500 MG tablet Take 500 mg by mouth daily with breakfast.   04/05/2019 at am  . Multiple Vitamins-Minerals (MULTIVITAMIN WITH MINERALS) tablet Take 1 tablet by mouth daily with lunch.    04/05/2019 at midday  . omeprazole (PRILOSEC) 40 MG capsule Take 40 mg by mouth daily with supper.    04/04/2019 at pm  . OXcarbazepine (TRILEPTAL) 150 MG tablet Take 300 mg by mouth 3 (three) times daily. Ordered 02/14/19 - [facility] - take 1 tablet (150 mg) by mouth three times daily for 2 weeks, then take 1 1/2 tablets (225 mg) three times daily for 4 weeks, then take 2 tablets (300 mg) daily.   6 weeks ago  . potassium chloride SA (K-DUR,KLOR-CON) 20 MEQ tablet Take 2 tablets (40 mEq total) by mouth daily. (Patient taking differently: Take 40 mEq by mouth daily with supper. ) 60 tablet 1 04/04/2019 at pm  . PRESCRIPTION MEDICATION Inhale into the lungs at bedtime. CPAP   04/05/2019 at Unknown time  . sertraline (ZOLOFT) 100  MG tablet Take one pill by mouth each day for depression and anxiety. (Patient taking differently: Take 150 mg by mouth daily with breakfast. ) 30 tablet 0 04/05/2019 at am  . vitamin B-12 (CYANOCOBALAMIN) 1000 MCG tablet Take 1,000 mcg by mouth daily with supper.    04/04/2019 at pm  .  tiotropium (SPIRIVA HANDIHALER) 18 MCG inhalation capsule Place 1 capsule (18 mcg total) into inhaler and inhale daily. (Patient not taking: Reported on 03/16/2019) 30 capsule 2 Not Taking at Unknown time   Fam HX:    Family History  Problem Relation Age of Onset  . CAD Mother   . Leukemia Mother   . Heart attack Father   . Heart attack Brother    Social HX:    Social History   Socioeconomic History  . Marital status: Married    Spouse name: Not on file  . Number of children: Not on file  . Years of education: Not on file  . Highest education level: Not on file  Occupational History  . Not on file  Social Needs  . Financial resource strain: Not on file  . Food insecurity    Worry: Not on file    Inability: Not on file  . Transportation needs    Medical: Not on file    Non-medical: Not on file  Tobacco Use  . Smoking status: Former Research scientist (life sciences)  . Smokeless tobacco: Never Used  Substance and Sexual Activity  . Alcohol use: Yes    Alcohol/week: 18.0 standard drinks    Types: 18 Cans of beer per week    Comment: 18 cans beer/day  . Drug use: No  . Sexual activity: Not Currently  Lifestyle  . Physical activity    Days per week: Not on file    Minutes per session: Not on file  . Stress: Not on file  Relationships  . Social Herbalist on phone: Not on file    Gets together: Not on file    Attends religious service: Not on file    Active member of club or organization: Not on file    Attends meetings of clubs or organizations: Not on file    Relationship status: Not on file  . Intimate partner violence    Fear of current or ex partner: Not on file    Emotionally abused: Not on file     Physically abused: Not on file    Forced sexual activity: Not on file  Other Topics Concern  . Not on file  Social History Narrative  . Not on file     ROS:  All  ROS were addressed and are negative except what is stated in the HPI  Physical Exam: Blood pressure (!) 167/71, pulse 62, temperature 98.4 F (36.9 C), temperature source Oral, resp. rate 17, height 5\' 8"  (1.727 m), weight 122.9 kg, SpO2 97 %.    General: Well developed, well nourished, in no acute distress Head: Eyes PERRLA, No xanthomas.   Normal cephalic and atramatic  Lungs:   Diffuse wheezing Heart:   HRRR S1 S2 Pulses are 2+ & equal.            No carotid bruit. No JVD.  No abdominal bruits. No femoral bruits. Abdomen: Bowel sounds are positive, abdomen soft and non-tender without masses or                  Hernia's noted. Msk:  Back normal, normal gait. Normal strength and tone for age. Extremities:   No clubbing, cyanosis.  Trace edema.  DP +1 Neuro: Alert and oriented X 3. Psych:  Good affect, responds appropriately    Labs:   Lab Results  Component Value Date   WBC 7.7 04/07/2019   HGB 13.6 04/07/2019   HCT 41.6 04/07/2019  MCV 96.5 04/07/2019   PLT 193 04/07/2019    Recent Labs  Lab 04/07/19 0439  NA 146*  K 3.1*  CL 102  CO2 29  BUN 9  CREATININE 0.78  CALCIUM 10.1  GLUCOSE 135*   No results found for: PTT Lab Results  Component Value Date   INR 1.0 03/16/2019   Lab Results  Component Value Date   TROPONINI 0.11 ([place]) 01/29/2018    No results found for: CHOL No results found for: HDL No results found for: [facility] Lab Results  Component Value Date   TRIG 168 (H) 03/18/2019   TRIG 169 (H) 03/17/2019   TRIG 69 03/16/2019   No results found for: CHOLHDL No results found for: LDLDIRECT    Radiology:  Dg Chest Portable 1 View  Result Date: 04/06/2019 CLINICAL DATA:  Heart failure. EXAM: PORTABLE CHEST 1 VIEW COMPARISON:  03/18/2019 FINDINGS: Normal heart size. Bilateral  pleural effusions and pulmonary edema identified compatible with moderate CHF. Diminished aeration to bilateral mid and lower lung zones identified. Upper lobes are relatively clear. IMPRESSION: 1. Moderate CHF. 2. Diminished aeration to bilateral mid and lower lung zones. Electronically Signed   By: Kerby Moors M.D.   On: 04/06/2019 09:32     Telemetry    NSR - Personally Reviewed  ECG    NSR with IVCD - Personally Reviewed   ASSESSMENT/PLAN:   1.  Acute CHF -2D echo a month ago showed low normal LVF with EF 50% and mild MR along with moderate pulmonary HTN with PASP 39mmHg. -he recently had a URI but SOB improved until the other night -cxray c/e CHF and BNP elevated -he has put out 1.2L yesterday and is net neg 1.16L. -weight is down 5lbs from admit -creatinine stable at 0.78 -will repeat echo to make sure no change in LVF given CP and elevated trop -he drinks a significant amount of beer each morning and we discussed the toxic effects of ETOH on the myocardium and the need to cut back on alcohol -needs aggressive control of BP which is poorly controlled -continue Lasix 40mg  IV BID  2.  Hypokalemia -K+ 3.1 -replete per TRH -check BMET in am -Mag ok  3.  Chest pain with elevated trop -it is hard to get an accurate hx regarding his CP -it clearly is exertional and he is very sedentary -he has noticed decreased exercise tolerance which may be just due to very sedentary lifestyle and spending much of his time in bed -EKG with no acute changes -trop only mildly elevated -may benefit from right and left heart cath -2D echo pending -will make NPO after MN for possible nuclear stress test vs cath in am pending results of echo  4.  Moderate pulmonary HTN -likely multifactorial from OSA and possible obesity hypoventilation syndrome/diastolic CHF with pulmonary venous HTN and COPD. -continue diuresis and O2 as needed  5  HTN -BP borderline controlled -continue amlodipine  10mg  daily, Carvedilol 25mg  BID, Lisinopril 40mg  daily -start spiro 25mg  daily   Fransico Him, MD  04/07/2019  11:25 AM

## 2019-04-08 ENCOUNTER — Inpatient Hospital Stay (HOSPITAL_COMMUNITY): Payer: No Typology Code available for payment source

## 2019-04-08 DIAGNOSIS — I249 Acute ischemic heart disease, unspecified: Secondary | ICD-10-CM

## 2019-04-08 LAB — BASIC METABOLIC PANEL
Anion gap: 11 (ref 5–15)
BUN: 14 mg/dL (ref 8–23)
CO2: 30 mmol/L (ref 22–32)
Calcium: 9.7 mg/dL (ref 8.9–10.3)
Chloride: 100 mmol/L (ref 98–111)
Creatinine, Ser: 0.78 mg/dL (ref 0.61–1.24)
GFR calc Af Amer: >60 mL/min (ref >60)
GFR calc non Af Amer: >60 mL/min (ref >60)
Glucose, Bld: 113 mg/dL — ABNORMAL HIGH (ref 70–99)
Potassium: 2.9 mmol/L — ABNORMAL LOW (ref 3.5–5.1)
Sodium: 141 mmol/L (ref 135–145)

## 2019-04-08 LAB — NM MYOCAR MULTI W/SPECT W/WALL MOTION / EF
Estimated workload: 1 METS
Exercise duration (min): 6 min
Exercise duration (sec): 55 s
LV dias vol: 150 mL (ref 62–150)
LV sys vol: 32 mL
MPHR: 150 {beats}/min
Peak HR: 71 {beats}/min
Percent HR: 47 %
RATE: 0.37
Rest HR: 51 {beats}/min
TID: 1

## 2019-04-08 LAB — GLUCOSE, CAPILLARY
Glucose-Capillary: 116 mg/dL — ABNORMAL HIGH (ref 70–99)
Glucose-Capillary: 110 mg/dL — ABNORMAL HIGH (ref 70–99)
Glucose-Capillary: 116 mg/dL — ABNORMAL HIGH (ref 70–99)
Glucose-Capillary: 133 mg/dL — ABNORMAL HIGH (ref 70–99)
Glucose-Capillary: 90 mg/dL (ref 70–99)

## 2019-04-08 MED ORDER — REGADENOSON 0.4 MG/5ML IV SOLN
INTRAVENOUS | Status: AC
  Filled 2019-04-08: qty 5

## 2019-04-08 MED ORDER — SODIUM CHLORIDE 0.9 % WEIGHT BASED INFUSION
1.0000 mL/kg/h | INTRAVENOUS | Status: DC
  Administered 2019-04-09: 1 mL/kg/h via INTRAVENOUS

## 2019-04-08 MED ORDER — TECHNETIUM TC 99M TETROFOSMIN IV KIT
30.0000 | PACK | Freq: Once | INTRAVENOUS | Status: AC | PRN
  Administered 2019-04-08: 30 via INTRAVENOUS

## 2019-04-08 MED ORDER — SODIUM CHLORIDE 0.9 % WEIGHT BASED INFUSION
3.0000 mL/kg/h | INTRAVENOUS | Status: DC
  Administered 2019-04-09: 3 mL/kg/h via INTRAVENOUS

## 2019-04-08 MED ORDER — POTASSIUM CHLORIDE CRYS ER 20 MEQ PO TBCR
40.0000 meq | EXTENDED_RELEASE_TABLET | ORAL | Status: AC
  Administered 2019-04-08 (×3): 40 meq via ORAL
  Filled 2019-04-08 (×3): qty 2

## 2019-04-08 MED ORDER — TECHNETIUM TC 99M TETROFOSMIN IV KIT
10.0000 | PACK | Freq: Once | INTRAVENOUS | Status: AC | PRN
  Administered 2019-04-08: 10 via INTRAVENOUS

## 2019-04-08 MED ORDER — SODIUM CHLORIDE 0.9% FLUSH: 3.0000 mL | INTRAVENOUS | Status: DC | PRN

## 2019-04-08 MED ORDER — SODIUM CHLORIDE 0.9 % IV SOLN: 250.0000 mL | INTRAVENOUS | Status: DC | PRN

## 2019-04-08 MED ORDER — ASPIRIN 81 MG PO CHEW
81.0000 mg | CHEWABLE_TABLET | ORAL | Status: AC
  Administered 2019-04-09: 81 mg via ORAL
  Filled 2019-04-08: qty 1

## 2019-04-08 MED ORDER — REGADENOSON 0.4 MG/5ML IV SOLN
0.4000 mg | Freq: Once | INTRAVENOUS | Status: AC
  Administered 2019-04-08: 0.4 mg via INTRAVENOUS
  Filled 2019-04-08: qty 5

## 2019-04-08 MED ORDER — SODIUM CHLORIDE 0.9% FLUSH: 3.0000 mL | Freq: Two times a day (BID) | INTRAVENOUS | Status: DC

## 2019-04-08 NOTE — Progress Notes (Signed)
PROGRESS NOTE    Francis Cardenas  [id number] DOB: Mar 23, 1951 DOA: 04/06/2019 PCP: Christain Sacramento, MD  HPI: Francis Cardenas is a 68 y.o. male with medical history significant of HTN, HLD, COPD, DM type II, BPH, and prostate cancer.  He presented with complaints of acute shortness of breath that started at 5:45 AM the morning of presentation to ED. Patient notes that he had been getting up multiple times overnight and he used the restroom as he normally does.  However, when took off his CPAP this morning he reported feeling as though he was unable to take a deep breath in. Associated symptoms of chest tightness, mild wheeze, and some lower extremity swelling.  He tried putting the CPAP mask back, without any relief of symptoms so he came to the ER.  Patient was just recently admitted to the hospital from 10/24-10/29 with the acute respiratory failure with hypoxia and hypercapnia requiring intubation.  He was suspected to have community-acquired pneumonia and treated with 5 days of Rocephin and azithromycin.  At discharge he was able to be sent home without need for supplemental oxygen.  He reports that the symptoms felt similar.   Upon admission into the emergency department patient was noted to be afebrile, heart rate 79-1 21, respirations 15-29, blood pressure 162/89-194/95, and O2 saturation is 92 to 98% initially on a nonrebreather at 15 L.  Labs revealed WBC 10.7 Leukos 207, BNP 581, and high-sensitivity troponin 44.  Chest x-ray showing moderate congestive heart failure with diminished aeration.  COVID-19 screening was negative. Patient had been given 4 puffs of albuterol inhaler, ipratropium, 125 mg of Solu-Medrol IV, 0.4 mg of nitroglycerin sublingual, and 40 mg of Lasix. Patient was placed on a BiPAP mask.  TRH called to admit.  Patient was continued on IV diuresis.  His troponin bumped.  Cardiology was consulted.  He is getting Myoview today.  Assessment & Plan:   Principal Problem:  Acute on chronic systolic CHF (congestive heart failure) (HCC) Active Problems:   Alcohol abuse   COPD (chronic obstructive pulmonary disease) (HCC)   Diabetes mellitus type 2 in obese (HCC)   Acute respiratory failure with hypoxia (HCC)   Morbid obesity ([place])   Hypertensive urgency   Elevated troponin  Acute hypoxic respiratory failure secondary to acute on chronic congestive heart failure with preserved ejection fraction/pulmonary hypertension: Patient's ejection fraction on echo done on 03/17/2019 was 50% which is low normal but not reduced.  Patient's pulmonary artery pressure was 53.  Patient is again doing better today.  He is down to 2 L of nasal cannula oxygen.  His net balance is -2207.  Continue IV diuresis.  Appreciate cardiology help.  Elevated troponin/chest pressure: He also had chest pressure and has elevated troponin.  Cardiology is pursuing Myoview today.  Hypertensive urgency: Blood pressure controlled.  Continue amlodipine, Coreg and lisinopril.  COPD without acute exacerbation: Duo neb as needed.  Continue home medications.  Type 2 diabetes mellitus: Interestingly, his hemoglobin A1c x2 has been only 5.4.  He is hyperglycemic here.  Continue SSI.  Alcohol abuse: No signs of withdrawal.  Continue CIWA protocol with multivitamins.  Morbid obesity: BMI 44.92.  Hypokalemia: 2.9 today.  Will replace with oral potassium chloride.  Recheck in the morning.  OSA on CPAP  DVT prophylaxis: Lovenox Code Status: Full code Family Communication:  None present at bedside.  Plan of care discussed with patient in length and he verbalized understanding and agreed with it. Disposition Plan: Home  when improved and cleared by cardiology.  Estimated body mass index is 38.92 kg/m as calculated from the following:   Height as of this encounter: 5\' 8"  (1.727 m).   Weight as of this encounter: 116.1 kg.      Nutritional status:               Consultants:   Cardiology   Procedures:   None  Antimicrobials:   None   Subjective: Patient seen and examined.  Feels better than yesterday.  No new complaint.  He verified that he drinks approximately 12 to 18 cans of beer every day.  He drinks all day long from morning to night.  He told me he has been drinking that way for about 2 years however he told cardiologist that he was drinking that way for about 5 years.  Objective: Vitals:   04/08/19 1205 04/08/19 1207 04/08/19 1209 04/08/19 1211  BP: (!) 137/53 (!) 147/54 (!) 153/54 (!) 150/57  Pulse:      Resp:      Temp:      TempSrc:      SpO2:      Weight:      Height:        Intake/Output Summary (Last 24 hours) at 04/08/2019 1249 Last data filed at 04/08/2019 1100 Gross per 24 hour  Intake 880 ml  Output 1925 ml  Net -1045 ml   Filed Weights   04/06/19 1631 04/07/19 0458 04/08/19 0500  Weight: 125.3 kg 122.9 kg 116.1 kg    Examination:  General exam: Appears calm and comfortable  Respiratory system: Very faint crackles at bases bilaterally Cardiovascular system: S1 & S2 heard, RRR. No JVD, murmurs, rubs, gallops or clicks.  Trace pitting edema bilateral lower extremity Gastrointestinal system: Abdomen is nondistended, soft and nontender. No organomegaly or masses felt. Normal bowel sounds heard. Central nervous system: Alert and oriented. No focal neurological deficits. Extremities: Symmetric 5 x 5 power. Skin: No rashes, lesions or ulcers.  Psychiatry: Judgement and insight appear normal. Mood & affect appropriate.    Data Reviewed: I have personally reviewed following labs and imaging studies  CBC: Recent Labs  Lab 04/06/19 0813 04/07/19 0439  WBC 10.7* 7.7  NEUTROABS 9.6* 6.8  HGB 14.9 13.6  HCT 46.2 41.6  MCV 99.6 96.5  PLT 200 0000000   Basic Metabolic Panel: Recent Labs  Lab 04/06/19 0813 04/07/19 0439 04/08/19 0601  NA 142 146* 141  K 3.7 3.1* 2.9*  CL 105 102 100  CO2 23 29 30   GLUCOSE 207* 135* 113*  BUN 8 9 14    CREATININE 0.69 0.78 0.78  CALCIUM 9.8 10.1 9.7   GFR: Estimated Creatinine Clearance: 106.3 mL/min (by C-G formula based on SCr of 0.78 mg/dL). Liver Function Tests: No results for input(s): AST, ALT, ALKPHOS, BILITOT, PROT, ALBUMIN in the last 168 hours. No results for input(s): LIPASE, AMYLASE in the last 168 hours. No results for input(s): AMMONIA in the last 168 hours. Coagulation Profile: No results for input(s): INR, PROTIME in the last 168 hours. Cardiac Enzymes: No results for input(s): CKTOTAL, CKMB, CKMBINDEX, TROPONINI in the last 168 hours. BNP (last 3 results) No results for input(s): PROBNP in the last 8760 hours. HbA1C: No results for input(s): HGBA1C in the last 72 hours. CBG: Recent Labs  Lab 04/07/19 1237 04/07/19 1757 04/07/19 2119 04/08/19 0623 04/08/19 0812  GLUCAP 125* 109* 105* 116* 110*   Lipid Profile: No results for input(s): CHOL, HDL, LDLCALC, TRIG,  CHOLHDL, LDLDIRECT in the last 72 hours. Thyroid Function Tests: No results for input(s): TSH, T4TOTAL, FREET4, T3FREE, THYROIDAB in the last 72 hours. Anemia Panel: No results for input(s): VITAMINB12, FOLATE, FERRITIN, TIBC, IRON, RETICCTPCT in the last 72 hours. Sepsis Labs: No results for input(s): PROCALCITON, LATICACIDVEN in the last 168 hours.  Recent Results (from the past 240 hour(s))  SARS Coronavirus 2 by RT PCR (hospital order, performed in [facility] hospital lab) Nasopharyngeal Nasopharyngeal Swab     Status: None   Collection Time: 04/06/19  8:51 AM   Specimen: Nasopharyngeal Swab  Result Value Ref Range Status   SARS Coronavirus 2 NEGATIVE NEGATIVE Final    Comment: (NOTE) If result is NEGATIVE SARS-CoV-2 target nucleic acids are NOT DETECTED. The SARS-CoV-2 RNA is generally detectable in upper and lower  respiratory specimens during the acute phase of infection. The lowest  concentration of SARS-CoV-2 viral copies this assay can detect is 250  copies / mL. A negative result  does not preclude SARS-CoV-2 infection  and should not be used as the sole basis for treatment or other  patient management decisions.  A negative result may occur with  improper specimen collection / handling, submission of specimen other  than nasopharyngeal swab, presence of viral mutation(s) within the  areas targeted by this assay, and inadequate number of viral copies  (<250 copies / mL). A negative result must be combined with clinical  observations, patient history, and epidemiological information. If result is POSITIVE SARS-CoV-2 target nucleic acids are DETECTED. The SARS-CoV-2 RNA is generally detectable in upper and lower  respiratory specimens dur ing the acute phase of infection.  Positive  results are indicative of active infection with SARS-CoV-2.  Clinical  correlation with patient history and other diagnostic information is  necessary to determine patient infection status.  Positive results do  not rule out bacterial infection or co-infection with other viruses. If result is PRESUMPTIVE POSTIVE SARS-CoV-2 nucleic acids MAY BE PRESENT.   A presumptive positive result was obtained on the submitted specimen  and confirmed on repeat testing.  While 2019 novel coronavirus  (SARS-CoV-2) nucleic acids may be present in the submitted sample  additional confirmatory testing may be necessary for epidemiological  and / or clinical management purposes  to differentiate between  SARS-CoV-2 and other Sarbecovirus currently known to infect humans.  If clinically indicated additional testing with an alternate test  methodology [phone]) is advised. The SARS-CoV-2 RNA is generally  detectable in upper and lower respiratory sp ecimens during the acute  phase of infection. The expected result is Negative. Fact Sheet for Patients:  StrictlyIdeas.no Fact Sheet for Healthcare Providers: BankingDealers.co.za This test is not yet approved or  cleared by the [place] FDA and has been authorized for detection and/or diagnosis of SARS-CoV-2 by FDA under an Emergency Use Authorization (EUA).  This EUA will remain in effect (meaning this test can be used) for the duration of the COVID-19 declaration under Section 564(b)(1) of the Act, 21 U.S.C. section 360bbb-3(b)(1), unless the authorization is terminated or revoked sooner. Performed at [facility], [place] [address], [place], [place] [address]   MRSA PCR Screening     Status: Abnormal   Collection Time: 04/06/19  5:02 PM   Specimen: Nasal Mucosa; Nasopharyngeal  Result Value Ref Range Status   MRSA by PCR POSITIVE (A) NEGATIVE Final    Comment:        The GeneXpert MRSA Assay (FDA approved for NASAL specimens only), is  one component of a comprehensive MRSA colonization surveillance program. It is not intended to diagnose MRSA infection nor to guide or monitor treatment for MRSA infections. RESULT CALLED TO, READ BACK BY AND VERIFIED WITH: RN [facility] CVIJETIC [id number] FCP Performed at [facility], [place] [address], [place], [place] [address]       Radiology Studies: No results found.  Scheduled Meds: . ALPRAZolam  1 mg Oral QHS  . amLODipine  10 mg Oral Q breakfast  . arformoterol  15 mcg Nebulization BID  . atorvastatin  40 mg Oral Q supper  . budesonide (PULMICORT) nebulizer solution  0.5 mg Nebulization BID  . carvedilol  25 mg Oral BID WC  . Chlorhexidine Gluconate Cloth  6 each Topical Q0600  . enoxaparin (LOVENOX) injection  40 mg Subcutaneous Q24H  . folic acid  1 mg Oral Daily  . furosemide  40 mg Intravenous BID  . gabapentin  300 mg Oral TID  . insulin aspart  0-5 Units Subcutaneous QHS  . insulin aspart  0-9 Units Subcutaneous TID WC  . ipratropium  2 puff Inhalation Once  . lisinopril  40 mg Oral Q breakfast  . LORazepam  0-4 mg Intravenous Q6H   Followed by  . LORazepam  0-4 mg Intravenous Q12H  . magnesium oxide  400 mg Oral  Q supper  . multivitamin with minerals  1 tablet Oral Daily  . mupirocin ointment  1 application Nasal BID  . nitroGLYCERIN  0.4 mg Sublingual Once  . pantoprazole  40 mg Oral Daily  . potassium chloride  40 mEq Oral Q4H  . regadenoson      . sertraline  150 mg Oral Q breakfast  . sodium chloride flush  3 mL Intravenous Q12H  . spironolactone  25 mg Oral Daily  . thiamine  100 mg Oral Daily   Or  . thiamine  100 mg Intravenous Daily   Continuous Infusions: . sodium chloride       LOS: 2 days   Time spent: 28 minutes minutes   Darliss Cheney, MD [facility]  04/08/2019, 12:49 PM   To contact the attending provider between 7A-7P or the covering provider during after hours 7P-7A, please log into the web site [email or web address] and use password [email or web address].

## 2019-04-08 NOTE — Evaluation (Signed)
Physical Therapy Evaluation Patient Details Name: Francis Cardenas MRN: [id number] DOB: 10-30-1950 Today's Date: 04/08/2019   History of Present Illness   Francis Cardenas is a 68 y.o. male with medical history significant of HTN, HLD, COPD, DM type II, BPH, and prostate cancer.  He presented with complaints of acute shortness of breath that started at 5:45 AM the morning of presentation to ED. Patient notes that he had been getting up multiple times overnight and he used the restroom as he normally does.  However, when took off his CPAP this morning he reported feeling as though he was unable to take a deep breath in. Associated symptoms of chest tightness, mild wheeze, and some lower extremity swelling.  Clinical Impression  Pt admitted with/for SOB due to CHF.  Pt is closing in on his baseline and should be at his normal soon..  Pt currently limited functionally due to the problems listed below.  (see problems list.)  Pt will benefit from PT to maximize function and safety to be able to get home safely with available assist .     Follow Up Recommendations No PT follow up;Supervision - Intermittent    Equipment Recommendations  None recommended by PT    Recommendations for Other Services       Precautions / Restrictions Precautions Precautions: Fall      Mobility  Bed Mobility               General bed mobility comments: OOB on arrival  Transfers Overall transfer level: Needs assistance   Transfers: Sit to/from Stand Sit to Stand: Supervision            Ambulation/Gait Ambulation/Gait assistance: Supervision Gait Distance (Feet): 300 Feet Assistive device: None Gait Pattern/deviations: Step-through pattern   Gait velocity interpretation: 1.31 - 2.62 ft/sec, indicative of limited community ambulator General Gait Details: mildly unsteady at times due to neuropathy, but able to manage minimal challenge, vary gait speed okay with deviation on abrupt turns and backing  up.  Stairs Stairs: Yes Stairs assistance: Min guard Stair Management: Two rails;Step to pattern;Forwards Number of Stairs: 2 General stair comments: struggle powering up.  Wheelchair Mobility    Modified Rankin (Stroke Patients Only)       Balance Overall balance assessment: Mild deficits observed, not formally tested                                           Pertinent Vitals/Pain Pain Assessment: Faces Faces Pain Scale: Hurts a little bit Pain Location: generalizee Pain Intervention(s): Monitored during session    Home Living Family/patient expects to be discharged to:: Private residence Living Arrangements: Spouse/significant other;Other relatives Available Help at Discharge: Family;Available 24 hours/day Type of Home: House Home Access: Stairs to enter Entrance Stairs-Rails: Psychiatric nurse of Steps: 2 Home Layout: One level Home Equipment: Walker - 4 wheels Additional Comments: pt does sink bathing    Prior Function Level of Independence: Independent         Comments: Pt reports his rollator is a bariatric rollator and that it is too large to be functional in his home     Hand Dominance   Dominant Hand: Right    Extremity/Trunk Assessment   Upper Extremity Assessment Upper Extremity Assessment: Overall WFL for tasks assessed    Lower Extremity Assessment Lower Extremity Assessment: Overall WFL for tasks assessed;Generalized weakness(proximal weaknesses)  Communication   Communication: No difficulties  Cognition Arousal/Alertness: Awake/alert Behavior During Therapy: WFL for tasks assessed/performed Overall Cognitive Status: Within Functional Limits for tasks assessed                                        General Comments General comments (skin integrity, edema, etc.): 997% on RA, HR 59 bpm   129/70,     Exercises     Assessment/Plan    PT Assessment Patient needs continued PT  services  PT Problem List Decreased activity tolerance;Decreased balance       PT Treatment Interventions Gait training;Stair training;Functional mobility training;Therapeutic activities;Patient/family education    PT Goals (Current goals can be found in the Care Plan section)  Acute Rehab PT Goals Patient Stated Goal: independent in the house PT Goal Formulation: With patient Time For Goal Achievement: 04/15/19 Potential to Achieve Goals: Good    Frequency Min 3X/week   Barriers to discharge        Co-evaluation               AM-PAC PT "6 Clicks" Mobility  Outcome Measure Help needed turning from your back to your side while in a flat bed without using bedrails?: None Help needed moving from lying on your back to sitting on the side of a flat bed without using bedrails?: None Help needed moving to and from a bed to a chair (including a wheelchair)?: None Help needed standing up from a chair using your arms (e.g., wheelchair or bedside chair)?: None Help needed to walk in hospital room?: None Help needed climbing 3-5 steps with a railing? : A Little 6 Click Score: 23    End of Session   Activity Tolerance: Patient tolerated treatment well Patient left: in chair;with call bell/phone within reach Nurse Communication: Mobility status PT Visit Diagnosis: Unsteadiness on feet (R26.81)    Time: [id number] PT Time Calculation (min) (ACUTE ONLY): 21 min   Charges:   PT Evaluation $PT Eval Low Complexity: 1 Low          04/08/2019  Donnella Sham, PT Acute Rehabilitation Services [phone]  (pager) [phone]  (office)  Francis Cardenas 04/08/2019, 5:40 PM

## 2019-04-08 NOTE — Progress Notes (Signed)
   Lewayne Bunting presented for a nuclear stress test today.  No immediate complications.  Stress imaging is pending at this time.  Preliminary EKG findings may be listed in the chart, but the stress test result will not be finalized until perfusion imaging is complete.  1 day study, [facility] to read.  Rosaria Ferries, PA-C 04/08/2019, 12:19 PM

## 2019-04-08 NOTE — Progress Notes (Signed)
Progress Note  Patient Name: Francis Cardenas Date of Encounter: 04/08/2019  Primary Cardiologist: Tamala Julian   Subjective   68 year old gentleman who was recently admitted with acute congestive heart failure, shortness of breath and chest pain.  Has a history of morbid obesity, hypertension, hyperlipidemia and COPD.  Echocardiogram from approximate month ago revealed a ejection fraction of 50%.  He has mild mitral vegetation and moderate pulmonary hypertension with an estimated PA pressure of 41 mmHg.  Net diuresis of 1.6 L so far during this admission. Troponin levels have been minimally elevated. B natruretic peptide was 1091 on Oct. 24, 2020 .  It is down to 581 as of November 14.  He drinks beer all day long - since retiring 6 years ago Was drinking 18 beers a day ,  Now has cut back to 12 beers a day    Inpatient Medications    Scheduled Meds: . ALPRAZolam  1 mg Oral QHS  . amLODipine  10 mg Oral Q breakfast  . arformoterol  15 mcg Nebulization BID  . atorvastatin  40 mg Oral Q supper  . budesonide (PULMICORT) nebulizer solution  0.5 mg Nebulization BID  . carvedilol  25 mg Oral BID WC  . Chlorhexidine Gluconate Cloth  6 each Topical Q0600  . enoxaparin (LOVENOX) injection  40 mg Subcutaneous Q24H  . folic acid  1 mg Oral Daily  . furosemide  40 mg Intravenous BID  . gabapentin  300 mg Oral TID  . insulin aspart  0-5 Units Subcutaneous QHS  . insulin aspart  0-9 Units Subcutaneous TID WC  . ipratropium  2 puff Inhalation Once  . lisinopril  40 mg Oral Q breakfast  . LORazepam  0-4 mg Intravenous Q6H   Followed by  . LORazepam  0-4 mg Intravenous Q12H  . magnesium oxide  400 mg Oral Q supper  . multivitamin with minerals  1 tablet Oral Daily  . mupirocin ointment  1 application Nasal BID  . nitroGLYCERIN  0.4 mg Sublingual Once  . pantoprazole  40 mg Oral Daily  . potassium chloride  40 mEq Oral Q4H  . sertraline  150 mg Oral Q breakfast  . sodium chloride flush  3  mL Intravenous Q12H  . spironolactone  25 mg Oral Daily  . thiamine  100 mg Oral Daily   Or  . thiamine  100 mg Intravenous Daily   Continuous Infusions: . sodium chloride     PRN Meds: sodium chloride, acetaminophen, diclofenac sodium, ipratropium-albuterol, labetalol, LORazepam **OR** LORazepam, ondansetron (ZOFRAN) IV, polyvinyl alcohol, sodium chloride flush   Vital Signs    Vitals:   04/08/19 0500 04/08/19 0603 04/08/19 0800 04/08/19 0829  BP:  (!) 150/74 (!) 144/67 (!) 148/66  Pulse:  (!) 51 (!) 53 (!) 48  Resp:  16 18 16   Temp:  (!) 97.4 F (36.3 C) 98.3 F (36.8 C) 98 F (36.7 C)  TempSrc:  Oral Oral Oral  SpO2:  100% 99% 99%  Weight: 116.1 kg     Height:        Intake/Output Summary (Last 24 hours) at 04/08/2019 0850 Last data filed at 04/08/2019 0601 Gross per 24 hour  Intake 1160 ml  Output 2050 ml  Net -890 ml   Last 3 Weights 04/08/2019 04/07/2019 04/06/2019  Weight (lbs) 256 lb 271 lb 276 lb 3.8 oz  Weight (kg) 116.121 kg 122.925 kg 125.3 kg  Some encounter information is confidential and restricted. Go to Review Flowsheets activity  to see all data.      Telemetry     NSR  Personally Reviewed  ECG     - Personally Reviewed  Physical Exam   GEN: elderly, moderately obese man. NAD  Neck: No JVD Cardiac: RRR, no murmurs, rubs, or gallops.  Respiratory: Clear to auscultation bilaterally. GI: Soft, nontender, non-distended  MS: No edema; No deformity. Neuro:  Nonfocal  Psych: Normal affect   Labs    High Sensitivity Troponin:   Recent Labs  Lab 03/16/19 0705 03/16/19 0924 04/06/19 0813 04/06/19 1108 04/06/19 1407  TROPONINIHS 33* 125* 44* 136* 183*      Chemistry Recent Labs  Lab 04/06/19 0813 04/07/19 0439 04/08/19 0601  NA 142 146* 141  K 3.7 3.1* 2.9*  CL 105 102 100  CO2 23 29 30   GLUCOSE 207* 135* 113*  BUN 8 9 14   CREATININE 0.69 0.78 0.78  CALCIUM 9.8 10.1 9.7  GFRNONAA >60 >60 >60  GFRAA >60 >60 >60  ANIONGAP  14 15 11      Hematology Recent Labs  Lab 04/06/19 0813 04/07/19 0439  WBC 10.7* 7.7  RBC 4.64 4.31  HGB 14.9 13.6  HCT 46.2 41.6  MCV 99.6 96.5  MCH 32.1 31.6  MCHC 32.3 32.7  RDW 15.0 14.7  PLT 200 193    BNP Recent Labs  Lab 04/06/19 0813  BNP 581.0*     DDimer No results for input(s): DDIMER in the last 168 hours.   Radiology    No results found.  Cardiac Studies      Patient Profile     68 y.o. male with recurrent episodes of respiratory failure   Assessment & Plan    1.   Mild chronic: The patient has a history of mild systolic failure.  He drinks an excessive amount of alcohol-anywhere from 18-12 beers a day for the past 6 years.  He does echocardiogram.  He had recurrent episodes of respiratory failure over the past year.  Has  been hospital 5 times over the past year.   Dr. Radford Pax has questioned whether these episodes of recurrent respiratory failure may be due to coronary ischemia Will get a Lexiscan myoview for further eval  2.  Pulmonary hypertension: Echocardiogram in October revealed an estimated PA pressure of 53 mmHg.  He is a non-smoker although he has a history of smoking in the past. With his excessive alcohol intake, he may be having nighttime hypoxemia. RV appears moderately enlarged with reduced RV function by my review       For questions or updates, please contact [facility] Please consult [email or web address] for contact info under        Signed, Mertie Moores, MD  04/08/2019, 8:50 AM

## 2019-04-08 NOTE — Progress Notes (Signed)
    I have reviewed the myoview and by my reading, the inferolateral defect is reversible Concerning for ischemia This may be contributing to his multiple episodes of respiratory failure over this year    I have discussed cath - rijsk, benefits, options. He understands and agrees to proceed.      Mertie Moores, MD  04/08/2019 6:44 PM    [facility],  [place], [place]  [address] Phone: [phone]; Fax: [phone]

## 2019-04-08 NOTE — Progress Notes (Signed)
Patient left floor for echo at approx 1015, patient returned at approx 1250.  Paged Barrett at 1317, Patient has returned from stress test. ok to eat/diet? Per Barrett callback, ok to eat. Diet orders updated.  Pt. Reported chronic pain in neck due to prior injury to cervical column. Has some dullness and tingling in right thumb, index and middle finger.   Patient reported alcohol use, drinking approx 12 beers per day. Stated has had prior period of sobriety and success with program with clinical program leader that had prior substance abuse.

## 2019-04-08 NOTE — TOC Initial Note (Signed)
Transition of Care [facility]) - Initial/Assessment Note    Patient Details  Name: Francis Cardenas MRN: [id number] Date of Birth: 1950/12/20  Transition of Care [facility]) CM/SW Contact:    [name], RN Phone Number: 04/08/2019, 6:37 PM  Clinical Narrative:                 From home with spouse, for echo stress test today, conts on lasix.  TOC team will continue to follow for TOC needs.  Expected Discharge Plan: Home/Self Care Barriers to Discharge: No Barriers Identified   Patient Goals and CMS Choice        Expected Discharge Plan and Services Expected Discharge Plan: Home/Self Care In-house Referral: NA Discharge Planning Services: CM Consult   Living arrangements for the past 2 months: Single Family Home                 DME Arranged: (NA)         HH Arranged: NA          Prior Living Arrangements/Services Living arrangements for the past 2 months: Single Family Home Lives with:: Spouse Patient language and need for interpreter reviewed:: Yes        Need for Family Participation in Patient Care: Yes (Comment) Care giver support system in place?: Yes (comment)   Criminal Activity/Legal Involvement Pertinent to Current Situation/Hospitalization: No - Comment as needed  Activities of Daily Living Home Assistive Devices/Equipment: Cardenas pressure cuff, CBG Meter, Eyeglasses, Brace (specify type), Hand-held shower hose, Oxygen, Shower chair with back(right wrist brace ) ADL Screening (condition at time of admission) Patient's cognitive ability adequate to safely complete daily activities?: Yes Is the patient deaf or have difficulty hearing?: No Does the patient have difficulty seeing, even when wearing glasses/contacts?: Yes Does the patient have difficulty concentrating, remembering, or making decisions?: Yes Patient able to express need for assistance with ADLs?: Yes Does the patient have difficulty dressing or bathing?: Yes Independently performs ADLs?:  No Communication: Independent Dressing (OT): Needs assistance Is this a change from baseline?: Pre-admission baseline Grooming: Independent Feeding: Independent Bathing: Needs assistance Is this a change from baseline?: Pre-admission baseline Toileting: Independent In/Out Bed: Needs assistance Is this a change from baseline?: Pre-admission baseline Does the patient have difficulty walking or climbing stairs?: Yes Weakness of Legs: Both Weakness of Arms/Hands: Both  Permission Sought/Granted                  Emotional Assessment       Orientation: : Oriented to Self, Oriented to Place, Oriented to  Time Alcohol / Substance Use: Not Applicable Psych Involvement: No (comment)  Admission diagnosis:  Acute respiratory failure with hypoxia ([place]) [J96.01] Acute on chronic heart failure, unspecified heart failure type [facility] [I50.9] Patient Active Problem List   Diagnosis Date Noted  . Hypertensive urgency 04/06/2019  . Elevated troponin 04/06/2019  . Community acquired pneumonia   . COPD with acute exacerbation ([place]) 12/12/2018  . Morbid obesity ([place]) 12/12/2018  . Chronic diastolic CHF (congestive heart failure) ([place]) 12/10/2018  . Fall 12/10/2018  . Multiple rib fractures 12/10/2018  . OSA on CPAP 12/10/2018  . Hypokalemia 12/10/2018  . Acute on chronic systolic CHF (congestive heart failure) ([place])   . Class 2 severe obesity due to excess calories with serious comorbidity and body mass index (BMI) of 37.0 to 37.9 in adult [facility])   . CHF (congestive heart failure) ([place]) 01/28/2018  . Acute CHF (congestive heart failure) ([place]) 12/31/2017  . Acute  respiratory failure with hypoxia ([place]) 12/30/2017  . Chest pain syndrome 12/30/2017  . Carcinoma of prostate ([place]) 08/02/2016  . COPD (chronic obstructive pulmonary disease) ([place]) 08/02/2016  . GERD (gastroesophageal reflux disease) 08/02/2016  . Hyperlipidemia 08/02/2016  . Peripheral neuropathy 08/02/2016  . Steatosis of  liver 08/02/2016  . Diabetes mellitus type 2 in obese ([place]) 08/02/2016  . Benign essential hypertension 06/11/2013  . Alcohol dependence ([place]) 03/26/2013  . PTSD (post-traumatic stress disorder) 03/26/2013  . Alcohol abuse 03/24/2013  . Depressive disorder 03/24/2013   PCP:  Christain Sacramento, MD Pharmacy:   [facility], [address] [id number] [place] [address] Phone: [phone] Fax: [facility] [phone] - [place], [place] - [address] [id number] [place] [id number] Phone: [phone] Fax: [phone]     Social Determinants of Health (SDOH) Interventions    Readmission Risk Interventions Readmission Risk Prevention Plan 03/21/2019 03/21/2019  Transportation Screening - Complete  PCP or Specialist Appt within 3-5 Days - (No Data)  [place] or [place] - Complete  Social Work Consult for [place] Planning/Counseling - Patient refused  Palliative Care Screening - Not Applicable  Medication Review (RN Care Manager) Referral to Pharmacy Complete  Some recent data might be hidden

## 2019-04-09 ENCOUNTER — Encounter (HOSPITAL_COMMUNITY)
Admission: EM | Disposition: A | Discharge status: Home Health | Payer: Self-pay | Source: Home / Self Care | Attending: Family Medicine

## 2019-04-09 ENCOUNTER — Encounter (HOSPITAL_COMMUNITY): Payer: Self-pay | Admitting: Cardiology

## 2019-04-09 DIAGNOSIS — I5041 Acute combined systolic (congestive) and diastolic (congestive) heart failure: Secondary | ICD-10-CM

## 2019-04-09 DIAGNOSIS — J9600 Acute respiratory failure, unspecified whether with hypoxia or hypercapnia: Secondary | ICD-10-CM

## 2019-04-09 DIAGNOSIS — I10 Essential (primary) hypertension: Secondary | ICD-10-CM

## 2019-04-09 HISTORY — PX: RIGHT/LEFT HEART CATH AND CORONARY ANGIOGRAPHY: CATH118266

## 2019-04-09 LAB — POCT I-STAT 7, (LYTES, BLD GAS, ICA,H+H)
Acid-Base Excess: 3 mmol/L — ABNORMAL HIGH (ref 0.0–2.0)
Bicarbonate: 27 mmol/L (ref 20.0–28.0)
Calcium, Ion: 1.28 mmol/L (ref 1.15–1.40)
HCT: 35 % — ABNORMAL LOW (ref 39.0–52.0)
Hemoglobin: 11.9 g/dL — ABNORMAL LOW (ref 13.0–17.0)
O2 Saturation: 93 %
Potassium: 3.7 mmol/L (ref 3.5–5.1)
Sodium: 142 mmol/L (ref 135–145)
TCO2: 28 mmol/L (ref 22–32)
pCO2 arterial: 39.4 mmHg (ref 32.0–48.0)
pH, Arterial: 7.443 (ref 7.350–7.450)
pO2, Arterial: 64 mmHg — ABNORMAL LOW (ref 83.0–108.0)

## 2019-04-09 LAB — CREATININE, SERUM
Creatinine, Ser: 0.71 mg/dL (ref 0.61–1.24)
GFR calc Af Amer: >60 mL/min (ref >60)
GFR calc non Af Amer: >60 mL/min (ref >60)

## 2019-04-09 LAB — BASIC METABOLIC PANEL
Anion gap: 13 (ref 5–15)
BUN: 16 mg/dL (ref 8–23)
CO2: 27 mmol/L (ref 22–32)
Calcium: 10 mg/dL (ref 8.9–10.3)
Chloride: 102 mmol/L (ref 98–111)
Creatinine, Ser: 0.78 mg/dL (ref 0.61–1.24)
GFR calc Af Amer: >60 mL/min (ref >60)
GFR calc non Af Amer: >60 mL/min (ref >60)
Glucose, Bld: 114 mg/dL — ABNORMAL HIGH (ref 70–99)
Potassium: 3.7 mmol/L (ref 3.5–5.1)
Sodium: 142 mmol/L (ref 135–145)

## 2019-04-09 LAB — CBC
HCT: 41 % (ref 39.0–52.0)
Hemoglobin: 13.4 g/dL (ref 13.0–17.0)
MCH: 31.5 pg (ref 26.0–34.0)
MCHC: 32.7 g/dL (ref 30.0–36.0)
MCV: 96.2 fL (ref 80.0–100.0)
Platelets: 148 10*3/uL — ABNORMAL LOW (ref 150–400)
RBC: 4.26 MIL/uL (ref 4.22–5.81)
RDW: 14.6 % (ref 11.5–15.5)
WBC: 7.5 10*3/uL (ref 4.0–10.5)
nRBC: 0 % (ref 0.0–0.2)

## 2019-04-09 LAB — POCT I-STAT EG7
Acid-Base Excess: 3 mmol/L — ABNORMAL HIGH (ref 0.0–2.0)
Bicarbonate: 27.7 mmol/L (ref 20.0–28.0)
Calcium, Ion: 1.31 mmol/L (ref 1.15–1.40)
HCT: 36 % — ABNORMAL LOW (ref 39.0–52.0)
Hemoglobin: 12.2 g/dL — ABNORMAL LOW (ref 13.0–17.0)
O2 Saturation: 69 %
Potassium: 3.7 mmol/L (ref 3.5–5.1)
Sodium: 143 mmol/L (ref 135–145)
TCO2: 29 mmol/L (ref 22–32)
pCO2, Ven: 43.7 mmHg — ABNORMAL LOW (ref 44.0–60.0)
pH, Ven: 7.411 (ref 7.250–7.430)
pO2, Ven: 36 mmHg (ref 32.0–45.0)

## 2019-04-09 LAB — GLUCOSE, CAPILLARY
Glucose-Capillary: 117 mg/dL — ABNORMAL HIGH (ref 70–99)
Glucose-Capillary: 111 mg/dL — ABNORMAL HIGH (ref 70–99)
Glucose-Capillary: 99 mg/dL (ref 70–99)

## 2019-04-09 SURGERY — RIGHT/LEFT HEART CATH AND CORONARY ANGIOGRAPHY
Anesthesia: LOCAL

## 2019-04-09 MED ORDER — HYDRALAZINE HCL 25 MG PO TABS
25.0000 mg | ORAL_TABLET | Freq: Three times a day (TID) | ORAL | Status: DC
  Administered 2019-04-09 – 2019-04-10 (×4): 25 mg via ORAL
  Filled 2019-04-09 (×4): qty 1

## 2019-04-09 MED ORDER — ASPIRIN EC 81 MG PO TBEC
81.0000 mg | DELAYED_RELEASE_TABLET | Freq: Every day | ORAL | Status: DC
  Administered 2019-04-10: 81 mg via ORAL
  Filled 2019-04-09: qty 1

## 2019-04-09 MED ORDER — HEPARIN SODIUM (PORCINE) 1000 UNIT/ML IJ SOLN
INTRAMUSCULAR | Status: DC | PRN
  Administered 2019-04-09: 5500 [IU] via INTRAVENOUS

## 2019-04-09 MED ORDER — LIVING BETTER WITH HEART FAILURE BOOK
Freq: Once | Status: AC
  Administered 2019-04-09: 15:00:00

## 2019-04-09 MED ORDER — VERAPAMIL HCL 2.5 MG/ML IV SOLN
INTRAVENOUS | Status: AC
  Filled 2019-04-09: qty 2

## 2019-04-09 MED ORDER — LIDOCAINE HCL (PF) 1 % IJ SOLN
INTRAMUSCULAR | Status: AC
  Filled 2019-04-09: qty 30

## 2019-04-09 MED ORDER — SODIUM CHLORIDE 0.9% FLUSH: 3.0000 mL | Freq: Two times a day (BID) | INTRAVENOUS | Status: DC

## 2019-04-09 MED ORDER — IOHEXOL 350 MG/ML SOLN
INTRAVENOUS | Status: DC | PRN
  Administered 2019-04-09: 90 mL

## 2019-04-09 MED ORDER — HEPARIN (PORCINE) IN NACL 1000-0.9 UT/500ML-% IV SOLN
INTRAVENOUS | Status: DC | PRN
  Administered 2019-04-09 (×3): 500 mL

## 2019-04-09 MED ORDER — VERAPAMIL HCL 2.5 MG/ML IV SOLN
INTRAVENOUS | Status: DC | PRN
  Administered 2019-04-09: 10 mL via INTRA_ARTERIAL

## 2019-04-09 MED ORDER — HEPARIN SODIUM (PORCINE) 1000 UNIT/ML IJ SOLN
INTRAMUSCULAR | Status: AC
  Filled 2019-04-09: qty 1

## 2019-04-09 MED ORDER — HEPARIN (PORCINE) IN NACL 1000-0.9 UT/500ML-% IV SOLN
INTRAVENOUS | Status: AC
  Filled 2019-04-09: qty 1500

## 2019-04-09 MED ORDER — SODIUM CHLORIDE 0.9% FLUSH: 3.0000 mL | INTRAVENOUS | Status: DC | PRN

## 2019-04-09 MED ORDER — ENOXAPARIN SODIUM 40 MG/0.4ML ~~LOC~~ SOLN: 40.0000 mg | SUBCUTANEOUS | Status: DC

## 2019-04-09 MED ORDER — LIDOCAINE HCL (PF) 1 % IJ SOLN
INTRAMUSCULAR | Status: DC | PRN
  Administered 2019-04-09 (×2): 2 mL

## 2019-04-09 MED ORDER — SODIUM CHLORIDE 0.9 % IV SOLN: 250.0000 mL | INTRAVENOUS | Status: DC | PRN

## 2019-04-09 SURGICAL SUPPLY — 13 items
CATH 5FR JL3.5 JR4 ANG PIG MP (CATHETERS) ×2 IMPLANT
CATH BALLN WEDGE 5F 110CM (CATHETERS) ×2 IMPLANT
DEVICE RAD COMP TR BAND LRG (VASCULAR PRODUCTS) ×2 IMPLANT
GLIDESHEATH SLEND SS 6F .021 (SHEATH) ×2 IMPLANT
GUIDEWIRE .025 260CM (WIRE) ×2 IMPLANT
GUIDEWIRE INQWIRE 1.5J.035X260 (WIRE) ×1 IMPLANT
INQWIRE 1.5J .035X260CM (WIRE) ×2
KIT HEART LEFT (KITS) ×2 IMPLANT
PACK CARDIAC CATHETERIZATION (CUSTOM PROCEDURE TRAY) ×2 IMPLANT
SHEATH GLIDE SLENDER 4/5FR (SHEATH) ×2 IMPLANT
SYR MEDRAD MARK 7 150ML (SYRINGE) ×2 IMPLANT
TRANSDUCER W/STOPCOCK (MISCELLANEOUS) ×2 IMPLANT
TUBING CIL FLEX 10 FLL-RA (TUBING) ×2 IMPLANT

## 2019-04-09 NOTE — Progress Notes (Addendum)
Progress Note  Patient Name: Francis Cardenas Date of Encounter: 04/09/2019  Primary Cardiologist: Fransico Him, MD (Remotely had cath by Dr. Tamala Julian 15 years ago)  Subjective   Overall feeling much better since admission. Still feels a little SOB/tight at times but no overt chest pain. No leg swelling.  Inpatient Medications    Scheduled Meds: . ALPRAZolam  1 mg Oral QHS  . amLODipine  10 mg Oral Q breakfast  . arformoterol  15 mcg Nebulization BID  . [START ON 04/10/2019] aspirin EC  81 mg Oral Daily  . atorvastatin  40 mg Oral Q supper  . budesonide (PULMICORT) nebulizer solution  0.5 mg Nebulization BID  . carvedilol  25 mg Oral BID WC  . Chlorhexidine Gluconate Cloth  6 each Topical Q0600  . enoxaparin (LOVENOX) injection  40 mg Subcutaneous Q24H  . folic acid  1 mg Oral Daily  . furosemide  40 mg Intravenous BID  . gabapentin  300 mg Oral TID  . insulin aspart  0-5 Units Subcutaneous QHS  . insulin aspart  0-9 Units Subcutaneous TID WC  . ipratropium  2 puff Inhalation Once  . lisinopril  40 mg Oral Q breakfast  . Living Better with Heart Failure Book   Does not apply Once  . LORazepam  0-4 mg Intravenous Q12H  . magnesium oxide  400 mg Oral Q supper  . multivitamin with minerals  1 tablet Oral Daily  . mupirocin ointment  1 application Nasal BID  . nitroGLYCERIN  0.4 mg Sublingual Once  . pantoprazole  40 mg Oral Daily  . sertraline  150 mg Oral Q breakfast  . sodium chloride flush  3 mL Intravenous Q12H  . sodium chloride flush  3 mL Intravenous Q12H  . spironolactone  25 mg Oral Daily  . thiamine  100 mg Oral Daily   Or  . thiamine  100 mg Intravenous Daily   Continuous Infusions: . sodium chloride    . sodium chloride    . sodium chloride 1 mL/kg/hr (04/09/19 0557)   PRN Meds: sodium chloride, sodium chloride, acetaminophen, diclofenac sodium, ipratropium-albuterol, labetalol, LORazepam **OR** LORazepam, ondansetron (ZOFRAN) IV, polyvinyl alcohol, sodium  chloride flush, sodium chloride flush   Vital Signs    Vitals:   04/09/19 0643 04/09/19 0726 04/09/19 0742 04/09/19 0745  BP:  (!) 150/68    Pulse:  (!) 59    Resp:  20    Temp:  98.3 F (36.8 C)    TempSrc:  Oral    SpO2:  97% 98% 98%  Weight: 123.4 kg     Height:        Intake/Output Summary (Last 24 hours) at 04/09/2019 0837 Last data filed at 04/09/2019 0600 Gross per 24 hour  Intake 1634.53 ml  Output 1100 ml  Net 534.53 ml   Last 3 Weights 04/09/2019 04/08/2019 04/07/2019  Weight (lbs) 272 lb 256 lb 271 lb  Weight (kg) 123.378 kg 116.121 kg 122.925 kg  Some encounter information is confidential and restricted. Go to Review Flowsheets activity to see all data.     Telemetry    NSR (nocturnal SB) - Personally Reviewed  Physical Exam   GEN: No acute distress, morbidly obese HEENT: Normocephalic, atraumatic, sclera non-icteric. Neck: No JVD or bruits. Cardiac: RRR no murmurs, rubs, or gallops.  Radials/DP/PT 1+ and equal bilaterally.  Respiratory: Diminished BS throughout but no wheezes, rales or rhonchi. Breathing is unlabored. GI: Soft, nontender, non-distended, BS +x 4. MS: no  deformity. Extremities: No clubbing or cyanosis. No edema. Distal pedal pulses are 2+ and equal bilaterally. Neuro:  AAOx3. Follows commands. Psych:  Responds to questions appropriately with a normal affect.  Labs    High Sensitivity Troponin:   Recent Labs  Lab 03/16/19 0705 03/16/19 0924 04/06/19 0813 04/06/19 1108 04/06/19 1407  TROPONINIHS 33* 125* 44* 136* 183*      Cardiac EnzymesNo results for input(s): TROPONINI in the last 168 hours. No results for input(s): TROPIPOC in the last 168 hours.   Chemistry Recent Labs  Lab 04/07/19 0439 04/08/19 0601 04/09/19 0357  NA 146* 141 142  K 3.1* 2.9* 3.7  CL 102 100 102  CO2 29 30 27   GLUCOSE 135* 113* 114*  BUN 9 14 16   CREATININE 0.78 0.78 0.78  CALCIUM 10.1 9.7 10.0  GFRNONAA >60 >60 >60  GFRAA >60 >60 >60   ANIONGAP 15 11 13      Hematology Recent Labs  Lab 04/06/19 0813 04/07/19 0439  WBC 10.7* 7.7  RBC 4.64 4.31  HGB 14.9 13.6  HCT 46.2 41.6  MCV 99.6 96.5  MCH 32.1 31.6  MCHC 32.3 32.7  RDW 15.0 14.7  PLT 200 193    BNP Recent Labs  Lab 04/06/19 0813  BNP 581.0*     DDimer No results for input(s): DDIMER in the last 168 hours.   Radiology    Nm Myocar Multi W/spect W/wall Motion / Ef  Result Date: 04/08/2019  There was no ST segment deviation noted during stress.  The left ventricular ejection fraction is mildly decreased (45-54%).  This is an intermediate risk study.  There is a fixed perfusion defect of moderate severity present in the inferior/inferolateral segments base to apex.  This defect is moderate in size and comprises roughly 15-20% of the LV myocardium.  The wall motion in this region remains normal, and there is significant gut uptake artifact present on the study.  Overall, this likely reflects artifact due to increased radiotracer in the gut, but unable to exclude ischemia in this region.  There is no evidence of reversible ischemia in the other segments. Impression: 1.  Equivocal myocardial perfusion imaging study with significant gut uptake artifact.  Indeterminate for ischemia in the inferior/inferolateral segments as described above. 2.  Mildly dilated left ventricular cavity with mildly reduced ejection fraction, 45%. [place] T. Audie Box, [place] [address] [place], [place] [address] [phone] 4:35 PM   Cardiac Studies   2D echo 03/17/19 IMPRESSIONS  1. Left ventricular ejection fraction, by visual estimation, is 50%. The left ventricle has normal function. Normal left ventricular size. There is no left ventricular hypertrophy.  2. Global right ventricle has normal systolic function.The right ventricular size is normal. No increase in right ventricular wall thickness.  3. Left atrial size was normal.  4. Right  atrial size was normal.  5. The mitral valve is normal in structure. Mild mitral valve regurgitation. No evidence of mitral stenosis.  6. The tricuspid valve is normal in structure. Tricuspid valve regurgitation is trivial.  7. The aortic valve is normal in structure. Aortic valve regurgitation was not visualized by color flow Doppler. Structurally normal aortic valve, with no evidence of sclerosis or stenosis.  8. The pulmonic valve was normal in structure. Pulmonic valve regurgitation is not visualized by color flow Doppler.  9. Moderately elevated pulmonary artery systolic pressure. 10. The inferior vena cava is normal in size with greater than 50% respiratory variability,  suggesting right atrial pressure of 3 mmHg. 11. Left ventricular diastolic Doppler parameters are indeterminate pattern of LV diastolic filling.   Patient Profile     68 y.o. male with morbid obesity, HTN, HLD, COPD, alcohol abuse, former tobacco abuse, DM, prostate CA, OSA on CPAP, remote cath 2005 with coronary spasm. He has been admitted recently with episodes of respiratory failure. Last months' admission was felt due to  PNA. This admission he was admitted with SOB and respiratory failure felt secondary to acute CHF. He also had elevated troponin. 2D Echo 03/17/19 showed EF 50%, normal LV function, indeterminate LV diastolic filling, normal RV, moderately elevated pulmonary pressure. Nuc was equivocal with EF 45%.  Assessment & Plan    1. Acute hypoxic respiratory failure secondary to acute combined CHF with moderate pulmonary HTN  - weights inconsistent in Epic (starting weight 295, recorded at 256 yesterday but 272 today), net -1L. Per yesterday's notes, plan R/LHC. Await cath results to help guide further volume assessment. Risks and benefits of cardiac catheterization have been discussed with the patient. These include bleeding, infection, kidney damage, stroke, heart attack, death. The patient understands these risks  and is willing to proceed. Reviewed 2g sodium restriction, 2L fluid restriction, daily weights with patient. Will rx CHF book and consult dietitian for education as he asked for guidance on dietary changes.  2. Elevated troponin - nuclear stress test was equivocal, raising question of ischemia in the inferior/inferolateral segments as described above. Plan cath today as outlined. He received pre-cath ASA; will start 81mg  daily thereafter for now. Continue BB and statin.  3. OSA - continue CPAP. Monitored remotely by the [facility] per pt report.  4. Essential HTN - remains elevated, will review regimen with MD. Currently on amlodipine, carvedilol, spironolactone, Lasix and lisinopril. Consider renal artery duplex given persistent HTN. Some of elevated BP may be driven by ETOH. Will need close OP f/u of OSA. Will add TSH to AM labs.  5. Hyperlipidemia - continued on atorvastatin 40mg  daily. No recent lipids on file, will order for AM. Recent LFTs 02/2019 were OK.  6. ETOH abuse - counseled on importance of cessation. He is maintained on CIWA.  For questions or updates, please contact [place] Please consult [email or web address] for contact info under Cardiology/STEMI.  Signed, Charlie Pitter, PA-C 04/09/2019, 8:37 AM    Attending Note:   The patient was seen and examined.  Agree with assessment and plan as noted above.  Changes made to the above note as needed.  Patient seen and independently examined with Melina Copa, PA .   We discussed all aspects of the encounter. I agree with the assessment and plan as stated above.  1.   Abnormal stress test.   Equivocal results.   Inf. Lat defect with evidence of reversible ischemia .  For cath today   3.  HTn:   Cont meds. Likely due to heavy etoh use.  Advised cessation    I have spent a total of 40 minutes with patient reviewing hospital  notes , telemetry, EKGs, labs and examining patient as well as establishing an assessment and plan that was discussed  with the patient. > 50% of time was spent in direct patient care.    Thayer Headings, Brooke Bonito., MD, [facility] 04/09/2019, 9:08 AM [address],  [place] Pager [phone]

## 2019-04-09 NOTE — TOC Initial Note (Signed)
Transition of Care [facility]) - Initial/Assessment Note    Patient Details  Name: Francis Cardenas MRN: [id number] Date of Birth: 1950/10/10  Transition of [facility]) CM/SW Contact:    [name], RN Phone Number: 04/09/2019, 4:55 PM  Clinical Narrative:                 Patient from home with spouse, NCM offered choice for [facility], he chose [facility], NCM made referral to [place] with [facility], she is able to take referral.  Soc will begin 24- 48 hrs post dc    Expected Discharge Plan: [place] Barriers to Discharge: No Barriers Identified   Patient Goals and CMS Choice Patient states their goals for this hospitalization and ongoing recovery are:: get better CMS Medicare.gov Compare Post Acute Care list provided to:: Patient Choice offered to / list presented to : Patient  Expected Discharge Plan and Services Expected Discharge Plan: [place] In-house Referral: NA Discharge Planning Services: CM Consult Post Acute Care Choice: [place] arrangements for the past 2 months: Single Family Home                 DME Arranged: (NA)         HH Arranged: RN, Disease Management [place] Agency: [facility] [employer]) Date [place]: 04/09/19 Time [place]: 1654 Representative spoke with at [place]: [place] Arrangements/Services Living arrangements for the past 2 months: [place] Lives with:: Spouse Patient language and need for interpreter reviewed:: Yes Do you feel safe going back to the place where you live?: Yes      Need for Family Participation in Patient Care: No (Comment) Care giver support system in place?: No (comment)   Criminal Activity/Legal Involvement Pertinent to Current Situation/Hospitalization: No - Comment as needed  Activities of Daily Living Home Assistive Devices/Equipment: Blood pressure cuff, CBG Meter, Eyeglasses, Brace (specify type), Hand-held  shower hose, Oxygen, Shower chair with back(right wrist brace ) ADL Screening (condition at time of admission) Patient's cognitive ability adequate to safely complete daily activities?: Yes Is the patient deaf or have difficulty hearing?: No Does the patient have difficulty seeing, even when wearing glasses/contacts?: Yes Does the patient have difficulty concentrating, remembering, or making decisions?: Yes Patient able to express need for assistance with ADLs?: Yes Does the patient have difficulty dressing or bathing?: Yes Independently performs ADLs?: No Communication: Independent Dressing (OT): Needs assistance Is this a change from baseline?: Pre-admission baseline Grooming: Independent Feeding: Independent Bathing: Needs assistance Is this a change from baseline?: Pre-admission baseline Toileting: Independent In/Out Bed: Needs assistance Is this a change from baseline?: Pre-admission baseline Does the patient have difficulty walking or climbing stairs?: Yes Weakness of Legs: Both Weakness of Arms/Hands: Both  Permission Sought/Granted                  Emotional Assessment Appearance:: Appears stated age Attitude/Demeanor/Rapport: Engaged Affect (typically observed): Appropriate Orientation: : Oriented to Self, Oriented to Place, Oriented to  Time, Oriented to Situation Alcohol / Substance Use: Not Applicable Psych Involvement: No (comment)  Admission diagnosis:  Acute respiratory failure with hypoxia ([place]) [J96.01] Acute on chronic heart failure, unspecified heart failure type [facility]) [I50.9] Patient Active Problem List   Diagnosis Date Noted  . Hypertensive urgency 04/06/2019  . Elevated troponin 04/06/2019  . Community acquired pneumonia   . COPD with acute exacerbation ([place]) 12/12/2018  . Morbid obesity ([place])  12/12/2018  . Chronic diastolic CHF (congestive heart failure) ([place]) 12/10/2018  . Fall 12/10/2018  . Multiple rib fractures 12/10/2018  . OSA on CPAP  12/10/2018  . Hypokalemia 12/10/2018  . Acute on chronic systolic CHF (congestive heart failure) ([place])   . Class 2 severe obesity due to excess calories with serious comorbidity and body mass index (BMI) of 37.0 to 37.9 in adult [facility])   . CHF (congestive heart failure) ([place]) 01/28/2018  . Acute CHF (congestive heart failure) ([place]) 12/31/2017  . Acute respiratory failure with hypoxia ([place]) 12/30/2017  . Chest pain syndrome 12/30/2017  . Carcinoma of prostate ([place]) 08/02/2016  . COPD (chronic obstructive pulmonary disease) ([place]) 08/02/2016  . GERD (gastroesophageal reflux disease) 08/02/2016  . Hyperlipidemia 08/02/2016  . Peripheral neuropathy 08/02/2016  . Steatosis of liver 08/02/2016  . Diabetes mellitus type 2 in obese ([place]) 08/02/2016  . Benign essential hypertension 06/11/2013  . Alcohol dependence ([place]) 03/26/2013  . PTSD (post-traumatic stress disorder) 03/26/2013  . Alcohol abuse 03/24/2013  . Depressive disorder 03/24/2013   PCP:  Christain Sacramento, MD Pharmacy:   [facility], [place] [phone] [place] [address] Phone: [phone] Fax: [place] [phone] - [place], [place] - [address] [place] [address] [place] [address] [place] [phone] Phone: [phone] Fax: [phone]     Social Determinants of Health (SDOH) Interventions    Readmission Risk Interventions Readmission Risk Prevention Plan 03/21/2019 03/21/2019  Transportation Screening - Complete  PCP or Specialist Appt within 3-5 Days - (No Data)  [place] or [place] - Complete  Social Work Consult for [place] Planning/Counseling - Patient refused  Palliative Care Screening - Not Applicable  Medication Review (RN Care Manager) Referral to Pharmacy Complete  Some recent data might be hidden

## 2019-04-09 NOTE — Plan of Care (Signed)
  Problem: Activity: Goal: Capacity to carry out activities will improve Outcome: Progressing   Problem: Activity: Goal: Risk for activity intolerance will decrease Outcome: Progressing   Problem: Safety: Goal: Ability to remain free from injury will improve Outcome: Progressing   

## 2019-04-09 NOTE — Progress Notes (Signed)
PROGRESS NOTE    Francis Cardenas  [id number] DOB: 09/18/1950 DOA: 04/06/2019 PCP: Christain Sacramento, MD  HPI: Francis Cardenas is a 68 y.o. male with medical history significant of HTN, HLD, COPD, DM type II, BPH, and prostate cancer.  He presented with complaints of acute shortness of breath that started at 5:45 AM the morning of presentation to ED. Patient notes that he had been getting up multiple times overnight and he used the restroom as he normally does.  However, when took off his CPAP this morning he reported feeling as though he was unable to take a deep breath in. Associated symptoms of chest tightness, mild wheeze, and some lower extremity swelling.  He tried putting the CPAP mask back, without any relief of symptoms so he came to the ER.  Patient was just recently admitted to the hospital from 10/24-10/29 with the acute respiratory failure with hypoxia and hypercapnia requiring intubation.  He was suspected to have community-acquired pneumonia and treated with 5 days of Rocephin and azithromycin.  At discharge he was able to be sent home without need for supplemental oxygen.  He reports that the symptoms felt similar.   Upon admission into the emergency department patient was noted to be afebrile, heart rate 79-1 21, respirations 15-29, blood pressure 162/89-194/95, and O2 saturation is 92 to 98% initially on a nonrebreather at 15 L.  Labs revealed WBC 10.7 Leukos 207, BNP 581, and high-sensitivity troponin 44.  Chest x-ray showing moderate congestive heart failure with diminished aeration.  COVID-19 screening was negative. Patient had been given 4 puffs of albuterol inhaler, ipratropium, 125 mg of Solu-Medrol IV, 0.4 mg of nitroglycerin sublingual, and 40 mg of Lasix. Patient was placed on a BiPAP mask.  TRH called to admit.  Patient was continued on IV diuresis.  His troponin bumped.  Cardiology was consulted.  Myoview showed reversible defect.  Underwent cardiac catheterization with  clean coronaries today.  Assessment & Plan:   Principal Problem:   Acute on chronic systolic CHF (congestive heart failure) (HCC) Active Problems:   Alcohol abuse   COPD (chronic obstructive pulmonary disease) (HCC)   Diabetes mellitus type 2 in obese (HCC)   Acute respiratory failure with hypoxia (HCC)   Morbid obesity ([place])   Hypertensive urgency   Elevated troponin  Acute hypoxic respiratory failure secondary to acute on chronic congestive heart failure with preserved ejection fraction/pulmonary hypertension: Patient's ejection fraction on echo done on 03/17/2019 was 50% which is low normal but not reduced.  Patient's pulmonary artery pressure was 53.  Patient has been off of oxygen this morning however he now complains of worsening shortness of breath.  Continue IV diuresis for another day.  Elevated troponin/chest pressure: He also had chest pressure and has elevated troponin.  Myoview showed reversible perfusion defect.  Underwent cardiac catheterization with clean coronaries.  Monitor overnight and check renal function tomorrow.  Hypertensive urgency: Blood pressure has remained significantly elevated over last 24 hours.  Will continue current dose of amlodipine 10 mg, lisinopril 40 mg p.o. daily and carvedilol 25 mg twice daily and add hydralazine 25 mg 3 times daily and continue as needed IV hydralazine.  COPD without acute exacerbation: Duo neb as needed.  Continue home medications.  Type 2 diabetes mellitus: Interestingly, his hemoglobin A1c x2 has been only 5.4.  He is hyperglycemic here.  Continue SSI.  Alcohol abuse: No signs of withdrawal.  Continue CIWA protocol with multivitamins.  Morbid obesity: BMI 44.92.  Hypokalemia: Resolved.  OSA on CPAP  DVT prophylaxis: Lovenox Code Status: Full code Family Communication:  None present at bedside.  Plan of care discussed with patient in length and he verbalized understanding and agreed with it. Disposition Plan: Patient  is still short of breath and has uncontrolled hypertension.  Needs another day of hospitalization to control that and for him to improve clinically.  Estimated body mass index is 41.36 kg/m as calculated from the following:   Height as of this encounter: 5\' 8"  (1.727 m).   Weight as of this encounter: 123.4 kg.      Nutritional status:               Consultants:   Cardiology  Procedures:   None  Antimicrobials:   None   Subjective: Seen and examined prior to he was being wheeled down for cardiac catheterization.  He was not on oxygen however he complained of shortness of breath.  No other complaint.  Objective: Vitals:   04/09/19 0643 04/09/19 0726 04/09/19 0742 04/09/19 0745  BP:  (!) 150/68    Pulse:  (!) 59    Resp:  20    Temp:  98.3 F (36.8 C)    TempSrc:  Oral    SpO2:  97% 98% 98%  Weight: 123.4 kg     Height:        Intake/Output Summary (Last 24 hours) at 04/09/2019 1004 Last data filed at 04/09/2019 0946 Gross per 24 hour  Intake 1431.53 ml  Output 1375 ml  Net 56.53 ml   Filed Weights   04/07/19 0458 04/08/19 0500 04/09/19 0643  Weight: 122.9 kg 116.1 kg 123.4 kg    Examination:  General exam: Appears calm and comfortable  Respiratory system: Clear to auscultation. Respiratory effort normal. Cardiovascular system: S1 & S2 heard, RRR. No JVD, murmurs, rubs, gallops or clicks. No pedal edema. Gastrointestinal system: Abdomen is nondistended, soft and nontender. No organomegaly or masses felt. Normal bowel sounds heard. Central nervous system: Alert and oriented. No focal neurological deficits. Extremities: Symmetric 5 x 5 power. Skin: No rashes, lesions or ulcers.  Psychiatry: Judgement and insight appear normal. Mood & affect appropriate.    Data Reviewed: I have personally reviewed following labs and imaging studies  CBC: Recent Labs  Lab 04/06/19 0813 04/07/19 0439  WBC 10.7* 7.7  NEUTROABS 9.6* 6.8  HGB 14.9 13.6  HCT  46.2 41.6  MCV 99.6 96.5  PLT 200 0000000   Basic Metabolic Panel: Recent Labs  Lab 04/06/19 0813 04/07/19 0439 04/08/19 0601 04/09/19 0357  NA 142 146* 141 142  K 3.7 3.1* 2.9* 3.7  CL 105 102 100 102  CO2 23 29 30 27   GLUCOSE 207* 135* 113* 114*  BUN 8 9 14 16   CREATININE 0.69 0.78 0.78 0.78  CALCIUM 9.8 10.1 9.7 10.0   GFR: Estimated Creatinine Clearance: 109.9 mL/min (by C-G formula based on SCr of 0.78 mg/dL). Liver Function Tests: No results for input(s): AST, ALT, ALKPHOS, BILITOT, PROT, ALBUMIN in the last 168 hours. No results for input(s): LIPASE, AMYLASE in the last 168 hours. No results for input(s): AMMONIA in the last 168 hours. Coagulation Profile: No results for input(s): INR, PROTIME in the last 168 hours. Cardiac Enzymes: No results for input(s): CKTOTAL, CKMB, CKMBINDEX, TROPONINI in the last 168 hours. BNP (last 3 results) No results for input(s): PROBNP in the last 8760 hours. HbA1C: No results for input(s): HGBA1C in the last 72 hours. CBG: Recent Labs  Lab 04/08/19  [id number] 04/08/19 1309 04/08/19 1648 04/08/19 2055 04/09/19 0626  GLUCAP 110* 116* 133* 90 117*   Lipid Profile: No results for input(s): CHOL, HDL, LDLCALC, TRIG, CHOLHDL, LDLDIRECT in the last 72 hours. Thyroid Function Tests: No results for input(s): TSH, T4TOTAL, FREET4, T3FREE, THYROIDAB in the last 72 hours. Anemia Panel: No results for input(s): VITAMINB12, FOLATE, FERRITIN, TIBC, IRON, RETICCTPCT in the last 72 hours. Sepsis Labs: No results for input(s): PROCALCITON, LATICACIDVEN in the last 168 hours.  Recent Results (from the past 240 hour(s))  SARS Coronavirus 2 by RT PCR (hospital order, performed in [facility] hospital lab) Nasopharyngeal Nasopharyngeal Swab     Status: None   Collection Time: 04/06/19  8:51 AM   Specimen: Nasopharyngeal Swab  Result Value Ref Range Status   SARS Coronavirus 2 NEGATIVE NEGATIVE Final    Comment: (NOTE) If result is NEGATIVE  SARS-CoV-2 target nucleic acids are NOT DETECTED. The SARS-CoV-2 RNA is generally detectable in upper and lower  respiratory specimens during the acute phase of infection. The lowest  concentration of SARS-CoV-2 viral copies this assay can detect is 250  copies / mL. A negative result does not preclude SARS-CoV-2 infection  and should not be used as the sole basis for treatment or other  patient management decisions.  A negative result may occur with  improper specimen collection / handling, submission of specimen other  than nasopharyngeal swab, presence of viral mutation(s) within the  areas targeted by this assay, and inadequate number of viral copies  (<250 copies / mL). A negative result must be combined with clinical  observations, patient history, and epidemiological information. If result is POSITIVE SARS-CoV-2 target nucleic acids are DETECTED. The SARS-CoV-2 RNA is generally detectable in upper and lower  respiratory specimens dur ing the acute phase of infection.  Positive  results are indicative of active infection with SARS-CoV-2.  Clinical  correlation with patient history and other diagnostic information is  necessary to determine patient infection status.  Positive results do  not rule out bacterial infection or co-infection with other viruses. If result is PRESUMPTIVE POSTIVE SARS-CoV-2 nucleic acids MAY BE PRESENT.   A presumptive positive result was obtained on the submitted specimen  and confirmed on repeat testing.  While 2019 novel coronavirus  (SARS-CoV-2) nucleic acids may be present in the submitted sample  additional confirmatory testing may be necessary for epidemiological  and / or clinical management purposes  to differentiate between  SARS-CoV-2 and other Sarbecovirus currently known to infect humans.  If clinically indicated additional testing with an alternate test  methodology [phone]) is advised. The SARS-CoV-2 RNA is generally  detectable in upper  and lower respiratory sp ecimens during the acute  phase of infection. The expected result is Negative. Fact Sheet for Patients:  StrictlyIdeas.no Fact Sheet for Healthcare Providers: BankingDealers.co.za This test is not yet approved or cleared by the [place] FDA and has been authorized for detection and/or diagnosis of SARS-CoV-2 by FDA under an Emergency Use Authorization (EUA).  This EUA will remain in effect (meaning this test can be used) for the duration of the COVID-19 declaration under Section 564(b)(1) of the Act, 21 U.S.C. section 360bbb-3(b)(1), unless the authorization is terminated or revoked sooner. Performed at [facility], [place] [address], [place], [place] [address]   MRSA PCR Screening     Status: Abnormal   Collection Time: 04/06/19  5:02 PM   Specimen: Nasal Mucosa; Nasopharyngeal  Result Value Ref Range Status   MRSA  by PCR POSITIVE (A) NEGATIVE Final    Comment:        The GeneXpert MRSA Assay (FDA approved for NASAL specimens only), is one component of a comprehensive MRSA colonization surveillance program. It is not intended to diagnose MRSA infection nor to guide or monitor treatment for MRSA infections. RESULT CALLED TO, READ BACK BY AND VERIFIED WITH: RN [facility] CVIJETIC [id number] FCP Performed at [facility], [place] [address], [place], [place] [address]       Radiology Studies: Nm Myocar Multi W/spect W/wall Motion / Ef  Result Date: 04/08/2019  There was no ST segment deviation noted during stress.  The left ventricular ejection fraction is mildly decreased (45-54%).  This is an intermediate risk study.  There is a fixed perfusion defect of moderate severity present in the inferior/inferolateral segments base to apex.  This defect is moderate in size and comprises roughly 15-20% of the LV myocardium.  The wall motion in this region remains normal, and there is significant gut  uptake artifact present on the study.  Overall, this likely reflects artifact due to increased radiotracer in the gut, but unable to exclude ischemia in this region.  There is no evidence of reversible ischemia in the other segments. Impression: 1.  Equivocal myocardial perfusion imaging study with significant gut uptake artifact.  Indeterminate for ischemia in the inferior/inferolateral segments as described above. 2.  Mildly dilated left ventricular cavity with mildly reduced ejection fraction, 45%. [facility], [place] [address], [place],  [address] [phone] 4:35 PM   Scheduled Meds: . [MAR Hold] ALPRAZolam  1 mg Oral QHS  . [MAR Hold] amLODipine  10 mg Oral Q breakfast  . [MAR Hold] arformoterol  15 mcg Nebulization BID  . [MAR Hold] aspirin EC  81 mg Oral Daily  . [MAR Hold] atorvastatin  40 mg Oral Q supper  . [MAR Hold] budesonide (PULMICORT) nebulizer solution  0.5 mg Nebulization BID  . [MAR Hold] carvedilol  25 mg Oral BID WC  . [MAR Hold] Chlorhexidine Gluconate Cloth  6 each Topical Q0600  . [MAR Hold] enoxaparin (LOVENOX) injection  40 mg Subcutaneous Q24H  . [MAR Hold] folic acid  1 mg Oral Daily  . [MAR Hold] furosemide  40 mg Intravenous BID  . [MAR Hold] gabapentin  300 mg Oral TID  . [MAR Hold] insulin aspart  0-5 Units Subcutaneous QHS  . [MAR Hold] insulin aspart  0-9 Units Subcutaneous TID WC  . [MAR Hold] ipratropium  2 puff Inhalation Once  . [MAR Hold] lisinopril  40 mg Oral Q breakfast  . [MAR Hold] Living Better with Heart Failure Book   Does not apply Once  . [MAR Hold] LORazepam  0-4 mg Intravenous Q12H  . [MAR Hold] magnesium oxide  400 mg Oral Q supper  . [MAR Hold] multivitamin with minerals  1 tablet Oral Daily  . [MAR Hold] mupirocin ointment  1 application Nasal BID  . [MAR Hold] nitroGLYCERIN  0.4 mg Sublingual Once  . [MAR Hold] pantoprazole  40 mg Oral Daily  . [MAR Hold] sertraline  150 mg Oral Q  breakfast  . [MAR Hold] sodium chloride flush  3 mL Intravenous Q12H  . sodium chloride flush  3 mL Intravenous Q12H  . [MAR Hold] spironolactone  25 mg Oral Daily  . [MAR Hold] thiamine  100 mg Oral Daily   Or  . [MAR Hold] thiamine  100 mg Intravenous  Daily   Continuous Infusions: . [MAR Hold] sodium chloride    . sodium chloride    . sodium chloride 1 mL/kg/hr (04/09/19 0557)     LOS: 3 days   Time spent: 29 minutes   Darliss Cheney, MD [facility]  04/09/2019, 10:04 AM   To contact the attending provider between 7A-7P or the covering provider during after hours 7P-7A, please log into the web site [email or web address] and use password [email or web address].

## 2019-04-09 NOTE — Progress Notes (Signed)
Physical Therapy Treatment Patient Details Name: Francis Cardenas MRN: [id number] DOB: 02-15-1951 Today's Date: 04/09/2019    History of Present Illness  Francis Cardenas is a 68 y.o. male with medical history significant of HTN, HLD, COPD, DM type II, BPH, and prostate cancer.  He presented with complaints of acute shortness of breath that started at 5:45 AM the morning of presentation to ED. Patient notes that he had been getting up multiple times overnight and he used the restroom as he normally does.  However, when took off his CPAP this morning he reported feeling as though he was unable to take a deep breath in. Associated symptoms of chest tightness, mild wheeze, and some lower extremity swelling. Plan for heart cath on 11/17.    PT Comments    Pt tolerated treatment well. Pt demonstrates mild gait and balance deficits, however he reports he is at or near his baseline. Pt with chronic history of falls. PT encourages patient to utilize his cane to mitigate falls risk in the home setting, pt is agreeable. Pt will benefit from continued acute PT services to continue progressing upon balance and gait deficits. Pt declining outpatient PT services at this time.   Follow Up Recommendations  No PT follow up;Supervision - Intermittent     Equipment Recommendations  None recommended by PT    Recommendations for Other Services       Precautions / Restrictions Precautions Precautions: Fall Restrictions Weight Bearing Restrictions: No    Mobility  Bed Mobility Overal bed mobility: Modified Independent             General bed mobility comments: pt requires increased time for sup to sit  Transfers Overall transfer level: Needs assistance Equipment used: None Transfers: Sit to/from Stand Sit to Stand: Supervision            Ambulation/Gait Ambulation/Gait assistance: Supervision Gait Distance (Feet): 600 Feet Assistive device: (intermittent use of railing) Gait  Pattern/deviations: Step-through pattern;Wide base of support;Drifts right/left Gait velocity: functional Gait velocity interpretation: 1.31 - 2.62 ft/sec, indicative of limited community ambulator General Gait Details: pt with increased lateral sway, intermittent mild LOB which pt self-corrects for with stepping strategy. Pt able to reduced gait speed on command but unable to significantly increase gait velocity. Pt able to stop abruptly without LOB and perform head turns without noticeable LOB   Stairs             Wheelchair Mobility    Modified Rankin (Stroke Patients Only)       Balance Overall balance assessment: Mild deficits observed, not formally tested                                          Cognition Arousal/Alertness: Awake/alert Behavior During Therapy: WFL for tasks assessed/performed Overall Cognitive Status: Within Functional Limits for tasks assessed                                        Exercises      General Comments General comments (skin integrity, edema, etc.): Pt sats well on RA, no SOB reports or noted increased work of breathing      Pertinent Vitals/Pain Pain Assessment: No/denies pain    Home Living  Prior Function            PT Goals (current goals can now be found in the care plan section) Acute Rehab PT Goals Patient Stated Goal: independent in the house Progress towards PT goals: Progressing toward goals    Frequency    Min 3X/week      PT Plan Current plan remains appropriate    Co-evaluation              AM-PAC PT "6 Clicks" Mobility   Outcome Measure  Help needed turning from your back to your side while in a flat bed without using bedrails?: None Help needed moving from lying on your back to sitting on the side of a flat bed without using bedrails?: None Help needed moving to and from a bed to a chair (including a wheelchair)?: None Help  needed standing up from a chair using your arms (e.g., wheelchair or bedside chair)?: None Help needed to walk in hospital room?: None Help needed climbing 3-5 steps with a railing? : A Little 6 Click Score: 23    End of Session Equipment Utilized During Treatment: (none) Activity Tolerance: Patient tolerated treatment well Patient left: in chair;with call bell/phone within reach Nurse Communication: Mobility status PT Visit Diagnosis: Unsteadiness on feet (R26.81)     Time: [id number] PT Time Calculation (min) (ACUTE ONLY): 25 min  Charges:  $Gait Training: 23-37 mins                     Zenaida Niece, PT, DPT Acute Rehabilitation Pager: [phone]    Zenaida Niece 04/09/2019, 9:37 AM

## 2019-04-09 NOTE — H&P (View-Only) (Signed)
Progress Note  Patient Name: Francis Cardenas Date of Encounter: 04/09/2019  Primary Cardiologist: Fransico Him, MD (Remotely had cath by Dr. Tamala Julian 15 years ago)  Subjective   Overall feeling much better since admission. Still feels a little SOB/tight at times but no overt chest pain. No leg swelling.  Inpatient Medications    Scheduled Meds: . ALPRAZolam  1 mg Oral QHS  . amLODipine  10 mg Oral Q breakfast  . arformoterol  15 mcg Nebulization BID  . [START ON 04/10/2019] aspirin EC  81 mg Oral Daily  . atorvastatin  40 mg Oral Q supper  . budesonide (PULMICORT) nebulizer solution  0.5 mg Nebulization BID  . carvedilol  25 mg Oral BID WC  . Chlorhexidine Gluconate Cloth  6 each Topical Q0600  . enoxaparin (LOVENOX) injection  40 mg Subcutaneous Q24H  . folic acid  1 mg Oral Daily  . furosemide  40 mg Intravenous BID  . gabapentin  300 mg Oral TID  . insulin aspart  0-5 Units Subcutaneous QHS  . insulin aspart  0-9 Units Subcutaneous TID WC  . ipratropium  2 puff Inhalation Once  . lisinopril  40 mg Oral Q breakfast  . Living Better with Heart Failure Book   Does not apply Once  . LORazepam  0-4 mg Intravenous Q12H  . magnesium oxide  400 mg Oral Q supper  . multivitamin with minerals  1 tablet Oral Daily  . mupirocin ointment  1 application Nasal BID  . nitroGLYCERIN  0.4 mg Sublingual Once  . pantoprazole  40 mg Oral Daily  . sertraline  150 mg Oral Q breakfast  . sodium chloride flush  3 mL Intravenous Q12H  . sodium chloride flush  3 mL Intravenous Q12H  . spironolactone  25 mg Oral Daily  . thiamine  100 mg Oral Daily   Or  . thiamine  100 mg Intravenous Daily   Continuous Infusions: . sodium chloride    . sodium chloride    . sodium chloride 1 mL/kg/hr (04/09/19 0557)   PRN Meds: sodium chloride, sodium chloride, acetaminophen, diclofenac sodium, ipratropium-albuterol, labetalol, LORazepam **OR** LORazepam, ondansetron (ZOFRAN) IV, polyvinyl alcohol, sodium  chloride flush, sodium chloride flush   Vital Signs    Vitals:   04/09/19 0643 04/09/19 0726 04/09/19 0742 04/09/19 0745  BP:  (!) 150/68    Pulse:  (!) 59    Resp:  20    Temp:  98.3 F (36.8 C)    TempSrc:  Oral    SpO2:  97% 98% 98%  Weight: 123.4 kg     Height:        Intake/Output Summary (Last 24 hours) at 04/09/2019 0837 Last data filed at 04/09/2019 0600 Gross per 24 hour  Intake 1634.53 ml  Output 1100 ml  Net 534.53 ml   Last 3 Weights 04/09/2019 04/08/2019 04/07/2019  Weight (lbs) 272 lb 256 lb 271 lb  Weight (kg) 123.378 kg 116.121 kg 122.925 kg  Some encounter information is confidential and restricted. Go to Review Flowsheets activity to see all data.     Telemetry    NSR (nocturnal SB) - Personally Reviewed  Physical Exam   GEN: No acute distress, morbidly obese HEENT: Normocephalic, atraumatic, sclera non-icteric. Neck: No JVD or bruits. Cardiac: RRR no murmurs, rubs, or gallops.  Radials/DP/PT 1+ and equal bilaterally.  Respiratory: Diminished BS throughout but no wheezes, rales or rhonchi. Breathing is unlabored. GI: Soft, nontender, non-distended, BS +x 4. MS: no  deformity. Extremities: No clubbing or cyanosis. No edema. Distal pedal pulses are 2+ and equal bilaterally. Neuro:  AAOx3. Follows commands. Psych:  Responds to questions appropriately with a normal affect.  Labs    High Sensitivity Troponin:   Recent Labs  Lab 03/16/19 0705 03/16/19 0924 04/06/19 0813 04/06/19 1108 04/06/19 1407  TROPONINIHS 33* 125* 44* 136* 183*      Cardiac EnzymesNo results for input(s): TROPONINI in the last 168 hours. No results for input(s): TROPIPOC in the last 168 hours.   Chemistry Recent Labs  Lab 04/07/19 0439 04/08/19 0601 04/09/19 0357  NA 146* 141 142  K 3.1* 2.9* 3.7  CL 102 100 102  CO2 29 30 27   GLUCOSE 135* 113* 114*  BUN 9 14 16   CREATININE 0.78 0.78 0.78  CALCIUM 10.1 9.7 10.0  GFRNONAA >60 >60 >60  GFRAA >60 >60 >60   ANIONGAP 15 11 13      Hematology Recent Labs  Lab 04/06/19 0813 04/07/19 0439  WBC 10.7* 7.7  RBC 4.64 4.31  HGB 14.9 13.6  HCT 46.2 41.6  MCV 99.6 96.5  MCH 32.1 31.6  MCHC 32.3 32.7  RDW 15.0 14.7  PLT 200 193    BNP Recent Labs  Lab 04/06/19 0813  BNP 581.0*     DDimer No results for input(s): DDIMER in the last 168 hours.   Radiology    Nm Myocar Multi W/spect W/wall Motion / Ef  Result Date: 04/08/2019  There was no ST segment deviation noted during stress.  The left ventricular ejection fraction is mildly decreased (45-54%).  This is an intermediate risk study.  There is a fixed perfusion defect of moderate severity present in the inferior/inferolateral segments base to apex.  This defect is moderate in size and comprises roughly 15-20% of the LV myocardium.  The wall motion in this region remains normal, and there is significant gut uptake artifact present on the study.  Overall, this likely reflects artifact due to increased radiotracer in the gut, but unable to exclude ischemia in this region.  There is no evidence of reversible ischemia in the other segments. Impression: 1.  Equivocal myocardial perfusion imaging study with significant gut uptake artifact.  Indeterminate for ischemia in the inferior/inferolateral segments as described above. 2.  Mildly dilated left ventricular cavity with mildly reduced ejection fraction, 45%. [place] T. Audie Box, [place] [address] [place], [place] [address] [phone] 4:35 PM   Cardiac Studies   2D echo 03/17/19 IMPRESSIONS  1. Left ventricular ejection fraction, by visual estimation, is 50%. The left ventricle has normal function. Normal left ventricular size. There is no left ventricular hypertrophy.  2. Global right ventricle has normal systolic function.The right ventricular size is normal. No increase in right ventricular wall thickness.  3. Left atrial size was normal.  4. Right  atrial size was normal.  5. The mitral valve is normal in structure. Mild mitral valve regurgitation. No evidence of mitral stenosis.  6. The tricuspid valve is normal in structure. Tricuspid valve regurgitation is trivial.  7. The aortic valve is normal in structure. Aortic valve regurgitation was not visualized by color flow Doppler. Structurally normal aortic valve, with no evidence of sclerosis or stenosis.  8. The pulmonic valve was normal in structure. Pulmonic valve regurgitation is not visualized by color flow Doppler.  9. Moderately elevated pulmonary artery systolic pressure. 10. The inferior vena cava is normal in size with greater than 50% respiratory variability,  suggesting right atrial pressure of 3 mmHg. 11. Left ventricular diastolic Doppler parameters are indeterminate pattern of LV diastolic filling.   Patient Profile     68 y.o. male with morbid obesity, HTN, HLD, COPD, alcohol abuse, former tobacco abuse, DM, prostate CA, OSA on CPAP, remote cath 2005 with coronary spasm. He has been admitted recently with episodes of respiratory failure. Last months' admission was felt due to  PNA. This admission he was admitted with SOB and respiratory failure felt secondary to acute CHF. He also had elevated troponin. 2D Echo 03/17/19 showed EF 50%, normal LV function, indeterminate LV diastolic filling, normal RV, moderately elevated pulmonary pressure. Nuc was equivocal with EF 45%.  Assessment & Plan    1. Acute hypoxic respiratory failure secondary to acute combined CHF with moderate pulmonary HTN  - weights inconsistent in Epic (starting weight 295, recorded at 256 yesterday but 272 today), net -1L. Per yesterday's notes, plan R/LHC. Await cath results to help guide further volume assessment. Risks and benefits of cardiac catheterization have been discussed with the patient. These include bleeding, infection, kidney damage, stroke, heart attack, death. The patient understands these risks  and is willing to proceed. Reviewed 2g sodium restriction, 2L fluid restriction, daily weights with patient. Will rx CHF book and consult dietitian for education as he asked for guidance on dietary changes.  2. Elevated troponin - nuclear stress test was equivocal, raising question of ischemia in the inferior/inferolateral segments as described above. Plan cath today as outlined. He received pre-cath ASA; will start 81mg  daily thereafter for now. Continue BB and statin.  3. OSA - continue CPAP. Monitored remotely by the [facility] per pt report.  4. Essential HTN - remains elevated, will review regimen with MD. Currently on amlodipine, carvedilol, spironolactone, Lasix and lisinopril. Consider renal artery duplex given persistent HTN. Some of elevated BP may be driven by ETOH. Will need close OP f/u of OSA. Will add TSH to AM labs.  5. Hyperlipidemia - continued on atorvastatin 40mg  daily. No recent lipids on file, will order for AM. Recent LFTs 02/2019 were OK.  6. ETOH abuse - counseled on importance of cessation. He is maintained on CIWA.  For questions or updates, please contact [place] Please consult [email or web address] for contact info under Cardiology/STEMI.  Signed, Charlie Pitter, PA-C 04/09/2019, 8:37 AM    Attending Note:   The patient was seen and examined.  Agree with assessment and plan as noted above.  Changes made to the above note as needed.  Patient seen and independently examined with Melina Copa, PA .   We discussed all aspects of the encounter. I agree with the assessment and plan as stated above.  1.   Abnormal stress test.   Equivocal results.   Inf. Lat defect with evidence of reversible ischemia .  For cath today   3.  HTn:   Cont meds. Likely due to heavy etoh use.  Advised cessation    I have spent a total of 40 minutes with patient reviewing hospital  notes , telemetry, EKGs, labs and examining patient as well as establishing an assessment and plan that was discussed  with the patient. > 50% of time was spent in direct patient care.    Thayer Headings, Brooke Bonito., MD, [facility] 04/09/2019, 9:08 AM [address]  [place] Pager [phone]

## 2019-04-09 NOTE — Progress Notes (Signed)
Initial Nutrition Assessment  DOCUMENTATION CODES:   Obesity unspecified  INTERVENTION:   Provided pt with diet education.   NUTRITION DIAGNOSIS:   Predicted suboptimal nutrient intake related to other (see comment)(ETOH abuse) as evidenced by per patient/family report.  GOAL:   Patient will meet greater than or equal to 90% of their needs  MONITOR:   PO intake, Labs, Weight trends  REASON FOR ASSESSMENT:   Consult Diet education  ASSESSMENT:   68 year old pt admitted with shortness of breath with PMH of hypertension, hyperlipidemia, COPD, type 2 DM, BPH and prostate cancer. Pt found to have CHF exacerbation.  Pt was alert and sitting up in bed. Pt stated he typically eats one meal per day and drinks 12-18 beers per day. He said he frequently skips meals so as to not "kill his buzz". Pt stated he typically eats frozen foods but tries to be aware of the sodium, carbs and sugar. Provided pt education on a low sodium diet to reduce fluid overload. Discussed the importance of reducing alcohol intake to increase food intake and went over aspects of a low sodium diet.   Pt stated normal wt is about 300#. Last charted wt from 12/12/18 is 127.7kg. This is a 3% wt loss which is not significant. Pt eating 100% of meals.  Medications reviewed: folic acid 1mg , lasix 40mg , Novolog, mag-ox 400mg , MVI with minerals, thiamine 100mg   Labs reviewed: glucose 111, Hgb A1C 5.3  NUTRITION - FOCUSED PHYSICAL EXAM:    Most Recent Value  Orbital Region  Mild depletion  Upper Arm Region  Mild depletion  Thoracic and Lumbar Region  Unable to assess  Buccal Region  Moderate depletion  Temple Region  Mild depletion  Clavicle Bone Region  Mild depletion  Clavicle and Acromion Bone Region  Mild depletion  Scapular Bone Region  Mild depletion  Dorsal Hand  No depletion  Patellar Region  Mild depletion  Anterior Thigh Region  No depletion  Posterior Calf Region  Moderate depletion  Edema (RD  Assessment)  Mild  Hair  Reviewed  Eyes  Reviewed  Mouth  Reviewed  Skin  Reviewed  Nails  Reviewed       Diet Order:   Diet Order            Diet Heart Room service appropriate? Yes; Fluid consistency: Thin  Diet effective now              EDUCATION NEEDS:   Education needs have been addressed  Skin:  Skin Assessment: Reviewed RN Assessment  Last BM:  11/16  Height:   Ht Readings from Last 1 Encounters:  04/06/19 5\' 8"  (1.727 m)    Weight:   Wt Readings from Last 1 Encounters:  04/09/19 123.4 kg    Ideal Body Weight:  70 kg  BMI:  Body mass index is 41.36 kg/m.  Estimated Nutritional Needs:   Kcal:  [id number]  Protein:  85-95  Fluid:  1.8-2L    Allen Norris Dietetic Intern Pager # [phone]

## 2019-04-09 NOTE — Progress Notes (Signed)
Patient left floor for cath lab at 1000.  Received report from cath lab Arbie Cookey at 1110; informed Dr [place] completed right and left cath. Patient has pulm HTN that will be treated medically. Patient was SOB upon arrival. LV normal 50-55%, 26 LV EVP (lasix),   Right radial Tband at 1107 with 13cc. Right AC for right heart. BP 168/58 HR 60's. 95% at RA.   Patient returned to floor from cath lab at 1130; RN to bedside. Patient a&ox4. No pain at site. No bleeding. VS assessed.   1710 second RN assist with assessing T band site. Small amount of dried oozing blood observed, will continue to monitor.   Informed night RN of need to closely monitor Tband given need to return air and oozing.

## 2019-04-09 NOTE — Interval H&P Note (Signed)
History and Physical Interval Note:  04/09/2019 10:15 AM  Francis Cardenas  has presented today for surgery, with the diagnosis of NSTEMI.  The various methods of treatment have been discussed with the patient and family. After consideration of risks, benefits and other options for treatment, the patient has consented to  Procedure(s): RIGHT/LEFT HEART CATH AND CORONARY ANGIOGRAPHY (N/A) as a surgical intervention.  The patient's history has been reviewed, patient examined, no change in status, stable for surgery.  I have reviewed the patient's chart and labs.  Questions were answered to the patient's satisfaction.     Collier [facility] 04/09/2019 10:15 AM Cath Lab Visit (complete for each Cath Lab visit)  Clinical Evaluation Leading to the Procedure:   ACS: Yes.    Non-ACS:    Anginal Classification: CCS III  Anti-ischemic medical therapy: Minimal Therapy (1 class of medications)  Non-Invasive Test Results: No non-invasive testing performed  Prior CABG: No previous CABG

## 2019-04-10 ENCOUNTER — Telehealth: Payer: Self-pay | Admitting: Physician Assistant

## 2019-04-10 DIAGNOSIS — I509 Heart failure, unspecified: Secondary | ICD-10-CM

## 2019-04-10 LAB — BASIC METABOLIC PANEL
Anion gap: 9 (ref 5–15)
BUN: 10 mg/dL (ref 8–23)
CO2: 27 mmol/L (ref 22–32)
Calcium: 10 mg/dL (ref 8.9–10.3)
Chloride: 104 mmol/L (ref 98–111)
Creatinine, Ser: 0.73 mg/dL (ref 0.61–1.24)
GFR calc Af Amer: >60 mL/min (ref >60)
GFR calc non Af Amer: >60 mL/min (ref >60)
Glucose, Bld: 114 mg/dL — ABNORMAL HIGH (ref 70–99)
Potassium: 3.5 mmol/L (ref 3.5–5.1)
Sodium: 140 mmol/L (ref 135–145)

## 2019-04-10 LAB — GLUCOSE, CAPILLARY
Glucose-Capillary: 105 mg/dL — ABNORMAL HIGH (ref 70–99)
Glucose-Capillary: 93 mg/dL (ref 70–99)
Glucose-Capillary: 94 mg/dL (ref 70–99)

## 2019-04-10 LAB — LIPID PANEL
Cholesterol: 125 mg/dL (ref 0–200)
HDL: 41 mg/dL (ref >40)
LDL Cholesterol: 63 mg/dL (ref 0–99)
Total CHOL/HDL Ratio: 3 RATIO
Triglycerides: 103 mg/dL (ref <150)
VLDL: 21 mg/dL (ref 0–40)

## 2019-04-10 LAB — TSH: TSH: 3.232 u[IU]/mL (ref 0.350–4.500)

## 2019-04-10 MED ORDER — POTASSIUM CHLORIDE CRYS ER 20 MEQ PO TBCR: 20.0000 meq | EXTENDED_RELEASE_TABLET | Freq: Two times a day (BID) | ORAL | Status: DC

## 2019-04-10 MED ORDER — SPIRONOLACTONE 25 MG PO TABS: 25.0000 mg | ORAL_TABLET | Freq: Every day | ORAL | 0 refills | Status: DC

## 2019-04-10 MED ORDER — TORSEMIDE 20 MG PO TABS: 20.0000 mg | ORAL_TABLET | Freq: Two times a day (BID) | ORAL | 0 refills | Status: DC

## 2019-04-10 MED ORDER — HYDRALAZINE HCL 25 MG PO TABS: 25.0000 mg | ORAL_TABLET | Freq: Three times a day (TID) | ORAL | 0 refills | Status: DC

## 2019-04-10 MED ORDER — POTASSIUM CHLORIDE CRYS ER 20 MEQ PO TBCR
40.0000 meq | EXTENDED_RELEASE_TABLET | Freq: Once | ORAL | Status: AC
  Administered 2019-04-10: 40 meq via ORAL
  Filled 2019-04-10: qty 2

## 2019-04-10 MED ORDER — TORSEMIDE 20 MG PO TABS: 20.0000 mg | ORAL_TABLET | Freq: Two times a day (BID) | ORAL | Status: DC

## 2019-04-10 MED FILL — hydrALAZINE HCL 25 MG TABS: 25 | 30 days supply | Qty: 90 | Fill #0

## 2019-04-10 MED FILL — TORSEMIDE 20 MG TABLET: 20 | 30 days supply | Qty: 60 | Fill #0

## 2019-04-10 MED FILL — SPIRONOLACTONE 25 MG TABLET: 25 | 30 days supply | Qty: 30 | Fill #0

## 2019-04-10 NOTE — Discharge Instructions (Signed)
Heart Failure, Diagnosis  Heart failure is a condition in which the heart has trouble pumping blood because it has become weak or stiff. This means that the heart does not pump blood well enough for the body to stay healthy. For some people with heart failure, fluid may back up into the lungs. There may also be swelling (edema) in the lower legs. Heart failure is usually a long-term (chronic) condition. It is important for you to take good care of yourself and follow the treatment plan from your health care provider. What are the causes? This condition may be caused by:  High blood pressure (hypertension). Hypertension causes the heart muscle to work harder than normal. This makes the heart stiff or weak.  Coronary artery disease, or CAD. CAD is the buildup of cholesterol and fat (plaque) in the arteries of the heart.  Heart attack, also called myocardial infarction. This injures the heart muscle, making it hard for the heart to pump blood.  Abnormal heart valves. The valves do not open and close properly, forcing the heart to pump harder to keep the blood flowing.  Heart muscle disease (cardiomyopathy or myocarditis). This is damage to the heart muscle. It can increase the risk of heart failure.  Lung disease. The heart works harder when the lungs are not healthy.  Abnormal heart rhythms. These can lead to heart failure. What increases the risk? The risk of heart failure increases as a person ages. This condition is also more likely to develop in people who:  Are overweight.  Are male.  Smoke or chew tobacco.  Abuse alcohol or illegal drugs.  Have taken medicines that can damage the heart, such as chemotherapy drugs.  Have diabetes.  Have abnormal heart rhythms.  Have thyroid problems.  Have low blood counts (anemia). What are the signs or symptoms? Symptoms of this condition include:  Shortness of breath with activity, such as when climbing stairs.  A cough that does not  go away.  Swelling of the feet, ankles, legs, or abdomen.  Losing weight for no reason.  Trouble breathing when lying flat (orthopnea).  Waking from sleep because of the need to sit up and get more air.  Rapid heartbeat.  Tiredness (fatigue) and loss of energy.  Feeling light-headed, dizzy, or close to fainting.  Loss of appetite.  Nausea.  Waking up more often during the night to urinate (nocturia).  Confusion. How is this diagnosed? This condition is diagnosed based on:  Your medical history, symptoms, and a physical exam.  Diagnostic tests, which may include: ? Echocardiogram. ? Electrocardiogram (ECG). ? Chest X-ray. ? Blood tests. ? Exercise stress test. ? Radionuclide scans. ? Cardiac catheterization and angiogram. How is this treated? Treatment for this condition is aimed at managing the symptoms of heart failure. Medicines Treatment may include medicines that:  Help lower blood pressure by relaxing (dilating) the blood vessels. These medicines are called ACE inhibitors (angiotensin-converting enzyme) and ARBs (angiotensin receptor blockers).  Cause the kidneys to remove salt and water from the blood through urination (diuretics).  Improve heart muscle strength and prevent the heart from beating too fast (beta blockers).  Increase the force of the heartbeat (digoxin). Healthy behavior changes     Treatment may also include making healthy lifestyle changes, such as:  Reaching and staying at a healthy weight.  Quitting smoking or chewing tobacco.  Eating heart-healthy foods.  Limiting or avoiding alcohol.  Stopping the use of illegal drugs.  Being physically active.  Other treatments  Other treatments may include:  Procedures to open blocked arteries or repair damaged valves.  Placing a pacemaker to improve heart function (cardiac resynchronization therapy).  Placing a device to treat serious abnormal heart rhythms (implantable cardioverter  defibrillator, or ICD).  Placing a device to improve the pumping ability of the heart (left ventricular assist device, or LVAD).  Receiving a healthy heart from a donor (heart transplant). This is done when other treatments have not helped. Follow these instructions at home:  Manage other health conditions as told by your health care provider. These may include hypertension, diabetes, thyroid disease, or abnormal heart rhythms.  Get ongoing education and support as needed. Learn as much as you can about heart failure.  Keep all follow-up visits as told by your health care provider. This is important. Summary  Heart failure is a condition in which the heart has trouble pumping blood because it has become weak or stiff.  This condition is caused by high blood pressure and other diseases of the heart and lungs.  Symptoms of this condition include shortness of breath, tiredness (fatigue), nausea, and swelling of the feet, ankles, legs, or abdomen.  Treatments for this condition may include medicines, lifestyle changes, and surgery.  Manage other health conditions as told by your health care provider. This information is not intended to replace advice given to you by your health care provider. Make sure you discuss any questions you have with your health care provider. Document Released: 05/09/2005 Document Revised: 07/27/2018 Document Reviewed: 07/27/2018 Elsevier Patient Education  [employer].   Heart Failure, Self Care Heart failure is a serious condition. This document explains the things you need to do to take care of yourself after a heart failure diagnosis. You may be asked to change your diet, take certain medicines, and make other lifestyle changes in order to stay as healthy as possible. Your health care provider may also give you more specific instructions. If you have problems or questions, contact your health care provider. What are the risks? Having heart failure puts  you at higher risk for certain problems. These problems can get worse if you do not take good care of yourself. Problems may include:  Blood clotting problems. This may cause a stroke.  Damage to the kidneys, liver, or lungs.  Abnormal heart rhythms. Supplies needed:  Scale for monitoring weight.  Blood pressure monitor.  Notebook.  Medicines. How to care for yourself when you have heart failure Medicines Take over-the-counter and prescription medicines only as told by your health care provider. Medicines reduce the workload of your heart, slow the progression of heart failure, and improve symptoms. Take your medicines every day.  Do not stop taking your medicine unless your health care provider tells you to do so.  Do not skip any dose of medicine.  Refill your prescriptions before you run out of medicine. Eating and drinking   Eat heart-healthy foods. Talk with a dietitian to make an eating plan that is right for you. ? Choose foods that contain no trans fat and are low in saturated fat and cholesterol. Healthy choices include fresh or frozen fruits and vegetables, fish, lean meats, legumes, fat-free or low-fat dairy products, and whole-grain or high-fiber foods. ? Limit salt (sodium) if told by your health care provider. Sodium restriction may reduce symptoms of heart failure. Ask a dietitian to recommend heart-healthy seasonings. ? Use healthy cooking methods instead of frying. Healthy methods include roasting, grilling, broiling, baking, poaching, steaming, and stir-frying.  Limit your fluid intake, if directed by your health care provider. Fluid restriction may reduce symptoms of heart failure. Alcohol use  Do not drink alcohol if: ? Your health care provider tells you not to drink. ? Your heart was damaged by alcohol, or you have severe heart failure. ? You are pregnant, may be pregnant, or are planning to become pregnant.  If you drink alcohol: ? Limit how much you  use to:  0-1 drink a day for women.  0-2 drinks a day for men. ? Be aware of how much alcohol is in your drink. In the [place] one drink equals one 12 oz bottle of beer (355 mL), one 5 oz glass of wine (148 mL), or one 1 oz glass of hard liquor (44 mL). Lifestyle   Do not use any products that contain nicotine or tobacco, such as cigarettes, e-cigarettes, and chewing tobacco. If you need help quitting, ask your health care provider. ? Do not use nicotine gum or patches before talking to your health care provider.  Do not use illegal drugs.  Work with your health care provider to safely reach the right body weight.  Do physical activity if told by your health care provider. Talk to your health care provider before you begin an exercise if: ? You are an older adult. ? You have severe heart failure.  Learn to manage stress. If you need help to do this, ask your health care provider.  Participate in or seek rehabilitation as needed to keep or improve your independence and quality of life.  Plan rest periods when you get tired. Monitoring important information   Weigh yourself every day. This will help you to notice if too much fluid is building up in your body. ? Weigh yourself every morning after you urinate and before you eat breakfast. ? Wear the same amount of clothing each time you weigh yourself. ? Record your daily weight. Provide your health care provider with your weight record.  Monitor and record your pulse and blood pressure as told by your health care provider. Dealing with extreme temperatures  If the weather is extremely hot: ? Avoid vigorous physical activity. ? Use air conditioning or fans, or find a cooler location. ? Avoid caffeine and alcohol. ? Wear loose-fitting, lightweight, and light-colored clothing.  If the weather is extremely cold: ? Avoid vigorous activity. ? Layer your clothes. ? Wear mittens or gloves, a hat, and a scarf when you go  outside. ? Avoid alcohol. Follow these instructions at home:  Stay up to date with vaccines. Pneumococcal and flu (influenza) vaccines are especially important in preventing infections of the airways.  Keep all follow-up visits as told by your health care provider. This is important. Contact a health care provider if you:  Have a rapid weight gain.  Have increasing shortness of breath.  Are unable to participate in your usual physical activities.  Get tired easily.  Cough more than normal, especially with physical activity.  Lose your appetite or feel nauseous.  Have any swelling or more swelling in areas such as your hands, feet, ankles, or abdomen.  Are unable to sleep because it is hard to breathe.  Feel like your heart is beating quickly (palpitations).  Become dizzy or light-headed when you stand up. Get help right away if you:  Have trouble breathing.  Notice or your family notices a change in your awareness, such as having trouble staying awake or concentrating.  Have pain or discomfort in your  chest.  Have an episode of fainting (syncope). These symptoms may represent a serious problem that is an emergency. Do not wait to see if the symptoms will go away. Get medical help right away. Call your local emergency services (911 in the [place] Do not drive yourself to the hospital. Summary  Heart failure is a serious condition. To care for yourself, you may be asked to change your diet, take certain medicines, and make other lifestyle changes.  Take your medicines every day. Do not stop taking them unless your health care provider tells you to do so.  Eat heart-healthy foods, such as fresh or frozen fruits and vegetables, fish, lean meats, legumes, fat-free or low-fat dairy products, and whole-grain or high-fiber foods.  Ask your health care provider if you have any alcohol restrictions. You may have to stop drinking alcohol if you have severe heart failure.  Contact  your health care provider if you notice problems, such as rapid weight gain or a fast heartbeat. Get help right away if you faint, or have chest pain or trouble breathing. This information is not intended to replace advice given to you by your health care provider. Make sure you discuss any questions you have with your health care provider. Document Released: 08/22/2018 Document Revised: 08/21/2018 Document Reviewed: 08/22/2018 Elsevier Patient Education  2020 [employer].

## 2019-04-10 NOTE — Telephone Encounter (Signed)
   FYI TOC team - patient is being discharged from the hospital likely later today and will need f/u phone call. Per chart review, patient was seen by Dr. Radford Pax this admission. He has not seen Dr. Tamala Julian since 2005 so was technically a new patient to Dr. Radford Pax. I asked cardmaster Trish to help assist with making appointment - did not have any care team APP availability but she was able to find appointment with Dr. Radford Pax 12/2. Appt information added to AVS.  Melina Copa PA-C

## 2019-04-10 NOTE — Progress Notes (Addendum)
Progress Note  Patient Name: Francis Cardenas Date of Encounter: 04/10/2019  Primary Cardiologist: Fransico Him, MD (Remotely had cath by Dr. Tamala Julian 15 years ago)  Subjective   Feeling much better overall. Now off O2. Less SOB. Reports that walking with PT yesterday went well. No CP.  Had long discussion about ETOH. Not sure he is ready to commit to quitting - it is engrained in his everyday routine. He declined social work consult to provide community resources for helping abstain.  Inpatient Medications    Scheduled Meds:  ALPRAZolam  1 mg Oral QHS   amLODipine  10 mg Oral Q breakfast   arformoterol  15 mcg Nebulization BID   aspirin EC  81 mg Oral Daily   atorvastatin  40 mg Oral Q supper   budesonide (PULMICORT) nebulizer solution  0.5 mg Nebulization BID   carvedilol  25 mg Oral BID WC   Chlorhexidine Gluconate Cloth  6 each Topical 99991111   folic acid  1 mg Oral Daily   furosemide  40 mg Intravenous BID   gabapentin  300 mg Oral TID   hydrALAZINE  25 mg Oral Q8H   insulin aspart  0-5 Units Subcutaneous QHS   insulin aspart  0-9 Units Subcutaneous TID WC   ipratropium  2 puff Inhalation Once   lisinopril  40 mg Oral Q breakfast   LORazepam  0-4 mg Intravenous Q12H   magnesium oxide  400 mg Oral Q supper   multivitamin with minerals  1 tablet Oral Daily   mupirocin ointment  1 application Nasal BID   nitroGLYCERIN  0.4 mg Sublingual Once   pantoprazole  40 mg Oral Daily   sertraline  150 mg Oral Q breakfast   sodium chloride flush  3 mL Intravenous Q12H   sodium chloride flush  3 mL Intravenous Q12H   spironolactone  25 mg Oral Daily   thiamine  100 mg Oral Daily   Or   thiamine  100 mg Intravenous Daily   Continuous Infusions:  sodium chloride     sodium chloride     PRN Meds: sodium chloride, sodium chloride, acetaminophen, diclofenac sodium, ipratropium-albuterol, labetalol, ondansetron (ZOFRAN) IV, polyvinyl alcohol, sodium  chloride flush, sodium chloride flush   Vital Signs    Vitals:   04/10/19 0103 04/10/19 0530 04/10/19 0739 04/10/19 0758  BP: (!) 146/58 (!) 144/59 (!) 146/58   Pulse: (!) 48 (!) 57 61 66  Resp: 20 20  20   Temp: 97.7 F (36.5 C) 98.2 F (36.8 C) 98 F (36.7 C)   TempSrc: Oral Oral Oral   SpO2: 96% 98% 98% 95%  Weight:  121.7 kg    Height:        Intake/Output Summary (Last 24 hours) at 04/10/2019 0844 Last data filed at 04/10/2019 0500 Gross per 24 hour  Intake 1480 ml  Output 2775 ml  Net -1295 ml   Last 3 Weights 04/10/2019 04/09/2019 04/08/2019  Weight (lbs) 268 lb 6.4 oz 272 lb 256 lb  Weight (kg) 121.745 kg 123.378 kg 116.121 kg  Some encounter information is confidential and restricted. Go to Review Flowsheets activity to see all data.     Telemetry    NSR, rare PVC - Personally Reviewed  Physical Exam   GEN: No acute distress.  HEENT: Normocephalic, atraumatic, sclera non-icteric. Neck: No JVD or bruits. Cardiac: RRR no murmurs, rubs, or gallops.  Radials/DP/PT 1+ and equal bilaterally.  Respiratory: Mildly diminished throughout but no wheezes, rales, or  rhonchi bilaterally. Breathing is unlabored. GI: Soft, nontender, non-distended, BS +x 4. MS: no deformity. Extremities: No clubbing or cyanosis. No significant edema. Distal pedal pulses are 2+ and equal bilaterally. Neuro:  AAOx3. Follows commands. Psych:  Responds to questions appropriately with a normal affect.  Labs    High Sensitivity Troponin:   Recent Labs  Lab 03/16/19 0705 03/16/19 0924 04/06/19 0813 04/06/19 1108 04/06/19 1407  TROPONINIHS 33* 125* 44* 136* 183*      Cardiac EnzymesNo results for input(s): TROPONINI in the last 168 hours. No results for input(s): TROPIPOC in the last 168 hours.   Chemistry Recent Labs  Lab 04/08/19 0601 04/09/19 0357 04/09/19 1038 04/09/19 1047 04/09/19 1204 04/10/19 0541  NA 141 142 142 143  --  140  K 2.9* 3.7 3.7 3.7  --  3.5  CL 100 102   --   --   --  104  CO2 30 27  --   --   --  27  GLUCOSE 113* 114*  --   --   --  114*  BUN 14 16  --   --   --  10  CREATININE 0.78 0.78  --   --  0.71 0.73  CALCIUM 9.7 10.0  --   --   --  10.0  GFRNONAA >60 >60  --   --  >60 >60  GFRAA >60 >60  --   --  >60 >60  ANIONGAP 11 13  --   --   --  9     Hematology Recent Labs  Lab 04/06/19 0813 04/07/19 0439 04/09/19 1038 04/09/19 1047 04/09/19 1204  WBC 10.7* 7.7  --   --  7.5  RBC 4.64 4.31  --   --  4.26  HGB 14.9 13.6 11.9* 12.2* 13.4  HCT 46.2 41.6 35.0* 36.0* 41.0  MCV 99.6 96.5  --   --  96.2  MCH 32.1 31.6  --   --  31.5  MCHC 32.3 32.7  --   --  32.7  RDW 15.0 14.7  --   --  14.6  PLT 200 193  --   --  148*    BNP Recent Labs  Lab 04/06/19 0813  BNP 581.0*     DDimer No results for input(s): DDIMER in the last 168 hours.   Radiology    Nm Myocar Multi W/spect W/wall Motion / Ef  Result Date: 04/08/2019  There was no ST segment deviation noted during stress.  The left ventricular ejection fraction is mildly decreased (45-54%).  This is an intermediate risk study.  There is a fixed perfusion defect of moderate severity present in the inferior/inferolateral segments base to apex.  This defect is moderate in size and comprises roughly 15-20% of the LV myocardium.  The wall motion in this region remains normal, and there is significant gut uptake artifact present on the study.  Overall, this likely reflects artifact due to increased radiotracer in the gut, but unable to exclude ischemia in this region.  There is no evidence of reversible ischemia in the other segments. Impression: 1.  Equivocal myocardial perfusion imaging study with significant gut uptake artifact.  Indeterminate for ischemia in the inferior/inferolateral segments as described above. 2.  Mildly dilated left ventricular cavity with mildly reduced ejection fraction, 45%. [place] [address]. Audie Box, [place] [address], [place]  [place], [place] [address] [phone] 4:35 PM   Cardiac Studies   2D echo  03/17/19 IMPRESSIONS 1. Left ventricular ejection fraction, by visual estimation, is 50%. The left ventricle has normal function. Normal left ventricular size. There is no left ventricular hypertrophy. 2. Global right ventricle has normal systolic function.The right ventricular size is normal. No increase in right ventricular wall thickness. 3. Left atrial size was normal. 4. Right atrial size was normal. 5. The mitral valve is normal in structure. Mild mitral valve regurgitation. No evidence of mitral stenosis. 6. The tricuspid valve is normal in structure. Tricuspid valve regurgitation is trivial. 7. The aortic valve is normal in structure. Aortic valve regurgitation was not visualized by color flow Doppler. Structurally normal aortic valve, with no evidence of sclerosis or stenosis. 8. The pulmonic valve was normal in structure. Pulmonic valve regurgitation is not visualized by color flow Doppler. 9. Moderately elevated pulmonary artery systolic pressure. 10. The inferior vena cava is normal in size with greater than 50% respiratory variability, suggesting right atrial pressure of 3 mmHg. 11. Left ventricular diastolic Doppler parameters are indeterminate pattern of LV diastolic filling  Conclusion    LV end diastolic pressure is moderately elevated.  The left ventricular systolic function is normal.  The left ventricular ejection fraction is 55-65% by visual estimate.  There is trivial (1+) mitral regurgitation.  There is no aortic valve stenosis.  Hemodynamic findings consistent with moderate pulmonary hypertension.   1. Normal coronary anatomy 2. Normal LV systolic function 3. Moderately elevated LV filling pressures. Large V waves to 58 mm Hg c/w LV restriction. No significant MR noted. 4. No AV gradient. 5. Moderate pulmonary HTN 6. Preserved cardiac output. Index 3.     .  Patient  Profile     68 y.o. male  with morbid obesity, HTN, HLD, COPD, alcohol abuse, former tobacco abuse, DM, prostate CA, OSA on CPAP, remote cath 2005 with coronary spasm. He has been admitted recently with episodes of respiratory failure. Last months' admission was felt due to  PNA. This admission he was admitted with SOB and respiratory failure felt secondary to acute CHF. He also had elevated troponin. 2D Echo 03/17/19 showed EF 50%, normal LV function, indeterminate LV diastolic filling, normal RV, moderately elevated pulmonary pressure. Nuc was equivocal with EF 45%. Cath 04/09/19 showed normal coronary arteries and normal LVEF.  Assessment & Plan    1. Acute hypoxic respiratory failure secondary to acute diastolic CHF with moderate pulmonary HTN  - he continues to diurese on IV Lasix. Have reviewed 2g sodium restriction, 2L fluid restriction, daily weights with patient. Dietitian also saw patient. Control of obesity, COPD and OSA will be important with regard to his pulmonary HTN. Suspect this was primarily diastolic CHF - LVEF was normal by cath. Cath showed moderately elevated LV filling pressures. Large V waves to 58 mm Hg c/w LV restriction. Will discuss diuretic plan with Dr. Acie Fredrickson. K now borderline so will supp KCl as well. Will order ambulatory O2 sat check to determine needs for home O2. Dry weight not entirely clear as patient had only recalled being 280 or so in the past.  2. Elevated troponin - nuclear stress was equivocal. Cardiac cath 04/09/19 with normal coronary anatomy. D/c aspirin. Continue BB and statin.  3. OSA - continue CPAP. Monitored remotely by the [facility] per pt report.  4. Essential HTN - remains elevated but improving with continued diuresis and addition of hydralazine. Will discuss titration with MD, contingent on plan for diuretic. Some of elevated BP may be driven by abrupt cessation of ETOH upon  admission to the hospital. Will need close OP f/u of OSA as well.  5.  Hyperlipidemia - continued on atorvastatin 40mg  daily. Lipids look great this admission.  6. ETOH abuse - counseled on importance of cessation. He is maintained on CIWA.  For questions or updates, please contact [facility] Please consult [email or web address] for contact info under Cardiology/STEMI.  Signed, Charlie Pitter, PA-C 04/10/2019, 8:44 AM     Attending Note:   The patient was seen and examined.  Agree with assessment and plan as noted above.  Changes made to the above note as needed.  Patient seen and independently examined with  Melina Copa, PA .   We discussed all aspects of the encounter. I agree with the assessment and plan as stated above.  1.     Acute respiratory failure:   Had no significant CAD at cath .   Likely has diastolic dysfunction ( at echo the diastolic function was indeterminate) .   Also has moderate pulmonary HTN with elevated LV EDP Has been taking lasix 40 BID at home.   The excess volume overload may be due to inadequate diuresis or may be due to his excessive ETOH intake.    Ive recommended that he  stop his ETOH intake ( was drinking 12-18 beers a day for 6 years )  We could consider changing the furosemide to torsemide 20 mg PO BID at discharge which may  offer a better diuresis  He can follow up with Dr. Radford Pax / APP in several weeks.  Suggest having him ambulate in the halls. OK for him to be discharged once he is stable from that point .     I have spent a total of 40 minutes with patient reviewing hospital  notes , telemetry, EKGs, labs and examining patient as well as establishing an assessment and plan that was discussed with the patient. > 50% of time was spent in direct patient care.    Thayer Headings, Brooke Bonito., MD, [facility], [facility] 04/10/2019, 10:07 AM [address]  [place] Pager [phone]

## 2019-04-10 NOTE — Progress Notes (Signed)
Pt received discharge orders. Completed AVS paperwork. Reconciled patient medications and explained when to take them. Provided education on heart failure and radial catheter site care. Pt had no outstanding questions. Removed him from telemetry and removed an intact IV from the left forearm.

## 2019-04-10 NOTE — Discharge Summary (Signed)
Physician Discharge Summary  Francis Cardenas XBL:[id number] DOB: [birth year] DOA: 04/06/2019  PCP: Christain Sacramento, MD  Admit date: 04/06/2019 Discharge date: 04/10/2019  Admitted From: Home Disposition:  Home  Recommendations for Outpatient Follow-up:  1. Follow up with PCP as scheduled 2. Follow up with Cardiology as scheduled  Discharge Condition:Stable CODE STATUS:Full Diet recommendation: Diabetic, Heart healthy   Brief/Interim Summary: 68 y.o.malewith medical history significant ofHTN, HLD, COPD, DM type II, BPH,andprostate cancer.He presented with complaints of acute shortness of breath that started at 5:45 AM the morning of presentation to ED.Patient notes that he had been getting up multiple times overnight and he used the restroom as he normally does. However, when took off his CPAP this morning he reported feeling as though he was unable to take a deep breath in. Associated symptoms of chest tightness, mild wheeze, and some lower extremity swelling. He tried putting the CPAP mask back, without any relief of symptoms so he came to the ER.  Patient was just recently admitted to the hospital from 10/24-10/29 with the acute respiratory failure with hypoxia and hypercapnia requiring intubation.He was suspected to have community-acquired pneumonia and treated with 5 days of Rocephin and azithromycin. At discharge he was able to be sent home without need for supplemental oxygen. He reports that the symptoms felt similar.   Upon admission into the emergency department patient was noted to be afebrile, heart rate 79-1 21, respirations 15-29, blood pressure 162/89-194/95, and O2 saturation is 92 to 98% initially on a nonrebreather at 15 L. Labs revealed WBC 10.7 Leukos 207, BNP 581, and high-sensitivity troponin 44.Chest x-ray showing moderate congestive heart failure with diminished aeration. COVID-19 screening was negative. Patient had been given 4 puffs of albuterol  inhaler, ipratropium, 125 mg of Solu-Medrol IV, 0.4 mg of nitroglycerin sublingual, and 40 mg of Lasix. Patient was placed on a BiPAP mask.TRH called to admit.  Patient was continued on IV diuresis.  His troponin bumped.  Cardiology was consulted.  Myoview showed reversible defect.  Underwent cardiac catheterization with clean coronaries  Discharge Diagnoses:  Principal Problem:   Acute on chronic systolic CHF (congestive heart failure) (HCC) Active Problems:   Alcohol abuse   COPD (chronic obstructive pulmonary disease) (HCC)   Diabetes mellitus type 2 in obese (HCC)   Acute respiratory failure with hypoxia (HCC)   Morbid obesity ([place])   Hypertensive urgency   Elevated troponin   Acute hypoxic respiratory failure secondary to acute on chronic congestive heart failure with preserved ejection fraction/pulmonary hypertension:  -Patient's ejection fraction on echo done on 03/17/2019 was 50% which is low normal but not reduced.   -Patient's pulmonary artery pressure was 53.  -Cardiology following. Recommendation to transition to torsemide at time of d/c  Elevated troponin/chest pressure:  -He also had chest pressure and has elevated troponin.   -Myoview showed reversible perfusion defect.   -Underwent cardiac catheterization with clean coronaries  Hypertensive urgency:  -Blood pressure has remained significantly elevated recently.   -continue current dose of amlodipine 10 mg, lisinopril 40 mg p.o. daily and carvedilol 25 mg twice daily and add hydralazine 25 mg 3 times daily -BP now stable  COPD without acute exacerbation:  -Continued with Duo neb as needed.  Continue home medications.  Type 2 diabetes mellitus:  -Noted to have a hemoglobin A1 of 5.4.  He is hyperglycemic here.   -Continued SSI while in hospital  Alcohol abuse:  -No signs of withdrawal.   -Cessation done at bedside by multiple providers.  Pt voices understanding of need to quit  Morbid obesity:  -BMI  44.92.  Hypokalemia:  -Replaced   OSA on CPAP  Discharge Instructions   Allergies as of 04/10/2019   No Known Allergies     Medication List    STOP taking these medications   furosemide 40 MG tablet Commonly known as: LASIX     TAKE these medications   albuterol 108 (90 Base) MCG/ACT inhaler Commonly known as: VENTOLIN HFA Inhale 2 puffs into the lungs every 6 (six) hours as needed for wheezing.   ALPRAZolam 1 MG tablet Commonly known as: XANAX Take 1 mg by mouth at bedtime.   amLODipine 10 MG tablet Commonly known as: NORVASC Take 10 mg by mouth daily with breakfast.   aspirin EC 81 MG tablet Take 81 mg by mouth daily with breakfast.   atorvastatin 80 MG tablet Commonly known as: LIPITOR Take 40 mg by mouth daily with supper.   budesonide-formoterol 80-4.5 MCG/ACT inhaler Commonly known as: SYMBICORT Inhale 2 puffs into the lungs 2 (two) times daily.   Carboxymethylcellulose Sodium 0.25 % Soln Place 1 drop into both eyes 4 (four) times daily as needed (dry eyes/ irritation).   carvedilol 25 MG tablet Commonly known as: COREG Take 1 tablet (25 mg total) by mouth 2 (two) times daily with a meal. For hypertension.   cholecalciferol 1000 units tablet Commonly known as: VITAMIN D Take 1,000 Units by mouth daily with supper.   diclofenac sodium 1 % Gel Commonly known as: VOLTAREN Apply 1 application topically 2 (two) times daily as needed (athritis pain).   Ensure Max Protein Liqd Take 330 mLs (11 oz total) by mouth 2 (two) times daily. What changed:   when to take this  reasons to take this   gabapentin 300 MG capsule Commonly known as: NEURONTIN Take 300 mg by mouth 3 (three) times daily.   hydrALAZINE 25 MG tablet Commonly known as: APRESOLINE Take 1 tablet (25 mg total) by mouth every 8 (eight) hours.   lisinopril 40 MG tablet Commonly known as: ZESTRIL Take 1 tablet (40 mg total) by mouth daily. For hypertension. What changed: when to  take this   magnesium oxide 400 MG tablet Commonly known as: MAG-OX Take 1 tablet (400 mg total) by mouth daily. What changed: when to take this   meloxicam 15 MG tablet Commonly known as: MOBIC Take 15 mg by mouth at bedtime.   metFORMIN 500 MG tablet Commonly known as: GLUCOPHAGE Take 500 mg by mouth daily with breakfast.   multivitamin with minerals tablet Take 1 tablet by mouth daily with lunch.   omeprazole 40 MG capsule Commonly known as: PRILOSEC Take 40 mg by mouth daily with supper.   OXcarbazepine 150 MG tablet Commonly known as: TRILEPTAL Take 300 mg by mouth 3 (three) times daily. Ordered 02/14/19 - [facility] - take 1 tablet (150 mg) by mouth three times daily for 2 weeks, then take 1 1/2 tablets (225 mg) three times daily for 4 weeks, then take 2 tablets (300 mg) daily.   potassium chloride SA 20 MEQ tablet Commonly known as: KLOR-CON Take 2 tablets (40 mEq total) by mouth daily. What changed: when to take this   Eddystone into the lungs at bedtime. CPAP   sertraline 100 MG tablet Commonly known as: ZOLOFT Take one pill by mouth each day for depression and anxiety. What changed:   how much to take  how to take this  when to take this  additional instructions   Spiriva HandiHaler 18 MCG inhalation capsule Generic drug: tiotropium Place 1 capsule (18 mcg total) into inhaler and inhale daily.   spironolactone 25 MG tablet Commonly known as: ALDACTONE Take 1 tablet (25 mg total) by mouth daily. Start taking on: April 11, 2019   torsemide 20 MG tablet Commonly known as: DEMADEX Take 1 tablet (20 mg total) by mouth 2 (two) times daily.   vitamin B-12 1000 MCG tablet Commonly known as: CYANOCOBALAMIN Take 1,000 mcg by mouth daily with supper.      Follow-up Information    [facility] Follow up.   Specialty: Home Health Services Why: [place] information: [place]  [id number] [phone]        Sueanne Margarita, MD Follow up.   Specialty: Cardiology Why: [facility] - see appointment information below on 04/24/19. Dr. Radford Pax is one of the cardiologists you met in the hospital. Contact information: [address] [place] [address] [phone]        Christain Sacramento, MD. Schedule an appointment as soon as possible for a visit.   Specialty: Family Medicine Contact information: [place] [address] [phone]          No Known Allergies  Consultations:  Cardiology  Procedures/Studies: Nm Myocar Multi W/spect W/wall Motion / Ef  Result Date: 04/08/2019  There was no ST segment deviation noted during stress.  The left ventricular ejection fraction is mildly decreased (45-54%).  This is an intermediate risk study.  There is a fixed perfusion defect of moderate severity present in the inferior/inferolateral segments base to apex.  This defect is moderate in size and comprises roughly 15-20% of the LV myocardium.  The wall motion in this region remains normal, and there is significant gut uptake artifact present on the study.  Overall, this likely reflects artifact due to increased radiotracer in the gut, but unable to exclude ischemia in this region.  There is no evidence of reversible ischemia in the other segments. Impression: 1.  Equivocal myocardial perfusion imaging study with significant gut uptake artifact.  Indeterminate for ischemia in the inferior/inferolateral segments as described above. 2.  Mildly dilated left ventricular cavity with mildly reduced ejection fraction, 45%. [place] [facility] [facility], [place] [address] [place] [address] [place], [place] [address] [phone] 4:35 PM  Dg Chest Portable 1 View  Result Date: 04/06/2019 CLINICAL DATA:  Heart failure. EXAM: PORTABLE CHEST 1 VIEW COMPARISON:  03/18/2019 FINDINGS: Normal heart size. Bilateral pleural effusions and  pulmonary edema identified compatible with moderate CHF. Diminished aeration to bilateral mid and lower lung zones identified. Upper lobes are relatively clear. IMPRESSION: 1. Moderate CHF. 2. Diminished aeration to bilateral mid and lower lung zones. Electronically Signed   By: Kerby Moors M.D.   On: 04/06/2019 09:32   Dg Chest Port 1 View  Result Date: 03/18/2019 CLINICAL DATA:  Respiratory failure. EXAM: PORTABLE CHEST 1 VIEW COMPARISON:  03/17/2019 FINDINGS: Endotracheal tube has tip 3 cm above the carina. Enteric tube courses into the region of the stomach and off the film as tip is not visualized. Lungs are adequately inflated and demonstrate mild hazy prominence of the perihilar/infrahilar vasculature right greater than left with slight interval improvement. No effusion. Cardiomediastinal silhouette and remainder of the exam is unchanged. IMPRESSION: Findings suggesting mild vascular congestion with some interval improvement. Tubes and lines as described. Electronically Signed   By:  Marin Olp M.D.   On: 03/18/2019 07:27   Dg Chest Port 1 View  Result Date: 03/17/2019 CLINICAL DATA:  Respiratory failure EXAM: PORTABLE CHEST 1 VIEW COMPARISON:  Chest radiograph dated 03/16/2019 FINDINGS: An endotracheal tube terminates 1.6 cm from the carina. An enteric tube enters the stomach and terminates below the field of view. The heart remains enlarged. Moderate airspace opacities in the bilateral mid and lower lungs have increased since prior exam. A left pleural effusion may contribute. There is no right pleural effusion. There is no pneumothorax. IMPRESSION: 1. Endotracheal tube terminates 1.6 cm from the carina. 2. Increased airspace opacities in the mid and lower lungs bilaterally. A left pleural effusion may contribute. 3. Unchanged cardiomegaly. Electronically Signed   By: Zerita Boers M.D.   On: 03/17/2019 14:24   Dg Chest Portable 1 View  Result Date: 03/16/2019 CLINICAL DATA:  Shortness  of breath. Evaluate ET tube placement. EXAM: PORTABLE CHEST 1 VIEW COMPARISON:  03/16/2019 FINDINGS: There has been interval placement of ET tube with tip above the carina. A nasogastric tube is identified with tip below the field of view. Mild cardiac enlargement and mild pulmonary edema. Worsening airspace opacity within the right midlung and right base. There is also a progressive opacity in the left midlung. IMPRESSION: 1. Worsening bilateral airspace opacities. 2. Mild pulmonary edema. 3. Status post intubation Electronically Signed   By: Kerby Moors M.D.   On: 03/16/2019 08:29   Dg Chest Portable 1 View  Result Date: 03/16/2019 CLINICAL DATA:  Short of breath. EXAM: PORTABLE CHEST 1 VIEW COMPARISON:  12/10/2018 FINDINGS: Cardiac enlargement. Mild diffuse pulmonary edema. Bilateral lower lobe airspace opacities are noted right greater than left. IMPRESSION: 1. Cardiac enlargement and pulmonary edema. 2. Bilateral lower lobe airspace opacities compatible with edema or pneumonia. Electronically Signed   By: Kerby Moors M.D.   On: 03/16/2019 07:29   Vas [place] Upper Extremity Venous Duplex  Result Date: 03/17/2019 UPPER VENOUS STUDY  Indications: Swelling Comparison Study: no prior Performing Technologist: Rennis Harding RN  Examination Guidelines: A complete evaluation includes B-mode imaging, spectral Doppler, color Doppler, and power Doppler as needed of all accessible portions of each vessel. Bilateral testing is considered an integral part of a complete examination. Limited examinations for reoccurring indications may be performed as noted.  Right Findings: +----------+------------+---------+-----------+----------+-------+ RIGHT     CompressiblePhasicitySpontaneousPropertiesSummary +----------+------------+---------+-----------+----------+-------+ IJV           Full       Yes       Yes                      +----------+------------+---------+-----------+----------+-------+  Subclavian    Full       Yes       Yes                      +----------+------------+---------+-----------+----------+-------+ Axillary      Full       Yes       Yes                      +----------+------------+---------+-----------+----------+-------+ Brachial      Full       Yes       Yes                      +----------+------------+---------+-----------+----------+-------+ Radial        Full                                          +----------+------------+---------+-----------+----------+-------+  Ulnar         Full                                          +----------+------------+---------+-----------+----------+-------+ Cephalic      Full                                          +----------+------------+---------+-----------+----------+-------+ Basilic       Full                                          +----------+------------+---------+-----------+----------+-------+  Left Findings: +----------+------------+---------+-----------+----------+-------+ LEFT      CompressiblePhasicitySpontaneousPropertiesSummary +----------+------------+---------+-----------+----------+-------+ Subclavian               Yes       Yes                      +----------+------------+---------+-----------+----------+-------+  Summary:  Right: No evidence of deep vein thrombosis in the upper extremity. No evidence of superficial vein thrombosis in the upper extremity.  Left: No evidence of thrombosis in the subclavian.  *See table(s) above for measurements and observations.  Diagnosing physician: Monica Martinez MD Electronically signed by Monica Martinez MD on 03/17/2019 at 1:44:06 PM.    Final      Subjective: Eager to go home  Discharge Exam: Vitals:   04/10/19 0758 04/10/19 1126  BP:  (!) 152/69  Pulse: 66   Resp: 20   Temp:  98.5 F (36.9 C)  SpO2: 95%    Vitals:   04/10/19 0530 04/10/19 0739 04/10/19 0758 04/10/19 1126  BP: (!) 144/59 (!) 146/58   (!) 152/69  Pulse: (!) 57 61 66   Resp: 20  20   Temp: 98.2 F (36.8 C) 98 F (36.7 C)  98.5 F (36.9 C)  TempSrc: Oral Oral  Oral  SpO2: 98% 98% 95%   Weight: 121.7 kg     Height:        General: Pt is alert, awake, not in acute distress Cardiovascular: RRR, S1/S2 +, no rubs, no gallops Respiratory: CTA bilaterally, no wheezing, no rhonchi Abdominal: Soft, NT, ND, bowel sounds + Extremities: no edema, no cyanosis   The results of significant diagnostics from this hospitalization (including imaging, microbiology, ancillary and laboratory) are listed below for reference.     Microbiology: Recent Results (from the past 240 hour(s))  SARS Coronavirus 2 by RT PCR (hospital order, performed in [facility] hospital lab) Nasopharyngeal Nasopharyngeal Swab     Status: None   Collection Time: 04/06/19  8:51 AM   Specimen: Nasopharyngeal Swab  Result Value Ref Range Status   SARS Coronavirus 2 NEGATIVE NEGATIVE Final    Comment: (NOTE) If result is NEGATIVE SARS-CoV-2 target nucleic acids are NOT DETECTED. The SARS-CoV-2 RNA is generally detectable in upper and lower  respiratory specimens during the acute phase of infection. The lowest  concentration of SARS-CoV-2 viral copies this assay can detect is 250  copies / mL. A negative result does not preclude SARS-CoV-2 infection  and should not be used as the sole basis for treatment or other  patient management decisions.  A negative result may occur with  improper specimen collection / handling, submission of specimen other  than nasopharyngeal swab, presence of viral mutation(s) within the  areas targeted by this assay, and inadequate number of viral copies  (<250 copies / mL). A negative result must be combined with clinical  observations, patient history, and epidemiological information. If result is POSITIVE SARS-CoV-2 target nucleic acids are DETECTED. The SARS-CoV-2 RNA is generally detectable in upper and lower   respiratory specimens dur ing the acute phase of infection.  Positive  results are indicative of active infection with SARS-CoV-2.  Clinical  correlation with patient history and other diagnostic information is  necessary to determine patient infection status.  Positive results do  not rule out bacterial infection or co-infection with other viruses. If result is PRESUMPTIVE POSTIVE SARS-CoV-2 nucleic acids MAY BE PRESENT.   A presumptive positive result was obtained on the submitted specimen  and confirmed on repeat testing.  While 2019 novel coronavirus  (SARS-CoV-2) nucleic acids may be present in the submitted sample  additional confirmatory testing may be necessary for epidemiological  and / or clinical management purposes  to differentiate between  SARS-CoV-2 and other Sarbecovirus currently known to infect humans.  If clinically indicated additional testing with an alternate test  methodology [phone]) is advised. The SARS-CoV-2 RNA is generally  detectable in upper and lower respiratory sp ecimens during the acute  phase of infection. The expected result is Negative. Fact Sheet for Patients:  StrictlyIdeas.no Fact Sheet for Healthcare Providers: BankingDealers.co.za This test is not yet approved or cleared by the [place] FDA and has been authorized for detection and/or diagnosis of SARS-CoV-2 by FDA under an Emergency Use Authorization (EUA).  This EUA will remain in effect (meaning this test can be used) for the duration of the COVID-19 declaration under Section 564(b)(1) of the Act, 21 U.S.C. section 360bbb-3(b)(1), unless the authorization is terminated or revoked sooner. Performed at [facility], [place] [address], [place], [place] [address]   MRSA PCR Screening     Status: Abnormal   Collection Time: 04/06/19  5:02 PM   Specimen: Nasal Mucosa; Nasopharyngeal  Result Value Ref Range Status   MRSA by PCR POSITIVE  (A) NEGATIVE Final    Comment:        The GeneXpert MRSA Assay (FDA approved for NASAL specimens only), is one component of a comprehensive MRSA colonization surveillance program. It is not intended to diagnose MRSA infection nor to guide or monitor treatment for MRSA infections. RESULT CALLED TO, READ BACK BY AND VERIFIED WITH: RN [facility] CVIJETIC [id number] FCP Performed at [facility], [place] [address] [place], [place] [address]      Labs: BNP (last 3 results) Recent Labs    03/16/19 0705 03/19/19 1451 04/06/19 0813  BNP 403.3* 1,091.9* 300.9*   Basic Metabolic Panel: Recent Labs  Lab 04/06/19 0813 04/07/19 0439 04/08/19 0601 04/09/19 0357 04/09/19 1038 04/09/19 1047 04/09/19 1204 04/10/19 0541  NA 142 146* 141 142 142 143  --  140  K 3.7 3.1* 2.9* 3.7 3.7 3.7  --  3.5  CL 105 102 100 102  --   --   --  104  CO2 _0 --   --   --  27  GLUCOSE 207* 135* 113* 114*  --   --   --  114*  BUN _1 --   --   --  10  CREATININE 0.69 0.78 0.78 0.78  --   --  0.71 0.73  CALCIUM 9.8 10.1 9.7 10.0  --   --   --  10.0   Liver Function Tests: No results for input(s): AST, ALT, ALKPHOS, BILITOT, PROT, ALBUMIN in the last 168 hours. No results for input(s): LIPASE, AMYLASE in the last 168 hours. No results for input(s): AMMONIA in the last 168 hours. CBC: Recent Labs  Lab 04/06/19 0813 04/07/19 0439 04/09/19 1038 04/09/19 1047 04/09/19 1204  WBC 10.7* 7.7  --   --  7.5  NEUTROABS 9.6* 6.8  --   --   --   HGB 14.9 13.6 11.9* 12.2* 13.4  HCT 46.2 41.6 35.0* 36.0* 41.0  MCV 99.6 96.5  --   --  96.2  PLT 200 193  --   --  148*   Cardiac Enzymes: No results for input(s): CKTOTAL, CKMB, CKMBINDEX, TROPONINI in the last 168 hours. BNP: Invalid input(s): POCBNP CBG: Recent Labs  Lab 04/09/19 0626 04/09/19 1644 04/09/19 2140 04/10/19 0633 04/10/19 1111  GLUCAP 117* 111* 99 105* 93   D-Dimer No results for input(s): DDIMER in the last 72  hours. Hgb A1c No results for input(s): HGBA1C in the last 72 hours. Lipid Profile Recent Labs    04/10/19 0541  CHOL 125  HDL 41  LDLCALC 63  TRIG 103  CHOLHDL 3.0   Thyroid function studies Recent Labs    04/10/19 0541  TSH 3.232   Anemia work up No results for input(s): VITAMINB12, FOLATE, FERRITIN, TIBC, IRON, RETICCTPCT in the last 72 hours. Urinalysis    Component Value Date/Time   COLORURINE STRAW (A) 03/16/2019 0810   APPEARANCEUR CLEAR 03/16/2019 0810   LABSPEC 1.005 03/16/2019 0810   PHURINE 6.0 03/16/2019 0810   GLUCOSEU 50 (A) 03/16/2019 0810   HGBUR NEGATIVE 03/16/2019 0810   BILIRUBINUR NEGATIVE 03/16/2019 0810   KETONESUR NEGATIVE 03/16/2019 0810   PROTEINUR 100 (A) 03/16/2019 0810   UROBILINOGEN 0.2 03/24/2013 1255   NITRITE NEGATIVE 03/16/2019 0810   LEUKOCYTESUR NEGATIVE 03/16/2019 0810   Sepsis Labs Invalid input(s): PROCALCITONIN,  WBC,  LACTICIDVEN Microbiology Recent Results (from the past 240 hour(s))  SARS Coronavirus 2 by RT PCR (hospital order, performed in [facility] hospital lab) Nasopharyngeal Nasopharyngeal Swab     Status: None   Collection Time: 04/06/19  8:51 AM   Specimen: Nasopharyngeal Swab  Result Value Ref Range Status   SARS Coronavirus 2 NEGATIVE NEGATIVE Final    Comment: (NOTE) If result is NEGATIVE SARS-CoV-2 target nucleic acids are NOT DETECTED. The SARS-CoV-2 RNA is generally detectable in upper and lower  respiratory specimens during the acute phase of infection. The lowest  concentration of SARS-CoV-2 viral copies this assay can detect is 250  copies / mL. A negative result does not preclude SARS-CoV-2 infection  and should not be used as the sole basis for treatment or other  patient management decisions.  A negative result may occur with  improper specimen collection / handling, submission of specimen other  than nasopharyngeal swab, presence of viral mutation(s) within the  areas targeted by this assay, and  inadequate number of viral copies  (<250 copies / mL). A negative result must be combined with clinical  observations, patient history, and epidemiological information. If result is POSITIVE SARS-CoV-2 target nucleic acids are DETECTED. The SARS-CoV-2 RNA is generally detectable in upper and lower  respiratory specimens dur ing the acute phase of infection.  Positive  results are indicative of active infection with SARS-CoV-2.  Clinical  correlation with patient  history and other diagnostic information is  necessary to determine patient infection status.  Positive results do  not rule out bacterial infection or co-infection with other viruses. If result is PRESUMPTIVE POSTIVE SARS-CoV-2 nucleic acids MAY BE PRESENT.   A presumptive positive result was obtained on the submitted specimen  and confirmed on repeat testing.  While 2019 novel coronavirus  (SARS-CoV-2) nucleic acids may be present in the submitted sample  additional confirmatory testing may be necessary for epidemiological  and / or clinical management purposes  to differentiate between  SARS-CoV-2 and other Sarbecovirus currently known to infect humans.  If clinically indicated additional testing with an alternate test  methodology [phone]) is advised. The SARS-CoV-2 RNA is generally  detectable in upper and lower respiratory sp ecimens during the acute  phase of infection. The expected result is Negative. Fact Sheet for Patients:  StrictlyIdeas.no Fact Sheet for Healthcare Providers: BankingDealers.co.za This test is not yet approved or cleared by the [place] FDA and has been authorized for detection and/or diagnosis of SARS-CoV-2 by FDA under an Emergency Use Authorization (EUA).  This EUA will remain in effect (meaning this test can be used) for the duration of the COVID-19 declaration under Section 564(b)(1) of the Act, 21 U.S.C. section 360bbb-3(b)(1), unless the  authorization is terminated or revoked sooner. Performed at [facility], [place] [address] [place], [place] [address]   MRSA PCR Screening     Status: Abnormal   Collection Time: 04/06/19  5:02 PM   Specimen: Nasal Mucosa; Nasopharyngeal  Result Value Ref Range Status   MRSA by PCR POSITIVE (A) NEGATIVE Final    Comment:        The GeneXpert MRSA Assay (FDA approved for NASAL specimens only), is one component of a comprehensive MRSA colonization surveillance program. It is not intended to diagnose MRSA infection nor to guide or monitor treatment for MRSA infections. RESULT CALLED TO, READ BACK BY AND VERIFIED WITH: RN [facility] CVIJETIC [id number] FCP Performed at [facility], [place] [address], [place], [place] [address]    Time spent: 30 min  SIGNED:   Marylu Lund, MD  [facility] 04/10/2019, 11:38 AM  If 7PM-7AM, please contact night-coverage

## 2019-04-10 NOTE — TOC Transition Note (Signed)
Transition of Care [facility]) - CM/SW Discharge Note   Patient Details  Name: Francis Cardenas MRN: [id number] Date of Birth: April 27, 1951  [facility] CM/SW Contact:  [name], RN Phone Number: 04/10/2019, 2:08 PM   Clinical Narrative:    Patient from home with spouse, NCM offered choice for [facility], [place] he chose [facility], NCM made referral to [place] with [facility], she is able to take referral.  Soc will begin 24- 48 hrs post dc      Final next level of care: Home w Home Health Services Barriers to Discharge: No Barriers Identified   Patient Goals and CMS Choice Patient states their goals for this hospitalization and ongoing recovery are:: get better CMS Medicare.gov Compare Post Acute Care list provided to:: Patient Choice offered to / list presented to : Patient  Discharge Placement                       Discharge Plan and Services In-house Referral: NA Discharge Planning Services: CM Consult Post Acute Care Choice: Home Health          DME Arranged: (NA)         HH Arranged: RN, PT [place] Agency: [facility] [employer]) Date [place]: 04/09/19 Time [place]: [place] Representative spoke with at [place]: [place] ([place]) Interventions     Readmission Risk Interventions Readmission Risk Prevention Plan 03/21/2019 03/21/2019  Transportation Screening - Complete  PCP or Specialist Appt within 3-5 Days - (No Data)  [place] or [place] - Complete  Social Work Consult for [place] Planning/Counseling - Patient refused  Palliative Care Screening - Not Applicable  Medication Review (RN Care Manager) Referral to Pharmacy Complete  Some recent data might be hidden

## 2019-04-10 NOTE — Progress Notes (Signed)
SATURATION QUALIFICATIONS: (This note is used to comply with regulatory documentation for home oxygen)  Patient Saturations on Room Air at Rest = 96%  Patient Saturations on Room Air while Ambulating = 91%  Patient continued to speak during ambulation. Pt denies any chest pain, dizziness, or worsening leg pain. Pt did report slight shortness of breath but no worse than while sitting. Did not need to rest during ambulation.

## 2019-04-10 NOTE — Care Management Important Message (Signed)
Important Message  Patient Details  Name: DIERKS MCLAUCHLAN MRN: [id number] Date of Birth: 18-Jul-1950   Medicare Important Message Given:  Yes     Shelda Altes 04/10/2019, 12:51 PM

## 2019-04-11 NOTE — Telephone Encounter (Signed)
Follow up ° ° ° ° °Please return call to patient °

## 2019-04-11 NOTE — Telephone Encounter (Signed)
**Note De-Identified Antonia Culbertson Obfuscation** TCM Call 1st Attempt I left a message on the pts VM asking him to call me back.

## 2019-04-12 ENCOUNTER — Telehealth: Payer: Self-pay | Admitting: Cardiology

## 2019-04-12 NOTE — Telephone Encounter (Signed)
New message:     Patient would like for someone to call him. Patient states someone called him. Please call patient back.

## 2019-04-12 NOTE — Telephone Encounter (Signed)
**Note De-Identified Kana Reimann Obfuscation** TCM Call 2nd attempt: I left a message on the pts VM asking him to call me back. I did leave a reminder of his f/u scheduled with Dr Radford Pax on 04/24/2019 at 1:40 at [facility] in [place].

## 2019-04-24 ENCOUNTER — Ambulatory Visit: Payer: Medicare HMO | Admitting: Cardiology

## 2019-07-16 ENCOUNTER — Encounter: Payer: Self-pay | Admitting: Neurology

## 2019-07-16 ENCOUNTER — Other Ambulatory Visit: Payer: Self-pay

## 2019-07-16 DIAGNOSIS — G5601 Carpal tunnel syndrome, right upper limb: Secondary | ICD-10-CM

## 2019-08-07 ENCOUNTER — Other Ambulatory Visit: Payer: Self-pay

## 2019-08-07 ENCOUNTER — Ambulatory Visit (INDEPENDENT_AMBULATORY_CARE_PROVIDER_SITE_OTHER): Payer: Medicare HMO | Admitting: Neurology

## 2019-08-07 DIAGNOSIS — G5601 Carpal tunnel syndrome, right upper limb: Secondary | ICD-10-CM

## 2019-08-07 DIAGNOSIS — G5603 Carpal tunnel syndrome, bilateral upper limbs: Secondary | ICD-10-CM

## 2019-08-07 IMAGING — DX PORTABLE CHEST - 1 VIEW
1 series · 1 of 1 positions shown · non-contrast
Comparison: 01/28/2018.

CLINICAL DATA: Shortness of breath for the past 3-4 weeks. Pain in
the right clavicular region following a fall from a deck. Ex-smoker.

EXAM:
PORTABLE CHEST 1 VIEW

[chest ap]
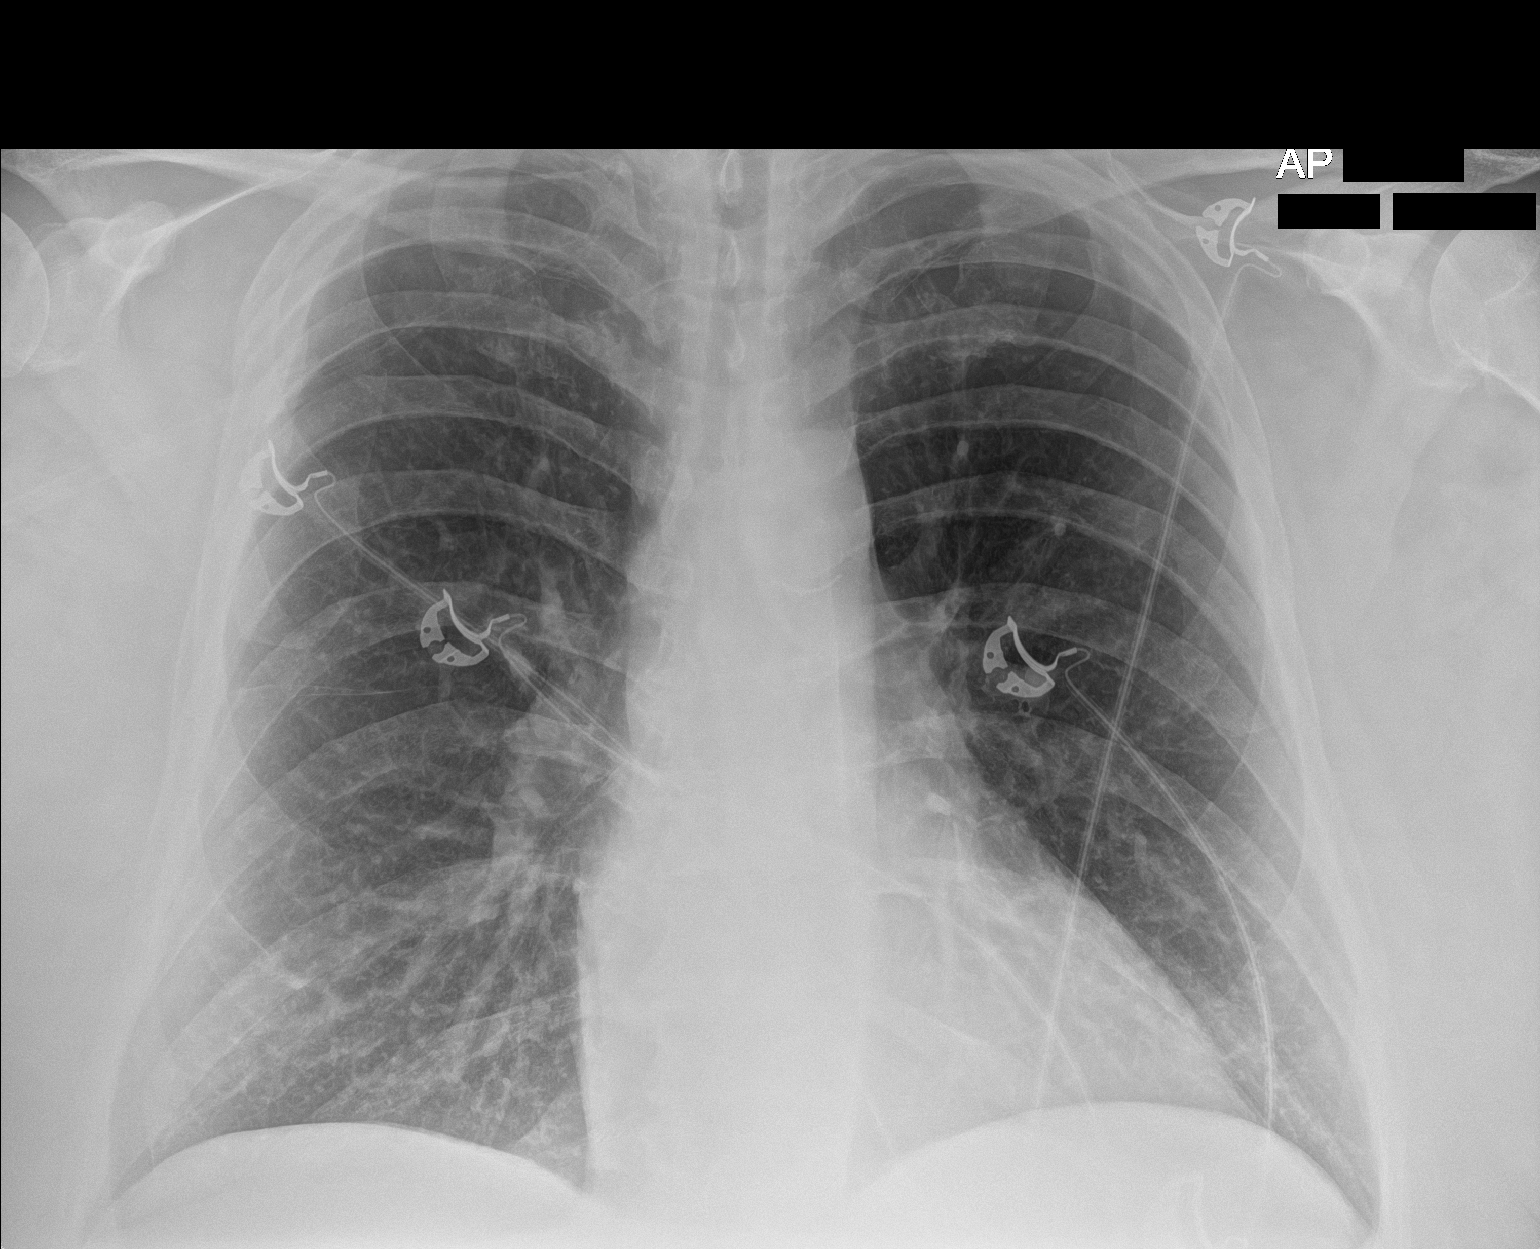

[1 of 1 positions shown; findings below may reference images not displayed]

FINDINGS: Normal sized heart. Clear lungs. The lungs are mildly hyperexpanded
with mild peribronchial thickening. Thoracic spine degenerative
changes.
IMPRESSION: No acute abnormality. Mild changes of COPD and chronic bronchitis.

## 2019-08-07 NOTE — Procedures (Signed)
[facility]  [place], [place]  [place], [place] [address] Tel: [phone] Fax:  [phone] Test Date:  08/07/2019  Patient: Francis Cardenas DOB: [birth year] Physician: Narda Amber, DO  Sex: Male Height: 5\' 10"  Ref Phys: Debby Bud, MD  ID#: [id number] Temp: 32.0C Technician:    Patient Complaints: This is a 69 year old man with cervical disc disease referred for evaluation of bilateral hand paresthesias and right hand weakness.  NCV & EMG Findings: Extensive electrodiagnostic testing of the right upper extremity and additional studies of the left shows:  1. Right median sensory response is absent.  Left median sensory response shows prolonged latency (4.2 ms) and reduced amplitude (6.2 V).  Bilateral ulnar sensory responses are within normal limits. 2. Right median motor response is absent.  Left median and bilateral ulnar motor responses are within normal limits. 3. Despite maximal activation, no motor unit recruitment is seen in the right abductor pollicis brevis muscle.  There is no evidence of accompanied active denervation.  The remaining tested muscles showed normal motor unit recruitment and configuration.  Impression: 1. 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   2. Left median neuropathy at or distal to the wrist (moderate), consistent with a clinical diagnosis of carpal tunnel syndrome.     ___________________________ Narda Amber, DO    Nerve Conduction Studies Anti Sensory Summary Table   Stim Site NR Peak (ms) Norm Peak (ms) P-T Amp (V) Norm P-T Amp  Left Median Anti Sensory (2nd Digit)  32C  Wrist    4.2 <3.8 6.2 >10  Right Median Anti Sensory (2nd Digit)  32C  Wrist NR  <3.8  >10  Left Ulnar Anti Sensory (5th Digit)  32C  Wrist    3.2 <3.2 14.9 >5  Right Ulnar Anti Sensory (5th Digit)  32C  Wrist    2.9 <3.2 16.4 >5   Motor Summary Table   Stim Site NR Onset (ms) Norm Onset (ms) O-P Amp (mV) Norm O-P Amp Site1 Site2 Delta-0 (ms) Dist (cm) Vel (m/s) Norm Vel (m/s)  Left Median Motor (Abd Poll Brev)  32C  Wrist    3.5 <4.0 6.3 >5 Elbow Wrist 5.8 31.0 53 >50  Elbow    9.3  5.6         Right Median Motor (Abd Poll Brev)  32C  Wrist NR  <4.0  >5 Elbow Wrist  0.0  >50  Elbow NR            Left Ulnar Motor (Abd Dig Minimi)  32C  Wrist    3.1 <3.1 8.7 >7 B Elbow Wrist 4.7 25.0 53 >50  B Elbow    7.8  8.2  A Elbow B Elbow 2.0 10.0 50 >50  A Elbow    9.8  7.9         Right Ulnar Motor (Abd Dig Minimi)  32C  Wrist    2.8 <3.1 8.9 >7 B Elbow Wrist 4.4 24.0 55 >50  B Elbow    7.2  8.0  A Elbow B Elbow 2.0 10.0 50 >50  A Elbow    9.2  7.4          EMG   Side Muscle Ins Act Fibs Psw Fasc Number Recrt Dur Dur. Amp Amp. Poly Poly. Comment  Left 1stDorInt Nml Nml Nml Nml Nml Nml Nml Nml Nml Nml Nml Nml N/A  Left Abd [place] Brev  Nml Nml Nml Nml Nml Nml Nml Nml Nml Nml Nml Nml N/A  Left PronatorTeres Nml Nml Nml Nml Nml Nml Nml Nml Nml Nml Nml Nml N/A  Left Biceps Nml Nml Nml Nml Nml Nml Nml Nml Nml Nml Nml Nml N/A  Left Triceps Nml Nml Nml Nml Nml Nml Nml Nml Nml Nml Nml Nml N/A  Left Deltoid Nml Nml Nml Nml Nml Nml Nml Nml Nml Nml Nml Nml N/A  Right 1stDorInt Nml Nml Nml Nml Nml Nml Nml Nml Nml Nml Nml Nml N/A  Right Abd Poll Brev Nml Nml Nml Nml NE None - - - - - - ATR  Right PronatorTeres Nml Nml Nml Nml Nml Nml Nml Nml Nml Nml Nml Nml N/A  Right Biceps Nml Nml Nml Nml Nml Nml Nml Nml Nml Nml Nml Nml N/A  Right Triceps Nml Nml Nml Nml Nml Nml Nml Nml Nml Nml Nml Nml N/A  Right Deltoid Nml Nml Nml Nml Nml Nml Nml Nml Nml Nml Nml Nml N/A      Waveforms:

## 2019-12-16 ENCOUNTER — Emergency Department (HOSPITAL_COMMUNITY): Payer: No Typology Code available for payment source

## 2019-12-16 ENCOUNTER — Other Ambulatory Visit: Payer: Self-pay

## 2019-12-16 ENCOUNTER — Emergency Department (HOSPITAL_COMMUNITY)
Admission: EM | Admit: 2019-12-16 | Discharge: 2019-12-16 | Disposition: A | Discharge status: Home / Self Care | Payer: No Typology Code available for payment source | Attending: Emergency Medicine | Admitting: Emergency Medicine

## 2019-12-16 ENCOUNTER — Encounter (HOSPITAL_COMMUNITY): Payer: Self-pay | Admitting: Pharmacy Technician

## 2019-12-16 DIAGNOSIS — R519 Headache, unspecified: Secondary | ICD-10-CM | POA: Diagnosis not present

## 2019-12-16 DIAGNOSIS — R531 Weakness: Secondary | ICD-10-CM | POA: Diagnosis present

## 2019-12-16 DIAGNOSIS — Z79899 Other long term (current) drug therapy: Secondary | ICD-10-CM | POA: Diagnosis not present

## 2019-12-16 DIAGNOSIS — E1140 Type 2 diabetes mellitus with diabetic neuropathy, unspecified: Secondary | ICD-10-CM | POA: Diagnosis not present

## 2019-12-16 DIAGNOSIS — R600 Localized edema: Secondary | ICD-10-CM | POA: Diagnosis not present

## 2019-12-16 DIAGNOSIS — J441 Chronic obstructive pulmonary disease with (acute) exacerbation: Secondary | ICD-10-CM | POA: Diagnosis not present

## 2019-12-16 DIAGNOSIS — E1159 Type 2 diabetes mellitus with other circulatory complications: Secondary | ICD-10-CM | POA: Insufficient documentation

## 2019-12-16 DIAGNOSIS — C61 Malignant neoplasm of prostate: Secondary | ICD-10-CM | POA: Diagnosis not present

## 2019-12-16 DIAGNOSIS — Z87891 Personal history of nicotine dependence: Secondary | ICD-10-CM | POA: Insufficient documentation

## 2019-12-16 DIAGNOSIS — R1114 Bilious vomiting: Secondary | ICD-10-CM | POA: Diagnosis not present

## 2019-12-16 DIAGNOSIS — R42 Dizziness and giddiness: Secondary | ICD-10-CM | POA: Insufficient documentation

## 2019-12-16 DIAGNOSIS — I110 Hypertensive heart disease with heart failure: Secondary | ICD-10-CM | POA: Insufficient documentation

## 2019-12-16 DIAGNOSIS — Z7984 Long term (current) use of oral hypoglycemic drugs: Secondary | ICD-10-CM | POA: Diagnosis not present

## 2019-12-16 DIAGNOSIS — R110 Nausea: Secondary | ICD-10-CM

## 2019-12-16 DIAGNOSIS — I5023 Acute on chronic systolic (congestive) heart failure: Secondary | ICD-10-CM | POA: Insufficient documentation

## 2019-12-16 DIAGNOSIS — Z7982 Long term (current) use of aspirin: Secondary | ICD-10-CM | POA: Insufficient documentation

## 2019-12-16 LAB — TROPONIN I (HIGH SENSITIVITY)
Troponin I (High Sensitivity): 11 ng/L (ref <18)
Troponin I (High Sensitivity): 18 ng/L — ABNORMAL HIGH (ref <18)

## 2019-12-16 LAB — BASIC METABOLIC PANEL
Anion gap: 11 (ref 5–15)
BUN: 14 mg/dL (ref 8–23)
CO2: 25 mmol/L (ref 22–32)
Calcium: 11.1 mg/dL — ABNORMAL HIGH (ref 8.9–10.3)
Chloride: 100 mmol/L (ref 98–111)
Creatinine, Ser: 1.11 mg/dL (ref 0.61–1.24)
GFR calc Af Amer: >60 mL/min (ref >60)
GFR calc non Af Amer: >60 mL/min (ref >60)
Glucose, Bld: 130 mg/dL — ABNORMAL HIGH (ref 70–99)
Potassium: 4.5 mmol/L (ref 3.5–5.1)
Sodium: 136 mmol/L (ref 135–145)

## 2019-12-16 LAB — CBC
HCT: 39.5 % (ref 39.0–52.0)
Hemoglobin: 12.7 g/dL — ABNORMAL LOW (ref 13.0–17.0)
MCH: 30.8 pg (ref 26.0–34.0)
MCHC: 32.2 g/dL (ref 30.0–36.0)
MCV: 95.9 fL (ref 80.0–100.0)
Platelets: 158 10*3/uL (ref 150–400)
RBC: 4.12 MIL/uL — ABNORMAL LOW (ref 4.22–5.81)
RDW: 14 % (ref 11.5–15.5)
WBC: 7.5 10*3/uL (ref 4.0–10.5)
nRBC: 0 % (ref 0.0–0.2)

## 2019-12-16 LAB — URINALYSIS, ROUTINE W REFLEX MICROSCOPIC
Bilirubin Urine: NEGATIVE
Glucose, UA: NEGATIVE mg/dL
Hgb urine dipstick: NEGATIVE
Ketones, ur: NEGATIVE mg/dL
Leukocytes,Ua: NEGATIVE
Nitrite: NEGATIVE
Protein, ur: NEGATIVE mg/dL
Specific Gravity, Urine: 1.012 (ref 1.005–1.030)
pH: 6 (ref 5.0–8.0)

## 2019-12-16 LAB — BRAIN NATRIURETIC PEPTIDE: B Natriuretic Peptide: 148.8 pg/mL — ABNORMAL HIGH (ref 0.0–100.0)

## 2019-12-16 MED ORDER — DIPHENHYDRAMINE HCL 50 MG/ML IJ SOLN
25.0000 mg | Freq: Once | INTRAMUSCULAR | Status: AC
  Administered 2019-12-16: 25 mg via INTRAVENOUS
  Filled 2019-12-16: qty 1

## 2019-12-16 MED ORDER — LACTATED RINGERS IV BOLUS
500.0000 mL | Freq: Once | INTRAVENOUS | Status: AC
  Administered 2019-12-16: 500 mL via INTRAVENOUS

## 2019-12-16 MED ORDER — PROCHLORPERAZINE EDISYLATE 10 MG/2ML IJ SOLN
10.0000 mg | Freq: Once | INTRAMUSCULAR | Status: AC
  Administered 2019-12-16: 10 mg via INTRAVENOUS
  Filled 2019-12-16: qty 2

## 2019-12-16 MED ORDER — SODIUM CHLORIDE 0.9% FLUSH
3.0000 mL | Freq: Once | INTRAVENOUS | Status: AC
  Administered 2019-12-16: 3 mL via INTRAVENOUS

## 2019-12-16 MED ORDER — ONDANSETRON HCL 4 MG/2ML IJ SOLN
4.0000 mg | Freq: Once | INTRAMUSCULAR | Status: AC
  Administered 2019-12-16: 4 mg via INTRAVENOUS
  Filled 2019-12-16: qty 2

## 2019-12-16 NOTE — ED Triage Notes (Signed)
Pt bib ems from home with increased falls and dizziness for the last week. Pt with nausea, no vomiting. Pt reports headache, nausea and dizziness today with neck and chest tightness. Given zofran en route with no improvement. No blood thinners.  162/79 HR 65 95% RA CBG 138

## 2019-12-16 NOTE — ED Notes (Signed)
Pt provided urinal, cardiac monitoring in place at this time.

## 2019-12-16 NOTE — ED Provider Notes (Signed)
[facility] EMERGENCY DEPARTMENT Provider Note   CSN: [id number] Arrival date & time: 12/16/19  1128     History No chief complaint on file.   Francis Cardenas is a 69 y.o. male w PMHx HTN, HLD, COPD, DM type II, BPH,andprostate cancer who presents with nausea, headache, confusion.  Patient endorses 2 recent falls, last on Friday.  Patient states he hit his head.  Denies LOC.  Patient endorses worsening confusion that he says "cannot get my thoughts straight" for the past several months.  Patient concerned that this has worsened over the last few days following fall.  Patient endorses chest pressure, without chest pain.  Denies nausea, emesis, abdominal pain, changes in urination.  Patient states he was also concerned that his blood pressure seemed lower than normal with BP 120s/40s.  Patient states he has been eating and drinking slightly less than normal with only 1-2 meals per day.  Patient denies large changes in swelling to lower extremities.  Patient has been taking torsemide as directed.  Patient denies fever, URI symptoms.  Patient has received both Covid vaccines.  HPI      Past Medical History:  Diagnosis Date  . BPH (benign prostatic hyperplasia)   . Cancer [facility])    prostate  . COPD (chronic obstructive pulmonary disease) ([place])   . Diabetes mellitus without complication ([place])   . Hypercholesteremia   . Hypertension     Patient Active Problem List   Diagnosis Date Noted  . Hypertensive urgency 04/06/2019  . Elevated troponin 04/06/2019  . Community acquired pneumonia   . COPD with acute exacerbation ([place]) 12/12/2018  . Morbid obesity ([place]) 12/12/2018  . Chronic diastolic CHF (congestive heart failure) ([place]) 12/10/2018  . Fall 12/10/2018  . Multiple rib fractures 12/10/2018  . OSA on CPAP 12/10/2018  . Hypokalemia 12/10/2018  . Acute on chronic systolic CHF (congestive heart failure) ([place])   . Class 2 severe obesity due to excess calories with  serious comorbidity and body mass index (BMI) of 37.0 to 37.9 in adult [facility])   . CHF (congestive heart failure) ([place]) 01/28/2018  . Acute CHF (congestive heart failure) ([place]) 12/31/2017  . Acute respiratory failure with hypoxia ([place]) 12/30/2017  . Chest pain syndrome 12/30/2017  . Carcinoma of prostate ([place]) 08/02/2016  . COPD (chronic obstructive pulmonary disease) ([place]) 08/02/2016  . GERD (gastroesophageal reflux disease) 08/02/2016  . Hyperlipidemia 08/02/2016  . Peripheral neuropathy 08/02/2016  . Steatosis of liver 08/02/2016  . Diabetes mellitus type 2 in obese ([place]) 08/02/2016  . Benign essential hypertension 06/11/2013  . Alcohol dependence ([place]) 03/26/2013  . PTSD (post-traumatic stress disorder) 03/26/2013  . Alcohol abuse 03/24/2013  . Depressive disorder 03/24/2013    Past Surgical History:  Procedure Laterality Date  . ACHILLES TENDON REPAIR Left   . KNEE ARTHROSCOPY Left   . RIGHT/LEFT HEART CATH AND CORONARY ANGIOGRAPHY N/A 04/09/2019   Procedure: RIGHT/LEFT HEART CATH AND CORONARY ANGIOGRAPHY;  Surgeon: Martinique, Peter M, MD;  Location: [facility];  Service: Cardiovascular;  Laterality: N/A;  . TONSILLECTOMY    . WRIST FRACTURE SURGERY Left        Family History  Problem Relation Age of Onset  . CAD Mother   . Leukemia Mother   . Heart attack Father   . Heart attack Brother     Social History   Tobacco Use  . Smoking status: Former Research scientist (life sciences)  . Smokeless tobacco: Never Used  Substance Use Topics  . Alcohol  use: Yes    Alcohol/week: 18.0 standard drinks    Types: 18 Cans of beer per week    Comment: 18 cans beer/day  . Drug use: No    Home Medications Prior to Admission medications   Medication Sig Start Date End Date Taking? Authorizing Provider  albuterol (PROVENTIL HFA;VENTOLIN HFA) 108 (90 BASE) MCG/ACT inhaler Inhale 2 puffs into the lungs every 6 (six) hours as needed for wheezing.   Yes [provider]  ALPRAZolam Duanne Moron) 1 MG  tablet Take 1 mg by mouth at bedtime as needed for anxiety or sleep.  12/05/18  Yes [provider]  amLODipine (NORVASC) 10 MG tablet Take 10 mg by mouth daily with breakfast.    Yes [provider]  aspirin EC 81 MG tablet Take 81 mg by mouth daily with breakfast.    Yes [provider]  atorvastatin (LIPITOR) 80 MG tablet Take 40 mg by mouth daily.    Yes [provider]  budesonide-formoterol (SYMBICORT) 80-4.5 MCG/ACT inhaler Inhale 2 puffs into the lungs daily.    Yes [provider]  Carboxymethylcellulose Sodium 0.25 % SOLN Place 1 drop into both eyes 4 (four) times daily as needed (dry eyes/ irritation).   Yes [provider]  carvedilol (COREG) 25 MG tablet Take 1 tablet (25 mg total) by mouth 2 (two) times daily with a meal. For hypertension. 03/31/13  Yes Mashburn, Marlane Hatcher, PA-C  cholecalciferol (VITAMIN D) 1000 units tablet Take 1,000 Units by mouth daily with supper.    Yes [provider]  diclofenac sodium (VOLTAREN) 1 % GEL Apply 1 application topically 2 (two) times daily as needed (athritis pain).  10/12/18  Yes [provider]  Ensure Max Protein (ENSURE MAX PROTEIN) LIQD Take 330 mLs (11 oz total) by mouth 2 (two) times daily. Patient taking differently: Take 11 oz by mouth 2 (two) times daily as needed (meal supplement).  12/12/18  Yes [name], Nishant, MD  gabapentin (NEURONTIN) 300 MG capsule Take 300 mg by mouth 3 (three) times daily.   Yes [provider]  hydrALAZINE (APRESOLINE) 25 MG tablet Take 1 tablet (25 mg total) by mouth every 8 (eight) hours. Patient taking differently: Take 25 mg by mouth daily.  04/10/19 12/21/19 Yes Donne Hazel, MD  lisinopril (PRINIVIL,ZESTRIL) 40 MG tablet Take 1 tablet (40 mg total) by mouth daily. For hypertension. Patient taking differently: Take 40 mg by mouth daily with breakfast. For hypertension. 03/31/13  Yes Mashburn, Milta Deiters T, PA-C  magnesium oxide (MAG-OX)  400 MG tablet Take 1 tablet (400 mg total) by mouth daily. Patient taking differently: Take 400 mg by mouth daily with supper.  01/31/18  Yes Barton Dubois, MD  meloxicam (MOBIC) 15 MG tablet Take 15 mg by mouth at bedtime. 10/31/18  Yes [provider]  Multiple Vitamins-Minerals (MULTIVITAMIN WITH MINERALS) tablet Take 1 tablet by mouth daily with lunch.    Yes [provider]  omeprazole (PRILOSEC) 40 MG capsule Take 40 mg by mouth daily with supper.    Yes [provider]  potassium chloride SA (K-DUR,KLOR-CON) 20 MEQ tablet Take 2 tablets (40 mEq total) by mouth daily. Patient taking differently: Take 40 mEq by mouth daily with supper.  01/31/18  Yes Barton Dubois, MD  PRESCRIPTION MEDICATION Inhale into the lungs at bedtime. CPAP   Yes [provider]  spironolactone (ALDACTONE) 25 MG tablet Take 1 tablet (25 mg total) by mouth daily. 04/11/19 12/21/19 Yes Donne Hazel, MD  torsemide (DEMADEX) 20 MG tablet Take 1 tablet (20 mg total) by mouth 2 (two) times daily. 04/10/19 12/21/19 Yes Donne Hazel, MD  vitamin B-12 (CYANOCOBALAMIN) 1000 MCG tablet Take 1,000 mcg by mouth daily with supper.    Yes [provider]  sertraline (ZOLOFT) 100 MG tablet Take one pill by mouth each day for depression and anxiety. Patient not taking: Reported on 12/16/2019 03/31/13   Nena Polio T, PA-C  tiotropium (SPIRIVA HANDIHALER) 18 MCG inhalation capsule Place 1 capsule (18 mcg total) into inhaler and inhale daily. Patient not taking: Reported on 03/16/2019 12/12/18 12/12/19  Louellen Molder, MD    Allergies    Patient has no known allergies.  Review of Systems   Review of Systems  Constitutional: Positive for chills. Negative for appetite change, fever and unexpected weight change.  HENT: Negative for congestion, rhinorrhea and sore throat.   Eyes: Negative for photophobia and visual disturbance.  Respiratory: Negative for cough and shortness of breath.    Cardiovascular: Positive for chest pain.       Endorses chest pressure    Gastrointestinal: Positive for nausea. Negative for abdominal pain, diarrhea and vomiting.  Genitourinary: Negative for difficulty urinating, dysuria and hematuria.  Musculoskeletal: Negative for back pain and neck pain.  Skin: Negative for rash and wound.  Neurological: Positive for dizziness and headaches. Negative for syncope and facial asymmetry.       Endorses some confusion worsening over the last few months   Psychiatric/Behavioral: The patient is nervous/anxious.     Physical Exam Updated Vital Signs BP (!) 151/67   Pulse 74   Temp 98.6 F (37 C) (Oral)   Resp 16   Ht 5\' 10"  (1.778 m)   Wt (!) 125.6 kg   SpO2 98%   BMI 39.75 kg/m   Physical Exam Vitals and nursing note reviewed.  Constitutional:      General: He is not in acute distress.    Appearance: He is obese. He is not ill-appearing, toxic-appearing or diaphoretic.  HENT:     Head: Normocephalic and atraumatic.     Nose: Nose normal.  Eyes:     Extraocular Movements: Extraocular movements intact.     Conjunctiva/sclera: Conjunctivae normal.  Cardiovascular:     Rate and Rhythm: Normal rate and regular rhythm.     Heart sounds: Normal heart sounds.  Pulmonary:     Effort: Pulmonary effort is normal. No respiratory distress.     Breath sounds: Normal breath sounds. No wheezing or rales.  Chest:     Chest wall: No tenderness.  Abdominal:     Palpations: Abdomen is soft.     Tenderness: There is no abdominal tenderness. There is no guarding or rebound.  Musculoskeletal:     Cervical back: Normal range of motion. No tenderness.     Right lower leg: Edema present.     Left lower leg: Edema present.     Comments: Trace bilateral pitting edema   Skin:    General: Skin is warm.     Findings: No rash.  Neurological:     General: No focal deficit present.     Mental Status: He is alert. Mental status is at baseline.     Cranial  Nerves: No cranial nerve deficit.     Sensory: No sensory deficit.     Motor: Weakness present.     Coordination: Coordination normal.     Comments: Concern for weakness of RLE versus LLE.   Psychiatric:  Mood and Affect: Mood is anxious.     ED Results / Procedures / Treatments   Labs (all labs ordered are listed, but only abnormal results are displayed) Labs Reviewed  BASIC METABOLIC PANEL - Abnormal; Notable for the following components:      Result Value   Glucose, Bld 130 (*)    Calcium 11.1 (*)    All other components within normal limits  CBC - Abnormal; Notable for the following components:   RBC 4.12 (*)    Hemoglobin 12.7 (*)    All other components within normal limits  BRAIN NATRIURETIC PEPTIDE - Abnormal; Notable for the following components:   B Natriuretic Peptide 148.8 (*)    All other components within normal limits  TROPONIN I (HIGH SENSITIVITY) - Abnormal; Notable for the following components:   Troponin I (High Sensitivity) 18 (*)    All other components within normal limits  URINALYSIS, ROUTINE W REFLEX MICROSCOPIC  TROPONIN I (HIGH SENSITIVITY)    EKG EKG Interpretation  Date/Time:  Monday December 16 2019 11:34:59 EDT Ventricular Rate:  73 PR Interval:  210 QRS Duration: 92 QT Interval:  412 QTC Calculation: 453 R Axis:   74 Text Interpretation: Sinus rhythm with 1st degree A-V block Cannot rule out Anterior infarct , age undetermined Abnormal ECG No significant change since last tracing Confirmed by Deno Etienne [phone]) on 12/16/2019 5:23:17 PM   Radiology DG Chest 2 View  Result Date: 12/16/2019 CLINICAL DATA:  Chest tightness EXAM: CHEST - 2 VIEW COMPARISON:  RIGHT April 06, 2019 FINDINGS: The cardiomediastinal silhouette is unchanged in contour.Resolution of previously seen pulmonary edema. No pleural effusion. No pneumothorax. No acute pleuroparenchymal abnormality. Visualized abdomen is unremarkable. Multilevel degenerative changes of  the thoracic spine. IMPRESSION: Resolution of previously seen pulmonary edema. No acute cardiopulmonary abnormality. Electronically Signed   By: Valentino Saxon MD   On: 12/16/2019 12:02   CT Head Wo Contrast  Result Date: 12/16/2019 CLINICAL DATA:  Polytrauma, critical, head/C-spine injury suspected Fall. EXAM: CT HEAD WITHOUT CONTRAST TECHNIQUE: Contiguous axial images were obtained from the base of the skull through the vertex without intravenous contrast. COMPARISON:  None. FINDINGS: Brain: Age related atrophy. No intracranial hemorrhage, mass effect, or midline shift. No hydrocephalus. The basilar cisterns are patent. Mild periventricular white matter hypodensity typical of chronic small vessel ischemia. No evidence of territorial infarct or acute ischemia. No extra-axial or intracranial fluid collection. Vascular: No hyperdense vessel. Skull: No fracture or focal lesion. Sinuses/Orbits: Minor mucosal thickening of ethmoid air cells. Paranasal sinuses are otherwise clear. No acute findings. Included orbits are unremarkable. Other: None. IMPRESSION: 1. No acute intracranial abnormality. No skull fracture. 2. Age related atrophy and chronic small vessel ischemia. Electronically Signed   By: Keith Rake M.D.   On: 12/16/2019 19:34   MR BRAIN WO CONTRAST  Result Date: 12/16/2019 CLINICAL DATA:  69 year old male with increased falls and dizziness x1 week. Headache and nausea. EXAM: MRI HEAD WITHOUT CONTRAST TECHNIQUE: Multiplanar, multiecho pulse sequences of the brain and surrounding structures were obtained without intravenous contrast. COMPARISON:  Head CT earlier today. FINDINGS: Brain: No restricted diffusion to suggest acute infarction. No midline shift, mass effect, evidence of mass lesion, ventriculomegaly, extra-axial collection or acute intracranial hemorrhage. Cervicomedullary junction and pituitary are within normal limits. Widely scattered and patchy bilateral cerebral white matter T2  and FLAIR hyperintensity, moderate for age but in a nonspecific configuration. No cortical encephalomalacia or chronic cerebral blood products identified. Mild T2 heterogeneity in the left  lentiform. Faint T2 heterogeneity in the right pons. Otherwise the deep gray nuclei, brainstem, and cerebellum are normal for age. Vascular: Major intracranial vascular flow voids are preserved. Skull and upper cervical spine: Negative visible cervical spine. Visualized bone marrow signal is within normal limits. Sinuses/Orbits: Negative. Other: Visible internal auditory structures appear grossly normal. Mastoids are clear. Stylomastoid foramina are within normal limits. Scalp and face soft tissues appear negative. IMPRESSION: 1. No acute intracranial abnormality. 2. Moderate for age cerebral white matter signal changes, with mild signal changes in the left basal ganglia and the pons. Favor chronic small vessel disease. Electronically Signed   By: Genevie Ann M.D.   On: 12/16/2019 21:16   MR Cervical Spine Wo Contrast  Result Date: 12/16/2019 CLINICAL DATA:  69 year old male with increased falls and dizziness x1 week. Headache and nausea. EXAM: MRI CERVICAL SPINE WITHOUT CONTRAST TECHNIQUE: Multiplanar, multisequence MR imaging of the cervical spine was performed. No intravenous contrast was administered. COMPARISON:  Brain MRI today reported separately. FINDINGS: Alignment: Straightening of cervical lordosis. Subtle anterolisthesis at the cervicothoracic junction and in the visible upper thoracic spine appears degenerative. Vertebrae: No marrow edema or evidence of acute osseous abnormality. Chronic degenerative endplate marrow signal changes. Normal background bone marrow signal. Cord: No spinal cord signal abnormality despite multilevel spinal stenosis with cord mass effect. Posterior Fossa, vertebral arteries, paraspinal tissues: Cervicomedullary junction is within normal limits. Brain detailed separately today. Preserved  major vascular flow voids in the neck. Negative visible neck soft tissues and lung apices. Disc levels: C2-C3: Foraminal disc osteophyte complex and mild to moderate facet and ligament flavum hypertrophy greater on the left. No significant spinal stenosis. But moderate to severe left and mild to moderate right C3 foraminal stenosis. C3-C4: Disc space loss with circumferential disc osteophyte complex and moderate to severe posterior element hypertrophy greater on the left. Mild spinal stenosis. Mild if any cord mass effect. Severe left greater than right C4 foraminal stenosis. C4-C5: Disc space loss with circumferential disc osteophyte complex. Moderate posterior element hypertrophy. Spinal stenosis with mild spinal cord mass effect. Moderate to severe bilateral C5 foraminal stenosis. C5-C6: Disc space loss with right eccentric circumferential disc osteophyte complex. Mild facet and ligament flavum hypertrophy. Mild spinal stenosis and mild to moderate right hemi cord mass effect. Moderate left and severe right C6 foraminal stenosis. C6-C7: Circumferential disc osteophyte complex with mild posterior element hypertrophy. No significant spinal stenosis. Mild to moderate left and moderate to severe right C7 foraminal stenosis. C7-T1: Mild anterolisthesis with endplate spurring and moderate posterior element hypertrophy. No spinal stenosis. Moderate left and mild right C8 foraminal stenosis. Upper thoracic spine degeneration but no upper thoracic spinal stenosis. There is moderate to severe bilateral T1 foraminal stenosis in part due to facet hypertrophy. IMPRESSION: 1. Widespread cervical spine degeneration. No acute osseous abnormality. 2. Multifactorial spinal stenosis with spinal cord mass effect most pronounced at C5-C6, but no spinal cord signal abnormality. 3. Moderate or severe degenerative neural foraminal stenosis at the left C3, bilateral C4 through C6, right C7, left C8, and bilateral T1 nerve levels.  Electronically Signed   By: Genevie Ann M.D.   On: 12/16/2019 21:22    Procedures Procedures (including critical care time)  Medications Ordered in ED Medications  sodium chloride flush (NS) 0.9 % injection 3 mL (3 mLs Intravenous Given 12/16/19 1844)  lactated ringers bolus 500 mL (0 mLs Intravenous Stopped 12/16/19 1954)  prochlorperazine (COMPAZINE) injection 10 mg (10 mg Intravenous Given 12/16/19 1843)  ondansetron (ZOFRAN)  injection 4 mg (4 mg Intravenous Given 12/16/19 1843)  diphenhydrAMINE (BENADRYL) injection 25 mg (25 mg Intravenous Given 12/16/19 1842)    ED Course  I have reviewed the triage vital signs and the nursing notes.  Pertinent labs & imaging results that were available during my care of the patient were reviewed by me and considered in my medical decision making (see chart for details).    MDM Rules/Calculators/A&P                           Patient is a 69 year old male with past medical history significant for HTN, HLD, COPD, DM type II, BPH,andprostate cancer who presents with nausea, headache, confusion following fall on Friday (3 days prior). Patient states he has had multiple recent mechanical falls. Patient states he tripped while on his deck, hitting his head. Denies LOC. However endorses worsening headache, nausea since his event. Patient also endorses concern for worsening confusion over the past several months. Patient also endorses chest pressure. Chart review shows inpatient mission approximately 6 months prior for NSTEMI (cardiac catheterization completed wo evidence of occlusions) and COPD exacerbation. On arrival patient afebrile, HDS, NAD. On exam patient with clear lungs in all fields. No cardiac murmur appreciated. No abdominal tenderness noted. Trace pitting edema to bilateral lower extremities. No overlying rash. Patient oriented, appears at baseline. Cranial nerves II-XII grossly intact. Intact coordination. Due to reported recent fall with worsening  headache, confusion will undergo CT head without. Initial labs completed in triage reviewed. Specifically no evidence of leukocytosis, no anemia. BMP significant for mild hypercalcemia of 11.1, however do not believe this would be high enough to because of concern for confusion. Due to chest pressure, delta troponin sent and wnl. BNP 148 which is not consistent with CHF exacerbation as chart review demonstrates BNP >1k 9 mo prior during exacerbation. UA wo evidence of infection. CXR completed without acute cardiopulmonary abnormality, specifically no pulmonary edema, evidence of volume overload. EKG without ST/T wave changes concerning for acute ischemia, full interpretation above.  In setting of acute headache, migraine cocktail including Compazine, Benadryl, Zofran given with 500 cc bolus LR.  Although patient with history of CHF believe patient may be overdiuresis in setting of decreased p.o. intake, torsemide use at home.  CT head without acute intracranial abnormalities.  Due to concern for weakness of right lower extremity versus left on physical exam and in setting of concern for confusion, headache, nausea we will proceed with MRI brain.  Patient states he has also had some neck pain following fall in setting of known degenerative disease so MRI C-spine was also completed.  MRI imaging of brain/C-spine were without acute traumatic injury, intracranial findings.  Specifically no evidence of acute ischemic stroke.  On reassessment patient with improvement of headache following completion of medication/fluids.  Based on work-up do not believe acute condition exists that requires further work-up or management in the ED emergency department setting.  Based on the above findings, I believe patient is hemodynamically stable for discharge. Feel patient would benefit from outpatient neurology follow-up.  Patient states he already has PCP followed up for Thursday of this week.  As patient is a [place] patient he will  discussed neurology follow-up with PCP on Thursday.  Ambulatory referral was placed to [facility] neurology if patient is unable to see provider in [place] system.  Information was provided in AVS.   Patient educated about specific return precautions for given chief complaint and symptoms.  Patient educated about follow-up with PCP and Neurology. Patient expressed understanding of return precautions and need for follow-up. Patient discharged.  All radiology and laboratory studies reviewed independently and with my attending physician, agree with reading provided by radiologist unless otherwise noted.       Final Clinical Impression(s) / ED Diagnoses Final diagnoses:  Weakness  Acute nonintractable headache, unspecified headache type  Nausea    Rx / DC Orders ED Discharge Orders         Ordered    Ambulatory referral to Neurology     Discontinue  Reprint    Comments: An appointment is requested in approximately: 2 weeks   12/16/19 2159           Kennyth Lose, MD 12/17/19 Claude Manges, DO 12/17/19 1332

## 2020-04-30 DIAGNOSIS — M130 Polyarthritis, unspecified: Secondary | ICD-10-CM | POA: Insufficient documentation

## 2020-06-05 ENCOUNTER — Emergency Department (HOSPITAL_COMMUNITY): Payer: No Typology Code available for payment source

## 2020-06-05 ENCOUNTER — Emergency Department (HOSPITAL_COMMUNITY)
Admission: EM | Admit: 2020-06-05 | Discharge: 2020-06-05 | Disposition: A | Discharge status: Home / Self Care | Payer: No Typology Code available for payment source | Attending: Emergency Medicine | Admitting: Emergency Medicine

## 2020-06-05 ENCOUNTER — Encounter (HOSPITAL_COMMUNITY): Payer: Self-pay | Admitting: *Deleted

## 2020-06-05 ENCOUNTER — Other Ambulatory Visit: Payer: Self-pay

## 2020-06-05 DIAGNOSIS — E119 Type 2 diabetes mellitus without complications: Secondary | ICD-10-CM | POA: Diagnosis not present

## 2020-06-05 DIAGNOSIS — I5023 Acute on chronic systolic (congestive) heart failure: Secondary | ICD-10-CM | POA: Diagnosis not present

## 2020-06-05 DIAGNOSIS — F1010 Alcohol abuse, uncomplicated: Secondary | ICD-10-CM | POA: Insufficient documentation

## 2020-06-05 DIAGNOSIS — J449 Chronic obstructive pulmonary disease, unspecified: Secondary | ICD-10-CM | POA: Diagnosis not present

## 2020-06-05 DIAGNOSIS — E871 Hypo-osmolality and hyponatremia: Secondary | ICD-10-CM | POA: Diagnosis not present

## 2020-06-05 DIAGNOSIS — Z8546 Personal history of malignant neoplasm of prostate: Secondary | ICD-10-CM | POA: Diagnosis not present

## 2020-06-05 DIAGNOSIS — R251 Tremor, unspecified: Secondary | ICD-10-CM | POA: Diagnosis present

## 2020-06-05 DIAGNOSIS — I110 Hypertensive heart disease with heart failure: Secondary | ICD-10-CM | POA: Diagnosis not present

## 2020-06-05 DIAGNOSIS — Y902 Blood alcohol level of 40-59 mg/100 ml: Secondary | ICD-10-CM | POA: Diagnosis not present

## 2020-06-05 DIAGNOSIS — Z7982 Long term (current) use of aspirin: Secondary | ICD-10-CM | POA: Diagnosis not present

## 2020-06-05 DIAGNOSIS — Z79899 Other long term (current) drug therapy: Secondary | ICD-10-CM | POA: Diagnosis not present

## 2020-06-05 DIAGNOSIS — Z87891 Personal history of nicotine dependence: Secondary | ICD-10-CM | POA: Diagnosis not present

## 2020-06-05 LAB — CBC WITH DIFFERENTIAL/PLATELET
Abs Immature Granulocytes: 0.05 10*3/uL (ref 0.00–0.07)
Basophils Absolute: 0 10*3/uL (ref 0.0–0.1)
Basophils Relative: 1 %
Eosinophils Absolute: 0.1 10*3/uL (ref 0.0–0.5)
Eosinophils Relative: 2 %
HCT: 33.5 % — ABNORMAL LOW (ref 39.0–52.0)
Hemoglobin: 11.3 g/dL — ABNORMAL LOW (ref 13.0–17.0)
Immature Granulocytes: 1 %
Lymphocytes Relative: 16 %
Lymphs Abs: 1.1 10*3/uL (ref 0.7–4.0)
MCH: 31.3 pg (ref 26.0–34.0)
MCHC: 33.7 g/dL (ref 30.0–36.0)
MCV: 92.8 fL (ref 80.0–100.0)
Monocytes Absolute: 0.4 10*3/uL (ref 0.1–1.0)
Monocytes Relative: 5 %
Neutro Abs: 5.3 10*3/uL (ref 1.7–7.7)
Neutrophils Relative %: 75 %
Platelets: 180 10*3/uL (ref 150–400)
RBC: 3.61 MIL/uL — ABNORMAL LOW (ref 4.22–5.81)
RDW: 13.2 % (ref 11.5–15.5)
WBC: 7 10*3/uL (ref 4.0–10.5)
nRBC: 0 % (ref 0.0–0.2)

## 2020-06-05 LAB — COMPREHENSIVE METABOLIC PANEL
ALT: 22 U/L (ref 0–44)
AST: 19 U/L (ref 15–41)
Albumin: 4.5 g/dL (ref 3.5–5.0)
Alkaline Phosphatase: 74 U/L (ref 38–126)
Anion gap: 11 (ref 5–15)
BUN: 13 mg/dL (ref 8–23)
CO2: 24 mmol/L (ref 22–32)
Calcium: 10.3 mg/dL (ref 8.9–10.3)
Chloride: 92 mmol/L — ABNORMAL LOW (ref 98–111)
Creatinine, Ser: 0.82 mg/dL (ref 0.61–1.24)
GFR, Estimated: >60 mL/min (ref >60)
Glucose, Bld: 114 mg/dL — ABNORMAL HIGH (ref 70–99)
Potassium: 4 mmol/L (ref 3.5–5.1)
Sodium: 127 mmol/L — ABNORMAL LOW (ref 135–145)
Total Bilirubin: 0.6 mg/dL (ref 0.3–1.2)
Total Protein: 7.6 g/dL (ref 6.5–8.1)

## 2020-06-05 LAB — PROTIME-INR
INR: 1 (ref 0.8–1.2)
Prothrombin Time: 12.6 seconds (ref 11.4–15.2)

## 2020-06-05 LAB — ETHANOL: Alcohol, Ethyl (B): 44 mg/dL — ABNORMAL HIGH (ref <10)

## 2020-06-05 MED ORDER — FOLIC ACID 1 MG PO TABS
1.0000 mg | ORAL_TABLET | Freq: Once | ORAL | Status: AC
  Administered 2020-06-05: 1 mg via ORAL
  Filled 2020-06-05: qty 1

## 2020-06-05 MED ORDER — THIAMINE HCL 100 MG PO TABS: 100.0000 mg | ORAL_TABLET | Freq: Every day | ORAL | 0 refills | Status: DC

## 2020-06-05 MED ORDER — SODIUM CHLORIDE 0.9 % IV BOLUS (SEPSIS)
500.0000 mL | Freq: Once | INTRAVENOUS | Status: AC
  Administered 2020-06-05: 500 mL via INTRAVENOUS

## 2020-06-05 MED ORDER — CHLORDIAZEPOXIDE HCL 25 MG PO CAPS: ORAL_CAPSULE | ORAL | 0 refills | Status: DC

## 2020-06-05 MED ORDER — THIAMINE HCL 100 MG/ML IJ SOLN
100.0000 mg | Freq: Once | INTRAMUSCULAR | Status: AC
  Administered 2020-06-05: 100 mg via INTRAVENOUS
  Filled 2020-06-05: qty 2

## 2020-06-05 NOTE — ED Triage Notes (Addendum)
BIB EMS due to [facility] detox.  Pt has drank 8 beers today usually drinks 18 per day. 168/66-70-99% CBG 113

## 2020-06-05 NOTE — Discharge Instructions (Addendum)
Try wearing compression stockings to help with the leg swelling.  Decrease your alcohol/beer consumption as we discussed.  Follow-up with your doctor to have your sodium level rechecked.  Continue to have salt in your diet

## 2020-06-05 NOTE — ED Provider Notes (Signed)
[place] DEPT Provider Note   CSN: [id number] Arrival date & time: 06/05/20  1156     History Chief complaint: Unsteadiness, visual disturbance  Francis Cardenas is a 70 y.o. male.  HPI   Patient states he chronically drinks alcohol.  Usually drinks about 18 beers a day.  Patient did drink 8 beers this morning.  Patient states he has been trying to cut back on his alcohol use.  Patient is followed at the [place] and states he is not aware of any specific complications associated with his drinking.  Patient states in the last few days he has started to feel more shaky and tremulous.  He is feeling more unsteady.  He started to notice visual disturbance where the straight lines on the beaded board in his bathroom are wavering.  Patient was also noticed increased leg swelling.  He denies any recent falls.  He is not having any headache.  He denies any vomiting or diarrhea.  No focal numbness or weakness.  No trouble with his speech.  Past Medical History:  Diagnosis Date  . BPH (benign prostatic hyperplasia)   . Cancer [facility])    prostate  . COPD (chronic obstructive pulmonary disease) ([place])   . Diabetes mellitus without complication ([place])   . Hypercholesteremia   . Hypertension     Patient Active Problem List   Diagnosis Date Noted  . Hypertensive urgency 04/06/2019  . Elevated troponin 04/06/2019  . Community acquired pneumonia   . COPD with acute exacerbation ([place]) 12/12/2018  . Morbid obesity ([place]) 12/12/2018  . Chronic diastolic CHF (congestive heart failure) ([place]) 12/10/2018  . Fall 12/10/2018  . Multiple rib fractures 12/10/2018  . OSA on CPAP 12/10/2018  . Hypokalemia 12/10/2018  . Acute on chronic systolic CHF (congestive heart failure) ([place])   . Class 2 severe obesity due to excess calories with serious comorbidity and body mass index (BMI) of 37.0 to 37.9 in adult [facility])   . CHF (congestive heart failure) ([place]) 01/28/2018  . Acute CHF  (congestive heart failure) ([place]) 12/31/2017  . Acute respiratory failure with hypoxia ([place]) 12/30/2017  . Chest pain syndrome 12/30/2017  . Carcinoma of prostate ([place]) 08/02/2016  . COPD (chronic obstructive pulmonary disease) ([place]) 08/02/2016  . GERD (gastroesophageal reflux disease) 08/02/2016  . Hyperlipidemia 08/02/2016  . Peripheral neuropathy 08/02/2016  . Steatosis of liver 08/02/2016  . Diabetes mellitus type 2 in obese ([place]) 08/02/2016  . Benign essential hypertension 06/11/2013  . Alcohol dependence ([place]) 03/26/2013  . PTSD (post-traumatic stress disorder) 03/26/2013  . Alcohol abuse 03/24/2013  . Depressive disorder 03/24/2013    Past Surgical History:  Procedure Laterality Date  . ACHILLES TENDON REPAIR Left   . KNEE ARTHROSCOPY Left   . RIGHT/LEFT HEART CATH AND CORONARY ANGIOGRAPHY N/A 04/09/2019   Procedure: RIGHT/LEFT HEART CATH AND CORONARY ANGIOGRAPHY;  Surgeon: Martinique, Peter M, MD;  Location: [facility] [facility];  Service: Cardiovascular;  Laterality: N/A;  . TONSILLECTOMY    . WRIST FRACTURE SURGERY Left        Family History  Problem Relation Age of Onset  . CAD Mother   . Leukemia Mother   . Heart attack Father   . Heart attack Brother     Social History   Tobacco Use  . Smoking status: Former Research scientist (life sciences)  . Smokeless tobacco: Never Used  Substance Use Topics  . Alcohol use: Yes    Alcohol/week: 18.0 standard drinks    Types: 18 Cans of  beer per week    Comment: 18 cans beer/day  . Drug use: No    Home Medications Prior to Admission medications   Medication Sig Start Date End Date Taking? Authorizing Provider  chlordiazePOXIDE (LIBRIUM) 25 MG capsule 50mg  PO TID x 1D, then 25-50mg  PO BID X 1D, then 25-50mg  PO QD X 1D 06/05/20  Yes [name], MD  thiamine 100 MG tablet Take 1 tablet (100 mg total) by mouth daily. 06/05/20  Yes [name], MD  albuterol (PROVENTIL HFA;VENTOLIN HFA) 108 (90 BASE) MCG/ACT inhaler Inhale 2 puffs into the lungs every 6  (six) hours as needed for wheezing.    [provider]  ALPRAZolam Duanne Moron) 1 MG tablet Take 1 mg by mouth at bedtime as needed for anxiety or sleep.  12/05/18   [provider]  amLODipine (NORVASC) 10 MG tablet Take 10 mg by mouth daily with breakfast.     [provider]  aspirin EC 81 MG tablet Take 81 mg by mouth daily with breakfast.     [provider]  atorvastatin (LIPITOR) 80 MG tablet Take 40 mg by mouth daily.     [provider]  budesonide-formoterol (SYMBICORT) 80-4.5 MCG/ACT inhaler Inhale 2 puffs into the lungs daily.     [provider]  Carboxymethylcellulose Sodium 0.25 % SOLN Place 1 drop into both eyes 4 (four) times daily as needed (dry eyes/ irritation).    [provider]  carvedilol (COREG) 25 MG tablet Take 1 tablet (25 mg total) by mouth 2 (two) times daily with a meal. For hypertension. 03/31/13   Ruben Im, PA-C  cholecalciferol (VITAMIN D) 1000 units tablet Take 1,000 Units by mouth daily with supper.     [provider]  diclofenac sodium (VOLTAREN) 1 % GEL Apply 1 application topically 2 (two) times daily as needed (athritis pain).  10/12/18   [provider]  Ensure Max Protein (ENSURE MAX PROTEIN) LIQD Take 330 mLs (11 oz total) by mouth 2 (two) times daily. Patient taking differently: Take 11 oz by mouth 2 (two) times daily as needed (meal supplement).  12/12/18   [name], [name], MD  gabapentin (NEURONTIN) 300 MG capsule Take 300 mg by mouth 3 (three) times daily.    [provider]  hydrALAZINE (APRESOLINE) 25 MG tablet Take 1 tablet (25 mg total) by mouth every 8 (eight) hours. Patient taking differently: Take 25 mg by mouth daily.  04/10/19 12/21/19  Donne Hazel, MD  lisinopril (PRINIVIL,ZESTRIL) 40 MG tablet Take 1 tablet (40 mg total) by mouth daily. For hypertension. Patient taking differently: Take 40 mg by mouth daily with breakfast. For hypertension. 03/31/13    Nena Polio T, PA-C  magnesium oxide (MAG-OX) 400 MG tablet Take 1 tablet (400 mg total) by mouth daily. Patient taking differently: Take 400 mg by mouth daily with supper.  01/31/18   Barton Dubois, MD  meloxicam (MOBIC) 15 MG tablet Take 15 mg by mouth at bedtime. 10/31/18   [provider]  Multiple Vitamins-Minerals (MULTIVITAMIN WITH MINERALS) tablet Take 1 tablet by mouth daily with lunch.     [provider]  omeprazole (PRILOSEC) 40 MG capsule Take 40 mg by mouth daily with supper.     [provider]  potassium chloride SA (K-DUR,KLOR-CON) 20 MEQ tablet Take 2 tablets (40 mEq total) by mouth daily. Patient taking differently: Take 40 mEq by mouth daily with supper.  01/31/18   Barton Dubois, MD  PRESCRIPTION MEDICATION Inhale into  the lungs at bedtime. CPAP    [provider]  sertraline (ZOLOFT) 100 MG tablet Take one pill by mouth each day for depression and anxiety. Patient not taking: Reported on 12/16/2019 03/31/13   Ruben Im, PA-C  spironolactone (ALDACTONE) 25 MG tablet Take 1 tablet (25 mg total) by mouth daily. 04/11/19 12/21/19  Donne Hazel, MD  tiotropium (SPIRIVA HANDIHALER) 18 MCG inhalation capsule Place 1 capsule (18 mcg total) into inhaler and inhale daily. Patient not taking: Reported on 03/16/2019 12/12/18 12/12/19  [name], [name], MD  torsemide (DEMADEX) 20 MG tablet Take 1 tablet (20 mg total) by mouth 2 (two) times daily. 04/10/19 12/21/19  Donne Hazel, MD  vitamin B-12 (CYANOCOBALAMIN) 1000 MCG tablet Take 1,000 mcg by mouth daily with supper.     [provider]    Allergies    Patient has no known allergies.  Review of Systems   Review of Systems  All other systems reviewed and are negative.   Physical Exam Updated Vital Signs BP (!) 136/56 (BP Location: Left Arm)   Pulse (!) 59   Temp 98.2 F (36.8 C) (Oral)   Resp 18   Ht 1.778 m (5\' 10" )   Wt 136.1 kg   SpO2 96%   BMI 43.05 kg/m    Physical Exam Vitals and nursing note reviewed.  Constitutional:      General: He is not in acute distress.    Appearance: He is well-developed and well-nourished.  HENT:     Head: Normocephalic and atraumatic.     Right Ear: External ear normal.     Left Ear: External ear normal.  Eyes:     General: No visual field deficit or scleral icterus.       Right eye: No discharge.        Left eye: No discharge.     Conjunctiva/sclera: Conjunctivae normal.  Neck:     Trachea: No tracheal deviation.  Cardiovascular:     Rate and Rhythm: Normal rate and regular rhythm.     Pulses: Intact distal pulses.  Pulmonary:     Effort: Pulmonary effort is normal. No respiratory distress.     Breath sounds: Normal breath sounds. No stridor. No wheezing or rales.  Abdominal:     General: Bowel sounds are normal. There is no distension.     Palpations: Abdomen is soft.     Tenderness: There is no abdominal tenderness. There is no guarding or rebound.  Musculoskeletal:        General: No tenderness.     Cervical back: Neck supple.     Right lower leg: Edema present.     Left lower leg: Edema present.  Skin:    General: Skin is warm and dry.     Findings: No rash.  Neurological:     Mental Status: He is alert.     Cranial Nerves: No cranial nerve deficit (no facial droop, extraocular movements intact, no slurred speech) or dysarthria.     Sensory: No sensory deficit.     Motor: Tremor present. No abnormal muscle tone or seizure activity.     Deep Tendon Reflexes: Strength normal.  Psychiatric:        Mood and Affect: Mood and affect normal.     ED Results / Procedures / Treatments   Labs (all labs ordered are listed, but only abnormal results are displayed) Labs Reviewed  CBC WITH DIFFERENTIAL/PLATELET - Abnormal; Notable for the following components:  Result Value   RBC 3.61 (*)    Hemoglobin 11.3 (*)    HCT 33.5 (*)    All other components within normal limits  COMPREHENSIVE  METABOLIC PANEL - Abnormal; Notable for the following components:   Sodium 127 (*)    Chloride 92 (*)    Glucose, Bld 114 (*)    All other components within normal limits  ETHANOL - Abnormal; Notable for the following components:   Alcohol, Ethyl (B) 44 (*)    All other components within normal limits  PROTIME-INR    EKG None  Radiology CT Head Wo Contrast  Result Date: 06/05/2020 CLINICAL DATA:  Acute neurologic deficit. Alcohol intoxication. No reported injury. EXAM: CT HEAD WITHOUT CONTRAST TECHNIQUE: Contiguous axial images were obtained from the base of the skull through the vertex without intravenous contrast. COMPARISON:  12/16/2019 head CT. FINDINGS: Brain: Generalized cerebral volume loss. No evidence of parenchymal hemorrhage or extra-axial fluid collection. No mass lesion, mass effect, or midline shift. No CT evidence of acute infarction. Nonspecific mild subcortical and periventricular white matter hypodensity, most in keeping with chronic small vessel ischemic change. No ventriculomegaly. Vascular: No acute abnormality. Skull: No evidence of calvarial fracture. Sinuses/Orbits: The visualized paranasal sinuses are essentially clear. Other:  The mastoid air cells are unopacified. IMPRESSION: 1. No evidence of acute intracranial abnormality. 2. Generalized cerebral volume loss and mild chronic small vessel ischemic changes in the cerebral white matter. Electronically Signed   By: Ilona Sorrel M.D.   On: 06/05/2020 14:25    Procedures Procedures (including critical care time)  Medications Ordered in ED Medications  sodium chloride 0.9 % bolus 500 mL (500 mLs Intravenous New Bag/Given 06/05/20 1344)  thiamine (B-1) injection 100 mg (100 mg Intravenous Given [id number])  folic acid (FOLVITE) tablet 1 mg (1 mg Oral Given 06/05/20 1344)    ED Course  I have reviewed the triage vital signs and the nursing notes.  Pertinent labs & imaging results that were available during my care  of the patient were reviewed by me and considered in my medical decision making (see chart for details).  Clinical Course as of 06/05/20 1523  Fri Jun 05, 2020  1310 Pedal edema, mild tremor [JK]  1448 Alcohol level only mildly elevated.  Labs notable for hyponatremia.  Decreased from 5 months ago. [id number] Head CT without acute findings [JK]    Clinical Course User Index [JK] [name], MD   MDM Rules/Calculators/A&P                          Patient's ED work-up is notable for hyponatremia.  His alcohol levels only slightly elevated.  CT scan does not show evidence of any acute abnormality.  No signs of subdural hematoma or stroke.  I suspect the patient's symptoms may be related to his alcohol consumption and possible vitamin deficiency.  Patient does have hyponatremia associated with his beer intake.  This is likely contributing to his leg swelling as well.  Patient is not having any breathing issues to suggest congestive heart failure or pulm edema.  Some of the patient's tremor and visual complaints may be related to his decreased alcohol consumption.  He states he has had more severe withdrawal symptoms in the past when he cut back completely.  We will have him continue to try to decrease his alcohol consumption over period of time.  I will give him Librium to help with some of  these withdrawal symptoms.  Discussed outpatient follow-up with his primary care doctor to recheck on his sodium level but this should hopefully correct as he decreases his beer consumption. Final Clinical Impression(s) / ED Diagnoses Final diagnoses:  Alcohol abuse  Hyponatremia    Rx / DC Orders ED Discharge Orders         Ordered    chlordiazePOXIDE (LIBRIUM) 25 MG capsule        06/05/20 1521    thiamine 100 MG tablet  Daily        06/05/20 1521           [name], MD 06/05/20 1523

## 2021-02-19 ENCOUNTER — Other Ambulatory Visit: Payer: Self-pay

## 2021-02-19 ENCOUNTER — Emergency Department (HOSPITAL_BASED_OUTPATIENT_CLINIC_OR_DEPARTMENT_OTHER)
Admission: EM | Admit: 2021-02-19 | Discharge: 2021-02-19 | Disposition: A | Discharge status: Home / Self Care | Payer: No Typology Code available for payment source | Attending: Emergency Medicine | Admitting: Emergency Medicine

## 2021-02-19 ENCOUNTER — Emergency Department (HOSPITAL_BASED_OUTPATIENT_CLINIC_OR_DEPARTMENT_OTHER): Payer: No Typology Code available for payment source

## 2021-02-19 ENCOUNTER — Encounter (HOSPITAL_BASED_OUTPATIENT_CLINIC_OR_DEPARTMENT_OTHER): Payer: Self-pay

## 2021-02-19 DIAGNOSIS — E119 Type 2 diabetes mellitus without complications: Secondary | ICD-10-CM | POA: Diagnosis not present

## 2021-02-19 DIAGNOSIS — M25551 Pain in right hip: Secondary | ICD-10-CM | POA: Insufficient documentation

## 2021-02-19 DIAGNOSIS — L309 Dermatitis, unspecified: Secondary | ICD-10-CM | POA: Insufficient documentation

## 2021-02-19 DIAGNOSIS — I5023 Acute on chronic systolic (congestive) heart failure: Secondary | ICD-10-CM | POA: Insufficient documentation

## 2021-02-19 DIAGNOSIS — Z87891 Personal history of nicotine dependence: Secondary | ICD-10-CM | POA: Diagnosis not present

## 2021-02-19 DIAGNOSIS — Z7982 Long term (current) use of aspirin: Secondary | ICD-10-CM | POA: Diagnosis not present

## 2021-02-19 DIAGNOSIS — I110 Hypertensive heart disease with heart failure: Secondary | ICD-10-CM | POA: Insufficient documentation

## 2021-02-19 DIAGNOSIS — Z79899 Other long term (current) drug therapy: Secondary | ICD-10-CM | POA: Insufficient documentation

## 2021-02-19 DIAGNOSIS — M25552 Pain in left hip: Secondary | ICD-10-CM | POA: Diagnosis not present

## 2021-02-19 DIAGNOSIS — B372 Candidiasis of skin and nail: Secondary | ICD-10-CM

## 2021-02-19 DIAGNOSIS — J441 Chronic obstructive pulmonary disease with (acute) exacerbation: Secondary | ICD-10-CM | POA: Diagnosis not present

## 2021-02-19 DIAGNOSIS — Z8546 Personal history of malignant neoplasm of prostate: Secondary | ICD-10-CM | POA: Insufficient documentation

## 2021-02-19 DIAGNOSIS — M48062 Spinal stenosis, lumbar region with neurogenic claudication: Secondary | ICD-10-CM

## 2021-02-19 DIAGNOSIS — M545 Low back pain, unspecified: Secondary | ICD-10-CM | POA: Diagnosis present

## 2021-02-19 DIAGNOSIS — Z7951 Long term (current) use of inhaled steroids: Secondary | ICD-10-CM | POA: Diagnosis not present

## 2021-02-19 DIAGNOSIS — G8929 Other chronic pain: Secondary | ICD-10-CM | POA: Insufficient documentation

## 2021-02-19 DIAGNOSIS — M5136 Other intervertebral disc degeneration, lumbar region: Secondary | ICD-10-CM

## 2021-02-19 MED ORDER — KETOROLAC TROMETHAMINE 15 MG/ML IJ SOLN
30.0000 mg | Freq: Once | INTRAMUSCULAR | Status: AC
  Administered 2021-02-19: 30 mg via INTRAMUSCULAR
  Filled 2021-02-19: qty 2

## 2021-02-19 MED ORDER — NYSTATIN 100000 UNIT/GM EX POWD
Freq: Once | CUTANEOUS | Status: AC
  Administered 2021-02-19: 1 via TOPICAL
  Filled 2021-02-19: qty 15

## 2021-02-19 MED ORDER — NYSTATIN 100000 UNIT/GM EX CREA: TOPICAL_CREAM | CUTANEOUS | 0 refills | Status: DC

## 2021-02-19 MED ORDER — CYCLOBENZAPRINE HCL 5 MG PO TABS
5.0000 mg | ORAL_TABLET | Freq: Once | ORAL | Status: AC
  Administered 2021-02-19: 5 mg via ORAL
  Filled 2021-02-19: qty 1

## 2021-02-19 NOTE — Discharge Instructions (Addendum)
Your CT scan of the pelvis did not reveal any fractures. Your CT of the lumbar spine was also negative for fractures but did reveal persistent multilevel degenerative disc disease. Recommend you follow-up at the [facility] and with a spinal surgeon to discuss definitive management outpatient. Recommend Nystatin cream for your right groin. Continue your outpatient pain regimen.  Disc levels: L1-2: Negative     L2-3: Disc bulging and facet degeneration. Mild spinal stenosis.  Moderate left subarticular stenosis.     L3-4: Diffuse disc bulging and severe facet degeneration. Moderate  spinal stenosis and moderate subarticular stenosis bilaterally.     L4-5: Moderate to advanced disc degeneration with disc space  narrowing and diffuse endplate spurring. Mild facet hypertrophy.  Mild spinal stenosis and mild subarticular stenosis bilaterally     L5-S1: Advanced disc degeneration with marked disc space narrowing  and diffuse endplate spurring. Moderate subarticular and foraminal  stenosis bilaterally right greater than left due to spurring.     IMPRESSION:  Multilevel lumbar degenerative change with spinal and foraminal  stenosis as described above     Negative for lumbar fracture.

## 2021-02-19 NOTE — ED Provider Notes (Signed)
[facility] EMERGENCY DEPT Provider Note   CSN: [id number] Arrival date & time: 02/19/21  6295     History Chief Complaint  Patient presents with   Back Pain    Francis Cardenas is a 70 y.o. male.   Back Pain Location:  Lumbar spine Quality:  Aching and shooting Pain severity:  Moderate Duration:  3 weeks Timing:  Constant Progression:  Unchanged Chronicity:  Chronic Context: falling   Relieved by:  Bed rest Worsened by:  Nothing Associated symptoms: bowel incontinence and pelvic pain   Associated symptoms: no abdominal pain, no chest pain, no dysuria, no fever, no tingling and no weakness    Patient is a 70 year old male who presents emergency department with acute on chronic back pain.  He states that he sustained a mechanical fall 3 weeks ago.  He was seen at [facility] where x-ray imaging of the pelvis and spine were negative.  He states that he has had worsening radicular pain and is concerned due to bilateral hip pain.  He is worried he may have a pelvic fracture.  He states that he has had increasing difficulty with ambulation.  He utilizes a walker for ambulation.  He states that he follows with the [facility] and there are plans to build a ramp at his home.  [facility] states [facility] and is a 100% disabled veteran.  He states that he has chronic bowel incontinence.  He denies any new symptoms of lower extremity weakness.  Denies any urinary incontinence.  Denies any fevers or chills.  Past Medical History:  Diagnosis Date   BPH (benign prostatic hyperplasia)    Cancer (HCC)    prostate   COPD (chronic obstructive pulmonary disease) ([place])    Diabetes mellitus without complication ([place])    Hypercholesteremia    Hypertension     Patient Active Problem List   Diagnosis Date Noted   Hypertensive urgency 04/06/2019   Elevated troponin 04/06/2019   Community acquired pneumonia    COPD with acute exacerbation ([place])  12/12/2018   Morbid obesity ([place]) 12/12/2018   Chronic diastolic CHF (congestive heart failure) ([place]) 12/10/2018   Fall 12/10/2018   Multiple rib fractures 12/10/2018   OSA on CPAP 12/10/2018   Hypokalemia 12/10/2018   Acute on chronic systolic CHF (congestive heart failure) ([place])    Class 2 severe obesity due to excess calories with serious comorbidity and body mass index (BMI) of 37.0 to 37.9 in adult [facility]    CHF (congestive heart failure) ([place]) 01/28/2018   Acute CHF (congestive heart failure) ([place]) 12/31/2017   Acute respiratory failure with hypoxia ([place]) 12/30/2017   Chest pain syndrome 12/30/2017   Carcinoma of prostate ([place]) 08/02/2016   COPD (chronic obstructive pulmonary disease) ([place]) 08/02/2016   GERD (gastroesophageal reflux disease) 08/02/2016   Hyperlipidemia 08/02/2016   Peripheral neuropathy 08/02/2016   Steatosis of liver 08/02/2016   Diabetes mellitus type 2 in obese ([place]) 08/02/2016   Benign essential hypertension 06/11/2013   Alcohol dependence ([place]) 03/26/2013   PTSD (post-traumatic stress disorder) 03/26/2013   Alcohol abuse 03/24/2013   Depressive disorder 03/24/2013    Past Surgical History:  Procedure Laterality Date   ACHILLES TENDON REPAIR Left    KNEE ARTHROSCOPY Left    RIGHT/LEFT HEART CATH AND CORONARY ANGIOGRAPHY N/A 04/09/2019   Procedure: RIGHT/LEFT HEART CATH AND CORONARY ANGIOGRAPHY;  Surgeon: Martinique, Peter M, MD;  Location: [facility] [place] [facility] LAB;  Service: Cardiovascular;  Laterality: N/A;  TONSILLECTOMY     WRIST FRACTURE SURGERY Left        Family History  Problem Relation Age of Onset   CAD Mother    Leukemia Mother    Heart attack Father    Heart attack Brother     Social History   Tobacco Use   Smoking status: Former   Smokeless tobacco: Never  Scientific laboratory technician Use: Never used  Substance Use Topics   Alcohol use: Yes    Alcohol/week: 18.0 standard drinks    Types: 18 Cans of beer per week    Comment: 18 cans  beer/day   Drug use: No    Home Medications Prior to Admission medications   Medication Sig Start Date End Date Taking? Authorizing Provider  nystatin cream (MYCOSTATIN) Apply to affected area 2 times daily 02/19/21  Yes Regan Lemming, MD  albuterol (PROVENTIL HFA;VENTOLIN HFA) 108 (90 BASE) MCG/ACT inhaler Inhale 2 puffs into the lungs every 6 (six) hours as needed for wheezing.    [provider]  ALPRAZolam Duanne Moron) 1 MG tablet Take 1 mg by mouth at bedtime as needed for anxiety or sleep.  12/05/18   [provider]  amLODipine (NORVASC) 10 MG tablet Take 10 mg by mouth daily with breakfast.     [provider]  aspirin EC 81 MG tablet Take 81 mg by mouth daily with breakfast.     [provider]  atorvastatin (LIPITOR) 80 MG tablet Take 40 mg by mouth daily.     [provider]  budesonide-formoterol (SYMBICORT) 80-4.5 MCG/ACT inhaler Inhale 2 puffs into the lungs daily.     [provider]  Carboxymethylcellulose Sodium 0.25 % SOLN Place 1 drop into both eyes 4 (four) times daily as needed (dry eyes/ irritation).    [provider]  carvedilol (COREG) 25 MG tablet Take 1 tablet (25 mg total) by mouth 2 (two) times daily with a meal. For hypertension. 03/31/13   Ruben Im, PA-C  chlordiazePOXIDE (LIBRIUM) 25 MG capsule 50mg  PO TID x 1D, then 25-50mg  PO BID X 1D, then 25-50mg  PO QD X 1D 06/05/20   [name], MD  cholecalciferol (VITAMIN D) 1000 units tablet Take 1,000 Units by mouth daily with supper.     [provider]  diclofenac sodium (VOLTAREN) 1 % GEL Apply 1 application topically 2 (two) times daily as needed (athritis pain).  10/12/18   [provider]  Ensure Max Protein (ENSURE MAX PROTEIN) LIQD Take 330 mLs (11 oz total) by mouth 2 (two) times daily. Patient taking differently: Take 11 oz by mouth 2 (two) times daily as needed (meal supplement).  12/12/18   [name], [name], MD  gabapentin  (NEURONTIN) 300 MG capsule Take 300 mg by mouth 3 (three) times daily.    [provider]  hydrALAZINE (APRESOLINE) 25 MG tablet Take 1 tablet (25 mg total) by mouth every 8 (eight) hours. Patient taking differently: Take 25 mg by mouth daily.  04/10/19 12/21/19  Donne Hazel, MD  lisinopril (PRINIVIL,ZESTRIL) 40 MG tablet Take 1 tablet (40 mg total) by mouth daily. For hypertension. Patient taking differently: Take 40 mg by mouth daily with breakfast. For hypertension. 03/31/13   Nena Polio T, PA-C  magnesium oxide (MAG-OX) 400 MG tablet Take 1 tablet (400 mg total) by mouth daily. Patient taking differently: Take 400 mg by mouth daily with supper.  01/31/18   Barton Dubois, MD  meloxicam (MOBIC) 15 MG tablet Take 15  mg by mouth at bedtime. 10/31/18   [provider]  Multiple Vitamins-Minerals (MULTIVITAMIN WITH MINERALS) tablet Take 1 tablet by mouth daily with lunch.     [provider]  omeprazole (PRILOSEC) 40 MG capsule Take 40 mg by mouth daily with supper.     [provider]  potassium chloride SA (K-DUR,KLOR-CON) 20 MEQ tablet Take 2 tablets (40 mEq total) by mouth daily. Patient taking differently: Take 40 mEq by mouth daily with supper.  01/31/18   Barton Dubois, MD  PRESCRIPTION MEDICATION Inhale into the lungs at bedtime. CPAP    [provider]  sertraline (ZOLOFT) 100 MG tablet Take one pill by mouth each day for depression and anxiety. Patient not taking: Reported on 12/16/2019 03/31/13   Ruben Im, PA-C  spironolactone (ALDACTONE) 25 MG tablet Take 1 tablet (25 mg total) by mouth daily. 04/11/19 12/21/19  Donne Hazel, MD  thiamine 100 MG tablet Take 1 tablet (100 mg total) by mouth daily. 06/05/20   [name], MD  tiotropium (SPIRIVA HANDIHALER) 18 MCG inhalation capsule Place 1 capsule (18 mcg total) into inhaler and inhale daily. Patient not taking: Reported on 03/16/2019 12/12/18 12/12/19  [name], [name], MD  torsemide  (DEMADEX) 20 MG tablet Take 1 tablet (20 mg total) by mouth 2 (two) times daily. 04/10/19 12/21/19  Donne Hazel, MD  vitamin B-12 (CYANOCOBALAMIN) 1000 MCG tablet Take 1,000 mcg by mouth daily with supper.     [provider]    Allergies    Patient has no known allergies.  Review of Systems   Review of Systems  Constitutional:  Negative for chills and fever.  HENT:  Negative for ear pain and sore throat.   Eyes:  Negative for pain and visual disturbance.  Respiratory:  Negative for cough and shortness of breath.   Cardiovascular:  Negative for chest pain and palpitations.  Gastrointestinal:  Positive for bowel incontinence. Negative for abdominal pain and vomiting.  Genitourinary:  Positive for pelvic pain. Negative for dysuria and hematuria.  Musculoskeletal:  Positive for back pain. Negative for arthralgias.  Skin:  Negative for color change and rash.  Neurological:  Negative for tingling, seizures, syncope and weakness.  All other systems reviewed and are negative.  Physical Exam Updated Vital Signs BP (!) 176/82 (BP Location: Right Arm)   Pulse 78   Temp 98.4 F (36.9 C) (Oral)   Resp 15   Ht 5\' 10"  (1.778 m)   Wt 132.5 kg   SpO2 100%   BMI 41.90 kg/m   Physical Exam Vitals and nursing note reviewed.  Constitutional:      Appearance: He is well-developed.  HENT:     Head: Normocephalic and atraumatic.  Eyes:     Conjunctiva/sclera: Conjunctivae normal.  Cardiovascular:     Rate and Rhythm: Normal rate and regular rhythm.     Heart sounds: No murmur heard. Pulmonary:     Effort: Pulmonary effort is normal. No respiratory distress.     Breath sounds: Normal breath sounds.  Abdominal:     Palpations: Abdomen is soft.     Tenderness: There is no abdominal tenderness.  Musculoskeletal:     Cervical back: Neck supple.     Comments: Bilateral tenderness to palpation of the hips and pelvis with intact range of motion, limited by his radicular back pain.   Positive straight leg raise test bilaterally.    Skin:    General: Skin is warm and dry.  Comments: Area of erythema along the patient's right inguinal region  Neurological:     Mental Status: He is alert.     Comments: 4-5 LE strength bilaterally, at baseline for the patient. No sensory deficit.    ED Results / Procedures / Treatments   Labs (all labs ordered are listed, but only abnormal results are displayed) Labs Reviewed - No data to display  EKG None  Radiology CT Lumbar Spine Wo Contrast  Result Date: 02/19/2021 CLINICAL DATA:  Lumbar radiculopathy. Chronic back pain. Fall 3 weeks ago. EXAM: CT LUMBAR SPINE WITHOUT CONTRAST TECHNIQUE: Multidetector CT imaging of the lumbar spine was performed without intravenous contrast administration. Multiplanar CT image reconstructions were also generated. COMPARISON:  None. FINDINGS: Segmentation: Normal Alignment: Normal Vertebrae: Negative for fracture or mass Paraspinal and other soft tissues: Negative for paraspinous mass or edema. Mild atherosclerotic aorta. Disc levels: L1-2: Negative L2-3: Disc bulging and facet degeneration. Mild spinal stenosis. Moderate left subarticular stenosis. L3-4: Diffuse disc bulging and severe facet degeneration. Moderate spinal stenosis and moderate subarticular stenosis bilaterally. L4-5: Moderate to advanced disc degeneration with disc space narrowing and diffuse endplate spurring. Mild facet hypertrophy. Mild spinal stenosis and mild subarticular stenosis bilaterally L5-S1: Advanced disc degeneration with marked disc space narrowing and diffuse endplate spurring. Moderate subarticular and foraminal stenosis bilaterally right greater than left due to spurring. IMPRESSION: Multilevel lumbar degenerative change with spinal and foraminal stenosis as described above Negative for lumbar fracture. Electronically Signed   By: Franchot Gallo M.D.   On: 02/19/2021 11:10   CT PELVIS WO CONTRAST  Result Date:  02/19/2021 CLINICAL DATA:  Pelvic trauma. Fall 3 weeks ago with bilateral hip and lower back pain EXAM: CT PELVIS WITHOUT CONTRAST TECHNIQUE: Multidetector CT imaging of the pelvis was performed following the standard protocol without intravenous contrast. COMPARISON:  None. FINDINGS: No acute fracture, hip dislocation, or pelvic ring diastasis. Lower lumbar spine degeneration that is advanced and described on dedicated study. No incidental bone lesion. No visible soft tissue or visceral injury. IMPRESSION: No acute finding. Severe lumbar spine degeneration, reference dedicated CT. Electronically Signed   By: Jorje Guild M.D.   On: 02/19/2021 11:22    Procedures Procedures   Medications Ordered in ED Medications  nystatin (MYCOSTATIN/NYSTOP) topical powder (1 application Topical Given 02/19/21 1110)  ketorolac (TORADOL) 15 MG/ML injection 30 mg (30 mg Intramuscular Given 02/19/21 1107)  cyclobenzaprine (FLEXERIL) tablet 5 mg (5 mg Oral Given 02/19/21 1106)    ED Course  I have reviewed the triage vital signs and the nursing notes.  Pertinent labs & imaging results that were available during my care of the patient were reviewed by me and considered in my medical decision making (see chart for details).    MDM Rules/Calculators/A&P                           70 year old male with a history of severe spinal stenosis and chronic lower back pain presenting to the emergency department with acute on chronic back pain after fall 3 weeks ago.  Patient was seen outpatient by [facility] where x-ray imaging was negative but endorses persistent symptoms since his presentation to the emergency department today.  On exam, the patient appears to be at his baseline.  Bilateral tenderness to palpation of the hips, positive straight leg raise test bilaterally, 4/5 strength.  Due to subacute nature of symptoms and physical exam at baseline, I do not think emergent MRI  imaging is warranted at this time.  Patient  had a rash along his inguinal crease concerning for candidal infection.  Nystatin powder applied.  Patient was administered Toradol and Flexeril for his back pain with symptomatic relief noted.  CT imaging to evaluate for occult fracture performed which revealed no acute findings, severe lumbar spinal degeneration noted on lumbar CT.  Patient was informed of the findings.  Ambulatory with assistance and at his baseline with symptomatic improvement.  Given the chronicity of his symptoms, favor continued follow-up at the [facility] for his severe spinal stenosis. Stable for DC.   Final Clinical Impression(s) / ED Diagnoses Final diagnoses:  Spinal stenosis of lumbar region with neurogenic claudication  Degenerative disc disease, lumbar  Yeast dermatitis    Rx / DC Orders ED Discharge Orders          Ordered    nystatin cream (MYCOSTATIN)        02/19/21 1135             Regan Lemming, MD 02/20/21 1453

## 2021-02-19 NOTE — ED Triage Notes (Signed)
Chronic back pain but fell 3 weeks ago and complains of bilateral hip pain and lower back pain.  Reports bowel incontinence but has had it for years.

## 2021-03-09 ENCOUNTER — Encounter: Payer: Self-pay | Admitting: Neurology

## 2021-05-14 ENCOUNTER — Inpatient Hospital Stay (HOSPITAL_BASED_OUTPATIENT_CLINIC_OR_DEPARTMENT_OTHER)
Admission: EM | Admit: 2021-05-14 | Discharge: 2021-05-22 | DRG: 291 | Disposition: A | Discharge status: Skilled Nursing Facility | Payer: No Typology Code available for payment source | Attending: Internal Medicine | Admitting: Internal Medicine

## 2021-05-14 ENCOUNTER — Encounter (HOSPITAL_BASED_OUTPATIENT_CLINIC_OR_DEPARTMENT_OTHER): Payer: Self-pay | Admitting: Emergency Medicine

## 2021-05-14 ENCOUNTER — Other Ambulatory Visit: Payer: Self-pay

## 2021-05-14 ENCOUNTER — Emergency Department (HOSPITAL_BASED_OUTPATIENT_CLINIC_OR_DEPARTMENT_OTHER): Payer: No Typology Code available for payment source | Admitting: Radiology

## 2021-05-14 DIAGNOSIS — I4891 Unspecified atrial fibrillation: Secondary | ICD-10-CM

## 2021-05-14 DIAGNOSIS — J449 Chronic obstructive pulmonary disease, unspecified: Secondary | ICD-10-CM | POA: Diagnosis present

## 2021-05-14 DIAGNOSIS — Z888 Allergy status to other drugs, medicaments and biological substances status: Secondary | ICD-10-CM

## 2021-05-14 DIAGNOSIS — E1122 Type 2 diabetes mellitus with diabetic chronic kidney disease: Secondary | ICD-10-CM | POA: Diagnosis present

## 2021-05-14 DIAGNOSIS — Z6841 Body Mass Index (BMI) 40.0 and over, adult: Secondary | ICD-10-CM

## 2021-05-14 DIAGNOSIS — R509 Fever, unspecified: Secondary | ICD-10-CM | POA: Diagnosis not present

## 2021-05-14 DIAGNOSIS — J4489 Other specified chronic obstructive pulmonary disease: Secondary | ICD-10-CM | POA: Diagnosis present

## 2021-05-14 DIAGNOSIS — K219 Gastro-esophageal reflux disease without esophagitis: Secondary | ICD-10-CM | POA: Diagnosis present

## 2021-05-14 DIAGNOSIS — Z7951 Long term (current) use of inhaled steroids: Secondary | ICD-10-CM

## 2021-05-14 DIAGNOSIS — E1165 Type 2 diabetes mellitus with hyperglycemia: Secondary | ICD-10-CM | POA: Diagnosis not present

## 2021-05-14 DIAGNOSIS — R0602 Shortness of breath: Secondary | ICD-10-CM | POA: Diagnosis present

## 2021-05-14 DIAGNOSIS — I5033 Acute on chronic diastolic (congestive) heart failure: Secondary | ICD-10-CM | POA: Diagnosis present

## 2021-05-14 DIAGNOSIS — E861 Hypovolemia: Secondary | ICD-10-CM | POA: Diagnosis not present

## 2021-05-14 DIAGNOSIS — G4733 Obstructive sleep apnea (adult) (pediatric): Secondary | ICD-10-CM

## 2021-05-14 DIAGNOSIS — F1010 Alcohol abuse, uncomplicated: Secondary | ICD-10-CM | POA: Diagnosis present

## 2021-05-14 DIAGNOSIS — I4819 Other persistent atrial fibrillation: Secondary | ICD-10-CM | POA: Diagnosis present

## 2021-05-14 DIAGNOSIS — I10 Essential (primary) hypertension: Secondary | ICD-10-CM | POA: Diagnosis present

## 2021-05-14 DIAGNOSIS — J439 Emphysema, unspecified: Secondary | ICD-10-CM | POA: Diagnosis not present

## 2021-05-14 DIAGNOSIS — E1169 Type 2 diabetes mellitus with other specified complication: Secondary | ICD-10-CM | POA: Diagnosis present

## 2021-05-14 DIAGNOSIS — I509 Heart failure, unspecified: Secondary | ICD-10-CM

## 2021-05-14 DIAGNOSIS — Z8249 Family history of ischemic heart disease and other diseases of the circulatory system: Secondary | ICD-10-CM

## 2021-05-14 DIAGNOSIS — E871 Hypo-osmolality and hyponatremia: Secondary | ICD-10-CM | POA: Diagnosis present

## 2021-05-14 DIAGNOSIS — R609 Edema, unspecified: Secondary | ICD-10-CM | POA: Diagnosis not present

## 2021-05-14 DIAGNOSIS — I130 Hypertensive heart and chronic kidney disease with heart failure and stage 1 through stage 4 chronic kidney disease, or unspecified chronic kidney disease: Secondary | ICD-10-CM | POA: Diagnosis present

## 2021-05-14 DIAGNOSIS — Z7982 Long term (current) use of aspirin: Secondary | ICD-10-CM

## 2021-05-14 DIAGNOSIS — E119 Type 2 diabetes mellitus without complications: Secondary | ICD-10-CM | POA: Diagnosis present

## 2021-05-14 DIAGNOSIS — Z791 Long term (current) use of non-steroidal anti-inflammatories (NSAID): Secondary | ICD-10-CM

## 2021-05-14 DIAGNOSIS — D649 Anemia, unspecified: Secondary | ICD-10-CM | POA: Diagnosis present

## 2021-05-14 DIAGNOSIS — J431 Panlobular emphysema: Secondary | ICD-10-CM | POA: Diagnosis not present

## 2021-05-14 DIAGNOSIS — T380X5A Adverse effect of glucocorticoids and synthetic analogues, initial encounter: Secondary | ICD-10-CM | POA: Diagnosis not present

## 2021-05-14 DIAGNOSIS — E7800 Pure hypercholesterolemia, unspecified: Secondary | ICD-10-CM | POA: Diagnosis present

## 2021-05-14 DIAGNOSIS — E669 Obesity, unspecified: Secondary | ICD-10-CM | POA: Diagnosis present

## 2021-05-14 DIAGNOSIS — B952 Enterococcus as the cause of diseases classified elsewhere: Secondary | ICD-10-CM | POA: Diagnosis not present

## 2021-05-14 DIAGNOSIS — Z20822 Contact with and (suspected) exposure to covid-19: Secondary | ICD-10-CM | POA: Diagnosis present

## 2021-05-14 DIAGNOSIS — I272 Pulmonary hypertension, unspecified: Secondary | ICD-10-CM | POA: Diagnosis present

## 2021-05-14 DIAGNOSIS — Z9989 Dependence on other enabling machines and devices: Secondary | ICD-10-CM

## 2021-05-14 DIAGNOSIS — N1831 Chronic kidney disease, stage 3a: Secondary | ICD-10-CM | POA: Diagnosis present

## 2021-05-14 DIAGNOSIS — M79669 Pain in unspecified lower leg: Secondary | ICD-10-CM | POA: Diagnosis not present

## 2021-05-14 DIAGNOSIS — Z87891 Personal history of nicotine dependence: Secondary | ICD-10-CM

## 2021-05-14 DIAGNOSIS — I5031 Acute diastolic (congestive) heart failure: Secondary | ICD-10-CM | POA: Diagnosis not present

## 2021-05-14 DIAGNOSIS — L89322 Pressure ulcer of left buttock, stage 2: Secondary | ICD-10-CM | POA: Diagnosis present

## 2021-05-14 DIAGNOSIS — N390 Urinary tract infection, site not specified: Secondary | ICD-10-CM | POA: Diagnosis not present

## 2021-05-14 DIAGNOSIS — Z806 Family history of leukemia: Secondary | ICD-10-CM

## 2021-05-14 DIAGNOSIS — C61 Malignant neoplasm of prostate: Secondary | ICD-10-CM | POA: Insufficient documentation

## 2021-05-14 DIAGNOSIS — Z8546 Personal history of malignant neoplasm of prostate: Secondary | ICD-10-CM

## 2021-05-14 DIAGNOSIS — E875 Hyperkalemia: Secondary | ICD-10-CM | POA: Diagnosis present

## 2021-05-14 DIAGNOSIS — N400 Enlarged prostate without lower urinary tract symptoms: Secondary | ICD-10-CM | POA: Diagnosis present

## 2021-05-14 DIAGNOSIS — Z79899 Other long term (current) drug therapy: Secondary | ICD-10-CM

## 2021-05-14 DIAGNOSIS — M109 Gout, unspecified: Secondary | ICD-10-CM | POA: Diagnosis present

## 2021-05-14 DIAGNOSIS — R079 Chest pain, unspecified: Secondary | ICD-10-CM | POA: Diagnosis present

## 2021-05-14 DIAGNOSIS — L8990 Pressure ulcer of unspecified site, unspecified stage: Secondary | ICD-10-CM | POA: Insufficient documentation

## 2021-05-14 LAB — MRSA NEXT GEN BY PCR, NASAL: MRSA by PCR Next Gen: NOT DETECTED

## 2021-05-14 LAB — BASIC METABOLIC PANEL
Anion gap: 7 (ref 5–15)
BUN: 31 mg/dL — ABNORMAL HIGH (ref 8–23)
CO2: 24 mmol/L (ref 22–32)
Calcium: 10.4 mg/dL — ABNORMAL HIGH (ref 8.9–10.3)
Chloride: 98 mmol/L (ref 98–111)
Creatinine, Ser: 1.57 mg/dL — ABNORMAL HIGH (ref 0.61–1.24)
GFR, Estimated: 47 mL/min — ABNORMAL LOW (ref >60)
Glucose, Bld: 119 mg/dL — ABNORMAL HIGH (ref 70–99)
Potassium: 5.2 mmol/L — ABNORMAL HIGH (ref 3.5–5.1)
Sodium: 129 mmol/L — ABNORMAL LOW (ref 135–145)

## 2021-05-14 LAB — CBC
HCT: 31.6 % — ABNORMAL LOW (ref 39.0–52.0)
Hemoglobin: 10.3 g/dL — ABNORMAL LOW (ref 13.0–17.0)
MCH: 30.6 pg (ref 26.0–34.0)
MCHC: 32.6 g/dL (ref 30.0–36.0)
MCV: 93.8 fL (ref 80.0–100.0)
Platelets: 191 10*3/uL (ref 150–400)
RBC: 3.37 MIL/uL — ABNORMAL LOW (ref 4.22–5.81)
RDW: 14.4 % (ref 11.5–15.5)
WBC: 8.3 10*3/uL (ref 4.0–10.5)
nRBC: 0 % (ref 0.0–0.2)

## 2021-05-14 LAB — TROPONIN I (HIGH SENSITIVITY)
Troponin I (High Sensitivity): 13 ng/L (ref <18)
Troponin I (High Sensitivity): 14 ng/L (ref <18)

## 2021-05-14 LAB — MAGNESIUM: Magnesium: 2 mg/dL (ref 1.7–2.4)

## 2021-05-14 LAB — RESP PANEL BY RT-PCR (FLU A&B, COVID) ARPGX2
Influenza A by PCR: NEGATIVE
Influenza B by PCR: NEGATIVE
SARS Coronavirus 2 by RT PCR: NEGATIVE

## 2021-05-14 LAB — BRAIN NATRIURETIC PEPTIDE: B Natriuretic Peptide: 458.2 pg/mL — ABNORMAL HIGH (ref 0.0–100.0)

## 2021-05-14 MED ORDER — SODIUM CHLORIDE 0.9 % IV SOLN: 250.0000 mL | INTRAVENOUS | Status: DC | PRN

## 2021-05-14 MED ORDER — ATORVASTATIN CALCIUM 40 MG PO TABS
40.0000 mg | ORAL_TABLET | Freq: Every day | ORAL | Status: DC
  Administered 2021-05-14 – 2021-05-22 (×9): 40 mg via ORAL
  Filled 2021-05-14 (×9): qty 1

## 2021-05-14 MED ORDER — HEPARIN (PORCINE) 25000 UT/250ML-% IV SOLN
1900.0000 [IU]/h | INTRAVENOUS | Status: DC
  Administered 2021-05-14: 21:00:00 1500 [IU]/h via INTRAVENOUS
  Administered 2021-05-15 – 2021-05-16 (×3): 1750 [IU]/h via INTRAVENOUS
  Administered 2021-05-17 (×2): 1900 [IU]/h via INTRAVENOUS
  Filled 2021-05-14 (×6): qty 250

## 2021-05-14 MED ORDER — SODIUM CHLORIDE 0.9 % IV BOLUS
500.0000 mL | Freq: Once | INTRAVENOUS | Status: AC
  Administered 2021-05-14: 10:00:00 500 mL via INTRAVENOUS

## 2021-05-14 MED ORDER — GABAPENTIN 300 MG PO CAPS
300.0000 mg | ORAL_CAPSULE | Freq: Two times a day (BID) | ORAL | Status: DC
  Administered 2021-05-15 – 2021-05-22 (×15): 300 mg via ORAL
  Filled 2021-05-14 (×15): qty 1

## 2021-05-14 MED ORDER — ASPIRIN EC 81 MG PO TBEC
81.0000 mg | DELAYED_RELEASE_TABLET | Freq: Every day | ORAL | Status: DC
  Administered 2021-05-15 – 2021-05-22 (×8): 81 mg via ORAL
  Filled 2021-05-14 (×8): qty 1

## 2021-05-14 MED ORDER — THIAMINE HCL 100 MG PO TABS
100.0000 mg | ORAL_TABLET | Freq: Every day | ORAL | Status: DC
  Administered 2021-05-14 – 2021-05-22 (×9): 100 mg via ORAL
  Filled 2021-05-14 (×9): qty 1

## 2021-05-14 MED ORDER — MOMETASONE FURO-FORMOTEROL FUM 100-5 MCG/ACT IN AERO
2.0000 | INHALATION_SPRAY | Freq: Two times a day (BID) | RESPIRATORY_TRACT | Status: DC
  Administered 2021-05-14 – 2021-05-22 (×15): 2 via RESPIRATORY_TRACT
  Filled 2021-05-14: qty 8.8

## 2021-05-14 MED ORDER — CARVEDILOL 12.5 MG PO TABS
12.5000 mg | ORAL_TABLET | Freq: Two times a day (BID) | ORAL | Status: DC
  Administered 2021-05-15 – 2021-05-18 (×8): 12.5 mg via ORAL
  Filled 2021-05-14 (×9): qty 1

## 2021-05-14 MED ORDER — ADULT MULTIVITAMIN W/MINERALS CH
1.0000 | ORAL_TABLET | Freq: Every day | ORAL | Status: DC
  Administered 2021-05-14 – 2021-05-22 (×9): 1 via ORAL
  Filled 2021-05-14 (×9): qty 1

## 2021-05-14 MED ORDER — FUROSEMIDE 10 MG/ML IJ SOLN
20.0000 mg | Freq: Once | INTRAMUSCULAR | Status: AC
  Administered 2021-05-14: 10:00:00 20 mg via INTRAVENOUS
  Filled 2021-05-14: qty 2

## 2021-05-14 MED ORDER — ONDANSETRON HCL 4 MG/2ML IJ SOLN
4.0000 mg | Freq: Four times a day (QID) | INTRAMUSCULAR | Status: DC | PRN
  Administered 2021-05-15: 12:00:00 4 mg via INTRAVENOUS
  Filled 2021-05-14: qty 2

## 2021-05-14 MED ORDER — SODIUM CHLORIDE 0.9% FLUSH
3.0000 mL | Freq: Two times a day (BID) | INTRAVENOUS | Status: DC
  Administered 2021-05-14 – 2021-05-21 (×11): 3 mL via INTRAVENOUS

## 2021-05-14 MED ORDER — LORAZEPAM 2 MG/ML IJ SOLN: 1.0000 mg | INTRAMUSCULAR | Status: AC | PRN

## 2021-05-14 MED ORDER — LORAZEPAM 1 MG PO TABS: 1.0000 mg | ORAL_TABLET | ORAL | Status: AC | PRN

## 2021-05-14 MED ORDER — HEPARIN BOLUS VIA INFUSION
4000.0000 [IU] | Freq: Once | INTRAVENOUS | Status: AC
  Administered 2021-05-14: 21:00:00 4000 [IU] via INTRAVENOUS
  Filled 2021-05-14: qty 4000

## 2021-05-14 MED ORDER — IPRATROPIUM-ALBUTEROL 0.5-2.5 (3) MG/3ML IN SOLN
3.0000 mL | Freq: Once | RESPIRATORY_TRACT | Status: AC
  Administered 2021-05-14: 14:00:00 3 mL via RESPIRATORY_TRACT
  Filled 2021-05-14: qty 3

## 2021-05-14 MED ORDER — FOLIC ACID 1 MG PO TABS
1.0000 mg | ORAL_TABLET | Freq: Every day | ORAL | Status: DC
  Administered 2021-05-14 – 2021-05-22 (×9): 1 mg via ORAL
  Filled 2021-05-14 (×9): qty 1

## 2021-05-14 MED ORDER — SODIUM CHLORIDE 0.9% FLUSH
3.0000 mL | INTRAVENOUS | Status: DC | PRN
  Administered 2021-05-19: 17:00:00 3 mL via INTRAVENOUS

## 2021-05-14 MED ORDER — FUROSEMIDE 10 MG/ML IJ SOLN
40.0000 mg | Freq: Two times a day (BID) | INTRAMUSCULAR | Status: DC
  Administered 2021-05-14 – 2021-05-21 (×14): 40 mg via INTRAVENOUS
  Filled 2021-05-14 (×14): qty 4

## 2021-05-14 MED ORDER — ACETAMINOPHEN 325 MG PO TABS
650.0000 mg | ORAL_TABLET | ORAL | Status: DC | PRN
  Administered 2021-05-15: 11:00:00 650 mg via ORAL
  Filled 2021-05-14: qty 2

## 2021-05-14 MED ORDER — ALPRAZOLAM 0.5 MG PO TABS: 1.0000 mg | ORAL_TABLET | Freq: Every evening | ORAL | Status: DC | PRN

## 2021-05-14 MED ORDER — THIAMINE HCL 100 MG/ML IJ SOLN
100.0000 mg | Freq: Every day | INTRAMUSCULAR | Status: DC
  Filled 2021-05-14: qty 2

## 2021-05-14 MED ORDER — PANTOPRAZOLE SODIUM 40 MG PO TBEC
40.0000 mg | DELAYED_RELEASE_TABLET | Freq: Every day | ORAL | Status: DC
  Administered 2021-05-14 – 2021-05-22 (×9): 40 mg via ORAL
  Filled 2021-05-14 (×9): qty 1

## 2021-05-14 NOTE — ED Notes (Signed)
RT Note: Additional 12-Lead EKG obtained/exported/printed/labelled/given to Horton, Alvin Critchley, DO, Attending, [place].

## 2021-05-14 NOTE — Plan of Care (Signed)

## 2021-05-14 NOTE — H&P (Addendum)
[facility] History and Physical  ABDISHAKUR GOTTSCHALL WFU:[id number] DOB: September 02, 1950 DOA: 05/14/2021   PCP: Gerome Sam, MD  Specialists: Followed by cardiology at the [facility]  Chief Complaint: Shortness of breath  HPI: YER OLIVENCIA is a 70 y.o. male with a past medical history of chronic diastolic CHF, essential hypertension, morbid obesity, obstructive sleep apnea on CPAP who was in his usual state of health till about 2 months ago when he started noticing that he was gaining weight.  He weighed about 293 pounds 2 months ago.  Has been gaining weight steadily and now up to 320 pounds as of this morning.  He has been taking his medications including his diuretics without fail.  He denies any dietary noncompliance but has been drinking 8 cans of beer daily but this is a longstanding habit for him.  He used to drink 18 cans of beer on a daily basis about 2 years ago.  He did experience some chest pain in the left side of his chest next to his Zio patch that was applied by his primary care provider 2 weeks ago for newly detected atrial fibrillation.  Denies any chest pain currently.  No cough.  But he has been experiencing shortness of breath for the last 24 hours which was worse this morning.  He could not speak in complete sentences.  He was supposed to get a epidural spinal injection this morning but had to cancel that appointment and come to the emergency department.  He sleeps in a recliner.  Has experienced some symptoms suggestive of PND.  Has experienced significant edema in his lower extremity in the last couple of months.  No fever or chills.  In the emergency department he was noted to be in atrial fibrillation.  Saturations were normal on room air.  Blood work showed hyponatremia, mild hyperkalemia.  BNP was elevated.  It was thought to be in fluid overload.  He was given furosemide and the hospitalist was called for inpatient admission.  Home Medications: Prior to Admission  medications   Medication Sig Start Date End Date Taking? Authorizing Provider  albuterol (PROVENTIL HFA;VENTOLIN HFA) 108 (90 BASE) MCG/ACT inhaler Inhale 2 puffs into the lungs every 6 (six) hours as needed for wheezing.    [provider]  ALPRAZolam Duanne Moron) 1 MG tablet Take 1 mg by mouth at bedtime as needed for anxiety or sleep.  12/05/18   [provider]  amLODipine (NORVASC) 10 MG tablet Take 10 mg by mouth daily with breakfast.     [provider]  aspirin EC 81 MG tablet Take 81 mg by mouth daily with breakfast.     [provider]  atorvastatin (LIPITOR) 80 MG tablet Take 40 mg by mouth daily.     [provider]  budesonide-formoterol (SYMBICORT) 80-4.5 MCG/ACT inhaler Inhale 2 puffs into the lungs daily.     [provider]  Carboxymethylcellulose Sodium 0.25 % SOLN Place 1 drop into both eyes 4 (four) times daily as needed (dry eyes/ irritation).    [provider]  carvedilol (COREG) 25 MG tablet Take 1 tablet (25 mg total) by mouth 2 (two) times daily with a meal. For hypertension. 03/31/13   Ruben Im, PA-C  chlordiazePOXIDE (LIBRIUM) 25 MG capsule 50mg  PO TID x 1D, then 25-50mg  PO BID X 1D, then 25-50mg  PO QD X 1D 06/05/20   [name], MD  cholecalciferol (VITAMIN D) 1000 units tablet Take 1,000 Units by mouth daily with supper.  [provider]  diclofenac sodium (VOLTAREN) 1 % GEL Apply 1 application topically 2 (two) times daily as needed (athritis pain).  10/12/18   [provider]  Ensure Max Protein (ENSURE MAX PROTEIN) LIQD Take 330 mLs (11 oz total) by mouth 2 (two) times daily. Patient taking differently: Take 11 oz by mouth 2 (two) times daily as needed (meal supplement).  12/12/18   [name], [name], MD  gabapentin (NEURONTIN) 300 MG capsule Take 300 mg by mouth 3 (three) times daily.    [provider]  hydrALAZINE (APRESOLINE) 25 MG tablet Take 1 tablet (25 mg total) by mouth  every 8 (eight) hours. Patient taking differently: Take 25 mg by mouth daily.  04/10/19 12/21/19  Donne Hazel, MD  lisinopril (PRINIVIL,ZESTRIL) 40 MG tablet Take 1 tablet (40 mg total) by mouth daily. For hypertension. Patient taking differently: Take 40 mg by mouth daily with breakfast. For hypertension. 03/31/13   Nena Polio T, PA-C  magnesium oxide (MAG-OX) 400 MG tablet Take 1 tablet (400 mg total) by mouth daily. Patient taking differently: Take 400 mg by mouth daily with supper.  01/31/18   Barton Dubois, MD  meloxicam (MOBIC) 15 MG tablet Take 15 mg by mouth at bedtime. 10/31/18   [provider]  Multiple Vitamins-Minerals (MULTIVITAMIN WITH MINERALS) tablet Take 1 tablet by mouth daily with lunch.     [provider]  nystatin cream (MYCOSTATIN) Apply to affected area 2 times daily 02/19/21   Regan Lemming, MD  omeprazole (PRILOSEC) 40 MG capsule Take 40 mg by mouth daily with supper.     [provider]  potassium chloride SA (K-DUR,KLOR-CON) 20 MEQ tablet Take 2 tablets (40 mEq total) by mouth daily. Patient taking differently: Take 40 mEq by mouth daily with supper.  01/31/18   Barton Dubois, MD  PRESCRIPTION MEDICATION Inhale into the lungs at bedtime. CPAP    [provider]  sertraline (ZOLOFT) 100 MG tablet Take one pill by mouth each day for depression and anxiety. Patient not taking: Reported on 12/16/2019 03/31/13   Ruben Im, PA-C  spironolactone (ALDACTONE) 25 MG tablet Take 1 tablet (25 mg total) by mouth daily. 04/11/19 12/21/19  Donne Hazel, MD  thiamine 100 MG tablet Take 1 tablet (100 mg total) by mouth daily. 06/05/20   [name], MD  tiotropium (SPIRIVA HANDIHALER) 18 MCG inhalation capsule Place 1 capsule (18 mcg total) into inhaler and inhale daily. Patient not taking: Reported on 03/16/2019 12/12/18 12/12/19  [name], [name], MD  torsemide (DEMADEX) 20 MG tablet Take 1 tablet (20 mg total) by mouth 2 (two) times daily.  04/10/19 12/21/19  Donne Hazel, MD  vitamin B-12 (CYANOCOBALAMIN) 1000 MCG tablet Take 1,000 mcg by mouth daily with supper.     [provider]    Allergies:  Allergies  Allergen Reactions   Oxcarbazepine Other (See Comments)    Dizziness/ lightheaded   Zolpidem Nausea Only    Past Medical History: Past Medical History:  Diagnosis Date   BPH (benign prostatic hyperplasia)    Cancer (HCC)    prostate   COPD (chronic obstructive pulmonary disease) ([place])    Diabetes mellitus without complication ([place])    Hypercholesteremia    Hypertension     Past Surgical History:  Procedure Laterality Date   ACHILLES TENDON REPAIR Left    KNEE ARTHROSCOPY Left    RIGHT/LEFT HEART CATH AND CORONARY ANGIOGRAPHY N/A 04/09/2019   Procedure: RIGHT/LEFT HEART CATH AND CORONARY ANGIOGRAPHY;  Surgeon: Martinique, Peter M, MD;  Location: [place] [facility];  Service: Cardiovascular;  Laterality: N/A;   TONSILLECTOMY     WRIST FRACTURE SURGERY Left     Social History: Lives with his wife.  Not very active at baseline.  No history of smoking currently.  8 cans of beer on a daily basis.  Denies any liquor use or wine intake.  No illicit drug use.  Family History:  Family History  Problem Relation Age of Onset   CAD Mother    Leukemia Mother    Heart attack Father    Heart attack Brother      Review of Systems - History obtained from the patient General ROS: positive for  - fatigue Psychological ROS: negative Ophthalmic ROS: negative ENT ROS: negative Allergy and Immunology ROS: negative Hematological and Lymphatic ROS: negative Endocrine ROS: negative Respiratory ROS: As in HPI Cardiovascular ROS: As in HPI Gastrointestinal ROS: no abdominal pain, change in bowel habits, or black or bloody stools Genito-Urinary ROS: no dysuria, trouble voiding, or hematuria Musculoskeletal ROS: negative Neurological ROS: no TIA or stroke symptoms Dermatological ROS: negative  Physical  Examination  Vitals:   05/14/21 1430 05/14/21 1445 05/14/21 1500 05/14/21 1642  BP: (!) 134/52 (!) 159/61 (!) 163/73 (!) 159/49  Pulse: 62 60 63 61  Resp: 18 (!) 21 (!) 22 15  Temp:    98.2 F (36.8 C)  TempSrc:    Oral  SpO2: 96% 94% 96% 94%  Weight:      Height:        BP (!) 159/49 (BP Location: Left Arm)    Pulse 61    Temp 98.2 F (36.8 C) (Oral)    Resp 15    Ht 5\' 10"  (1.778 m)    Wt (!) 145.6 kg    SpO2 94%    BMI 46.06 kg/m   General appearance: alert, cooperative, appears stated age, and no distress Head: Normocephalic, without obvious abnormality, atraumatic Eyes: conjunctivae/corneas clear. PERRL, EOM's intact. Throat: lips, mucosa, and tongue normal; teeth and gums normal Neck: no adenopathy, no carotid bruit, no JVD, supple, symmetrical, trachea midline, and thyroid not enlarged, symmetric, no tenderness/mass/nodules Resp:  mildly tachypneic at rest.  Coarse breath sounds bilaterally with few crackles at the bases.  No wheezing or rhonchi. Chest wall: Zio patch is noted on his left chest. Cardio: S1-S2 is irregularly irregular.  No S3-S4.  No rubs or bruit.  No murmurs appreciated GI: soft, non-tender; bowel sounds normal; no masses,  no organomegaly Extremities: 2+ edema bilateral lower extremities Pulses: 2+ and symmetric Weakly palpable in the lower extremities due to his edema Skin: Skin color, texture, turgor normal. No rashes or lesions Lymph nodes: Cervical, supraclavicular, and axillary nodes normal. Neurologic: Alert and oriented x3.  No obvious focal neurological deficits.   Labs on Admission: I have personally reviewed following labs and imaging studies  CBC: Recent Labs  Lab 05/14/21 0619  WBC 8.3  HGB 10.3*  HCT 31.6*  MCV 93.8  PLT 093   Basic Metabolic Panel: Recent Labs  Lab 05/14/21 0619  NA 129*  K 5.2*  CL 98  CO2 24  GLUCOSE 119*  BUN 31*  CREATININE 1.57*  CALCIUM 10.4*  MG 2.0   GFR: Estimated Creatinine Clearance:  61.4 mL/min (A) (by C-G formula based on SCr of 1.57 mg/dL (H)).    Radiological Exams on Admission: DG Chest Port 1 View  Result Date: 05/14/2021 CLINICAL DATA:  Provided history: Shortness  of breath. EXAM: PORTABLE CHEST 1 VIEW COMPARISON:  Prior chest radiographs 12/16/2019 and earlier. FINDINGS: An electronic device projects over the left upper chest. Heart size within normal limits. Central pulmonary vascular congestion without overt pulmonary edema. No appreciable airspace consolidation. No evidence of pleural effusion or pneumothorax. No acute bony abnormality identified. IMPRESSION: Central pulmonary vascular congestion without overt pulmonary edema. No appreciable airspace consolidation. Electronically Signed   By: Kellie Simmering D.O.   On: 05/14/2021 08:07     My interpretation of Electrocardiogram: Atrial fibrillation in the 60s.  Normal axis.  Intervals appear to be normal.  No concerning ST or T wave changes noted.   Problem List  Principal Problem:   Acute diastolic CHF (congestive heart failure) (HCC) Active Problems:   Benign essential hypertension   COPD (chronic obstructive pulmonary disease) (HCC)   Diabetes mellitus type 2 in obese (HCC)   Chest pain syndrome   CHF (congestive heart failure) (HCC)   OSA on CPAP   Morbid obesity (HCC)   Unspecified atrial fibrillation (HCC)   Acute diastolic (congestive) heart failure (HCC)   Assessment: This is a 70 year old Caucasian male with past medical history as stated earlier who comes in with shortness of breath and significant weight gain over the past 2 months.  He appears to have acute on chronic diastolic CHF.  Also noted to be in atrial fibrillation which is a new diagnosis for him.  Plan:  #1 Acute on chronic diastolic CHF: Patient with significant weight gain of about 25 to 30 pounds in the last 2 months.  This is despite taking torsemide on a daily basis.  Care everywhere was reviewed.   It looks like he had an  echocardiogram on December 16 at the [place].  This revealed ejection fraction of greater than 55%.  Right ventricle was normal in size and function.  RV systolic pressure was 30-16 suggesting mild to moderate pulmonary hypertension.  Diastolic function was not well evaluated due to arrhythmia.  Left atrium was noted to be mildly dilated.  No pericardial effusion was noted.   Patient was given furosemide in the emergency department with good diuresis.  He is still significantly volume overloaded.  He will be placed on 40 mg of furosemide twice a day.  Monitor renal function closely.  Recheck labs tomorrow.  Strict ins and outs and daily weights.  No ACE inhibitor for now due to elevated creatinine.  We will readdress this in the next few days.  He is already on carvedilol which we will continue but at a lower dose since he appears to be bradycardic. Troponins are normal.  #2.  Atrial fibrillation, unspecified: It appears that this was diagnosed about 2 weeks ago by his primary care provider.  He has a Pharmacist, community.  EKG continues to show atrial fibrillation though rate appears to be controlled probably because he has been on carvedilol.  Will check TSH.  His CHADS2 vascular score is 2.  He is at high risk for embolic strokes.  This was discussed in detail with patient.  For now we will place him on IV heparin and then depending on his progress during this hospital will consider transitioning him to apixaban at the time of discharge.  #3 Hyponatremia: Likely due to volume overload.  Monitor sodium levels closely.  #4  Chronic kidney disease likely stage IIIa/hyperkalemia: His serum creatinine was 1.6 when it was checked at the [place] earlier this month.  Creatinine is 1.57 here at this time.  Monitor renal function closely while he is getting diuresed.  Potassium is mildly elevated.  Should improve with diuretics.  We will recheck tomorrow morning.  Hold ACE inhibitor and spironolactone.  #5 Essential hypertension:  Monitor blood pressures closely.  Continue beta-blocker and he will be on IV diuretics.  Will hold off on his other medications for now.  #6 Diabetes mellitus type 2 in obese: Patient mentions that he used to be on metformin but stopped taking it a while ago.  Mentions that his last A1c was around 5.5.  Will check his HbA1c tomorrow and glucose levels tomorrow morning.  We will hold off on SSI for now.  #7 Normocytic anemia: No evidence for overt blood loss.  We will check anemia panel.  Hemoglobin about the same as what it was when it was checked at the [place] a few weeks ago.  #8 Alcohol abuse: Drinks 8 cans of beers on a daily basis which is better from the 18 cans of beer he was having on a daily basis about 2 years ago.  Placed on CIWA protocol.  Thiamine multivitamins.  He was counseled about his alcohol consumption.  #9 Obstructive sleep apnea on CPAP: Continue with CPAP at nighttime.  #10 COPD: Appears to be stable.  Continue inhaled steroids.  #11  Morbid obesity: Estimated body mass index is 46.06 kg/m as calculated from the following:   Height as of this encounter: 5\' 10"  (1.778 m).   Weight as of this encounter: 145.6 kg.    DVT Prophylaxis: On IV heparin Code Status: Full code Family Communication: Discussed with the patient Disposition: Hopefully return home in improved Consults called: None Admission Status: Status is: Inpatient  Remains inpatient appropriate because: Acute diastolic CHF, atrial fibrillation    Severity of Illness: The appropriate patient status for this patient is INPATIENT. Inpatient status is judged to be reasonable and necessary in order to provide the required intensity of service to ensure the patient's safety. The patient's presenting symptoms, physical exam findings, and initial radiographic and laboratory data in the context of their chronic comorbidities is felt to place them at high risk for further clinical deterioration. Furthermore, it is not  anticipated that the patient will be medically stable for discharge from the hospital within 2 midnights of admission.   * I certify that at the point of admission it is my clinical judgment that the patient will require inpatient hospital care spanning beyond 2 midnights from the point of admission due to high intensity of service, high risk for further deterioration and high frequency of surveillance required.*   Further management decisions will depend on results of further testing and patient's response to treatment.   Yuval Nolet [employer]  [facility] operational officer on [email or web address]  05/14/2021, 5:44 PM

## 2021-05-14 NOTE — Progress Notes (Signed)
Received a phone call from Facility: [facility]  Requesting MD: Dr. Dina Rich Patient with h/o HTN, COPD, GERD, HLD, O1YW, systolic and diastolic CHF, OSA and hx of atrial fibrillation on no DOAC presenting with shortness of breath, weight gain 20-30+, LE edema found to be in acute on chronic CHF exacerbation.  CXR shows central pulmonary vascular congestion without overt pulmonary edema. Bnp 458 (was 253 at [facility] 12/13). Troponin wnl x2.   Vital stable, on room air. Ekg afib-no documentation of afib in [place] or PCP chart, but patient told EDP he has afib and sees cardiology with [facility].   Labs shows aki vs. CKD (creatinine of 1.6 at [place] on 12/13)  and sodium of 129. Given 67meq lasix, 500cc bolus and duoneb. He has had good urine output.   Plan of care: place in observation on cardiac telemetry  The patient will be accepted for admission to telemetry at [facility] when bed is available.    Nursing staff, Please call the [place] number at the top of Amion at the time of the patient's arrival so that the patient can be paged to the admitting physician.   Casimer Bilis, M.D. [facility]

## 2021-05-14 NOTE — Progress Notes (Signed)
ANTICOAGULATION CONSULT NOTE - Initial Consult  Pharmacy Consult for heparin Indication: atrial fibrillation  Allergies  Allergen Reactions   Oxcarbazepine Other (See Comments)    Dizziness/ lightheaded   Zolpidem Nausea Only    Patient Measurements: Height: 5\' 10"  (177.8 cm) Weight: (!) 145.6 kg (321 lb) IBW/kg (Calculated) : 73 Heparin Dosing Weight: 107.6 kg  Vital Signs: Temp: 98.2 F (36.8 C) (12/23 1642) Temp Source: Oral (12/23 1642) BP: 159/49 (12/23 1642) Pulse Rate: 61 (12/23 1642)  Labs: Recent Labs    05/14/21 0619 05/14/21 1010 05/14/21 1132  HGB 10.3*  --   --   HCT 31.6*  --   --   PLT 191  --   --   CREATININE 1.57*  --   --   TROPONINIHS  --  13 14    Estimated Creatinine Clearance: 61.4 mL/min (A) (by C-G formula based on SCr of 1.57 mg/dL (H)).   Medical History: Past Medical History:  Diagnosis Date   BPH (benign prostatic hyperplasia)    Cancer (HCC)    prostate   COPD (chronic obstructive pulmonary disease) (HCC)    Diabetes mellitus without complication (HCC)    Hypercholesteremia    Hypertension     Medications:  Scheduled:   [START ON 05/15/2021] aspirin EC  81 mg Oral Q breakfast   atorvastatin  40 mg Oral Daily   [START ON 05/15/2021] carvedilol  12.5 mg Oral BID WC   folic acid  1 mg Oral Daily   furosemide  40 mg Intravenous Q12H   [START ON 05/15/2021] gabapentin  300 mg Oral BID   mometasone-formoterol  2 puff Inhalation BID   multivitamin with minerals  1 tablet Oral Daily   pantoprazole  40 mg Oral Daily   sodium chloride flush  3 mL Intravenous Q12H   thiamine  100 mg Oral Daily   Or   thiamine  100 mg Intravenous Daily    Assessment: 65 yom with hx of Afib presenting in CHF exacerbation. No listed AC PTA.   Hgb 10.3, plt 191. Trop 14. No s/sx of bleeding.  Goal of Therapy:  Heparin level 0.3-0.7 units/ml Monitor platelets by anticoagulation protocol: Yes   Plan:  Give 4000 units bolus x 1 Start heparin  infusion at 1500 units/hr Check anti-Xa level in 8 hours and daily while on heparin Continue to monitor H&H and platelets  Antonietta Jewel, PharmD, [place] Clinical Pharmacist  Phone: [phone] 05/14/2021 6:13 PM  Please check AMION for all [place] phone numbers After 10:00 PM, call [place] [phone]

## 2021-05-14 NOTE — ED Triage Notes (Signed)
°  Patient comes in with SOB that started earlier this morning while patient was on the way to get steroid injection for lower back pain.  Patient states his breathing felt bad this morning and had increased WOB with SOB.  Hx of systolic/diastolic CHF.  Pain 3/10.  States it feels hard to catch a breath.

## 2021-05-14 NOTE — ED Provider Notes (Signed)
[facility] EMERGENCY DEPT Provider Note   CSN: [id number] Arrival date & time: 05/14/21  0600     History Chief Complaint  Patient presents with   Shortness of Breath    Francis Cardenas is a 70 y.o. male.  HPI  70 year old male with past medical history of HTN, HLD, DM, COPD, CHF, newly diagnosed atrial fibrillation, remote prostate cancer presents emergency department with shortness of breath.  Patient was up early this morning supposed to go to an appointment for an epidural steroid injection.  However on the drive he started feeling increasingly short of breath.  He states that he has had increased weight gain over the past month, close to 20 pounds.  He is been taking his spironolactone and torsemide.  He states he was recently diagnosed with A. fib, has followed with the [facility] clinic and outpatient cardiology.  Not put on any anticoagulation.  States that he recently had an echo but has not had the results.  States the swelling in his lower extremities are worse, he feels short of breath even with minimal physical activity.  He has an intermittently productive cough but denies any fever.  No chest or back pain.  Past Medical History:  Diagnosis Date   BPH (benign prostatic hyperplasia)    Cancer (HCC)    prostate   COPD (chronic obstructive pulmonary disease) ([place])    Diabetes mellitus without complication ([place])    Hypercholesteremia    Hypertension     Patient Active Problem List   Diagnosis Date Noted   Hypertensive urgency 04/06/2019   Elevated troponin 04/06/2019   Community acquired pneumonia    COPD with acute exacerbation ([place]) 12/12/2018   Morbid obesity ([place]) 12/12/2018   Chronic diastolic CHF (congestive heart failure) ([place]) 12/10/2018   Fall 12/10/2018   Multiple rib fractures 12/10/2018   OSA on CPAP 12/10/2018   Hypokalemia 12/10/2018   Acute on chronic systolic CHF (congestive heart failure) ([place])    Class 2 severe obesity due to excess  calories with serious comorbidity and body mass index (BMI) of 37.0 to 37.9 in adult [facility])    CHF (congestive heart failure) ([place]) 01/28/2018   Acute CHF (congestive heart failure) ([place]) 12/31/2017   Acute respiratory failure with hypoxia ([place]) 12/30/2017   Chest pain syndrome 12/30/2017   Carcinoma of prostate ([place]) 08/02/2016   COPD (chronic obstructive pulmonary disease) ([place]) 08/02/2016   GERD (gastroesophageal reflux disease) 08/02/2016   Hyperlipidemia 08/02/2016   Peripheral neuropathy 08/02/2016   Steatosis of liver 08/02/2016   Diabetes mellitus type 2 in obese ([place]) 08/02/2016   Benign essential hypertension 06/11/2013   Alcohol dependence ([place]) 03/26/2013   PTSD (post-traumatic stress disorder) 03/26/2013   Alcohol abuse 03/24/2013   Depressive disorder 03/24/2013    Past Surgical History:  Procedure Laterality Date   ACHILLES TENDON REPAIR Left    KNEE ARTHROSCOPY Left    RIGHT/LEFT HEART CATH AND CORONARY ANGIOGRAPHY N/A 04/09/2019   Procedure: RIGHT/LEFT HEART CATH AND CORONARY ANGIOGRAPHY;  Surgeon: Martinique, Peter M, MD;  Location: [facility];  Service: Cardiovascular;  Laterality: N/A;   TONSILLECTOMY     WRIST FRACTURE SURGERY Left        Family History  Problem Relation Age of Onset   CAD Mother    Leukemia Mother    Heart attack Father    Heart attack Brother     Social History   Tobacco Use   Smoking status: Former   Smokeless tobacco: Never  Vaping Use   Vaping Use: Never used  Substance Use Topics   Alcohol use: Yes    Alcohol/week: 18.0 standard drinks    Types: 18 Cans of beer per week    Comment: 18 cans beer/day   Drug use: No    Home Medications Prior to Admission medications   Medication Sig Start Date End Date Taking? Authorizing Provider  albuterol (PROVENTIL HFA;VENTOLIN HFA) 108 (90 BASE) MCG/ACT inhaler Inhale 2 puffs into the lungs every 6 (six) hours as needed for wheezing.    [provider]  ALPRAZolam  Duanne Moron) 1 MG tablet Take 1 mg by mouth at bedtime as needed for anxiety or sleep.  12/05/18   [provider]  amLODipine (NORVASC) 10 MG tablet Take 10 mg by mouth daily with breakfast.     [provider]  aspirin EC 81 MG tablet Take 81 mg by mouth daily with breakfast.     [provider]  atorvastatin (LIPITOR) 80 MG tablet Take 40 mg by mouth daily.     [provider]  budesonide-formoterol (SYMBICORT) 80-4.5 MCG/ACT inhaler Inhale 2 puffs into the lungs daily.     [provider]  Carboxymethylcellulose Sodium 0.25 % SOLN Place 1 drop into both eyes 4 (four) times daily as needed (dry eyes/ irritation).    [provider]  carvedilol (COREG) 25 MG tablet Take 1 tablet (25 mg total) by mouth 2 (two) times daily with a meal. For hypertension. 03/31/13   Ruben Im, PA-C  chlordiazePOXIDE (LIBRIUM) 25 MG capsule 50mg  PO TID x 1D, then 25-50mg  PO BID X 1D, then 25-50mg  PO QD X 1D 06/05/20   [name], MD  cholecalciferol (VITAMIN D) 1000 units tablet Take 1,000 Units by mouth daily with supper.     [provider]  diclofenac sodium (VOLTAREN) 1 % GEL Apply 1 application topically 2 (two) times daily as needed (athritis pain).  10/12/18   [provider]  Ensure Max Protein (ENSURE MAX PROTEIN) LIQD Take 330 mLs (11 oz total) by mouth 2 (two) times daily. Patient taking differently: Take 11 oz by mouth 2 (two) times daily as needed (meal supplement).  12/12/18   [name], [name], MD  gabapentin (NEURONTIN) 300 MG capsule Take 300 mg by mouth 3 (three) times daily.    [provider]  hydrALAZINE (APRESOLINE) 25 MG tablet Take 1 tablet (25 mg total) by mouth every 8 (eight) hours. Patient taking differently: Take 25 mg by mouth daily.  04/10/19 12/21/19  Donne Hazel, MD  lisinopril (PRINIVIL,ZESTRIL) 40 MG tablet Take 1 tablet (40 mg total) by mouth daily. For hypertension. Patient taking differently: Take 40 mg  by mouth daily with breakfast. For hypertension. 03/31/13   Nena Polio T, PA-C  magnesium oxide (MAG-OX) 400 MG tablet Take 1 tablet (400 mg total) by mouth daily. Patient taking differently: Take 400 mg by mouth daily with supper.  01/31/18   Barton Dubois, MD  meloxicam (MOBIC) 15 MG tablet Take 15 mg by mouth at bedtime. 10/31/18   [provider]  Multiple Vitamins-Minerals (MULTIVITAMIN WITH MINERALS) tablet Take 1 tablet by mouth daily with lunch.     [provider]  nystatin cream (MYCOSTATIN) Apply to affected area 2 times daily 02/19/21   Regan Lemming, MD  omeprazole (PRILOSEC) 40 MG capsule Take 40 mg by mouth daily with supper.     [provider]  potassium chloride SA (K-DUR,KLOR-CON) 20 MEQ tablet Take 2 tablets (40  mEq total) by mouth daily. Patient taking differently: Take 40 mEq by mouth daily with supper.  01/31/18   Barton Dubois, MD  PRESCRIPTION MEDICATION Inhale into the lungs at bedtime. CPAP    [provider]  sertraline (ZOLOFT) 100 MG tablet Take one pill by mouth each day for depression and anxiety. Patient not taking: Reported on 12/16/2019 03/31/13   Ruben Im, PA-C  spironolactone (ALDACTONE) 25 MG tablet Take 1 tablet (25 mg total) by mouth daily. 04/11/19 12/21/19  Donne Hazel, MD  thiamine 100 MG tablet Take 1 tablet (100 mg total) by mouth daily. 06/05/20   [name], MD  tiotropium (SPIRIVA HANDIHALER) 18 MCG inhalation capsule Place 1 capsule (18 mcg total) into inhaler and inhale daily. Patient not taking: Reported on 03/16/2019 12/12/18 12/12/19  [name], [name], MD  torsemide (DEMADEX) 20 MG tablet Take 1 tablet (20 mg total) by mouth 2 (two) times daily. 04/10/19 12/21/19  Donne Hazel, MD  vitamin B-12 (CYANOCOBALAMIN) 1000 MCG tablet Take 1,000 mcg by mouth daily with supper.     [provider]    Allergies    Patient has no known allergies.  Review of Systems   Review of Systems   Constitutional:  Positive for fatigue. Negative for chills and fever.  HENT:  Negative for congestion.   Eyes:  Negative for visual disturbance.  Respiratory:  Positive for cough, chest tightness and shortness of breath.   Cardiovascular:  Positive for leg swelling. Negative for chest pain and palpitations.  Gastrointestinal:  Negative for abdominal pain, diarrhea and vomiting.  Genitourinary:  Negative for dysuria.  Skin:  Negative for rash.  Neurological:  Negative for headaches.   Physical Exam Updated Vital Signs BP (!) 158/58    Pulse 67    Temp 98.4 F (36.9 C)    Resp (!) 22    Ht 5\' 10"  (1.778 m)    Wt (!) 145.6 kg    SpO2 95%    BMI 46.06 kg/m   Physical Exam Vitals and nursing note reviewed.  Constitutional:      General: He is not in acute distress.    Appearance: Normal appearance. He is obese.  HENT:     Head: Normocephalic.     Mouth/Throat:     Mouth: Mucous membranes are moist.  Cardiovascular:     Rate and Rhythm: Normal rate.  Pulmonary:     Effort: Pulmonary effort is normal. Tachypnea present. No respiratory distress.     Breath sounds: Examination of the right-lower field reveals decreased breath sounds. Examination of the left-lower field reveals decreased breath sounds. Decreased breath sounds and rales present.  Abdominal:     Palpations: Abdomen is soft.     Tenderness: There is no abdominal tenderness.  Musculoskeletal:     Right lower leg: Edema present.     Left lower leg: Edema present.  Skin:    General: Skin is warm.  Neurological:     Mental Status: He is alert and oriented to person, place, and time. Mental status is at baseline.  Psychiatric:        Mood and Affect: Mood normal.    ED Results / Procedures / Treatments   Labs (all labs ordered are listed, but only abnormal results are displayed) Labs Reviewed  BASIC METABOLIC PANEL - Abnormal; Notable for the following components:      Result Value   Sodium 129 (*)    Potassium 5.2  (*)  Glucose, Bld 119 (*)    BUN 31 (*)    Creatinine, Ser 1.57 (*)    Calcium 10.4 (*)    GFR, Estimated 47 (*)    All other components within normal limits  CBC - Abnormal; Notable for the following components:   RBC 3.37 (*)    Hemoglobin 10.3 (*)    HCT 31.6 (*)    All other components within normal limits  BRAIN NATRIURETIC PEPTIDE - Abnormal; Notable for the following components:   B Natriuretic Peptide 458.2 (*)    All other components within normal limits  MAGNESIUM    EKG EKG Interpretation  Date/Time:  Friday May 14 2021 06:10:44 EST Ventricular Rate:  58 PR Interval:    QRS Duration: 96 QT Interval:  438 QTC Calculation: 429 R Axis:   71 Text Interpretation: Atrial fibrillation with slow ventricular response with premature ventricular or aberrantly conducted complexes Low voltage QRS Cannot rule out Anterior infarct , age undetermined ST & T wave abnormality, consider inferior ischemia Abnormal ECG Confirmed by Lavenia Atlas [phone]) on 05/14/2021 7:31:14 AM  Radiology DG Chest Port 1 View  Result Date: 05/14/2021 CLINICAL DATA:  Provided history: Shortness of breath. EXAM: PORTABLE CHEST 1 VIEW COMPARISON:  Prior chest radiographs 12/16/2019 and earlier. FINDINGS: An electronic device projects over the left upper chest. Heart size within normal limits. Central pulmonary vascular congestion without overt pulmonary edema. No appreciable airspace consolidation. No evidence of pleural effusion or pneumothorax. No acute bony abnormality identified. IMPRESSION: Central pulmonary vascular congestion without overt pulmonary edema. No appreciable airspace consolidation. Electronically Signed   By: Kellie Simmering D.O.   On: 05/14/2021 08:07    Procedures Procedures   Medications Ordered in ED Medications - No data to display  ED Course  I have reviewed the triage vital signs and the nursing notes.  Pertinent labs & imaging results that were available during my care  of the patient were reviewed by me and considered in my medical decision making (see chart for details).    MDM Rules/Calculators/A&P                          70 year old male presents emergency department with shortness of breath.  He is conversationally dyspneic, tachypneic but maintaining his oxygen saturation.  Appears short of breath.  He has diffuse rales, decreased breath sounds, obese body habitus.  EKG shows atrial fibrillation which the patient states is known for him.  He states has been seen by cardiology, had a recent echo.  However this is all through the [place] and I am unable to view any of this.  I also find it surprising that he is not on anticoagulation given his high CHA2DS2-VASc score.  BNP is elevated, chest x-ray looks fluid overloaded.  He also has an AKI with mild electrolyte abnormalities.  Patient was diuresed with good fluid output.  His breathing improved however he is now diffuse wheezing.  We will treated with a breathing treatment, history of COPD.  Plan for admission for CHF exacerbation in the setting of atrial fibrillation.  Patients evaluation and results requires admission for further treatment and care. Patient agrees with admission plan, offers no new complaints and is stable/unchanged at time of admit.  Flu and COVID is pending.     Final Clinical Impression(s) / ED Diagnoses Final diagnoses:  SOB (shortness of breath)    Rx / DC Orders ED Discharge Orders     None  Lorelle Gibbs, DO 05/14/21 1351

## 2021-05-14 NOTE — ED Notes (Signed)
[facility] called to place consult with hospitalist.

## 2021-05-14 NOTE — Plan of Care (Signed)
°  Problem: Education: Goal: Knowledge of General Education information will improve Description: Including pain rating scale, medication(s)/side effects and non-pharmacologic comfort measures Outcome: Progressing   Problem: Health Behavior/Discharge Planning: Goal: Ability to manage health-related needs will improve Outcome: Progressing   Problem: Health Behavior/Discharge Planning: Goal: Ability to manage health-related needs will improve Outcome: Progressing   Problem: Clinical Measurements: Goal: Ability to maintain clinical measurements within normal limits will improve Outcome: Progressing Goal: Will remain free from infection Outcome: Progressing Goal: Diagnostic test results will improve Outcome: Progressing Goal: Respiratory complications will improve Outcome: Progressing Goal: Cardiovascular complication will be avoided Outcome: Progressing   Problem: Activity: Goal: Risk for activity intolerance will decrease Outcome: Progressing   Problem: Nutrition: Goal: Adequate nutrition will be maintained Outcome: Progressing   Problem: Coping: Goal: Level of anxiety will decrease Outcome: Progressing   Problem: Elimination: Goal: Will not experience complications related to bowel motility Outcome: Progressing Goal: Will not experience complications related to urinary retention Outcome: Progressing   Problem: Pain Managment: Goal: General experience of comfort will improve Outcome: Progressing   Problem: Safety: Goal: Ability to remain free from injury will improve Outcome: Progressing   Problem: Education: Goal: Ability to demonstrate management of disease process will improve Outcome: Progressing Goal: Ability to verbalize understanding of medication therapies will improve Outcome: Progressing Goal: Individualized Educational Video(s) Outcome: Progressing   Problem: Skin Integrity: Goal: Risk for impaired skin integrity will decrease Outcome:  Progressing

## 2021-05-14 NOTE — ED Notes (Signed)
Report given to [place], Alyse Low

## 2021-05-14 NOTE — Progress Notes (Signed)
RT placed patient on CPAP HS. 2L Bled in needed. Patient tolerating well at this time.

## 2021-05-15 ENCOUNTER — Inpatient Hospital Stay (HOSPITAL_COMMUNITY): Payer: No Typology Code available for payment source

## 2021-05-15 DIAGNOSIS — J439 Emphysema, unspecified: Secondary | ICD-10-CM

## 2021-05-15 DIAGNOSIS — L8990 Pressure ulcer of unspecified site, unspecified stage: Secondary | ICD-10-CM | POA: Insufficient documentation

## 2021-05-15 LAB — BASIC METABOLIC PANEL
Anion gap: 8 (ref 5–15)
BUN: 18 mg/dL (ref 8–23)
CO2: 26 mmol/L (ref 22–32)
Calcium: 10.4 mg/dL — ABNORMAL HIGH (ref 8.9–10.3)
Chloride: 100 mmol/L (ref 98–111)
Creatinine, Ser: 1.2 mg/dL (ref 0.61–1.24)
GFR, Estimated: >60 mL/min (ref >60)
Glucose, Bld: 125 mg/dL — ABNORMAL HIGH (ref 70–99)
Potassium: 4.1 mmol/L (ref 3.5–5.1)
Sodium: 134 mmol/L — ABNORMAL LOW (ref 135–145)

## 2021-05-15 LAB — CBC
HCT: 31.4 % — ABNORMAL LOW (ref 39.0–52.0)
Hemoglobin: 10.2 g/dL — ABNORMAL LOW (ref 13.0–17.0)
MCH: 30.7 pg (ref 26.0–34.0)
MCHC: 32.5 g/dL (ref 30.0–36.0)
MCV: 94.6 fL (ref 80.0–100.0)
Platelets: 202 10*3/uL (ref 150–400)
RBC: 3.32 MIL/uL — ABNORMAL LOW (ref 4.22–5.81)
RDW: 14.3 % (ref 11.5–15.5)
WBC: 7.9 10*3/uL (ref 4.0–10.5)
nRBC: 0 % (ref 0.0–0.2)

## 2021-05-15 LAB — URINALYSIS, COMPLETE (UACMP) WITH MICROSCOPIC
Bacteria, UA: NONE SEEN
Bilirubin Urine: NEGATIVE
Glucose, UA: NEGATIVE mg/dL
Hgb urine dipstick: NEGATIVE
Ketones, ur: NEGATIVE mg/dL
Leukocytes,Ua: NEGATIVE
Nitrite: NEGATIVE
Protein, ur: NEGATIVE mg/dL
Specific Gravity, Urine: <1.005 — ABNORMAL LOW (ref 1.005–1.030)
Squamous Epithelial / HPF: NONE SEEN (ref 0–5)
pH: 6 (ref 5.0–8.0)

## 2021-05-15 LAB — IRON AND TIBC
Iron: 26 ug/dL — ABNORMAL LOW (ref 45–182)
Saturation Ratios: 6 % — ABNORMAL LOW (ref 17.9–39.5)
TIBC: 423 ug/dL (ref 250–450)
UIBC: 397 ug/dL

## 2021-05-15 LAB — HEPATIC FUNCTION PANEL
ALT: 15 U/L (ref 0–44)
AST: 14 U/L — ABNORMAL LOW (ref 15–41)
Albumin: 3.6 g/dL (ref 3.5–5.0)
Alkaline Phosphatase: 58 U/L (ref 38–126)
Bilirubin, Direct: 0.3 mg/dL — ABNORMAL HIGH (ref 0.0–0.2)
Indirect Bilirubin: 0.6 mg/dL (ref 0.3–0.9)
Total Bilirubin: 0.9 mg/dL (ref 0.3–1.2)
Total Protein: 6.5 g/dL (ref 6.5–8.1)

## 2021-05-15 LAB — RETICULOCYTES
Immature Retic Fract: 25.2 % — ABNORMAL HIGH (ref 2.3–15.9)
RBC.: 3.3 MIL/uL — ABNORMAL LOW (ref 4.22–5.81)
Retic Count, Absolute: 95 10*3/uL (ref 19.0–186.0)
Retic Ct Pct: 2.9 % (ref 0.4–3.1)

## 2021-05-15 LAB — HEMOGLOBIN A1C
Hgb A1c MFr Bld: 5.5 % (ref 4.8–5.6)
Mean Plasma Glucose: 111.15 mg/dL

## 2021-05-15 LAB — HEPARIN LEVEL (UNFRACTIONATED)
Heparin Unfractionated: 0.21 IU/mL — ABNORMAL LOW (ref 0.30–0.70)
Heparin Unfractionated: 0.35 IU/mL (ref 0.30–0.70)

## 2021-05-15 LAB — FOLATE: Folate: 26.5 ng/mL (ref >5.9)

## 2021-05-15 LAB — VITAMIN B12: Vitamin B-12: 470 pg/mL (ref 180–914)

## 2021-05-15 LAB — FERRITIN: Ferritin: 79 ng/mL (ref 24–336)

## 2021-05-15 LAB — TSH: TSH: 2.353 u[IU]/mL (ref 0.350–4.500)

## 2021-05-15 MED ORDER — MELATONIN 3 MG PO TABS
3.0000 mg | ORAL_TABLET | Freq: Every evening | ORAL | Status: DC | PRN
  Administered 2021-05-15 – 2021-05-21 (×7): 3 mg via ORAL
  Filled 2021-05-15 (×7): qty 1

## 2021-05-15 MED ORDER — ACETAMINOPHEN 325 MG PO TABS
650.0000 mg | ORAL_TABLET | ORAL | Status: DC | PRN
  Administered 2021-05-15 – 2021-05-20 (×5): 650 mg via ORAL
  Filled 2021-05-15 (×5): qty 2

## 2021-05-15 MED ORDER — HEPARIN BOLUS VIA INFUSION
1750.0000 [IU] | Freq: Once | INTRAVENOUS | Status: AC
  Administered 2021-05-15: 04:00:00 1750 [IU] via INTRAVENOUS
  Filled 2021-05-15: qty 1750

## 2021-05-15 NOTE — Progress Notes (Signed)
Pt with new onset fever.  1) BCx 2) UA and UCx 3) CXR 4) check LFTs 5) has CBC and BMP ordered for AM 6) negative for COVID and flu just yesterday

## 2021-05-15 NOTE — Evaluation (Signed)
Occupational Therapy Evaluation Patient Details Name: Francis Cardenas MRN: [id number] DOB: 21-Jan-1951 Today's Date: 05/15/2021   History of Present Illness 70 y.o. male with a past medical history of chronic diastolic CHF, essential hypertension, morbid obesity, obstructive sleep apnea on CPAP who was in his usual state of health till about 2 months ago when he started noticing that he was gaining weight, and started developing shortness of breath.   Clinical Impression   Patient admitted for the diagnosis above.  PTA he lives at home with his spouse.  He has a ramped entrance, typically needs assist with lower body ADL, meals and home management, and walks with a 4WRW.  Currently decreased activity tolerance is his primary barrier.  OT can follow in the acute setting, but no HH OT is anticipated.  Recommend home with prior level of supports.        Recommendations for follow up therapy are one component of a multi-disciplinary discharge planning process, led by the attending physician.  Recommendations may be updated based on patient status, additional functional criteria and insurance authorization.   Follow Up Recommendations  No OT follow up    Assistance Recommended at Discharge Set up Supervision/Assistance  Functional Status Assessment  Patient has had a recent decline in their functional status and demonstrates the ability to make significant improvements in function in a reasonable and predictable amount of time.  Equipment Recommendations  None recommended by OT    Recommendations for Other Services       Precautions / Restrictions Precautions Precautions: Fall Restrictions Weight Bearing Restrictions: No      Mobility Bed Mobility Overal bed mobility: Needs Assistance Bed Mobility: Supine to Sit     Supine to sit: Supervision;HOB elevated     General bed mobility comments: sleeps in a recliner at baseline    Transfers Overall transfer level: Needs  assistance   Transfers: Sit to/from Stand;Bed to chair/wheelchair/BSC Sit to Stand: Min assist     Step pivot transfers: Min assist            Balance Overall balance assessment: Needs assistance Sitting-balance support: Feet supported Sitting balance-Leahy Scale: Good     Standing balance support: Reliant on assistive device for balance Standing balance-Leahy Scale: Poor                             ADL either performed or assessed with clinical judgement   ADL       Grooming: Sitting;Set up   Upper Body Bathing: Minimal assistance;Sitting       Upper Body Dressing : Minimal assistance;Sitting   Lower Body Dressing: Maximal assistance;Sit to/from stand   Toilet Transfer: Minimal assistance;Stand-pivot;BSC/3in1                   Vision Patient Visual Report: No change from baseline       Perception Perception Perception: Not tested   Praxis Praxis Praxis: Not tested    Pertinent Vitals/Pain Pain Assessment: No/denies pain     Hand Dominance Right   Extremity/Trunk Assessment Upper Extremity Assessment Upper Extremity Assessment: Generalized weakness   Lower Extremity Assessment Lower Extremity Assessment: Defer to PT evaluation   Cervical / Trunk Assessment Cervical / Trunk Assessment: Other exceptions Cervical / Trunk Exceptions: body habitus   Communication Communication Communication: No difficulties   Cognition Arousal/Alertness: Awake/alert Behavior During Therapy: WFL for tasks assessed/performed Overall Cognitive Status: Within Functional Limits for tasks assessed  General Comments: hyper verbal.  Baseline forgetfulness.     General Comments   VVS on RA    Exercises     Shoulder Instructions      Home Living Family/patient expects to be discharged to:: Private residence Living Arrangements: Spouse/significant other Available Help at Discharge: Family;Available  24 hours/day Type of Home: House Home Access: Ramped entrance     Home Layout: One level     Bathroom Shower/Tub: Walk-in shower;Sponge bathes at baseline   Bathroom Toilet: Handicapped height Bathroom Accessibility: No   Home Equipment: Rollator (4 wheels);Shower seat;Grab bars - tub/shower;Adaptive equipment Adaptive Equipment: Reacher        Prior Functioning/Environment Prior Level of Function : Independent/Modified Independent             Mobility Comments: 4WRW at baseline ADLs Comments: Difficulty reaching his feet.  Assist with meals and home management.  continues to drive locally.        OT Problem List: Decreased strength;Decreased activity tolerance;Impaired balance (sitting and/or standing)      OT Treatment/Interventions: Self-care/ADL training;Therapeutic exercise;Therapeutic activities;DME and/or AE instruction;Patient/family education;Balance training    OT Goals(Current goals can be found in the care plan section) Acute Rehab OT Goals Patient Stated Goal: Return home Monday or Tuesday OT Goal Formulation: With patient Time For Goal Achievement: 05/29/21 Potential to Achieve Goals: Good ADL Goals Pt Will Perform Grooming: with set-up;standing Pt Will Perform Upper Body Bathing: with set-up;sitting Pt Will Perform Upper Body Dressing: with set-up;sitting Pt Will Perform Lower Body Dressing: with set-up;sit to/from stand;with adaptive equipment Pt Will Transfer to Toilet: with modified independence;regular height toilet;ambulating Pt/caregiver will Perform Home Exercise Program: Increased strength;Both right and left upper extremity;With theraband;With written HEP provided  OT Frequency: Min 2X/week   Barriers to D/C:    none noted       Co-evaluation              AM-PAC OT "6 Clicks" Daily Activity     Outcome Measure Help from another person eating meals?: None Help from another person taking care of personal grooming?: None Help  from another person toileting, which includes using toliet, bedpan, or urinal?: A Little Help from another person bathing (including washing, rinsing, drying)?: A Lot Help from another person to put on and taking off regular upper body clothing?: A Little Help from another person to put on and taking off regular lower body clothing?: A Lot 6 Click Score: 18   End of Session    Activity Tolerance: Patient tolerated treatment well Patient left: in chair;with call bell/phone within reach  OT Visit Diagnosis: Unsteadiness on feet (R26.81);Muscle weakness (generalized) (M62.81)                Time: 6314-9702 OT Time Calculation (min): 26 min Charges:  OT General Charges $OT Visit: 1 Visit OT Evaluation $OT Eval Moderate Complexity: 1 Mod OT Treatments $Self Care/Home Management : 8-22 mins  05/15/2021  RP, OTR/L  Acute Rehabilitation Services  Office:  [phone]   Metta Clines 05/15/2021, 10:28 AM

## 2021-05-15 NOTE — Progress Notes (Signed)
ANTICOAGULATION CONSULT NOTE   Pharmacy Consult for heparin Indication: atrial fibrillation  Allergies  Allergen Reactions   Oxcarbazepine Other (See Comments)    Dizziness/ lightheaded   Zolpidem Nausea Only    Patient Measurements: Height: 5\' 10"  (177.8 cm) Weight: 133.4 kg (294 lb 1.5 oz) IBW/kg (Calculated) : 73 Heparin Dosing Weight: 107.6 kg  Vital Signs: Temp: 99.1 F (37.3 C) (12/24 0433) Temp Source: Oral (12/24 0433) BP: 157/60 (12/24 0807) Pulse Rate: 69 (12/24 0745)  Labs: Recent Labs    05/14/21 0619 05/14/21 1010 05/14/21 1132 05/15/21 0329 05/15/21 1301  HGB 10.3*  --   --  10.2*  --   HCT 31.6*  --   --  31.4*  --   PLT 191  --   --  202  --   HEPARINUNFRC  --   --   --  0.21* 0.35  CREATININE 1.57*  --   --  1.20  --   TROPONINIHS  --  13 14  --   --      Estimated Creatinine Clearance: 76.5 mL/min (by C-G formula based on SCr of 1.2 mg/dL).   Assessment: 71 yom with new Afib presenting in CHF exacerbation. No listed AC PTA. Pharmacy dosing heparin.  Heparin level at goal (0.35) on heparin gtt at 1750 units/hr. No issues with line or bleeding reported per RN. CBC stable  Discuss with MD change to oral agent tomorrow   Goal of Therapy:  Heparin level 0.3-0.7 units/ml Monitor platelets by anticoagulation protocol: Yes   Plan:  Continue heparin infusion 1750 units/hr Daily heparin level and CBC    Bonnita Nasuti Pharm.D. CPP, BCPS Clinical Pharmacist [phone] 05/15/2021 4:16 PM   Please see amion for complete clinical pharmacist phone list 05/15/2021 4:13 PM

## 2021-05-15 NOTE — Progress Notes (Signed)
ANTICOAGULATION CONSULT NOTE   Pharmacy Consult for heparin Indication: atrial fibrillation  Allergies  Allergen Reactions   Oxcarbazepine Other (See Comments)    Dizziness/ lightheaded   Zolpidem Nausea Only    Patient Measurements: Height: 5\' 10"  (177.8 cm) Weight: (!) 145.6 kg (321 lb) IBW/kg (Calculated) : 73 Heparin Dosing Weight: 107.6 kg  Vital Signs: Temp: 98.2 F (36.8 C) (12/23 2316) Temp Source: Axillary (12/23 2316) BP: 149/46 (12/23 2316) Pulse Rate: 62 (12/23 2316)  Labs: Recent Labs    05/14/21 0619 05/14/21 1010 05/14/21 1132 05/15/21 0329  HGB 10.3*  --   --  10.2*  HCT 31.6*  --   --  31.4*  PLT 191  --   --  202  HEPARINUNFRC  --   --   --  0.21*  CREATININE 1.57*  --   --   --   TROPONINIHS  --  13 14  --      Estimated Creatinine Clearance: 61.4 mL/min (A) (by C-G formula based on SCr of 1.57 mg/dL (H)).   Assessment: 19 yom with hx of Afib presenting in CHF exacerbation. No listed AC PTA. Pharmacy dosing heparin.  Heparin level subtherapeutic (0.21) on gtt at 1500 units/hr. No issues with line or bleeding reported per RN.  Goal of Therapy:  Heparin level 0.3-0.7 units/ml Monitor platelets by anticoagulation protocol: Yes   Plan:  Rebolus heparin 1750 units Increase heparin infusion to 1750 units/hr Will f/u 8hr heparin level  Sherlon Handing, PharmD, BCPS Please see amion for complete clinical pharmacist phone list 05/15/2021 4:08 AM

## 2021-05-15 NOTE — Evaluation (Signed)
Physical Therapy Evaluation Patient Details Name: Francis Cardenas MRN: [id number] DOB: [birth year] Today's Date: 05/15/2021  History of Present Illness  70 y.o. male with a past medical history of chronic diastolic CHF, essential hypertension, morbid obesity, obstructive sleep apnea on CPAP who was in his usual state of health till about 2 months ago when he started noticing that he was gaining weight, and started developing shortness of breath.   Clinical Impression  Pt in bed upon arrival of PT, agreeable to evaluation at this time. Prior to admission the pt was mobilizing within-home distances with use of 4-wheel walker, but was able to complete all transfers and mobility without physical assist. He does report significantly increased falls recently. The pt now presents with limitations in functional mobility, strength, power, dynamic stability, and activity tolerance due to above dx, and will continue to benefit from skilled PT to address these deficits. He required minA to minG to complete sit-stand transfers, and was able to complete ~45 ft hallway ambulation with use of walker and frequent standing rest breaks. The pt had no overt LOB but was limited by pain (generalized and chronic). The pt will continue to benefit from skilled PT to progress OOB mobility, strength, and activity tolerance to allow for safe d/c home.         Recommendations for follow up therapy are one component of a multi-disciplinary discharge planning process, led by the attending physician.  Recommendations may be updated based on patient status, additional functional criteria and insurance authorization.  Follow Up Recommendations Home health PT    Assistance Recommended at Discharge Intermittent Supervision/Assistance  Functional Status Assessment Patient has had a recent decline in their functional status and demonstrates the ability to make significant improvements in function in a reasonable and predictable amount  of time.  Equipment Recommendations  None recommended by PT    Recommendations for Other Services       Precautions / Restrictions Precautions Precautions: Fall Restrictions Weight Bearing Restrictions: No      Mobility  Bed Mobility Overal bed mobility: Needs Assistance Bed Mobility: Sit to Supine     Supine to sit: Supervision;HOB elevated Sit to supine: Min guard   General bed mobility comments: increased time and effort to complete. pt sleeps in recliner for last 7 years    Transfers Overall transfer level: Needs assistance Equipment used: Rollator (4 wheels) Transfers: Sit to/from Stand Sit to Stand: Min assist;Min guard   Step pivot transfers: Min assist       General transfer comment: minG with VC for hand placement to stand, minA for lower as pt with tendency to plop    Ambulation/Gait Ambulation/Gait assistance: Min guard Gait Distance (Feet): 45 Feet Assistive device: Rollator (4 wheels) Gait Pattern/deviations: Step-through pattern;Decreased stride length;Trunk flexed Gait velocity: decreased Gait velocity interpretation: <1.31 ft/sec, indicative of household ambulator   General Gait Details: pt with trunk flexed, inconsistent step length and cadence. reporting pain in feet with steps and chronic back pain     Balance Overall balance assessment: Needs assistance Sitting-balance support: Feet supported Sitting balance-Leahy Scale: Good     Standing balance support: Reliant on assistive device for balance Standing balance-Leahy Scale: Poor Standing balance comment: dependent on BUE support                             Pertinent Vitals/Pain Pain Assessment: Faces Faces Pain Scale: Hurts even more Pain Location: generalized chronic pain in back,  feet, hips, and knees. Pain Descriptors / Indicators: Discomfort Pain Intervention(s): Limited activity within patient's tolerance;Monitored during session;Repositioned    Home Living  Family/patient expects to be discharged to:: Private residence Living Arrangements: Spouse/significant other Available Help at Discharge: Family;Available 24 hours/day Type of Home: House Home Access: Ramped entrance       Home Layout: One level Home Equipment: Rollator (4 wheels);Shower seat;Grab bars - tub/shower;Adaptive equipment      Prior Function Prior Level of Function : Independent/Modified Independent             Mobility Comments: 4WRW at baseline ADLs Comments: Difficulty reaching his feet.  Assist with meals and home management.  continues to drive locally.     Hand Dominance   Dominant Hand: Right    Extremity/Trunk Assessment   Upper Extremity Assessment Upper Extremity Assessment: Defer to OT evaluation    Lower Extremity Assessment Lower Extremity Assessment: Generalized weakness (pt able to use functionally to perform sit-stand. reports mild bilateral tingling on feet.)    Cervical / Trunk Assessment Cervical / Trunk Assessment: Other exceptions Cervical / Trunk Exceptions: body habitus  Communication   Communication: No difficulties  Cognition Arousal/Alertness: Awake/alert Behavior During Therapy: WFL for tasks assessed/performed Overall Cognitive Status: Within Functional Limits for tasks assessed                                 General Comments: hyperverbal, mildly impulsive. perseverating on diffuse chronic pain, pt states no pain management strategies and has never brought up his pain to any MD        General Comments General comments (skin integrity, edema, etc.): VSS on RA    Exercises     Assessment/Plan    PT Assessment Patient needs continued PT services  PT Problem List Decreased strength;Decreased range of motion;Decreased activity tolerance;Decreased balance;Decreased safety awareness;Decreased mobility       PT Treatment Interventions DME instruction;Gait training;Stair training;Functional mobility  training;Therapeutic activities;Therapeutic exercise;Balance training;Patient/family education    PT Goals (Current goals can be found in the Care Plan section)  Acute Rehab PT Goals Patient Stated Goal: return home PT Goal Formulation: With patient Time For Goal Achievement: 05/29/21 Potential to Achieve Goals: Good    Frequency Min 3X/week    AM-PAC PT "6 Clicks" Mobility  Outcome Measure Help needed turning from your back to your side while in a flat bed without using bedrails?: A Little Help needed moving from lying on your back to sitting on the side of a flat bed without using bedrails?: A Little Help needed moving to and from a bed to a chair (including a wheelchair)?: A Little Help needed standing up from a chair using your arms (e.g., wheelchair or bedside chair)?: A Little Help needed to walk in hospital room?: A Little Help needed climbing 3-5 steps with a railing? : A Lot 6 Click Score: 17    End of Session Equipment Utilized During Treatment: Gait belt Activity Tolerance: Patient tolerated treatment well Patient left: with call bell/phone within reach;in bed;with bed alarm set Nurse Communication: Mobility status PT Visit Diagnosis: Unsteadiness on feet (R26.81);Other abnormalities of gait and mobility (R26.89);Muscle weakness (generalized) (M62.81)    Time: 1696-7893 PT Time Calculation (min) (ACUTE ONLY): 25 min   Charges:   PT Evaluation $PT Eval Low Complexity: 1 Low PT Treatments $Therapeutic Exercise: 8-22 mins        West Carbo, PT, DPT   Acute Rehabilitation  Department Pager #: [phone]  Sandra Cockayne 05/15/2021, 1:41 PM

## 2021-05-15 NOTE — Progress Notes (Signed)
[facility] PROGRESS NOTE   Francis Cardenas OMV:[id number] DOB: Jan 23, 1951 DOA: 05/14/2021  PCP: Gerome Sam, MD  Brief History/Interval Summary: 70 y.o. male with a past medical history of chronic diastolic CHF, essential hypertension, morbid obesity, obstructive sleep apnea on CPAP who was in his usual state of health till about 2 months ago when he started noticing that he was gaining weight.  He gained about 30 pounds so.  Started developing shortness of breath and decided to come to the hospital.  He was found to be fluid overloaded.  He was also recently diagnosed with atrial fibrillation by his primary care provider but has not been placed on anticoagulation yet.  Hospitalize for further management.   Reason for Visit: Acute diastolic CHF  Consultants: None  Procedures: None    Subjective/Interval History: Patient denies any improvement compared to yesterday.  Had some wheezing this morning which resolved after he was given inhaler treatment.  Admits to some chest pressure.  No significant cough.  Swelling in the leg still persists.  No nausea or vomiting.     Assessment/Plan:  Acute on chronic diastolic CHF Presented with a history of weight gain of 25 to 30 pounds in the last 2 months.  This is despite taking torsemide on a daily basis. He had an echocardiogram on December 16 at the [place] [facility]. This revealed ejection fraction of greater than 55%.  Right ventricle was normal in size and function.  RV systolic pressure was 62-83 suggesting mild to moderate pulmonary hypertension.  Diastolic function was not well evaluated due to arrhythmia.  Left atrium was noted to be mildly dilated.  No pericardial effusion was noted. Continue with IV diuretics.  Strict ins and outs.  Daily weights.  Continue carvedilol.  ACE inhibitor is on hold for now as his creatinine was initially elevated though creatinine is better today.  We will readdress ACE inhibitor in the next few  days. Chest pressure is most likely due to fluid overload and perhaps an element of COPD.  EKG did not show any ischemic changes.  Troponins were normal.  Atrial fibrillation, unspecified This was diagnosed about 2 weeks ago by his primary care provider.  He has a Pharmacist, community.  TSH normal at 2.3.  CHADS2 vascular score is 2.  Currently on IV heparin infusion.  Hopefully transition to apixaban at the time of discharge.  He will then need to follow-up with his cardiologist at the [facility].  Chronic kidney disease stage IIIa Creatinine was 1.57 yesterday.  Noted to be better today.  Monitor urine output.  Potassium is better.  Holding his ACE inhibitor and spironolactone.  Hyponatremia secondary to hypovolemia Improved this morning.  Continue to monitor.  Essential hypertension Blood pressure noted to be stable.  Occasional high readings are present.  Continue to monitor for now.  History of COPD Stable.  Continue inhaler treatment.  We will add nebulizer treatment as needed.  No wheezing appreciated currently.  Diabetes mellitus type 2 in obese Apparently this is previous history.  He has not taken metformin in at least a year or so.  HbA1c 5.5 which is consistent with his history.  No need to check CBGs frequently.  Alcohol abuse Drinks 8 cans of beers on a daily basis.  Continue CIWA protocol.  Continue thiamine multivitamins.  No evidence for withdrawal symptoms at this time.  Normocytic anemia No evidence of overt blood loss.  Folic acid 66.2, vitamin B12 470.  Ferritin 79, iron 26, TIBC  423% saturation 6.  Could be suggestive of iron deficiency.  Benefit from iron supplementation at discharge.  Further work-up can be pursued in the outpatient setting.  Obstructive sleep apnea Continue CPAP  Morbid obesity Estimated body mass index is 42.2 kg/m as calculated from the following:   Height as of this encounter: 5\' 10"  (1.778 m).   Weight as of this encounter: 133.4  kg.  Decubitus buttocks stage II Pressure Injury 05/15/21 Buttocks Left Stage 2 -  Partial thickness loss of dermis presenting as a shallow open injury with a red, pink wound bed without slough. bruising around the site vs. deep tissue injury (Active)  05/15/21 0300  Location: Buttocks  Location Orientation: Left  Staging: Stage 2 -  Partial thickness loss of dermis presenting as a shallow open injury with a red, pink wound bed without slough.  Wound Description (Comments): bruising around the site vs. deep tissue injury  Present on Admission: Yes (Per the Pt, he has had the sore for a while)    DVT Prophylaxis: On IV heparin Code Status: Full code Family Communication: Discussed with the patient Disposition Plan: Hopefully return home when improved.  PT and OT evaluation.  Limited mobility at baseline.  Uses a walker when at home.  Has to be on a wheelchair when outside home.  Status is: Inpatient  Remains inpatient appropriate because: Acute CHF requiring IV diuretics      Medications: Scheduled:  aspirin EC  81 mg Oral Q breakfast   atorvastatin  40 mg Oral Daily   carvedilol  12.5 mg Oral BID WC   folic acid  1 mg Oral Daily   furosemide  40 mg Intravenous Q12H   gabapentin  300 mg Oral BID   mometasone-formoterol  2 puff Inhalation BID   multivitamin with minerals  1 tablet Oral Daily   pantoprazole  40 mg Oral Daily   sodium chloride flush  3 mL Intravenous Q12H   thiamine  100 mg Oral Daily   Or   thiamine  100 mg Intravenous Daily   Continuous:  sodium chloride     heparin 1,750 Units/hr (05/15/21 0854)   XAJ:OINOMV chloride, acetaminophen, ALPRAZolam, LORazepam **OR** LORazepam, ondansetron (ZOFRAN) IV, sodium chloride flush  Antibiotics: Anti-infectives (From admission, onward)    None       Objective:  Vital Signs  Vitals:   05/15/21 0433 05/15/21 0500 05/15/21 0745 05/15/21 0807  BP: (!) 151/49   (!) 157/60  Pulse: 66  69   Resp: 20  16    Temp: 99.1 F (37.3 C)     TempSrc: Oral     SpO2: 96%  95%   Weight:  133.4 kg    Height:        Intake/Output Summary (Last 24 hours) at 05/15/2021 0906 Last data filed at 05/14/2021 2109 Gross per 24 hour  Intake 503.3 ml  Output 2600 ml  Net -2096.7 ml   Filed Weights   05/14/21 0617 05/15/21 0500  Weight: (!) 145.6 kg 133.4 kg    General appearance: Awake alert.  In no distress Resp: Normal effort at rest.  Diminished air entry at the bases.  No definite crackles or wheezing at this time. Cardio: S1-S2 is irregularly irregular.  No murmurs appreciated. GI: Abdomen is soft.  Nontender nondistended.  Bowel sounds are present normal.  No masses organomegaly Extremities: Continues to have 2+ pitting edema bilateral lower extremities Neurologic: Alert and oriented x3.  No focal neurological deficits.  Lab Results:  Data Reviewed: I have personally reviewed following labs and imaging studies  CBC: Recent Labs  Lab 05/14/21 0619 05/15/21 0329  WBC 8.3 7.9  HGB 10.3* 10.2*  HCT 31.6* 31.4*  MCV 93.8 94.6  PLT 191 976    Basic Metabolic Panel: Recent Labs  Lab 05/14/21 0619 05/15/21 0329  NA 129* 134*  K 5.2* 4.1  CL 98 100  CO2 24 26  GLUCOSE 119* 125*  BUN 31* 18  CREATININE 1.57* 1.20  CALCIUM 10.4* 10.4*  MG 2.0  --     GFR: Estimated Creatinine Clearance: 76.5 mL/min (by C-G formula based on SCr of 1.2 mg/dL).   HbA1C: Recent Labs    05/15/21 0329  HGBA1C 5.5     Thyroid Function Tests: Recent Labs    05/15/21 0329  TSH 2.353    Anemia Panel: Recent Labs    05/15/21 0329  VITAMINB12 470  FOLATE 26.5  FERRITIN 79  TIBC 423  IRON 26*  RETICCTPCT 2.9    Recent Results (from the past 240 hour(s))  Resp Panel by RT-PCR (Flu A&B, Covid) Nasopharyngeal Swab     Status: None   Collection Time: 05/14/21  1:46 PM   Specimen: Nasopharyngeal Swab; Nasopharyngeal(NP) swabs in vial transport medium  Result Value Ref Range Status    SARS Coronavirus 2 by RT PCR NEGATIVE NEGATIVE Final    Comment: (NOTE) SARS-CoV-2 target nucleic acids are NOT DETECTED.  The SARS-CoV-2 RNA is generally detectable in upper respiratory specimens during the acute phase of infection. The lowest concentration of SARS-CoV-2 viral copies this assay can detect is 138 copies/mL. A negative result does not preclude SARS-Cov-2 infection and should not be used as the sole basis for treatment or other patient management decisions. A negative result may occur with  improper specimen collection/handling, submission of specimen other than nasopharyngeal swab, presence of viral mutation(s) within the areas targeted by this assay, and inadequate number of viral copies(<138 copies/mL). A negative result must be combined with clinical observations, patient history, and epidemiological information. The expected result is Negative.  Fact Sheet for Patients:  EntrepreneurPulse.com.au  Fact Sheet for Healthcare Providers:  IncredibleEmployment.be  This test is no t yet approved or cleared by the [place] FDA and  has been authorized for detection and/or diagnosis of SARS-CoV-2 by FDA under an Emergency Use Authorization (EUA). This EUA will remain  in effect (meaning this test can be used) for the duration of the COVID-19 declaration under Section 564(b)(1) of the Act, 21 U.S.C.section 360bbb-3(b)(1), unless the authorization is terminated  or revoked sooner.       Influenza A by PCR NEGATIVE NEGATIVE Final   Influenza B by PCR NEGATIVE NEGATIVE Final    Comment: (NOTE) The Xpert Xpress SARS-CoV-2/FLU/RSV plus assay is intended as an aid in the diagnosis of influenza from Nasopharyngeal swab specimens and should not be used as a sole basis for treatment. Nasal washings and aspirates are unacceptable for Xpert Xpress SARS-CoV-2/FLU/RSV testing.  Fact Sheet for  Patients: EntrepreneurPulse.com.au  Fact Sheet for Healthcare Providers: IncredibleEmployment.be  This test is not yet approved or cleared by the [place] FDA and has been authorized for detection and/or diagnosis of SARS-CoV-2 by FDA under an Emergency Use Authorization (EUA). This EUA will remain in effect (meaning this test can be used) for the duration of the COVID-19 declaration under Section 564(b)(1) of the Act, 21 U.S.C. section 360bbb-3(b)(1), unless the authorization is terminated or revoked.  Performed at [facility], [address] [place], [place] [address]   MRSA Next Gen by PCR, Nasal     Status: None   Collection Time: 05/14/21  4:50 PM   Specimen: Nasal Mucosa; Nasal Swab  Result Value Ref Range Status   MRSA by PCR Next Gen NOT DETECTED NOT DETECTED Final    Comment: (NOTE) The GeneXpert MRSA Assay (FDA approved for NASAL specimens only), is one component of a comprehensive MRSA colonization surveillance program. It is not intended to diagnose MRSA infection nor to guide or monitor treatment for MRSA infections. Test performance is not FDA approved in patients less than 82 years old. Performed at [facility], [place] [address] [place], [place] [address]       Radiology Studies: DG Chest Port 1 View  Result Date: 05/14/2021 CLINICAL DATA:  Provided history: Shortness of breath. EXAM: PORTABLE CHEST 1 VIEW COMPARISON:  Prior chest radiographs 12/16/2019 and earlier. FINDINGS: An electronic device projects over the left upper chest. Heart size within normal limits. Central pulmonary vascular congestion without overt pulmonary edema. No appreciable airspace consolidation. No evidence of pleural effusion or pneumothorax. No acute bony abnormality identified. IMPRESSION: Central pulmonary vascular congestion without overt pulmonary edema. No appreciable airspace consolidation. Electronically Signed    By: Kellie Simmering D.O.   On: 05/14/2021 08:07       LOS: 1 day   [facility] Pager on [email or web address]  05/15/2021, 9:06 AM

## 2021-05-16 DIAGNOSIS — R509 Fever, unspecified: Secondary | ICD-10-CM

## 2021-05-16 LAB — HEPARIN LEVEL (UNFRACTIONATED): Heparin Unfractionated: 0.4 IU/mL (ref 0.30–0.70)

## 2021-05-16 LAB — BASIC METABOLIC PANEL
Anion gap: 9 (ref 5–15)
BUN: 20 mg/dL (ref 8–23)
CO2: 25 mmol/L (ref 22–32)
Calcium: 9.9 mg/dL (ref 8.9–10.3)
Chloride: 100 mmol/L (ref 98–111)
Creatinine, Ser: 1.39 mg/dL — ABNORMAL HIGH (ref 0.61–1.24)
GFR, Estimated: 54 mL/min — ABNORMAL LOW (ref >60)
Glucose, Bld: 124 mg/dL — ABNORMAL HIGH (ref 70–99)
Potassium: 4.2 mmol/L (ref 3.5–5.1)
Sodium: 134 mmol/L — ABNORMAL LOW (ref 135–145)

## 2021-05-16 LAB — RESP PANEL BY RT-PCR (FLU A&B, COVID) ARPGX2
Influenza A by PCR: NEGATIVE
Influenza B by PCR: NEGATIVE
SARS Coronavirus 2 by RT PCR: NEGATIVE

## 2021-05-16 LAB — CBC
HCT: 29.9 % — ABNORMAL LOW (ref 39.0–52.0)
Hemoglobin: 9.5 g/dL — ABNORMAL LOW (ref 13.0–17.0)
MCH: 30.7 pg (ref 26.0–34.0)
MCHC: 31.8 g/dL (ref 30.0–36.0)
MCV: 96.8 fL (ref 80.0–100.0)
Platelets: 181 10*3/uL (ref 150–400)
RBC: 3.09 MIL/uL — ABNORMAL LOW (ref 4.22–5.81)
RDW: 14.4 % (ref 11.5–15.5)
WBC: 8.3 10*3/uL (ref 4.0–10.5)
nRBC: 0 % (ref 0.0–0.2)

## 2021-05-16 NOTE — Progress Notes (Signed)
[facility] PROGRESS NOTE   Francis Cardenas EZM:[id number] DOB: Oct 21, 1950 DOA: 05/14/2021  PCP: Gerome Sam, MD  Brief History/Interval Summary: 70 y.o. male with a past medical history of chronic diastolic CHF, essential hypertension, morbid obesity, obstructive sleep apnea on CPAP who was in his usual state of health till about 2 months ago when he started noticing that he was gaining weight.  He gained about 30 pounds so.  Started developing shortness of breath and decided to come to the hospital.  He was found to be fluid overloaded.  He was also recently diagnosed with atrial fibrillation by his primary care provider but has not been placed on anticoagulation yet.  Hospitalize for further management.   Reason for Visit: Acute diastolic CHF  Consultants: None  Procedures: None    Subjective/Interval History: Patient noted to be emotional this morning.  He is worried about his multiple medical problems.  He is puzzled about the fever last night.  Has had a cough but not persistent.  Denies any new complaints.  Leg swelling and shortness of breath is about the same.  No chest pain     Assessment/Plan:  Acute on chronic diastolic CHF Presented with a history of weight gain of 25 to 30 pounds in the last 2 months.  This is despite taking torsemide on a daily basis. He had an echocardiogram on December 16 at the [place]. This revealed ejection fraction of greater than 55%.  Right ventricle was normal in size and function.  RV systolic pressure was 50-35 suggesting mild to moderate pulmonary hypertension.  Diastolic function was not well evaluated due to arrhythmia.  Left atrium was noted to be mildly dilated.  No pericardial effusion was noted. Seems to be diuresing but weight measurements have been fluctuating.  Discussed with nursing staff.  They will weigh him on a scale today. Continue IV diuretics for now. Continue carvedilol.  ACE inhibitor is on hold for now as  his creatinine was initially elevated.  We will readdress ACE inhibitor in the next few days. Chest pressure is most likely due to fluid overload and perhaps an element of COPD.  EKG did not show any ischemic changes.  Troponins were normal.  Atrial fibrillation, unspecified This was diagnosed about 2 weeks ago by his primary care provider.  He has a Pharmacist, community.  TSH normal at 2.3.  CHADS2 vascular score is 2.  Currently on IV heparin infusion.   Hopefully transition to apixaban at the time of discharge.  He will then need to follow-up with his cardiologist at the [facility].  Fever Developed fever overnight.  No clear source identified.  WBC is normal.  UA was unremarkable.  Chest x-ray actually showed improvement in the vascular congestion.  No skin rashes noted.  He does have a sacral decubitus but it did not show any evidence for infection per descriptions.  He had negative influenza and COVID PCR on 12/23.  We will continue to monitor for now off of antibiotics.  May need further testing if fever recurs.  Chronic kidney disease stage IIIa Creatinine was 1.57 at the time of admission.  Noted to be stable for the most part.  Potassium is normal. Holding ACE inhibitor and spironolactone.  Hyponatremia secondary to hypovolemia Improved this morning.  Continue to monitor.  Sodium level has improved.  Continue to monitor.  Essential hypertension Blood pressure noted to be stable.  Occasional high readings are present.  Continue to monitor for now.  History  of COPD Stable.  Continue inhaler treatment.  We will add nebulizer treatment as needed.  He mentioned wheezing this morning though none heard currently.  Saturating normal on room air.  Diabetes mellitus type 2 in obese Apparently this is previous history.  He has not taken metformin in at least a year or so.  HbA1c 5.5 which is consistent with his history.  No need to check CBGs frequently.  Alcohol abuse Drinks 8 cans of beers on a  daily basis.  Continue CIWA protocol.  Continue thiamine multivitamins.  No evidence for withdrawal symptoms at this time.  Normocytic anemia No evidence of overt blood loss.  Folic acid 81.2, vitamin B12 470.  Ferritin 79, iron 26, TIBC 423% saturation 6.   Could be suggestive of iron deficiency.  Benefit from iron supplementation at discharge.  Further work-up can be pursued in the outpatient setting.  Obstructive sleep apnea Continue CPAP  Morbid obesity Estimated body mass index is 43.4 kg/m as calculated from the following:   Height as of this encounter: 5\' 10"  (1.778 m).   Weight as of this encounter: 137.2 kg.  Decubitus buttocks stage II Pressure Injury 05/15/21 Buttocks Left Stage 2 -  Partial thickness loss of dermis presenting as a shallow open injury with a red, pink wound bed without slough. bruising around the site vs. deep tissue injury (Active)  05/15/21 0300  Location: Buttocks  Location Orientation: Left  Staging: Stage 2 -  Partial thickness loss of dermis presenting as a shallow open injury with a red, pink wound bed without slough.  Wound Description (Comments): bruising around the site vs. deep tissue injury  Present on Admission: Yes (Per the Pt, he has had the sore for a while)    DVT Prophylaxis: On IV heparin Code Status: Full code Family Communication: Discussed with the patient Disposition Plan: Hopefully return home when improved.  PT and OT evaluation.  Limited mobility at baseline.  Uses a walker when at home.  Has to be on a wheelchair when outside home.  Status is: Inpatient  Remains inpatient appropriate because: Acute CHF requiring IV diuretics      Medications: Scheduled:  aspirin EC  81 mg Oral Q breakfast   atorvastatin  40 mg Oral Daily   carvedilol  12.5 mg Oral BID WC   folic acid  1 mg Oral Daily   furosemide  40 mg Intravenous Q12H   gabapentin  300 mg Oral BID   mometasone-formoterol  2 puff Inhalation BID   multivitamin with  minerals  1 tablet Oral Daily   pantoprazole  40 mg Oral Daily   sodium chloride flush  3 mL Intravenous Q12H   thiamine  100 mg Oral Daily   Or   thiamine  100 mg Intravenous Daily   Continuous:  sodium chloride     heparin 1,750 Units/hr (05/16/21 0100)   XNT:ZGYFVC chloride, acetaminophen, ALPRAZolam, LORazepam **OR** LORazepam, melatonin, ondansetron (ZOFRAN) IV, sodium chloride flush  Antibiotics: Anti-infectives (From admission, onward)    None       Objective:  Vital Signs  Vitals:   05/16/21 0100 05/16/21 0400 05/16/21 0732 05/16/21 0836  BP: (!) 134/57 (!) 151/63 (!) 136/55   Pulse: 62 60 74 65  Resp: 17 18 15 19   Temp: 99.2 F (37.3 C) 99.3 F (37.4 C) 99.5 F (37.5 C)   TempSrc: Axillary Axillary Oral   SpO2: 93% 97% 95% 94%  Weight: (!) 137.2 kg     Height:  Intake/Output Summary (Last 24 hours) at 05/16/2021 0924 Last data filed at 05/16/2021 0858 Gross per 24 hour  Intake 797.25 ml  Output 3550 ml  Net -2752.75 ml    Filed Weights   05/14/21 0617 05/15/21 0500 05/16/21 0100  Weight: (!) 145.6 kg 133.4 kg (!) 137.2 kg    General appearance: Awake alert.  In no distress Resp: Mildly tachypneic at rest.  Diminished air entry at the bases with few crackles.  No wheezing appreciated.  No rhonchi. Cardio: S1-S2 is irregularly irregular GI: Abdomen is soft.  Nontender nondistended.  Bowel sounds are present normal.  No masses organomegaly Extremities: Slight improvement in the 2+ pitting edema noted today.  No skin rashes. Neurologic: Alert and oriented x3.  No focal neurological deficits.     Lab Results:  Data Reviewed: I have personally reviewed following labs and imaging studies  CBC: Recent Labs  Lab 05/14/21 0619 05/15/21 0329 05/16/21 0030  WBC 8.3 7.9 8.3  HGB 10.3* 10.2* 9.5*  HCT 31.6* 31.4* 29.9*  MCV 93.8 94.6 96.8  PLT 191 202 181     Basic Metabolic Panel: Recent Labs  Lab 05/14/21 0619 05/15/21 0329  05/16/21 0030  NA 129* 134* 134*  K 5.2* 4.1 4.2  CL 98 100 100  CO2 24 26 25   GLUCOSE 119* 125* 124*  BUN 31* 18 20  CREATININE 1.57* 1.20 1.39*  CALCIUM 10.4* 10.4* 9.9  MG 2.0  --   --      GFR: Estimated Creatinine Clearance: 67.1 mL/min (A) (by C-G formula based on SCr of 1.39 mg/dL (H)).   HbA1C: Recent Labs    05/15/21 0329  HGBA1C 5.5      Thyroid Function Tests: Recent Labs    05/15/21 0329  TSH 2.353     Anemia Panel: Recent Labs    05/15/21 0329  VITAMINB12 470  FOLATE 26.5  FERRITIN 79  TIBC 423  IRON 26*  RETICCTPCT 2.9     Recent Results (from the past 240 hour(s))  Resp Panel by RT-PCR (Flu A&B, Covid) Nasopharyngeal Swab     Status: None   Collection Time: 05/14/21  1:46 PM   Specimen: Nasopharyngeal Swab; Nasopharyngeal(NP) swabs in vial transport medium  Result Value Ref Range Status   SARS Coronavirus 2 by RT PCR NEGATIVE NEGATIVE Final    Comment: (NOTE) SARS-CoV-2 target nucleic acids are NOT DETECTED.  The SARS-CoV-2 RNA is generally detectable in upper respiratory specimens during the acute phase of infection. The lowest concentration of SARS-CoV-2 viral copies this assay can detect is 138 copies/mL. A negative result does not preclude SARS-Cov-2 infection and should not be used as the sole basis for treatment or other patient management decisions. A negative result may occur with  improper specimen collection/handling, submission of specimen other than nasopharyngeal swab, presence of viral mutation(s) within the areas targeted by this assay, and inadequate number of viral copies(<138 copies/mL). A negative result must be combined with clinical observations, patient history, and epidemiological information. The expected result is Negative.  Fact Sheet for Patients:  EntrepreneurPulse.com.au  Fact Sheet for Healthcare Providers:  IncredibleEmployment.be  This test is no t yet approved  or cleared by the [place] FDA and  has been authorized for detection and/or diagnosis of SARS-CoV-2 by FDA under an Emergency Use Authorization (EUA). This EUA will remain  in effect (meaning this test can be used) for the duration of the COVID-19 declaration under Section 564(b)(1) of the Act, 21 U.S.C.section 360bbb-3(b)(1),  unless the authorization is terminated  or revoked sooner.       Influenza A by PCR NEGATIVE NEGATIVE Final   Influenza B by PCR NEGATIVE NEGATIVE Final    Comment: (NOTE) The Xpert Xpress SARS-CoV-2/FLU/RSV plus assay is intended as an aid in the diagnosis of influenza from Nasopharyngeal swab specimens and should not be used as a sole basis for treatment. Nasal washings and aspirates are unacceptable for Xpert Xpress SARS-CoV-2/FLU/RSV testing.  Fact Sheet for Patients: EntrepreneurPulse.com.au  Fact Sheet for Healthcare Providers: IncredibleEmployment.be  This test is not yet approved or cleared by the [place] FDA and has been authorized for detection and/or diagnosis of SARS-CoV-2 by FDA under an Emergency Use Authorization (EUA). This EUA will remain in effect (meaning this test can be used) for the duration of the COVID-19 declaration under Section 564(b)(1) of the Act, 21 U.S.C. section 360bbb-3(b)(1), unless the authorization is terminated or revoked.  Performed at [facility], [address], [place], [place] [address]   MRSA Next Gen by PCR, Nasal     Status: None   Collection Time: 05/14/21  4:50 PM   Specimen: Nasal Mucosa; Nasal Swab  Result Value Ref Range Status   MRSA by PCR Next Gen NOT DETECTED NOT DETECTED Final    Comment: (NOTE) The GeneXpert MRSA Assay (FDA approved for NASAL specimens only), is one component of a comprehensive MRSA colonization surveillance program. It is not intended to diagnose MRSA infection nor to guide or monitor treatment for MRSA  infections. Test performance is not FDA approved in patients less than 72 years old. Performed at [facility], [place] [address], [place], [place] [address]        Radiology Studies: DG CHEST PORT 1 VIEW  Result Date: 05/15/2021 CLINICAL DATA:  Fevers EXAM: PORTABLE CHEST 1 VIEW COMPARISON:  Film from the previous day. FINDINGS: Cardiac shadow is stable. Lungs are well aerated bilaterally. No focal infiltrate or sizable effusion is seen. No bony abnormality is noted. Previously seen vascular congestion has resolved. IMPRESSION: Resolution of previously seen vascular congestion. No acute abnormality noted. Electronically Signed   By: Inez Catalina M.D.   On: 05/15/2021 21:15       LOS: 2 days   [place] [facility] Pager on [email or web address]  05/16/2021, 9:24 AM

## 2021-05-16 NOTE — Plan of Care (Signed)
°  Problem: Education: Goal: Knowledge of General Education information will improve Description: Including pain rating scale, medication(s)/side effects and non-pharmacologic comfort measures Outcome: Progressing   Problem: Health Behavior/Discharge Planning: Goal: Ability to manage health-related needs will improve Outcome: Progressing   Problem: Clinical Measurements: Goal: Ability to maintain clinical measurements within normal limits will improve Outcome: Progressing Goal: Diagnostic test results will improve Outcome: Progressing Goal: Cardiovascular complication will be avoided Outcome: Progressing   Problem: Activity: Goal: Risk for activity intolerance will decrease Outcome: Progressing   Problem: Skin Integrity: Goal: Risk for impaired skin integrity will decrease Outcome: Progressing

## 2021-05-16 NOTE — Plan of Care (Signed)

## 2021-05-16 NOTE — Progress Notes (Signed)
Mobility Specialist Progress Note    05/16/21 1536  Mobility  Activity Transferred:  Bed to chair  Level of Assistance +2 (takes two people)  Assistive Device Stedy  Mobility Out of bed to chair with meals  Mobility Response Tolerated poorly  Mobility performed by Mobility specialist  Bed Position Chair  $Mobility charge 1 Mobility   Pt received in bed and agreeable to attempt standing weight. C/o 8/10 pain that elevated to 10/10 upon exertion. Pt stated he could not lift his feet onto scale. Returned to sitting. Then agreeable to use stedy while sitting EOB. Raised bed so pt did not have to lift feet as he stated it was too painful. Left in chair with call bell in reach.   [facility] Mobility Specialist  M.S. Primary Phone: [phone] M.S. Secondary Phone: [phone]

## 2021-05-16 NOTE — Progress Notes (Signed)
ANTICOAGULATION CONSULT NOTE   Pharmacy Consult for heparin Indication: atrial fibrillation  Allergies  Allergen Reactions   Oxcarbazepine Other (See Comments)    Dizziness/ lightheaded   Zolpidem Nausea Only    Patient Measurements: Height: 5\' 10"  (177.8 cm) Weight: (!) 137.2 kg (302 lb 7.5 oz) IBW/kg (Calculated) : 73 Heparin Dosing Weight: 107.6 kg  Vital Signs: Temp: 99.7 F (37.6 C) (12/25 1150) Temp Source: Oral (12/25 1150) BP: 125/45 (12/25 1150) Pulse Rate: 62 (12/25 1150)  Labs: Recent Labs    05/14/21 0619 05/14/21 1010 05/14/21 1132 05/15/21 0329 05/15/21 1301 05/16/21 0030  HGB 10.3*  --   --  10.2*  --  9.5*  HCT 31.6*  --   --  31.4*  --  29.9*  PLT 191  --   --  202  --  181  HEPARINUNFRC  --   --   --  0.21* 0.35 0.40  CREATININE 1.57*  --   --  1.20  --  1.39*  TROPONINIHS  --  13 14  --   --   --      Estimated Creatinine Clearance: 67.1 mL/min (A) (by C-G formula based on SCr of 1.39 mg/dL (H)).   Assessment: 73 yom with new Afib presenting in CHF exacerbation. No listed AC PTA. Pharmacy dosing heparin.  Heparin level at goal (0.4) on heparin gtt at 1750 units/hr. No issues with line or bleeding reported per RN. CBC stable  Discuss with MD change to oral agent if no procedures planned   Goal of Therapy:  Heparin level 0.3-0.7 units/ml Monitor platelets by anticoagulation protocol: Yes   Plan:  Continue heparin infusion 1750 units/hr Daily heparin level and CBC    Bonnita Nasuti Pharm.D. CPP, BCPS Clinical Pharmacist [phone] 05/16/2021 3:39 PM   Please see amion for complete clinical pharmacist phone list 05/16/2021 3:39 PM

## 2021-05-17 ENCOUNTER — Inpatient Hospital Stay (HOSPITAL_COMMUNITY): Payer: No Typology Code available for payment source

## 2021-05-17 DIAGNOSIS — R609 Edema, unspecified: Secondary | ICD-10-CM

## 2021-05-17 DIAGNOSIS — M79669 Pain in unspecified lower leg: Secondary | ICD-10-CM

## 2021-05-17 LAB — BASIC METABOLIC PANEL
Anion gap: 10 (ref 5–15)
BUN: 22 mg/dL (ref 8–23)
CO2: 24 mmol/L (ref 22–32)
Calcium: 9.9 mg/dL (ref 8.9–10.3)
Chloride: 100 mmol/L (ref 98–111)
Creatinine, Ser: 1.36 mg/dL — ABNORMAL HIGH (ref 0.61–1.24)
GFR, Estimated: 55 mL/min — ABNORMAL LOW (ref >60)
Glucose, Bld: 126 mg/dL — ABNORMAL HIGH (ref 70–99)
Potassium: 3.9 mmol/L (ref 3.5–5.1)
Sodium: 134 mmol/L — ABNORMAL LOW (ref 135–145)

## 2021-05-17 LAB — CBC
HCT: 29.1 % — ABNORMAL LOW (ref 39.0–52.0)
Hemoglobin: 9.5 g/dL — ABNORMAL LOW (ref 13.0–17.0)
MCH: 31 pg (ref 26.0–34.0)
MCHC: 32.6 g/dL (ref 30.0–36.0)
MCV: 95.1 fL (ref 80.0–100.0)
Platelets: 174 10*3/uL (ref 150–400)
RBC: 3.06 MIL/uL — ABNORMAL LOW (ref 4.22–5.81)
RDW: 14 % (ref 11.5–15.5)
WBC: 11.6 10*3/uL — ABNORMAL HIGH (ref 4.0–10.5)
nRBC: 0 % (ref 0.0–0.2)

## 2021-05-17 LAB — URIC ACID: Uric Acid, Serum: 11.8 mg/dL — ABNORMAL HIGH (ref 3.7–8.6)

## 2021-05-17 LAB — HEPARIN LEVEL (UNFRACTIONATED)
Heparin Unfractionated: 0.27 IU/mL — ABNORMAL LOW (ref 0.30–0.70)
Heparin Unfractionated: 0.34 IU/mL (ref 0.30–0.70)
Heparin Unfractionated: 0.35 IU/mL (ref 0.30–0.70)

## 2021-05-17 LAB — PROCALCITONIN: Procalcitonin: 0.15 ng/mL

## 2021-05-17 MED ORDER — MAGNESIUM HYDROXIDE 400 MG/5ML PO SUSP
30.0000 mL | Freq: Every day | ORAL | Status: DC | PRN
  Administered 2021-05-17: 11:00:00 30 mL via ORAL
  Filled 2021-05-17: qty 30

## 2021-05-17 MED ORDER — PREDNISONE 20 MG PO TABS
40.0000 mg | ORAL_TABLET | Freq: Two times a day (BID) | ORAL | Status: DC
  Administered 2021-05-17 – 2021-05-19 (×4): 40 mg via ORAL
  Filled 2021-05-17 (×4): qty 2

## 2021-05-17 MED ORDER — COLCHICINE 0.6 MG PO TABS
0.6000 mg | ORAL_TABLET | Freq: Every day | ORAL | Status: DC
  Administered 2021-05-17 – 2021-05-22 (×6): 0.6 mg via ORAL
  Filled 2021-05-17 (×6): qty 1

## 2021-05-17 NOTE — Progress Notes (Signed)
Mobility Specialist Progress Note    05/17/21 1303  Mobility  Activity Refused mobility   Pt said he was in too much pain and had no energy.   [facility] Mobility Specialist  M.S. Primary Phone: [phone] M.S. Secondary Phone: [phone]

## 2021-05-17 NOTE — Progress Notes (Signed)
Pt on CPAP w/ help of RN. RT will cont to monitor.

## 2021-05-17 NOTE — Progress Notes (Deleted)
Mobility Specialist Progress Note    05/17/21 1517  Mobility  Activity Refused mobility   Pt c/o having too much full body pain and not interested in mobilizing.   [facility] Mobility Specialist  M.S. Primary Phone: [phone] M.S. Secondary Phone: [phone]

## 2021-05-17 NOTE — Progress Notes (Signed)
ANTICOAGULATION CONSULT NOTE   Pharmacy Consult for heparin Indication: atrial fibrillation  Allergies  Allergen Reactions   Oxcarbazepine Other (See Comments)    Dizziness/ lightheaded   Zolpidem Nausea Only    Patient Measurements: Height: 5\' 10"  (177.8 cm) Weight: 130.3 kg (287 lb 4.2 oz) IBW/kg (Calculated) : 73 Heparin Dosing Weight: 107.6 kg  Vital Signs: Temp: 97.8 F (36.6 C) (12/26 1127) Temp Source: Oral (12/26 1127) BP: 135/47 (12/26 1127) Pulse Rate: 67 (12/26 1127)  Labs: Recent Labs    05/15/21 0329 05/15/21 1301 05/16/21 0030 05/17/21 0145 05/17/21 1147  HGB 10.2*  --  9.5* 9.5*  --   HCT 31.4*  --  29.9* 29.1*  --   PLT 202  --  181 174  --   HEPARINUNFRC 0.21*   < > 0.40 0.27* 0.34  CREATININE 1.20  --  1.39* 1.36*  --    < > = values in this interval not displayed.     Estimated Creatinine Clearance: 66.6 mL/min (A) (by C-G formula based on SCr of 1.36 mg/dL (H)).   Assessment: 45 yom with new Afib presenting in CHF exacerbation. No listed AC PTA. Pharmacy dosing heparin.  Heparin level is therapeutic now at 0.34 on 1900 units/hr. No issues with line or bleeding noted.  Goal of Therapy:  Heparin level 0.3-0.7 units/ml Monitor platelets by anticoagulation protocol: Yes   Plan:  Continue heparin infusion at 1900 units/hr Check heparin level in 6-8 hours and daily while on heparin Continue to monitor H&H and platelets    Thank you for allowing [place] to participate in this patients care. Jens Som, PharmD 05/17/2021 1:15 PM  **Pharmacist phone directory can be found on [email or web address] listed under [place]**

## 2021-05-17 NOTE — Plan of Care (Signed)
°  Problem: Education: Goal: Knowledge of General Education information will improve Description: Including pain rating scale, medication(s)/side effects and non-pharmacologic comfort measures Outcome: Not Progressing   Problem: Health Behavior/Discharge Planning: Goal: Ability to manage health-related needs will improve Outcome: Not Progressing   Problem: Clinical Measurements: Goal: Ability to maintain clinical measurements within normal limits will improve Outcome: Not Progressing Goal: Will remain free from infection Outcome: Progressing Goal: Diagnostic test results will improve Outcome: Not Progressing Goal: Respiratory complications will improve Outcome: Not Progressing Goal: Cardiovascular complication will be avoided Outcome: Not Progressing   Problem: Activity: Goal: Risk for activity intolerance will decrease Outcome: Not Progressing   Problem: Nutrition: Goal: Adequate nutrition will be maintained Outcome: Not Progressing   Problem: Coping: Goal: Level of anxiety will decrease Outcome: Not Progressing   Problem: Elimination: Goal: Will not experience complications related to bowel motility Outcome: Not Progressing Goal: Will not experience complications related to urinary retention Outcome: Not Progressing   Problem: Pain Managment: Goal: General experience of comfort will improve Outcome: Not Progressing   Problem: Safety: Goal: Ability to remain free from injury will improve Outcome: Not Progressing   Problem: Skin Integrity: Goal: Risk for impaired skin integrity will decrease Outcome: Not Progressing   Problem: Education: Goal: Ability to demonstrate management of disease process will improve Outcome: Not Progressing Goal: Ability to verbalize understanding of medication therapies will improve Outcome: Not Progressing Goal: Individualized Educational Video(s) Outcome: Not Progressing   Problem: Activity: Goal: Capacity to carry out activities  will improve Outcome: Not Progressing   Problem: Cardiac: Goal: Ability to achieve and maintain adequate cardiopulmonary perfusion will improve Outcome: Not Progressing

## 2021-05-17 NOTE — Plan of Care (Signed)
°  Problem: Education: Goal: Knowledge of General Education information will improve Description: Including pain rating scale, medication(s)/side effects and non-pharmacologic comfort measures Outcome: Progressing   Problem: Health Behavior/Discharge Planning: Goal: Ability to manage health-related needs will improve Outcome: Progressing   Problem: Clinical Measurements: Goal: Ability to maintain clinical measurements within normal limits will improve Outcome: Progressing Goal: Diagnostic test results will improve Outcome: Progressing   Problem: Activity: Goal: Risk for activity intolerance will decrease Outcome: Progressing   Problem: Elimination: Goal: Will not experience complications related to bowel motility Outcome: Progressing   Problem: Pain Managment: Goal: General experience of comfort will improve Outcome: Progressing   Problem: Skin Integrity: Goal: Risk for impaired skin integrity will decrease Outcome: Progressing

## 2021-05-17 NOTE — TOC Progression Note (Signed)
Transition of Care [facility]) - Progression Note    Patient Details  Name: Francis Cardenas MRN: [id number] Date of Birth: 1951/01/30  Transition of Care [facility]) CM/SW Contact  [name], RN Phone Number: 05/17/2021, 4:04 PM  Clinical Narrative:     Transition of Care [facility]) Screening Note   Patient Details  Name: Francis Cardenas Date of Birth: 02/20/1951   Transition of Care [facility]) CM/SW Contact:    [name], RN Phone Number: 05/17/2021, 4:04 PM    Transition of Care Department [facility]) has reviewed patient and no TOC needs have been identified at this time. We will continue to monitor patient advancement through interdisciplinary progression rounds. If new patient transition needs arise, please place a TOC consult.          Expected Discharge Plan and Services                                                 Social Determinants of Health (SDOH) Interventions    Readmission Risk Interventions Readmission Risk Prevention Plan 03/21/2019 03/21/2019  Transportation Screening - Complete  PCP or Specialist Appt within 3-5 Days - (No Data)  [place] or [place] - Complete  Social Work Consult for [place] Planning/Counseling - Patient refused  Palliative Care Screening - Not Applicable  Medication Review (RN Care Manager) Referral to Pharmacy Complete  Some recent data might be hidden

## 2021-05-17 NOTE — Plan of Care (Signed)

## 2021-05-17 NOTE — Progress Notes (Signed)
Lower extremity venous has been completed.   Preliminary results in CV Proc.   Jinny Blossom Lejla Moeser 05/17/2021 12:09 PM

## 2021-05-17 NOTE — Progress Notes (Signed)
[facility] PROGRESS NOTE   Francis Cardenas KGY:[id number] DOB: 01/06/1951 DOA: 05/14/2021  PCP: Gerome Sam, MD  Brief History/Interval Summary: 70 y.o. male with a past medical history of chronic diastolic CHF, essential hypertension, morbid obesity, obstructive sleep apnea on CPAP who was in his usual state of health till about 2 months ago when he started noticing that he was gaining weight.  He gained about 30 pounds so.  Started developing shortness of breath and decided to come to the hospital.  He was found to be fluid overloaded.  He was also recently diagnosed with atrial fibrillation by his primary care provider but has not been placed on anticoagulation yet.  Hospitalize for further management.   Reason for Visit: Acute diastolic CHF  Consultants: None  Procedures: None    Subjective/Interval History: Patient noted to be in better spirits this morning.  Cough is mild.  Denies any chest pain.  No nausea vomiting.  Denies any skin rashes.  No dysuria.  No diarrhea.  But does mention joint pains involving multiple joints including left elbow and wrist right knee right ankle and left ankle.  He reports a remote history of gout.      Assessment/Plan:  Acute on chronic diastolic CHF Presented with a history of weight gain of 25 to 30 pounds in the last 2 months.  This is despite taking torsemide on a daily basis. He had an echocardiogram on December 16 at the [facility]. This revealed ejection fraction of greater than 55%.  Right ventricle was normal in size and function.  RV systolic pressure was 02-63 suggesting mild to moderate pulmonary hypertension.  Diastolic function was not well evaluated due to arrhythmia.  Left atrium was noted to be mildly dilated.  No pericardial effusion was noted. Seems to be diuresing well.  Weight measurements are fluctuating and do not seem to be accurate.  Because of his joint pains he has not been able to stand on a weighing scale.    Continue IV diuretics for now. Continue carvedilol. ACE inhibitor is on hold for now as his creatinine was initially elevated.  We will readdress ACE inhibitor in the next few days. Chest pressure is most likely due to fluid overload and perhaps an element of COPD.  EKG did not show any ischemic changes.  Troponins were normal.  Atrial fibrillation, unspecified This was diagnosed about 2 weeks prior to this admission by his primary care provider.  He has a Pharmacist, community.  TSH normal at 2.3.  CHADS2 vascular score is 2.  Currently on IV heparin infusion.   Hopefully transition to apixaban at the time of discharge.  He will then need to follow-up with his cardiologist at the [facility].  Fever No clear source of his fever has been found.  WBC is noted to be slightly elevated today.  UA was unremarkable.  Chest x-ray actually showed improvement.  He does not have any skin rashes.  No evidence for infection of the sacral decubitus based on descriptions. COVID-19 and influenza PCR was repeated and again noted to be negative. He does report polyarthralgia today.  Reports remote history of gout.  This could all be secondary to acute gout since he has been on diuretics.  We will check a uric acid level.  We will check procalcitonin.  May need to put him on steroids.   Will also order lower extremity Doppler studies  Chronic kidney disease stage IIIa Creatinine was 1.57 at the time of admission.  Renal functions noted to be stable for the most part.  Potassium noted to be stable.  Continue to monitor urine output.  Holding his ACE inhibitor and spironolactone   Hyponatremia secondary to hypervolemia Sodium level has improved and remains stable.  Continue to monitor periodically.  Essential hypertension Stable.  Continue to monitor.  History of COPD Stable.  Continue nebulizer treatments as needed.  Saturating normal on room air.  No wheezing appreciated.  Diabetes mellitus type 2 in  obese Apparently this is previous history.  He has not taken metformin in at least a year or so.  HbA1c 5.5 which is consistent with his history.  No need to check CBGs frequently.  Alcohol abuse Drinks 8 cans of beers on a daily basis.  Continue CIWA protocol.  Continue thiamine multivitamins.  No evidence for withdrawal symptoms at this time.  Normocytic anemia No evidence of overt blood loss.  Folic acid 18.5, vitamin B12 470.  Ferritin 79, iron 26, TIBC 423% saturation 6.   Could be suggestive of iron deficiency.  Benefit from iron supplementation at discharge.  Further work-up can be pursued in the outpatient setting.  Obstructive sleep apnea Continue CPAP  Morbid obesity Estimated body mass index is 41.22 kg/m as calculated from the following:   Height as of this encounter: 5\' 10"  (1.778 m).   Weight as of this encounter: 130.3 kg.  Decubitus buttocks stage II Pressure Injury 05/15/21 Buttocks Left Stage 2 -  Partial thickness loss of dermis presenting as a shallow open injury with a red, pink wound bed without slough. bruising around the site vs. deep tissue injury (Active)  05/15/21 0300  Location: Buttocks  Location Orientation: Left  Staging: Stage 2 -  Partial thickness loss of dermis presenting as a shallow open injury with a red, pink wound bed without slough.  Wound Description (Comments): bruising around the site vs. deep tissue injury  Present on Admission: Yes (Per the Pt, he has had the sore for a while)    DVT Prophylaxis: On IV heparin Code Status: Full code Family Communication: Discussed with the patient Disposition Plan: Hopefully return home when improved.  PT and OT evaluation.  Limited mobility at baseline.  Uses a walker when at home.  Has to be on a wheelchair when outside home.  Status is: Inpatient  Remains inpatient appropriate because: Acute CHF requiring IV diuretics      Medications: Scheduled:  aspirin EC  81 mg Oral Q breakfast    atorvastatin  40 mg Oral Daily   carvedilol  12.5 mg Oral BID WC   folic acid  1 mg Oral Daily   furosemide  40 mg Intravenous Q12H   gabapentin  300 mg Oral BID   mometasone-formoterol  2 puff Inhalation BID   multivitamin with minerals  1 tablet Oral Daily   pantoprazole  40 mg Oral Daily   sodium chloride flush  3 mL Intravenous Q12H   thiamine  100 mg Oral Daily   Or   thiamine  100 mg Intravenous Daily   Continuous:  sodium chloride     heparin 1,900 Units/hr (05/17/21 0747)   UDJ:SHFWYO chloride, acetaminophen, ALPRAZolam, LORazepam **OR** LORazepam, melatonin, ondansetron (ZOFRAN) IV, sodium chloride flush  Antibiotics: Anti-infectives (From admission, onward)    None       Objective:  Vital Signs  Vitals:   05/17/21 0040 05/17/21 0428 05/17/21 0743 05/17/21 0807  BP: 126/64 134/69 (!) 131/50   Pulse: 61 (!) 57 68 64  Resp: 20 20 16 12   Temp: 98.1 F (36.7 C) 100.3 F (37.9 C) 99.8 F (37.7 C)   TempSrc: Oral Oral Oral   SpO2: 99% 96% (!) 89% 94%  Weight:  130.3 kg    Height:        Intake/Output Summary (Last 24 hours) at 05/17/2021 0954 Last data filed at 05/17/2021 0900 Gross per 24 hour  Intake 884.45 ml  Output 1200 ml  Net -315.55 ml    Filed Weights   05/15/21 0500 05/16/21 0100 05/17/21 0428  Weight: 133.4 kg (!) 137.2 kg 130.3 kg    General appearance: Awake alert.  In no distress Resp: Normal effort at rest today.  Coarse breath sounds bilaterally.  No wheezing. Cardio: S1-S2 is normal regular.  No S3-S4.  No rubs murmurs or bruit GI: Abdomen is soft.  Nontender nondistended.  Bowel sounds are present normal.  No masses organomegaly Extremities: Edema is improving.  Limited range of motion of right ankle, left ankle, left elbow and left wrist.  No erythema noted.  It is tender for him to try and move these joints. Neurologic: Alert and oriented x3.  No focal neurological deficits.     Lab Results:  Data Reviewed: I have personally  reviewed following labs and imaging studies  CBC: Recent Labs  Lab 05/14/21 0619 05/15/21 0329 05/16/21 0030 05/17/21 0145  WBC 8.3 7.9 8.3 11.6*  HGB 10.3* 10.2* 9.5* 9.5*  HCT 31.6* 31.4* 29.9* 29.1*  MCV 93.8 94.6 96.8 95.1  PLT 191 202 181 174     Basic Metabolic Panel: Recent Labs  Lab 05/14/21 0619 05/15/21 0329 05/16/21 0030 05/17/21 0145  NA 129* 134* 134* 134*  K 5.2* 4.1 4.2 3.9  CL 98 100 100 100  CO2 24 26 25 24   GLUCOSE 119* 125* 124* 126*  BUN 31* 18 20 22   CREATININE 1.57* 1.20 1.39* 1.36*  CALCIUM 10.4* 10.4* 9.9 9.9  MG 2.0  --   --   --      GFR: Estimated Creatinine Clearance: 66.6 mL/min (A) (by C-G formula based on SCr of 1.36 mg/dL (H)).   HbA1C: Recent Labs    05/15/21 0329  HGBA1C 5.5      Thyroid Function Tests: Recent Labs    05/15/21 0329  TSH 2.353     Anemia Panel: Recent Labs    05/15/21 0329  VITAMINB12 470  FOLATE 26.5  FERRITIN 79  TIBC 423  IRON 26*  RETICCTPCT 2.9     Recent Results (from the past 240 hour(s))  Resp Panel by RT-PCR (Flu A&B, Covid) Nasopharyngeal Swab     Status: None   Collection Time: 05/14/21  1:46 PM   Specimen: Nasopharyngeal Swab; Nasopharyngeal(NP) swabs in vial transport medium  Result Value Ref Range Status   SARS Coronavirus 2 by RT PCR NEGATIVE NEGATIVE Final    Comment: (NOTE) SARS-CoV-2 target nucleic acids are NOT DETECTED.  The SARS-CoV-2 RNA is generally detectable in upper respiratory specimens during the acute phase of infection. The lowest concentration of SARS-CoV-2 viral copies this assay can detect is 138 copies/mL. A negative result does not preclude SARS-Cov-2 infection and should not be used as the sole basis for treatment or other patient management decisions. A negative result may occur with  improper specimen collection/handling, submission of specimen other than nasopharyngeal swab, presence of viral mutation(s) within the areas targeted by this  assay, and inadequate number of viral copies(<138 copies/mL). A negative result must be combined with  clinical observations, patient history, and epidemiological information. The expected result is Negative.  Fact Sheet for Patients:  EntrepreneurPulse.com.au  Fact Sheet for Healthcare Providers:  IncredibleEmployment.be  This test is no t yet approved or cleared by the [place] FDA and  has been authorized for detection and/or diagnosis of SARS-CoV-2 by FDA under an Emergency Use Authorization (EUA). This EUA will remain  in effect (meaning this test can be used) for the duration of the COVID-19 declaration under Section 564(b)(1) of the Act, 21 U.S.C.section 360bbb-3(b)(1), unless the authorization is terminated  or revoked sooner.       Influenza A by PCR NEGATIVE NEGATIVE Final   Influenza B by PCR NEGATIVE NEGATIVE Final    Comment: (NOTE) The Xpert Xpress SARS-CoV-2/FLU/RSV plus assay is intended as an aid in the diagnosis of influenza from Nasopharyngeal swab specimens and should not be used as a sole basis for treatment. Nasal washings and aspirates are unacceptable for Xpert Xpress SARS-CoV-2/FLU/RSV testing.  Fact Sheet for Patients: EntrepreneurPulse.com.au  Fact Sheet for Healthcare Providers: IncredibleEmployment.be  This test is not yet approved or cleared by the [place] FDA and has been authorized for detection and/or diagnosis of SARS-CoV-2 by FDA under an Emergency Use Authorization (EUA). This EUA will remain in effect (meaning this test can be used) for the duration of the COVID-19 declaration under Section 564(b)(1) of the Act, 21 U.S.C. section 360bbb-3(b)(1), unless the authorization is terminated or revoked.  Performed at [facility], [address], [place], [place] [address]   MRSA Next Gen by PCR, Nasal     Status: None   Collection Time:  05/14/21  4:50 PM   Specimen: Nasal Mucosa; Nasal Swab  Result Value Ref Range Status   MRSA by PCR Next Gen NOT DETECTED NOT DETECTED Final    Comment: (NOTE) The GeneXpert MRSA Assay (FDA approved for NASAL specimens only), is one component of a comprehensive MRSA colonization surveillance program. It is not intended to diagnose MRSA infection nor to guide or monitor treatment for MRSA infections. Test performance is not FDA approved in patients less than 39 years old. Performed at [facility], [place] [address], [place], [place] [address]   Culture, blood (routine x 2)     Status: None (Preliminary result)   Collection Time: 05/15/21  8:45 PM   Specimen: BLOOD RIGHT HAND  Result Value Ref Range Status   Specimen Description BLOOD RIGHT HAND  Final   Special Requests AEROBIC BOTTLE ONLY Blood Culture adequate volume  Final   Culture   Final    NO GROWTH 2 DAYS Performed at [facility], [place] [address] [place], [place] [address]    Report Status PENDING  Incomplete  Culture, blood (routine x 2)     Status: None (Preliminary result)   Collection Time: 05/15/21  8:45 PM   Specimen: BLOOD RIGHT HAND  Result Value Ref Range Status   Specimen Description BLOOD RIGHT HAND  Final   Special Requests AEROBIC BOTTLE ONLY Blood Culture adequate volume  Final   Culture   Final    NO GROWTH 2 DAYS Performed at [facility], [place] [address], [place], [place] [address]    Report Status PENDING  Incomplete  Urine Culture     Status: None (Preliminary result)   Collection Time: 05/15/21 10:04 PM   Specimen: Urine, Clean Catch  Result Value Ref Range Status   Specimen Description URINE, CLEAN CATCH  Final  Special Requests NONE  Final   Culture   Final    CULTURE REINCUBATED FOR BETTER GROWTH Performed at [facility], [place] [address] [place], [place] [address]    Report Status PENDING  Incomplete  Resp Panel by RT-PCR (Flu A&B, Covid) Nasopharyngeal Swab      Status: None   Collection Time: 05/16/21  8:00 PM   Specimen: Nasopharyngeal Swab; Nasopharyngeal(NP) swabs in vial transport medium  Result Value Ref Range Status   SARS Coronavirus 2 by RT PCR NEGATIVE NEGATIVE Final    Comment: (NOTE) SARS-CoV-2 target nucleic acids are NOT DETECTED.  The SARS-CoV-2 RNA is generally detectable in upper respiratory specimens during the acute phase of infection. The lowest concentration of SARS-CoV-2 viral copies this assay can detect is 138 copies/mL. A negative result does not preclude SARS-Cov-2 infection and should not be used as the sole basis for treatment or other patient management decisions. A negative result may occur with  improper specimen collection/handling, submission of specimen other than nasopharyngeal swab, presence of viral mutation(s) within the areas targeted by this assay, and inadequate number of viral copies(<138 copies/mL). A negative result must be combined with clinical observations, patient history, and epidemiological information. The expected result is Negative.  Fact Sheet for Patients:  EntrepreneurPulse.com.au  Fact Sheet for Healthcare Providers:  IncredibleEmployment.be  This test is no t yet approved or cleared by the [place] FDA and  has been authorized for detection and/or diagnosis of SARS-CoV-2 by FDA under an Emergency Use Authorization (EUA). This EUA will remain  in effect (meaning this test can be used) for the duration of the COVID-19 declaration under Section 564(b)(1) of the Act, 21 U.S.C.section 360bbb-3(b)(1), unless the authorization is terminated  or revoked sooner.       Influenza A by PCR NEGATIVE NEGATIVE Final   Influenza B by PCR NEGATIVE NEGATIVE Final    Comment: (NOTE) The Xpert Xpress SARS-CoV-2/FLU/RSV plus assay is intended as an aid in the diagnosis of influenza from Nasopharyngeal swab specimens and should not be used as a sole basis  for treatment. Nasal washings and aspirates are unacceptable for Xpert Xpress SARS-CoV-2/FLU/RSV testing.  Fact Sheet for Patients: EntrepreneurPulse.com.au  Fact Sheet for Healthcare Providers: IncredibleEmployment.be  This test is not yet approved or cleared by the [place] FDA and has been authorized for detection and/or diagnosis of SARS-CoV-2 by FDA under an Emergency Use Authorization (EUA). This EUA will remain in effect (meaning this test can be used) for the duration of the COVID-19 declaration under Section 564(b)(1) of the Act, 21 U.S.C. section 360bbb-3(b)(1), unless the authorization is terminated or revoked.  Performed at [facility], [place] [address] [place], [place] [address]        Radiology Studies: DG CHEST PORT 1 VIEW  Result Date: 05/15/2021 CLINICAL DATA:  Fevers EXAM: PORTABLE CHEST 1 VIEW COMPARISON:  Film from the previous day. FINDINGS: Cardiac shadow is stable. Lungs are well aerated bilaterally. No focal infiltrate or sizable effusion is seen. No bony abnormality is noted. Previously seen vascular congestion has resolved. IMPRESSION: Resolution of previously seen vascular congestion. No acute abnormality noted. Electronically Signed   By: Inez Catalina M.D.   On: 05/15/2021 21:15       LOS: 3 days   [facility] Pager on [email or web address]  05/17/2021, 9:54 AM

## 2021-05-17 NOTE — Progress Notes (Signed)
ANTICOAGULATION CONSULT NOTE   Pharmacy Consult for heparin Indication: atrial fibrillation  Allergies  Allergen Reactions   Oxcarbazepine Other (See Comments)    Dizziness/ lightheaded   Zolpidem Nausea Only    Patient Measurements: Height: 5\' 10"  (177.8 cm) Weight: (!) 137.2 kg (302 lb 7.5 oz) IBW/kg (Calculated) : 73 Heparin Dosing Weight: 107.6 kg  Vital Signs: Temp: 98.1 F (36.7 C) (12/26 0040) Temp Source: Oral (12/26 0040) BP: 126/64 (12/26 0040) Pulse Rate: 61 (12/26 0040)  Labs: Recent Labs    05/14/21 0619 05/14/21 1010 05/14/21 1132 05/15/21 0329 05/15/21 1301 05/16/21 0030 05/17/21 0145  HGB  --   --   --  10.2*  --  9.5* 9.5*  HCT  --   --   --  31.4*  --  29.9* 29.1*  PLT  --   --   --  202  --  181 174  HEPARINUNFRC   < >  --   --  0.21* 0.35 0.40 0.27*  CREATININE  --   --   --  1.20  --  1.39* 1.36*  TROPONINIHS  --  13 14  --   --   --   --    < > = values in this interval not displayed.     Estimated Creatinine Clearance: 68.5 mL/min (A) (by C-G formula based on SCr of 1.36 mg/dL (H)).   Assessment: 71 yom with new Afib presenting in CHF exacerbation. No listed AC PTA. Pharmacy dosing heparin.  Heparin level down to slightly subtherapeutic (0.27) on gtt at 1750 units/hr. No issues with line or bleeding reported per RN.  Goal of Therapy:  Heparin level 0.3-0.7 units/ml Monitor platelets by anticoagulation protocol: Yes   Plan:  Increase heparin heparin infusion to 1900 units/hr F/u 8 hr heparin level  Sherlon Handing, PharmD, BCPS Please see amion for complete clinical pharmacist phone list 05/17/2021 3:37 AM

## 2021-05-18 ENCOUNTER — Other Ambulatory Visit (HOSPITAL_COMMUNITY): Payer: Self-pay

## 2021-05-18 DIAGNOSIS — M109 Gout, unspecified: Secondary | ICD-10-CM

## 2021-05-18 LAB — BASIC METABOLIC PANEL
Anion gap: 8 (ref 5–15)
BUN: 26 mg/dL — ABNORMAL HIGH (ref 8–23)
CO2: 26 mmol/L (ref 22–32)
Calcium: 10.1 mg/dL (ref 8.9–10.3)
Chloride: 95 mmol/L — ABNORMAL LOW (ref 98–111)
Creatinine, Ser: 1.38 mg/dL — ABNORMAL HIGH (ref 0.61–1.24)
GFR, Estimated: 54 mL/min — ABNORMAL LOW (ref >60)
Glucose, Bld: 210 mg/dL — ABNORMAL HIGH (ref 70–99)
Potassium: 4.3 mmol/L (ref 3.5–5.1)
Sodium: 129 mmol/L — ABNORMAL LOW (ref 135–145)

## 2021-05-18 LAB — CBC
HCT: 28.1 % — ABNORMAL LOW (ref 39.0–52.0)
Hemoglobin: 9.4 g/dL — ABNORMAL LOW (ref 13.0–17.0)
MCH: 31.3 pg (ref 26.0–34.0)
MCHC: 33.5 g/dL (ref 30.0–36.0)
MCV: 93.7 fL (ref 80.0–100.0)
Platelets: 184 10*3/uL (ref 150–400)
RBC: 3 MIL/uL — ABNORMAL LOW (ref 4.22–5.81)
RDW: 13.6 % (ref 11.5–15.5)
WBC: 10 10*3/uL (ref 4.0–10.5)
nRBC: 0 % (ref 0.0–0.2)

## 2021-05-18 LAB — GLUCOSE, CAPILLARY
Glucose-Capillary: 189 mg/dL — ABNORMAL HIGH (ref 70–99)
Glucose-Capillary: 223 mg/dL — ABNORMAL HIGH (ref 70–99)
Glucose-Capillary: 219 mg/dL — ABNORMAL HIGH (ref 70–99)

## 2021-05-18 LAB — HEPARIN LEVEL (UNFRACTIONATED)
Heparin Unfractionated: <0.1 IU/mL — ABNORMAL LOW (ref 0.30–0.70)
Heparin Unfractionated: 0.34 IU/mL (ref 0.30–0.70)

## 2021-05-18 MED ORDER — INSULIN ASPART 100 UNIT/ML IJ SOLN
0.0000 [IU] | Freq: Three times a day (TID) | INTRAMUSCULAR | Status: DC
  Administered 2021-05-18: 17:00:00 5 [IU] via SUBCUTANEOUS
  Administered 2021-05-18 – 2021-05-19 (×2): 3 [IU] via SUBCUTANEOUS
  Administered 2021-05-19: 13:00:00 5 [IU] via SUBCUTANEOUS
  Administered 2021-05-19: 17:00:00 8 [IU] via SUBCUTANEOUS
  Administered 2021-05-20: 06:00:00 3 [IU] via SUBCUTANEOUS
  Administered 2021-05-20: 12:00:00 5 [IU] via SUBCUTANEOUS
  Administered 2021-05-20: 17:00:00 8 [IU] via SUBCUTANEOUS
  Administered 2021-05-21 (×2): 5 [IU] via SUBCUTANEOUS
  Administered 2021-05-21 – 2021-05-22 (×2): 2 [IU] via SUBCUTANEOUS

## 2021-05-18 MED ORDER — APIXABAN 5 MG PO TABS
5.0000 mg | ORAL_TABLET | Freq: Two times a day (BID) | ORAL | Status: DC
  Administered 2021-05-18 – 2021-05-22 (×9): 5 mg via ORAL
  Filled 2021-05-18 (×9): qty 1

## 2021-05-18 MED ORDER — INSULIN ASPART 100 UNIT/ML IJ SOLN
0.0000 [IU] | Freq: Every day | INTRAMUSCULAR | Status: DC
  Administered 2021-05-18 – 2021-05-20 (×3): 2 [IU] via SUBCUTANEOUS

## 2021-05-18 NOTE — Progress Notes (Signed)
Occupational Therapy Treatment Patient Details Name: Francis Cardenas MRN: [id number] DOB: 10-23-1950 Today's Date: 05/18/2021   History of present illness Pt is a 70 y.o. male admitted 05/13/21 with weight gain, SOB; workup for CHF exacerbation. Course complicated by fever and pain in multiple joints, suspect acute gout. PMH includes HTN, CHF, OSA on CPAP, obesity.   OT comments  Pt progressing towards acute OT goals. Heavy mod A +2 to stand this session. Unable to sidestep or weight-shift in standing. Updated d/c recommendation to ST SNF for rehab.   Recommendations for follow up therapy are one component of a multi-disciplinary discharge planning process, led by the attending physician.  Recommendations may be updated based on patient status, additional functional criteria and insurance authorization.    Follow Up Recommendations  Skilled nursing-short term rehab (<3 hours/day)    Assistance Recommended at Discharge Frequent or constant Supervision/Assistance  Equipment Recommendations  Other (comment) (defer to next venue)    Recommendations for Other Services      Precautions / Restrictions Precautions Precautions: Fall Restrictions Weight Bearing Restrictions: No       Mobility Bed Mobility Overal bed mobility: Needs Assistance Bed Mobility: Supine to Sit;Sit to Supine     Supine to sit: Mod assist;HOB elevated Sit to supine: Mod assist   General bed mobility comments: ModA for HHA to elevate trunk, significant increased time and effort to scoot hips to EOB with cues for sequencing; modA for BLE management with return to supine    Transfers Overall transfer level: Needs assistance Equipment used: Rollator (4 wheels) Transfers: Sit to/from Stand Sit to Stand: Mod assist;+2 physical assistance;+2 safety/equipment;From elevated surface           General transfer comment: ModA+2 for trunk elevation standing from raised EOB to rollator, pt with difficulty  maintaining LUE grip on rollator due to pain requiring max cues for safety and sequencing, difficulty achieving fully upright posture due to c/o pain; unable to shift weight in order to take complete steps despite assist to maintain upright     Balance Overall balance assessment: Needs assistance Sitting-balance support: Feet supported Sitting balance-Leahy Scale: Fair Sitting balance - Comments: requires assist to don shoes   Standing balance support: Reliant on assistive device for balance;Bilateral upper extremity supported Standing balance-Leahy Scale: Poor Standing balance comment: reliant on BUE support and external assist to maintain static standing                           ADL either performed or assessed with clinical judgement   ADL Overall ADL's : Needs assistance/impaired                                       General ADL Comments: Pt completed bed mobility, assist to fully don shoes sitting EOB. stood 1x from elevated EOB height. Unable to sidestep d/t pain and weakness.    Extremity/Trunk Assessment Upper Extremity Assessment Upper Extremity Assessment: Generalized weakness;LUE deficits/detail LUE Deficits / Details: painful LUE d/t gout impacting ability to grip rw LUE Coordination: decreased fine motor;decreased gross motor   Lower Extremity Assessment Lower Extremity Assessment: Defer to PT evaluation        Vision       Perception     Praxis      Cognition Arousal/Alertness: Awake/alert Behavior During Therapy: WFL for tasks assessed/performed Overall Cognitive Status: Within  Functional Limits for tasks assessed                                 General Comments: some insight into deficts regarding plan for what he needs at d/c to return home\          Exercises     Shoulder Instructions       General Comments recommendations from [facility] to SNF since pt requiring significantly increased assist - pt in  agreement (treatment team notified)    Pertinent Vitals/ Pain       Pain Assessment: Faces Faces Pain Scale: Hurts even more Pain Location: multiple joints - especially L elbow/wrist, bilateral feet/ankles Pain Descriptors / Indicators: Discomfort;Grimacing;Guarding Pain Intervention(s): Limited activity within patient's tolerance;Monitored during session  Home Living                                          Prior Functioning/Environment              Frequency  Min 2X/week        Progress Toward Goals  OT Goals(current goals can now be found in the care plan section)  Progress towards OT goals: Progressing toward goals  Acute Rehab OT Goals Patient Stated Goal: home, open to ST SNF for rehab OT Goal Formulation: With patient Time For Goal Achievement: 05/29/21 Potential to Achieve Goals: Good ADL Goals Pt Will Perform Grooming: with set-up;standing Pt Will Perform Upper Body Bathing: with set-up;sitting Pt Will Perform Upper Body Dressing: with set-up;sitting Pt Will Perform Lower Body Dressing: with set-up;sit to/from stand;with adaptive equipment Pt Will Transfer to Toilet: with modified independence;regular height toilet;ambulating Pt/caregiver will Perform Home Exercise Program: Increased strength;Both right and left upper extremity;With theraband;With written HEP provided  Plan Discharge plan needs to be updated    Co-evaluation    PT/OT/SLP Co-Evaluation/Treatment: Yes Reason for Co-Treatment: For patient/therapist safety   OT goals addressed during session: ADL's and self-care;Strengthening/ROM      AM-PAC OT "6 Clicks" Daily Activity     Outcome Measure   Help from another person eating meals?: None Help from another person taking care of personal grooming?: None Help from another person toileting, which includes using toliet, bedpan, or urinal?: A Little Help from another person bathing (including washing, rinsing, drying)?: A  Lot Help from another person to put on and taking off regular upper body clothing?: A Little Help from another person to put on and taking off regular lower body clothing?: A Lot 6 Click Score: 18    End of Session Equipment Utilized During Treatment: Rollator (4 wheels)  OT Visit Diagnosis: Unsteadiness on feet (R26.81);Muscle weakness (generalized) (M62.81)   Activity Tolerance Patient limited by pain   Patient Left in bed;with call bell/phone within reach;with bed alarm set   Nurse Communication          Time: [phone] OT Time Calculation (min): 23 min  Charges: OT General Charges $OT Visit: 1 Visit OT Treatments $Self Care/Home Management : 8-22 mins  Tyrone Schimke, [place] Office: [phone]   Hortencia Pilar 05/18/2021, 1:32 PM

## 2021-05-18 NOTE — NC FL2 (Signed)
[place] LEVEL OF CARE SCREENING TOOL     IDENTIFICATION  Patient Name: Francis Cardenas Birthdate: Apr 06, 1951 Sex: male Admission Date (Current Location): 05/14/2021  [facility] and [place] Number:  Herbalist and Address:  [facility] [place], [place] [address] [place], [place] [address]      Provider Number: [id number]  Attending Physician Name and Address:  Bonnielee Haff, MD  Relative Name and Phone Number:  Lanorris, Kalisz (Spouse)   [phone]    Current Level of Care: Hospital Recommended Level of Care: [place] Prior Approval Number:    Date Approved/Denied:   PASRR Number: Passr Pending  Discharge Plan: SNF    Current Diagnoses: Patient Active Problem List   Diagnosis Date Noted   Pressure injury of skin 05/15/2021   Prostate cancer ([place]) 95/63/8756   Acute diastolic CHF (congestive heart failure) ([place]) 05/14/2021   Unspecified atrial fibrillation ([place]) 43/32/9518   Acute diastolic (congestive) heart failure ([place]) 05/14/2021   Hypertensive urgency 04/06/2019   Elevated troponin 04/06/2019   Community acquired pneumonia    COPD with acute exacerbation ([place]) 12/12/2018   Morbid obesity ([place]) 12/12/2018   Chronic diastolic CHF (congestive heart failure) ([place]) 12/10/2018   Fall 12/10/2018   Multiple rib fractures 12/10/2018   OSA on CPAP 12/10/2018   Hypokalemia 12/10/2018   Class 2 severe obesity due to excess calories with serious comorbidity and body mass index (BMI) of 37.0 to 37.9 in adult [facility])    CHF (congestive heart failure) ([place]) 01/28/2018   Acute CHF (congestive heart failure) ([place]) 12/31/2017   Acute respiratory failure with hypoxia (HCC) 12/30/2017   Chest pain syndrome 12/30/2017   Carcinoma of prostate ([place]) 08/02/2016   COPD (chronic obstructive pulmonary disease) ([place]) 08/02/2016   GERD (gastroesophageal reflux disease) 08/02/2016   Hyperlipidemia 08/02/2016   Peripheral neuropathy  08/02/2016   Steatosis of liver 08/02/2016   Diabetes mellitus type 2 in obese ([place]) 08/02/2016   Benign essential hypertension 06/11/2013   Alcohol dependence ([place]) 03/26/2013   PTSD (post-traumatic stress disorder) 03/26/2013   Alcohol abuse 03/24/2013   Depressive disorder 03/24/2013    Orientation RESPIRATION BLADDER Height & Weight     Self, Time, Situation, Place  Normal Continent Weight: 287 lb 4.2 oz (130.3 kg) Height:  5\' 10"  (177.8 cm)  BEHAVIORAL SYMPTOMS/MOOD NEUROLOGICAL BOWEL NUTRITION STATUS      Continent Diet (See DC Summary)  AMBULATORY STATUS COMMUNICATION OF NEEDS Skin   Extensive Assist Verbally Surgical wounds                       Personal Care Assistance Level of Assistance  Bathing, Feeding, Dressing Bathing Assistance: Maximum assistance Feeding assistance: Independent Dressing Assistance: Maximum assistance     Functional Limitations Info  Sight, Hearing, Speech Sight Info: Adequate Hearing Info: Adequate Speech Info: Adequate    SPECIAL CARE FACTORS FREQUENCY                       Contractures Contractures Info: Not present    Additional Factors Info  Code Status, Allergies Code Status Info: Full Allergies Info: Oxcarbazepine, Zolpidem           Current Medications (05/18/2021):  This is the current hospital active medication list Current Facility-Administered Medications  Medication Dose Route Frequency Provider Last Rate Last Admin   0.9 %  sodium chloride infusion  250 mL Intravenous PRN Bonnielee Haff, MD  acetaminophen (TYLENOL) tablet 650 mg  650 mg Oral Q4H PRN Etta Quill, DO   650 mg at 05/17/21 1437   ALPRAZolam (XANAX) tablet 1 mg  1 mg Oral QHS PRN Bonnielee Haff, MD       apixaban Arne Cleveland) tablet 5 mg  5 mg Oral BID Einar Grad, RPH   5 mg at 05/18/21 1219   aspirin EC tablet 81 mg  81 mg Oral Q breakfast Bonnielee Haff, MD   81 mg at 05/18/21 0616   atorvastatin (LIPITOR) tablet 40 mg   40 mg Oral Daily Bonnielee Haff, MD   40 mg at 05/18/21 1003   carvedilol (COREG) tablet 12.5 mg  12.5 mg Oral BID WC Bonnielee Haff, MD   12.5 mg at 05/18/21 4008   colchicine tablet 0.6 mg  0.6 mg Oral Daily Bonnielee Haff, MD   0.6 mg at 67/61/95 0932   folic acid (FOLVITE) tablet 1 mg  1 mg Oral Daily Bonnielee Haff, MD   1 mg at 05/18/21 1003   furosemide (LASIX) injection 40 mg  40 mg Intravenous Q12H Bonnielee Haff, MD   40 mg at 05/18/21 0616   gabapentin (NEURONTIN) capsule 300 mg  300 mg Oral BID Bonnielee Haff, MD   300 mg at 05/18/21 1003   insulin aspart (novoLOG) injection 0-15 Units  0-15 Units Subcutaneous TID WC Bonnielee Haff, MD   3 Units at 05/18/21 1243   insulin aspart (novoLOG) injection 0-5 Units  0-5 Units Subcutaneous QHS Bonnielee Haff, MD       magnesium hydroxide (MILK OF MAGNESIA) suspension 30 mL  30 mL Oral Daily PRN Bonnielee Haff, MD   30 mL at 05/17/21 1110   melatonin tablet 3 mg  3 mg Oral QHS PRN Etta Quill, DO   3 mg at 05/17/21 2152   mometasone-formoterol (DULERA) 100-5 MCG/ACT inhaler 2 puff  2 puff Inhalation BID Bonnielee Haff, MD   2 puff at 05/18/21 0700   multivitamin with minerals tablet 1 tablet  1 tablet Oral Daily Bonnielee Haff, MD   1 tablet at 05/18/21 1003   ondansetron (ZOFRAN) injection 4 mg  4 mg Intravenous Q6H PRN Bonnielee Haff, MD   4 mg at 05/15/21 1217   pantoprazole (PROTONIX) EC tablet 40 mg  40 mg Oral Daily Bonnielee Haff, MD   40 mg at 05/18/21 1003   predniSONE (DELTASONE) tablet 40 mg  40 mg Oral BID WC Bonnielee Haff, MD   40 mg at 05/18/21 0616   sodium chloride flush (NS) 0.9 % injection 3 mL  3 mL Intravenous Q12H Bonnielee Haff, MD   3 mL at 05/18/21 1004   sodium chloride flush (NS) 0.9 % injection 3 mL  3 mL Intravenous PRN Bonnielee Haff, MD       thiamine tablet 100 mg  100 mg Oral Daily Bonnielee Haff, MD   100 mg at 05/18/21 1003   Or   thiamine (B-1) injection 100 mg  100 mg Intravenous Daily  Bonnielee Haff, MD         Discharge Medications: Please see discharge summary for a list of discharge medications.  Relevant Imaging Results:  Relevant Lab Results:   Additional Information SSN: [id number]  Reece Agar, LCSWA

## 2021-05-18 NOTE — Plan of Care (Signed)

## 2021-05-18 NOTE — Progress Notes (Signed)
Mobility Specialist Progress Note    05/18/21 1535  Mobility  Activity Refused mobility   Pt stated he was in too much pain.  [facility] Mobility Specialist  M.S. Primary Phone: [phone] M.S. Secondary Phone: [phone]

## 2021-05-18 NOTE — Progress Notes (Signed)
Heart Failure Navigator Progress Note  Assessed for Heart & Vascular TOC clinic readiness.  Patient does not meet criteria due to pt follows with the [facility] in [place], has [place] that is suppose to complete this Friday. Pt shared service stories from his time during the [place] war and was very tearful--pt appreciated the listening ear. PT entered room to work with patient.   Navigator available for reassessment of patient.   Pricilla Holm, MSN, RN Heart Failure Nurse Navigator [phone]

## 2021-05-18 NOTE — TOC Initial Note (Signed)
Transition of Care [facility]) - Initial/Assessment Note    Patient Details  Name: Francis Cardenas MRN: [id number] Date of Birth: 06/01/1950  Transition of Care [facility] CM/SW Contact:    Tresa Endo Phone Number: 05/18/2021, 4:50 PM  Clinical Narrative:                 CSW received SNF consult. CSW met with pt, CSW introduced self and explained role at the hospital. Pt reports that PTA the pt is from home. PT reports pt used a 4 wheeled rollator and requires modA. PT reports a click score of 9.   CSW reviewed PT/OT recommendations for SNF. Pt reports wanting to go to a SNF using his VA benefits. Pt gave CSW permission to fax out to facilities in the area. Pt has no preference of facility at this time. CSW gave pt medicare.gov rating list to review.   CSW will continue to follow.    Expected Discharge Plan: Skilled Nursing Facility Barriers to Discharge: Continued Medical Work up   Patient Goals and CMS Choice Patient states their goals for this hospitalization and ongoing recovery are:: Rehab CMS Medicare.gov Compare Post Acute Care list provided to:: Patient Choice offered to / list presented to : Patient  Expected Discharge Plan and Services Expected Discharge Plan: [place] In-house Referral: Clinical Social Work   Post Acute Care Choice: [place] Living arrangements for the past 2 months: [place]                                      Prior Living Arrangements/Services Living arrangements for the past 2 months: Single Family Home Lives with:: Self, Spouse Patient language and need for interpreter reviewed:: Yes Do you feel safe going back to the place where you live?: Yes      Need for Family Participation in Patient Care: Yes (Comment) Care giver support system in place?: Yes (comment)   Criminal Activity/Legal Involvement Pertinent to Current Situation/Hospitalization: No - Comment as needed  Activities of Daily  Living Home Assistive Devices/Equipment: Blood pressure cuff, CPAP, Scales, Shower chair with back, Grab bars in shower ADL Screening (condition at time of admission) Patient's cognitive ability adequate to safely complete daily activities?: Yes Is the patient deaf or have difficulty hearing?: No Does the patient have difficulty seeing, even when wearing glasses/contacts?: No Does the patient have difficulty concentrating, remembering, or making decisions?: No Patient able to express need for assistance with ADLs?: Yes Does the patient have difficulty dressing or bathing?: No Independently performs ADLs?: No Communication: Needs assistance Is this a change from baseline?: Pre-admission baseline Dressing (OT): Needs assistance Is this a change from baseline?: Pre-admission baseline Grooming: Needs assistance Is this a change from baseline?: Pre-admission baseline Feeding: Independent Bathing: Needs assistance Is this a change from baseline?: Pre-admission baseline Toileting: Needs assistance Is this a change from baseline?: Pre-admission baseline In/Out Bed: Needs assistance Is this a change from baseline?: Pre-admission baseline Walks in Home: Dependent Is this a change from baseline?: Pre-admission baseline Does the patient have difficulty walking or climbing stairs?: Yes Weakness of Legs: Both Weakness of Arms/Hands: None  Permission Sought/Granted Permission sought to share information with : Family Supports, Customer service manager Permission granted to share information with : Yes, Verbal Permission Granted  Share Information with NAME: Danzig, Macgregor (Spouse)   [phone]  Permission granted to share info w AGENCY:  SNF  Permission granted to share info w Relationship: Ardell, Makarewicz (Spouse)   [phone]  Permission granted to share info w Contact Information: Venson, Ferencz (Spouse)   [phone]  Emotional Assessment Appearance:: Appears stated  age Attitude/Demeanor/Rapport: Engaged Affect (typically observed): Accepting, Appropriate Orientation: : Oriented to Self, Oriented to Place, Oriented to  Time, Oriented to Situation Alcohol / Substance Use: Not Applicable Psych Involvement: No (comment)  Admission diagnosis:  CHF (congestive heart failure) (HCC) [I50.9] SOB (shortness of breath) [R06.02] Acute on chronic systolic CHF (congestive heart failure) (HCC) [I50.23] Atrial fibrillation, unspecified type (HCC) [I48.91] Congestive heart failure, unspecified HF chronicity, unspecified heart failure type (HCC) [F07.2] Acute diastolic (congestive) heart failure (HCC) [I50.31] Patient Active Problem List   Diagnosis Date Noted   Pressure injury of skin 05/15/2021   Prostate cancer ([place]) 25/75/0518   Acute diastolic CHF (congestive heart failure) ([place]) 05/14/2021   Unspecified atrial fibrillation ([place]) 33/58/2518   Acute diastolic (congestive) heart failure ([place]) 05/14/2021   Hypertensive urgency 04/06/2019   Elevated troponin 04/06/2019   Community acquired pneumonia    COPD with acute exacerbation ([place]) 12/12/2018   Morbid obesity ([place]) 12/12/2018   Chronic diastolic CHF (congestive heart failure) ([place]) 12/10/2018   Fall 12/10/2018   Multiple rib fractures 12/10/2018   OSA on CPAP 12/10/2018   Hypokalemia 12/10/2018   Class 2 severe obesity due to excess calories with serious comorbidity and body mass index (BMI) of 37.0 to 37.9 in adult [facility])    CHF (congestive heart failure) ([place]) 01/28/2018   Acute CHF (congestive heart failure) ([place]) 12/31/2017   Acute respiratory failure with hypoxia (HCC) 12/30/2017   Chest pain syndrome 12/30/2017   Carcinoma of prostate ([place]) 08/02/2016   COPD (chronic obstructive pulmonary disease) ([place]) 08/02/2016   GERD (gastroesophageal reflux disease) 08/02/2016   Hyperlipidemia 08/02/2016   Peripheral neuropathy 08/02/2016   Steatosis of liver 08/02/2016   Diabetes mellitus type 2 in obese  ([place]) 08/02/2016   Benign essential hypertension 06/11/2013   Alcohol dependence ([place]) 03/26/2013   PTSD (post-traumatic stress disorder) 03/26/2013   Alcohol abuse 03/24/2013   Depressive disorder 03/24/2013   PCP:  Gerome Sam, MD Pharmacy:   [facility], [place] [address] [place] [address] Phone: [phone] Fax: [phone]  [facility] [place], [place] - [address] [place] [address] Phone: [phone] Fax: [phone]     Social Determinants of Health (SDOH) Interventions    Readmission Risk Interventions Readmission Risk Prevention Plan 03/21/2019 03/21/2019  Transportation Screening - Complete  PCP or Specialist Appt within 3-5 Days - (No Data)  HRI or [place] - Complete  Social Work Consult for Recovery Care Planning/Counseling - Patient refused  Palliative Care Screening - Not Applicable  Medication Review (RN Care Manager) Referral to Pharmacy Complete  Some recent data might be hidden

## 2021-05-18 NOTE — Discharge Instructions (Signed)

## 2021-05-18 NOTE — Progress Notes (Signed)
Physical Therapy Treatment Patient Details Name: Francis Cardenas MRN: [id number] DOB: [birth year] Today's Date: 05/18/2021   History of Present Illness Pt is a 70 y.o. male admitted 05/13/21 with weight gain, SOB; workup for CHF exacerbation. Course complicated by fever and pain in multiple joints, suspect acute gout. PMH includes HTN, CHF, OSA on CPAP, obesity.   PT Comments    Pt with significant change in functional mobility compared to initial PT Evaluation. Today, pt requiring heavy modA+2 to stand, unable to tolerate weight shifting or taking steps; pt limited by pain (suspect gout-related), generalized weakness, decreased activity tolerance. Pt motivated to participate and agrees on need for post-acute rehab at SNF prior to d/c recommendations (treatment team notified). Will continue to follow acutely to address established goals.    Recommendations for follow up therapy are one component of a multi-disciplinary discharge planning process, led by the attending physician.  Recommendations may be updated based on patient status, additional functional criteria and insurance authorization.  Follow Up Recommendations  Skilled nursing-short term rehab (<3 hours/day)     Assistance Recommended at Discharge Intermittent Supervision/Assistance  Equipment Recommendations   (defer to next venue)    Recommendations for Other Services       Precautions / Restrictions Precautions Precautions: Fall Restrictions Weight Bearing Restrictions: No     Mobility  Bed Mobility Overal bed mobility: Needs Assistance Bed Mobility: Supine to Sit;Sit to Supine     Supine to sit: Mod assist;HOB elevated Sit to supine: Mod assist   General bed mobility comments: ModA for HHA to elevate trunk, significant increased time and effort to scoot hips to EOB with cues for sequencing; modA for BLE management with return to supine    Transfers Overall transfer level: Needs assistance Equipment used:  Rollator (4 wheels) Transfers: Sit to/from Stand Sit to Stand: Mod assist;+2 physical assistance;+2 safety/equipment;From elevated surface           General transfer comment: ModA+2 for trunk elevation standing from raised EOB to rollator, pt with difficulty maintaining LUE grip on rollator due to pain requiring max cues for safety and sequencing, difficulty achieving fully upright posture due to c/o pain; unable to shift weight in order to take complete steps despite assist to maintain upright    Ambulation/Gait               General Gait Details: unable this session   Stairs             Wheelchair Mobility    Modified Rankin (Stroke Patients Only)       Balance Overall balance assessment: Needs assistance Sitting-balance support: Feet supported Sitting balance-Leahy Scale: Fair Sitting balance - Comments: requires assist to don shoes   Standing balance support: Reliant on assistive device for balance;Bilateral upper extremity supported Standing balance-Leahy Scale: Poor Standing balance comment: reliant on BUE support and external assist to maintain static standing                            Cognition Arousal/Alertness: Awake/alert Behavior During Therapy: WFL for tasks assessed/performed Overall Cognitive Status: Within Functional Limits for tasks assessed                                 General Comments: Verbose with speech requiring frequent redirection to task; pt demonstrate some insight into deficits and need for post-acute rehab before d/c home  Exercises      General Comments General comments (skin integrity, edema, etc.): discussed updating d/c recommendations from [facility] to SNF since pt requiring significantly increased assist - pt in agreement (treatment team notified)      Pertinent Vitals/Pain Pain Assessment: Faces Faces Pain Scale: Hurts even more Pain Location: multiple joints - especially L elbow/wrist,  bilateral feet/ankles Pain Descriptors / Indicators: Discomfort;Grimacing;Guarding Pain Intervention(s): Monitored during session;Limited activity within patient's tolerance    Home Living                          Prior Function            PT Goals (current goals can now be found in the care plan section) Acute Rehab PT Goals Patient Stated Goal: Agreeable to post-acute rehab at SNF before return home Progress towards PT goals: Not progressing toward goals - comment (worsened gout pain)    Frequency    Min 2X/week      PT Plan Discharge plan needs to be updated;Frequency needs to be updated    Co-evaluation PT/OT/SLP Co-Evaluation/Treatment:  (dovetail turned co-tx due to need for increased assist)            AM-PAC PT "6 Clicks" Mobility   Outcome Measure  Help needed turning from your back to your side while in a flat bed without using bedrails?: A Lot Help needed moving from lying on your back to sitting on the side of a flat bed without using bedrails?: A Lot Help needed moving to and from a bed to a chair (including a wheelchair)?: A Lot Help needed standing up from a chair using your arms (e.g., wheelchair or bedside chair)?: Total Help needed to walk in hospital room?: Total Help needed climbing 3-5 steps with a railing? : Total 6 Click Score: 9    End of Session Equipment Utilized During Treatment: Gait belt Activity Tolerance: Patient tolerated treatment well;Patient limited by pain Patient left: in bed;with call bell/phone within reach;with bed alarm set Nurse Communication: Mobility status PT Visit Diagnosis: Unsteadiness on feet (R26.81);Other abnormalities of gait and mobility (R26.89);Muscle weakness (generalized) (M62.81);Pain     Time: 0922-0952 PT Time Calculation (min) (ACUTE ONLY): 30 min  Charges:  $Therapeutic Activity: 8-22 mins                     Mabeline Caras, PT, DPT Acute Rehabilitation Services  Pager  [phone] Office [place] 05/18/2021, 10:38 AM

## 2021-05-18 NOTE — Progress Notes (Signed)
[facility] PROGRESS NOTE   Francis Cardenas FBP:[id number] DOB: 1951/03/22 DOA: 05/14/2021  PCP: Gerome Sam, MD  Brief History/Interval Summary: 70 y.o. male with a past medical history of chronic diastolic CHF, essential hypertension, morbid obesity, obstructive sleep apnea on CPAP who was in his usual state of health till about 2 months ago when he started noticing that he was gaining weight.  He gained about 30 pounds so.  Started developing shortness of breath and decided to come to the hospital.  He was found to be fluid overloaded.  He was also recently diagnosed with atrial fibrillation by his primary care provider but has not been placed on anticoagulation yet.  Hospitalize for further management.   Placed on IV diuretics and IV heparin.  Transition to apixaban today.  IV diuretics for 1 or 2 more days.  Reason for Visit: Acute diastolic CHF  Consultants: None  Procedures: None    Subjective/Interval History: Patient mentions that the joint pains appear to be getting better.  Denies any shortness of breath or chest pain.  No nausea vomiting.     Assessment/Plan:  Acute on chronic diastolic CHF Presented with a history of weight gain of 25 to 30 pounds in the last 2 months.  This is despite taking torsemide on a daily basis. He had an echocardiogram on December 16 at the [place] [facility]. This revealed ejection fraction of greater than 55%.  Right ventricle was normal in size and function.  RV systolic pressure was 82-42 suggesting mild to moderate pulmonary hypertension.  Diastolic function was not well evaluated due to arrhythmia.  Left atrium was noted to be mildly dilated.  No pericardial effusion was noted. Weight seems to be improving although weight measurements are all over the place. Continue IV diuretics for now.  Continue carvedilol.   ACE inhibitor is on hold for now as his creatinine was initially elevated.  We will readdress ACE inhibitor in the next few  days. EKG did not show any ischemic changes.  Troponins were normal.  Atrial fibrillation, unspecified This was diagnosed about 2 weeks prior to this admission by his primary care provider.  He has a Pharmacist, community.  TSH normal at 2.3.  CHADS2 vascular score is 2.   Patient was placed on IV heparin.  Renal function is stable.  Transition to apixaban today.  Follow-up with cardiology at [facility] after discharge.  Fever thought to be secondary to acute gout No infectious etiology was found.  UA was unremarkable.  Chest x-ray actually showed improvement.  No skin rashes.  Sacral decubitus did not look infected. COVID-19 and influenza PCR were repeated and were negative. He did mention polyarthralgia.  Uric acid noted to be significantly elevated at 11.8.  Procalcitonin was only 0.15.   Fever most likely due to acute gout triggered by diuretics.  Started on Solu-Medrol and colchicine yesterday.  Patient has noticed some improvement in his joint pain today.  Continue to monitor.  Fever appears to have subsided Venous Doppler studies were negative for DVT.  Chronic kidney disease stage IIIa Creatinine was 1.57 at the time of admission.  Renal function is stable. Holding his ACE inhibitor and spironolactone   Hyponatremia secondary to hypervolemia Sodium level had improved.  Noted to be low again at 129 today.  We will recheck tomorrow.    Essential hypertension Blood pressure is reasonably well controlled.  History of COPD Stable.  Continue nebulizer treatments as needed.  Saturating normal on room air.  No  wheezing appreciated.  Diabetes mellitus type 2 in obese Apparently this is previous history.  He has not taken metformin in at least a year or so.  HbA1c 5.5 which is consistent with his history.   Elevated glucose level noted today.  Most likely due to steroids.  Will initiate SSI and check CBGs.  Alcohol abuse Drinks 8 cans of beers on a daily basis.  Continue CIWA protocol.   Continue thiamine multivitamins.  Has not had any withdrawal symptoms.  Normocytic anemia No evidence of overt blood loss.  Folic acid 16.1, vitamin B12 470.  Ferritin 79, iron 26, TIBC 423% saturation 6.   Could be suggestive of iron deficiency.  Benefit from iron supplementation at discharge.  Further work-up can be pursued in the outpatient setting.  Obstructive sleep apnea Continue CPAP  Morbid obesity Estimated body mass index is 41.22 kg/m as calculated from the following:   Height as of this encounter: 5\' 10"  (1.778 m).   Weight as of this encounter: 130.3 kg.  Decubitus buttocks stage II Pressure Injury 05/15/21 Buttocks Left Stage 2 -  Partial thickness loss of dermis presenting as a shallow open injury with a red, pink wound bed without slough. bruising around the site vs. deep tissue injury (Active)  05/15/21 0300  Location: Buttocks  Location Orientation: Left  Staging: Stage 2 -  Partial thickness loss of dermis presenting as a shallow open injury with a red, pink wound bed without slough.  Wound Description (Comments): bruising around the site vs. deep tissue injury  Present on Admission: Yes (Per the Pt, he has had the sore for a while)    DVT Prophylaxis: IV heparin to be changed over to apixaban. Code Status: Full code Family Communication: Discussed with the patient Disposition Plan: PT and OT to reevaluate.  Has limited mobility at home.  Uses a walker when at home.  Uses a wheelchair when out.    Status is: Inpatient  Remains inpatient appropriate because: Acute CHF requiring IV diuretics      Medications: Scheduled:  apixaban  5 mg Oral BID   aspirin EC  81 mg Oral Q breakfast   atorvastatin  40 mg Oral Daily   carvedilol  12.5 mg Oral BID WC   colchicine  0.6 mg Oral Daily   folic acid  1 mg Oral Daily   furosemide  40 mg Intravenous Q12H   gabapentin  300 mg Oral BID   mometasone-formoterol  2 puff Inhalation BID   multivitamin with minerals  1  tablet Oral Daily   pantoprazole  40 mg Oral Daily   predniSONE  40 mg Oral BID WC   sodium chloride flush  3 mL Intravenous Q12H   thiamine  100 mg Oral Daily   Or   thiamine  100 mg Intravenous Daily   Continuous:  sodium chloride     WRU:EAVWUJ chloride, acetaminophen, ALPRAZolam, magnesium hydroxide, melatonin, ondansetron (ZOFRAN) IV, sodium chloride flush  Antibiotics: Anti-infectives (From admission, onward)    None       Objective:  Vital Signs  Vitals:   05/18/21 0019 05/18/21 0452 05/18/21 0700 05/18/21 0811  BP: (!) 127/54 (!) 120/56  (!) 129/58  Pulse: (!) 50 (!) 48  60  Resp: 18 14  20   Temp: 98.7 F (37.1 C) 98.1 F (36.7 C)  98.6 F (37 C)  TempSrc: Axillary Oral  Oral  SpO2: 96% 98% 96% 94%  Weight:      Height:  Intake/Output Summary (Last 24 hours) at 05/18/2021 0920 Last data filed at 05/18/2021 0800 Gross per 24 hour  Intake 673.16 ml  Output 800 ml  Net -126.84 ml    Filed Weights   05/15/21 0500 05/16/21 0100 05/17/21 0428  Weight: 133.4 kg (!) 137.2 kg 130.3 kg    General appearance: Awake alert.  In no distress Resp: Normal effort.  Coarse breath sounds bilaterally.  No wheezing or rhonchi. Cardio: S1-S2 is irregularly irregular GI: Abdomen is soft.  Nontender nondistended.  Bowel sounds are present normal.  No masses organomegaly Extremities: Improved edema bilateral lower extremities.  Improvement in range of motion of the right ankle, left ankle, left elbow and left wrist.  No erythema. Neurologic: Alert and oriented x3.  No focal neurological deficits.      Lab Results:  Data Reviewed: I have personally reviewed following labs and imaging studies  CBC: Recent Labs  Lab 05/14/21 0619 05/15/21 0329 05/16/21 0030 05/17/21 0145 05/18/21 0043  WBC 8.3 7.9 8.3 11.6* 10.0  HGB 10.3* 10.2* 9.5* 9.5* 9.4*  HCT 31.6* 31.4* 29.9* 29.1* 28.1*  MCV 93.8 94.6 96.8 95.1 93.7  PLT 191 202 181 174 184     Basic  Metabolic Panel: Recent Labs  Lab 05/14/21 0619 05/15/21 0329 05/16/21 0030 05/17/21 0145 05/18/21 0043  NA 129* 134* 134* 134* 129*  K 5.2* 4.1 4.2 3.9 4.3  CL 98 100 100 100 95*  CO2 24 26 25 24 26   GLUCOSE 119* 125* 124* 126* 210*  BUN 31* 18 20 22  26*  CREATININE 1.57* 1.20 1.39* 1.36* 1.38*  CALCIUM 10.4* 10.4* 9.9 9.9 10.1  MG 2.0  --   --   --   --      GFR: Estimated Creatinine Clearance: 65.6 mL/min (A) (by C-G formula based on SCr of 1.38 mg/dL (H)).    Recent Results (from the past 240 hour(s))  Resp Panel by RT-PCR (Flu A&B, Covid) Nasopharyngeal Swab     Status: None   Collection Time: 05/14/21  1:46 PM   Specimen: Nasopharyngeal Swab; Nasopharyngeal(NP) swabs in vial transport medium  Result Value Ref Range Status   SARS Coronavirus 2 by RT PCR NEGATIVE NEGATIVE Final    Comment: (NOTE) SARS-CoV-2 target nucleic acids are NOT DETECTED.  The SARS-CoV-2 RNA is generally detectable in upper respiratory specimens during the acute phase of infection. The lowest concentration of SARS-CoV-2 viral copies this assay can detect is 138 copies/mL. A negative result does not preclude SARS-Cov-2 infection and should not be used as the sole basis for treatment or other patient management decisions. A negative result may occur with  improper specimen collection/handling, submission of specimen other than nasopharyngeal swab, presence of viral mutation(s) within the areas targeted by this assay, and inadequate number of viral copies(<138 copies/mL). A negative result must be combined with clinical observations, patient history, and epidemiological information. The expected result is Negative.  Fact Sheet for Patients:  EntrepreneurPulse.com.au  Fact Sheet for Healthcare Providers:  IncredibleEmployment.be  This test is no t yet approved or cleared by the [place] FDA and  has been authorized for detection and/or diagnosis of  SARS-CoV-2 by FDA under an Emergency Use Authorization (EUA). This EUA will remain  in effect (meaning this test can be used) for the duration of the COVID-19 declaration under Section 564(b)(1) of the Act, 21 U.S.C.section 360bbb-3(b)(1), unless the authorization is terminated  or revoked sooner.       Influenza A by PCR NEGATIVE  NEGATIVE Final   Influenza B by PCR NEGATIVE NEGATIVE Final    Comment: (NOTE) The Xpert Xpress SARS-CoV-2/FLU/RSV plus assay is intended as an aid in the diagnosis of influenza from Nasopharyngeal swab specimens and should not be used as a sole basis for treatment. Nasal washings and aspirates are unacceptable for Xpert Xpress SARS-CoV-2/FLU/RSV testing.  Fact Sheet for Patients: EntrepreneurPulse.com.au  Fact Sheet for Healthcare Providers: IncredibleEmployment.be  This test is not yet approved or cleared by the [place] FDA and has been authorized for detection and/or diagnosis of SARS-CoV-2 by FDA under an Emergency Use Authorization (EUA). This EUA will remain in effect (meaning this test can be used) for the duration of the COVID-19 declaration under Section 564(b)(1) of the Act, 21 U.S.C. section 360bbb-3(b)(1), unless the authorization is terminated or revoked.  Performed at [facility], [address] [place], [place] [address]   MRSA Next Gen by PCR, Nasal     Status: None   Collection Time: 05/14/21  4:50 PM   Specimen: Nasal Mucosa; Nasal Swab  Result Value Ref Range Status   MRSA by PCR Next Gen NOT DETECTED NOT DETECTED Final    Comment: (NOTE) The GeneXpert MRSA Assay (FDA approved for NASAL specimens only), is one component of a comprehensive MRSA colonization surveillance program. It is not intended to diagnose MRSA infection nor to guide or monitor treatment for MRSA infections. Test performance is not FDA approved in patients less than 61 years old. Performed at  [facility], [place] [address] [place], [place] [address]   Culture, blood (routine x 2)     Status: None (Preliminary result)   Collection Time: 05/15/21  8:45 PM   Specimen: BLOOD RIGHT HAND  Result Value Ref Range Status   Specimen Description BLOOD RIGHT HAND  Final   Special Requests AEROBIC BOTTLE ONLY Blood Culture adequate volume  Final   Culture   Final    NO GROWTH 3 DAYS Performed at [facility], [place] [address], [place], [place] [address]    Report Status PENDING  Incomplete  Culture, blood (routine x 2)     Status: None (Preliminary result)   Collection Time: 05/15/21  8:45 PM   Specimen: BLOOD RIGHT HAND  Result Value Ref Range Status   Specimen Description BLOOD RIGHT HAND  Final   Special Requests AEROBIC BOTTLE ONLY Blood Culture adequate volume  Final   Culture   Final    NO GROWTH 3 DAYS Performed at [facility], [place] [address] [place], [place] [address]    Report Status PENDING  Incomplete  Urine Culture     Status: Abnormal (Preliminary result)   Collection Time: 05/15/21 10:04 PM   Specimen: Urine, Clean Catch  Result Value Ref Range Status   Specimen Description URINE, CLEAN CATCH  Final   Special Requests NONE  Final   Culture (A)  Final    >=100,000 COLONIES/mL ENTEROCOCCUS FAECALIS 80,000 COLONIES/mL STAPHYLOCOCCUS HAEMOLYTICUS CULTURE REINCUBATED FOR BETTER GROWTH Performed at [facility], [place] [address] [place], [place] [address]    Report Status PENDING  Incomplete  Resp Panel by RT-PCR (Flu A&B, Covid) Nasopharyngeal Swab     Status: None   Collection Time: 05/16/21  8:00 PM   Specimen: Nasopharyngeal Swab; Nasopharyngeal(NP) swabs in vial transport medium  Result Value Ref Range Status   SARS Coronavirus 2 by RT PCR NEGATIVE NEGATIVE Final    Comment: (NOTE) SARS-CoV-2 target nucleic acids are  NOT DETECTED.  The SARS-CoV-2 RNA is generally detectable in upper respiratory specimens during the acute phase of  infection. The lowest concentration of SARS-CoV-2 viral copies this assay can detect is 138 copies/mL. A negative result does not preclude SARS-Cov-2 infection and should not be used as the sole basis for treatment or other patient management decisions. A negative result may occur with  improper specimen collection/handling, submission of specimen other than nasopharyngeal swab, presence of viral mutation(s) within the areas targeted by this assay, and inadequate number of viral copies(<138 copies/mL). A negative result must be combined with clinical observations, patient history, and epidemiological information. The expected result is Negative.  Fact Sheet for Patients:  EntrepreneurPulse.com.au  Fact Sheet for Healthcare Providers:  IncredibleEmployment.be  This test is no t yet approved or cleared by the [place] FDA and  has been authorized for detection and/or diagnosis of SARS-CoV-2 by FDA under an Emergency Use Authorization (EUA). This EUA will remain  in effect (meaning this test can be used) for the duration of the COVID-19 declaration under Section 564(b)(1) of the Act, 21 U.S.C.section 360bbb-3(b)(1), unless the authorization is terminated  or revoked sooner.       Influenza A by PCR NEGATIVE NEGATIVE Final   Influenza B by PCR NEGATIVE NEGATIVE Final    Comment: (NOTE) The Xpert Xpress SARS-CoV-2/FLU/RSV plus assay is intended as an aid in the diagnosis of influenza from Nasopharyngeal swab specimens and should not be used as a sole basis for treatment. Nasal washings and aspirates are unacceptable for Xpert Xpress SARS-CoV-2/FLU/RSV testing.  Fact Sheet for Patients: EntrepreneurPulse.com.au  Fact Sheet for Healthcare Providers: IncredibleEmployment.be  This test is not yet approved or cleared by the [place] FDA and has been authorized for detection and/or diagnosis of SARS-CoV-2  by FDA under an Emergency Use Authorization (EUA). This EUA will remain in effect (meaning this test can be used) for the duration of the COVID-19 declaration under Section 564(b)(1) of the Act, 21 U.S.C. section 360bbb-3(b)(1), unless the authorization is terminated or revoked.  Performed at [facility], [place] [address] [place], [place] [address]        Radiology Studies: VAS [place] LOWER EXTREMITY VENOUS (DVT)  Result Date: 05/17/2021  Lower Venous DVT Study Patient Name:  Francis Cardenas  Date of Exam:   05/17/2021 Medical Rec #: [id number]       Accession #:    [id number] Date of Birth: 11/17/1950      Patient Gender: M Patient Age:   16 years Exam Location:  [facility] Procedure:      VAS [place] LOWER EXTREMITY VENOUS (DVT) Referring Phys: Bonnielee Haff --------------------------------------------------------------------------------  Indications: Swelling, and Edema.  Comparison Study: no prior Performing Technologist: Archie Patten RVS  Examination Guidelines: A complete evaluation includes B-mode imaging, spectral Doppler, color Doppler, and power Doppler as needed of all accessible portions of each vessel. Bilateral testing is considered an integral part of a complete examination. Limited examinations for reoccurring indications may be performed as noted. The reflux portion of the exam is performed with the patient in reverse Trendelenburg.  +---------+---------------+---------+-----------+----------+--------------+  RIGHT     Compressibility Phasicity Spontaneity Properties Thrombus Aging  +---------+---------------+---------+-----------+----------+--------------+  CFV       Full            Yes       Yes                                    +---------+---------------+---------+-----------+----------+--------------+  SFJ       Full                                                             +---------+---------------+---------+-----------+----------+--------------+  FV Prox   Full                                                              +---------+---------------+---------+-----------+----------+--------------+  FV Mid    Full                                                             +---------+---------------+---------+-----------+----------+--------------+  FV Distal Full                                                             +---------+---------------+---------+-----------+----------+--------------+  PFV       Full                                                             +---------+---------------+---------+-----------+----------+--------------+  POP       Full            Yes       Yes                                    +---------+---------------+---------+-----------+----------+--------------+  PTV       Full                                                             +---------+---------------+---------+-----------+----------+--------------+  PERO      Full                                                             +---------+---------------+---------+-----------+----------+--------------+   +---------+---------------+---------+-----------+----------+--------------+  LEFT      Compressibility Phasicity Spontaneity Properties Thrombus Aging  +---------+---------------+---------+-----------+----------+--------------+  CFV       Full            Yes       Yes                                    +---------+---------------+---------+-----------+----------+--------------+  SFJ       Full                                                             +---------+---------------+---------+-----------+----------+--------------+  FV Prox   Full                                                             +---------+---------------+---------+-----------+----------+--------------+  FV Mid    Full                                                             +---------+---------------+---------+-----------+----------+--------------+  FV Distal Full                                                              +---------+---------------+---------+-----------+----------+--------------+  PFV       Full                                                             +---------+---------------+---------+-----------+----------+--------------+  POP       Full            Yes       Yes                                    +---------+---------------+---------+-----------+----------+--------------+  PTV       Full                                                             +---------+---------------+---------+-----------+----------+--------------+  PERO      Full                                                             +---------+---------------+---------+-----------+----------+--------------+     Summary: BILATERAL: - No evidence of deep vein thrombosis seen in the lower extremities, bilaterally. - RIGHT: - No cystic structure found in the popliteal fossa.  LEFT: - A cystic structure is found in the popliteal fossa.  *See table(s) above for measurements and observations. Electronically signed by Jamelle Haring on 05/17/2021  at 1:06:29 PM.    Final        LOS: 4 days   [facility] Pager on [email or web address]  05/18/2021, 9:20 AM

## 2021-05-18 NOTE — TOC Benefit Eligibility Note (Signed)
Patient Advocate Encounter  Insurance verification completed.    The patient is currently admitted and upon discharge could be taking Eliquis 5 mg.  The current 30 day co-pay is, $45.00.   The patient is insured through [employer] Part D     Vitoria Conyer, CPhT Pharmacy Patient Advocate Specialist [facility] Patient Advocate Team Direct Number: [phone]  Fax: [phone]        

## 2021-05-18 NOTE — Progress Notes (Signed)
ANTICOAGULATION CONSULT NOTE - Follow Up Consult  Pharmacy Consult for heparin Indication: atrial fibrillation  Labs: Recent Labs    05/15/21 0329 05/15/21 1301 05/16/21 0030 05/17/21 0145 05/17/21 1147 05/17/21 2018  HGB 10.2*  --  9.5* 9.5*  --   --   HCT 31.4*  --  29.9* 29.1*  --   --   PLT 202  --  181 174  --   --   HEPARINUNFRC 0.21*   < > 0.40 0.27* 0.34 0.35  CREATININE 1.20  --  1.39* 1.36*  --   --    < > = values in this interval not displayed.    Assessment/Plan:  70yo male remains therapeutic on heparin; of note RN states that IV was found to be out at ~0100, last known to be running at 2200, so next lab may be affected as it's unclear how long the heparin was disconnected. Will continue infusion at current rate of 1900 units/hr and monitor daily level.     Wynona Neat, PharmD, BCPS  05/18/2021,12:58 AM

## 2021-05-18 NOTE — Progress Notes (Signed)
Patient took inhaler on his own at 7am

## 2021-05-18 NOTE — Progress Notes (Signed)
ANTICOAGULATION CONSULT NOTE   Pharmacy Consult for heparin Indication: atrial fibrillation  Allergies  Allergen Reactions   Oxcarbazepine Other (See Comments)    Dizziness/ lightheaded   Zolpidem Nausea Only    Patient Measurements: Height: 5\' 10"  (177.8 cm) Weight: 130.3 kg (287 lb 4.2 oz) IBW/kg (Calculated) : 73 Heparin Dosing Weight: 107.6 kg  Vital Signs: Temp: 98.6 F (37 C) (12/27 0811) Temp Source: Oral (12/27 0811) BP: 129/58 (12/27 0811) Pulse Rate: 60 (12/27 0811)  Labs: Recent Labs    05/16/21 0030 05/17/21 0145 05/17/21 1147 05/17/21 2018 05/18/21 0043  HGB 9.5* 9.5*  --   --  9.4*  HCT 29.9* 29.1*  --   --  28.1*  PLT 181 174  --   --  184  HEPARINUNFRC 0.40 0.27* 0.34 0.35 <0.10*  CREATININE 1.39* 1.36*  --   --  1.38*     Estimated Creatinine Clearance: 65.6 mL/min (A) (by C-G formula based on SCr of 1.38 mg/dL (H)).   Assessment: 30 yom with new Afib presenting in CHF exacerbation. No listed AC PTA. Pharmacy dosing heparin.  Pt to transition to apixaban. Age 70, Wt 130kg, Cr 1.38.  Goal of Therapy:  Heparin level 0.3-0.7 units/ml Monitor platelets by anticoagulation protocol: Yes   Plan:  Stop heparin Apixaban 5mg  BID  Arrie Senate, PharmD, BCPS, [facility] Clinical Pharmacist [phone] Please check AMION for all [facility] numbers 05/18/2021

## 2021-05-19 LAB — URINE CULTURE: Culture: >=100000 — AB

## 2021-05-19 LAB — GLUCOSE, CAPILLARY
Glucose-Capillary: 179 mg/dL — ABNORMAL HIGH (ref 70–99)
Glucose-Capillary: 248 mg/dL — ABNORMAL HIGH (ref 70–99)
Glucose-Capillary: 257 mg/dL — ABNORMAL HIGH (ref 70–99)
Glucose-Capillary: 244 mg/dL — ABNORMAL HIGH (ref 70–99)

## 2021-05-19 LAB — BASIC METABOLIC PANEL
Anion gap: 10 (ref 5–15)
BUN: 33 mg/dL — ABNORMAL HIGH (ref 8–23)
CO2: 27 mmol/L (ref 22–32)
Calcium: 10 mg/dL (ref 8.9–10.3)
Chloride: 94 mmol/L — ABNORMAL LOW (ref 98–111)
Creatinine, Ser: 1.22 mg/dL (ref 0.61–1.24)
GFR, Estimated: >60 mL/min (ref >60)
Glucose, Bld: 222 mg/dL — ABNORMAL HIGH (ref 70–99)
Potassium: 4.2 mmol/L (ref 3.5–5.1)
Sodium: 131 mmol/L — ABNORMAL LOW (ref 135–145)

## 2021-05-19 LAB — MAGNESIUM: Magnesium: 2.4 mg/dL (ref 1.7–2.4)

## 2021-05-19 MED ORDER — AMOXICILLIN-POT CLAVULANATE 875-125 MG PO TABS
1.0000 | ORAL_TABLET | Freq: Two times a day (BID) | ORAL | Status: AC
  Administered 2021-05-19 – 2021-05-21 (×5): 1 via ORAL
  Filled 2021-05-19 (×5): qty 1

## 2021-05-19 MED ORDER — PREDNISONE 20 MG PO TABS
40.0000 mg | ORAL_TABLET | Freq: Every day | ORAL | Status: DC
  Administered 2021-05-20 – 2021-05-22 (×3): 40 mg via ORAL
  Filled 2021-05-19 (×3): qty 2

## 2021-05-19 NOTE — TOC Progression Note (Signed)
Transition of Care [facility]) - Progression Note    Patient Details  Name: Francis Cardenas MRN: [id number] Date of Birth: 03/29/1951  Transition of Care [facility]) CM/SW Contact  Reece Agar, [place] Phone Number: 05/19/2021, 4:41 PM  Clinical Narrative:    CSW spoke with [facility] representative who provided, the contact information for pt's [facility] [place] [phone] ext. [phone]), and pt's PCP is Risa Grill. Pt has been accepted at [facility] and they are willing to accept pt.   CSW contacted [facility] Al[facility], phone went to VM. CSW left a message and will follow up for Snf options.   Expected Discharge Plan: [place] Barriers to Discharge: Continued Medical Work up  Expected Discharge Plan and Services Expected Discharge Plan: [place] In-house Referral: Clinical Social Work   Post Acute Care Choice: [place] Living arrangements for the past 2 months: [place] Determinants of Health (SDOH) Interventions    Readmission Risk Interventions Readmission Risk Prevention Plan 03/21/2019 03/21/2019  Transportation Screening - Complete  PCP or Specialist Appt within 3-5 Days - (No Data)  [place] or [place] - Complete  Social Work Consult for [place] Planning/Counseling - Patient refused  Palliative Care Screening - Not Applicable  Medication Review (RN Care Manager) Referral to Pharmacy Complete  Some recent data might be hidden

## 2021-05-19 NOTE — Progress Notes (Signed)
PROGRESS NOTE  Francis Cardenas  DOB: 12-Jan-1951  PCP: Gerome Sam, MD WUJ:[id number]  DOA: 05/14/2021  LOS: 5 days  Hospital Day: 6  Chief Complaint  Patient presents with   Shortness of Breath    Brief narrative: Francis Cardenas is a 70 y.o. male with PMH significant for morbid obesity, OSA on CPAP, OSA, HTN, chronic diastolic CHF and recently diagnosed A. fib today.  Patient reports progressive weight gain about 30 pounds in the last 2 months with insidious shortness of breath for which she presented to the ED on 12/23.  He was noted to be fluid overloaded.   Patient was started on IV diuretic, IV heparin and admitted to hospitalist service.  Subjective: Patient was seen and examined this morning.  Pleasant elderly Caucasian male.  Propped up in bed.  Not in distress.  No new symptoms Heart rate in 50s and 60s.  Creatinine down to 1.32 today.  Blood glucose level 179 this morning.  Currently on Lasix 40 mg IV twice daily.  Denies any urinary symptoms.  Assessment/Plan: Acute on chronic diastolic CHF -Patient presented with 30 pound weight gain in the last 2 months despite reported compliance to diuretics.   -Echo on 12/16 at [facility] with EF 95%, RV systolic pressure 62-13 suggestive of mild to moderate pulm hypertension  -He is currently being diuresed with IV Lasix.  In last 24 hours, negative balance of 1.2 L, since admission negative balance of 8 L. -Also on Coreg.  ACE inhibitor on hold -Net IO Since Admission: -7,390.84 mL [05/19/21 1458] -Continue to monitor for daily intake output, weight, blood pressure, BNP, renal function and electrolytes. Recent Labs  Lab 05/14/21 0619 05/14/21 0639 05/15/21 0329 05/16/21 0030 05/17/21 0145 05/18/21 0043 05/19/21 0115  BNP  --  458.2*  --   --   --   --   --   BUN 31*  --  18 20 22  26* 33*  CREATININE 1.57*  --  1.20 1.39* 1.36* 1.38* 1.22  K 5.2*  --  4.1 4.2 3.9 4.3 4.2  MG 2.0  --   --   --   --   --  2.4   A.  Fib -Recently diagnosed as an outpatient by PCP.  Has Zio patch in place -CHADSVASC score 2.  Initially started on heparin drip.  Later transitioned to Eliquis. -Follow-up with cardiology at [place] after discharge.   Acute gout -Complains of fever, polyarthralgia, elevated uric acid level to 11.8.   -Started on Solu-Medrol and colchicine with improvement in joint pain and fever  -Currently on colchicine 0.6 mg daily and prednisone 40 mg twice daily oral.  I will switch it to 40 mg daily.  Enterococcus UTI -Start on Augmentin for 5 days  Chronic kidney disease stage IIIa -Creatinine was 1.57 at the time of admission.  -Renal function is stable. -ACE inhibitor and Aldactone on hold.   Hyponatremia  -Sodium level between 130 and 135. Recent Labs  Lab 05/14/21 0619 05/15/21 0329 05/16/21 0030 05/17/21 0145 05/18/21 0043 05/19/21 0115  NA 129* 134* 134* 134* 129* 131*   Essential hypertension -Blood pressure is reasonably well controlled.  History of COPD -Stable. Continue nebulizer treatments as needed.  Saturating normal on room air.  No wheezing appreciated.   Type 2 diabetes mellitus with hyperglycemia -A1c 5.5 on 05/15/2021 -Diet controlled at home.  Currently blood sugar elevated because of steroids. -Currently on sliding scale insulin with Accu-Cheks. Recent Labs  Lab 05/18/21  1222 05/18/21 1630 05/18/21 2143 05/19/21 0632 05/19/21 1212  GLUCAP 189* 223* 219* 179* 248*   Chronic alcohol abuse -Reportedly, patient was drinking 8 cans of beers on a daily basis.   -No withdrawal symptoms in the hospital.   -Continue thiamine multivitamins.     Normocytic anemia -Stable hemoglobin between 9-10 without evidence of iron, folate or vitamin B12 deficiency Recent Labs    05/14/21 0619 05/15/21 0329 05/16/21 0030 05/17/21 0145 05/18/21 0043  HGB 10.3* 10.2* 9.5* 9.5* 9.4*  MCV 93.8 94.6 96.8 95.1 93.7  VITAMINB12  --  470  --   --   --   FOLATE  --   26.5  --   --   --   FERRITIN  --  79  --   --   --   TIBC  --  423  --   --   --   IRON  --  26*  --   --   --   RETICCTPCT  --  2.9  --   --   --    Obstructive sleep apnea Continue CPAP   Morbid obesity  -Body mass index is 41.44 kg/m. Patient has been advised to make an attempt to improve diet and exercise patterns to aid in weight loss.  Mobility: Encourage ambulation Living condition: Was living at home with wife Goals of care:   Code Status: Full Code  Nutritional status: Body mass index is 41.44 kg/m.      Diet:  Diet Order             Diet heart healthy/carb modified Room service appropriate? Yes; Fluid consistency: Thin  Diet effective now                  DVT prophylaxis:   apixaban (ELIQUIS) tablet 5 mg   Antimicrobials: Augmentin Fluid: None Consultants: None Family Communication: None at bedside  Status is: Inpatient  Continue in-hospital care because: Pending SNF Level of care: Telemetry Cardiac   Dispo: The patient is from: Home              Anticipated d/c is to: SNF              Patient currently is not medically stable to d/c.   Difficult to place patient No     Infusions:   sodium chloride      Scheduled Meds:  amoxicillin-clavulanate  1 tablet Oral Q12H   apixaban  5 mg Oral BID   aspirin EC  81 mg Oral Q breakfast   atorvastatin  40 mg Oral Daily   carvedilol  12.5 mg Oral BID WC   colchicine  0.6 mg Oral Daily   folic acid  1 mg Oral Daily   furosemide  40 mg Intravenous Q12H   gabapentin  300 mg Oral BID   insulin aspart  0-15 Units Subcutaneous TID WC   insulin aspart  0-5 Units Subcutaneous QHS   mometasone-formoterol  2 puff Inhalation BID   multivitamin with minerals  1 tablet Oral Daily   pantoprazole  40 mg Oral Daily   [START ON 05/20/2021] predniSONE  40 mg Oral Q breakfast   sodium chloride flush  3 mL Intravenous Q12H   thiamine  100 mg Oral Daily   Or   thiamine  100 mg Intravenous Daily    PRN  meds: sodium chloride, acetaminophen, ALPRAZolam, magnesium hydroxide, melatonin, ondansetron (ZOFRAN) IV, sodium chloride flush   Antimicrobials: Anti-infectives (From admission, onward)  Start     Dose/Rate Route Frequency Ordered Stop   05/19/21 1545  amoxicillin-clavulanate (AUGMENTIN) 875-125 MG per tablet 1 tablet        1 tablet Oral Every 12 hours 05/19/21 1453 05/21/21 2159       Objective: Vitals:   05/19/21 0732 05/19/21 1214  BP: (!) 124/43 (!) 120/57  Pulse: 61 62  Resp: 15 12  Temp: 97.6 F (36.4 C) 97.9 F (36.6 C)  SpO2: 96% 95%    Intake/Output Summary (Last 24 hours) at 05/19/2021 1458 Last data filed at 05/19/2021 1215 Gross per 24 hour  Intake 1086 ml  Output 2125 ml  Net -1039 ml   Filed Weights   05/16/21 0100 05/17/21 0428 05/19/21 0630  Weight: (!) 137.2 kg 130.3 kg 131 kg   Weight change:  Body mass index is 41.44 kg/m.   Physical Exam: General exam: Pleasant, elderly Caucasian male.  Not in physical distress Skin: No rashes, lesions or ulcers. HEENT: Atraumatic, normocephalic, no obvious bleeding Lungs: Clear to auscultation bilaterally CVS: Regular rate and rhythm, no murmur GI/Abd soft, nontender, nondistended, bowel sound present CNS: Alert, awake, oriented x3 Psychiatry: Mood appropriate  Extremities: no pedal edema, no calf tenderness  Data Review: I have personally reviewed the laboratory data and studies available.  F/u labs ordered Unresulted Labs (From admission, onward)     Start     Ordered   05/20/21 0500  CBC with Differential/Platelet  Tomorrow morning,   R       Question:  Specimen collection method  Answer:  Lab=Lab collect   05/19/21 1458   05/20/21 1829  Basic metabolic panel  Tomorrow morning,   R       Question:  Specimen collection method  Answer:  Lab=Lab collect   05/19/21 1458            Signed, Terrilee Croak, MD [facility] 05/19/2021

## 2021-05-20 LAB — CBC WITH DIFFERENTIAL/PLATELET
Abs Immature Granulocytes: 0.1 10*3/uL — ABNORMAL HIGH (ref 0.00–0.07)
Basophils Absolute: 0 10*3/uL (ref 0.0–0.1)
Basophils Relative: 0 %
Eosinophils Absolute: 0 10*3/uL (ref 0.0–0.5)
Eosinophils Relative: 0 %
HCT: 30.6 % — ABNORMAL LOW (ref 39.0–52.0)
Hemoglobin: 10.1 g/dL — ABNORMAL LOW (ref 13.0–17.0)
Immature Granulocytes: 1 %
Lymphocytes Relative: 15 %
Lymphs Abs: 1.3 10*3/uL (ref 0.7–4.0)
MCH: 30.5 pg (ref 26.0–34.0)
MCHC: 33 g/dL (ref 30.0–36.0)
MCV: 92.4 fL (ref 80.0–100.0)
Monocytes Absolute: 0.8 10*3/uL (ref 0.1–1.0)
Monocytes Relative: 10 %
Neutro Abs: 6.3 10*3/uL (ref 1.7–7.7)
Neutrophils Relative %: 74 %
Platelets: 261 10*3/uL (ref 150–400)
RBC: 3.31 MIL/uL — ABNORMAL LOW (ref 4.22–5.81)
RDW: 13.3 % (ref 11.5–15.5)
WBC: 8.5 10*3/uL (ref 4.0–10.5)
nRBC: 0 % (ref 0.0–0.2)

## 2021-05-20 LAB — GLUCOSE, CAPILLARY
Glucose-Capillary: 158 mg/dL — ABNORMAL HIGH (ref 70–99)
Glucose-Capillary: 207 mg/dL — ABNORMAL HIGH (ref 70–99)
Glucose-Capillary: 261 mg/dL — ABNORMAL HIGH (ref 70–99)
Glucose-Capillary: 219 mg/dL — ABNORMAL HIGH (ref 70–99)

## 2021-05-20 LAB — BASIC METABOLIC PANEL
Anion gap: 8 (ref 5–15)
BUN: 36 mg/dL — ABNORMAL HIGH (ref 8–23)
CO2: 26 mmol/L (ref 22–32)
Calcium: 10.1 mg/dL (ref 8.9–10.3)
Chloride: 98 mmol/L (ref 98–111)
Creatinine, Ser: 0.98 mg/dL (ref 0.61–1.24)
GFR, Estimated: >60 mL/min (ref >60)
Glucose, Bld: 175 mg/dL — ABNORMAL HIGH (ref 70–99)
Potassium: 4 mmol/L (ref 3.5–5.1)
Sodium: 132 mmol/L — ABNORMAL LOW (ref 135–145)

## 2021-05-20 LAB — CULTURE, BLOOD (ROUTINE X 2)
Culture: NO GROWTH
Culture: NO GROWTH
Special Requests: ADEQUATE
Special Requests: ADEQUATE

## 2021-05-20 LAB — RESP PANEL BY RT-PCR (FLU A&B, COVID) ARPGX2
Influenza A by PCR: NEGATIVE
Influenza B by PCR: NEGATIVE
SARS Coronavirus 2 by RT PCR: NEGATIVE

## 2021-05-20 MED ORDER — CARVEDILOL 6.25 MG PO TABS: 6.2500 mg | ORAL_TABLET | Freq: Two times a day (BID) | ORAL | Status: DC

## 2021-05-20 MED ORDER — COLCHICINE 0.6 MG PO TABS: 0.6000 mg | ORAL_TABLET | Freq: Every day | ORAL | Status: DC

## 2021-05-20 MED ORDER — CARVEDILOL 6.25 MG PO TABS
6.2500 mg | ORAL_TABLET | Freq: Two times a day (BID) | ORAL | Status: DC
  Administered 2021-05-20: 17:00:00 6.25 mg via ORAL
  Filled 2021-05-20: qty 1

## 2021-05-20 MED ORDER — FOLIC ACID 1 MG PO TABS
1.0000 mg | ORAL_TABLET | Freq: Every day | ORAL | Status: DC
Start: 2021-05-21 — End: 2021-11-09

## 2021-05-20 MED ORDER — POTASSIUM CHLORIDE CRYS ER 20 MEQ PO TBCR: 20.0000 meq | EXTENDED_RELEASE_TABLET | Freq: Every day | ORAL | Status: DC

## 2021-05-20 MED ORDER — DICLOFENAC SODIUM 1 % EX GEL: 2.0000 g | Freq: Four times a day (QID) | CUTANEOUS | Status: DC

## 2021-05-20 MED ORDER — INSULIN ASPART 100 UNIT/ML IJ SOLN: 0.0000 [IU] | Freq: Three times a day (TID) | INTRAMUSCULAR | 11 refills | Status: DC

## 2021-05-20 MED ORDER — INSULIN ASPART 100 UNIT/ML IJ SOLN: 0.0000 [IU] | Freq: Every day | INTRAMUSCULAR | 11 refills | Status: DC

## 2021-05-20 MED ORDER — ALPRAZOLAM 1 MG PO TABS: 1.0000 mg | ORAL_TABLET | Freq: Every day | ORAL | 0 refills | Status: AC

## 2021-05-20 MED ORDER — DICLOFENAC SODIUM 1 % EX GEL
2.0000 g | Freq: Four times a day (QID) | CUTANEOUS | Status: DC
  Administered 2021-05-20 – 2021-05-22 (×7): 2 g via TOPICAL
  Filled 2021-05-20: qty 100

## 2021-05-20 MED ORDER — PREDNISONE 20 MG PO TABS: 40.0000 mg | ORAL_TABLET | Freq: Every day | ORAL | Status: AC

## 2021-05-20 MED ORDER — AMOXICILLIN-POT CLAVULANATE 875-125 MG PO TABS: 1.0000 | ORAL_TABLET | Freq: Two times a day (BID) | ORAL | Status: AC

## 2021-05-20 MED ORDER — LISINOPRIL 10 MG PO TABS: 10.0000 mg | ORAL_TABLET | Freq: Every morning | ORAL | Status: DC

## 2021-05-20 MED ORDER — APIXABAN 5 MG PO TABS: 5.0000 mg | ORAL_TABLET | Freq: Two times a day (BID) | ORAL | Status: DC

## 2021-05-20 MED ORDER — MELATONIN 3 MG PO TABS: 3.0000 mg | ORAL_TABLET | Freq: Every evening | ORAL | 0 refills | Status: DC | PRN

## 2021-05-20 MED ORDER — MOMETASONE FURO-FORMOTEROL FUM 100-5 MCG/ACT IN AERO: 2.0000 | INHALATION_SPRAY | Freq: Two times a day (BID) | RESPIRATORY_TRACT | Status: DC

## 2021-05-20 MED ORDER — FUROSEMIDE 40 MG PO TABS: 40.0000 mg | ORAL_TABLET | Freq: Two times a day (BID) | ORAL | 0 refills | Status: DC

## 2021-05-20 NOTE — Progress Notes (Signed)
PROGRESS NOTE  Francis Cardenas  DOB: 03-02-1951  PCP: Gerome Sam, MD HQI:[id number]  DOA: 05/14/2021  LOS: 6 days  Hospital Day: 7  Chief Complaint  Patient presents with   Shortness of Breath    Brief narrative: Francis Cardenas is a 70 y.o. male with PMH significant for morbid obesity, OSA on CPAP, OSA, HTN, chronic diastolic CHF and recently diagnosed A. fib today.  Patient reports progressive weight gain about 30 pounds in the last 2 months with insidious shortness of breath for which she presented to the ED on 12/23.  He was noted to be fluid overloaded.   Patient was started on IV diuretic, IV heparin and admitted to hospitalist service.  Subjective: Patient was seen and examined this morning.   Pleasant elderly Caucasian male.  Morbidly obese.  Lying on bed.  Not in distress.  He is happy that he was able to move a little bit with physical therapy today.    Assessment/Plan: Acute on chronic diastolic CHF -Patient presented with 30 pound weight gain in the last 2 months despite reported compliance to diuretics.   -Echo on 12/16 at [facility] with EF 13%, RV systolic pressure 24-40 suggestive of mild to moderate pulm hypertension  -He is currently being diuresed with IV Lasix.  Negative balance of almost 10 L since admission.   -Also on Coreg.  ACE inhibitor on hold -Net IO Since Admission: -9,787.84 mL [05/20/21 1309] -Continue to monitor for daily intake output, weight, blood pressure, BNP, renal function and electrolytes. Recent Labs  Lab 05/14/21 [phone] 05/14/21 0639 05/15/21 0329 05/16/21 0030 05/17/21 0145 05/18/21 0043 05/19/21 0115 05/20/21 0510  BNP  --  458.2*  --   --   --   --   --   --   BUN 31*  --    < > 20 22 26* 33* 36*  CREATININE 1.57*  --    < > 1.39* 1.36* 1.38* 1.22 0.98  K 5.2*  --    < > 4.2 3.9 4.3 4.2 4.0  MG 2.0  --   --   --   --   --  2.4  --    < > = values in this interval not displayed.    A. Fib Sinus bradycardia -Recently  diagnosed as an outpatient by PCP.  Has Zio patch in place -Currently he has sinus bradycardia down to 40s.  I reduced carvedilol dose from 12.5 mg daily to 6.25 mg twice daily today. -CHADSVASC score 2.  Initially started on heparin drip.  Later transitioned to Eliquis. -Follow-up with cardiology at [place] after discharge.   Acute gout -Complains of fever, polyarthralgia, elevated uric acid level to 11.8.   -Started on Solu-Medrol and colchicine with improvement in joint pain and fever  -Currently on colchicine 0.6 mg daily and prednisone 40 daily.  Enterococcus UTI -Currently on Augmentin for 5 days.  Chronic kidney disease stage IIIa -Creatinine was 1.57 at the time of admission.  -Renal function is stable. -ACE inhibitor and Aldactone on hold.   Hyponatremia  -Sodium level between 130 and 135. Recent Labs  Lab 05/14/21 0619 05/15/21 0329 05/16/21 0030 05/17/21 0145 05/18/21 0043 05/19/21 0115 05/20/21 0510  NA 129* 134* 134* 134* 129* 131* 132*    Essential hypertension -Blood pressure is reasonably well controlled.  History of COPD -Stable. Continue nebulizer treatments as needed.  Saturating normal on room air.  No wheezing appreciated.   Type 2 diabetes mellitus with hyperglycemia -  A1c 5.5 on 05/15/2021 -Diet controlled at home.  Currently blood sugar elevated because of steroids. -Currently on sliding scale insulin with Accu-Cheks. Recent Labs  Lab 05/19/21 1212 05/19/21 1651 05/19/21 2127 05/20/21 0556 05/20/21 1105  GLUCAP 248* 257* 244* 158* 207*    Chronic alcohol abuse -Reportedly, patient was drinking 8 cans of beers on a daily basis.   -No withdrawal symptoms in the hospital.   -Continue thiamine multivitamins.     Normocytic anemia -Stable hemoglobin between 9-10 without evidence of iron, folate or vitamin B12 deficiency Recent Labs    05/15/21 0329 05/16/21 0030 05/17/21 0145 05/18/21 0043 05/20/21 0510  HGB 10.2* 9.5* 9.5*  9.4* 10.1*  MCV 94.6 96.8 95.1 93.7 92.4  VITAMINB12 470  --   --   --   --   FOLATE 26.5  --   --   --   --   FERRITIN 79  --   --   --   --   TIBC 423  --   --   --   --   IRON 26*  --   --   --   --   RETICCTPCT 2.9  --   --   --   --     Obstructive sleep apnea Continue CPAP   Morbid obesity  -Body mass index is 41.19 kg/m. Patient has been advised to make an attempt to improve diet and exercise patterns to aid in weight loss.  Mobility: Encourage ambulation Living condition: Was living at home with wife Goals of care:   Code Status: Full Code  Nutritional status: Body mass index is 41.19 kg/m.      Diet:  Diet Order             Diet heart healthy/carb modified Room service appropriate? Yes; Fluid consistency: Thin  Diet effective now                  DVT prophylaxis:   apixaban (ELIQUIS) tablet 5 mg   Antimicrobials: Augmentin Fluid: None Consultants: None Family Communication: None at bedside  Status is: Inpatient  Continue in-hospital care because: Pending SNF Level of care: Telemetry Cardiac   Dispo: The patient is from: Home              Anticipated d/c is to: SNF              Patient currently is medically stable to d/c.   Difficult to place patient No     Infusions:   sodium chloride      Scheduled Meds:  amoxicillin-clavulanate  1 tablet Oral Q12H   apixaban  5 mg Oral BID   aspirin EC  81 mg Oral Q breakfast   atorvastatin  40 mg Oral Daily   carvedilol  6.25 mg Oral BID WC   colchicine  0.6 mg Oral Daily   folic acid  1 mg Oral Daily   furosemide  40 mg Intravenous Q12H   gabapentin  300 mg Oral BID   insulin aspart  0-15 Units Subcutaneous TID WC   insulin aspart  0-5 Units Subcutaneous QHS   mometasone-formoterol  2 puff Inhalation BID   multivitamin with minerals  1 tablet Oral Daily   pantoprazole  40 mg Oral Daily   predniSONE  40 mg Oral Q breakfast   sodium chloride flush  3 mL Intravenous Q12H   thiamine  100 mg Oral  Daily   Or   thiamine  100 mg Intravenous  Daily    PRN meds: sodium chloride, acetaminophen, ALPRAZolam, magnesium hydroxide, melatonin, ondansetron (ZOFRAN) IV, sodium chloride flush   Antimicrobials: Anti-infectives (From admission, onward)    Start     Dose/Rate Route Frequency Ordered Stop   05/19/21 1545  amoxicillin-clavulanate (AUGMENTIN) 875-125 MG per tablet 1 tablet        1 tablet Oral Every 12 hours 05/19/21 1453 05/21/21 2159       Objective: Vitals:   05/20/21 0836 05/20/21 1048  BP: (!) 145/59 (!) 148/54  Pulse:  (!) 59  Resp:  14  Temp:  97.8 F (36.6 C)  SpO2:  97%    Intake/Output Summary (Last 24 hours) at 05/20/2021 1309 Last data filed at 05/20/2021 0944 Gross per 24 hour  Intake 603 ml  Output 3000 ml  Net -2397 ml    Filed Weights   05/17/21 0428 05/19/21 0630 05/20/21 0557  Weight: 130.3 kg 131 kg 130.2 kg   Weight change: -0.8 kg Body mass index is 41.19 kg/m.   Physical Exam: General exam: Pleasant, elderly Caucasian male.  Not in physical distress.  Morbid obesity Skin: No rashes, lesions or ulcers. HEENT: Atraumatic, normocephalic, no obvious bleeding Lungs: Clear to auscultation bilaterally CVS: Slow heart rate, regular rhythm, no murmur GI/Abd soft, nontender, nondistended, bowel sound present CNS: Alert, awake, oriented x3 Psychiatry: Mood appropriate  Extremities: no pedal edema, no calf tenderness  Data Review: I have personally reviewed the laboratory data and studies available.  F/u labs ordered Unresulted Labs (From admission, onward)    None       Signed, Terrilee Croak, MD [facility] 05/20/2021

## 2021-05-20 NOTE — Discharge Summary (Signed)
Physician Discharge Summary  Francis Cardenas KTG:[id number] DOB: [birth year] DOA: 05/14/2021  PCP: Gerome Sam, MD  Admit date: 05/14/2021 Discharge date: 05/20/2021  Admitted From: Home Discharge disposition: SNF   Code Status: Full Code   Discharge Diagnosis:   Principal Problem:   Acute diastolic CHF (congestive heart failure) ([place]) Active Problems:   Benign essential hypertension   COPD (chronic obstructive pulmonary disease) ([place])   Diabetes mellitus type 2 in obese ([facility])   Chest pain syndrome   CHF (congestive heart failure) (H[facility])   OSA on CPAP   Morbid obesity ([place])   Unspecified atrial fibrillation (H[facility])   Acute diastolic (congestive) heart failure ([place])   Pressure injury of skin    Chief Complaint  Patient presents with   Shortness of Breath    Brief narrative: Francis Cardenas is a 70 y.o. male with PMH significant for morbid obesity, OSA on CPAP, OSA, HTN, chronic diastolic CHF and recently diagnosed A. fib today.  Patient reports progressive weight gain about 30 pounds in the last 2 months with insidious shortness of breath for which she presented to the ED on 12/23.  He was noted to be fluid overloaded.   Patient was started on IV diuretic, IV heparin and admitted to hospitalist service.  Subjective: Patient was seen and examined this morning.   Pleasant elderly Caucasian male.  Morbidly obese.  Lying on bed.  Not in distress.  He is happy that he was able to move a little bit with physical therapy today.    Hospital course: Acute on chronic diastolic CHF Essential hypertension -Patient presented with 30 pound weight gain in the last 2 months despite reported compliance to diuretics.   -Echo on 12/16 at [facility] with EF 42%, RV systolic pressure 87-68 suggestive of mild to moderate pulm hypertension  -He was diuresed with IV Lasix.  Negative balance of almost 10 L since admission.   -Also on Coreg.  ACE inhibitor on hold -Net IO Since Admission:  -9,787.84 mL [05/20/21 1420] -At discharge, I switched him to Lasix oral 40 mg twice daily.  Resume lisinopril and Aldactone at lower doses. -Coreg dose was reduced because of bradycardia. -Continue to monitor for daily intake output, weight, blood pressure, BNP, renal function and electrolytes. Recent Labs  Lab 05/14/21 [phone] 05/14/21 0639 05/15/21 0329 05/16/21 0030 05/17/21 0145 05/18/21 0043 05/19/21 0115 05/20/21 0510  BNP  --  458.2*  --   --   --   --   --   --   BUN 31*  --    < > 20 22 26* 33* 36*  CREATININE 1.57*  --    < > 1.39* 1.36* 1.38* 1.22 0.98  K 5.2*  --    < > 4.2 3.9 4.3 4.2 4.0  MG 2.0  --   --   --   --   --  2.4  --    < > = values in this interval not displayed.   A. Fib Sinus bradycardia -Recently diagnosed as an outpatient by PCP.  Has Zio patch in place -Currently he has sinus bradycardia down to 40s.  I reduced carvedilol dose from 12.5 mg daily to 6.25 mg twice daily today. -CHADSVASC score 2.  Initially started on heparin drip.  Later transitioned to Eliquis. -Follow-up with cardiology at [place] after discharge.   Acute gout -Complains of fever, polyarthralgia, elevated uric acid level to 11.8.   -Started on Solu-Medrol and colchicine with improvement in joint  pain and fever  -Currently on colchicine 0.6 mg daily and prednisone 40 daily.  3 more days of prednisone.  Colchicine to continue.  Enterococcus UTI -Currently on a 5-day course of oral Augmentin.    Chronic kidney disease stage IIIa -Creatinine was 1.57 at the time of admission.  -Renal function is stable.   Hyponatremia  -Sodium level between 130 and 135. Recent Labs  Lab 05/14/21 0619 05/15/21 0329 05/16/21 0030 05/17/21 0145 05/18/21 0043 05/19/21 0115 05/20/21 0510  NA 129* 134* 134* 134* 129* 131* 132*   History of COPD -Stable. Continue nebulizer treatments as needed.  Saturating normal on room air.  No wheezing appreciated.   Type 2 diabetes mellitus with  hyperglycemia -A1c 5.5 on 05/15/2021 -Diet controlled at home.  Currently blood sugar elevated because of steroids. -Currently on sliding scale insulin with Accu-Cheks. Recent Labs  Lab 05/19/21 1212 05/19/21 1651 05/19/21 2127 05/20/21 0556 05/20/21 1105  GLUCAP 248* 257* 244* 158* 207*   Chronic alcohol abuse -Reportedly, patient was drinking 8 cans of beers on a daily basis.   -No withdrawal symptoms in the hospital.   -Continue thiamine multivitamins.     Normocytic anemia -Stable hemoglobin between 9-10 without evidence of iron, folate or vitamin B12 deficiency Recent Labs    05/15/21 0329 05/16/21 0030 05/17/21 0145 05/18/21 0043 05/20/21 0510  HGB 10.2* 9.5* 9.5* 9.4* 10.1*  MCV 94.6 96.8 95.1 93.7 92.4  VITAMINB12 470  --   --   --   --   FOLATE 26.5  --   --   --   --   FERRITIN 79  --   --   --   --   TIBC 423  --   --   --   --   IRON 26*  --   --   --   --   RETICCTPCT 2.9  --   --   --   --    Obstructive sleep apnea Continue CPAP   Morbid obesity  -Body mass index is 41.19 kg/m. Patient has been advised to make an attempt to improve diet and exercise patterns to aid in weight loss.  Mobility: Encourage ambulation Living condition: Was living at home with wife Goals of care:   Code Status: Full Code  Nutritional status: Body mass index is 41.19 kg/m.      Discharge Medications:   Allergies as of 05/20/2021       Reactions   Oxcarbazepine Other (See Comments)   Dizziness/ lightheaded   Zolpidem Nausea Only        Medication List     STOP taking these medications    amLODipine 10 MG tablet Commonly known as: NORVASC   aspirin EC 81 MG tablet   budesonide-formoterol 80-4.5 MCG/ACT inhaler Commonly known as: SYMBICORT Replaced by: mometasone-formoterol 100-5 MCG/ACT Aero   hydrALAZINE 25 MG tablet Commonly known as: APRESOLINE   meloxicam 15 MG tablet Commonly known as: MOBIC   torsemide 20 MG tablet Commonly known as:  DEMADEX       TAKE these medications    albuterol 108 (90 Base) MCG/ACT inhaler Commonly known as: VENTOLIN HFA Inhale 2 puffs into the lungs every 6 (six) hours as needed for wheezing.   ALPRAZolam 1 MG tablet Commonly known as: XANAX Take 1 tablet (1 mg total) by mouth at bedtime for 5 days.   amoxicillin-clavulanate 875-125 MG tablet Commonly known as: AUGMENTIN Take 1 tablet by mouth every 12 (twelve) hours for 3  days.   apixaban 5 MG Tabs tablet Commonly known as: ELIQUIS Take 1 tablet (5 mg total) by mouth 2 (two) times daily.   atorvastatin 80 MG tablet Commonly known as: LIPITOR Take 40 mg by mouth daily with supper.   Carboxymethylcellulose Sodium 0.25 % Soln Place 1 drop into both eyes 3 (three) times daily as needed (dry eyes/ irritation).   carvedilol 6.25 MG tablet Commonly known as: COREG Take 1 tablet (6.25 mg total) by mouth 2 (two) times daily with a meal. What changed:  medication strength how much to take additional instructions   colchicine 0.6 MG tablet Take 1 tablet (0.6 mg total) by mouth daily. Start taking on: May 21, 2021   diclofenac Sodium 1 % Gel Commonly known as: VOLTAREN Apply 2 g topically 4 (four) times daily.   Ensure Max Protein Liqd Take 330 mLs (11 oz total) by mouth 2 (two) times daily. What changed:  when to take this reasons to take this   folic acid 1 MG tablet Commonly known as: FOLVITE Take 1 tablet (1 mg total) by mouth daily. Start taking on: May 21, 2021   furosemide 40 MG tablet Commonly known as: Lasix Take 1 tablet (40 mg total) by mouth 2 (two) times daily.   gabapentin 300 MG capsule Commonly known as: NEURONTIN Take 300 mg by mouth 3 (three) times daily.   guaifenesin 400 MG Tabs tablet Commonly known as: HUMIBID E Take 400 mg by mouth every morning.   insulin aspart 100 UNIT/ML injection Commonly known as: novoLOG Inject 0-15 Units into the skin 3 (three) times daily with meals.    insulin aspart 100 UNIT/ML injection Commonly known as: novoLOG Inject 0-5 Units into the skin at bedtime.   lisinopril 10 MG tablet Commonly known as: ZESTRIL Take 1 tablet (10 mg total) by mouth every morning. Hold for SBP <100 What changed:  medication strength how much to take   Magnesium Oxide 420 MG Tabs Take 420 mg by mouth daily with supper.   melatonin 3 MG Tabs tablet Take 1 tablet (3 mg total) by mouth at bedtime as needed.   mometasone-formoterol 100-5 MCG/ACT Aero Commonly known as: DULERA Inhale 2 puffs into the lungs 2 (two) times daily. Replaces: budesonide-formoterol 80-4.5 MCG/ACT inhaler   multivitamin with minerals tablet Take 1 tablet by mouth daily with lunch.   omeprazole 20 MG capsule Commonly known as: PRILOSEC Take 20 mg by mouth daily with supper.   potassium chloride SA 20 MEQ tablet Commonly known as: KLOR-CON M Take 1 tablet (20 mEq total) by mouth daily with supper. What changed:  how much to take when to take this   predniSONE 20 MG tablet Commonly known as: DELTASONE Take 2 tablets (40 mg total) by mouth daily with breakfast for 3 days. Start taking on: May 21, 2021   PRESCRIPTION MEDICATION Inhale into the lungs at bedtime. CPAP   spironolactone 25 MG tablet Commonly known as: ALDACTONE Take 25 mg by mouth every morning.   thiamine 100 MG tablet Take 1 tablet (100 mg total) by mouth daily. What changed: additional instructions   vitamin B-12 500 MCG tablet Commonly known as: CYANOCOBALAMIN Take 500 mcg by mouth daily with supper.   VITAMIN D3 PO Take 1 tablet by mouth daily with supper.   Wixela Inhub 100-50 MCG/ACT Aepb Generic drug: fluticasone-salmeterol Inhale 1 puff into the lungs every morning.               Discharge Care  Instructions  (From admission, onward)           Start     Ordered   05/20/21 0000  Discharge wound care:        05/20/21 1420            Wound care:   Pressure  Injury 05/15/21 Buttocks Left Stage 2 -  Partial thickness loss of dermis presenting as a shallow open injury with a red, pink wound bed without slough. bruising around the site vs. deep tissue injury (Active)  Date First Assessed/Time First Assessed: 05/15/21 0300   Location: Buttocks  Location Orientation: Left  Staging: Stage 2 -  Partial thickness loss of dermis presenting as a shallow open injury with a red, pink wound bed without slough.  Wound Descrip...    Assessments 05/15/2021  3:00 AM 05/19/2021  8:40 PM  Dressing Type Foam - Lift dressing to assess site every shift Foam - Lift dressing to assess site every shift  Dressing Clean;Dry;Intact Clean;Dry;Intact  Dressing Change Frequency Every 3 days --  State of Healing Early/partial granulation --  Site / Wound Assessment Clean;Dry;Painful;Red;Pink;Granulation tissue Clean;Dry  % Wound base Red or Granulating 100% --  Peri-wound Assessment Erythema (non-blanchable);Intact;Purple --  Wound Length (cm) 2.5 cm --  Wound Width (cm) 2.5 cm --  Wound Depth (cm) 0.2 cm --  Wound Surface Area (cm^2) 6.25 cm^2 --  Wound Volume (cm^3) 1.25 cm^3 --  Margins -- Unattached edges (unapproximated)  Drainage Amount Scant None  Drainage Description Sanguineous --     No Linked orders to display    Discharge Instructions:   Discharge Instructions     Call MD for:  difficulty breathing, headache or visual disturbances   Complete by: As directed    Call MD for:  extreme fatigue   Complete by: As directed    Call MD for:  hives   Complete by: As directed    Call MD for:  persistant dizziness or light-headedness   Complete by: As directed    Call MD for:  persistant nausea and vomiting   Complete by: As directed    Call MD for:  severe uncontrolled pain   Complete by: As directed    Call MD for:  temperature >100.4   Complete by: As directed    Diet - low sodium heart healthy   Complete by: As directed    Diet Carb Modified   Complete  by: As directed    Discharge instructions   Complete by: As directed    Discharge instructions for diabetes mellitus: Check blood sugar 3 times a day and bedtime at home. If blood sugar running above 200 or less than 70 please call your MD to adjust insulin. If you notice signs and symptoms of hypoglycemia (low blood sugar) like jitteriness, confusion, thirst, tremor and sweating, please check blood sugar, drink sugary drink/biscuits/sweets to increase sugar level and call MD or return to ER.    Discharge instructions for CHF Check weight daily -preferably same time every day. Restrict fluid intake to 1200 ml daily Restrict salt intake to less than 2 g daily. Call MD if you have one of the following symptoms 1) 3 pound weight gain in 24 hours or 5 pounds in 1 week  2) swelling in the hands, feet or stomach  3) progressive shortness of breath 4) if you have to sleep on extra pillows at night in order to breathe     General discharge instructions:  Follow with  Primary MD Gerome Sam, MD in 7 days   Get CBC/BMP checked in next visit within 1 week by PCP or SNF MD. (We routinely change or add medications that can affect your baseline labs and fluid status, therefore we recommend that you get the mentioned basic workup next visit with your PCP, your PCP may decide not to get them or add new tests based on their clinical decision)  On your next visit with your PCP, please get your medicines reviewed and adjusted.  Please request your PCP  to go over all hospital tests, procedures, radiology results at the follow up, please get all Hospital records sent to your PCP by signing hospital release before you go home.  Activity: As tolerated with Full fall precautions use walker/cane & assistance as needed  Avoid using any recreational substances like cigarette, tobacco, alcohol, or non-prescribed drug.  If you experience worsening of your admission symptoms, develop shortness  of breath, life threatening emergency, suicidal or homicidal thoughts you must seek medical attention immediately by calling 911 or calling your MD immediately  if symptoms less severe.  You must read complete instructions/literature along with all the possible adverse reactions/side effects for all the medicines you take and that have been prescribed to you. Take any new medicine only after you have completely understood and accepted all the possible adverse reactions/side effects.   Do not drive, operate heavy machinery, perform activities at heights, swimming or participation in water activities or provide baby sitting services if your were admitted for syncope or siezures until you have seen by Primary MD or a Neurologist and advised to do so again.  Do not drive when taking Pain medications.  Do not take more than prescribed Pain, Sleep and Anxiety Medications  Wear Seat belts while driving.  Please note You were cared for by a hospitalist during your hospital stay. If you have any questions about your discharge medications or the care you received while you were in the hospital after you are discharged, you can call the unit and asked to speak with the hospitalist on call if the hospitalist that took care of you is not available. Once you are discharged, your primary care physician will handle any further medical issues. Please note that NO REFILLS for any discharge medications will be authorized once you are discharged, as it is imperative that you return to your primary care physician (or establish a relationship with a primary care physician if you do not have one) for your aftercare needs so that they can reassess your need for medications and monitor your lab values.   Discharge wound care:   Complete by: As directed    Increase activity slowly   Complete by: As directed        Follow ups:    Follow-up Information     Gerome Sam, MD Follow up.   Specialty: Internal  Medicine Contact information: [place] [address] [phone]         Sueanne Margarita, MD .   Specialty: Cardiology Contact information: [address] [place] [address] [phone]                 Discharge Exam:   Vitals:   05/20/21 0557 05/20/21 0835 05/20/21 0836 05/20/21 1048  BP:  (!) 144/49 (!) 145/59 (!) 148/54  Pulse:  (!) 59  (!) 59  Resp:  (!) 23  14  Temp:  97.8 F (36.6 C)  97.8 F (36.6 C)  TempSrc:  Oral  Oral  SpO2:  95%  97%  Weight: 130.2 kg     Height:        Body mass index is 41.19 kg/m.  General exam: Pleasant, elderly Caucasian male.  Not in physical distress.  Morbid obesity Skin: No rashes, lesions or ulcers. HEENT: Atraumatic, normocephalic, no obvious bleeding Lungs: Clear to auscultation bilaterally CVS: Slow heart rate, regular rhythm, no murmur GI/Abd soft, nontender, nondistended, bowel sound present CNS: Alert, awake, oriented x3 Psychiatry: Mood appropriate  Extremities: no pedal edema, no calf tenderness  Time coordinating discharge: 35 minutes   The results of significant diagnostics from this hospitalization (including imaging, microbiology, ancillary and laboratory) are listed below for reference.    Procedures and Diagnostic Studies:   DG CHEST PORT 1 VIEW  Result Date: 05/15/2021 CLINICAL DATA:  Fevers EXAM: PORTABLE CHEST 1 VIEW COMPARISON:  Film from the previous day. FINDINGS: Cardiac shadow is stable. Lungs are well aerated bilaterally. No focal infiltrate or sizable effusion is seen. No bony abnormality is noted. Previously seen vascular congestion has resolved. IMPRESSION: Resolution of previously seen vascular congestion. No acute abnormality noted. Electronically Signed   By: Inez Catalina M.D.   On: 05/15/2021 21:15   DG Chest Port 1 View  Result Date: 05/14/2021 CLINICAL DATA:  Provided history: Shortness of breath. EXAM: PORTABLE CHEST 1 VIEW COMPARISON:  Prior  chest radiographs 12/16/2019 and earlier. FINDINGS: An electronic device projects over the left upper chest. Heart size within normal limits. Central pulmonary vascular congestion without overt pulmonary edema. No appreciable airspace consolidation. No evidence of pleural effusion or pneumothorax. No acute bony abnormality identified. IMPRESSION: Central pulmonary vascular congestion without overt pulmonary edema. No appreciable airspace consolidation. Electronically Signed   By: Kellie Simmering D.O.   On: 05/14/2021 08:07     Labs:   Basic Metabolic Panel: Recent Labs  Lab 05/14/21 0619 05/15/21 0329 05/16/21 0030 05/17/21 0145 05/18/21 0043 05/19/21 0115 05/20/21 0510  NA 129*   < > 134* 134* 129* 131* 132*  K 5.2*   < > 4.2 3.9 4.3 4.2 4.0  CL 98   < > 100 100 95* 94* 98  CO2 24   < > 25 24 26 27 26   GLUCOSE 119*   < > 124* 126* 210* 222* 175*  BUN 31*   < > 20 22 26* 33* 36*  CREATININE 1.57*   < > 1.39* 1.36* 1.38* 1.22 0.98  CALCIUM 10.4*   < > 9.9 9.9 10.1 10.0 10.1  MG 2.0  --   --   --   --  2.4  --    < > = values in this interval not displayed.   GFR Estimated Creatinine Clearance: 92.4 mL/min (by C-G formula based on SCr of 0.98 mg/dL). Liver Function Tests: Recent Labs  Lab 05/15/21 2045  AST 14*  ALT 15  ALKPHOS 58  BILITOT 0.9  PROT 6.5  ALBUMIN 3.6   No results for input(s): LIPASE, AMYLASE in the last 168 hours. No results for input(s): AMMONIA in the last 168 hours. Coagulation profile No results for input(s): INR, PROTIME in the last 168 hours.  CBC: Recent Labs  Lab 05/15/21 0329 05/16/21 0030 05/17/21 0145 05/18/21 0043 05/20/21 0510  WBC 7.9 8.3 11.6* 10.0 8.5  NEUTROABS  --   --   --   --  6.3  HGB 10.2* 9.5* 9.5* 9.4* 10.1*  HCT 31.4* 29.9* 29.1* 28.1*  30.6*  MCV 94.6 96.8 95.1 93.7 92.4  PLT 202 181 174 184 261   Cardiac Enzymes: No results for input(s): CKTOTAL, CKMB, CKMBINDEX, TROPONINI in the last 168 hours. BNP: Invalid  input(s): POCBNP CBG: Recent Labs  Lab 05/19/21 1212 05/19/21 1651 05/19/21 2127 05/20/21 0556 05/20/21 1105  GLUCAP 248* 257* 244* 158* 207*   D-Dimer No results for input(s): DDIMER in the last 72 hours. Hgb A1c No results for input(s): HGBA1C in the last 72 hours. Lipid Profile No results for input(s): CHOL, HDL, LDLCALC, TRIG, CHOLHDL, LDLDIRECT in the last 72 hours. Thyroid function studies No results for input(s): TSH, T4TOTAL, T3FREE, THYROIDAB in the last 72 hours.  Invalid input(s): FREET3 Anemia work up No results for input(s): VITAMINB12, FOLATE, FERRITIN, TIBC, IRON, RETICCTPCT in the last 72 hours. Microbiology Recent Results (from the past 240 hour(s))  Resp Panel by RT-PCR (Flu A&B, Covid) Nasopharyngeal Swab     Status: None   Collection Time: 05/14/21  1:46 PM   Specimen: Nasopharyngeal Swab; Nasopharyngeal(NP) swabs in vial transport medium  Result Value Ref Range Status   SARS Coronavirus 2 by RT PCR NEGATIVE NEGATIVE Final    Comment: (NOTE) SARS-CoV-2 target nucleic acids are NOT DETECTED.  The SARS-CoV-2 RNA is generally detectable in upper respiratory specimens during the acute phase of infection. The lowest concentration of SARS-CoV-2 viral copies this assay can detect is 138 copies/mL. A negative result does not preclude SARS-Cov-2 infection and should not be used as the sole basis for treatment or other patient management decisions. A negative result may occur with  improper specimen collection/handling, submission of specimen other than nasopharyngeal swab, presence of viral mutation(s) within the areas targeted by this assay, and inadequate number of viral copies(<138 copies/mL). A negative result must be combined with clinical observations, patient history, and epidemiological information. The expected result is Negative.  Fact Sheet for Patients:  EntrepreneurPulse.com.au  Fact Sheet for Healthcare Providers:   IncredibleEmployment.be  This test is no t yet approved or cleared by the [place] FDA and  has been authorized for detection and/or diagnosis of SARS-CoV-2 by FDA under an Emergency Use Authorization (EUA). This EUA will remain  in effect (meaning this test can be used) for the duration of the COVID-19 declaration under Section 564(b)(1) of the Act, 21 U.S.C.section 360bbb-3(b)(1), unless the authorization is terminated  or revoked sooner.       Influenza A by PCR NEGATIVE NEGATIVE Final   Influenza B by PCR NEGATIVE NEGATIVE Final    Comment: (NOTE) The Xpert Xpress SARS-CoV-2/FLU/RSV plus assay is intended as an aid in the diagnosis of influenza from Nasopharyngeal swab specimens and should not be used as a sole basis for treatment. Nasal washings and aspirates are unacceptable for Xpert Xpress SARS-CoV-2/FLU/RSV testing.  Fact Sheet for Patients: EntrepreneurPulse.com.au  Fact Sheet for Healthcare Providers: IncredibleEmployment.be  This test is not yet approved or cleared by the [place] FDA and has been authorized for detection and/or diagnosis of SARS-CoV-2 by FDA under an Emergency Use Authorization (EUA). This EUA will remain in effect (meaning this test can be used) for the duration of the COVID-19 declaration under Section 564(b)(1) of the Act, 21 U.S.C. section 360bbb-3(b)(1), unless the authorization is terminated or revoked.  Performed at [facility], [address] [place], [place] [address]   MRSA Next Gen by PCR, Nasal     Status: None   Collection Time: 05/14/21  4:50 PM   Specimen: Nasal Mucosa; Nasal Swab  Result Value Ref Range Status   MRSA by PCR Next Gen NOT DETECTED NOT DETECTED Final    Comment: (NOTE) The GeneXpert MRSA Assay (FDA approved for NASAL specimens only), is one component of a comprehensive MRSA colonization surveillance program. It is not intended  to diagnose MRSA infection nor to guide or monitor treatment for MRSA infections. Test performance is not FDA approved in patients less than 44 years old. Performed at [facility], [place] [address], [place], [place] [address]   Culture, blood (routine x 2)     Status: None   Collection Time: 05/15/21  8:45 PM   Specimen: BLOOD RIGHT HAND  Result Value Ref Range Status   Specimen Description BLOOD RIGHT HAND  Final   Special Requests AEROBIC BOTTLE ONLY Blood Culture adequate volume  Final   Culture   Final    NO GROWTH 5 DAYS Performed at [facility], [place] [address] [place], [place] [address]    Report Status 05/20/2021 FINAL  Final  Culture, blood (routine x 2)     Status: None   Collection Time: 05/15/21  8:45 PM   Specimen: BLOOD RIGHT HAND  Result Value Ref Range Status   Specimen Description BLOOD RIGHT HAND  Final   Special Requests AEROBIC BOTTLE ONLY Blood Culture adequate volume  Final   Culture   Final    NO GROWTH 5 DAYS Performed at [facility], [place] [address] [place], [place] [address]    Report Status 05/20/2021 FINAL  Final  Urine Culture     Status: Abnormal   Collection Time: 05/15/21 10:04 PM   Specimen: Urine, Clean Catch  Result Value Ref Range Status   Specimen Description URINE, CLEAN CATCH  Final   Special Requests   Final    NONE Performed at [facility], [place] [address], [place], [place] [address]    Culture (A)  Final    >=100,000 COLONIES/mL ENTEROCOCCUS FAECALIS 80,000 COLONIES/mL STAPHYLOCOCCUS HAEMOLYTICUS    Report Status 05/19/2021 FINAL  Final   Organism ID, Bacteria ENTEROCOCCUS FAECALIS (A)  Final   Organism ID, Bacteria STAPHYLOCOCCUS HAEMOLYTICUS (A)  Final      Susceptibility   Enterococcus faecalis - MIC*    AMPICILLIN <=2 SENSITIVE Sensitive     NITROFURANTOIN <=16 SENSITIVE Sensitive     VANCOMYCIN 2 SENSITIVE Sensitive     * >=100,000 COLONIES/mL ENTEROCOCCUS FAECALIS   Staphylococcus haemolyticus -  MIC*    CIPROFLOXACIN <=0.5 SENSITIVE Sensitive     GENTAMICIN <=0.5 SENSITIVE Sensitive     NITROFURANTOIN 32 SENSITIVE Sensitive     OXACILLIN <=0.25 SENSITIVE Sensitive     TETRACYCLINE <=1 SENSITIVE Sensitive     VANCOMYCIN <=0.5 SENSITIVE Sensitive     TRIMETH/SULFA <=10 SENSITIVE Sensitive     CLINDAMYCIN RESISTANT Resistant     RIFAMPIN <=0.5 SENSITIVE Sensitive     Inducible Clindamycin POSITIVE Resistant     * 80,000 COLONIES/mL STAPHYLOCOCCUS HAEMOLYTICUS  Resp Panel by RT-PCR (Flu A&B, Covid) Nasopharyngeal Swab     Status: None   Collection Time: 05/16/21  8:00 PM   Specimen: Nasopharyngeal Swab; Nasopharyngeal(NP) swabs in vial transport medium  Result Value Ref Range Status   SARS Coronavirus 2 by RT PCR NEGATIVE NEGATIVE Final    Comment: (NOTE) SARS-CoV-2 target nucleic acids are NOT DETECTED.  The SARS-CoV-2 RNA is generally detectable in upper respiratory specimens during the acute phase of infection. The lowest concentration of SARS-CoV-2 viral copies this  assay can detect is 138 copies/mL. A negative result does not preclude SARS-Cov-2 infection and should not be used as the sole basis for treatment or other patient management decisions. A negative result may occur with  improper specimen collection/handling, submission of specimen other than nasopharyngeal swab, presence of viral mutation(s) within the areas targeted by this assay, and inadequate number of viral copies(<138 copies/mL). A negative result must be combined with clinical observations, patient history, and epidemiological information. The expected result is Negative.  Fact Sheet for Patients:  EntrepreneurPulse.com.au  Fact Sheet for Healthcare Providers:  IncredibleEmployment.be  This test is no t yet approved or cleared by the [place] FDA and  has been authorized for detection and/or diagnosis of SARS-CoV-2 by FDA under an Emergency Use Authorization  (EUA). This EUA will remain  in effect (meaning this test can be used) for the duration of the COVID-19 declaration under Section 564(b)(1) of the Act, 21 U.S.C.section 360bbb-3(b)(1), unless the authorization is terminated  or revoked sooner.       Influenza A by PCR NEGATIVE NEGATIVE Final   Influenza B by PCR NEGATIVE NEGATIVE Final    Comment: (NOTE) The Xpert Xpress SARS-CoV-2/FLU/RSV plus assay is intended as an aid in the diagnosis of influenza from Nasopharyngeal swab specimens and should not be used as a sole basis for treatment. Nasal washings and aspirates are unacceptable for Xpert Xpress SARS-CoV-2/FLU/RSV testing.  Fact Sheet for Patients: EntrepreneurPulse.com.au  Fact Sheet for Healthcare Providers: IncredibleEmployment.be  This test is not yet approved or cleared by the [place] FDA and has been authorized for detection and/or diagnosis of SARS-CoV-2 by FDA under an Emergency Use Authorization (EUA). This EUA will remain in effect (meaning this test can be used) for the duration of the COVID-19 declaration under Section 564(b)(1) of the Act, 21 U.S.C. section 360bbb-3(b)(1), unless the authorization is terminated or revoked.  Performed at [facility], [place] [address], [place], [place] [address]      Signed: Terrilee Croak  [facility] 05/20/2021, 2:20 PM

## 2021-05-20 NOTE — TOC Progression Note (Addendum)
Transition of Care [facility]) - Progression Note    Patient Details  Name: JIAIRE ROSEBROOK MRN: [id number] Date of Birth: 1951-01-30  Transition [facility]) CM/SW Contact  Reece Agar, [place] Phone Number: 05/20/2021, 10:21 AM  Clinical Narrative:    CSW completed screening checklist for pt VA benefits and faxed them to be reviewed at [phone]. CSW is waiting to hear back from [place] or case worker for placement and coverage from the [place].  Pt was approved for 30days with VA benefits, [facility] reviewed pt but decided not to accept bc of pt alcohol intake (6-8 beers a day). CSW reached back out to [facility] who willing to take pt on Friday. CSW will follow up with [facility] in the morning.   Pt shared with CSW that he also has a Insurance underwriter.   Expected Discharge Plan: [place] Barriers to Discharge: Continued Medical Work up  Expected Discharge Plan and Services Expected Discharge Plan: [place] In-house Referral: Clinical Social Work   Post Acute Care Choice: [place] Living arrangements for the past 2 months: [place] Determinants of Health (SDOH) Interventions    Readmission Risk Interventions Readmission Risk Prevention Plan 03/21/2019 03/21/2019  Transportation Screening - Complete  PCP or Specialist Appt within 3-5 Days - (No Data)  [place] or [place] - Complete  Social Work Consult for [place] Planning/Counseling - Patient refused  Palliative Care Screening - Not Applicable  Medication Review (RN Care Manager) Referral to Pharmacy Complete  Some recent data might be hidden

## 2021-05-20 NOTE — Progress Notes (Signed)
Mobility Specialist: Progress Note   05/20/21 1547  Mobility  Activity Ambulated in room  Level of Assistance Moderate assist, patient does 50-74%  Assistive Device Front wheel walker  Distance Ambulated (ft) 10 ft  Mobility Ambulated with assistance in room  Mobility Response Tolerated well  Mobility performed by Mobility specialist  Bed Position Chair  $Mobility charge 1 Mobility   Pre-Mobility: 59 HR, 96% SpO2 Post-Mobility: 72 HR, 96% SpO2  Pt required minA to sit EOB and modA to stand. Pt was able to ambulate to the door before needing to sit with c/o pain in his ankles. Pt sitting in the recliner with call bell and phone in reach.   [facility] Krystyl Cannell Mobility Specialist Mobility Specialist 4 [place]: [phone] Mobility Specialist 2 [place] and [place]: [phone]

## 2021-05-20 NOTE — Progress Notes (Signed)
Physical Therapy Treatment Patient Details Name: Francis Cardenas MRN: [id number] DOB: [birth year] Today's Date: 05/20/2021   History of Present Illness Pt is a 70 y.o. male admitted 05/13/21 with weight gain, SOB; workup for CHF exacerbation. Course complicated by fever and pain in multiple joints, suspect acute gout. PMH includes HTN, CHF, OSA on CPAP, obesity.   PT Comments    Pt progressing with mobility. Today's session focused on transfer training and standing/gait tolerance with RW; pt requiring consistent modA for mobility. Pt remains limited by generalized weakness, decreased activity tolerance, pain, and impaired balance strategies/postural reactions. Continue to recommend SNF-level therapies to maximize functional mobility and independence prior to return home; pt in agreement.    Recommendations for follow up therapy are one component of a multi-disciplinary discharge planning process, led by the attending physician.  Recommendations may be updated based on patient status, additional functional criteria and insurance authorization.  Follow Up Recommendations  Skilled nursing-short term rehab (<3 hours/day)     Assistance Recommended at Discharge Intermittent Supervision/Assistance  Equipment Recommendations   (defer to next venue)    Recommendations for Other Services       Precautions / Restrictions Precautions Precautions: Fall Restrictions Weight Bearing Restrictions: No     Mobility  Bed Mobility Overal bed mobility: Needs Assistance Bed Mobility: Supine to Sit     Supine to sit: HOB elevated;Min assist Sit to supine: Min assist   General bed mobility comments: Received sitting in recliner    Transfers Overall transfer level: Needs assistance Equipment used: Rolling walker (2 wheels) Transfers: Sit to/from Stand Sit to Stand: Mod assist Stand pivot transfers: Mod assist         General transfer comment: Cues for hand placement, reliant on momentum  and modA to power into standing from recliner to RW; heavy use of RUE support to push; unable to assist with LUE due to c/o elbow/wrist pain    Ambulation/Gait Ambulation/Gait assistance: Min assist Gait Distance (Feet): 8 Feet Assistive device: Rolling walker (2 wheels) Gait Pattern/deviations: Step-to pattern;Wide base of support;Trunk flexed;Antalgic Gait velocity: Decreased   Pre-gait activities: Tolerated initial ~15-sec marching in place with RW and minA, limited by c/o feet pain General Gait Details: Ambulating forwards/backwards on second bout of standing with RW and minA for stability; distance limited by c/o BLE pain   Stairs             Wheelchair Mobility    Modified Rankin (Stroke Patients Only)       Balance Overall balance assessment: Needs assistance Sitting-balance support: Feet supported Sitting balance-Leahy Scale: Fair Sitting balance - Comments: Able to maintain static sitting balance at EOB without external assist.   Standing balance support: During functional activity;Bilateral upper extremity supported;Reliant on assistive device for balance Standing balance-Leahy Scale: Poor Standing balance comment: Heavy reliance on UE support to maintain static standing                            Cognition Arousal/Alertness: Awake/alert Behavior During Therapy: WFL for tasks assessed/performed Overall Cognitive Status: Within Functional Limits for tasks assessed                                 General Comments: Loquacious, requires redirection to focus on current task/conversation        Exercises General Exercises - Lower Extremity Ankle Circles/Pumps: AROM;Both;Seated Long Arc Quad: AROM;Seated  General Comments General comments (skin integrity, edema, etc.): pt appears motivated by improvements in mobility and ability to stand/take steps; remains agreeable to post-acute rehab at SNF      Pertinent Vitals/Pain Pain  Assessment: Faces Pain Score: 8  Faces Pain Scale: Hurts even more Pain Location: multiple joints - especially L elbow/wrist, bilateral feet/ankles Pain Descriptors / Indicators: Discomfort;Grimacing;Guarding Pain Intervention(s): Monitored during session;Limited activity within patient's tolerance;Premedicated before session    Home Living                          Prior Function            PT Goals (current goals can now be found in the care plan section) Progress towards PT goals: Progressing toward goals    Frequency    Min 2X/week      PT Plan Current plan remains appropriate    Co-evaluation              AM-PAC PT "6 Clicks" Mobility   Outcome Measure  Help needed turning from your back to your side while in a flat bed without using bedrails?: A Lot Help needed moving from lying on your back to sitting on the side of a flat bed without using bedrails?: A Lot Help needed moving to and from a bed to a chair (including a wheelchair)?: A Lot Help needed standing up from a chair using your arms (e.g., wheelchair or bedside chair)?: A Lot Help needed to walk in hospital room?: Total Help needed climbing 3-5 steps with a railing? : Total 6 Click Score: 10    End of Session Equipment Utilized During Treatment: Gait belt Activity Tolerance: Patient tolerated treatment well Patient left: in chair;with call bell/phone within reach Nurse Communication: Mobility status PT Visit Diagnosis: Unsteadiness on feet (R26.81);Other abnormalities of gait and mobility (R26.89);Muscle weakness (generalized) (M62.81);Pain     Time: 8144-8185 PT Time Calculation (min) (ACUTE ONLY): 24 min  Charges:  $Therapeutic Exercise: 8-22 mins $Therapeutic Activity: 8-22 mins                     Mabeline Caras, PT, DPT Acute Rehabilitation Services  Pager [phone] Office [place] 05/20/2021, 10:47 AM

## 2021-05-20 NOTE — Progress Notes (Signed)
Occupational Therapy Treatment Patient Details Name: KAENAN JAKE MRN: [id number] DOB: [birth year] Today's Date: 05/20/2021   History of present illness Pt is a 70 y.o. male admitted 05/13/21 with weight gain, SOB; workup for CHF exacerbation. Course complicated by fever and pain in multiple joints, suspect acute gout. PMH includes HTN, CHF, OSA on CPAP, obesity.   OT comments  OT treatment session with focus on bed mobility, EOB activity tolerance, functional transfers and education on benefit of participation with therapy efforts. Patient progressed from supine to EOB with Min A and HOB elevated. Completed breakfast meal seated EOB reporting some difficulty managing utensils 2/2 pain at multiple sites. Patient still able to complete meal with only set-up assist. Patient initially declined transfer to recliner secondary to anticipation of pain. After completing breakfast meal seated EOB, patient in agreement with transfer to recliner via stand-pivot transfer with Mod A and cues for technique and hand placement. Patient making steady progress toward goals and would benefit from continued acute OT services. Will continue to follow acutely.   Recommendations for follow up therapy are one component of a multi-disciplinary discharge planning process, led by the attending physician.  Recommendations may be updated based on patient status, additional functional criteria and insurance authorization.    Follow Up Recommendations  Skilled nursing-short term rehab (<3 hours/day)    Assistance Recommended at Discharge Frequent or constant Supervision/Assistance  Equipment Recommendations  Other (comment) (defer to next venue)    Recommendations for Other Services      Precautions / Restrictions Precautions Precautions: Fall Restrictions Weight Bearing Restrictions: No       Mobility Bed Mobility Overal bed mobility: Needs Assistance Bed Mobility: Supine to Sit     Supine to sit: HOB  elevated;Min assist Sit to supine: Min assist   General bed mobility comments: Supine to EOB with Min A at trunk. Able to progress BLE toward EOB without external asssist.    Transfers Overall transfer level: Needs assistance Equipment used: None Transfers: Bed to chair/wheelchair/BSC   Stand pivot transfers: Mod assist         General transfer comment: Mod A for stand-pivot to recliner via face to face transfer. Cues for hand placement and technqiue.     Balance Overall balance assessment: Needs assistance Sitting-balance support: Feet supported Sitting balance-Leahy Scale: Fair Sitting balance - Comments: Able to maintain static sitting balance at EOB without external assist.   Standing balance support: During functional activity;Bilateral upper extremity supported Standing balance-Leahy Scale: Poor Standing balance comment: Relinat on external asssist to maintain static standing balance.                           ADL either performed or assessed with clinical judgement   ADL Overall ADL's : Needs assistance/impaired Eating/Feeding: Set up;Sitting   Grooming: Sitting;Set up                   Toilet Transfer: Squat-pivot;Minimal assistance Toilet Transfer Details (indicate cue type and reason): Simulated with stand-pivot to recliner with cues for hand placement and technique.                Extremity/Trunk Assessment              Vision       Perception     Praxis      Cognition Arousal/Alertness: Awake/alert Behavior During Therapy: WFL for tasks assessed/performed Overall Cognitive Status: Within Functional Limits for tasks assessed  General Comments: Loquacious          Exercises     Shoulder Instructions       General Comments HR 54bpm, SpO2 97% on RA. Patient requires increased coaxing/encouragement for participation with therapy efforts.    Pertinent Vitals/ Pain        Pain Assessment: 0-10 Pain Score: 8  Pain Location: multiple joints - especially L elbow/wrist, bilateral feet/ankles Pain Descriptors / Indicators: Discomfort;Grimacing;Guarding Pain Intervention(s): Limited activity within patient's tolerance;Monitored during session;Repositioned  Home Living                                          Prior Functioning/Environment              Frequency  Min 2X/week        Progress Toward Goals  OT Goals(current goals can now be found in the care plan section)  Progress towards OT goals: Progressing toward goals  Acute Rehab OT Goals Patient Stated Goal: To be able to walk again OT Goal Formulation: With patient Time For Goal Achievement: 05/29/21 Potential to Achieve Goals: Good ADL Goals Pt Will Perform Grooming: with set-up;standing Pt Will Perform Upper Body Bathing: with set-up;sitting Pt Will Perform Upper Body Dressing: with set-up;sitting Pt Will Perform Lower Body Dressing: with set-up;sit to/from stand;with adaptive equipment Pt Will Transfer to Toilet: with modified independence;regular height toilet;ambulating Pt/caregiver will Perform Home Exercise Program: Increased strength;Both right and left upper extremity;With theraband;With written HEP provided  Plan Discharge plan remains appropriate;Frequency remains appropriate    Co-evaluation    PT/OT/SLP Co-Evaluation/Treatment: Yes            AM-PAC OT "6 Clicks" Daily Activity     Outcome Measure   Help from another person eating meals?: None Help from another person taking care of personal grooming?: A Little Help from another person toileting, which includes using toliet, bedpan, or urinal?: A Little Help from another person bathing (including washing, rinsing, drying)?: A Lot Help from another person to put on and taking off regular upper body clothing?: A Little Help from another person to put on and taking off regular lower body clothing?:  A Lot 6 Click Score: 17    End of Session    OT Visit Diagnosis: Unsteadiness on feet (R26.81);Muscle weakness (generalized) (M62.81)   Activity Tolerance Patient limited by pain   Patient Left with call bell/phone within reach;in chair   Nurse Communication Mobility status        Time: 3785-8850 OT Time Calculation (min): 44 min  Charges: OT General Charges $OT Visit: 1 Visit OT Treatments $Self Care/Home Management : 8-22 mins $Therapeutic Activity: 23-37 mins  Derrious Bologna H. OTR/L Supplemental OT, Department of rehab services [phone]  Deeann Saint R [place] 05/20/2021, 7:57 AM

## 2021-05-20 NOTE — TOC Transition Note (Incomplete)
Transition of Care [facility]) - CM/SW Discharge Note   Patient Details  Name: JOSEPHINE RUDNICK MRN: [id number] Date of Birth: 03-Jun-1950  Transition of Care [facility]) CM/SW Contact:  Tresa Endo Phone Number: 05/20/2021, 2:23 PM   Clinical Narrative:    Patient will DC to: [facility] Anticipated DC date: 05/20/2021 Family notified: Pt  Transport by: Corey Harold   Per MD patient ready for DC to [place]. RN to call report prior to discharge [phone]). RN, patient, patient's family, and facility notified of DC. Discharge Summary and FL2 sent to facility. DC packet on chart. Ambulance transport requested for patient.   CSW will sign off for now as social work intervention is no longer needed. Please consult [place] again if new needs arise.     Final next level of care: Skilled Nursing Facility Barriers to Discharge: Continued Medical Work up   Patient Goals and CMS Choice Patient states their goals for this hospitalization and ongoing recovery are:: Rehab CMS Medicare.gov Compare Post Acute Care list provided to:: Patient Choice offered to / list presented to : Patient  Discharge Placement                       Discharge Plan and Services In-house Referral: Clinical Social Work   Post Acute Care Choice: [place]                               Social Determinants of Health (SDOH) Interventions     Readmission Risk Interventions Readmission Risk Prevention Plan 03/21/2019 03/21/2019  Transportation Screening - Complete  PCP or Specialist Appt within 3-5 Days - (No Data)  [place] or [place] - Complete  Social Work Consult for [place] Planning/Counseling - Patient refused  Palliative Care Screening - Not Applicable  Medication Review (RN Care Manager) Referral to Pharmacy Complete  Some recent data might be hidden

## 2021-05-21 LAB — GLUCOSE, CAPILLARY
Glucose-Capillary: 127 mg/dL — ABNORMAL HIGH (ref 70–99)
Glucose-Capillary: 157 mg/dL — ABNORMAL HIGH (ref 70–99)
Glucose-Capillary: 257 mg/dL — ABNORMAL HIGH (ref 70–99)
Glucose-Capillary: 203 mg/dL — ABNORMAL HIGH (ref 70–99)
Glucose-Capillary: 195 mg/dL — ABNORMAL HIGH (ref 70–99)

## 2021-05-21 MED ORDER — CARVEDILOL 3.125 MG PO TABS: 3.1250 mg | ORAL_TABLET | Freq: Two times a day (BID) | ORAL | Status: DC

## 2021-05-21 MED ORDER — CARVEDILOL 3.125 MG PO TABS
3.1250 mg | ORAL_TABLET | Freq: Two times a day (BID) | ORAL | Status: DC
  Administered 2021-05-21 – 2021-05-22 (×2): 3.125 mg via ORAL
  Filled 2021-05-21 (×3): qty 1

## 2021-05-21 NOTE — Discharge Summary (Addendum)
Physician Discharge Summary  ACHERON SUGG PXT:[id number] DOB: [birth year] DOA: 05/14/2021  PCP: Gerome Sam, MD  Admit date: 05/14/2021 Discharge date: 05/21/2021  Admitted From: Home Discharge disposition: SNF   Code Status: Full Code   Discharge Diagnosis:   Principal Problem:   Acute diastolic CHF (congestive heart failure) ([place]) Active Problems:   Benign essential hypertension   COPD (chronic obstructive pulmonary disease) ([place])   Diabetes mellitus type 2 in obese ([place])   Chest pain syndrome   CHF (congestive heart failure) (H[facility])   OSA on CPAP   Morbid obesity ([place])   Unspecified atrial fibrillation (H[facility])   Acute diastolic (congestive) heart failure ([place])   Pressure injury of skin    Chief Complaint  Patient presents with   Shortness of Breath    Brief narrative: CARRELL RAHMANI is a 70 y.o. male with PMH significant for morbid obesity, OSA on CPAP, OSA, HTN, chronic diastolic CHF and recently diagnosed A. fib today.  Patient reports progressive weight gain about 30 pounds in the last 2 months with insidious shortness of breath for which she presented to the ED on 12/23.  He was noted to be fluid overloaded.   Patient was started on IV diuretic, IV heparin and admitted to hospitalist service.  Subjective: Patient was seen and examined this morning.   Pleasant elderly Caucasian male.  Morbidly obese.  Lying on bed.  Not in distress.  He is happy that he was able to move a little bit with physical therapy today.   Was planned for discharge yesterday but insurance authorization and paperwork was pending.  Completed today by family.  Hospital course: Acute on chronic diastolic CHF Essential hypertension -Patient presented with 30 pound weight gain in the last 2 months despite reported compliance to diuretics.   -Echo on 12/16 at [facility] with EF 62%, RV systolic pressure 70-35 suggestive of mild to moderate pulm hypertension  -He was diuresed with IV Lasix.   Negative balance of more than 12 L since admission.   -Also on Coreg.  ACE inhibitor on hold -Net IO Since Admission: -12,244.84 mL [05/21/21 1404] -At discharge, I switched him to Lasix oral 40 mg twice daily.  Resumed lisinopril and Aldactone at lower doses. -Coreg dose was reduced to 3.15 mg twice daily because of bradycardia. Recent Labs  Lab 05/16/21 0030 05/17/21 0145 05/18/21 0043 05/19/21 0115 05/20/21 0510  BUN 20 22 26* 33* 36*  CREATININE 1.39* 1.36* 1.38* 1.22 0.98  K 4.2 3.9 4.3 4.2 4.0  MG  --   --   --  2.4  --    A. Fib Sinus bradycardia -Recently diagnosed as an outpatient by PCP.  Has Zio patch in place -Currently he has sinus bradycardia down to 40s.  I reduced carvedilol dose from 12.5 mg daily to 3.125 mg twice daily today. -CHADSVASC score 2.  Initially started on heparin drip.  Later transitioned to Eliquis. -Follow-up with cardiology at [place] after discharge.   Acute gout -Complains of fever, polyarthralgia, elevated uric acid level to 11.8.   -Started on steroids and colchicine with improvement in joint pain and fever  -Currently on colchicine 0.6 mg daily and prednisone 40 daily.  3 more days of prednisone post discharge.  Colchicine to continue.  Enterococcus UTI -Currently on a 5-day course of oral Augmentin.    Chronic kidney disease stage IIIa -Creatinine was 1.57 at the time of admission.  -Renal function is stable.   Hyponatremia  -Sodium level  between 130 and 135. Recent Labs  Lab 05/15/21 0329 05/16/21 0030 05/17/21 0145 05/18/21 0043 05/19/21 0115 05/20/21 0510  NA 134* 134* 134* 129* 131* 132*   History of COPD -Stable. Continue nebulizer treatments as needed.  Saturating normal on room air.  No wheezing appreciated.   Type 2 diabetes mellitus with hyperglycemia -A1c 5.5 on 05/15/2021 -Diet controlled at home.  Currently blood sugar elevated because of steroids. -Currently on sliding scale insulin with  Accu-Cheks. Recent Labs  Lab 05/20/21 1652 05/20/21 2114 05/21/21 0608 05/21/21 0908 05/21/21 1237  GLUCAP 261* 219* 127* 157* 257*   Chronic alcohol abuse -Reportedly, patient was drinking 8 cans of beers on a daily basis.   -No withdrawal symptoms in the hospital.   -Continue thiamine multivitamins.     Normocytic anemia -Stable hemoglobin between 9-10 without evidence of iron, folate or vitamin B12 deficiency Recent Labs    05/15/21 0329 05/16/21 0030 05/17/21 0145 05/18/21 0043 05/20/21 0510  HGB 10.2* 9.5* 9.5* 9.4* 10.1*  MCV 94.6 96.8 95.1 93.7 92.4  VITAMINB12 470  --   --   --   --   FOLATE 26.5  --   --   --   --   FERRITIN 79  --   --   --   --   TIBC 423  --   --   --   --   IRON 26*  --   --   --   --   RETICCTPCT 2.9  --   --   --   --    Obstructive sleep apnea Continue CPAP   Morbid obesity  -Body mass index is 40.62 kg/m. Patient has been advised to make an attempt to improve diet and exercise patterns to aid in weight loss.  Mobility: Encourage ambulation Living condition: Was living at home with wife Goals of care:   Code Status: Full Code  Nutritional status: Body mass index is 40.62 kg/m.      Discharge Medications:   Allergies as of 05/21/2021       Reactions   Oxcarbazepine Other (See Comments)   Dizziness/ lightheaded   Zolpidem Nausea Only        Medication List     STOP taking these medications    amLODipine 10 MG tablet Commonly known as: NORVASC   aspirin EC 81 MG tablet   budesonide-formoterol 80-4.5 MCG/ACT inhaler Commonly known as: SYMBICORT Replaced by: mometasone-formoterol 100-5 MCG/ACT Aero   hydrALAZINE 25 MG tablet Commonly known as: APRESOLINE   meloxicam 15 MG tablet Commonly known as: MOBIC   torsemide 20 MG tablet Commonly known as: DEMADEX   Wixela Inhub 100-50 MCG/ACT Aepb Generic drug: fluticasone-salmeterol       TAKE these medications    albuterol 108 (90 Base) MCG/ACT  inhaler Commonly known as: VENTOLIN HFA Inhale 2 puffs into the lungs every 6 (six) hours as needed for wheezing.   ALPRAZolam 1 MG tablet Commonly known as: XANAX Take 1 tablet (1 mg total) by mouth at bedtime for 5 days.   amoxicillin-clavulanate 875-125 MG tablet Commonly known as: AUGMENTIN Take 1 tablet by mouth every 12 (twelve) hours for 3 days.   apixaban 5 MG Tabs tablet Commonly known as: ELIQUIS Take 1 tablet (5 mg total) by mouth 2 (two) times daily.   atorvastatin 80 MG tablet Commonly known as: LIPITOR Take 40 mg by mouth daily with supper.   Carboxymethylcellulose Sodium 0.25 % Soln Place 1 drop into both eyes  3 (three) times daily as needed (dry eyes/ irritation).   carvedilol 3.125 MG tablet Commonly known as: COREG Take 1 tablet (3.125 mg total) by mouth 2 (two) times daily with a meal. What changed:  medication strength how much to take additional instructions   colchicine 0.6 MG tablet Take 1 tablet (0.6 mg total) by mouth daily.   diclofenac Sodium 1 % Gel Commonly known as: VOLTAREN Apply 2 g topically 4 (four) times daily.   Ensure Max Protein Liqd Take 330 mLs (11 oz total) by mouth 2 (two) times daily. What changed:  when to take this reasons to take this   folic acid 1 MG tablet Commonly known as: FOLVITE Take 1 tablet (1 mg total) by mouth daily.   furosemide 40 MG tablet Commonly known as: Lasix Take 1 tablet (40 mg total) by mouth 2 (two) times daily.   gabapentin 300 MG capsule Commonly known as: NEURONTIN Take 300 mg by mouth 3 (three) times daily.   guaifenesin 400 MG Tabs tablet Commonly known as: HUMIBID E Take 400 mg by mouth every morning.   insulin aspart 100 UNIT/ML injection Commonly known as: novoLOG Inject 0-15 Units into the skin 3 (three) times daily with meals.   insulin aspart 100 UNIT/ML injection Commonly known as: novoLOG Inject 0-5 Units into the skin at bedtime.   lisinopril 10 MG tablet Commonly  known as: ZESTRIL Take 1 tablet (10 mg total) by mouth every morning. Hold for SBP <100 What changed:  medication strength how much to take   Magnesium Oxide 420 MG Tabs Take 420 mg by mouth daily with supper.   melatonin 3 MG Tabs tablet Take 1 tablet (3 mg total) by mouth at bedtime as needed.   mometasone-formoterol 100-5 MCG/ACT Aero Commonly known as: DULERA Inhale 2 puffs into the lungs 2 (two) times daily. Replaces: budesonide-formoterol 80-4.5 MCG/ACT inhaler   multivitamin with minerals tablet Take 1 tablet by mouth daily with lunch.   omeprazole 20 MG capsule Commonly known as: PRILOSEC Take 20 mg by mouth daily with supper.   potassium chloride SA 20 MEQ tablet Commonly known as: KLOR-CON M Take 1 tablet (20 mEq total) by mouth daily with supper. What changed:  how much to take when to take this   predniSONE 20 MG tablet Commonly known as: DELTASONE Take 2 tablets (40 mg total) by mouth daily with breakfast for 3 days.   PRESCRIPTION MEDICATION Inhale into the lungs at bedtime. CPAP   spironolactone 25 MG tablet Commonly known as: ALDACTONE Take 25 mg by mouth every morning.   thiamine 100 MG tablet Take 1 tablet (100 mg total) by mouth daily. What changed: additional instructions   vitamin B-12 500 MCG tablet Commonly known as: CYANOCOBALAMIN Take 500 mcg by mouth daily with supper.   VITAMIN D3 PO Take 1 tablet by mouth daily with supper.               Discharge Care Instructions  (From admission, onward)           Start     Ordered   05/20/21 0000  Discharge wound care:        05/20/21 1420            Wound care:   Pressure Injury 05/15/21 Buttocks Left Stage 2 -  Partial thickness loss of dermis presenting as a shallow open injury with a red, pink wound bed without slough. bruising around the site vs. deep tissue injury (Active)  Date First Assessed/Time First Assessed: 05/15/21 0300   Location: Buttocks  Location  Orientation: Left  Staging: Stage 2 -  Partial thickness loss of dermis presenting as a shallow open injury with a red, pink wound bed without slough.  Wound Descrip...    Assessments 05/15/2021  3:00 AM 05/20/2021  7:59 PM  Dressing Type Foam - Lift dressing to assess site every shift Foam - Lift dressing to assess site every shift  Dressing Clean;Dry;Intact Clean;Dry;Intact  Dressing Change Frequency Every 3 days --  State of Healing Early/partial granulation --  Site / Wound Assessment Clean;Dry;Painful;Red;Pink;Granulation tissue Clean;Dry  % Wound base Red or Granulating 100% --  Peri-wound Assessment Erythema (non-blanchable);Intact;Purple --  Wound Length (cm) 2.5 cm --  Wound Width (cm) 2.5 cm --  Wound Depth (cm) 0.2 cm --  Wound Surface Area (cm^2) 6.25 cm^2 --  Wound Volume (cm^3) 1.25 cm^3 --  Margins -- Unattached edges (unapproximated)  Drainage Amount Scant None  Drainage Description Sanguineous --     No Linked orders to display    Discharge Instructions:   Discharge Instructions     Call MD for:  difficulty breathing, headache or visual disturbances   Complete by: As directed    Call MD for:  extreme fatigue   Complete by: As directed    Call MD for:  hives   Complete by: As directed    Call MD for:  persistant dizziness or light-headedness   Complete by: As directed    Call MD for:  persistant nausea and vomiting   Complete by: As directed    Call MD for:  severe uncontrolled pain   Complete by: As directed    Call MD for:  temperature >100.4   Complete by: As directed    Diet - low sodium heart healthy   Complete by: As directed    Diet Carb Modified   Complete by: As directed    Discharge instructions   Complete by: As directed    Discharge instructions for diabetes mellitus: Check blood sugar 3 times a day and bedtime at home. If blood sugar running above 200 or less than 70 please call your MD to adjust insulin. If you notice signs and symptoms  of hypoglycemia (low blood sugar) like jitteriness, confusion, thirst, tremor and sweating, please check blood sugar, drink sugary drink/biscuits/sweets to increase sugar level and call MD or return to ER.    Discharge instructions for CHF Check weight daily -preferably same time every day. Restrict fluid intake to 1200 ml daily Restrict salt intake to less than 2 g daily. Call MD if you have one of the following symptoms 1) 3 pound weight gain in 24 hours or 5 pounds in 1 week  2) swelling in the hands, feet or stomach  3) progressive shortness of breath 4) if you have to sleep on extra pillows at night in order to breathe     General discharge instructions:  Follow with Primary MD Gerome Sam, MD in 7 days   Get CBC/BMP checked in next visit within 1 week by PCP or SNF MD. (We routinely change or add medications that can affect your baseline labs and fluid status, therefore we recommend that you get the mentioned basic workup next visit with your PCP, your PCP may decide not to get them or add new tests based on their clinical decision)  On your next visit with your PCP, please get your medicines reviewed and adjusted.  Please request your PCP  to go over all hospital tests, procedures, radiology results at the follow up, please get all Hospital records sent to your PCP by signing hospital release before you go home.  Activity: As tolerated with Full fall precautions use walker/cane & assistance as needed  Avoid using any recreational substances like cigarette, tobacco, alcohol, or non-prescribed drug.  If you experience worsening of your admission symptoms, develop shortness of breath, life threatening emergency, suicidal or homicidal thoughts you must seek medical attention immediately by calling 911 or calling your MD immediately  if symptoms less severe.  You must read complete instructions/literature along with all the possible adverse reactions/side effects for  all the medicines you take and that have been prescribed to you. Take any new medicine only after you have completely understood and accepted all the possible adverse reactions/side effects.   Do not drive, operate heavy machinery, perform activities at heights, swimming or participation in water activities or provide baby sitting services if your were admitted for syncope or siezures until you have seen by Primary MD or a Neurologist and advised to do so again.  Do not drive when taking Pain medications.  Do not take more than prescribed Pain, Sleep and Anxiety Medications  Wear Seat belts while driving.  Please note You were cared for by a hospitalist during your hospital stay. If you have any questions about your discharge medications or the care you received while you were in the hospital after you are discharged, you can call the unit and asked to speak with the hospitalist on call if the hospitalist that took care of you is not available. Once you are discharged, your primary care physician will handle any further medical issues. Please note that NO REFILLS for any discharge medications will be authorized once you are discharged, as it is imperative that you return to your primary care physician (or establish a relationship with a primary care physician if you do not have one) for your aftercare needs so that they can reassess your need for medications and monitor your lab values.   Discharge wound care:   Complete by: As directed    Increase activity slowly   Complete by: As directed        Follow ups:    Follow-up Information     Gerome Sam, MD Follow up.   Specialty: Internal Medicine Contact information: [place] [address] [phone]         Sueanne Margarita, MD .   Specialty: Cardiology Contact information: [address] [place] [address] [phone]                 Discharge Exam:   Vitals:    05/21/21 0355 05/21/21 0500 05/21/21 0742 05/21/21 0754  BP: (!) 142/71   (!) 121/53  Pulse: (!) 48  (!) 53 (!) 54  Resp: 14  16 16   Temp: 98.5 F (36.9 C)   98.3 F (36.8 C)  TempSrc: Oral   Oral  SpO2: 98%  98% 98%  Weight:  128.4 kg    Height:        Body mass index is 40.62 kg/m.  General exam: Pleasant, elderly Caucasian male.  Not in physical distress.  Morbid obesity Skin: No rashes, lesions or ulcers. HEENT: Atraumatic, normocephalic, no obvious bleeding Lungs: Clear to auscultation bilaterally CVS: Slow heart rate, regular rhythm, no murmur GI/Abd soft, nontender, nondistended, bowel sound present CNS: Alert, awake, oriented x3 Psychiatry: Mood appropriate  Extremities: no pedal edema, no calf tenderness  Time coordinating discharge: 35 minutes   The results of significant diagnostics from this hospitalization (including imaging, microbiology, ancillary and laboratory) are listed below for reference.    Procedures and Diagnostic Studies:   DG CHEST PORT 1 VIEW  Result Date: 05/15/2021 CLINICAL DATA:  Fevers EXAM: PORTABLE CHEST 1 VIEW COMPARISON:  Film from the previous day. FINDINGS: Cardiac shadow is stable. Lungs are well aerated bilaterally. No focal infiltrate or sizable effusion is seen. No bony abnormality is noted. Previously seen vascular congestion has resolved. IMPRESSION: Resolution of previously seen vascular congestion. No acute abnormality noted. Electronically Signed   By: Inez Catalina M.D.   On: 05/15/2021 21:15   DG Chest Port 1 View  Result Date: 05/14/2021 CLINICAL DATA:  Provided history: Shortness of breath. EXAM: PORTABLE CHEST 1 VIEW COMPARISON:  Prior chest radiographs 12/16/2019 and earlier. FINDINGS: An electronic device projects over the left upper chest. Heart size within normal limits. Central pulmonary vascular congestion without overt pulmonary edema. No appreciable airspace consolidation. No evidence of pleural effusion or  pneumothorax. No acute bony abnormality identified. IMPRESSION: Central pulmonary vascular congestion without overt pulmonary edema. No appreciable airspace consolidation. Electronically Signed   By: Kellie Simmering D.O.   On: 05/14/2021 08:07     Labs:   Basic Metabolic Panel: Recent Labs  Lab 05/16/21 0030 05/17/21 0145 05/18/21 0043 05/19/21 0115 05/20/21 0510  NA 134* 134* 129* 131* 132*  K 4.2 3.9 4.3 4.2 4.0  CL 100 100 95* 94* 98  CO2 25 24 26 27 26   GLUCOSE 124* 126* 210* 222* 175*  BUN 20 22 26* 33* 36*  CREATININE 1.39* 1.36* 1.38* 1.22 0.98  CALCIUM 9.9 9.9 10.1 10.0 10.1  MG  --   --   --  2.4  --    GFR Estimated Creatinine Clearance: 91.7 mL/min (by C-G formula based on SCr of 0.98 mg/dL). Liver Function Tests: Recent Labs  Lab 05/15/21 2045  AST 14*  ALT 15  ALKPHOS 58  BILITOT 0.9  PROT 6.5  ALBUMIN 3.6   No results for input(s): LIPASE, AMYLASE in the last 168 hours. No results for input(s): AMMONIA in the last 168 hours. Coagulation profile No results for input(s): INR, PROTIME in the last 168 hours.  CBC: Recent Labs  Lab 05/15/21 0329 05/16/21 0030 05/17/21 0145 05/18/21 0043 05/20/21 0510  WBC 7.9 8.3 11.6* 10.0 8.5  NEUTROABS  --   --   --   --  6.3  HGB 10.2* 9.5* 9.5* 9.4* 10.1*  HCT 31.4* 29.9* 29.1* 28.1* 30.6*  MCV 94.6 96.8 95.1 93.7 92.4  PLT 202 181 174 184 261   Cardiac Enzymes: No results for input(s): CKTOTAL, CKMB, CKMBINDEX, TROPONINI in the last 168 hours. BNP: Invalid input(s): POCBNP CBG: Recent Labs  Lab 05/20/21 1652 05/20/21 2114 05/21/21 0608 05/21/21 0908 05/21/21 1237  GLUCAP 261* 219* 127* 157* 257*   D-Dimer No results for input(s): DDIMER in the last 72 hours. Hgb A1c No results for input(s): HGBA1C in the last 72 hours. Lipid Profile No results for input(s): CHOL, HDL, LDLCALC, TRIG, CHOLHDL, LDLDIRECT in the last 72 hours. Thyroid function studies No results for input(s): TSH, T4TOTAL, T3FREE,  THYROIDAB in the last 72 hours.  Invalid input(s): FREET3 Anemia work up No results for input(s): VITAMINB12, FOLATE, FERRITIN, TIBC, IRON, RETICCTPCT in the last 72 hours. Microbiology Recent Results (from the past 240 hour(s))  Resp Panel by RT-PCR (Flu  A&B, Covid) Nasopharyngeal Swab     Status: None   Collection Time: 05/14/21  1:46 PM   Specimen: Nasopharyngeal Swab; Nasopharyngeal(NP) swabs in vial transport medium  Result Value Ref Range Status   SARS Coronavirus 2 by RT PCR NEGATIVE NEGATIVE Final    Comment: (NOTE) SARS-CoV-2 target nucleic acids are NOT DETECTED.  The SARS-CoV-2 RNA is generally detectable in upper respiratory specimens during the acute phase of infection. The lowest concentration of SARS-CoV-2 viral copies this assay can detect is 138 copies/mL. A negative result does not preclude SARS-Cov-2 infection and should not be used as the sole basis for treatment or other patient management decisions. A negative result may occur with  improper specimen collection/handling, submission of specimen other than nasopharyngeal swab, presence of viral mutation(s) within the areas targeted by this assay, and inadequate number of viral copies(<138 copies/mL). A negative result must be combined with clinical observations, patient history, and epidemiological information. The expected result is Negative.  Fact Sheet for Patients:  EntrepreneurPulse.com.au  Fact Sheet for Healthcare Providers:  IncredibleEmployment.be  This test is no t yet approved or cleared by the [place] FDA and  has been authorized for detection and/or diagnosis of SARS-CoV-2 by FDA under an Emergency Use Authorization (EUA). This EUA will remain  in effect (meaning this test can be used) for the duration of the COVID-19 declaration under Section 564(b)(1) of the Act, 21 U.S.C.section 360bbb-3(b)(1), unless the authorization is terminated  or revoked  sooner.       Influenza A by PCR NEGATIVE NEGATIVE Final   Influenza B by PCR NEGATIVE NEGATIVE Final    Comment: (NOTE) The Xpert Xpress SARS-CoV-2/FLU/RSV plus assay is intended as an aid in the diagnosis of influenza from Nasopharyngeal swab specimens and should not be used as a sole basis for treatment. Nasal washings and aspirates are unacceptable for Xpert Xpress SARS-CoV-2/FLU/RSV testing.  Fact Sheet for Patients: EntrepreneurPulse.com.au  Fact Sheet for Healthcare Providers: IncredibleEmployment.be  This test is not yet approved or cleared by the [place] FDA and has been authorized for detection and/or diagnosis of SARS-CoV-2 by FDA under an Emergency Use Authorization (EUA). This EUA will remain in effect (meaning this test can be used) for the duration of the COVID-19 declaration under Section 564(b)(1) of the Act, 21 U.S.C. section 360bbb-3(b)(1), unless the authorization is terminated or revoked.  Performed at [facility], [address] [place], [place] [address]   MRSA Next Gen by PCR, Nasal     Status: None   Collection Time: 05/14/21  4:50 PM   Specimen: Nasal Mucosa; Nasal Swab  Result Value Ref Range Status   MRSA by PCR Next Gen NOT DETECTED NOT DETECTED Final    Comment: (NOTE) The GeneXpert MRSA Assay (FDA approved for NASAL specimens only), is one component of a comprehensive MRSA colonization surveillance program. It is not intended to diagnose MRSA infection nor to guide or monitor treatment for MRSA infections. Test performance is not FDA approved in patients less than 34 years old. Performed at [facility], [place] [address] [place], [place] [address]   Culture, blood (routine x 2)     Status: None   Collection Time: 05/15/21  8:45 PM   Specimen: BLOOD RIGHT HAND  Result Value Ref Range Status   Specimen Description BLOOD RIGHT HAND  Final   Special Requests AEROBIC BOTTLE  ONLY Blood Culture adequate volume  Final   Culture   Final    NO GROWTH  5 DAYS Performed at [facility], [place] [address], [place], [place] [address]    Report Status 05/20/2021 FINAL  Final  Culture, blood (routine x 2)     Status: None   Collection Time: 05/15/21  8:45 PM   Specimen: BLOOD RIGHT HAND  Result Value Ref Range Status   Specimen Description BLOOD RIGHT HAND  Final   Special Requests AEROBIC BOTTLE ONLY Blood Culture adequate volume  Final   Culture   Final    NO GROWTH 5 DAYS Performed at [facility], [place] [address] [place], [place] [address]    Report Status 05/20/2021 FINAL  Final  Urine Culture     Status: Abnormal   Collection Time: 05/15/21 10:04 PM   Specimen: Urine, Clean Catch  Result Value Ref Range Status   Specimen Description URINE, CLEAN CATCH  Final   Special Requests   Final    NONE Performed at [facility], [place] [address], [place], [place] [address]    Culture (A)  Final    >=100,000 COLONIES/mL ENTEROCOCCUS FAECALIS 80,000 COLONIES/mL STAPHYLOCOCCUS HAEMOLYTICUS    Report Status 05/19/2021 FINAL  Final   Organism ID, Bacteria ENTEROCOCCUS FAECALIS (A)  Final   Organism ID, Bacteria STAPHYLOCOCCUS HAEMOLYTICUS (A)  Final      Susceptibility   Enterococcus faecalis - MIC*    AMPICILLIN <=2 SENSITIVE Sensitive     NITROFURANTOIN <=16 SENSITIVE Sensitive     VANCOMYCIN 2 SENSITIVE Sensitive     * >=100,000 COLONIES/mL ENTEROCOCCUS FAECALIS   Staphylococcus haemolyticus - MIC*    CIPROFLOXACIN <=0.5 SENSITIVE Sensitive     GENTAMICIN <=0.5 SENSITIVE Sensitive     NITROFURANTOIN 32 SENSITIVE Sensitive     OXACILLIN <=0.25 SENSITIVE Sensitive     TETRACYCLINE <=1 SENSITIVE Sensitive     VANCOMYCIN <=0.5 SENSITIVE Sensitive     TRIMETH/SULFA <=10 SENSITIVE Sensitive     CLINDAMYCIN RESISTANT Resistant     RIFAMPIN <=0.5 SENSITIVE Sensitive     Inducible Clindamycin POSITIVE Resistant     * 80,000 COLONIES/mL STAPHYLOCOCCUS  HAEMOLYTICUS  Resp Panel by RT-PCR (Flu A&B, Covid) Nasopharyngeal Swab     Status: None   Collection Time: 05/16/21  8:00 PM   Specimen: Nasopharyngeal Swab; Nasopharyngeal(NP) swabs in vial transport medium  Result Value Ref Range Status   SARS Coronavirus 2 by RT PCR NEGATIVE NEGATIVE Final    Comment: (NOTE) SARS-CoV-2 target nucleic acids are NOT DETECTED.  The SARS-CoV-2 RNA is generally detectable in upper respiratory specimens during the acute phase of infection. The lowest concentration of SARS-CoV-2 viral copies this assay can detect is 138 copies/mL. A negative result does not preclude SARS-Cov-2 infection and should not be used as the sole basis for treatment or other patient management decisions. A negative result may occur with  improper specimen collection/handling, submission of specimen other than nasopharyngeal swab, presence of viral mutation(s) within the areas targeted by this assay, and inadequate number of viral copies(<138 copies/mL). A negative result must be combined with clinical observations, patient history, and epidemiological information. The expected result is Negative.  Fact Sheet for Patients:  EntrepreneurPulse.com.au  Fact Sheet for Healthcare Providers:  IncredibleEmployment.be  This test is no t yet approved or cleared by the [place] FDA and  has been authorized for detection and/or diagnosis of SARS-CoV-2 by FDA under an Emergency Use Authorization (EUA). This EUA will remain  in effect (meaning this test can be used) for the duration of the COVID-19  declaration under Section 564(b)(1) of the Act, 21 U.S.C.section 360bbb-3(b)(1), unless the authorization is terminated  or revoked sooner.       Influenza A by PCR NEGATIVE NEGATIVE Final   Influenza B by PCR NEGATIVE NEGATIVE Final    Comment: (NOTE) The Xpert Xpress SARS-CoV-2/FLU/RSV plus assay is intended as an aid in the diagnosis of influenza  from Nasopharyngeal swab specimens and should not be used as a sole basis for treatment. Nasal washings and aspirates are unacceptable for Xpert Xpress SARS-CoV-2/FLU/RSV testing.  Fact Sheet for Patients: EntrepreneurPulse.com.au  Fact Sheet for Healthcare Providers: IncredibleEmployment.be  This test is not yet approved or cleared by the [place] FDA and has been authorized for detection and/or diagnosis of SARS-CoV-2 by FDA under an Emergency Use Authorization (EUA). This EUA will remain in effect (meaning this test can be used) for the duration of the COVID-19 declaration under Section 564(b)(1) of the Act, 21 U.S.C. section 360bbb-3(b)(1), unless the authorization is terminated or revoked.  Performed at [facility], [place] [address], [place], [place] [address]   Resp Panel by RT-PCR (Flu A&B, Covid) Nasopharyngeal Swab     Status: None   Collection Time: 05/20/21  2:56 PM   Specimen: Nasopharyngeal Swab; Nasopharyngeal(NP) swabs in vial transport medium  Result Value Ref Range Status   SARS Coronavirus 2 by RT PCR NEGATIVE NEGATIVE Final    Comment: (NOTE) SARS-CoV-2 target nucleic acids are NOT DETECTED.  The SARS-CoV-2 RNA is generally detectable in upper respiratory specimens during the acute phase of infection. The lowest concentration of SARS-CoV-2 viral copies this assay can detect is 138 copies/mL. A negative result does not preclude SARS-Cov-2 infection and should not be used as the sole basis for treatment or other patient management decisions. A negative result may occur with  improper specimen collection/handling, submission of specimen other than nasopharyngeal swab, presence of viral mutation(s) within the areas targeted by this assay, and inadequate number of viral copies(<138 copies/mL). A negative result must be combined with clinical observations, patient history, and epidemiological information. The expected  result is Negative.  Fact Sheet for Patients:  EntrepreneurPulse.com.au  Fact Sheet for Healthcare Providers:  IncredibleEmployment.be  This test is no t yet approved or cleared by the [place] FDA and  has been authorized for detection and/or diagnosis of SARS-CoV-2 by FDA under an Emergency Use Authorization (EUA). This EUA will remain  in effect (meaning this test can be used) for the duration of the COVID-19 declaration under Section 564(b)(1) of the Act, 21 U.S.C.section 360bbb-3(b)(1), unless the authorization is terminated  or revoked sooner.       Influenza A by PCR NEGATIVE NEGATIVE Final   Influenza B by PCR NEGATIVE NEGATIVE Final    Comment: (NOTE) The Xpert Xpress SARS-CoV-2/FLU/RSV plus assay is intended as an aid in the diagnosis of influenza from Nasopharyngeal swab specimens and should not be used as a sole basis for treatment. Nasal washings and aspirates are unacceptable for Xpert Xpress SARS-CoV-2/FLU/RSV testing.  Fact Sheet for Patients: EntrepreneurPulse.com.au  Fact Sheet for Healthcare Providers: IncredibleEmployment.be  This test is not yet approved or cleared by the [place] FDA and has been authorized for detection and/or diagnosis of SARS-CoV-2 by FDA under an Emergency Use Authorization (EUA). This EUA will remain in effect (meaning this test can be used) for the duration of the COVID-19 declaration under Section 564(b)(1) of the Act, 21 U.S.C. section 360bbb-3(b)(1), unless the authorization is terminated or revoked.  Performed at [facility]  [facility], [place] [address], [place], [place] [address]      Signed: Terrilee Croak  [facility] 05/21/2021, 2:04 PM

## 2021-05-21 NOTE — TOC Transition Note (Addendum)
Transition of Care [facility]) - CM/SW Discharge Note   Patient Details  Name: Francis Cardenas MRN: [id number] Date of Birth: 1950/11/29  [facility]) CM/SW Contact:  Bethann Berkshire, [place] Phone Number: 05/21/2021, 12:18 PM   Clinical Narrative:     CSW contacted [facility] and confirmed they can accept today. Pt family member will need to meet at [facility] at 2pm to complete paperwork. [facility] requesting auth information for pt. CSW called and left message with pt's Education officer, museum. CSW updated pt and wife; wife will meet at [facility] at 2pm to complete paperwork. CSW will provide updates once PTAR is scheduled.   Called and left message with [place] coordinator requesting auth info. Emailed Lemar Lofty requesting auth info.   1300: CSW received call from [place]; she needs to confirm with pt. CSW allowed pt to use his phone to speak with Carmella. She confirmed with him choice of [facility]. She stated she would email Amado Coe with [facility] auth information. Pt's wife will be meeting at [facility] at 2pm to complete paperwork.   Patient will DC to: [facility] Anticipated DC date:05/21/21 Family notified: Pt wife Francis Cardenas Transport by: Corey Harold   Per MD patient ready for DC to [facility]. RN, patient, patient's family, and facility notified of DC. Discharge Summary and FL2 sent to facility. RN to call report prior to discharge [phone] Room 3213). DC packet on chart. Ambulance transport requested for patient.   CSW will sign off for now as social work intervention is no longer needed. Please consult [place] again if new needs arise.   Final next level of care: Skilled Nursing Facility Barriers to Discharge: No Barriers Identified   Patient Goals and CMS Choice Patient states their goals for this hospitalization and ongoing recovery are:: Rehab CMS Medicare.gov Compare Post Acute Care list provided to:: Patient Choice offered to / list presented to :  Patient  Discharge Placement                Patient to be transferred to facility by: [place] Name of family member notified: Francis Cardenas, Francis Cardenas (Spouse)   [phone] (Mobile) Patient and family notified of of transfer: 05/21/21  Discharge Plan and Services In-house Referral: Clinical Social Work   Post Acute Care Choice: [place]                               Social Determinants of Health (SDOH) Interventions     Readmission Risk Interventions Readmission Risk Prevention Plan 03/21/2019 03/21/2019  Transportation Screening - Complete  PCP or Specialist Appt within 3-5 Days - (No Data)  [place] or [place] - Complete  Social Work Consult for [place] Planning/Counseling - Patient refused  Palliative Care Screening - Not Applicable  Medication Review (RN Care Manager) Referral to Pharmacy Complete  Some recent data might be hidden

## 2021-05-21 NOTE — TOC Progression Note (Incomplete)
Transition of Care [facility]) - Progression Note    Patient Details  Name: Francis Cardenas MRN: [id number] Date of Birth: 03/03/1951  Transition of Care [facility]) CM/SW [place], [place] Phone Number: 05/21/2021, 9:16 AM  Clinical Narrative:     CSW contacted Amado Coe with [facility] about bed for pt today; she stated she would update CSW after their bed meeting this morning.   Expected Discharge Plan: [place] Barriers to Discharge: Continued Medical Work up  Expected Discharge Plan and Services Expected Discharge Plan: [place] In-house Referral: Clinical Social Work   Post Acute Care Choice: [place] Living arrangements for the past 2 months: Single Family Home Expected Discharge Date: 05/20/21                                     Social Determinants of Health (SDOH) Interventions    Readmission Risk Interventions Readmission Risk Prevention Plan 03/21/2019 03/21/2019  Transportation Screening - Complete  PCP or Specialist Appt within 3-5 Days - (No Data)  [place] or [place] - Complete  Social Work Consult for [place] Planning/Counseling - Patient refused  Palliative Care Screening - Not Applicable  Medication Review (RN Care Manager) Referral to Pharmacy Complete  Some recent data might be hidden

## 2021-05-22 LAB — GLUCOSE, CAPILLARY
Glucose-Capillary: 127 mg/dL — ABNORMAL HIGH (ref 70–99)
Glucose-Capillary: 190 mg/dL — ABNORMAL HIGH (ref 70–99)

## 2021-05-22 MED ORDER — FUROSEMIDE 40 MG PO TABS
40.0000 mg | ORAL_TABLET | Freq: Once | ORAL | Status: AC
  Administered 2021-05-22: 40 mg via ORAL
  Filled 2021-05-22: qty 1

## 2021-05-22 NOTE — Plan of Care (Signed)

## 2021-05-22 NOTE — TOC Transition Note (Signed)
Transition of Care [facility]) - CM/SW Discharge Note   Patient Details  Name: Francis Cardenas MRN: [id number] Date of Birth: 08-Sep-1950  Transition of Care [facility]) CM/SW Contact:  Bary Castilla, LCSW Phone Number: [phone] 05/22/2021, 10:23 AM   Clinical Narrative:     CSW received a return call form Wendi from [place] and she stated that pt can be DC today. They received everything therefore just need to do the call to report again.  Per MD patient ready for DC to [facility]. RN, patient, patient's family, and facility notified of DC. Discharge Summary and FL2 sent to facility. RN to call report prior to discharge [phone] Room 3213). DC packet on chart. Ambulance transport requested for patient.    Final next level of care: Skilled Nursing Facility Barriers to Discharge: No Barriers Identified   Patient Goals and CMS Choice Patient states their goals for this hospitalization and ongoing recovery are:: Rehab CMS Medicare.gov Compare Post Acute Care list provided to:: Patient Choice offered to / list presented to : Patient  Discharge Placement                Patient to be transferred to facility by: [place] Name of family member notified: Francis Cardenas, Francis Cardenas (Spouse)   [phone] (Mobile) Patient and family notified of of transfer: 05/21/21  Discharge Plan and Services In-house Referral: Clinical Social Work   Post Acute Care Choice: [place]                               Social Determinants of Health (SDOH) Interventions     Readmission Risk Interventions Readmission Risk Prevention Plan 03/21/2019 03/21/2019  Transportation Screening - Complete  PCP or Specialist Appt within 3-5 Days - (No Data)  [place] or [place] - Complete  Social Work Consult for [place] Planning/Counseling - Patient refused  Palliative Care Screening - Not Applicable  Medication Review (RN Care Manager) Referral to Pharmacy Complete  Some recent  data might be hidden

## 2021-05-22 NOTE — TOC Progression Note (Signed)
Transition of Care [facility]) - Progression Note    Patient Details  Name: Francis Cardenas MRN: [id number] Date of Birth: May 20, 1951  Transition of Care [facility]) CM/SW [place], LCSW Phone Number:[phone] 05/22/2021, 10:07 AM  Clinical Narrative:     CSW followed up with Wendi from [facility] to ascertain if she knew why pt was not admitted yesterday. Wendi stated that she was unsure however would find out.  CSW reached out to pt's RN and she stated that pt was not dc due to it being too late.  CSW followed back up with Wendi from [employer] and update her about dc delay. She informed CSW that she has followed up [facility] and [facility] to ascertain if pt could be dc there today.  TOC team will continue to assist with discharge planning needs.    Expected Discharge Plan: [place] Barriers to Discharge: No Barriers Identified  Expected Discharge Plan and Services Expected Discharge Plan: [place] In-house Referral: Clinical Social Work   Post Acute Care Choice: [place] Living arrangements for the past 2 months: [place] Expected Discharge Date: 05/20/21                                     Social Determinants of Health (SDOH) Interventions    Readmission Risk Interventions Readmission Risk Prevention Plan 03/21/2019 03/21/2019  Transportation Screening - Complete  PCP or Specialist Appt within 3-5 Days - (No Data)  [place] or [place] - Complete  Social Work Consult for [place] Planning/Counseling - Patient refused  Palliative Care Screening - Not Applicable  Medication Review (RN Care Manager) Referral to Pharmacy Complete  Some recent data might be hidden

## 2021-05-22 NOTE — Progress Notes (Signed)
TRH night cross cover note:  I was contacted by RN  with update that patient will not be discharged this evening, as SNF is unable to take patient at this time.     Babs Bertin, DO Hospitalist

## 2021-05-22 NOTE — Progress Notes (Signed)
Report provided to the receiving Nurse from [facility], waiting for PTAR this point, Pt is aware, will continue to monitor  [name], RN

## 2021-05-31 ENCOUNTER — Ambulatory Visit: Payer: Medicare HMO | Admitting: Neurology

## 2021-06-08 NOTE — Progress Notes (Signed)
Cardiology Office Note:    Date:  06/09/2021   ID:  Francis Cardenas, DOB Sep 25, 1950, MRN [id number]  PCP:  Gerome Sam, MD   [facility] Providers Cardiologist:  Fransico Him, MD      Referring MD: Gerome Sam*   Follow-up for hypertension and chronic diastolic CHF  History of Present Illness:    Francis Cardenas is a 71 y.o. male with a hx of hypertension, chronic diastolic CHF, COPD, community-acquired pneumonia, GERD, diabetes mellitus type 2, prostate CA, alcohol abuse, depressive disorder, PTSD, hyperlipidemia, obesity, and hypokalemia.  Underwent cardiac catheterization and 2005 and was felt to have coronary spasm.  He underwent cardiac catheterization 04/09/2019 which showed normal coronary arteries and normal LVEF.  He was seen by Dr. Acie Fredrickson during his admission 04/05/2021 - 04/09/2021.  He was admitted with shortness of breath and respiratory failure secondary to acute CHF.  He was noted to have elevated troponins.  He reported that he was feeling better overall, off of his supplemental oxygenation, reported less shortness of breath, working with PT, and denied chest pain.  There was a long discussion about EtOH and he reported that he was not ready to commit to quitting.  He reported it was part of his everyday routine.  He declined a social work consult for community resources to help with abstaining.  His echocardiogram 03/17/2019 showed an LVEF of 50%, no LVH, moderately elevated pulmonary artery pressure, and trivial mitral valve regurgitation.  He presents the clinic today for follow-up evaluation states he feels well.  He is currently living at [facility] where he is doing physical therapy daily.  He reports that he has been doing functional type activities.  He presents in his wheelchair today but has been ambulating both with and without a walker at the facility.  He reports that he is feeling much better since being placed on colchicine.  We  reviewed his atrial fibrillation.  He is cardiac unaware.  He reports compliance with his apixaban medication.  He also presents with lower extremity support stockings.  He does have generalized bilateral lower extremity swelling.  I will give him a salty 6 diet sheet, have mild intense physical activity, continue his current medications, and plan follow-up in 3 to 4 months.  Today he denies chest pain, shortness of breath, increased lower extremity edema, fatigue, palpitations, melena, hematuria, hemoptysis, diaphoresis, weakness, presyncope, syncope, orthopnea, and PND.   Past Medical History:  Diagnosis Date   BPH (benign prostatic hyperplasia)    Cancer (HCC)    prostate   COPD (chronic obstructive pulmonary disease) (HCC)    Diabetes mellitus without complication (HCC)    Hypercholesteremia    Hypertension     Past Surgical History:  Procedure Laterality Date   ACHILLES TENDON REPAIR Left    KNEE ARTHROSCOPY Left    RIGHT/LEFT HEART CATH AND CORONARY ANGIOGRAPHY N/A 04/09/2019   Procedure: RIGHT/LEFT HEART CATH AND CORONARY ANGIOGRAPHY;  Surgeon: Martinique, Peter M, MD;  Location: [place] [facility];  Service: Cardiovascular;  Laterality: N/A;   TONSILLECTOMY     WRIST FRACTURE SURGERY Left     Current Medications: Current Meds  Medication Sig   albuterol (PROVENTIL HFA;VENTOLIN HFA) 108 (90 BASE) MCG/ACT inhaler Inhale 2 puffs into the lungs every 6 (six) hours as needed for wheezing.   apixaban (ELIQUIS) 5 MG TABS tablet Take 1 tablet (5 mg total) by mouth 2 (two) times daily.   atorvastatin (LIPITOR) 80 MG tablet Take 40 mg by  mouth daily with supper.   Carboxymethylcellulose Sodium 0.25 % SOLN Place 1 drop into both eyes 3 (three) times daily as needed (dry eyes/ irritation).   carvedilol (COREG) 3.125 MG tablet Take 1 tablet (3.125 mg total) by mouth 2 (two) times daily with a meal.   Cholecalciferol (VITAMIN D3 PO) Take 1 tablet by mouth daily with supper.   colchicine 0.6  MG tablet Take 1 tablet (0.6 mg total) by mouth daily.   diclofenac Sodium (VOLTAREN) 1 % GEL Apply 2 g topically 4 (four) times daily.   Ensure Max Protein (ENSURE MAX PROTEIN) LIQD Take 330 mLs (11 oz total) by mouth 2 (two) times daily. (Patient taking differently: Take 11 oz by mouth daily as needed (meal supplement).)   folic acid (FOLVITE) 1 MG tablet Take 1 tablet (1 mg total) by mouth daily.   furosemide (LASIX) 40 MG tablet Take 1 tablet (40 mg total) by mouth 2 (two) times daily.   gabapentin (NEURONTIN) 300 MG capsule Take 300 mg by mouth 3 (three) times daily.   guaifenesin (HUMIBID E) 400 MG TABS tablet Take 400 mg by mouth every morning.   insulin aspart (NOVOLOG) 100 UNIT/ML injection Inject 0-15 Units into the skin 3 (three) times daily with meals.   insulin aspart (NOVOLOG) 100 UNIT/ML injection Inject 0-5 Units into the skin at bedtime.   lisinopril (ZESTRIL) 10 MG tablet Take 1 tablet (10 mg total) by mouth every morning. Hold for SBP <100   Magnesium Oxide 420 MG TABS Take 420 mg by mouth daily with supper.   melatonin 3 MG TABS tablet Take 1 tablet (3 mg total) by mouth at bedtime as needed.   mometasone-formoterol (DULERA) 100-5 MCG/ACT AERO Inhale 2 puffs into the lungs 2 (two) times daily.   Multiple Vitamins-Minerals (MULTIVITAMIN WITH MINERALS) tablet Take 1 tablet by mouth daily with lunch.    omeprazole (PRILOSEC) 20 MG capsule Take 20 mg by mouth daily with supper.   potassium chloride SA (KLOR-CON M) 20 MEQ tablet Take 1 tablet (20 mEq total) by mouth daily with supper.   PRESCRIPTION MEDICATION Inhale into the lungs at bedtime. CPAP   spironolactone (ALDACTONE) 25 MG tablet Take 25 mg by mouth every morning.   thiamine 100 MG tablet Take 1 tablet (100 mg total) by mouth daily. (Patient taking differently: Take 100 mg by mouth daily. Vitamin B1)   vitamin B-12 (CYANOCOBALAMIN) 500 MCG tablet Take 500 mcg by mouth daily with supper.     Allergies:   Oxcarbazepine  and Zolpidem   Social History   Socioeconomic History   Marital status: Married    Spouse name: Not on file   Number of children: Not on file   Years of education: Not on file   Highest education level: Not on file  Occupational History   Not on file  Tobacco Use   Smoking status: Former   Smokeless tobacco: Never  Vaping Use   Vaping Use: Never used  Substance and Sexual Activity   Alcohol use: Yes    Alcohol/week: 8.0 standard drinks    Types: 8 Cans of beer per week    Comment: 8 cans beer/day   Drug use: No   Sexual activity: Not Currently  Other Topics Concern   Not on file  Social History Narrative   Not on file   Social Determinants of Health   Financial Resource Strain: Not on file  Food Insecurity: Not on file  Transportation Needs: Not on file  Physical Activity: Not on file  Stress: Not on file  Social Connections: Not on file     Family History: The patient's family history includes CAD in his mother; Heart attack in his brother and father; Leukemia in his mother.  ROS:   Please see the history of present illness.     All other systems reviewed and are negative.   Risk Assessment/Calculations:           Physical Exam:    VS:  BP (!) 120/50    Pulse 62    Ht 5\' 10"  (1.778 m)    Wt 288 lb 6.4 oz (130.8 kg)    SpO2 98%    BMI 41.38 kg/m     Wt Readings from Last 3 Encounters:  06/09/21 288 lb 6.4 oz (130.8 kg)  05/22/21 278 lb 7.1 oz (126.3 kg)  02/19/21 292 lb (132.5 kg)     GEN:  Well nourished, well developed in no acute distress HEENT: Normal NECK: No JVD; No carotid bruits LYMPHATICS: No lymphadenopathy CARDIAC: Generalized bilateral lower extremity swelling ,RRR, no murmurs, rubs, gallops RESPIRATORY:  Clear to auscultation without rales, wheezing or rhonchi  ABDOMEN: Soft, non-tender, non-distended MUSCULOSKELETAL:  No edema; No deformity  SKIN: Warm and dry NEUROLOGIC:  Alert and oriented x 3 PSYCHIATRIC:  Normal affect     EKGs/Labs/Other Studies Reviewed:    The following studies were reviewed today: Echocardiogram 03/17/2019 IMPRESSIONS     1. Left ventricular ejection fraction, by visual estimation, is 50%. The  left ventricle has normal function. Normal left ventricular size. There is  no left ventricular hypertrophy.   2. Global right ventricle has normal systolic function.The right  ventricular size is normal. No increase in right ventricular wall  thickness.   3. Left atrial size was normal.   4. Right atrial size was normal.   5. The mitral valve is normal in structure. Mild mitral valve  regurgitation. No evidence of mitral stenosis.   6. The tricuspid valve is normal in structure. Tricuspid valve  regurgitation is trivial.   7. The aortic valve is normal in structure. Aortic valve regurgitation  was not visualized by color flow Doppler. Structurally normal aortic  valve, with no evidence of sclerosis or stenosis.   8. The pulmonic valve was normal in structure. Pulmonic valve  regurgitation is not visualized by color flow Doppler.   9. Moderately elevated pulmonary artery systolic pressure.  10. The inferior vena cava is normal in size with greater than 50%  respiratory variability, suggesting right atrial pressure of 3 mmHg.  11. Left ventricular diastolic Doppler parameters are indeterminate  pattern of LV diastolic filling.  Cardiac catheterization 34/74/2595 LV end diastolic pressure is moderately elevated. The left ventricular systolic function is normal. The left ventricular ejection fraction is 55-65% by visual estimate. There is trivial (1+) mitral regurgitation. There is no aortic valve stenosis. Hemodynamic findings consistent with moderate pulmonary hypertension.   1. Normal coronary anatomy 2. Normal LV systolic function 3. Moderately elevated LV filling pressures. Large V waves to 58 mm Hg c/w LV restriction. No significant MR noted. 4. No AV gradient. 5. Moderate  pulmonary HTN 6. Preserved cardiac output. Index 3.   EKG:  EKG is  ordered today.  The ekg ordered today demonstrates atrial fibrillation 62 bpm  Recent Labs: 05/14/2021: B Natriuretic Peptide 458.2 05/15/2021: ALT 15; TSH 2.353 05/19/2021: Magnesium 2.4 05/20/2021: BUN 36; Creatinine, Ser 0.98; Hemoglobin 10.1; Platelets 261; Potassium 4.0; Sodium 132  Recent  Lipid Panel    Component Value Date/Time   CHOL 125 04/10/2019 0541   TRIG 103 04/10/2019 0541   HDL 41 04/10/2019 0541   CHOLHDL 3.0 04/10/2019 0541   VLDL 21 04/10/2019 0541   LDLCALC 63 04/10/2019 0541    ASSESSMENT & PLAN    Diastolic CHF with moderate pulmonary hypertension-no increased DOE or activity intolerance.  Received IV diuresis and supplemental potassium during previous admission for acute diastolic CHF. Doing daily PT and tolerating well Continue daily weights Fluid restriction Heart healthy low-sodium diet-salty 6 given Continue carvedilol, furosemide, potassium, spironolactone  Atrial fibrillation- Ekg today shows A. Fib 62 BPM. Cardiac unaware and rate controlled. Continue BB, Apixaban Avoid triggers Heart healthy low-sodium diet Increase physical activity as tolerated  H/O elevated troponins-no episodes of arm neck back or chest discomfort.  Noted during 11/20 admission.Cardiac catheterization 04/09/2019 showed normal coronary anatomy. Continue carvedilol, atorvastatin Heart healthy low-sodium diet-salty 6 given Increase physical activity as tolerated  Essential hypertension-BP today 120/50.  Well-controlled at home. Continue carvedilol, lisinopril, furosemide, spironolactone Heart healthy low-sodium diet-salty 6 given Increase physical activity as tolerated  Hyperlipidemia-LDL 63 on 04/10/19 Continue atorvastatin Heart healthy low-sodium high-fiber diet Increase physical activity as tolerated [facility]  [facility]-reports compliance with CPAP Continue CPAP use [facility]  Disposition: Follow-up with Dr. Radford Pax or APP in 3-4 months.       Medication Adjustments/Labs and Tests Ordered: Current medicines are reviewed at length with the patient today.  Concerns regarding medicines are outlined above.  Orders Placed This Encounter  Procedures   EKG 12-Lead   No orders of the defined types were placed in this encounter.   Patient Instructions  Medication Instructions:  Your Physician recommend you continue on your current medication as directed.    *If you need a refill on your cardiac medications before your next appointment, please call your pharmacy*   Lab Work: None ordered today   Testing/Procedures: None ordered today    Follow-Up: At [facility], you and your health needs are our priority.  As part of our continuing mission to provide you with exceptional heart care, we have created designated Provider Care Teams.  These Care Teams include your primary Cardiologist (physician) and Advanced Practice Providers (APPs -  Physician Assistants and Nurse Practitioners) who all work together to provide you with the care you need, when you need it.  We recommend signing up for the patient portal called "MyChart".  Sign up information is provided on this After Visit Summary.  MyChart is used to connect with patients for Virtual Visits (Telemedicine).  Patients are able to view lab/test results, encounter notes, upcoming appointments, etc.  Non-urgent messages can be sent to your provider as well.   To learn more about what you can do with MyChart, go to [email or web address].    Your next appointment:   3-4 month(s)  The format for your next appointment:   In Person  Provider:   Fransico Him, MD     Signed, Deberah Pelton, NP  06/09/2021 11:34 AM      Notice: This dictation was prepared with Dragon dictation along with smaller phrase technology. Any transcriptional errors that result from this process are unintentional and may not be  corrected upon review.  I spent 20 minutes examining this patient, reviewing medications, and using patient centered shared decision making involving her cardiac care.  Prior to her visit I spent greater than 20 minutes reviewing her past medical history,  medications,  and prior cardiac tests.

## 2021-06-09 ENCOUNTER — Encounter (HOSPITAL_BASED_OUTPATIENT_CLINIC_OR_DEPARTMENT_OTHER): Payer: Self-pay | Admitting: General Practice

## 2021-06-09 ENCOUNTER — Ambulatory Visit (INDEPENDENT_AMBULATORY_CARE_PROVIDER_SITE_OTHER): Payer: No Typology Code available for payment source | Admitting: General Practice

## 2021-06-09 ENCOUNTER — Other Ambulatory Visit: Payer: Self-pay

## 2021-06-09 VITALS — BP 120/50 | HR 62 | Ht 70.0 in | Wt 288.4 lb

## 2021-06-09 DIAGNOSIS — I10 Essential (primary) hypertension: Secondary | ICD-10-CM

## 2021-06-09 DIAGNOSIS — F1010 Alcohol abuse, uncomplicated: Secondary | ICD-10-CM

## 2021-06-09 DIAGNOSIS — R778 Other specified abnormalities of plasma proteins: Secondary | ICD-10-CM

## 2021-06-09 DIAGNOSIS — G4733 Obstructive sleep apnea (adult) (pediatric): Secondary | ICD-10-CM

## 2021-06-09 DIAGNOSIS — E782 Mixed hyperlipidemia: Secondary | ICD-10-CM

## 2021-06-09 DIAGNOSIS — I5032 Chronic diastolic (congestive) heart failure: Secondary | ICD-10-CM | POA: Diagnosis not present

## 2021-06-09 DIAGNOSIS — I4891 Unspecified atrial fibrillation: Secondary | ICD-10-CM

## 2021-06-09 NOTE — Patient Instructions (Signed)
Medication Instructions:  Your Physician recommend you continue on your current medication as directed.    *If you need a refill on your cardiac medications before your next appointment, please call your pharmacy*   Lab Work: None ordered today   Testing/Procedures: None ordered today    Follow-Up: At [facility], you and your health needs are our priority.  As part of our continuing mission to provide you with exceptional heart care, we have created designated Provider Care Teams.  These Care Teams include your primary Cardiologist (physician) and Advanced Practice Providers (APPs -  Physician Assistants and Nurse Practitioners) who all work together to provide you with the care you need, when you need it.  We recommend signing up for the patient portal called "MyChart".  Sign up information is provided on this After Visit Summary.  MyChart is used to connect with patients for Virtual Visits (Telemedicine).  Patients are able to view lab/test results, encounter notes, upcoming appointments, etc.  Non-urgent messages can be sent to your provider as well.   To learn more about what you can do with MyChart, go to [email or web address].    Your next appointment:   3-4 month(s)  The format for your next appointment:   In Person  Provider:   Fransico Him, MD

## 2021-08-03 DIAGNOSIS — M1A09X0 Idiopathic chronic gout, multiple sites, without tophus (tophi): Secondary | ICD-10-CM | POA: Insufficient documentation

## 2021-08-13 ENCOUNTER — Ambulatory Visit: Payer: Medicare HMO | Admitting: Neurology

## 2021-08-30 ENCOUNTER — Inpatient Hospital Stay (HOSPITAL_BASED_OUTPATIENT_CLINIC_OR_DEPARTMENT_OTHER)
Admission: EM | Admit: 2021-08-30 | Discharge: 2021-09-02 | DRG: 291 | Disposition: A | Discharge status: Home / Self Care | Payer: No Typology Code available for payment source | Attending: Internal Medicine | Admitting: Internal Medicine

## 2021-08-30 ENCOUNTER — Other Ambulatory Visit: Payer: Self-pay

## 2021-08-30 ENCOUNTER — Emergency Department (HOSPITAL_BASED_OUTPATIENT_CLINIC_OR_DEPARTMENT_OTHER): Payer: No Typology Code available for payment source | Admitting: Radiology

## 2021-08-30 ENCOUNTER — Encounter (HOSPITAL_BASED_OUTPATIENT_CLINIC_OR_DEPARTMENT_OTHER): Payer: Self-pay | Admitting: Obstetrics and Gynecology

## 2021-08-30 DIAGNOSIS — Z87891 Personal history of nicotine dependence: Secondary | ICD-10-CM | POA: Diagnosis not present

## 2021-08-30 DIAGNOSIS — M109 Gout, unspecified: Secondary | ICD-10-CM | POA: Diagnosis present

## 2021-08-30 DIAGNOSIS — I5033 Acute on chronic diastolic (congestive) heart failure: Secondary | ICD-10-CM | POA: Diagnosis present

## 2021-08-30 DIAGNOSIS — N400 Enlarged prostate without lower urinary tract symptoms: Secondary | ICD-10-CM | POA: Diagnosis present

## 2021-08-30 DIAGNOSIS — E669 Obesity, unspecified: Secondary | ICD-10-CM | POA: Diagnosis present

## 2021-08-30 DIAGNOSIS — Z7901 Long term (current) use of anticoagulants: Secondary | ICD-10-CM | POA: Diagnosis not present

## 2021-08-30 DIAGNOSIS — I5031 Acute diastolic (congestive) heart failure: Secondary | ICD-10-CM | POA: Diagnosis not present

## 2021-08-30 DIAGNOSIS — J9601 Acute respiratory failure with hypoxia: Secondary | ICD-10-CM | POA: Diagnosis present

## 2021-08-30 DIAGNOSIS — J189 Pneumonia, unspecified organism: Secondary | ICD-10-CM | POA: Diagnosis present

## 2021-08-30 DIAGNOSIS — I272 Pulmonary hypertension, unspecified: Secondary | ICD-10-CM | POA: Diagnosis present

## 2021-08-30 DIAGNOSIS — I509 Heart failure, unspecified: Secondary | ICD-10-CM

## 2021-08-30 DIAGNOSIS — I480 Paroxysmal atrial fibrillation: Secondary | ICD-10-CM | POA: Diagnosis present

## 2021-08-30 DIAGNOSIS — I4819 Other persistent atrial fibrillation: Secondary | ICD-10-CM | POA: Diagnosis present

## 2021-08-30 DIAGNOSIS — K219 Gastro-esophageal reflux disease without esophagitis: Secondary | ICD-10-CM | POA: Diagnosis present

## 2021-08-30 DIAGNOSIS — R0902 Hypoxemia: Secondary | ICD-10-CM

## 2021-08-30 DIAGNOSIS — R778 Other specified abnormalities of plasma proteins: Secondary | ICD-10-CM | POA: Diagnosis not present

## 2021-08-30 DIAGNOSIS — Z806 Family history of leukemia: Secondary | ICD-10-CM | POA: Diagnosis not present

## 2021-08-30 DIAGNOSIS — Z8249 Family history of ischemic heart disease and other diseases of the circulatory system: Secondary | ICD-10-CM

## 2021-08-30 DIAGNOSIS — F32A Depression, unspecified: Secondary | ICD-10-CM | POA: Diagnosis present

## 2021-08-30 DIAGNOSIS — I110 Hypertensive heart disease with heart failure: Principal | ICD-10-CM | POA: Diagnosis present

## 2021-08-30 DIAGNOSIS — Z20822 Contact with and (suspected) exposure to covid-19: Secondary | ICD-10-CM | POA: Diagnosis present

## 2021-08-30 DIAGNOSIS — I4891 Unspecified atrial fibrillation: Secondary | ICD-10-CM | POA: Diagnosis present

## 2021-08-30 DIAGNOSIS — F1010 Alcohol abuse, uncomplicated: Secondary | ICD-10-CM | POA: Diagnosis present

## 2021-08-30 DIAGNOSIS — G4733 Obstructive sleep apnea (adult) (pediatric): Secondary | ICD-10-CM

## 2021-08-30 DIAGNOSIS — Z8546 Personal history of malignant neoplasm of prostate: Secondary | ICD-10-CM

## 2021-08-30 DIAGNOSIS — E119 Type 2 diabetes mellitus without complications: Secondary | ICD-10-CM | POA: Diagnosis present

## 2021-08-30 DIAGNOSIS — D638 Anemia in other chronic diseases classified elsewhere: Secondary | ICD-10-CM | POA: Diagnosis present

## 2021-08-30 DIAGNOSIS — Z6841 Body Mass Index (BMI) 40.0 and over, adult: Secondary | ICD-10-CM

## 2021-08-30 DIAGNOSIS — E1169 Type 2 diabetes mellitus with other specified complication: Secondary | ICD-10-CM | POA: Diagnosis present

## 2021-08-30 DIAGNOSIS — Z79899 Other long term (current) drug therapy: Secondary | ICD-10-CM

## 2021-08-30 DIAGNOSIS — R0600 Dyspnea, unspecified: Secondary | ICD-10-CM

## 2021-08-30 DIAGNOSIS — J441 Chronic obstructive pulmonary disease with (acute) exacerbation: Secondary | ICD-10-CM | POA: Diagnosis present

## 2021-08-30 DIAGNOSIS — R001 Bradycardia, unspecified: Secondary | ICD-10-CM | POA: Diagnosis present

## 2021-08-30 DIAGNOSIS — Z888 Allergy status to other drugs, medicaments and biological substances status: Secondary | ICD-10-CM | POA: Diagnosis not present

## 2021-08-30 DIAGNOSIS — F1020 Alcohol dependence, uncomplicated: Secondary | ICD-10-CM | POA: Diagnosis present

## 2021-08-30 DIAGNOSIS — R0609 Other forms of dyspnea: Secondary | ICD-10-CM | POA: Diagnosis not present

## 2021-08-30 DIAGNOSIS — E7800 Pure hypercholesterolemia, unspecified: Secondary | ICD-10-CM | POA: Diagnosis present

## 2021-08-30 DIAGNOSIS — J069 Acute upper respiratory infection, unspecified: Secondary | ICD-10-CM | POA: Diagnosis present

## 2021-08-30 DIAGNOSIS — J449 Chronic obstructive pulmonary disease, unspecified: Secondary | ICD-10-CM | POA: Diagnosis present

## 2021-08-30 DIAGNOSIS — J4489 Other specified chronic obstructive pulmonary disease: Secondary | ICD-10-CM | POA: Diagnosis present

## 2021-08-30 DIAGNOSIS — E785 Hyperlipidemia, unspecified: Secondary | ICD-10-CM | POA: Diagnosis present

## 2021-08-30 DIAGNOSIS — Z794 Long term (current) use of insulin: Secondary | ICD-10-CM

## 2021-08-30 DIAGNOSIS — I10 Essential (primary) hypertension: Secondary | ICD-10-CM | POA: Diagnosis present

## 2021-08-30 DIAGNOSIS — Z79890 Hormone replacement therapy (postmenopausal): Secondary | ICD-10-CM

## 2021-08-30 DIAGNOSIS — E782 Mixed hyperlipidemia: Secondary | ICD-10-CM | POA: Diagnosis present

## 2021-08-30 LAB — BLOOD GAS, ARTERIAL
Acid-Base Excess: 6.1 mmol/L — ABNORMAL HIGH (ref 0.0–2.0)
Bicarbonate: 29.6 mmol/L — ABNORMAL HIGH (ref 20.0–28.0)
Drawn by: 441371
O2 Saturation: 99 %
Patient temperature: 37
pCO2 arterial: 38 mmHg (ref 32–48)
pH, Arterial: 7.5 — ABNORMAL HIGH (ref 7.35–7.45)
pO2, Arterial: 103 mmHg (ref 83–108)

## 2021-08-30 LAB — DIFFERENTIAL
Abs Immature Granulocytes: 0.07 10*3/uL (ref 0.00–0.07)
Basophils Absolute: 0 10*3/uL (ref 0.0–0.1)
Basophils Relative: 1 %
Eosinophils Absolute: 0 10*3/uL (ref 0.0–0.5)
Eosinophils Relative: 0 %
Immature Granulocytes: 1 %
Lymphocytes Relative: 9 %
Lymphs Abs: 0.6 10*3/uL — ABNORMAL LOW (ref 0.7–4.0)
Monocytes Absolute: 0.4 10*3/uL (ref 0.1–1.0)
Monocytes Relative: 7 %
Neutro Abs: 5.4 10*3/uL (ref 1.7–7.7)
Neutrophils Relative %: 82 %

## 2021-08-30 LAB — CBC
HCT: 35.1 % — ABNORMAL LOW (ref 39.0–52.0)
Hemoglobin: 11 g/dL — ABNORMAL LOW (ref 13.0–17.0)
MCH: 29 pg (ref 26.0–34.0)
MCHC: 31.3 g/dL (ref 30.0–36.0)
MCV: 92.6 fL (ref 80.0–100.0)
Platelets: 143 10*3/uL — ABNORMAL LOW (ref 150–400)
RBC: 3.79 MIL/uL — ABNORMAL LOW (ref 4.22–5.81)
RDW: 17.5 % — ABNORMAL HIGH (ref 11.5–15.5)
WBC: 6.5 10*3/uL (ref 4.0–10.5)
nRBC: 0.5 % — ABNORMAL HIGH (ref 0.0–0.2)

## 2021-08-30 LAB — COMPREHENSIVE METABOLIC PANEL
ALT: 10 U/L (ref 0–44)
AST: 12 U/L — ABNORMAL LOW (ref 15–41)
Albumin: 4.3 g/dL (ref 3.5–5.0)
Alkaline Phosphatase: 66 U/L (ref 38–126)
Anion gap: 9 (ref 5–15)
BUN: 14 mg/dL (ref 8–23)
CO2: 28 mmol/L (ref 22–32)
Calcium: 11 mg/dL — ABNORMAL HIGH (ref 8.9–10.3)
Chloride: 102 mmol/L (ref 98–111)
Creatinine, Ser: 1.08 mg/dL (ref 0.61–1.24)
GFR, Estimated: >60 mL/min (ref >60)
Glucose, Bld: 120 mg/dL — ABNORMAL HIGH (ref 70–99)
Potassium: 3.8 mmol/L (ref 3.5–5.1)
Sodium: 139 mmol/L (ref 135–145)
Total Bilirubin: 1.1 mg/dL (ref 0.3–1.2)
Total Protein: 7.2 g/dL (ref 6.5–8.1)

## 2021-08-30 LAB — I-STAT VENOUS BLOOD GAS, ED
Acid-Base Excess: 6 mmol/L — ABNORMAL HIGH (ref 0.0–2.0)
Bicarbonate: 30.6 mmol/L — ABNORMAL HIGH (ref 20.0–28.0)
Calcium, Ion: 1.44 mmol/L — ABNORMAL HIGH (ref 1.15–1.40)
HCT: 32 % — ABNORMAL LOW (ref 39.0–52.0)
Hemoglobin: 10.9 g/dL — ABNORMAL LOW (ref 13.0–17.0)
O2 Saturation: 85 %
Patient temperature: 102.3
Potassium: 3.8 mmol/L (ref 3.5–5.1)
Sodium: 140 mmol/L (ref 135–145)
TCO2: 32 mmol/L (ref 22–32)
pCO2, Ven: 47.1 mmHg (ref 44–60)
pH, Ven: 7.428 (ref 7.25–7.43)
pO2, Ven: 54 mmHg — ABNORMAL HIGH (ref 32–45)

## 2021-08-30 LAB — URINALYSIS, ROUTINE W REFLEX MICROSCOPIC
Bilirubin Urine: NEGATIVE
Glucose, UA: NEGATIVE mg/dL
Hgb urine dipstick: NEGATIVE
Ketones, ur: NEGATIVE mg/dL
Leukocytes,Ua: NEGATIVE
Nitrite: NEGATIVE
Protein, ur: 30 mg/dL — AB
Specific Gravity, Urine: 1.011 (ref 1.005–1.030)
pH: 7 (ref 5.0–8.0)

## 2021-08-30 LAB — GLUCOSE, CAPILLARY: Glucose-Capillary: 156 mg/dL — ABNORMAL HIGH (ref 70–99)

## 2021-08-30 LAB — LACTIC ACID, PLASMA
Lactic Acid, Venous: 0.9 mmol/L (ref 0.5–1.9)
Lactic Acid, Venous: 0.8 mmol/L (ref 0.5–1.9)

## 2021-08-30 LAB — PROTIME-INR
INR: 1.5 — ABNORMAL HIGH (ref 0.8–1.2)
Prothrombin Time: 18 seconds — ABNORMAL HIGH (ref 11.4–15.2)

## 2021-08-30 LAB — TROPONIN I (HIGH SENSITIVITY)
Troponin I (High Sensitivity): 62 ng/L — ABNORMAL HIGH (ref <18)
Troponin I (High Sensitivity): 67 ng/L — ABNORMAL HIGH (ref <18)

## 2021-08-30 LAB — RESP PANEL BY RT-PCR (FLU A&B, COVID) ARPGX2
Influenza A by PCR: NEGATIVE
Influenza B by PCR: NEGATIVE
SARS Coronavirus 2 by RT PCR: NEGATIVE

## 2021-08-30 LAB — BRAIN NATRIURETIC PEPTIDE: B Natriuretic Peptide: 529.7 pg/mL — ABNORMAL HIGH (ref 0.0–100.0)

## 2021-08-30 LAB — HEMOGLOBIN A1C
Hgb A1c MFr Bld: 5.5 % (ref 4.8–5.6)
Mean Plasma Glucose: 111.15 mg/dL

## 2021-08-30 LAB — APTT: aPTT: 40 seconds — ABNORMAL HIGH (ref 24–36)

## 2021-08-30 LAB — PROCALCITONIN: Procalcitonin: <0.1 ng/mL

## 2021-08-30 MED ORDER — MELATONIN 3 MG PO TABS: 3.0000 mg | ORAL_TABLET | Freq: Every evening | ORAL | Status: DC | PRN

## 2021-08-30 MED ORDER — CYANOCOBALAMIN 500 MCG PO TABS
500.0000 ug | ORAL_TABLET | Freq: Every day | ORAL | Status: DC
  Administered 2021-08-31 – 2021-09-01 (×2): 500 ug via ORAL
  Filled 2021-08-30 (×4): qty 1

## 2021-08-30 MED ORDER — IPRATROPIUM BROMIDE 0.02 % IN SOLN
0.5000 mg | Freq: Four times a day (QID) | RESPIRATORY_TRACT | Status: DC
  Administered 2021-08-30 – 2021-08-31 (×3): 0.5 mg via RESPIRATORY_TRACT
  Filled 2021-08-30 (×3): qty 2.5

## 2021-08-30 MED ORDER — FOLIC ACID 1 MG PO TABS
1.0000 mg | ORAL_TABLET | Freq: Every day | ORAL | Status: DC
Start: 2021-08-31 — End: 2021-09-02
  Administered 2021-08-31 – 2021-09-02 (×3): 1 mg via ORAL
  Filled 2021-08-30 (×3): qty 1

## 2021-08-30 MED ORDER — MAGNESIUM SULFATE 2 GM/50ML IV SOLN
2.0000 g | Freq: Once | INTRAVENOUS | Status: AC
  Administered 2021-08-30: 2 g via INTRAVENOUS
  Filled 2021-08-30: qty 50

## 2021-08-30 MED ORDER — ASPIRIN 81 MG PO CHEW
324.0000 mg | CHEWABLE_TABLET | Freq: Once | ORAL | Status: AC
  Administered 2021-08-30: 324 mg via ORAL
  Filled 2021-08-30: qty 4

## 2021-08-30 MED ORDER — SODIUM CHLORIDE 0.9 % IV SOLN
2.0000 g | INTRAVENOUS | Status: DC
  Administered 2021-08-30 – 2021-08-31 (×2): 2 g via INTRAVENOUS
  Filled 2021-08-30 (×2): qty 20

## 2021-08-30 MED ORDER — THIAMINE HCL 100 MG PO TABS
100.0000 mg | ORAL_TABLET | Freq: Every day | ORAL | Status: DC
  Administered 2021-08-30 – 2021-09-02 (×4): 100 mg via ORAL
  Filled 2021-08-30 (×4): qty 1

## 2021-08-30 MED ORDER — LORAZEPAM 2 MG/ML IJ SOLN: 1.0000 mg | INTRAMUSCULAR | Status: DC | PRN

## 2021-08-30 MED ORDER — METHYLPREDNISOLONE SODIUM SUCC 125 MG IJ SOLR
125.0000 mg | INTRAMUSCULAR | Status: AC
  Administered 2021-08-30: 125 mg via INTRAVENOUS
  Filled 2021-08-30: qty 2

## 2021-08-30 MED ORDER — LACTATED RINGERS IV BOLUS (SEPSIS)
1000.0000 mL | Freq: Once | INTRAVENOUS | Status: AC
  Administered 2021-08-30: 1000 mL via INTRAVENOUS

## 2021-08-30 MED ORDER — FUROSEMIDE 10 MG/ML IJ SOLN
40.0000 mg | Freq: Once | INTRAMUSCULAR | Status: AC
  Administered 2021-08-30: 40 mg via INTRAVENOUS
  Filled 2021-08-30: qty 4

## 2021-08-30 MED ORDER — IPRATROPIUM-ALBUTEROL 0.5-2.5 (3) MG/3ML IN SOLN
3.0000 mL | Freq: Once | RESPIRATORY_TRACT | Status: AC
  Administered 2021-08-30: 3 mL via RESPIRATORY_TRACT
  Filled 2021-08-30: qty 3

## 2021-08-30 MED ORDER — LACTATED RINGERS IV SOLN: INTRAVENOUS | Status: DC

## 2021-08-30 MED ORDER — ALBUTEROL SULFATE (2.5 MG/3ML) 0.083% IN NEBU: 2.5000 mg | INHALATION_SOLUTION | Freq: Four times a day (QID) | RESPIRATORY_TRACT | Status: DC | PRN

## 2021-08-30 MED ORDER — ACETAMINOPHEN 325 MG PO TABS
650.0000 mg | ORAL_TABLET | Freq: Once | ORAL | Status: AC | PRN
  Administered 2021-08-30: 650 mg via ORAL
  Filled 2021-08-30: qty 2

## 2021-08-30 MED ORDER — CARVEDILOL 3.125 MG PO TABS
3.1250 mg | ORAL_TABLET | Freq: Two times a day (BID) | ORAL | Status: DC
  Administered 2021-08-31 – 2021-09-01 (×3): 3.125 mg via ORAL
  Filled 2021-08-30 (×3): qty 1

## 2021-08-30 MED ORDER — ALBUTEROL SULFATE HFA 108 (90 BASE) MCG/ACT IN AERS: 2.0000 | INHALATION_SPRAY | Freq: Four times a day (QID) | RESPIRATORY_TRACT | Status: DC | PRN

## 2021-08-30 MED ORDER — SODIUM CHLORIDE 0.9 % IV SOLN
500.0000 mg | INTRAVENOUS | Status: DC
  Administered 2021-08-30: 500 mg via INTRAVENOUS
  Filled 2021-08-30 (×2): qty 5

## 2021-08-30 MED ORDER — APIXABAN 5 MG PO TABS
5.0000 mg | ORAL_TABLET | Freq: Two times a day (BID) | ORAL | Status: DC
Start: 2021-08-30 — End: 2021-09-02
  Administered 2021-08-30 – 2021-09-02 (×6): 5 mg via ORAL
  Filled 2021-08-30 (×6): qty 1

## 2021-08-30 MED ORDER — GABAPENTIN 300 MG PO CAPS
300.0000 mg | ORAL_CAPSULE | Freq: Three times a day (TID) | ORAL | Status: DC
  Administered 2021-08-30 – 2021-09-02 (×8): 300 mg via ORAL
  Filled 2021-08-30 (×8): qty 1

## 2021-08-30 MED ORDER — FUROSEMIDE 10 MG/ML IJ SOLN: 20.0000 mg | Freq: Two times a day (BID) | INTRAMUSCULAR | Status: DC

## 2021-08-30 MED ORDER — MOMETASONE FURO-FORMOTEROL FUM 100-5 MCG/ACT IN AERO
2.0000 | INHALATION_SPRAY | Freq: Two times a day (BID) | RESPIRATORY_TRACT | Status: DC
  Administered 2021-08-31 – 2021-09-02 (×5): 2 via RESPIRATORY_TRACT
  Filled 2021-08-30: qty 8.8

## 2021-08-30 MED ORDER — INSULIN ASPART 100 UNIT/ML IJ SOLN
0.0000 [IU] | Freq: Three times a day (TID) | INTRAMUSCULAR | Status: DC
  Administered 2021-08-31 (×3): 2 [IU] via SUBCUTANEOUS
  Administered 2021-09-01 (×3): 3 [IU] via SUBCUTANEOUS
  Administered 2021-09-02: 2 [IU] via SUBCUTANEOUS

## 2021-08-30 MED ORDER — ATORVASTATIN CALCIUM 40 MG PO TABS
40.0000 mg | ORAL_TABLET | Freq: Every day | ORAL | Status: DC
  Administered 2021-08-30 – 2021-09-01 (×3): 40 mg via ORAL
  Filled 2021-08-30 (×4): qty 1

## 2021-08-30 MED ORDER — METHYLPREDNISOLONE SODIUM SUCC 125 MG IJ SOLR: 120.0000 mg | INTRAMUSCULAR | Status: DC

## 2021-08-30 MED ORDER — LORAZEPAM 1 MG PO TABS: 1.0000 mg | ORAL_TABLET | ORAL | Status: DC | PRN

## 2021-08-30 MED ORDER — ADULT MULTIVITAMIN W/MINERALS CH
1.0000 | ORAL_TABLET | Freq: Every day | ORAL | Status: DC
  Administered 2021-08-30 – 2021-09-02 (×4): 1 via ORAL
  Filled 2021-08-30 (×4): qty 1

## 2021-08-30 MED ORDER — COLCHICINE 0.6 MG PO TABS
0.6000 mg | ORAL_TABLET | Freq: Every day | ORAL | Status: DC
  Administered 2021-08-31 – 2021-09-02 (×3): 0.6 mg via ORAL
  Filled 2021-08-30 (×3): qty 1

## 2021-08-30 MED ORDER — IPRATROPIUM-ALBUTEROL 0.5-2.5 (3) MG/3ML IN SOLN
3.0000 mL | Freq: Four times a day (QID) | RESPIRATORY_TRACT | Status: DC | PRN
  Administered 2021-08-30: 3 mL via RESPIRATORY_TRACT
  Filled 2021-08-30: qty 3

## 2021-08-30 MED ORDER — FUROSEMIDE 10 MG/ML IJ SOLN
40.0000 mg | Freq: Two times a day (BID) | INTRAMUSCULAR | Status: DC
  Administered 2021-08-31 – 2021-09-01 (×3): 40 mg via INTRAVENOUS
  Filled 2021-08-30 (×3): qty 4

## 2021-08-30 MED ORDER — LEVALBUTEROL HCL 0.63 MG/3ML IN NEBU
0.6300 mg | INHALATION_SOLUTION | Freq: Four times a day (QID) | RESPIRATORY_TRACT | Status: DC
  Administered 2021-08-30 – 2021-08-31 (×3): 0.63 mg via RESPIRATORY_TRACT
  Filled 2021-08-30 (×3): qty 3

## 2021-08-30 MED ORDER — PANTOPRAZOLE SODIUM 40 MG PO TBEC
40.0000 mg | DELAYED_RELEASE_TABLET | Freq: Every day | ORAL | Status: DC
Start: 2021-08-31 — End: 2021-09-02
  Administered 2021-08-31 – 2021-09-02 (×3): 40 mg via ORAL
  Filled 2021-08-30 (×3): qty 1

## 2021-08-30 MED ORDER — THIAMINE HCL 100 MG/ML IJ SOLN: 100.0000 mg | Freq: Every day | INTRAMUSCULAR | Status: DC

## 2021-08-30 NOTE — H&P (Signed)
?History and Physical  ? ? ?Patient: Francis Cardenas BDZ:[id number] DOB: 1951-05-06 ?DOA: 08/30/2021 ?DOS: the patient was seen and examined on 08/30/2021 ?PCP: Gerome Sam, MD  ?Patient coming from: Home ? ?Chief Complaint:  ?Chief Complaint  ?Patient presents with  ? Shortness of Breath  ? ?HPI: Francis Cardenas is a 71 y.o. male with medical history significant of COPD, diastolic CHF, pulmonary hypertension, atrial fibrillation on Eliquis, BPH, type 2 DM, hypertension, hyperlipidemia, morbidly obese , OSA on CPAP, ran out of his meds , presented to [facility] ED for sob, . On arrival he was found to be febrile with temp of 102.3, tachycardic of 109/min, and tachypnea 32/min, and hypertensive 161/99 mmHg. He was hypoxic and required  2lit/min of [place] oxygen to keep sats greater than 90%. CXR showing pulmonary congestion. Respiratory panel is negative.  Lactic acid is negative. BNP is 529.7 and troponins elevated at 62, 67. Pt reports symptoms started about 3 to 4 days ago, along with fevers, chills , dry cough and worsening swelling of his legs. Pt was referred to [facility] for admission to [facility] for acute respiratory failure with hypoxia.  ?About 30 min after arrival to [facility], pt became tachypneic, with audible wheezing, hypoxic requiring about 5 lit of  oxygen , tachycardic. Marland Kitchen ?Review of Systems: unable to review all systems due to the inability of the patient to answer questions. ?Past Medical History:  ?Diagnosis Date  ? BPH (benign prostatic hyperplasia)   ? Cancer [facility])   ? prostate  ? COPD (chronic obstructive pulmonary disease) ([place])   ? Diabetes mellitus without complication ([place])   ? Hypercholesteremia   ? Hypertension   ? ?Past Surgical History:  ?Procedure Laterality Date  ? ACHILLES TENDON REPAIR Left   ? KNEE ARTHROSCOPY Left   ? RIGHT/LEFT HEART CATH AND CORONARY ANGIOGRAPHY N/A 04/09/2019  ? Procedure: RIGHT/LEFT HEART CATH AND CORONARY ANGIOGRAPHY;  Surgeon: Martinique, Peter M, MD;  Location: [place]  [facility];  Service: Cardiovascular;  Laterality: N/A;  ? TONSILLECTOMY    ? WRIST FRACTURE SURGERY Left   ? ?Social History:  reports that he has quit smoking. He has never been exposed to tobacco smoke. He has never used smokeless tobacco. He reports current alcohol use of about 8.0 standard drinks per week. He reports that he does not use drugs. ? ?Allergies  ?Allergen Reactions  ? Oxcarbazepine Other (See Comments)  ?  Dizziness/ lightheaded  ? Zolpidem Nausea Only  ? ? ?Family History  ?Problem Relation Age of Onset  ? CAD Mother   ? Leukemia Mother   ? Heart attack Father   ? Heart attack Brother   ? ?Family history reviewed.  ? ?Prior to Admission medications   ?Medication Sig Start Date End Date Taking? Authorizing Provider  ?albuterol (PROVENTIL HFA;VENTOLIN HFA) 108 (90 BASE) MCG/ACT inhaler Inhale 2 puffs into the lungs every 6 (six) hours as needed for wheezing.    [provider]  ?apixaban (ELIQUIS) 5 MG TABS tablet Take 1 tablet (5 mg total) by mouth 2 (two) times daily. 05/20/21   Terrilee Croak, MD  ?atorvastatin (LIPITOR) 80 MG tablet Take 40 mg by mouth daily with supper.    [provider]  ?Carboxymethylcellulose Sodium 0.25 % SOLN Place 1 drop into both eyes 3 (three) times daily as needed (dry eyes/ irritation).    [provider]  ?carvedilol (COREG) 3.125 MG tablet Take 1 tablet (3.125 mg total) by mouth 2 (two) times daily with a  meal. 05/21/21   Terrilee Croak, MD  ?Cholecalciferol (VITAMIN D3 PO) Take 1 tablet by mouth daily with supper.    [provider]  ?colchicine 0.6 MG tablet Take 1 tablet (0.6 mg total) by mouth daily. 05/21/21   Terrilee Croak, MD  ?diclofenac Sodium (VOLTAREN) 1 % GEL Apply 2 g topically 4 (four) times daily. 05/20/21   Terrilee Croak, MD  ?Ensure Max Protein (ENSURE MAX PROTEIN) LIQD Take 330 mLs (11 oz total) by mouth 2 (two) times daily. ?Patient taking differently: Take 11 oz by mouth daily as needed (meal supplement). 12/12/18    [name], [name], MD  ?folic acid (FOLVITE) 1 MG tablet Take 1 tablet (1 mg total) by mouth daily. 05/21/21   Terrilee Croak, MD  ?furosemide (LASIX) 40 MG tablet Take 1 tablet (40 mg total) by mouth 2 (two) times daily. 05/20/21 08/18/21  Terrilee Croak, MD  ?gabapentin (NEURONTIN) 300 MG capsule Take 300 mg by mouth 3 (three) times daily.    [provider]  ?guaifenesin (HUMIBID E) 400 MG TABS tablet Take 400 mg by mouth every morning.    [provider]  ?insulin aspart (NOVOLOG) 100 UNIT/ML injection Inject 0-15 Units into the skin 3 (three) times daily with meals. 05/20/21   Terrilee Croak, MD  ?insulin aspart (NOVOLOG) 100 UNIT/ML injection Inject 0-5 Units into the skin at bedtime. 05/20/21   Terrilee Croak, MD  ?lisinopril (ZESTRIL) 10 MG tablet Take 1 tablet (10 mg total) by mouth every morning. Hold for SBP <100 05/20/21   Terrilee Croak, MD  ?Magnesium Oxide 420 MG TABS Take 420 mg by mouth daily with supper.    [provider]  ?melatonin 3 MG TABS tablet Take 1 tablet (3 mg total) by mouth at bedtime as needed. 05/20/21   Terrilee Croak, MD  ?mometasone-formoterol (DULERA) 100-5 MCG/ACT AERO Inhale 2 puffs into the lungs 2 (two) times daily. 05/20/21   Terrilee Croak, MD  ?Multiple Vitamins-Minerals (MULTIVITAMIN WITH MINERALS) tablet Take 1 tablet by mouth daily with lunch.     [provider]  ?omeprazole (PRILOSEC) 20 MG capsule Take 20 mg by mouth daily with supper.    [provider]  ?potassium chloride SA (KLOR-CON M) 20 MEQ tablet Take 1 tablet (20 mEq total) by mouth daily with supper. 05/20/21   Terrilee Croak, MD  ?PRESCRIPTION MEDICATION Inhale into the lungs at bedtime. CPAP    [provider]  ?spironolactone (ALDACTONE) 25 MG tablet Take 25 mg by mouth every morning.    [provider]  ?thiamine 100 MG tablet Take 1 tablet (100 mg total) by mouth daily. ?Patient taking differently: Take 100 mg by mouth daily. Vitamin B1 06/05/20    [name], MD  ?vitamin B-12 (CYANOCOBALAMIN) 500 MCG tablet Take 500 mcg by mouth daily with supper.    [provider]  ? ? ?Physical Exam: ?Vitals:  ? 08/30/21 1330 08/30/21 1513 08/30/21 1525 08/30/21 1645  ?BP: (!) 124/56  (!) 134/54 (!) 124/105  ?Pulse: 61 (!) 59 (!) 56 67  ?Resp: '14 14 17 16  '$ ?Temp:  98.2 ?F (36.8 ?C)  98.7 ?F (37.1 ?C)  ?TempSrc:  Oral  Oral  ?SpO2: 100% 100% 97% 98%  ?Weight:      ?Height:      ?General exam: Morbidly obese gentleman in moderate distress ?Respiratory system: tachypnea, diminished air entry throughout the lungs, unable to speak in full sentences.  ?Cardiovascular system: S1 & S2 heard, tachycardic, irregularly irregular, pedal edema  3+ ?Gastrointestinal system: Abdomen is nondistended, soft and nontender.  Normal bowel sounds heard. ?Central nervous system: Alert and oriented. Tremulous.  ?Extremities: 3+ leg edema.  ?Skin: No rashes ?Psychiatry: anxious.  ? ?Data Reviewed: ? ?Results for orders placed or performed during the hospital encounter of 08/30/21 (from the past 24 hour(s))  ?Resp Panel by RT-PCR (Flu A&B, Covid) Nasopharyngeal Swab     Status: None  ? Collection Time: 08/30/21 10:43 AM  ? Specimen: Nasopharyngeal Swab; Nasopharyngeal(NP) swabs in vial transport medium  ?Result Value Ref Range  ? SARS Coronavirus 2 by RT PCR NEGATIVE NEGATIVE  ? Influenza A by PCR NEGATIVE NEGATIVE  ? Influenza B by PCR NEGATIVE NEGATIVE  ?CBC     Status: Abnormal  ? Collection Time: 08/30/21 10:59 AM  ?Result Value Ref Range  ? WBC 6.5 4.0 - 10.5 K/uL  ? RBC 3.79 (L) 4.22 - 5.81 MIL/uL  ? Hemoglobin 11.0 (L) 13.0 - 17.0 g/dL  ? HCT 35.1 (L) 39.0 - 52.0 %  ? MCV 92.6 80.0 - 100.0 fL  ? MCH 29.0 26.0 - 34.0 pg  ? MCHC 31.3 30.0 - 36.0 g/dL  ? RDW 17.5 (H) 11.5 - 15.5 %  ? Platelets 143 (L) 150 - 400 K/uL  ? nRBC 0.5 (H) 0.0 - 0.2 %  ?Lactic acid, plasma     Status: None  ? Collection Time: 08/30/21 10:59 AM  ?Result Value Ref Range  ? Lactic Acid, Venous 0.9 0.5 - 1.9  mmol/L  ?Comprehensive metabolic panel     Status: Abnormal  ? Collection Time: 08/30/21 10:59 AM  ?Result Value Ref Range  ? Sodium 139 135 - 145 mmol/L  ? Potassium 3.8 3.5 - 5.1 mmol/L  ? Chloride 102 98 -

## 2021-08-30 NOTE — ED Notes (Signed)
Patient placed on 2L [place] for comfort. Oxygen saturation 97% on RA and 100% on 2L . ?

## 2021-08-30 NOTE — Progress Notes (Signed)
Called to room at [facility] per secretary stating patient complaints of difficulty breathing.  Upon arrival to room patient sitting on edge of bed with very labored breathing and audible wheezing from the door.  Respiratory was called for PRN breathing treatment and MD Akula at bedside to assess patient.  New orders obtained and followed.  Patient being placed on Bipap and awaiting a Progressive bed.  Rapid response RN at bedside until transfer. ?

## 2021-08-30 NOTE — H&P (Signed)
? ?NAME:  Francis Cardenas, MRN:  [id number], DOB:  02/20/1951, LOS: 0 ?ADMISSION DATE:  08/30/2021, CONSULTATION DATE:  08/30/21  ?REFERRING MD:  Francis Cardenas (hospitalist) CHIEF COMPLAINT:  shortness of breath ? ?History of Present Illness:  ?Francis Cardenas is a 71 yo man with a hx of COPD, BPH, HLD, HTN, DM here with dyspnea.  Was admitted to hospitalist team today from [place] ED.  ?Dyspnea x 3-4 days, rigors 2-3 nights as well as subjective fever during day.   ?B LE edema also noted by pt.  ?Normally uses CPAP at night, sleeps in recliner.   ?Normally drinks about 6-8 beers per day, none since Saturday.   ? ?In ED was satting in high 90s on 2L [place].  ?Received 1 L bolus around 12.   ?Tylenol, abtx started.   ?Lasix 40 IV at 1230, again at 630.  ?Febrile 102.3 initially.  ?  ?Xopenex and atrovent q6 prn.  ?Started on CAP abx.   ?On arrival here, was moving around in bed and suddenly became very wheezy and tachypneic.  Started on bipap to help with WOB, given nebs, solumedrol and lasix.  ?Feels improved now but still not "out of the woods".   ? ?Initial vbg 7.42/47/54  ?Abg at 7pm 7.5/38/103 ?BNP 529 ?Plt 143 WBC 6.5 ?INR 1.5 ?Bl cx p ?UA neg  ? ? ?Pertinent  Medical History  ?COPD ?BPH, prostate CA ?HLD  ?HTN ?DM ?Pulm HTN ?Afib on eliquis  ? ?Home meds: abluterol, dulera, mvi, eliquis, lipitor, coreg, colchicine, folic acid, thiamine, P53, lasix, sprinolactone, tgabapentin, novolog, lisinopril, magnesium, melatonin,  ? ?Echo 2020:  ? Left Ventricle: Left ventricular ejection fraction, by visual estimation,  ?is 50%. The left ventricle has normal function. There is no left  ?ventricular hypertrophy. Normal left ventricular size. Spectral Doppler  ?shows Left ventricular diastolic Doppler  ?parameters are indeterminate pattern of LV diastolic filling. ?Significant Hospital Events: ?Including procedures, antibiotic start and stop dates in addition to other pertinent events   ? ? ?Interim History / Subjective:   ? ? ?Objective   ?Blood pressure (!) 141/58, pulse 95, temperature 98.5 ?F (36.9 ?C), temperature source Oral, resp. rate (!) 32, height '5\' 10"'$  (1.778 m), weight 130.6 kg, SpO2 98 %. ?   ?FiO2 (%):  [50 %] 50 %  ? ?Intake/Output Summary (Last 24 hours) at 08/30/2021 1918 ?Last data filed at 08/30/2021 1400 ?Gross per 24 hour  ?Intake 1085 ml  ?Output 600 ml  ?Net 485 ml  ? ?Filed Weights  ? 08/30/21 1036  ?Weight: 130.6 kg  ? ? ?Examination: ?General: NAD, on bipap, pleasant and speaking in full sentences  ?HENT: ncat ?Lungs: CTAB ?Cardiovascular: rrr no mgr  ?Abdomen: nt, nd ?Extremities: 1+ pitting edema to ankles ?Neuro: a and o x 3  ? ?Resolved Hospital Problem list   ? ? ?Assessment & Plan:  ?Respiratory failure, hypoxemic:  ?Possible atypical pna, nothing outstanding on CXR suggestive of PNA, but has been febrile.  Consider viral source.  Flu and covid neg.  COPD exacerbation.  ?Agree with current management.   ?Cont steroids, nebs, Urine strep and legionella.  Agree with lasix.  ?Interesting that he has some RV failure on prior echo, consider some degree of pulm HTN likely, could be due to underlying lung disease.  ?Adding a procalcitonin on to labs.  ?Cont bipap for now overnight, being transferred to progressive.  I don't think he needs ICU level care now.  We will cont to  follow as consult.  ?Labs   ?CBC: ?Recent Labs  ?Lab 08/30/21 ?1059 08/30/21 ?1108  ?WBC 6.5  --   ?NEUTROABS 5.4  --   ?HGB 11.0* 10.9*  ?HCT 35.1* 32.0*  ?MCV 92.6  --   ?PLT 143*  --   ? ? ?Basic Metabolic Panel: ?Recent Labs  ?Lab 08/30/21 ?1059 08/30/21 ?1108  ?NA 139 140  ?K 3.8 3.8  ?CL 102  --   ?CO2 28  --   ?GLUCOSE 120*  --   ?BUN 14  --   ?CREATININE 1.08  --   ?CALCIUM 11.0*  --   ? ?GFR: ?Estimated Creatinine Clearance: 84 mL/min (by C-G formula based on SCr of 1.08 mg/dL). ?Recent Labs  ?Lab 08/30/21 ?1059 08/30/21 ?1300  ?WBC 6.5  --   ?LATICACIDVEN 0.9 0.8  ? ? ?Liver Function Tests: ?Recent Labs  ?Lab 08/30/21 ?1059  ?AST  12*  ?ALT 10  ?ALKPHOS 66  ?BILITOT 1.1  ?PROT 7.2  ?ALBUMIN 4.3  ? ?No results for input(s): LIPASE, AMYLASE in the last 168 hours. ?No results for input(s): AMMONIA in the last 168 hours. ? ?ABG ?   ?Component Value Date/Time  ? PHART 7.5 (H) 08/30/2021 1855  ? PCO2ART 38 08/30/2021 1855  ? PO2ART 103 08/30/2021 1855  ? HCO3 29.6 (H) 08/30/2021 1855  ? TCO2 32 08/30/2021 1108  ? ACIDBASEDEF 1.0 12/10/2018 1813  ? O2SAT 99 08/30/2021 1855  ?  ? ?Coagulation Profile: ?Recent Labs  ?Lab 08/30/21 ?1059  ?INR 1.5*  ? ? ?Cardiac Enzymes: ?No results for input(s): CKTOTAL, CKMB, CKMBINDEX, TROPONINI in the last 168 hours. ? ?HbA1C: ?Hgb A1c MFr Bld  ?Date/Time Value Ref Range Status  ?05/15/2021 03:29 AM 5.5 4.8 - 5.6 % Final  ?  Comment:  ?  (NOTE) ?Pre diabetes:          5.7%-6.4% ? ?Diabetes:              >6.4% ? ?Glycemic control for   <7.0% ?adults with diabetes ?  ?03/18/2019 12:59 AM 5.3 4.8 - 5.6 % Final  ?  Comment:  ?  (NOTE) ?Pre diabetes:          5.7%-6.4% ?Diabetes:              >6.4% ?Glycemic control for   <7.0% ?adults with diabetes ?  ? ? ?CBG: ?No results for input(s): GLUCAP in the last 168 hours. ? ?Review of Systems:   ?Review of Systems  ?Constitutional:  Positive for chills and fever. Negative for weight loss.  ?HENT:  Negative for hearing loss.   ?Eyes:  Negative for blurred vision.  ?Respiratory:  Positive for shortness of breath and wheezing. Negative for cough, hemoptysis and sputum production.   ?Cardiovascular:  Positive for leg swelling. Negative for chest pain.  ?Gastrointestinal:  Negative for heartburn.  ?Genitourinary:  Negative for dysuria.  ?Musculoskeletal:  Negative for myalgias.  ?Skin:  Negative for rash.  ?Neurological:  Negative for dizziness.  ?Endo/Heme/Allergies:  Does not bruise/bleed easily.  ?Psychiatric/Behavioral:  Negative for depression.   ? ? ?Past Medical History:  ?He,  has a past medical history of BPH (benign prostatic hyperplasia), Cancer ([place]), COPD (chronic  obstructive pulmonary disease) ([place]), Diabetes mellitus without complication ([place]), Hypercholesteremia, and Hypertension.  ? ?Surgical History:  ? ?Past Surgical History:  ?Procedure Laterality Date  ? ACHILLES TENDON REPAIR Left   ? KNEE ARTHROSCOPY Left   ? RIGHT/LEFT HEART CATH AND CORONARY ANGIOGRAPHY N/A 04/09/2019  ?  Procedure: RIGHT/LEFT HEART CATH AND CORONARY ANGIOGRAPHY;  Surgeon: Martinique, Peter M, MD;  Location: [facility];  Service: Cardiovascular;  Laterality: N/A;  ? TONSILLECTOMY    ? WRIST FRACTURE SURGERY Left   ?  ? ?Social History:  ? reports that he has quit smoking. He has never been exposed to tobacco smoke. He has never used smokeless tobacco. He reports current alcohol use of about 8.0 standard drinks per week. He reports that he does not use drugs.  ? ?Family History:  ?His family history includes CAD in his mother; Heart attack in his brother and father; Leukemia in his mother.  ? ?Allergies ?Allergies  ?Allergen Reactions  ? Oxcarbazepine Other (See Comments)  ?  Dizziness/ lightheaded  ? Zolpidem Nausea Only  ?  ? ?Home Medications  ?Prior to Admission medications   ?Medication Sig Start Date End Date Taking? Authorizing Provider  ?albuterol (PROVENTIL HFA;VENTOLIN HFA) 108 (90 BASE) MCG/ACT inhaler Inhale 2 puffs into the lungs every 6 (six) hours as needed for wheezing.    [provider]  ?apixaban (ELIQUIS) 5 MG TABS tablet Take 1 tablet (5 mg total) by mouth 2 (two) times daily. 05/20/21   Terrilee Croak, MD  ?atorvastatin (LIPITOR) 80 MG tablet Take 40 mg by mouth daily with supper.    [provider]  ?Carboxymethylcellulose Sodium 0.25 % SOLN Place 1 drop into both eyes 3 (three) times daily as needed (dry eyes/ irritation).    [provider]  ?carvedilol (COREG) 3.125 MG tablet Take 1 tablet (3.125 mg total) by mouth 2 (two) times daily with a meal. 05/21/21   Dahal, Marlowe Aschoff, MD  ?Cholecalciferol (VITAMIN D3 PO) Take 1 tablet by mouth daily with  supper.    [provider]  ?colchicine 0.6 MG tablet Take 1 tablet (0.6 mg total) by mouth daily. 05/21/21   Terrilee Croak, MD  ?diclofenac Sodium (VOLTAREN) 1 % GEL Apply 2 g topically 4 (four) time

## 2021-08-30 NOTE — ED Notes (Signed)
RT Note: RN obtained Venous Blood for ISTAT from IV. ?

## 2021-08-30 NOTE — ED Notes (Signed)
ED Provider at bedside. 

## 2021-08-30 NOTE — Sepsis Progress Note (Signed)
Sepsis protocol monitored by eLink 

## 2021-08-30 NOTE — ED Triage Notes (Signed)
Patient reports to the ER for ShOB. Patient reports  he was recently diagnosed with A-fib and sent for a cardioversion at the [facility] but when he got there they declined to cardiovert him due to his comorbidities. Patient reports worsening fatigue and ShoB ?

## 2021-08-30 NOTE — ED Provider Notes (Signed)
?[facility] EMERGENCY DEPT ?Provider Note ? ? ?CSN: [id number] ?Arrival date & time: 08/30/21  0944 ? ?  ? ?History ? ?Chief Complaint  ?Patient presents with  ? Shortness of Breath  ? ? ?Francis Cardenas is a 71 y.o. male. ? ? Patient as above with significant medical history as below, including COPD, BPH, hyperlipidemia, hypertension, DM who presents to the ED with complaint of dyspnea. ? ?Onset of symptoms 3 to 4 days ago.  Worsening exertional dyspnea.  Uses CPAP at nighttime, no daytime oxygen.  He typically does sleep in a recliner.  He becomes winded after mild exertion.  Nonproductive cough.  He is having some rhinorrhea worsened from baseline.  The last 2-3 evenings he has been having rigors.  Subjective fevers in the daytime but did not check temperature.  Mild increase to his lower extremity swelling.  No abdominal pain, nausea or vomiting.  He is tolerant p.o. intake is typical level.  ? ? ? ?Past Medical History: ?No date: BPH (benign prostatic hyperplasia) ?No date: Cancer [facility]) ?    Comment:  prostate ?No date: COPD (chronic obstructive pulmonary disease) ([place]) ?No date: Diabetes mellitus without complication ([place]) ?No date: Hypercholesteremia ?No date: Hypertension ? ?Past Surgical History: ?No date: ACHILLES TENDON REPAIR; Left ?No date: KNEE ARTHROSCOPY; Left ?04/09/2019: RIGHT/LEFT HEART CATH AND CORONARY ANGIOGRAPHY; N/A ?    Comment:  Procedure: RIGHT/LEFT HEART CATH AND CORONARY  ?             ANGIOGRAPHY;  Surgeon: Martinique, Peter M, MD;  Location: [facility] ?             INVASIVE CV LAB;  Service: Cardiovascular;  Laterality:  ?             N/A; ?No date: TONSILLECTOMY ?No date: WRIST FRACTURE SURGERY; Left  ? ? ?The history is provided by the patient. No language interpreter was used.  ?Shortness of Breath ?Associated symptoms: cough and fever   ?Associated symptoms: no abdominal pain, no chest pain, no headaches, no rash and no vomiting   ? ?  ? ?Home Medications ?Prior to Admission  medications   ?Medication Sig Start Date End Date Taking? Authorizing Provider  ?albuterol (PROVENTIL HFA;VENTOLIN HFA) 108 (90 BASE) MCG/ACT inhaler Inhale 2 puffs into the lungs every 6 (six) hours as needed for wheezing.   Yes [provider]  ?allopurinol (ZYLOPRIM) 100 MG tablet Take 200 mg by mouth daily. 08/03/21  Yes [provider]  ?ALPRAZolam (XANAX XR) 1 MG 24 hr tablet Take 1 mg by mouth at bedtime.   Yes [provider]  ?apixaban (ELIQUIS) 5 MG TABS tablet Take 1 tablet (5 mg total) by mouth 2 (two) times daily. 05/20/21  Yes Dahal, Marlowe Aschoff, MD  ?atorvastatin (LIPITOR) 80 MG tablet Take 40 mg by mouth daily with supper.   Yes [provider]  ?Carboxymethylcellulose Sodium 0.25 % SOLN Place 1 drop into both eyes 3 (three) times daily as needed (dry eyes/ irritation).   Yes [provider]  ?carvedilol (COREG) 3.125 MG tablet Take 1 tablet (3.125 mg total) by mouth 2 (two) times daily with a meal. 05/21/21  Yes Dahal, Marlowe Aschoff, MD  ?Cholecalciferol (VITAMIN D3 PO) Take 1 tablet by mouth daily with supper.   Yes [provider]  ?diclofenac Sodium (VOLTAREN) 1 % GEL Apply 2 g topically 4 (four) times daily. 05/20/21  Yes Dahal, Marlowe Aschoff, MD  ?Ensure Max Protein (ENSURE MAX PROTEIN) LIQD Take 330 mLs (11 oz  total) by mouth 2 (two) times daily. ?Patient taking differently: Take 11 oz by mouth daily as needed (meal supplement). 12/12/18  Yes [name], Nishant, MD  ?gabapentin (NEURONTIN) 300 MG capsule Take 300 mg by mouth 3 (three) times daily.   Yes [provider]  ?guaifenesin (HUMIBID E) 400 MG TABS tablet Take 400 mg by mouth every morning.   Yes [provider]  ?lisinopril (ZESTRIL) 10 MG tablet Take 1 tablet (10 mg total) by mouth every morning. Hold for SBP <100 05/20/21  Yes Dahal, Marlowe Aschoff, MD  ?Magnesium Oxide 420 MG TABS Take 420 mg by mouth daily with supper.   Yes [provider]  ?mometasone-formoterol (DULERA) 100-5  MCG/ACT AERO Inhale 2 puffs into the lungs 2 (two) times daily. 05/20/21  Yes Dahal, Marlowe Aschoff, MD  ?Multiple Vitamins-Minerals (MULTIVITAMIN WITH MINERALS) tablet Take 1 tablet by mouth daily with lunch.    Yes [provider]  ?omeprazole (PRILOSEC) 20 MG capsule Take 20 mg by mouth daily with supper.   Yes [provider]  ?potassium chloride SA (KLOR-CON M) 20 MEQ tablet Take 1 tablet (20 mEq total) by mouth daily with supper. 05/20/21  Yes Dahal, Marlowe Aschoff, MD  ?spironolactone (ALDACTONE) 25 MG tablet Take 25 mg by mouth every morning.   Yes [provider]  ?thiamine 100 MG tablet Take 1 tablet (100 mg total) by mouth daily. ?Patient taking differently: Take 100 mg by mouth daily. Vitamin B1 06/05/20  Yes [name], MD  ?vitamin B-12 (CYANOCOBALAMIN) 500 MCG tablet Take 500 mcg by mouth daily with supper.   Yes [provider]  ?colchicine 0.6 MG tablet Take 1 tablet (0.6 mg total) by mouth daily. ?Patient not taking: Reported on 08/31/2021 05/21/21   Terrilee Croak, MD  ?folic acid (FOLVITE) 1 MG tablet Take 1 tablet (1 mg total) by mouth daily. ?Patient not taking: Reported on 08/31/2021 05/21/21   Terrilee Croak, MD  ?furosemide (LASIX) 40 MG tablet Take 1 tablet (40 mg total) by mouth 2 (two) times daily. ?Patient not taking: Reported on 08/31/2021 05/20/21 08/18/21  Terrilee Croak, MD  ?insulin aspart (NOVOLOG) 100 UNIT/ML injection Inject 0-15 Units into the skin 3 (three) times daily with meals. ?Patient not taking: Reported on 08/31/2021 05/20/21   Terrilee Croak, MD  ?insulin aspart (NOVOLOG) 100 UNIT/ML injection Inject 0-5 Units into the skin at bedtime. ?Patient not taking: Reported on 08/31/2021 05/20/21   Terrilee Croak, MD  ?melatonin 3 MG TABS tablet Take 1 tablet (3 mg total) by mouth at bedtime as needed. ?Patient not taking: Reported on 08/31/2021 05/20/21   Terrilee Croak, MD  ?   ? ?Allergies    ?Oxcarbazepine and Zolpidem   ? ?Review of Systems   ?Review of Systems   ?Constitutional:  Positive for chills and fever.  ?HENT:  Negative for facial swelling and trouble swallowing.   ?Eyes:  Negative for photophobia and visual disturbance.  ?Respiratory:  Positive for cough and shortness of breath.   ?Cardiovascular:  Positive for leg swelling. Negative for chest pain and palpitations.  ?Gastrointestinal:  Negative for abdominal pain, nausea and vomiting.  ?Endocrine: Negative for polydipsia and polyuria.  ?Genitourinary:  Negative for difficulty urinating and hematuria.  ?Musculoskeletal:  Negative for gait problem and joint swelling.  ?Skin:  Negative for pallor and rash.  ?Neurological:  Negative for syncope and headaches.  ?Psychiatric/Behavioral:  Negative for agitation and confusion.   ? ?Physical Exam ?Updated Vital Signs ?BP (!) 106/44 (BP Location: Left Arm)  Pulse 67   Temp (!) 97.4 ?F (36.3 ?C) (Oral)   Resp 20   Ht '5\' 10"'$  (1.778 m)   Wt 130.9 kg   SpO2 99%   BMI 41.41 kg/m?  ?Physical Exam ?Vitals and nursing note reviewed.  ?Constitutional:   ?   General: He is not in acute distress. ?   Appearance: He is well-developed. He is obese.  ?HENT:  ?   Head: Normocephalic and atraumatic.  ?   Right Ear: External ear normal.  ?   Left Ear: External ear normal.  ?   Mouth/Throat:  ?   Mouth: Mucous membranes are moist.  ?Eyes:  ?   General: No scleral icterus. ?Cardiovascular:  ?   Rate and Rhythm: Normal rate and regular rhythm.  ?   Pulses: Normal pulses.  ?   Heart sounds: Normal heart sounds.  ?Pulmonary:  ?   Effort: Pulmonary effort is normal. Tachypnea present. No respiratory distress.  ?   Breath sounds: Decreased breath sounds and wheezing present.  ?   Comments: 3 word conversational dyspnea ?Abdominal:  ?   General: Abdomen is flat.  ?   Palpations: Abdomen is soft.  ?   Tenderness: There is no abdominal tenderness.  ?Musculoskeletal:     ?   General: Normal range of motion.  ?   Cervical back: Normal range of motion.  ?   Right lower leg: Edema (1+ pitting)  present.  ?   Left lower leg: Edema (1+ pitting) present.  ?Skin: ?   General: Skin is warm and dry.  ?   Capillary Refill: Capillary refill takes less than 2 seconds.  ?Neurological:  ?   Mental Status: He is alert and o

## 2021-08-30 NOTE — Progress Notes (Signed)
Pt transported from 20M  19 to 4E 12 without event. ?

## 2021-08-30 NOTE — ED Notes (Signed)
Both sets of blood cultures obtained prior to antibiotic administration.  ?

## 2021-08-30 NOTE — Significant Event (Signed)
Rapid Response Event Note  ? ?Reason for Call : Respiratory Distress ? ? ?Initial Focused Assessment: On arrival, patient sitting upright in bed on 5L [place] sating 91% with audible wheezing heard. He is alert and oriented, follows commands. Diminished lung sounds throughout. Duoneb given per RT. BiPAP placed per RT. Skin is warm and dry. Palpable pulses throughout. Pt has hx of COPD and wears CPAP at night at home. He does not wear oxygen at home. ? ?VS: T 98.7, BP 161/99, HR 109, RR 28, O2 91 on 5L [place] ?VS on BiPAP: BP 141/58, HR 95, O2 98% on 50% BiPAP ? ?Interventions:  ?-Placed on BiPAP by RT ? ?Plan of Care:  ?-Tx to 3E PCU when bed is cleaned.  ?-CCM consulted per Dr. Karleen Hampshire ? ?Handoff given to [place], RRN at bedside. ? ?Event Summary:  ? ?MD Notified: Dr. Karleen Hampshire ?Call Time: [phone] ?Arrival Time: [phone] ?End Time:0705 ? ?Fulton Reek, RN ?

## 2021-08-31 ENCOUNTER — Inpatient Hospital Stay (HOSPITAL_COMMUNITY): Payer: No Typology Code available for payment source

## 2021-08-31 DIAGNOSIS — R0609 Other forms of dyspnea: Secondary | ICD-10-CM | POA: Diagnosis not present

## 2021-08-31 DIAGNOSIS — J9601 Acute respiratory failure with hypoxia: Secondary | ICD-10-CM

## 2021-08-31 LAB — RETICULOCYTES
Immature Retic Fract: 27.1 % — ABNORMAL HIGH (ref 2.3–15.9)
RBC.: 3.56 MIL/uL — ABNORMAL LOW (ref 4.22–5.81)
Retic Count, Absolute: 91.5 10*3/uL (ref 19.0–186.0)
Retic Ct Pct: 2.6 % (ref 0.4–3.1)

## 2021-08-31 LAB — ECHOCARDIOGRAM COMPLETE
AR max vel: 2.41 cm2
AV Area VTI: 2.37 cm2
AV Area mean vel: 2.1 cm2
AV Mean grad: 5 mmHg
AV Peak grad: 8.6 mmHg
Ao pk vel: 1.47 m/s
Area-P 1/2: 4.57 cm2
Height: 70 in
S' Lateral: 4 cm
Weight: 4617.31 oz

## 2021-08-31 LAB — IRON AND TIBC
Iron: 13 ug/dL — ABNORMAL LOW (ref 45–182)
Saturation Ratios: 4 % — ABNORMAL LOW (ref 17.9–39.5)
TIBC: 367 ug/dL (ref 250–450)
UIBC: 354 ug/dL

## 2021-08-31 LAB — FERRITIN: Ferritin: 148 ng/mL (ref 24–336)

## 2021-08-31 LAB — BASIC METABOLIC PANEL
Anion gap: 9 (ref 5–15)
BUN: 14 mg/dL (ref 8–23)
CO2: 27 mmol/L (ref 22–32)
Calcium: 10.3 mg/dL (ref 8.9–10.3)
Chloride: 103 mmol/L (ref 98–111)
Creatinine, Ser: 1.14 mg/dL (ref 0.61–1.24)
GFR, Estimated: >60 mL/min (ref >60)
Glucose, Bld: 166 mg/dL — ABNORMAL HIGH (ref 70–99)
Potassium: 3.5 mmol/L (ref 3.5–5.1)
Sodium: 139 mmol/L (ref 135–145)

## 2021-08-31 LAB — FOLATE: Folate: 34.7 ng/mL (ref >5.9)

## 2021-08-31 LAB — CBC
HCT: 32.5 % — ABNORMAL LOW (ref 39.0–52.0)
Hemoglobin: 10.7 g/dL — ABNORMAL LOW (ref 13.0–17.0)
MCH: 30.3 pg (ref 26.0–34.0)
MCHC: 32.9 g/dL (ref 30.0–36.0)
MCV: 92.1 fL (ref 80.0–100.0)
Platelets: 144 10*3/uL — ABNORMAL LOW (ref 150–400)
RBC: 3.53 MIL/uL — ABNORMAL LOW (ref 4.22–5.81)
RDW: 17 % — ABNORMAL HIGH (ref 11.5–15.5)
WBC: 5.5 10*3/uL (ref 4.0–10.5)
nRBC: 0 % (ref 0.0–0.2)

## 2021-08-31 LAB — GLUCOSE, CAPILLARY
Glucose-Capillary: 166 mg/dL — ABNORMAL HIGH (ref 70–99)
Glucose-Capillary: 177 mg/dL — ABNORMAL HIGH (ref 70–99)
Glucose-Capillary: 191 mg/dL — ABNORMAL HIGH (ref 70–99)
Glucose-Capillary: 195 mg/dL — ABNORMAL HIGH (ref 70–99)

## 2021-08-31 LAB — VITAMIN B12: Vitamin B-12: 485 pg/mL (ref 180–914)

## 2021-08-31 LAB — PROCALCITONIN: Procalcitonin: 0.13 ng/mL

## 2021-08-31 MED ORDER — AMOXICILLIN-POT CLAVULANATE 875-125 MG PO TABS
1.0000 | ORAL_TABLET | Freq: Two times a day (BID) | ORAL | Status: DC
  Administered 2021-09-01 – 2021-09-02 (×3): 1 via ORAL
  Filled 2021-08-31 (×3): qty 1

## 2021-08-31 MED ORDER — LEVALBUTEROL HCL 0.63 MG/3ML IN NEBU
0.6300 mg | INHALATION_SOLUTION | Freq: Two times a day (BID) | RESPIRATORY_TRACT | Status: DC
  Administered 2021-08-31 – 2021-09-01 (×2): 0.63 mg via RESPIRATORY_TRACT
  Filled 2021-08-31 (×2): qty 3

## 2021-08-31 MED ORDER — PREDNISONE 20 MG PO TABS
60.0000 mg | ORAL_TABLET | Freq: Every day | ORAL | Status: DC
  Administered 2021-08-31 – 2021-09-01 (×2): 60 mg via ORAL
  Filled 2021-08-31 (×2): qty 3

## 2021-08-31 MED ORDER — IPRATROPIUM BROMIDE 0.02 % IN SOLN
0.5000 mg | Freq: Two times a day (BID) | RESPIRATORY_TRACT | Status: DC
  Administered 2021-08-31 – 2021-09-01 (×2): 0.5 mg via RESPIRATORY_TRACT
  Filled 2021-08-31 (×2): qty 2.5

## 2021-08-31 NOTE — Consult Note (Addendum)
?Cardiology Consultation:  ? ?Patient ID: Lewayne Bunting ?MRN: [id number]; DOB: 09/01/1950 ? ?Admit date: 08/30/2021 ?Date of Consult: 08/31/2021 ? ?PCP:  Gerome Sam, MD ?  ?[facility] Prov[facility] ?Cardiologist:  Fransico Him, MD    ? ?Patient Profile:  ? ?KENTON FORTIN is a 71 y.o. male with a hx of HTN, COPD, GERD, DM, HFpEF, prostate CA, ETOH use, depressive disorder, PTSD, HLD, Afib who is being seen 08/31/2021 for the evaluation of CHF at the request of Dr. Karleen Hampshire. ? ?History of Present Illness:  ? ?Mr. Lazarz is a 71 yo male with PMH noted above.  He has a remote history on nuclear stress testing along with cath in 2005 which showed coronary spasm but no atherosclerosis.  He was seen 03/2019 in the setting of acute CHF and complaints of chest pain.  During that admission he underwent nuclear stress testing that showed inferolateral defect with reversible ischemia. Underwent cardiac catheterization which showed normal coronary anatomy along with normal LV function, mildly elevated LV filling pressures, large V waves but no significant MR, moderate pulmonary hypertension.  He was diuresed with IV Lasix and discharged home on lasix '40mg'$  BID. Advised he needed to reduce his ETOH significantly. It was noted he may need to be switched from furosemide to torsemide pending his response.  ? ?Office on 06/09/2021 for follow-up with Coletta Memos, NP and reported doing well.  He was living at [facility] and doing physical therapy daily.  Notes from this office visit indicate he was in atrial fibrillation and had been placed on Eliquis, though prior to this visit? He was continued on home medications including coreg, furosemide, spiro, Eliquis, K+.  ? ?In talking with patient he has also been seeing the cardiologist at the [place] in [place]. He is unsure of when he was officically dx with Afib, but says he was set up for a cardioversion back in march which was canceled when he got there because of his  underlying COPD/OSA/obesity and ETOH use.  ? ?He presented to the [facility] ED on 4/10 with complaints of worsening shortness of breath and increased LE edema. Thinks he has gained about 9-10lbs over a weeks time. Has been compliant with his torsemide and urine output has been decent. Worsening orthopnea and PND.  ? ?Labs in the ED showed Na 139, K 3.8, Cr 1.08, BNP 529, hsTn 62>>67, lactic acid 0.9>>0.8, Hgb 11>>10.9>>10.7, procalcitonin 0.13. EKG showed rate controlled afib 80bpm, PVC. He was hypoxic requiring 2L Opp. Febrile with temp 102. Given IV lasix and placed on antibiotics. Transferred to [facility] for further management per IM. After arrival to [facility] developed worsening hypoxia and started on Bipap. Treated with nebs, solumedrol and additional IV lasix. Echo pending. Cardiology asked to evaluate.  ? ?Past Medical History:  ?Diagnosis Date  ? BPH (benign prostatic hyperplasia)   ? Cancer [facility])   ? prostate  ? COPD (chronic obstructive pulmonary disease) ([place])   ? Diabetes mellitus without complication ([place])   ? Hypercholesteremia   ? Hypertension   ? ? ?Past Surgical History:  ?Procedure Laterality Date  ? ACHILLES TENDON REPAIR Left   ? KNEE ARTHROSCOPY Left   ? RIGHT/LEFT HEART CATH AND CORONARY ANGIOGRAPHY N/A 04/09/2019  ? Procedure: RIGHT/LEFT HEART CATH AND CORONARY ANGIOGRAPHY;  Surgeon: Martinique, Peter M, MD;  Location: [facility];  Service: Cardiovascular;  Laterality: N/A;  ? TONSILLECTOMY    ? WRIST FRACTURE SURGERY Left   ?  ? ?Home Medications:  ?Prior to  Admission medications   ?Medication Sig Start Date End Date Taking? Authorizing Provider  ?albuterol (PROVENTIL HFA;VENTOLIN HFA) 108 (90 BASE) MCG/ACT inhaler Inhale 2 puffs into the lungs every 6 (six) hours as needed for wheezing.   Yes [provider]  ?allopurinol (ZYLOPRIM) 100 MG tablet Take 200 mg by mouth daily. 08/03/21  Yes [provider]  ?ALPRAZolam (XANAX XR) 1 MG 24 hr tablet Take 1 mg by mouth at bedtime.   Yes  [provider]  ?apixaban (ELIQUIS) 5 MG TABS tablet Take 1 tablet (5 mg total) by mouth 2 (two) times daily. 05/20/21  Yes Dahal, Marlowe Aschoff, MD  ?atorvastatin (LIPITOR) 80 MG tablet Take 40 mg by mouth daily with supper.   Yes [provider]  ?Carboxymethylcellulose Sodium 0.25 % SOLN Place 1 drop into both eyes 3 (three) times daily as needed (dry eyes/ irritation).   Yes [provider]  ?carvedilol (COREG) 3.125 MG tablet Take 1 tablet (3.125 mg total) by mouth 2 (two) times daily with a meal. 05/21/21  Yes Dahal, Marlowe Aschoff, MD  ?Cholecalciferol (VITAMIN D3 PO) Take 1 tablet by mouth daily with supper.   Yes [provider]  ?diclofenac Sodium (VOLTAREN) 1 % GEL Apply 2 g topically 4 (four) times daily. 05/20/21  Yes Dahal, Marlowe Aschoff, MD  ?Ensure Max Protein (ENSURE MAX PROTEIN) LIQD Take 330 mLs (11 oz total) by mouth 2 (two) times daily. ?Patient taking differently: Take 11 oz by mouth daily as needed (meal supplement). 12/12/18  Yes [name], Nishant, MD  ?gabapentin (NEURONTIN) 300 MG capsule Take 300 mg by mouth 3 (three) times daily.   Yes [provider]  ?guaifenesin (HUMIBID E) 400 MG TABS tablet Take 400 mg by mouth every morning.   Yes [provider]  ?lisinopril (ZESTRIL) 10 MG tablet Take 1 tablet (10 mg total) by mouth every morning. Hold for SBP <100 05/20/21  Yes Dahal, Marlowe Aschoff, MD  ?Magnesium Oxide 420 MG TABS Take 420 mg by mouth daily with supper.   Yes [provider]  ?mometasone-formoterol (DULERA) 100-5 MCG/ACT AERO Inhale 2 puffs into the lungs 2 (two) times daily. 05/20/21  Yes Dahal, Marlowe Aschoff, MD  ?Multiple Vitamins-Minerals (MULTIVITAMIN WITH MINERALS) tablet Take 1 tablet by mouth daily with lunch.    Yes [provider]  ?omeprazole (PRILOSEC) 20 MG capsule Take 20 mg by mouth daily with supper.   Yes [provider]  ?potassium chloride SA (KLOR-CON M) 20 MEQ tablet Take 1 tablet (20 mEq total) by mouth daily with  supper. 05/20/21  Yes Dahal, Marlowe Aschoff, MD  ?spironolactone (ALDACTONE) 25 MG tablet Take 25 mg by mouth every morning.   Yes [provider]  ?thiamine 100 MG tablet Take 1 tablet (100 mg total) by mouth daily. ?Patient taking differently: Take 100 mg by mouth daily. Vitamin B1 06/05/20  Yes [name], MD  ?vitamin B-12 (CYANOCOBALAMIN) 500 MCG tablet Take 500 mcg by mouth daily with supper.   Yes [provider]  ?colchicine 0.6 MG tablet Take 1 tablet (0.6 mg total) by mouth daily. ?Patient not taking: Reported on 08/31/2021 05/21/21   Terrilee Croak, MD  ?folic acid (FOLVITE) 1 MG tablet Take 1 tablet (1 mg total) by mouth daily. ?Patient not taking: Reported on 08/31/2021 05/21/21   Terrilee Croak, MD  ?furosemide (LASIX) 40 MG tablet Take 1 tablet (40 mg total) by mouth 2 (two) times daily. ?Patient not taking: Reported on 08/31/2021 05/20/21 08/18/21  Terrilee Croak, MD  ?insulin aspart (NOVOLOG)  100 UNIT/ML injection Inject 0-15 Units into the skin 3 (three) times daily with meals. ?Patient not taking: Reported on 08/31/2021 05/20/21   Terrilee Croak, MD  ?insulin aspart (NOVOLOG) 100 UNIT/ML injection Inject 0-5 Units into the skin at bedtime. ?Patient not taking: Reported on 08/31/2021 05/20/21   Terrilee Croak, MD  ?melatonin 3 MG TABS tablet Take 1 tablet (3 mg total) by mouth at bedtime as needed. ?Patient not taking: Reported on 08/31/2021 05/20/21   Terrilee Croak, MD  ? ? ?Inpatient Medications: ?Scheduled Meds: ? [START ON 09/01/2021] amoxicillin-clavulanate  1 tablet Oral Q12H  ? apixaban  5 mg Oral BID  ? atorvastatin  40 mg Oral Q supper  ? carvedilol  3.125 mg Oral BID WC  ? colchicine  0.6 mg Oral Daily  ? folic acid  1 mg Oral Daily  ? furosemide  40 mg Intravenous Q12H  ? gabapentin  300 mg Oral TID  ? insulin aspart  0-9 Units Subcutaneous TID WC  ? ipratropium  0.5 mg Nebulization BID  ? levalbuterol  0.63 mg Nebulization BID  ? mometasone-formoterol  2 puff Inhalation BID  ? multivitamin  with minerals  1 tablet Oral Daily  ? pantoprazole  40 mg Oral Daily  ? predniSONE  60 mg Oral Daily  ? thiamine  100 mg Oral Daily  ? Or  ? thiamine  100 mg Intravenous Daily  ? vitamin B-12  500 mcg Oral Q sup

## 2021-08-31 NOTE — Progress Notes (Addendum)
? ? ? [facility] ?                                                                            ? ? ?Francis Cardenas, is a 71 y.o. male, DOB - December 26, 1950, BWG:[id number] ?Admit date - 08/30/2021    ?Outpatient Primary MD for the patient is Gerome Sam, MD ? ?LOS - 1  days ? ? ? ?Brief summary  ? ? Francis Cardenas is a 71 y.o. male with medical history significant of COPD, diastolic CHF, pulmonary hypertension, atrial fibrillation on Eliquis, BPH, type 2 DM, hypertension, hyperlipidemia, morbidly obese , OSA on CPAP, ran out of his meds , presented to [facility] ED for sob, . On arrival he was found to be febrile with temp of 102.3, tachycardic of 109/min, and tachypnea 32/min, and hypertensive 161/99 mmHg. He was admitted for acute respiratory failure with hypoxia from CHF and COPD exacerbation.  ? ? ?Assessment & Plan  ? ? ?Assessment and Plan: ? ? ?Acute respiratory failure with hypoxia secondary to acute COPD exacerbation/ acute on chronic diastolic heart failure and possible CAP.  ?  ? ?Much improved after bipap overnight , IV solumedrol and bronchodilators and IV lasix.  ?Recommend to continue with CPAP at night and prn BIPAP.  ?Weaned oxygen to 4 lit/min, continue with scheduled xopenex and atrovent and dulera.  ?Transition to oral prednisone today as he has no wheezing on exam.  ?Check ambulating oxygen levels with PT in am.  ?Pt in good spirits .  ? ? ?  ?  ?  ?Acute on chronic diastolic heart failure / moderate pulm hypertension:  ?BNP elevated at 529, .  ?CXR showing pulm vascular congestion,.  ?Worsening pedal edema.  ?Start pt on lasix 40 mg every 12 hours, urine output not checked since admission.  ? Please check Strict intake and output, daily weights  ?Echocardiogram ordered and pending.  ?Pt on torsemide at home.  ?Cardiology consulted for recommendations.  ?  ?  ?  ?  ?Fever/ tachypnea, chills and rigors at home.  ?So far influenza and COVID pcr is negative.  ?Started the patient  empirically on IV rocephin and zithromax. As his pro calcitonin is minimal, will transition to oral Augmentin to complete 5 day course of antibiotics.  ?Sputum cultures, urine for strep and legionella ordered.  ?  ?  ?  ?Elevated troponins suspect from demand ischemia from CHF.  ?EKG does not show any ischemic changes.  ?Pt denies chest pain.  ?Echocardiogram ordered for further evaluation.  ?Pt follows up with Dr Radford Pax as outpatient.  ?  ?  ?  ?PAF:  ?On eliquis for anticoagulation.  ?Rate controlled.  ?  ?  ?  ?Morbid obesity.  ?Body mass index is 41.32 kg/m?. ?Increased risk of morbidity and mortality.  ?  ?  ?  ?Hypertension:  ?BP borderline. Currently on IV lasix and coreg 3.125 mg BID.  ?ON Lisinopril 10 mg , spironolactone 25 mg daily at home , which are  on hold for borderline BP parameters.  ?  ?  ?  ?Hyperlipidemia:  ?Resume statin.  ?  ?  ?  ?Type 2 DM,  ?Diet controlled.  ?A!c  is 5.5, on SSI.  ? ?  ?  ?  ?H/o GOUT  ?Resume home meds.  ?  ?  ?H/o alcohol abuse  ?- start on CIWA.  ?- no signs of withdrawal at this time.  ?  ?  ? Anemia of chronic disease:  ?Monitor.  ? ?GERD  ?Stable.  ? ? ?RN Pressure Injury Documentation: ?Pressure Injury 05/15/21 Buttocks Left Stage 2 -  Partial thickness loss of dermis presenting as a shallow open injury with a red, pink wound bed without slough. bruising around the site vs. deep tissue injury (Active)  ?05/15/21 0300  ?Location: Buttocks  ?Location Orientation: Left  ?Staging: Stage 2 -  Partial thickness loss of dermis presenting as a shallow open injury with a red, pink wound bed without slough.  ?Wound Description (Comments): bruising around the site vs. deep tissue injury  ?Present on Admission: Yes  ?Wound care will be consulted.  ? ? ? ?Estimated body mass index is 41.41 kg/m? as calculated from the following: ?  Height as of this encounter: '5\' 10"'$  (1.778 m). ?  Weight as of this encounter: 130.9 kg. ? ?Code Status: full code.  ?DVT Prophylaxis:   ?apixaban  (ELIQUIS) tablet 5 mg  ? ?Level of Care: Level of care: Progressive ?Family Communication: none at bedside.  ? ?Disposition Plan:     Remains inpatient appropriate:  IV lasix  ? ?Procedures:  ?None.  ? ?Consultants:   ?Cardiology  ?PCCM.  ? ?Antimicrobials:  ? ?Anti-infectives (From admission, onward)  ? ? Start     Dose/Rate Route Frequency Ordered Stop  ? 09/01/21 1000  amoxicillin-clavulanate (AUGMENTIN) 875-125 MG per tablet 1 tablet       ? 1 tablet Oral Every 12 hours 08/31/21 1303 09/04/21 0959  ? 08/30/21 1100  cefTRIAXone (ROCEPHIN) 2 g in sodium chloride 0.9 % 100 mL IVPB  Status:  Discontinued       ? 2 g ?200 mL/hr over 30 Minutes Intravenous Every 24 hours 08/30/21 1048 08/31/21 1303  ? 08/30/21 1100  azithromycin (ZITHROMAX) 500 mg in sodium chloride 0.9 % 250 mL IVPB  Status:  Discontinued       ? 500 mg ?250 mL/hr over 60 Minutes Intravenous Every 24 hours 08/30/21 1048 08/31/21 1303  ? ?  ? ? ? ?Medications ? ?Scheduled Meds: ? [START ON 09/01/2021] amoxicillin-clavulanate  1 tablet Oral Q12H  ? apixaban  5 mg Oral BID  ? atorvastatin  40 mg Oral Q supper  ? carvedilol  3.125 mg Oral BID WC  ? colchicine  0.6 mg Oral Daily  ? folic acid  1 mg Oral Daily  ? furosemide  40 mg Intravenous Q12H  ? gabapentin  300 mg Oral TID  ? insulin aspart  0-9 Units Subcutaneous TID WC  ? ipratropium  0.5 mg Nebulization BID  ? levalbuterol  0.63 mg Nebulization BID  ? mometasone-formoterol  2 puff Inhalation BID  ? multivitamin with minerals  1 tablet Oral Daily  ? pantoprazole  40 mg Oral Daily  ? predniSONE  60 mg Oral Daily  ? thiamine  100 mg Oral Daily  ? Or  ? thiamine  100 mg Intravenous Daily  ? vitamin B-12  500 mcg Oral Q supper  ? ?Continuous Infusions: ?PRN Meds:.albuterol, LORazepam **OR** LORazepam, melatonin ? ? ? ?Subjective:  ? ?Francis Cardenas was seen and examined today.  He appears in good spirits.  ? ?Objective:  ? ?Vitals:  ? 08/31/21 0430 08/31/21 0539  08/31/21 0802 08/31/21 1141  ?BP:  (!)  112/50  (!) 106/44  ?Pulse: 68 60  67  ?Resp:  17  20  ?Temp:  (!) 97.1 ?F (36.2 ?C)  (!) 97.4 ?F (36.3 ?C)  ?TempSrc:  Axillary  Oral  ?SpO2: 98% 99% 99% 99%  ?Weight:      ?Height:      ? ? ?Intake/Output Summary (Last 24 hours) at 08/31/2021 1313 ?Last data filed at 08/31/2021 0537 ?Gross per 24 hour  ?Intake 200 ml  ?Output 950 ml  ?Net -750 ml  ? ?Filed Weights  ? 08/30/21 1036 08/30/21 1645 08/31/21 0410  ?Weight: 130.6 kg 131.8 kg 130.9 kg  ? ? ? ?Exam ?General exam: Appears calm and comfortable  ?Respiratory system: no wheezing heard, air entry fair. On 4 lit of [place] oxygen.  ?Cardiovascular system: S1 & S2 heard, RRR. No JVD,  improving pedal edema.  ?Gastrointestinal system: Abdomen is nondistended, soft and nontender. Normal bowel sounds heard. ?Central nervous system: Alert and oriented. No focal neurological deficits. ?Extremities: Symmetric 5 x 5 power. ?Skin: No rashes, lesions or ulcers ?Psychiatry:  Mood & affect appropriate.  ? ? ?Data Reviewed:  I have personally reviewed following labs and imaging studies ? ? ?CBC ?Lab Results  ?Component Value Date  ? WBC 5.5 08/31/2021  ? RBC 3.53 (L) 08/31/2021  ? RBC 3.56 (L) 08/31/2021  ? HGB 10.7 (L) 08/31/2021  ? HCT 32.5 (L) 08/31/2021  ? MCV 92.1 08/31/2021  ? MCH 30.3 08/31/2021  ? PLT 144 (L) 08/31/2021  ? MCHC 32.9 08/31/2021  ? RDW 17.0 (H) 08/31/2021  ? LYMPHSABS 0.6 (L) 08/30/2021  ? MONOABS 0.4 08/30/2021  ? EOSABS 0.0 08/30/2021  ? BASOSABS 0.0 08/30/2021  ? ? ? ?Last metabolic panel ?Lab Results  ?Component Value Date  ? NA 139 08/31/2021  ? K 3.5 08/31/2021  ? CL 103 08/31/2021  ? CO2 27 08/31/2021  ? BUN 14 08/31/2021  ? CREATININE 1.14 08/31/2021  ? GLUCOSE 166 (H) 08/31/2021  ? GFRNONAA >60 08/31/2021  ? GFRAA >60 12/16/2019  ? CALCIUM 10.3 08/31/2021  ? PHOS 3.0 03/17/2019  ? PROT 7.2 08/30/2021  ? ALBUMIN 4.3 08/30/2021  ? BILITOT 1.1 08/30/2021  ? ALKPHOS 66 08/30/2021  ? AST 12 (L) 08/30/2021  ? ALT 10 08/30/2021  ? ANIONGAP 9 08/31/2021   ? ? ?CBG (last 3)  ?Recent Labs  ?  08/30/21 ?2241 08/31/21 ?0617 08/31/21 ?1140  ?GLUCAP 156* 166* 177*  ?  ? ? ?Coagulation Profile: ?Recent Labs  ?Lab 08/30/21 ?1059  ?INR 1.5*  ? ? ? ?Radiology Studies: ?DG Ch

## 2021-08-31 NOTE — Progress Notes (Signed)
Echocardiogram ?2D Echocardiogram has been performed. ? ?Francis Cardenas ?08/31/2021, 2:28 PM ?

## 2021-08-31 NOTE — Evaluation (Signed)
Physical Therapy Evaluation ?Patient Details ?Name: Francis Cardenas ?MRN: [id number] ?DOB: May 02, 1951 ?Today's Date: 08/31/2021 ? ?History of Present Illness ? Pt is 71 yo male admitted 08/30/21 from [facility] ED with multifactorial resp failure in setting of possible viral syndrome and PNE.  Pt with hx including COPD, BPH, HLD, HTN, DM, pulmonary HTN, afib, pt reports multiple orthopedic injuries  ?Clinical Impression ? Pt admitted with above diagnosis. At baseline, pt ambulated without AD and is not on home O2.  Today, pt ambulating 200' without AD, on 4 L O2, and requiring cues for safety and focus on breathing.  Pt with mild decreases in mobility and safety awareness that will benefit from PT services.  His sats were stable on 4 L O2. Pt currently with functional limitations due to the deficits listed below (see PT Problem List). Pt will benefit from skilled PT to increase their independence and safety with mobility to allow discharge to the venue listed below.   ?   ?   ? ?Recommendations for follow up therapy are one component of a multi-disciplinary discharge planning process, led by the attending physician.  Recommendations may be updated based on patient status, additional functional criteria and insurance authorization. ? ?Follow Up Recommendations No PT follow up ? ?  ?Assistance Recommended at Discharge PRN  ?Patient can return home with the following ? Help with stairs or ramp for entrance;A little help with walking and/or transfers;A little help with bathing/dressing/bathroom ? ?  ?Equipment Recommendations None recommended by PT  ?Recommendations for Other Services ?    ?  ?Functional Status Assessment Patient has had a recent decline in their functional status and demonstrates the ability to make significant improvements in function in a reasonable and predictable amount of time.  ? ?  ?Precautions / Restrictions Precautions ?Precautions: Fall  ? ?  ? ?Mobility ? Bed Mobility ?Overal bed mobility: Needs  Assistance ?Bed Mobility: Supine to Sit ?  ?  ?Supine to sit: HOB elevated, Min guard ?  ?  ?  ?  ? ?Transfers ?Overall transfer level: Needs assistance ?Equipment used: None ?Transfers: Sit to/from Stand ?Sit to Stand: Min guard ?  ?  ?  ?  ?  ?  ?  ? ?Ambulation/Gait ?Ambulation/Gait assistance: Min guard ?Gait Distance (Feet): 200 Feet ?Assistive device: None ?Gait Pattern/deviations: Step-through pattern, Decreased stride length ?Gait velocity: decreased ?  ?  ?General Gait Details: cues to focus on breathing ? ?Stairs ?  ?  ?  ?  ?  ? ?Wheelchair Mobility ?  ? ?Modified Rankin (Stroke Patients Only) ?  ? ?  ? ?Balance Overall balance assessment: Needs assistance ?Sitting-balance support: No upper extremity supported ?Sitting balance-Leahy Scale: Good ?  ?  ?Standing balance support: No upper extremity supported ?Standing balance-Leahy Scale: Good ?  ?  ?  ?  ?  ?  ?  ?  ?  ?  ?  ?  ?   ? ? ? ?Pertinent Vitals/Pain Pain Assessment ?Pain Assessment: No/denies pain  ? ? ?Home Living Family/patient expects to be discharged to:: Private residence ?Living Arrangements: Spouse/significant other ?Available Help at Discharge: Family;Available 24 hours/day ?Type of Home: House ?Home Access: Ramped entrance ?  ?  ?  ?Home Layout: One level ?Home Equipment: Rollator (4 wheels);Shower seat;Grab bars - tub/shower;Adaptive equipment ?   ?  ?Prior Function Prior Level of Function : Independent/Modified Independent;History of Falls (last six months) ?  ?  ?  ?  ?  ?  ?  Mobility Comments: Reports can ambulate in community, does not typically use AD; does report falls ?ADLs Comments: REports could do ADLs and assist with IADLs ?  ? ? ?Hand Dominance  ?   ? ?  ?Extremity/Trunk Assessment  ? Upper Extremity Assessment ?Upper Extremity Assessment: Overall WFL for tasks assessed ?  ? ?Lower Extremity Assessment ?Lower Extremity Assessment: Overall WFL for tasks assessed ?  ? ?Cervical / Trunk Assessment ?Cervical / Trunk Assessment:  Normal  ?Communication  ? Communication: No difficulties  ?Cognition Arousal/Alertness: Awake/alert ?Behavior During Therapy: [facility] for tasks assessed/performed ?Overall Cognitive Status: Within Functional Limits for tasks assessed ?  ?  ?  ?  ?  ?  ?  ?  ?  ?  ?  ?  ?  ?  ?  ?  ?General Comments: Pt very friendly./chatty - needs redirecting/focusing at times ?  ?  ? ?  ?General Comments General comments (skin integrity, edema, etc.): Pt recently weaned off bipap; on 4 L sats 98% rest 97% ambulation ? ?  ?Exercises    ? ?Assessment/Plan  ?  ?PT Assessment Patient needs continued PT services  ?PT Problem List Decreased strength;Decreased mobility;Decreased activity tolerance;Cardiopulmonary status limiting activity;Decreased balance;Decreased knowledge of use of DME ? ?   ?  ?PT Treatment Interventions DME instruction;Therapeutic activities;Gait training;Therapeutic exercise;Patient/family education;Stair training;Balance training;Functional mobility training   ? ?PT Goals (Current goals can be found in the Care Plan section)  ?Acute Rehab PT Goals ?Patient Stated Goal: return home ?PT Goal Formulation: With patient ?Time For Goal Achievement: 09/14/21 ?Potential to Achieve Goals: Good ? ?  ?Frequency Min 3X/week ?  ? ? ?Co-evaluation   ?  ?  ?  ?  ? ? ?  ?AM-PAC PT "6 Clicks" Mobility  ?Outcome Measure Help needed turning from your back to your side while in a flat bed without using bedrails?: None ?Help needed moving from lying on your back to sitting on the side of a flat bed without using bedrails?: None ?Help needed moving to and from a bed to a chair (including a wheelchair)?: A Little ?Help needed standing up from a chair using your arms (e.g., wheelchair or bedside chair)?: A Little ?Help needed to walk in hospital room?: A Little ?Help needed climbing 3-5 steps with a railing? : A Little ?6 Click Score: 20 ? ?  ?End of Session Equipment Utilized During Treatment: Gait belt ?Activity Tolerance: Patient  tolerated treatment well ?Patient left: with call bell/phone within reach;with chair alarm set;in chair ?Nurse Communication: Mobility status ?PT Visit Diagnosis: Other abnormalities of gait and mobility (R26.89);History of falling (Z91.81) ?  ? ?Time: 9480-1655 ?PT Time Calculation (min) (ACUTE ONLY): 35 min ? ? ?Charges:   PT Evaluation ?$PT Eval Low Complexity: 1 Low ?PT Treatments ?$Gait Training: 8-22 mins ?  ?   ? ? ?Abran Richard, PT ?Acute Rehab Services ?Pager [phone] ?[facility] [phone] ? ? ?Francis Cardenas ?08/31/2021, 11:50 AM ? ?

## 2021-08-31 NOTE — Consult Note (Signed)
WOC Nurse Consult Note: ?Reason for Consult: Consult requested for back and buttocks.  Pt states he had a wound to the buttocks during the previous admission, but it has healed.  Assessed skin when Pt was standing; it is intact to bilat buttocks and sacrum and back, no wounds. ?Please re-consult if further assistance is needed.  Thank-you,  ?Julien Girt MSN, RN, [place], [place], CNS ?[phone]  ? ?  ?

## 2021-08-31 NOTE — Progress Notes (Signed)
? ?NAME:  Francis Cardenas, Francis Cardenas:  [id number], DOB:  1950/11/18, LOS: 1 ?ADMISSION DATE:  08/30/2021, CONSULTATION DATE:  08/30/21  ?REFERRING MD:  Karleen Hampshire (hospitalist) CHIEF COMPLAINT:  shortness of breath ? ?History of Present Illness:  ?Mr. Salminen is a 71 yo man with a hx of COPD, BPH, HLD, HTN, DM here with dyspnea.  Was admitted to hospitalist team today from [place] ED.  ?Dyspnea x 3-4 days, rigors 2-3 nights as well as subjective fever during day.   ?B LE edema also noted by pt.  ?Normally uses CPAP at night, sleeps in recliner.   ?Normally drinks about 6-8 beers per day, none since Saturday.   ? ?In ED was satting in high 90s on 2L [place].  ?Received 1 L bolus around 12.   ?Tylenol, abtx started.   ?Lasix 40 IV at 1230, again at 630.  ?Febrile 102.3 initially.  ?  ?Xopenex and atrovent q6 prn.  ?Started on CAP abx.   ?On arrival here, was moving around in bed and suddenly became very wheezy and tachypneic.  Started on bipap to help with WOB, given nebs, solumedrol and lasix.  ?Feels improved now but still not "out of the woods".   ? ?Initial vbg 7.42/47/54  ?Abg at 7pm 7.5/38/103 ?BNP 529 ?Plt 143 WBC 6.5 ?INR 1.5 ?Bl cx p ?UA neg  ? ? ?Pertinent  Medical History  ?COPD ?BPH, prostate CA ?HLD  ?HTN ?DM ?Pulm HTN ?Afib on eliquis  ? ?Home meds: abluterol, dulera, mvi, eliquis, lipitor, coreg, colchicine, folic acid, thiamine, P32, lasix, sprinolactone, tgabapentin, novolog, lisinopril, magnesium, melatonin,  ? ?Echo 2020:  ? Left Ventricle: Left ventricular ejection fraction, by visual estimation,  ?is 50%. The left ventricle has normal function. There is no left  ?ventricular hypertrophy. Normal left ventricular size. Spectral Doppler  ?shows Left ventricular diastolic Doppler  ?parameters are indeterminate pattern of LV diastolic filling. ?Significant Hospital Events: ?Including procedures, antibiotic start and stop dates in addition to other pertinent events   ? ? ?Interim History / Subjective:   ?Patient move to progressive care last night with chills, respiratory distress.  Required BiPAP.  Received extra diuresis. ?Wore BiPAP all night overnight without difficulty ?To 4 L/min this morning ?I/O+ 335 cc total ?Feels improved ? ?Objective   ?Blood pressure (!) 112/50, pulse 60, temperature (!) 97.1 ?F (36.2 ?C), temperature source Axillary, resp. rate 17, height '5\' 10"'$  (1.778 m), weight 130.9 kg, SpO2 99 %. ?   ?FiO2 (%):  [30 %-50 %] 30 %  ? ?Intake/Output Summary (Last 24 hours) at 08/31/2021 0840 ?Last data filed at 08/31/2021 0537 ?Gross per 24 hour  ?Intake 1285 ml  ?Output 950 ml  ?Net 335 ml  ? ? ?Filed Weights  ? 08/30/21 1036 08/30/21 1645 08/31/21 0410  ?Weight: 130.6 kg 131.8 kg 130.9 kg  ? ? ?Examination: ?General: Obese man, comfortable in bed on nasal cannula O2 ?HENT: Oropharynx clear, no stridor ?Lungs: Distant and decreased to both bases, no crackles or wheezes ?Cardiovascular: Distant, regular, trace bilateral lower extremity edema ?Abdomen: Obese, nondistended with positive bowel sounds ?Extremities: No deformities ?Neuro: Awake, alert, appropriate ? ?Resolved Hospital Problem list   ? ? ?Assessment & Plan:  ?Multifactorial respiratory failure in setting of of possible viral syndrome, chills, fever, LE edema.  COVID and flu negative.  Chest x-ray with bilateral vascular prominence, consider evolving diastolic CHF (HTN, A fib) especially in light of his lower extremity edema.  Suspect some of his wheezing may  have been related to some volume overload.  Transient BiPAP, improved at this time. ?-Agree with diuresis, then back to his usual torsemide once stabilizing ?-Transition Solu-Medrol to prednisone and plan quick taper in absence of any current wheezing ?-Continue Dulera ?-would simplify abx to doxycycline and complete 7 days total. Presentation inconsistent with CAP > no cough, sputum; CXR unconvincing ?-mandatory BiPAp or CPAp qhs. BiPAP available for prn ?-start to mobilize, OOB to  chair ?-wean O2.  ? ? ?Labs   ?CBC: ?Recent Labs  ?Lab 08/30/21 ?1059 08/30/21 ?1108 08/31/21 ?0127  ?WBC 6.5  --  5.5  ?NEUTROABS 5.4  --   --   ?HGB 11.0* 10.9* 10.7*  ?HCT 35.1* 32.0* 32.5*  ?MCV 92.6  --  92.1  ?PLT 143*  --  144*  ? ? ? ?Basic Metabolic Panel: ?Recent Labs  ?Lab 08/30/21 ?1059 08/30/21 ?1108 08/31/21 ?0127  ?NA 139 140 139  ?K 3.8 3.8 3.5  ?CL 102  --  103  ?CO2 28  --  27  ?GLUCOSE 120*  --  166*  ?BUN 14  --  14  ?CREATININE 1.08  --  1.14  ?CALCIUM 11.0*  --  10.3  ? ? ?GFR: ?Estimated Creatinine Clearance: 79.7 mL/min (by C-G formula based on SCr of 1.14 mg/dL). ?Recent Labs  ?Lab 08/30/21 ?1059 08/30/21 ?1300 08/30/21 ?2100 08/31/21 ?0127  ?PROCALCITON  --   --  <0.10 0.13  ?WBC 6.5  --   --  5.5  ?LATICACIDVEN 0.9 0.8  --   --   ? ? ? ?Liver Function Tests: ?Recent Labs  ?Lab 08/30/21 ?1059  ?AST 12*  ?ALT 10  ?ALKPHOS 66  ?BILITOT 1.1  ?PROT 7.2  ?ALBUMIN 4.3  ? ? ?No results for input(s): LIPASE, AMYLASE in the last 168 hours. ?No results for input(s): AMMONIA in the last 168 hours. ? ?ABG ?   ?Component Value Date/Time  ? PHART 7.5 (H) 08/30/2021 1855  ? PCO2ART 38 08/30/2021 1855  ? PO2ART 103 08/30/2021 1855  ? HCO3 29.6 (H) 08/30/2021 1855  ? TCO2 32 08/30/2021 1108  ? ACIDBASEDEF 1.0 12/10/2018 1813  ? O2SAT 99 08/30/2021 1855  ? ?  ? ?Coagulation Profile: ?Recent Labs  ?Lab 08/30/21 ?1059  ?INR 1.5*  ? ? ? ?Cardiac Enzymes: ?No results for input(s): CKTOTAL, CKMB, CKMBINDEX, TROPONINI in the last 168 hours. ? ?HbA1C: ?Hgb A1c MFr Bld  ?Date/Time Value Ref Range Status  ?08/30/2021 07:19 PM 5.5 4.8 - 5.6 % Final  ?  Comment:  ?  (NOTE) ?Pre diabetes:          5.7%-6.4% ? ?Diabetes:              >6.4% ? ?Glycemic control for   <7.0% ?adults with diabetes ?  ?05/15/2021 03:29 AM 5.5 4.8 - 5.6 % Final  ?  Comment:  ?  (NOTE) ?Pre diabetes:          5.7%-6.4% ? ?Diabetes:              >6.4% ? ?Glycemic control for   <7.0% ?adults with diabetes ?  ? ? ?CBG: ?Recent Labs  ?Lab  08/30/21 ?2241 08/31/21 ?0617  ?GLUCAP 156* 166*  ? ? ?Review of Systems:   ?Review of Systems  ?Constitutional:  Positive for chills and fever. Negative for weight loss.  ?HENT:  Negative for hearing loss.   ?Eyes:  Negative for blurred vision.  ?Respiratory:  Positive for shortness of breath and wheezing. Negative for  cough, hemoptysis and sputum production.   ?Cardiovascular:  Positive for leg swelling. Negative for chest pain.  ?Gastrointestinal:  Negative for heartburn.  ?Genitourinary:  Negative for dysuria.  ?Musculoskeletal:  Negative for myalgias.  ?Skin:  Negative for rash.  ?Neurological:  Negative for dizziness.  ?Endo/Heme/Allergies:  Does not bruise/bleed easily.  ?Psychiatric/Behavioral:  Negative for depression.   ? ? ?Past Medical History:  ?He,  has a past medical history of BPH (benign prostatic hyperplasia), Cancer ([place]), COPD (chronic obstructive pulmonary disease) ([place]), Diabetes mellitus without complication ([place]), Hypercholesteremia, and Hypertension.  ? ?Surgical History:  ? ?Past Surgical History:  ?Procedure Laterality Date  ? ACHILLES TENDON REPAIR Left   ? KNEE ARTHROSCOPY Left   ? RIGHT/LEFT HEART CATH AND CORONARY ANGIOGRAPHY N/A 04/09/2019  ? Procedure: RIGHT/LEFT HEART CATH AND CORONARY ANGIOGRAPHY;  Surgeon: Martinique, Peter M, MD;  Location: [facility];  Service: Cardiovascular;  Laterality: N/A;  ? TONSILLECTOMY    ? WRIST FRACTURE SURGERY Left   ?  ? ?Social History:  ? reports that he has quit smoking. He has never been exposed to tobacco smoke. He has never used smokeless tobacco. He reports current alcohol use of about 8.0 standard drinks per week. He reports that he does not use drugs.  ? ?Family History:  ?His family history includes CAD in his mother; Heart attack in his brother and father; Leukemia in his mother.  ? ?Allergies ?Allergies  ?Allergen Reactions  ? Oxcarbazepine Other (See Comments)  ?  Dizziness/ lightheaded  ? Zolpidem Nausea Only  ?  ? ?Home  Medications  ?Prior to Admission medications   ?Medication Sig Start Date End Date Taking? Authorizing Provider  ?albuterol (PROVENTIL HFA;VENTOLIN HFA) 108 (90 BASE) MCG/ACT inhaler Inhale 2 puffs into the lungs every 6 (six) ho

## 2021-09-01 DIAGNOSIS — I5031 Acute diastolic (congestive) heart failure: Secondary | ICD-10-CM

## 2021-09-01 DIAGNOSIS — I509 Heart failure, unspecified: Secondary | ICD-10-CM | POA: Diagnosis not present

## 2021-09-01 DIAGNOSIS — J441 Chronic obstructive pulmonary disease with (acute) exacerbation: Secondary | ICD-10-CM | POA: Diagnosis not present

## 2021-09-01 DIAGNOSIS — J9601 Acute respiratory failure with hypoxia: Secondary | ICD-10-CM | POA: Diagnosis not present

## 2021-09-01 LAB — GLUCOSE, CAPILLARY
Glucose-Capillary: 214 mg/dL — ABNORMAL HIGH (ref 70–99)
Glucose-Capillary: 202 mg/dL — ABNORMAL HIGH (ref 70–99)
Glucose-Capillary: 216 mg/dL — ABNORMAL HIGH (ref 70–99)
Glucose-Capillary: 255 mg/dL — ABNORMAL HIGH (ref 70–99)

## 2021-09-01 LAB — PROCALCITONIN: Procalcitonin: 0.19 ng/mL

## 2021-09-01 MED ORDER — IPRATROPIUM BROMIDE 0.02 % IN SOLN
0.5000 mg | Freq: Four times a day (QID) | RESPIRATORY_TRACT | Status: DC | PRN
Start: 2021-09-01 — End: 2021-09-02

## 2021-09-01 MED ORDER — LEVALBUTEROL HCL 0.63 MG/3ML IN NEBU: 0.6300 mg | INHALATION_SOLUTION | Freq: Four times a day (QID) | RESPIRATORY_TRACT | Status: DC | PRN

## 2021-09-01 MED ORDER — METOPROLOL SUCCINATE ER 25 MG PO TB24
25.0000 mg | ORAL_TABLET | Freq: Every day | ORAL | Status: DC
  Filled 2021-09-01: qty 1

## 2021-09-01 MED ORDER — FUROSEMIDE 40 MG PO TABS
40.0000 mg | ORAL_TABLET | Freq: Two times a day (BID) | ORAL | Status: DC
  Administered 2021-09-01 – 2021-09-02 (×2): 40 mg via ORAL
  Filled 2021-09-01 (×2): qty 1

## 2021-09-01 MED ORDER — ALLOPURINOL 100 MG PO TABS
200.0000 mg | ORAL_TABLET | Freq: Every day | ORAL | Status: DC
  Administered 2021-09-01 – 2021-09-02 (×2): 200 mg via ORAL
  Filled 2021-09-01 (×2): qty 2

## 2021-09-01 NOTE — Progress Notes (Addendum)
? ?Progress Note ? ?Patient Name: Francis Cardenas ?Date of Encounter: 09/01/2021 ? ?[facility] Cardiologist: Fransico Him, MD  ? ?Subjective  ? ?Patient state he is doing better, less SOB and improved leg edema, has been voiding a lot. RN report patient came off BIPAP this AM and now on 1.5LNC oxygen.  ? ?Inpatient Medications  ?  ?Scheduled Meds: ? amoxicillin-clavulanate  1 tablet Oral Q12H  ? apixaban  5 mg Oral BID  ? atorvastatin  40 mg Oral Q supper  ? carvedilol  3.125 mg Oral BID WC  ? colchicine  0.6 mg Oral Daily  ? folic acid  1 mg Oral Daily  ? furosemide  40 mg Intravenous Q12H  ? gabapentin  300 mg Oral TID  ? insulin aspart  0-9 Units Subcutaneous TID WC  ? ipratropium  0.5 mg Nebulization BID  ? levalbuterol  0.63 mg Nebulization BID  ? mometasone-formoterol  2 puff Inhalation BID  ? multivitamin with minerals  1 tablet Oral Daily  ? pantoprazole  40 mg Oral Daily  ? predniSONE  60 mg Oral Daily  ? thiamine  100 mg Oral Daily  ? Or  ? thiamine  100 mg Intravenous Daily  ? vitamin B-12  500 mcg Oral Q supper  ? ?Continuous Infusions: ? ?PRN Meds: ?albuterol, LORazepam **OR** LORazepam, melatonin  ? ?Vital Signs  ?  ?Vitals:  ? 09/01/21 0741 09/01/21 0815 09/01/21 0817 09/01/21 8850  ?BP: (!) 106/57     ?Pulse: (!) 57   62  ?Resp: 16     ?Temp: (!) 97.4 ?F (36.3 ?C)     ?TempSrc: Oral     ?SpO2: 96% 96% 96%   ?Weight:      ?Height:      ? ? ?Intake/Output Summary (Last 24 hours) at 09/01/2021 1100 ?Last data filed at 09/01/2021 0451 ?Gross per 24 hour  ?Intake 1420 ml  ?Output 1700 ml  ?Net -280 ml  ? ? ?  09/01/2021  ?  4:52 AM 08/31/2021  ?  4:10 AM 08/30/2021  ?  4:45 PM  ?Last 3 Weights  ?Weight (lbs) 295 lb 3.1 oz 288 lb 9.3 oz 290 lb 9.1 oz  ?Weight (kg) 133.9 kg 130.9 kg 131.8 kg  ?   ? ?Telemetry  ?  ?A fib 50-60s  - Personally Reviewed ? ?ECG  ?  ?No new tracing today - Personally Reviewed ? ?Physical Exam  ? ?GEN: No acute distress.   ?Neck: No JVD ?Cardiac: Irregularly irregular, no murmurs,  rubs, or gallops.  ?Respiratory: Clear to auscultation bilaterally. On 1.5LNC, pox 99%  ?GI: Soft, nontender, non-distended  ?MS: Trace BLE edema; No deformity. ?Neuro:  Nonfocal  ?Psych: Normal affect  ? ?Labs  ?  ?High Sensitivity Troponin:   ?Recent Labs  ?Lab 08/30/21 ?1059 08/30/21 ?1300  ?TROPONINIHS 62* 67*  ?   ?Chemistry ?Recent Labs  ?Lab 08/30/21 ?1059 08/30/21 ?1108 08/31/21 ?0127  ?NA 139 140 139  ?K 3.8 3.8 3.5  ?CL 102  --  103  ?CO2 28  --  27  ?GLUCOSE 120*  --  166*  ?BUN 14  --  14  ?CREATININE 1.08  --  1.14  ?CALCIUM 11.0*  --  10.3  ?PROT 7.2  --   --   ?ALBUMIN 4.3  --   --   ?AST 12*  --   --   ?ALT 10  --   --   ?ALKPHOS 66  --   --   ?  BILITOT 1.1  --   --   ?GFRNONAA >60  --  >60  ?ANIONGAP 9  --  9  ?  ?Lipids No results for input(s): CHOL, TRIG, HDL, LABVLDL, LDLCALC, CHOLHDL in the last 168 hours.  ?Hematology ?Recent Labs  ?Lab 08/30/21 ?1059 08/30/21 ?1108 08/31/21 ?0127  ?WBC 6.5  --  5.5  ?RBC 3.79*  --  3.53*  3.56*  ?HGB 11.0* 10.9* 10.7*  ?HCT 35.1* 32.0* 32.5*  ?MCV 92.6  --  92.1  ?MCH 29.0  --  30.3  ?MCHC 31.3  --  32.9  ?RDW 17.5*  --  17.0*  ?PLT 143*  --  144*  ? ?Thyroid No results for input(s): TSH, FREET4 in the last 168 hours.  ?BNP ?Recent Labs  ?Lab 08/30/21 ?1059  ?BNP 529.7*  ?  ?DDimer No results for input(s): DDIMER in the last 168 hours.  ? ?Radiology  ?  ?DG CHEST PORT 1 VIEW ? ?Result Date: 08/31/2021 ?CLINICAL DATA:  Shortness of breath EXAM: PORTABLE CHEST 1 VIEW COMPARISON:  08/30/2021 FINDINGS: Transverse diameter of heart is increased. Central pulmonary vessels are less prominent. There are no new focal infiltrates or signs of pulmonary edema. There is no significant pleural effusion or pneumothorax. IMPRESSION: There is interval decrease in pulmonary vascular congestion suggesting resolving CHF. There are no signs of alveolar pulmonary edema or new focal infiltrates in the current study. Electronically Signed   By: Elmer Picker M.D.   On:  08/31/2021 15:20  ? ?ECHOCARDIOGRAM COMPLETE ? ?Result Date: 08/31/2021 ?   ECHOCARDIOGRAM REPORT   Patient Name:   Francis Cardenas Date of Exam: 08/31/2021 Medical Rec #:  [id number]      Height:       70.0 in Accession #:    [id number]     Weight:       288.6 lb Date of Birth:  [birth year]     BSA:          2.439 m? Patient Age:    71 years       BP:           112/50 mmHg Patient Gender: M              HR:           60 bpm. Exam Location:  Inpatient Procedure: 2D Echo Indications:    Dyspnea  History:        Patient has prior history of Echocardiogram examinations, most                 recent 03/17/2019. COPD; Risk Factors:Hypertension and Diabetes.  Sonographer:    Arlyss Gandy Referring Phys: Theola Sequin  Sonographer Comments: Image acquisition challenging due to patient body habitus and Image acquisition challenging due to respiratory motion. IMPRESSIONS  1. Left ventricular ejection fraction, by estimation, is 60 to 65%. The left ventricle has normal function. The left ventricle has no regional wall motion abnormalities. Left ventricular diastolic parameters are indeterminate.  2. Right ventricular systolic function is normal. The right ventricular size is mildly enlarged. There is mildly elevated pulmonary artery systolic pressure.  3. Left atrial size was mildly dilated.  4. Right atrial size was severely dilated.  5. The mitral valve is normal in structure. No evidence of mitral valve regurgitation. No evidence of mitral stenosis.  6. The aortic valve is normal in structure. Aortic valve regurgitation is not visualized. No aortic stenosis is present.  7. The inferior vena cava is  dilated in size with <50% respiratory variability, suggesting right atrial pressure of 15 mmHg. FINDINGS  Left Ventricle: Left ventricular ejection fraction, by estimation, is 60 to 65%. The left ventricle has normal function. The left ventricle has no regional wall motion abnormalities. The left ventricular internal cavity size  was normal in size. There is  no left ventricular hypertrophy. Left ventricular diastolic parameters are indeterminate. Right Ventricle: The right ventricular size is mildly enlarged. No increase in right ventricular wall thickness. Right ventricular systolic function is normal. There is mildly elevated pulmonary artery systolic pressure. The tricuspid regurgitant velocity is 2.64 m/s, and with an assumed right atrial pressure of 15 mmHg, the estimated right ventricular systolic pressure is 00.9 mmHg. Left Atrium: Left atrial size was mildly dilated. Right Atrium: Right atrial size was severely dilated. Pericardium: There is no evidence of pericardial effusion. Presence of epicardial fat layer. Mitral Valve: The mitral valve is normal in structure. No evidence of mitral valve regurgitation. No evidence of mitral valve stenosis. Tricuspid Valve: The tricuspid valve is normal in structure. Tricuspid valve regurgitation is mild . No evidence of tricuspid stenosis. Aortic Valve: The aortic valve is normal in structure. Aortic valve regurgitation is not visualized. No aortic stenosis is present. Aortic valve mean gradient measures 5.0 mmHg. Aortic valve peak gradient measures 8.6 mmHg. Aortic valve area, by VTI measures 2.37 cm?. Pulmonic Valve: The pulmonic valve was normal in structure. Pulmonic valve regurgitation is not visualized. No evidence of pulmonic stenosis. Aorta: The aortic root is normal in size and structure. Venous: The inferior vena cava is dilated in size with less than 50% respiratory variability, suggesting right atrial pressure of 15 mmHg. IAS/Shunts: No atrial level shunt detected by color flow Doppler.  LEFT VENTRICLE PLAX 2D LVIDd:         5.40 cm   Diastology LVIDs:         4.00 cm   LV e' medial:    9.46 cm/s LV PW:         1.00 cm   LV E/e' medial:  11.6 LV IVS:        1.00 cm   LV e' lateral:   12.20 cm/s LVOT diam:     2.00 cm   LV E/e' lateral: 9.0 LV SV:         72 LV SV Index:   29 LVOT  Area:     3.14 cm?  RIGHT VENTRICLE             IVC RV Basal diam:  4.30 cm     IVC diam: 2.60 cm RV Mid diam:    4.10 cm RV S prime:     13.70 cm/s TAPSE (M-mode): 2.5 cm LEFT ATRIUM             Index        RIGH

## 2021-09-01 NOTE — Progress Notes (Signed)
? ?Francis Cardenas  VEL:[id number] DOB: 04-May-1951 DOA: 08/30/2021 ?PCP: Gerome Sam, MD   ? ?Brief Narrative:  ?71 year old with a history of COPD, diastolic CHF, pulmonary hypertension, atrial fibrillation on chronic Eliquis, BPH, DM2, HTN, HLD, morbid obesity, and OSA on CPAP who presented to the [facility] ED with shortness of breath.  He was found to be tachypneic breathing 32 times per minute with a temperature of 102.3 and tachycardia at 109.  He was diagnosed with a CHF and COPD exacerbation and admitted with acute hypoxic respiratory failure.  He admitted to having recently run out of his medications.   ? ?Consultants:  ?PCCM ?Cardiology ? ?Code Status: FULL CODE ? ?DVT prophylaxis: ?Eliquis ? ?Interim Hx: ?Afebrile x24 hours.  Heart rate controlled.  Blood pressure stable.  Saturation 99% on 2-1/2 L nasal cannula.  Anxious to be discharged home.  Denies chest pain nausea vomiting or abdominal pain. ? ?Assessment & Plan: ? ?Acute exacerbation chronic diastolic CHF -pulmonary edema ?Continue Lasix with transition to oral dosing - TTE noted EF 60-65% with no WMA but severely dilated RA and mildly elevated pulmonary artery systolic pressure - remains net +175 cc since admission -clinically stabilizing ? ?Acute hypoxic respiratory failure ?Required BiPAP following admission due to severe persisting dyspnea and hypoxemia -has now been weaned to nasal cannula -due to combination of pulmonary edema/CHF exacerbation, COPD exacerbation, and probable viral URI ? ?Possible viral URI ?Influenza and COVID-negative -no convincing evidence of a bacterial pneumonia -discontinue antibiotics and short course ? ?Acute bronchospastic COPD exacerbation ?Has been dosed with IV Solu-Medrol and nebulizer therapies -weaned to prednisone which will be stopped today ? ?Paroxysmal atrial fibrillation ?On chronic Eliquis -rate controlled ? ?Morbid obesity - Body mass index is 42.36 kg/m?. ? ?HTN ?Blood pressure well  controlled at present ? ?HLD ?Continue usual statin ? ?DM2 ?Diet controlled at baseline ? ?Gout ?Continue usual home medications ? ?Anemia of chronic disease ?Hemoglobin stable ? ?Alcohol abuse ?Reportedly drinks 6-8 beers per day -no evidence of withdrawal thus far -has been counseled to limit alcohol intake to 1 or 2 drinks per day maximum ? ?Family Communication: No family present at time of exam ?Disposition: From home -desires discharge home ASAP -possible discharge home 4/13 if remains stable overnight ? ?Objective: ?Blood pressure (!) 106/57, pulse 62, temperature (!) 97.4 ?F (36.3 ?C), temperature source Oral, resp. rate 16, height '5\' 10"'$  (1.778 m), weight 133.9 kg, SpO2 96 %. ? ?Intake/Output Summary (Last 24 hours) at 09/01/2021 1008 ?Last data filed at 09/01/2021 0451 ?Gross per 24 hour  ?Intake 1420 ml  ?Output 1700 ml  ?Net -280 ml  ? ?Filed Weights  ? 08/30/21 1645 08/31/21 0410 09/01/21 0452  ?Weight: 131.8 kg 130.9 kg 133.9 kg  ? ? ?Examination: ?General: No acute respiratory distress sitting up in bedside chair ?Lungs: Poor air movement bilateral fields with mild crackles but no wheezing ?Cardiovascular: Regular rate and rhythm without murmur -distant heart sounds ?Abdomen: Morbidly obese, soft, bowel sounds positive, no rebound ?Extremities: 2+ bilateral lower extremity edema ? ?CBC: ?Recent Labs  ?Lab 08/30/21 ?1059 08/30/21 ?1108 08/31/21 ?0127  ?WBC 6.5  --  5.5  ?NEUTROABS 5.4  --   --   ?HGB 11.0* 10.9* 10.7*  ?HCT 35.1* 32.0* 32.5*  ?MCV 92.6  --  92.1  ?PLT 143*  --  144*  ? ?Basic Metabolic Panel: ?Recent Labs  ?Lab 08/30/21 ?1059 08/30/21 ?1108 08/31/21 ?0127  ?NA 139 140 139  ?K 3.8 3.8 3.5  ?CL  102  --  103  ?CO2 28  --  27  ?GLUCOSE 120*  --  166*  ?BUN 14  --  14  ?CREATININE 1.08  --  1.14  ?CALCIUM 11.0*  --  10.3  ? ?GFR: ?Estimated Creatinine Clearance: 80.7 mL/min (by C-G formula based on SCr of 1.14 mg/dL). ? ?Liver Function Tests: ?Recent Labs  ?Lab 08/30/21 ?1059  ?AST 12*  ?ALT  10  ?ALKPHOS 66  ?BILITOT 1.1  ?PROT 7.2  ?ALBUMIN 4.3  ? ? ?Coagulation Profile: ?Recent Labs  ?Lab 08/30/21 ?1059  ?INR 1.5*  ? ? ? ?HbA1C: ?Hgb A1c MFr Bld  ?Date/Time Value Ref Range Status  ?08/30/2021 07:19 PM 5.5 4.8 - 5.6 % Final  ?  Comment:  ?  (NOTE) ?Pre diabetes:          5.7%-6.4% ? ?Diabetes:              >6.4% ? ?Glycemic control for   <7.0% ?adults with diabetes ?  ?05/15/2021 03:29 AM 5.5 4.8 - 5.6 % Final  ?  Comment:  ?  (NOTE) ?Pre diabetes:          5.7%-6.4% ? ?Diabetes:              >6.4% ? ?Glycemic control for   <7.0% ?adults with diabetes ?  ? ? ?CBG: ?Recent Labs  ?Lab 08/31/21 ?0617 08/31/21 ?1140 08/31/21 ?1649 08/31/21 ?2116 09/01/21 ?1155  ?GLUCAP 166* 177* 191* 195* 214*  ? ? ?Scheduled Meds: ? amoxicillin-clavulanate  1 tablet Oral Q12H  ? apixaban  5 mg Oral BID  ? atorvastatin  40 mg Oral Q supper  ? carvedilol  3.125 mg Oral BID WC  ? colchicine  0.6 mg Oral Daily  ? folic acid  1 mg Oral Daily  ? furosemide  40 mg Intravenous Q12H  ? gabapentin  300 mg Oral TID  ? insulin aspart  0-9 Units Subcutaneous TID WC  ? ipratropium  0.5 mg Nebulization BID  ? levalbuterol  0.63 mg Nebulization BID  ? mometasone-formoterol  2 puff Inhalation BID  ? multivitamin with minerals  1 tablet Oral Daily  ? pantoprazole  40 mg Oral Daily  ? predniSONE  60 mg Oral Daily  ? thiamine  100 mg Oral Daily  ? Or  ? thiamine  100 mg Intravenous Daily  ? vitamin B-12  500 mcg Oral Q supper  ? ? ? LOS: 2 days  ? ?Cherene Altes, MD ?[facility] ?Office  [phone] ?Pager - Text Page per Shea Evans ? ?If 7PM-7AM, please contact night-coverage per Amion ?09/01/2021, 10:08 AM ? ? ? ?

## 2021-09-01 NOTE — Progress Notes (Signed)
Physical Therapy Treatment ?Patient Details ?Name: Francis Cardenas ?MRN: [id number] ?DOB: 04-Oct-1950 ?Today's Date: 09/01/2021 ? ? ?History of Present Illness Pt is 71 yo male admitted 08/30/21 [facility] with multifactorial resp failure in setting of possible viral syndrome and PNE.  Pt with hx including COPD, BPH, HLD, HTN, DM, pulmonary HTN, afib, pt reports multiple orthopedic injuries ? ?  ?PT Comments  ? ? Pt received in supine, agreeable to therapy session with encouragement and with good participation and tolerance for gait and transfer training. Pt needing up to min guard for gait without AD but mostly Supervision and cues for activity pacing. Pt weaned to RA during mobility and SpO2 WFL, DOE 2/4. Pt continues to benefit from PT services to progress toward functional mobility goals. Plan to assess stair trial next session, likely may be able to DC acute PT in 1-2 more sessions as pt making good progress toward goals.   ?Recommendations for follow up therapy are one component of a multi-disciplinary discharge planning process, led by the attending physician.  Recommendations may be updated based on patient status, additional functional criteria and insurance authorization. ? ?Follow Up Recommendations ? No PT follow up ?  ?  ?Assistance Recommended at Discharge PRN  ?Patient can return home with the following Help with stairs or ramp for entrance;A little help with walking and/or transfers;A little help with bathing/dressing/bathroom ?  ?Equipment Recommendations ? None recommended by PT  ?  ?Recommendations for Other Services   ? ? ?  ?Precautions / Restrictions Precautions ?Precautions: Fall ?Restrictions ?Weight Bearing Restrictions: No  ?  ? ?Mobility ? Bed Mobility ?Overal bed mobility: Needs Assistance ?Bed Mobility: Supine to Sit ?  ?  ?Supine to sit: HOB elevated, Supervision ?  ?  ?General bed mobility comments: no physical assist; increased time to perform ?  ? ?Transfers ?Overall transfer  level: Needs assistance ?Equipment used: None ?Transfers: Sit to/from Stand ?Sit to Stand: Supervision ?  ?  ?  ?  ?  ?General transfer comment: cues for UE placement ?  ? ?Ambulation/Gait ?Ambulation/Gait assistance: Supervision, Min guard ?Gait Distance (Feet): 470 Feet ?Assistive device: None ?Gait Pattern/deviations: Step-through pattern, Decreased stride length ?Gait velocity: decreased ?  ?  ?General Gait Details: cues to focus on breathing and activity pacing ? ? ? ? ?  ?Balance Overall balance assessment: Needs assistance ?Sitting-balance support: No upper extremity supported ?Sitting balance-Leahy Scale: Good ?  ?  ?Standing balance support: No upper extremity supported ?Standing balance-Leahy Scale: Good ?  ?  ?  ?  ?  ?  ?  ?  ?  ?  ?  ?  ?  ? ?  ?Cognition Arousal/Alertness: Awake/alert ?Behavior During Therapy: [facility] for tasks assessed/performed ?Overall Cognitive Status: Within Functional Limits for tasks assessed ?  ?  ?  ?  ?  ?General Comments: at times needs reorientation to task, tangential ?  ?  ? ?  ?Exercises   ? ?  ?General Comments General comments (skin integrity, edema, etc.): SpO2 97-100% on RA with exertion (received on 1L on 100%); DOE 2/4 with longer hallway gait trial; HR 61 bpm sitting/standing EOB ?  ?  ? ?Pertinent Vitals/Pain Pain Assessment ?Pain Assessment: Faces ?Faces Pain Scale: Hurts a little bit ?Pain Location: L groin and testicle chafing/discomfort ?Pain Descriptors / Indicators: Discomfort, Tender ?Pain Intervention(s): Monitored during session, Repositioned (applied barrier ointment and pillowcase between area of skin folds for pt comfort while sitting in chair, so pt can  readjust region if uncomfortable)  ? ? ?Home Living   ?Prior Function   ? ?PT Goals (current goals can now be found in the care plan section) Acute Rehab PT Goals ?Patient Stated Goal: return home ?PT Goal Formulation: With patient ?Time For Goal Achievement: 09/14/21 ?Progress towards PT goals:  Progressing toward goals ? ?  ?Frequency ? ? ? Min 3X/week ? ? ? ?  ?PT Plan Current plan remains appropriate  ? ? ?   ?AM-PAC PT "6 Clicks" Mobility   ?Outcome Measure ? Help needed turning from your back to your side while in a flat bed without using bedrails?: None ?Help needed moving from lying on your back to sitting on the side of a flat bed without using bedrails?: None ?Help needed moving to and from a bed to a chair (including a wheelchair)?: A Little ?Help needed standing up from a chair using your arms (e.g., wheelchair or bedside chair)?: A Little ?Help needed to walk in hospital room?: A Little ?Help needed climbing 3-5 steps with a railing? : A Little ?6 Click Score: 20 ? ?  ?End of Session Equipment Utilized During Treatment: Gait belt ?Activity Tolerance: Patient tolerated treatment well ?Patient left: in chair;with call bell/phone within reach;Other (comment) (per RN OK to leave chair alarm off, pt aware of need for call bell) ?Nurse Communication: Mobility status;Other (comment) (pt c/o chafing in L perineal/groin area) ?PT Visit Diagnosis: Other abnormalities of gait and mobility (R26.89);History of falling (Z91.81) ?  ? ? ?Time: 1703-1730 ?PT Time Calculation (min) (ACUTE ONLY): 27 min ? ?Charges:  $Gait Training: 8-22 mins ?$Therapeutic Activity: 8-22 mins          ?          ? ?Shatira Dobosz P., PTA ?Acute Rehabilitation Services ?Secure Chat Preferred 9a-5:30pm ?Office: [phone]  ? ? ?Foch Rosenwald M Olando Willems ?09/01/2021, 6:03 PM ? ?

## 2021-09-01 NOTE — Progress Notes (Signed)
? ?NAME:  Francis Cardenas, MRN:  [id number], DOB:  05-Jun-1950, LOS: 2 ?ADMISSION DATE:  08/30/2021, CONSULTATION DATE:  08/30/21  ?REFERRING MD:  Karleen Hampshire (hospitalist) CHIEF COMPLAINT:  shortness of breath ? ?History of Present Illness:  ?Francis Cardenas is a 71 yo man with a hx of COPD, BPH, HLD, HTN, DM here with dyspnea.  Was admitted to hospitalist team today from [facility] ED.  ?Dyspnea x 3-4 days, rigors 2-3 nights as well as subjective fever during day.   ?B LE edema also noted by pt.  ?Normally uses CPAP at night, sleeps in recliner.   ?Normally drinks about 6-8 beers per day, none since Saturday.   ? ?In ED was satting in high 90s on 2L [place].  ?Received 1 L bolus around 12.   ?Tylenol, abtx started.   ?Lasix 40 IV at 1230, again at 630.  ?Febrile 102.3 initially.  ?  ?Xopenex and atrovent q6 prn.  ?Started on CAP abx.   ?On arrival here, was moving around in bed and suddenly became very wheezy and tachypneic.  Started on bipap to help with WOB, given nebs, solumedrol and lasix.  ?Feels improved now but still not "out of the woods".   ? ? ?Pertinent  Medical History  ?COPD ?BPH, prostate CA ?HLD  ?HTN ?DM ?Pulm HTN ?Afib on eliquis  ? ?Home meds: abluterol, dulera, mvi, eliquis, lipitor, coreg, colchicine, folic acid, thiamine, B16, lasix, sprinolactone, tgabapentin, novolog, lisinopril, magnesium, melatonin,  ? ?Echo 2020:  ? Left Ventricle: Left ventricular ejection fraction, by visual estimation,  ?is 50%. The left ventricle has normal function. There is no left  ?ventricular hypertrophy. Normal left ventricular size. Spectral Doppler  ?shows Left ventricular diastolic Doppler  ?parameters are indeterminate pattern of LV diastolic filling. ?Significant Hospital Events: ?Including procedures, antibiotic start and stop dates in addition to other pertinent events   ? ? ?Interim History / Subjective:  ?Overall improved ?Most notable intervention appears to be diuresis, I/O- 525 cc total ?Was tolerating room  air intermittently this morning, currently on 1.5 L/min ?Wore BiPAP reliably overnight, uses CPAP reliably at home ? ?Objective   ?Blood pressure (!) 117/51, pulse (!) 59, temperature 97.9 ?F (36.6 ?C), temperature source Oral, resp. rate 18, height '5\' 10"'$  (1.778 m), weight 133.9 kg, SpO2 98 %. ?   ?   ? ?Intake/Output Summary (Last 24 hours) at 09/01/2021 1343 ?Last data filed at 09/01/2021 1114 ?Gross per 24 hour  ?Intake 1080 ml  ?Output 2400 ml  ?Net -1320 ml  ? ? ?Filed Weights  ? 08/30/21 1645 08/31/21 0410 09/01/21 0452  ?Weight: 131.8 kg 130.9 kg 133.9 kg  ? ? ?Examination: ?General: Obese man, up to chair, comfortable ?HENT: OP clear, no stridor, strong voice ?Lungs: Decreased to both bases, no wheezes or crackles ?Cardiovascular: Trace to no edema, improved.  Irregular, no murmur ?Abdomen: Obese, nondistended with positive bowel sounds ?Extremities: No deformity ?Neuro: Alert and awake, appropriate, a bit tangential but redirects ? ?Resolved Hospital Problem list   ? ? ?Assessment & Plan:  ?Multifactorial respiratory failure in setting of of possible viral syndrome at presentation, chills, fever, LE edema.  COVID and flu negative.  Chest x-ray with bilateral vascular prominence consistent with evolving diastolic CHF (HTN, A fib) especially in light of his lower extremity edema.  Suspect some of his wheezing may have been related to some volume overload.  Question whether viral process tipped him over into acute diastolic CHF.  He required transient BiPAP  urgently 2 nights ago, improved at this time. ?-Appreciate cardiology management.  Remains on scheduled diuretics, carvedilol, Eliquis with echocardiogram pending. ?-Would wean prednisone quickly to off in absence of residual wheezing ?-Continue Dulera.  He prefers this at home but the [place] is currently giving him Wixela.  He will probably need to clarify this with his [place] physicians to change the order over to [facility]. ?-Complete 7 days antibiotics total  although presentation inconsistent with CAP ?-Mandatory BiPAP or CPAP nightly ?-Wean oxygen as able, is on room air at home ?-Mobilize ? ?Please call if we can assist further ? ?Labs   ?CBC: ?Recent Labs  ?Lab 08/30/21 ?1059 08/30/21 ?1108 08/31/21 ?0127  ?WBC 6.5  --  5.5  ?NEUTROABS 5.4  --   --   ?HGB 11.0* 10.9* 10.7*  ?HCT 35.1* 32.0* 32.5*  ?MCV 92.6  --  92.1  ?PLT 143*  --  144*  ? ? ? ?Basic Metabolic Panel: ?Recent Labs  ?Lab 08/30/21 ?1059 08/30/21 ?1108 08/31/21 ?0127  ?NA 139 140 139  ?K 3.8 3.8 3.5  ?CL 102  --  103  ?CO2 28  --  27  ?GLUCOSE 120*  --  166*  ?BUN 14  --  14  ?CREATININE 1.08  --  1.14  ?CALCIUM 11.0*  --  10.3  ? ? ?GFR: ?Estimated Creatinine Clearance: 80.7 mL/min (by C-G formula based on SCr of 1.14 mg/dL). ?Recent Labs  ?Lab 08/30/21 ?1059 08/30/21 ?1300 08/30/21 ?2100 08/31/21 ?0127 09/01/21 ?0224  ?PROCALCITON  --   --  <0.10 0.13 0.19  ?WBC 6.5  --   --  5.5  --   ?LATICACIDVEN 0.9 0.8  --   --   --   ? ? ? ?Liver Function Tests: ?Recent Labs  ?Lab 08/30/21 ?1059  ?AST 12*  ?ALT 10  ?ALKPHOS 66  ?BILITOT 1.1  ?PROT 7.2  ?ALBUMIN 4.3  ? ? ?No results for input(s): LIPASE, AMYLASE in the last 168 hours. ?No results for input(s): AMMONIA in the last 168 hours. ? ?ABG ?   ?Component Value Date/Time  ? PHART 7.5 (H) 08/30/2021 1855  ? PCO2ART 38 08/30/2021 1855  ? PO2ART 103 08/30/2021 1855  ? HCO3 29.6 (H) 08/30/2021 1855  ? TCO2 32 08/30/2021 1108  ? ACIDBASEDEF 1.0 12/10/2018 1813  ? O2SAT 99 08/30/2021 1855  ? ?  ? ?Coagulation Profile: ?Recent Labs  ?Lab 08/30/21 ?1059  ?INR 1.5*  ? ? ? ?Cardiac Enzymes: ?No results for input(s): CKTOTAL, CKMB, CKMBINDEX, TROPONINI in the last 168 hours. ? ?HbA1C: ?Hgb A1c MFr Bld  ?Date/Time Value Ref Range Status  ?08/30/2021 07:19 PM 5.5 4.8 - 5.6 % Final  ?  Comment:  ?  (NOTE) ?Pre diabetes:          5.7%-6.4% ? ?Diabetes:              >6.4% ? ?Glycemic control for   <7.0% ?adults with diabetes ?  ?05/15/2021 03:29 AM 5.5 4.8 - 5.6 % Final   ?  Comment:  ?  (NOTE) ?Pre diabetes:          5.7%-6.4% ? ?Diabetes:              >6.4% ? ?Glycemic control for   <7.0% ?adults with diabetes ?  ? ? ?CBG: ?Recent Labs  ?Lab 08/31/21 ?1140 08/31/21 ?1649 08/31/21 ?2116 09/01/21 ?5102 09/01/21 ?1112  ?GLUCAP 177* 191* 195* 214* 202*  ? ? ? ?Critical care time: NA ?  ? ? ?  Baltazar Apo, MD, PhD ?09/01/2021, 1:43 PM ?[facility] ?[phone] or if no answer before 7:00PM call [phone] ?For any issues after 7:00PM please call eLink [phone] ? ? ? ?

## 2021-09-02 DIAGNOSIS — E1169 Type 2 diabetes mellitus with other specified complication: Secondary | ICD-10-CM

## 2021-09-02 DIAGNOSIS — E669 Obesity, unspecified: Secondary | ICD-10-CM

## 2021-09-02 DIAGNOSIS — I5031 Acute diastolic (congestive) heart failure: Secondary | ICD-10-CM | POA: Diagnosis not present

## 2021-09-02 DIAGNOSIS — J9601 Acute respiratory failure with hypoxia: Secondary | ICD-10-CM | POA: Diagnosis not present

## 2021-09-02 DIAGNOSIS — J441 Chronic obstructive pulmonary disease with (acute) exacerbation: Secondary | ICD-10-CM | POA: Diagnosis not present

## 2021-09-02 LAB — MAGNESIUM: Magnesium: 2.4 mg/dL (ref 1.7–2.4)

## 2021-09-02 LAB — GLUCOSE, CAPILLARY
Glucose-Capillary: 171 mg/dL — ABNORMAL HIGH (ref 70–99)
Glucose-Capillary: 196 mg/dL — ABNORMAL HIGH (ref 70–99)
Glucose-Capillary: 115 mg/dL — ABNORMAL HIGH (ref 70–99)

## 2021-09-02 LAB — BASIC METABOLIC PANEL WITH GFR
Anion gap: 7 (ref 5–15)
BUN: 35 mg/dL — ABNORMAL HIGH (ref 8–23)
CO2: 27 mmol/L (ref 22–32)
Calcium: 9.9 mg/dL (ref 8.9–10.3)
Chloride: 101 mmol/L (ref 98–111)
Creatinine, Ser: 1.04 mg/dL (ref 0.61–1.24)
GFR, Estimated: >60 mL/min
Glucose, Bld: 216 mg/dL — ABNORMAL HIGH (ref 70–99)
Potassium: 3.4 mmol/L — ABNORMAL LOW (ref 3.5–5.1)
Sodium: 135 mmol/L (ref 135–145)

## 2021-09-02 MED ORDER — METOPROLOL SUCCINATE ER 25 MG PO TB24: 12.5000 mg | ORAL_TABLET | Freq: Every day | ORAL | Status: DC

## 2021-09-02 MED ORDER — POTASSIUM CHLORIDE CRYS ER 20 MEQ PO TBCR
40.0000 meq | EXTENDED_RELEASE_TABLET | Freq: Once | ORAL | Status: AC
  Administered 2021-09-02: 40 meq via ORAL
  Filled 2021-09-02: qty 2

## 2021-09-02 MED ORDER — METOPROLOL SUCCINATE ER 25 MG PO TB24: 12.5000 mg | ORAL_TABLET | Freq: Every day | ORAL | 1 refills | Status: DC

## 2021-09-02 NOTE — Progress Notes (Signed)
Patient wants to follow up with Dr Harrington Challenger cardiology in the future and switch from [place], appointment made on 09/27/21 with [address] office ? ?

## 2021-09-02 NOTE — Progress Notes (Signed)
? ?Progress Note ? ?Patient Name: Francis Cardenas ?Date of Encounter: 09/02/2021 ? ?[facility] Cardiologist: Fransico Him, MD  ? ?Subjective  ? ?Pt is comfortable  No CP  Breathing is OK  ? ?Inpatient Medications  ?  ?Scheduled Meds: ? allopurinol  200 mg Oral Daily  ? amoxicillin-clavulanate  1 tablet Oral Q12H  ? apixaban  5 mg Oral BID  ? atorvastatin  40 mg Oral Q supper  ? colchicine  0.6 mg Oral Daily  ? folic acid  1 mg Oral Daily  ? furosemide  40 mg Oral BID  ? gabapentin  300 mg Oral TID  ? insulin aspart  0-9 Units Subcutaneous TID WC  ? metoprolol succinate  25 mg Oral Daily  ? mometasone-formoterol  2 puff Inhalation BID  ? multivitamin with minerals  1 tablet Oral Daily  ? pantoprazole  40 mg Oral Daily  ? thiamine  100 mg Oral Daily  ? vitamin B-12  500 mcg Oral Q supper  ? ?Continuous Infusions: ? ?PRN Meds: ?ipratropium, levalbuterol, melatonin  ? ?Vital Signs  ?  ?Vitals:  ? 09/02/21 0404 09/02/21 0500 09/02/21 0739 09/02/21 0803  ?BP: 122/60   136/79  ?Pulse: (!) 52   62  ?Resp: 17   16  ?Temp: (!) 97.4 ?F (36.3 ?C)   97.6 ?F (36.4 ?C)  ?TempSrc: Axillary   Oral  ?SpO2: 100%  99% 96%  ?Weight:  131.4 kg    ?Height:      ? ? ?Intake/Output Summary (Last 24 hours) at 09/02/2021 0804 ?Last data filed at 09/02/2021 0804 ?Gross per 24 hour  ?Intake 120 ml  ?Output 1750 ml  ?Net -1630 ml  ? ? ?  09/02/2021  ?  5:00 AM 09/01/2021  ?  4:52 AM 08/31/2021  ?  4:10 AM  ?Last 3 Weights  ?Weight (lbs) 289 lb 11 oz 295 lb 3.1 oz 288 lb 9.3 oz  ?Weight (kg) 131.4 kg 133.9 kg 130.9 kg  ?   ? ?Telemetry  ?  ?A fib 50-60s  - Personally Reviewed ? ?ECG  ?  ?No new tracing today - Personally Reviewed ? ?Physical Exam  ? ?GEN: No acute distress.   ?Neck: JVP is normal  ?Cardiac: Irregularly irregular, no murmurs, rubs, or gallops.  ?Respiratory: Clear to auscultation bilaterally. On 1.5LNC, pox 99%  ?GI: Soft, nontender, non-distended  ?MS: Trace BLE edema; No deformity. ?Neuro:  Nonfocal  ?Psych: Normal affect  ? ?Labs   ?  ?High Sensitivity Troponin:   ?Recent Labs  ?Lab 08/30/21 ?1059 08/30/21 ?1300  ?TROPONINIHS 62* 67*  ?   ?Chemistry ?Recent Labs  ?Lab 08/30/21 ?1059 08/30/21 ?1108 08/31/21 ?0127 09/02/21 ?0219  ?NA 139 140 139 135  ?K 3.8 3.8 3.5 3.4*  ?CL 102  --  103 101  ?CO2 28  --  27 27  ?GLUCOSE 120*  --  166* 216*  ?BUN 14  --  14 35*  ?CREATININE 1.08  --  1.14 1.04  ?CALCIUM 11.0*  --  10.3 9.9  ?MG  --   --   --  2.4  ?PROT 7.2  --   --   --   ?ALBUMIN 4.3  --   --   --   ?AST 12*  --   --   --   ?ALT 10  --   --   --   ?ALKPHOS 66  --   --   --   ?BILITOT 1.1  --   --   --   ?  GFRNONAA >60  --  >60 >60  ?ANIONGAP 9  --  9 7  ?  ?Lipids No results for input(s): CHOL, TRIG, HDL, LABVLDL, LDLCALC, CHOLHDL in the last 168 hours.  ?Hematology ?Recent Labs  ?Lab 08/30/21 ?1059 08/30/21 ?1108 08/31/21 ?0127  ?WBC 6.5  --  5.5  ?RBC 3.79*  --  3.53*  3.56*  ?HGB 11.0* 10.9* 10.7*  ?HCT 35.1* 32.0* 32.5*  ?MCV 92.6  --  92.1  ?MCH 29.0  --  30.3  ?MCHC 31.3  --  32.9  ?RDW 17.5*  --  17.0*  ?PLT 143*  --  144*  ? ?Thyroid No results for input(s): TSH, FREET4 in the last 168 hours.  ?BNP ?Recent Labs  ?Lab 08/30/21 ?1059  ?BNP 529.7*  ?  ?DDimer No results for input(s): DDIMER in the last 168 hours.  ? ?Radiology  ?  ?DG CHEST PORT 1 VIEW ? ?Result Date: 08/31/2021 ?CLINICAL DATA:  Shortness of breath EXAM: PORTABLE CHEST 1 VIEW COMPARISON:  08/30/2021 FINDINGS: Transverse diameter of heart is increased. Central pulmonary vessels are less prominent. There are no new focal infiltrates or signs of pulmonary edema. There is no significant pleural effusion or pneumothorax. IMPRESSION: There is interval decrease in pulmonary vascular congestion suggesting resolving CHF. There are no signs of alveolar pulmonary edema or new focal infiltrates in the current study. Electronically Signed   By: Elmer Picker M.D.   On: 08/31/2021 15:20  ? ?ECHOCARDIOGRAM COMPLETE ? ?Result Date: 08/31/2021 ?   ECHOCARDIOGRAM REPORT   Patient  Name:   Francis Cardenas Date of Exam: 08/31/2021 Medical Rec #:  [id number]      Height:       70.0 in Accession #:    [id number]     Weight:       288.6 lb Date of Birth:  Jul 26, 1950     BSA:          2.439 m? Patient Age:    71 years       BP:           112/50 mmHg Patient Gender: M              HR:           60 bpm. Exam Location:  Inpatient Procedure: 2D Echo Indications:    Dyspnea  History:        Patient has prior history of Echocardiogram examinations, most                 recent 03/17/2019. COPD; Risk Factors:Hypertension and Diabetes.  Sonographer:    Arlyss Gandy Referring Phys: Theola Sequin  Sonographer Comments: Image acquisition challenging due to patient body habitus and Image acquisition challenging due to respiratory motion. IMPRESSIONS  1. Left ventricular ejection fraction, by estimation, is 60 to 65%. The left ventricle has normal function. The left ventricle has no regional wall motion abnormalities. Left ventricular diastolic parameters are indeterminate.  2. Right ventricular systolic function is normal. The right ventricular size is mildly enlarged. There is mildly elevated pulmonary artery systolic pressure.  3. Left atrial size was mildly dilated.  4. Right atrial size was severely dilated.  5. The mitral valve is normal in structure. No evidence of mitral valve regurgitation. No evidence of mitral stenosis.  6. The aortic valve is normal in structure. Aortic valve regurgitation is not visualized. No aortic stenosis is present.  7. The inferior vena cava is dilated in size with <50% respiratory variability,  suggesting right atrial pressure of 15 mmHg. FINDINGS  Left Ventricle: Left ventricular ejection fraction, by estimation, is 60 to 65%. The left ventricle has normal function. The left ventricle has no regional wall motion abnormalities. The left ventricular internal cavity size was normal in size. There is  no left ventricular hypertrophy. Left ventricular diastolic parameters are  indeterminate. Right Ventricle: The right ventricular size is mildly enlarged. No increase in right ventricular wall thickness. Right ventricular systolic function is normal. There is mildly elevated pulmonary artery systolic pressure. The tricuspid regurgitant velocity is 2.64 m/s, and with an assumed right atrial pressure of 15 mmHg, the estimated right ventricular systolic pressure is 78.2 mmHg. Left Atrium: Left atrial size was mildly dilated. Right Atrium: Right atrial size was severely dilated. Pericardium: There is no evidence of pericardial effusion. Presence of epicardial fat layer. Mitral Valve: The mitral valve is normal in structure. No evidence of mitral valve regurgitation. No evidence of mitral valve stenosis. Tricuspid Valve: The tricuspid valve is normal in structure. Tricuspid valve regurgitation is mild . No evidence of tricuspid stenosis. Aortic Valve: The aortic valve is normal in structure. Aortic valve regurgitation is not visualized. No aortic stenosis is present. Aortic valve mean gradient measures 5.0 mmHg. Aortic valve peak gradient measures 8.6 mmHg. Aortic valve area, by VTI measures 2.37 cm?. Pulmonic Valve: The pulmonic valve was normal in structure. Pulmonic valve regurgitation is not visualized. No evidence of pulmonic stenosis. Aorta: The aortic root is normal in size and structure. Venous: The inferior vena cava is dilated in size with less than 50% respiratory variability, suggesting right atrial pressure of 15 mmHg. IAS/Shunts: No atrial level shunt detected by color flow Doppler.  LEFT VENTRICLE PLAX 2D LVIDd:         5.40 cm   Diastology LVIDs:         4.00 cm   LV e' medial:    9.46 cm/s LV PW:         1.00 cm   LV E/e' medial:  11.6 LV IVS:        1.00 cm   LV e' lateral:   12.20 cm/s LVOT diam:     2.00 cm   LV E/e' lateral: 9.0 LV SV:         72 LV SV Index:   29 LVOT Area:     3.14 cm?  RIGHT VENTRICLE             IVC RV Basal diam:  4.30 cm     IVC diam: 2.60 cm RV Mid  diam:    4.10 cm RV S prime:     13.70 cm/s TAPSE (M-mode): 2.5 cm LEFT ATRIUM             Index        RIGHT ATRIUM           Index LA diam:        4.10 cm 1.68 cm/m?   RA Area:     28.90 cm? LA Vol (A2C):

## 2021-09-02 NOTE — Progress Notes (Signed)
Heart Failure Pharmacist Progress Note  ? ?REDS Clip  READING= 29% (normal 25-35%) ? ?CHEST RULER = 41 ?Clip Station = D ? ?Kerby Nora, PharmD, BCPS ?Heart Failure Stewardship Pharmacist ?Phone [phone] ? ?Please check [email or web address] for unit-specific pharmacist phone numbers ? ?

## 2021-09-02 NOTE — TOC Transition Note (Signed)
Transition of Care (TOC) - CM/SW Discharge Note ?Marvetta Gibbons Therapist, sports, BSN ?Transitions of Care ?Unit 4E- RN Case Manager ?See Treatment Team for direct phone #  ? ? ?Patient Details  ?Name: Francis Cardenas ?MRN: [id number] ?Date of Birth: July 12, 1950 ? ?Transition of Care (TOC) CM/SW Contact:  ?Dahlia Client, Romeo Rabon, RN ?Phone Number: ?09/02/2021, 11:33 AM ? ? ?Clinical Narrative:    ?Pt stable for transition home today, Transition of Care Department [facility]) has reviewed patient and no TOC needs have been identified at this time. Pt to return home w/ wife. ? ?Final next level of care: Home/Self Care ?Barriers to Discharge: No Barriers Identified ? ? ?Patient Goals and CMS Choice ?  ?  ?Choice offered to / list presented to : NA ? ?Discharge Placement ?  ?           ? Home ?  ?  ?  ? ?Discharge Plan and Services ?  ?  ?           ?DME Arranged: N/A ?DME Agency: NA ?  ?  ?  ?HH Arranged: NA ?[place] Agency: NA ?  ?  ?  ? ?Social Determinants of Health (SDOH) Interventions ?  ? ? ?Readmission Risk Interventions ? ?  09/02/2021  ? 11:32 AM 03/21/2019  ? 10:59 AM 03/21/2019  ? 10:58 AM  ?Readmission Risk Prevention Plan  ?Transportation Screening Complete  Complete  ?[place] or Home Care Consult Complete  Complete  ?Social Work Consult for [place] Planning/Counseling   Patient refused  ?Palliative Care Screening Not Applicable  Not Applicable  ?Medication Review (RN Care Manager) Complete Referral to Pharmacy Complete  ? ? ? ? ? ?

## 2021-09-02 NOTE — Progress Notes (Signed)
SATURATION QUALIFICATIONS: (This note is used to comply with regulatory documentation for home oxygen) ? ?Patient Saturations on Room Air at Rest = 100% ? ?Patient Saturations on Room Air while Ambulating = 95% ? ?Patient Saturations on N/A Liters of oxygen while Ambulating = N/A ? ?Please briefly explain why patient needs home oxygen: N/A ?

## 2021-09-02 NOTE — Progress Notes (Signed)
Patient discharging home with support of wife. Ivs removed without complications. Tele removed and CCMD notified. Discharge instructions given and medication administration discussed. All questions answered.  ?Francis Cardenas  ?

## 2021-09-02 NOTE — Progress Notes (Signed)
Mobility Specialist Progress Note ? ? 09/02/21 1130  ?Mobility  ?Activity Ambulated independently in hallway  ?Level of Assistance Independent after set-up  ?Assistive Device None  ?Distance Ambulated (ft) 470 ft  ?Activity Response Tolerated well  ?$Mobility charge 1 Mobility  ? ?Pre Mobility: 93 HR, 121/63 BP, 100% SpO2 ?During Mobility: 98 HR, BP, 95% SpO2 ?Post Mobility: 91  HR, 152/85 BP, 98% SpO2 ? ?Received pt in chair having no complaints and agreeable to mobility. Asymptomatic throughout ambulation w/ SpO2 ranging from 94% -100%, returned back to chair w/o fault and call bell in reach. ? ?Holland Falling ?Mobility Specialist ?Phone Number [phone] ? ?

## 2021-09-02 NOTE — Discharge Summary (Signed)
?DISCHARGE SUMMARY ? ?Francis Cardenas ? ?MR#: [id number] ? ?DOB:07-15-1950  ?Date of Admission: 08/30/2021 ?Date of Discharge: 09/02/2021 ? ?Attending Physician:Kitt Minardi Hennie Duos, MD ? ?Patient's YQM:[id number], Vinnie Level, MD ? ?Consults: ?PCCM ?Cardiology ? ?Disposition: D/C home  ? ?Follow-up Appts: ? Follow-up Information   ? ? Gerome Sam, MD. Schedule an appointment as soon as possible for a visit in 1 week(s).   ?Specialty: Internal Medicine ?Contact information: ?[place] [address] ?[phone] ? ? ?  ?  ? ? Sueanne Margarita, MD .   ?Specialty: Cardiology ?Contact information: ?[address] ?[address] ?[place] [address] ?[phone] ? ? ?  ?  ? ?  ?  ? ?  ? ? ?Tests Needing Follow-up: ?-recheck renal function and determine if ACEi can be safely restarted ?-check K+ on diuretic and KCl replacement ?-assess HR with change from Coreg to Toprol XL ? ?Discharge Diagnoses: ?Acute exacerbation chronic diastolic CHF ?Pulmonary edema ?Acute hypoxic respiratory failure ?Possible viral URI ?Acute bronchospastic COPD exacerbation ?Paroxysmal atrial fibrillation ?Morbid obesity - Body mass index is 41.57 kg/m?. ?HTN ?HLD ?DM2 ?Gout ?Anemia of chronic disease ?Alcohol abuse ?  ? ?Initial presentation: ?71yo with a history of COPD, diastolic CHF, pulmonary hypertension, atrial fibrillation on chronic Eliquis, BPH, DM2, HTN, HLD, morbid obesity, and OSA on CPAP who presented to the [place] [facility] ED with shortness of breath.  He was found to be tachypneic breathing 32 times per minute with a temperature of 102.3 and tachycardia at 109.  He was diagnosed with a CHF and COPD exacerbation and admitted with acute hypoxic respiratory failure.  He admitted to having recently run out of his medications.   ? ?Hospital Course: ? ?Acute exacerbation chronic diastolic CHF -pulmonary edema ?Continued Lasix with transition to oral dosing before d/c - TTE noted EF 60-65% with no WMA but severely  dilated RA and mildly elevated pulmonary artery systolic pressure -net negative approximately 1800 since admission - clinically stable at time of discharge with no dyspnea on exertion or rest ?  ?Acute hypoxic respiratory failure ?Required BiPAP following admission due to severe persisting dyspnea and hypoxemia -has been weaned to room air after major diuresis with saturations 96% - due to combination of pulmonary edema/CHF exacerbation, COPD exacerbation, and probable viral URI ?  ?Possible viral URI ?Influenza and COVID-negative -no convincing evidence of a bacterial pneumonia -antibiotic discontinued at time of discharge ?  ?Acute bronchospastic COPD exacerbation ?Has been dosed with IV Solu-Medrol and nebulizer therapies -weaned to prednisone which was stopped in short course ?  ?Paroxysmal atrial fibrillation ?On chronic Eliquis -rate controlled with some bradycardia necessitating holding of BB ?  ?Morbid obesity - Body mass index is 41.57 kg/m?. ?  ?HTN ?Blood pressure well controlled at time of discharge ?  ?HLD ?Continue usual statin ?  ?DM2 ?Diet controlled at baseline ?  ?Gout ?Continue usual home medications ?  ?Anemia of chronic disease ?Hemoglobin stable ?  ?Alcohol abuse ?Reportedly drinks 6-8 beers per day -no evidence of withdrawal thus far -has been counseled to limit alcohol intake to 1 or 2 drinks per day maximum ?  ? ?Allergies as of 09/02/2021   ? ?   Reactions  ? Oxcarbazepine Other (See Comments)  ? Dizziness/ lightheaded  ? Zolpidem Nausea Only  ? ?  ? ?  ?Medication List  ?  ? ?STOP taking these medications   ? ?carvedilol 3.125 MG tablet ?Commonly known as: COREG ?  ?lisinopril 10 MG tablet ?  Commonly known as: ZESTRIL ?  ?melatonin 3 MG Tabs tablet ?  ? ?  ? ?TAKE these medications   ? ?albuterol 108 (90 Base) MCG/ACT inhaler ?Commonly known as: VENTOLIN HFA ?Inhale 2 puffs into the lungs every 6 (six) hours as needed for wheezing. ?  ?allopurinol 100 MG tablet ?Commonly known as:  ZYLOPRIM ?Take 200 mg by mouth daily. ?  ?ALPRAZolam 1 MG 24 hr tablet ?Commonly known as: XANAX XR ?Take 1 mg by mouth at bedtime. ?  ?apixaban 5 MG Tabs tablet ?Commonly known as: ELIQUIS ?Take 1 tablet (5 mg total) by mouth 2 (two) times daily. ?  ?atorvastatin 80 MG tablet ?Commonly known as: LIPITOR ?Take 40 mg by mouth daily with supper. ?  ?Carboxymethylcellulose Sodium 0.25 % Soln ?Place 1 drop into both eyes 3 (three) times daily as needed (dry eyes/ irritation). ?  ?colchicine 0.6 MG tablet ?Take 1 tablet (0.6 mg total) by mouth daily. ?  ?diclofenac Sodium 1 % Gel ?Commonly known as: VOLTAREN ?Apply 2 g topically 4 (four) times daily. ?  ?Ensure Max Protein Liqd ?Take 330 mLs (11 oz total) by mouth 2 (two) times daily. ?What changed:  ?when to take this ?reasons to take this ?  ?folic acid 1 MG tablet ?Commonly known as: FOLVITE ?Take 1 tablet (1 mg total) by mouth daily. ?  ?furosemide 40 MG tablet ?Commonly known as: Lasix ?Take 1 tablet (40 mg total) by mouth 2 (two) times daily. ?  ?gabapentin 300 MG capsule ?Commonly known as: NEURONTIN ?Take 300 mg by mouth 3 (three) times daily. ?  ?guaifenesin 400 MG Tabs tablet ?Commonly known as: HUMIBID E ?Take 400 mg by mouth every morning. ?  ?Magnesium Oxide 420 MG Tabs ?Take 420 mg by mouth daily with supper. ?  ?metoprolol succinate 25 MG 24 hr tablet ?Commonly known as: TOPROL-XL ?Take 0.5 tablets (12.5 mg total) by mouth daily. ?Start taking on: September 04, 2021 ?  ?mometasone-formoterol 100-5 MCG/ACT Aero ?Commonly known as: DULERA ?Inhale 2 puffs into the lungs 2 (two) times daily. ?  ?multivitamin with minerals tablet ?Take 1 tablet by mouth daily with lunch. ?  ?omeprazole 20 MG capsule ?Commonly known as: PRILOSEC ?Take 20 mg by mouth daily with supper. ?  ?potassium chloride SA 20 MEQ tablet ?Commonly known as: KLOR-CON M ?Take 1 tablet (20 mEq total) by mouth daily with supper. ?  ?spironolactone 25 MG tablet ?Commonly known as: ALDACTONE ?Take 25  mg by mouth every morning. ?  ?thiamine 100 MG tablet ?Take 1 tablet (100 mg total) by mouth daily. ?What changed: additional instructions ?  ?vitamin B-12 500 MCG tablet ?Commonly known as: CYANOCOBALAMIN ?Take 500 mcg by mouth daily with supper. ?  ?VITAMIN D3 PO ?Take 1 tablet by mouth daily with supper. ?  ? ?  ? ? ?Day of Discharge ?BP 136/79 (BP Location: Right Arm)   Pulse 62   Temp 97.6 ?F (36.4 ?C) (Oral)   Resp 16   Ht '5\' 10"'$  (1.778 m)   Wt 131.4 kg   SpO2 96%   BMI 41.57 kg/m?  ? ?Physical Exam: ?General: No acute respiratory distress ?Lungs: Clear to auscultation bilaterally without wheezes or crackles ?Cardiovascular: Regular rate and rhythm without murmur  -heart sounds distant ?Abdomen: Morbidly obese, soft, bowel sounds positive ?Extremities: 1+ bilateral lower extremity edema ? ?Basic Metabolic Panel: ?Recent Labs  ?Lab 08/30/21 ?1059 08/30/21 ?1108 08/31/21 ?0127 09/02/21 ?0219  ?NA 139 140 139 135  ?K 3.8 3.8  3.5 3.4*  ?CL 102  --  103 101  ?CO2 28  --  27 27  ?GLUCOSE 120*  --  166* 216*  ?BUN 14  --  14 35*  ?CREATININE 1.08  --  1.14 1.04  ?CALCIUM 11.0*  --  10.3 9.9  ?MG  --   --   --  2.4  ? ? ?Liver Function Tests: ?Recent Labs  ?Lab 08/30/21 ?1059  ?AST 12*  ?ALT 10  ?ALKPHOS 66  ?BILITOT 1.1  ?PROT 7.2  ?ALBUMIN 4.3  ? ? ?CBC: ?Recent Labs  ?Lab 08/30/21 ?1059 08/30/21 ?1108 08/31/21 ?0127  ?WBC 6.5  --  5.5  ?NEUTROABS 5.4  --   --   ?HGB 11.0* 10.9* 10.7*  ?HCT 35.1* 32.0* 32.5*  ?MCV 92.6  --  92.1  ?PLT 143*  --  144*  ? ? ?CBG: ?Recent Labs  ?Lab 09/01/21 ?1112 09/01/21 ?1554 09/01/21 ?2157 09/02/21 ?0615 09/02/21 ?0834  ?GLUCAP 202* 216* 255* 171* 196*  ? ? ?Time spent in discharge (includes decision making & examination of pt): ?35 minutes ? ?09/02/2021, 11:00 AM  ? ?Cherene Altes, MD ?[facility] ?Office  [phone] ? ? ? ? ? ?

## 2021-09-04 LAB — CULTURE, BLOOD (ROUTINE X 2)
Culture: NO GROWTH
Culture: NO GROWTH
Special Requests: ADEQUATE

## 2021-09-13 ENCOUNTER — Ambulatory Visit: Payer: Medicare HMO | Admitting: Cardiology

## 2021-09-26 DIAGNOSIS — I201 Angina pectoris with documented spasm: Secondary | ICD-10-CM | POA: Insufficient documentation

## 2021-09-26 NOTE — Progress Notes (Deleted)
as Cardiology Office Note:    Date:  09/26/2021   ID:  Francis Cardenas, DOB 08/29/1950, MRN [id number]  PCP:  Gerome Sam, MD  [facility] Providers Cardiologist:  Fransico Him, MD { Click to update primary MD,subspecialty MD or APP then REFRESH:1}  *** Referring MD: Gerome Sam*   Chief Complaint:  No chief complaint on file. {Click here for Visit Info    :1}   Patient Profile: (HFpEF) heart failure with preserved ejection fraction  Coronary vasospasm Cath 2005 - no CAD Cath 2020 - no CAD  Persistent atrial fibrillation  Hypertension  Diabetes mellitus  Hyperlipidemia  Chronic Obstructive Pulmonary Disease Prostate CA  ETOH abuse  PTSD/Depression Obesity   Prior CV Studies: ECHO COMPLETE WO IMAGING ENHANCING AGENT 08/31/2021 1. Left ventricular ejection fraction, by estimation, is 60 to 65%. The left ventricle has normal function. The left ventricle has no regional wall motion abnormalities. Left ventricular diastolic parameters are indeterminate. 2. Right ventricular systolic function is normal. The right ventricular size is mildly enlarged. There is mildly elevated pulmonary artery systolic pressure. 3. Left atrial size was mildly dilated. 4. Right atrial size was severely dilated. 5. The mitral valve is normal in structure. No evidence of mitral valve regurgitation. No evidence of mitral stenosis. 6. The aortic valve is normal in structure. Aortic valve regurgitation is not visualized. No aortic stenosis is present. 7. The inferior vena cava is dilated in size with <50% respiratory variability, suggesting right atrial pressure of 15 mmHg. estimated right ventricular systolic pressure is 32.9 mmHg.  RIGHT/LEFT HEART CATH AND CORONARY ANGIOGRAPHY 04/09/2019 Narrative  LV end diastolic pressure is moderately elevated.  The left ventricular systolic function is normal.  The left ventricular ejection fraction is 55-65% by visual estimate.   There is trivial (1+) mitral regurgitation.  There is no aortic valve stenosis.  Hemodynamic findings consistent with moderate pulmonary hypertension.  1. Normal coronary anatomy 2. Normal LV systolic function 3. Moderately elevated LV filling pressures. Large V waves to 58 mm Hg c/w LV restriction. No significant MR noted. 4. No AV gradient. 5. Moderate pulmonary HTN 6. Preserved cardiac output. Index 3.  NM Myocar Multi W/Spect W/Wall Motion / EF 04/08/2019 1.  Equivocal myocardial perfusion imaging study with significant gut uptake artifact.  Indeterminate for ischemia in the inferior/inferolateral segments as described above. 2.  Mildly dilated left ventricular cavity with mildly reduced ejection fraction, 45%.    History of Present Illness:   Francis Cardenas is a 71 y.o. male with the above problem list.  He was last seen by Coletta Memos, NP in Jan 2023.  He was recently admitted 4/10-4/13 with decompensated HF and AECOPD.  He was diuresed.  Echocardiogram showed normal EF and mild pulmonary hypertension.  The patient is usually followed at the [facility].  Notes indicate he was to be set up for DCCV at the [facility].  The pt did opt to f/u with Dr. Harrington Challenger with [facility] instead of the [facility].  His AFib has been tx with a rate control strategy.    He returns for f/u.  ***    Past Medical History:  Diagnosis Date   BPH (benign prostatic hyperplasia)    Cancer (HCC)    prostate   COPD (chronic obstructive pulmonary disease) ([place])    Diabetes mellitus without complication ([place])    Hypercholesteremia    Hypertension    Current Medications: No outpatient medications have been marked as taking for the 09/27/21 encounter (Appointment)  with Richardson Dopp T, PA-C.    Allergies:   Oxcarbazepine and Zolpidem   Social History   Tobacco Use   Smoking status: Former    Passive exposure: Never   Smokeless tobacco: Never  Vaping Use   Vaping Use: Never used  Substance Use Topics    Alcohol use: Yes    Alcohol/week: 8.0 standard drinks    Types: 8 Cans of beer per week    Comment: 8 cans beer/day   Drug use: No    Family Hx: The patient's family history includes CAD in his mother; Heart attack in his brother and father; Leukemia in his mother.  ROS   EKGs/Labs/Other Test Reviewed:    EKG:  EKG is *** ordered today.  The ekg ordered today demonstrates ***  Recent Labs: 05/15/2021: TSH 2.353 08/30/2021: ALT 10; B Natriuretic Peptide 529.7 08/31/2021: Hemoglobin 10.7; Platelets 144 09/02/2021: BUN 35; Creatinine, Ser 1.04; Magnesium 2.4; Potassium 3.4; Sodium 135   Recent Lipid Panel No results for input(s): CHOL, TRIG, HDL, VLDL, LDLCALC, LDLDIRECT in the last 8760 hours.   Risk Assessment/Calculations:   {Does this patient have ATRIAL FIBRILLATION?:[id number]}     Physical Exam:    VS:  There were no vitals taken for this visit.    Wt Readings from Last 3 Encounters:  09/02/21 289 lb 11 oz (131.4 kg)  06/09/21 288 lb 6.4 oz (130.8 kg)  05/22/21 278 lb 7.1 oz (126.3 kg)    Physical Exam ***     ASSESSMENT & PLAN:   No problem-specific Assessment & Plan notes found for this encounter.        {Are you ordering a CV Procedure (e.g. stress test, cath, DCCV, TEE, etc)?   Press F2        :[id number]}  Dispo:  No follow-ups on file.   Medication Adjustments/Labs and Tests Ordered: Current medicines are reviewed at length with the patient today.  Concerns regarding medicines are outlined above.  Tests Ordered: No orders of the defined types were placed in this encounter.  Medication Changes: No orders of the defined types were placed in this encounter.  Signed, Richardson Dopp, PA-C  09/26/2021 8:53 PM    [facility] [place], [place], [place]  [address] Phone: [phone]; Fax: [phone]

## 2021-09-27 ENCOUNTER — Emergency Department (HOSPITAL_COMMUNITY)
Admission: EM | Admit: 2021-09-27 | Discharge: 2021-09-27 | Disposition: A | Discharge status: Home / Self Care | Payer: No Typology Code available for payment source | Attending: Emergency Medicine | Admitting: Emergency Medicine

## 2021-09-27 ENCOUNTER — Emergency Department (HOSPITAL_COMMUNITY): Payer: No Typology Code available for payment source

## 2021-09-27 ENCOUNTER — Ambulatory Visit: Payer: Medicare HMO | Admitting: Physician Assistant

## 2021-09-27 ENCOUNTER — Encounter (HOSPITAL_COMMUNITY): Payer: Self-pay | Admitting: Emergency Medicine

## 2021-09-27 DIAGNOSIS — I5030 Unspecified diastolic (congestive) heart failure: Secondary | ICD-10-CM | POA: Diagnosis not present

## 2021-09-27 DIAGNOSIS — Z7951 Long term (current) use of inhaled steroids: Secondary | ICD-10-CM | POA: Diagnosis not present

## 2021-09-27 DIAGNOSIS — Z87891 Personal history of nicotine dependence: Secondary | ICD-10-CM | POA: Insufficient documentation

## 2021-09-27 DIAGNOSIS — J449 Chronic obstructive pulmonary disease, unspecified: Secondary | ICD-10-CM | POA: Diagnosis not present

## 2021-09-27 DIAGNOSIS — E119 Type 2 diabetes mellitus without complications: Secondary | ICD-10-CM | POA: Diagnosis not present

## 2021-09-27 DIAGNOSIS — Z79899 Other long term (current) drug therapy: Secondary | ICD-10-CM | POA: Insufficient documentation

## 2021-09-27 DIAGNOSIS — Z8546 Personal history of malignant neoplasm of prostate: Secondary | ICD-10-CM | POA: Insufficient documentation

## 2021-09-27 DIAGNOSIS — M1A0410 Idiopathic chronic gout, right hand, without tophus (tophi): Secondary | ICD-10-CM

## 2021-09-27 DIAGNOSIS — M1A9XX0 Chronic gout, unspecified, without tophus (tophi): Secondary | ICD-10-CM | POA: Diagnosis present

## 2021-09-27 DIAGNOSIS — F1010 Alcohol abuse, uncomplicated: Secondary | ICD-10-CM | POA: Diagnosis not present

## 2021-09-27 DIAGNOSIS — I110 Hypertensive heart disease with heart failure: Secondary | ICD-10-CM | POA: Insufficient documentation

## 2021-09-27 DIAGNOSIS — Z7901 Long term (current) use of anticoagulants: Secondary | ICD-10-CM | POA: Insufficient documentation

## 2021-09-27 LAB — CBC WITH DIFFERENTIAL/PLATELET
Abs Immature Granulocytes: 0.16 10*3/uL — ABNORMAL HIGH (ref 0.00–0.07)
Basophils Absolute: 0 10*3/uL (ref 0.0–0.1)
Basophils Relative: 1 %
Eosinophils Absolute: 0 10*3/uL (ref 0.0–0.5)
Eosinophils Relative: 0 %
HCT: 37.1 % — ABNORMAL LOW (ref 39.0–52.0)
Hemoglobin: 12.3 g/dL — ABNORMAL LOW (ref 13.0–17.0)
Immature Granulocytes: 2 %
Lymphocytes Relative: 18 %
Lymphs Abs: 1.4 10*3/uL (ref 0.7–4.0)
MCH: 29.9 pg (ref 26.0–34.0)
MCHC: 33.2 g/dL (ref 30.0–36.0)
MCV: 90.3 fL (ref 80.0–100.0)
Monocytes Absolute: 0.6 10*3/uL (ref 0.1–1.0)
Monocytes Relative: 8 %
Neutro Abs: 5.6 10*3/uL (ref 1.7–7.7)
Neutrophils Relative %: 71 %
Platelets: 228 10*3/uL (ref 150–400)
RBC: 4.11 MIL/uL — ABNORMAL LOW (ref 4.22–5.81)
RDW: 15.3 % (ref 11.5–15.5)
WBC: 7.8 10*3/uL (ref 4.0–10.5)
nRBC: 0 % (ref 0.0–0.2)

## 2021-09-27 LAB — SEDIMENTATION RATE: Sed Rate: 66 mm/hr — ABNORMAL HIGH (ref 0–16)

## 2021-09-27 LAB — BASIC METABOLIC PANEL
Anion gap: 13 (ref 5–15)
BUN: 15 mg/dL (ref 8–23)
CO2: 24 mmol/L (ref 22–32)
Calcium: 10.6 mg/dL — ABNORMAL HIGH (ref 8.9–10.3)
Chloride: 100 mmol/L (ref 98–111)
Creatinine, Ser: 0.96 mg/dL (ref 0.61–1.24)
GFR, Estimated: >60 mL/min (ref >60)
Glucose, Bld: 150 mg/dL — ABNORMAL HIGH (ref 70–99)
Potassium: 3.5 mmol/L (ref 3.5–5.1)
Sodium: 137 mmol/L (ref 135–145)

## 2021-09-27 LAB — C-REACTIVE PROTEIN: CRP: 3.9 mg/dL — ABNORMAL HIGH (ref <1.0)

## 2021-09-27 MED ORDER — MORPHINE SULFATE (PF) 2 MG/ML IV SOLN
2.0000 mg | Freq: Once | INTRAVENOUS | Status: AC
Start: 2021-09-27 — End: 2021-09-27
  Administered 2021-09-27: 2 mg via INTRAVENOUS
  Filled 2021-09-27: qty 1

## 2021-09-27 MED ORDER — PREDNISONE 20 MG PO TABS
40.0000 mg | ORAL_TABLET | Freq: Every day | ORAL | 0 refills | Status: AC
Start: 2021-09-27 — End: 2021-10-02

## 2021-09-27 MED ORDER — ACETAMINOPHEN 325 MG PO TABS
650.0000 mg | ORAL_TABLET | Freq: Once | ORAL | Status: AC
  Administered 2021-09-27: 650 mg via ORAL
  Filled 2021-09-27: qty 2

## 2021-09-27 MED ORDER — DEXAMETHASONE 4 MG PO TABS
8.0000 mg | ORAL_TABLET | Freq: Once | ORAL | Status: AC
  Administered 2021-09-27: 8 mg via ORAL
  Filled 2021-09-27: qty 2

## 2021-09-27 MED ORDER — ACETAMINOPHEN 325 MG PO TABS: 650.0000 mg | ORAL_TABLET | Freq: Four times a day (QID) | ORAL | 0 refills | Status: AC | PRN

## 2021-09-27 MED ORDER — SODIUM CHLORIDE 0.9 % IV BOLUS: 500.0000 mL | Freq: Once | INTRAVENOUS | Status: DC

## 2021-09-27 MED ORDER — OXYCODONE HCL 5 MG PO TABS: 5.0000 mg | ORAL_TABLET | ORAL | 0 refills | Status: DC | PRN

## 2021-09-27 MED ORDER — ONDANSETRON HCL 4 MG/2ML IJ SOLN
4.0000 mg | Freq: Once | INTRAMUSCULAR | Status: AC
  Administered 2021-09-27: 4 mg via INTRAVENOUS
  Filled 2021-09-27: qty 2

## 2021-09-27 NOTE — ED Notes (Signed)
Pt verbalizes understanding of discharge instructions. Opportunity for questions and answers were provided. Pt discharged from the ED.   ?

## 2021-09-27 NOTE — ED Notes (Signed)
Patient transported to X-ray 

## 2021-09-27 NOTE — Discharge Instructions (Addendum)
It was a pleasure caring for you today in the emergency department. ? ?Please return to the emergency department for any worsening or worrisome symptoms. ? ?Please refrain from alcohol use.  ? ?Please see pcp for repeat wrist x-ray in about 1 week ?

## 2021-09-27 NOTE — ED Triage Notes (Signed)
Pt reports gout pain x5 days, R hand and wrist, bilat ankles and feet. Pain sites appear swollen w/ brace. Pt has hx of being admitted for same reason.  ?

## 2021-09-27 NOTE — ED Provider Notes (Signed)
?[facility] ?Provider Note ? ? ?CSN: [id number] ?Arrival date & time: 09/27/21  5852 ? ?  ? ?History ? ?Chief Complaint  ?Patient presents with  ? Gout  ? ? ?Francis Cardenas is a 71 y.o. male. ? ? Patient as above with significant medical history as below, including diastolic heart failure, A-fib on DOAC, hypertension hyperlipidemia, DM, obesity, chronic alcohol abuse who presents to the ED with complaint of concern for gout flare.  Pain primarily having pain to his right hand.  No trauma.  Also some pain to his bilateral feet.  He has been taking Motrin periodically for the discomfort.  Advised patient to not take this as he is on Eliquis.  Patient also has increased alcohol consumption from prior admission.  He is now drinking 10-12 beers daily.  Last consumption was last night.  Also reports recent shellfish ingestion. Feels his respiratory status is at his typical level.  No significant conversational dyspnea.  He has some mild swelling to his ankles which has not acutely worsened from prior the patient.  Taking home medications as prescribed. No chest pain.  No abdominal pain, nausea or vomiting.  No fevers or chills.  No falls.  ? ? ? ? ?Past Medical History: ?No date: BPH (benign prostatic hyperplasia) ?No date: Cancer [facility]) ?    Comment:  prostate ?No date: COPD (chronic obstructive pulmonary disease) ([place]) ?No date: Diabetes mellitus without complication ([place]) ?No date: Hypercholesteremia ?No date: Hypertension ? ?Past Surgical History: ?No date: ACHILLES TENDON REPAIR; Left ?No date: KNEE ARTHROSCOPY; Left ?04/09/2019: RIGHT/LEFT HEART CATH AND CORONARY ANGIOGRAPHY; N/A ?    Comment:  Procedure: RIGHT/LEFT HEART CATH AND CORONARY  ?             ANGIOGRAPHY;  Surgeon: Martinique, Peter M, MD;  Location: [facility] ?             INVASIVE CV LAB;  Service: Cardiovascular;  Laterality:  ?             N/A; ?No date: TONSILLECTOMY ?No date: WRIST FRACTURE SURGERY; Left  ? ? ?The history  is provided by the patient. No language interpreter was used.  ? ?  ? ?Home Medications ?Prior to Admission medications   ?Medication Sig Start Date End Date Taking? Authorizing Provider  ?acetaminophen (TYLENOL) 325 MG tablet Take 2 tablets (650 mg total) by mouth every 6 (six) hours as needed. 09/27/21  Yes [name], DO  ?oxyCODONE (ROXICODONE) 5 MG immediate release tablet Take 1 tablet (5 mg total) by mouth every 4 (four) hours as needed for severe pain. 09/27/21  Yes [name], DO  ?predniSONE (DELTASONE) 20 MG tablet Take 2 tablets (40 mg total) by mouth daily for 5 days. 09/27/21 10/02/21 Yes [name], DO  ?albuterol (PROVENTIL HFA;VENTOLIN HFA) 108 (90 BASE) MCG/ACT inhaler Inhale 2 puffs into the lungs every 6 (six) hours as needed for wheezing.    [provider]  ?allopurinol (ZYLOPRIM) 100 MG tablet Take 200 mg by mouth daily. 08/03/21   [provider]  ?ALPRAZolam (XANAX XR) 1 MG 24 hr tablet Take 1 mg by mouth at bedtime.    [provider]  ?apixaban (ELIQUIS) 5 MG TABS tablet Take 1 tablet (5 mg total) by mouth 2 (two) times daily. 05/20/21   Terrilee Croak, MD  ?atorvastatin (LIPITOR) 80 MG tablet Take 40 mg by mouth daily with supper.    [provider]  ?Carboxymethylcellulose Sodium  0.25 % SOLN Place 1 drop into both eyes 3 (three) times daily as needed (dry eyes/ irritation).    [provider]  ?Cholecalciferol (VITAMIN D3 PO) Take 1 tablet by mouth daily with supper.    [provider]  ?colchicine 0.6 MG tablet Take 1 tablet (0.6 mg total) by mouth daily. ?Patient not taking: Reported on 08/31/2021 05/21/21   Terrilee Croak, MD  ?diclofenac Sodium (VOLTAREN) 1 % GEL Apply 2 g topically 4 (four) times daily. 05/20/21   Terrilee Croak, MD  ?Ensure Max Protein (ENSURE MAX PROTEIN) LIQD Take 330 mLs (11 oz total) by mouth 2 (two) times daily. ?Patient taking differently: Take 11 oz by mouth daily as needed (meal supplement). 12/12/18    [name], [name], MD  ?folic acid (FOLVITE) 1 MG tablet Take 1 tablet (1 mg total) by mouth daily. ?Patient not taking: Reported on 08/31/2021 05/21/21   Terrilee Croak, MD  ?furosemide (LASIX) 40 MG tablet Take 1 tablet (40 mg total) by mouth 2 (two) times daily. ?Patient not taking: Reported on 08/31/2021 05/20/21 08/18/21  Terrilee Croak, MD  ?gabapentin (NEURONTIN) 300 MG capsule Take 300 mg by mouth 3 (three) times daily.    [provider]  ?guaifenesin (HUMIBID E) 400 MG TABS tablet Take 400 mg by mouth every morning.    [provider]  ?Magnesium Oxide 420 MG TABS Take 420 mg by mouth daily with supper.    [provider]  ?metoprolol succinate (TOPROL-XL) 25 MG 24 hr tablet Take 0.5 tablets (12.5 mg total) by mouth daily. 09/04/21   Cherene Altes, MD  ?mometasone-formoterol (DULERA) 100-5 MCG/ACT AERO Inhale 2 puffs into the lungs 2 (two) times daily. 05/20/21   Terrilee Croak, MD  ?Multiple Vitamins-Minerals (MULTIVITAMIN WITH MINERALS) tablet Take 1 tablet by mouth daily with lunch.     [provider]  ?omeprazole (PRILOSEC) 20 MG capsule Take 20 mg by mouth daily with supper.    [provider]  ?potassium chloride SA (KLOR-CON M) 20 MEQ tablet Take 1 tablet (20 mEq total) by mouth daily with supper. 05/20/21   Terrilee Croak, MD  ?spironolactone (ALDACTONE) 25 MG tablet Take 25 mg by mouth every morning.    [provider]  ?thiamine 100 MG tablet Take 1 tablet (100 mg total) by mouth daily. ?Patient taking differently: Take 100 mg by mouth daily. Vitamin B1 06/05/20   [name], MD  ?vitamin B-12 (CYANOCOBALAMIN) 500 MCG tablet Take 500 mcg by mouth daily with supper.    [provider]  ?   ? ?Allergies    ?Oxcarbazepine and Zolpidem   ? ?Review of Systems   ?Review of Systems  ?Constitutional:  Negative for chills and fever.  ?HENT:  Negative for facial swelling and trouble swallowing.   ?Eyes:  Negative for photophobia and visual  disturbance.  ?Respiratory:  Negative for cough and shortness of breath.   ?Cardiovascular:  Negative for chest pain and palpitations.  ?Gastrointestinal:  Negative for abdominal pain, nausea and vomiting.  ?Endocrine: Negative for polydipsia and polyuria.  ?Genitourinary:  Negative for difficulty urinating and hematuria.  ?Musculoskeletal:  Positive for arthralgias. Negative for gait problem and joint swelling.  ?Skin:  Negative for pallor and rash.  ?Neurological:  Negative for syncope and headaches.  ?Psychiatric/Behavioral:  Negative for agitation and confusion.   ? ?Physical Exam ?Updated Vital Signs ?BP (!) 151/57   Pulse 74   Temp 98.3 ?F (36.8 ?C)   Resp 20   SpO2  91%  ?Physical Exam ?Vitals and nursing note reviewed.  ?Constitutional:   ?   General: He is not in acute distress. ?   Appearance: He is well-developed.  ?HENT:  ?   Head: Normocephalic and atraumatic.  ?   Right Ear: External ear normal.  ?   Left Ear: External ear normal.  ?   Mouth/Throat:  ?   Mouth: Mucous membranes are moist.  ?Eyes:  ?   General: No scleral icterus. ?Cardiovascular:  ?   Rate and Rhythm: Normal rate and regular rhythm.  ?   Pulses: Normal pulses.  ?   Heart sounds: Normal heart sounds.  ?Pulmonary:  ?   Effort: Pulmonary effort is normal. No respiratory distress.  ?   Breath sounds: Normal breath sounds.  ?Abdominal:  ?   General: Abdomen is flat.  ?   Palpations: Abdomen is soft.  ?   Tenderness: There is no abdominal tenderness.  ?Musculoskeletal:     ?   General: Normal range of motion.  ?     Hands: ? ?   Cervical back: Normal range of motion.  ?   Right lower leg: Edema (trace) present.  ?   Left lower leg: Edema (trace) present.  ?   Comments: Right hand does appear swollen, tender to palpation.  Reduced range of motion secondary to discomfort. ?Radial pulses equal bilateral  ?Skin: ?   General: Skin is warm and dry.  ?   Capillary Refill: Capillary refill takes less than 2 seconds.  ?Neurological:  ?   Mental  Status: He is alert and oriented to person, place, and time.  ?   GCS: GCS eye subscore is 4. GCS verbal subscore is 5. GCS motor subscore is 6.  ?Psychiatric:     ?   Mood and Affect: Mood normal.     ?   Behavior: B

## 2021-09-29 ENCOUNTER — Telehealth: Payer: Self-pay | Admitting: Cardiology

## 2021-09-29 NOTE — Telephone Encounter (Signed)
OK by me 

## 2021-09-29 NOTE — Telephone Encounter (Signed)
Patient is requesting a provider switch from Dr. Radford Pax to Dr. Harrell Gave due to [facility] being closer for him.  ?

## 2021-10-05 ENCOUNTER — Other Ambulatory Visit (HOSPITAL_BASED_OUTPATIENT_CLINIC_OR_DEPARTMENT_OTHER): Payer: Self-pay

## 2021-10-05 ENCOUNTER — Other Ambulatory Visit: Payer: Self-pay

## 2021-10-05 ENCOUNTER — Encounter (HOSPITAL_BASED_OUTPATIENT_CLINIC_OR_DEPARTMENT_OTHER): Payer: Self-pay | Admitting: Obstetrics and Gynecology

## 2021-10-05 ENCOUNTER — Emergency Department (HOSPITAL_BASED_OUTPATIENT_CLINIC_OR_DEPARTMENT_OTHER)
Admission: EM | Admit: 2021-10-05 | Discharge: 2021-10-05 | Disposition: A | Discharge status: Home / Self Care | Payer: No Typology Code available for payment source | Attending: Emergency Medicine | Admitting: Emergency Medicine

## 2021-10-05 DIAGNOSIS — M109 Gout, unspecified: Secondary | ICD-10-CM | POA: Diagnosis not present

## 2021-10-05 DIAGNOSIS — M25531 Pain in right wrist: Secondary | ICD-10-CM | POA: Diagnosis present

## 2021-10-05 MED ORDER — COLCHICINE 0.6 MG PO TABS
0.6000 mg | ORAL_TABLET | Freq: Every day | ORAL | 0 refills | Status: DC
  Filled 2021-10-05: qty 30, 30d supply, fill #0

## 2021-10-05 NOTE — ED Triage Notes (Signed)
Patient reports pain in the right wrist and right ankle. Patient reports he thinks it is his gout. Patient reports he has a hx of Gout problems.  ?

## 2021-10-05 NOTE — ED Provider Notes (Signed)
?[facility] ?Provider Note ? ? ?CSN: [id number] ?Arrival date & time: 10/05/21  9937 ? ?  ? ?History ? ?Chief Complaint  ?Patient presents with  ? Wrist Pain  ? Ankle Pain  ? ? ?Francis Cardenas is a 71 y.o. male. ? ?HPI ? ?71 year old male history of gout presents today complaining of right wrist and hand pain as well as bilateral ankle pain.  He was seen 1 week ago in the [facility], ED and at that time was given a prescription for Percocet and prednisone.  The pain was improved for several days but he has completed both of these courses and is having worsening pain.  He denies any fever, redness, chills, nausea, vomiting.  He is not having any significant flares of his other comorbidities.  He states that he has been taking allopurinol for 1 month to 6 weeks without any significant improvement in his symptoms.  He states he was hospitalized in December and went to rehab in January he reports that during that time he was on colchicine. ? ?Home Medications ?Prior to Admission medications   ?Medication Sig Start Date End Date Taking? Authorizing Provider  ?colchicine 0.6 MG tablet Take 1 tablet (0.6 mg total) by mouth daily. 10/05/21  Yes [name], MD  ?acetaminophen (TYLENOL) 325 MG tablet Take 2 tablets (650 mg total) by mouth every 6 (six) hours as needed. 09/27/21   Jeanell Sparrow, DO  ?albuterol (PROVENTIL HFA;VENTOLIN HFA) 108 (90 BASE) MCG/ACT inhaler Inhale 2 puffs into the lungs every 6 (six) hours as needed for wheezing.    [provider]  ?allopurinol (ZYLOPRIM) 100 MG tablet Take 200 mg by mouth daily. 08/03/21   [provider]  ?ALPRAZolam (XANAX XR) 1 MG 24 hr tablet Take 1 mg by mouth at bedtime.    [provider]  ?apixaban (ELIQUIS) 5 MG TABS tablet Take 1 tablet (5 mg total) by mouth 2 (two) times daily. 05/20/21   Terrilee Croak, MD  ?atorvastatin (LIPITOR) 80 MG tablet Take 40 mg by mouth daily with supper.    [provider]   ?Carboxymethylcellulose Sodium 0.25 % SOLN Place 1 drop into both eyes 3 (three) times daily as needed (dry eyes/ irritation).    [provider]  ?Cholecalciferol (VITAMIN D3 PO) Take 1 tablet by mouth daily with supper.    [provider]  ?diclofenac Sodium (VOLTAREN) 1 % GEL Apply 2 g topically 4 (four) times daily. 05/20/21   Terrilee Croak, MD  ?Ensure Max Protein (ENSURE MAX PROTEIN) LIQD Take 330 mLs (11 oz total) by mouth 2 (two) times daily. ?Patient taking differently: Take 11 oz by mouth daily as needed (meal supplement). 12/12/18   [name], [name], MD  ?folic acid (FOLVITE) 1 MG tablet Take 1 tablet (1 mg total) by mouth daily. ?Patient not taking: Reported on 08/31/2021 05/21/21   Terrilee Croak, MD  ?furosemide (LASIX) 40 MG tablet Take 1 tablet (40 mg total) by mouth 2 (two) times daily. ?Patient not taking: Reported on 08/31/2021 05/20/21 08/18/21  Terrilee Croak, MD  ?gabapentin (NEURONTIN) 300 MG capsule Take 300 mg by mouth 3 (three) times daily.    [provider]  ?guaifenesin (HUMIBID E) 400 MG TABS tablet Take 400 mg by mouth every morning.    [provider]  ?Magnesium Oxide 420 MG TABS Take 420 mg by mouth daily with supper.    [provider]  ?metoprolol succinate (TOPROL-XL) 25 MG 24 hr tablet Take  0.5 tablets (12.5 mg total) by mouth daily. 09/04/21   Cherene Altes, MD  ?mometasone-formoterol (DULERA) 100-5 MCG/ACT AERO Inhale 2 puffs into the lungs 2 (two) times daily. 05/20/21   Terrilee Croak, MD  ?Multiple Vitamins-Minerals (MULTIVITAMIN WITH MINERALS) tablet Take 1 tablet by mouth daily with lunch.     [provider]  ?omeprazole (PRILOSEC) 20 MG capsule Take 20 mg by mouth daily with supper.    [provider]  ?oxyCODONE (ROXICODONE) 5 MG immediate release tablet Take 1 tablet (5 mg total) by mouth every 4 (four) hours as needed for severe pain. 09/27/21   Jeanell Sparrow, DO  ?potassium chloride SA (KLOR-CON M) 20 MEQ  tablet Take 1 tablet (20 mEq total) by mouth daily with supper. 05/20/21   Terrilee Croak, MD  ?spironolactone (ALDACTONE) 25 MG tablet Take 25 mg by mouth every morning.    [provider]  ?thiamine 100 MG tablet Take 1 tablet (100 mg total) by mouth daily. ?Patient taking differently: Take 100 mg by mouth daily. Vitamin B1 06/05/20   [name], MD  ?vitamin B-12 (CYANOCOBALAMIN) 500 MCG tablet Take 500 mcg by mouth daily with supper.    [provider]  ?   ? ?Allergies    ?Oxcarbazepine and Zolpidem   ? ?Review of Systems   ?Review of Systems  ?Constitutional:  Positive for activity change. Negative for chills and fever.  ?Eyes: Negative.   ?Respiratory: Negative.    ?Cardiovascular: Negative.   ?Gastrointestinal: Negative.   ? ?Physical Exam ?Updated Vital Signs ?BP (!) 141/59 (BP Location: Right Arm)   Pulse 79   Temp 98.6 ?F (37 ?C) (Oral)   Resp 19   SpO2 98%  ?Physical Exam ?Vitals and nursing note reviewed.  ?Constitutional:   ?   Appearance: Normal appearance. He is obese.  ?HENT:  ?   Head: Normocephalic.  ?   Right Ear: External ear normal.  ?   Left Ear: External ear normal.  ?   Nose: Nose normal.  ?   Mouth/Throat:  ?   Pharynx: Oropharynx is clear.  ?Eyes:  ?   Pupils: Pupils are equal, round, and reactive to light.  ?Cardiovascular:  ?   Rate and Rhythm: Normal rate and regular rhythm.  ?   Pulses: Normal pulses.  ?   Heart sounds: Normal heart sounds.  ?Pulmonary:  ?   Effort: Pulmonary effort is normal.  ?Abdominal:  ?   General: Abdomen is flat.  ?   Palpations: Abdomen is soft.  ?Musculoskeletal:     ?   General: Normal range of motion.  ?   Cervical back: Normal range of motion.  ?   Comments: Deformity of the metacarpal phalangeal joints 2 and 3 with what appears to be some chronic deformity there is no appear to be any effusion, fluctuance, or cellulitis ?There are some chronic deformity of the right wrist ?Bilateral ankles are slightly tender but with no erythema,  effusion, or joint deformity are noted  ?Skin: ?   General: Skin is warm.  ?   Capillary Refill: Capillary refill takes less than 2 seconds.  ?Neurological:  ?   General: No focal deficit present.  ?   Mental Status: He is alert.  ?Psychiatric:     ?   Mood and Affect: Mood normal.  ? ? ?ED Results / Procedures / Treatments   ?Labs ?(all labs ordered are listed, but only abnormal results are displayed) ?Labs Reviewed -  No data to display ? ?EKG ?None ? ?Radiology ?No results found. ? ?Procedures ?Procedures  ? ? ?Medications Ordered in ED ?Medications - No data to display ? ?ED Course/ Medical Decision Making/ A&P ?  ?                        ?Medical Decision Making ?71 year old known history of gout presents with pain in the right hand with changes of chronic gout noted.  Additionally has pain in his ankles and has had previous gout flares there but is not acute.  Of any acute abnormalities at this time. ?Have reviewed his last ED note and at that time he was started on Percocet and prednisone. ?Given his recent prednisone will not wish to repeat that at this time although he may receive a one-time dose in the ED ?He has also completed a recent dose of narcotics and did not wish to continue narcotics at this time.  Additionally he states that this did not really help him. ? ? ?Amount and/or Complexity of Data Reviewed ?Discussion of management or test interpretation with external provider(s): Discussed with Noralee Stain and discussed drug interactions and possible treatment courses. ? ? ?Risk ?Prescription drug management. ? ? ? ? ? ? ? ? ? ? ?Final Clinical Impression(s) / ED Diagnoses ?Final diagnoses:  ?Gouty arthritis  ? ? ?Rx / DC Orders ?ED Discharge Orders   ? ?      Ordered  ?  colchicine 0.6 MG tablet  Daily       ? 10/05/21 0916  ? ?  ?  ? ?  ? ? ?  ?[name], MD ?10/05/21 [phone] ? ?

## 2021-11-01 ENCOUNTER — Encounter (HOSPITAL_BASED_OUTPATIENT_CLINIC_OR_DEPARTMENT_OTHER): Payer: Self-pay | Admitting: Cardiology

## 2021-11-01 ENCOUNTER — Ambulatory Visit (HOSPITAL_BASED_OUTPATIENT_CLINIC_OR_DEPARTMENT_OTHER): Payer: Medicare HMO | Admitting: Cardiology

## 2021-11-01 VITALS — BP 140/60 | HR 60 | Ht 70.0 in

## 2021-11-01 DIAGNOSIS — Z7189 Other specified counseling: Secondary | ICD-10-CM

## 2021-11-01 DIAGNOSIS — I4811 Longstanding persistent atrial fibrillation: Secondary | ICD-10-CM

## 2021-11-01 DIAGNOSIS — I5032 Chronic diastolic (congestive) heart failure: Secondary | ICD-10-CM | POA: Diagnosis not present

## 2021-11-01 DIAGNOSIS — F1010 Alcohol abuse, uncomplicated: Secondary | ICD-10-CM

## 2021-11-01 DIAGNOSIS — I10 Essential (primary) hypertension: Secondary | ICD-10-CM

## 2021-11-01 NOTE — Patient Instructions (Addendum)
  Follow-Up: At [facility], you and your health needs are our priority.  As part of our continuing mission to provide you with exceptional heart care, we have created designated Provider Care Teams.  These Care Teams include your primary Cardiologist (physician) and Advanced Practice Providers (APPs -  Physician Assistants and Nurse Practitioners) who all work together to provide you with the care you need, when you need it.  We recommend signing up for the patient portal called "MyChart".  Sign up information is provided on this After Visit Summary.  MyChart is used to connect with patients for Virtual Visits (Telemedicine).  Patients are able to view lab/test results, encounter notes, upcoming appointments, etc.  Non-urgent messages can be sent to your provider as well.   To learn more about what you can do with MyChart, go to [email or web address].    Your next appointment:   3 month(s)  The format for your next appointment:   In Person  Provider:   Buford Dresser, MD     Important Information About Sugar            Do the following things EVERY DAY:    Weigh yourself EVERY morning after you go to the bathroom but before you eat or drink anything. Write this number down in a weight log/diary. If you gain 3 pounds overnight or 5 pounds in a week, call the office.  Take your medicines as prescribed. If you have concerns about your medications, please call us before you stop taking them.   Eat low salt foods--Limit salt (sodium) to 2000 mg per day. This will help prevent your body from holding onto fluid. Read food labels as many processed foods have a lot of sodium, especially canned goods and prepackaged meats. If you would like some assistance choosing low sodium foods, we would be happy to set you up with a nutritionist.  Stay as active as you can everyday. Staying active will give you more energy and make your muscles stronger. Start with 5 minutes at a time  and work your way up to 30 minutes a day. Break up your activities--do some in the morning and some in the afternoon. Start with 3 days per week and work your way up to 5 days as you can.  If you have chest pain, feel short of breath, dizzy, or lightheaded, STOP. If you don't feel better after a short rest, call 911. If you do feel better, call the office to let us know you have symptoms with exercise.  Limit all fluids for the day to less than 2 liters. Fluid includes all drinks, coffee, juice, ice chips, soup, jello, and all other liquids.   If your weight and swelling does not go down, please call our office. You may need to increase your fluid pill. Please let us know the dose of torsemide you are on at home.  Would discuss medications such Farxiga or Jardiance (SGLT2 inhibitors) with your [place] doctor to see if you can be prescribed this through the [place]. They will need to monitor your kidney function.

## 2021-11-01 NOTE — Progress Notes (Signed)
Cardiology Office Note:    Date:  11/01/2021   ID:  Francis Cardenas, DOB 11-05-1950, MRN [id number]  PCP:  Gerome Sam, MD  Cardiologist:  Buford Dresser, MD  Referring MD: Gerome Sam*   CC: establish care with me/general cardiology follow up  History of Present Illness:    Francis Cardenas is a 71 y.o. male with a hx of hypertension, COPD, chronic diastolic heart failure, atrial fibrillation, pulmonary hypertension who is seen for follow up. He is a new patient to me, previously seen by Dr. Radford Pax, Dr. Acie Fredrickson, and Dr. Harrington Challenger. His last admission was 08/2021.  Today: His main concern today is gout. He is a disabled veteran and is followed by a PCP at the [place]. He does not feel that is gout is well controlled, asking for options for stronger medications to manage the pain.  He has noticed over the last two days that his feet are swelling. Weight going up as well. Typically about 278 lbs at home, this AM weighed 283 lbs. He is no longer on furosemide but on torsemide instead, unclear of the dose but taking twice a day. Makes good urine on this. Had takeout over the weekend, has noticed swelling since then. Tries to elevate feet while sitting. Knows that shellfish worsens his gout but had some over the weekend.  Also drinks 8-9 beers/day, has since he was 71 years old. No interest in cutting back. Discussed how this affects his heart.   Denies chest pain, shortness of breath at rest or with normal exertion. No PND, orthopnea. No syncope or palpitations.   Past Medical History:  Diagnosis Date   BPH (benign prostatic hyperplasia)    Cancer (HCC)    prostate   COPD (chronic obstructive pulmonary disease) (HCC)    Diabetes mellitus without complication (HCC)    Hypercholesteremia    Hypertension     Past Surgical History:  Procedure Laterality Date   ACHILLES TENDON REPAIR Left    KNEE ARTHROSCOPY Left    RIGHT/LEFT HEART CATH AND CORONARY ANGIOGRAPHY N/A  04/09/2019   Procedure: RIGHT/LEFT HEART CATH AND CORONARY ANGIOGRAPHY;  Surgeon: Martinique, Peter M, MD;  Location: [facility];  Service: Cardiovascular;  Laterality: N/A;   TONSILLECTOMY     WRIST FRACTURE SURGERY Left     Current Medications: Current Outpatient Medications on File Prior to Visit  Medication Sig   acetaminophen (TYLENOL) 325 MG tablet Take 2 tablets (650 mg total) by mouth every 6 (six) hours as needed.   albuterol (PROVENTIL HFA;VENTOLIN HFA) 108 (90 BASE) MCG/ACT inhaler Inhale 2 puffs into the lungs every 6 (six) hours as needed for wheezing.   allopurinol (ZYLOPRIM) 100 MG tablet Take 200 mg by mouth daily.   ALPRAZolam (XANAX XR) 1 MG 24 hr tablet Take 1 mg by mouth at bedtime.   apixaban (ELIQUIS) 5 MG TABS tablet Take 1 tablet (5 mg total) by mouth 2 (two) times daily.   atorvastatin (LIPITOR) 80 MG tablet Take 40 mg by mouth daily with supper.   Carboxymethylcellulose Sodium 0.25 % SOLN Place 1 drop into both eyes 3 (three) times daily as needed (dry eyes/ irritation).   Cholecalciferol (VITAMIN D3 PO) Take 1 tablet by mouth daily with supper.   diclofenac Sodium (VOLTAREN) 1 % GEL Apply 2 g topically 4 (four) times daily.   gabapentin (NEURONTIN) 300 MG capsule Take 300 mg by mouth 3 (three) times daily.   guaifenesin (HUMIBID E) 400 MG TABS tablet Take 400  mg by mouth every morning.   metoprolol succinate (TOPROL-XL) 25 MG 24 hr tablet Take 0.5 tablets (12.5 mg total) by mouth daily.   mometasone-formoterol (DULERA) 100-5 MCG/ACT AERO Inhale 2 puffs into the lungs 2 (two) times daily.   Multiple Vitamins-Minerals (MULTIVITAMIN WITH MINERALS) tablet Take 1 tablet by mouth daily with lunch.    omeprazole (PRILOSEC) 20 MG capsule Take 20 mg by mouth daily with supper.   potassium chloride SA (KLOR-CON M) 20 MEQ tablet Take 1 tablet (20 mEq total) by mouth daily with supper.   spironolactone (ALDACTONE) 25 MG tablet Take 25 mg by mouth every morning.   thiamine  100 MG tablet Take 1 tablet (100 mg total) by mouth daily. (Patient taking differently: Take 100 mg by mouth daily. Vitamin B1)   vitamin B-12 (CYANOCOBALAMIN) 500 MCG tablet Take 500 mcg by mouth daily with supper.   colchicine 0.6 MG tablet Take 1 tablet (0.6 mg total) by mouth daily. (Patient not taking: Reported on 11/01/2021)   Ensure Max Protein (ENSURE MAX PROTEIN) LIQD Take 330 mLs (11 oz total) by mouth 2 (two) times daily. (Patient not taking: Reported on 7/90/2409)   folic acid (FOLVITE) 1 MG tablet Take 1 tablet (1 mg total) by mouth daily. (Patient not taking: Reported on 08/31/2021)   furosemide (LASIX) 40 MG tablet Take 1 tablet (40 mg total) by mouth 2 (two) times daily. (Patient not taking: Reported on 08/31/2021)   Magnesium Oxide 420 MG TABS Take 420 mg by mouth daily with supper. (Patient not taking: Reported on 11/01/2021)   oxyCODONE (ROXICODONE) 5 MG immediate release tablet Take 1 tablet (5 mg total) by mouth every 4 (four) hours as needed for severe pain. (Patient not taking: Reported on 11/01/2021)   No current facility-administered medications on file prior to visit.     Allergies:   Oxcarbazepine and Zolpidem   Social History   Tobacco Use   Smoking status: Former    Passive exposure: Never   Smokeless tobacco: Never  Vaping Use   Vaping Use: Never used  Substance Use Topics   Alcohol use: Yes    Alcohol/week: 8.0 standard drinks of alcohol    Types: 8 Cans of beer per week    Comment: 8 cans beer/day   Drug use: No    Family History: family history includes CAD in his mother; Heart attack in his brother and father; Leukemia in his mother.  ROS:   Please see the history of present illness.  Additional pertinent ROS otherwise unremarkable.  EKGs/Labs/Other Studies Reviewed:    The following studies were reviewed today: Echo 08/31/21:    1. Left ventricular ejection fraction, by estimation, is 60 to 65%. The  left ventricle has normal function. The left  ventricle has no regional  wall motion abnormalities. Left ventricular diastolic parameters are  indeterminate.   2. Right ventricular systolic function is normal. The right ventricular  size is mildly enlarged. There is mildly elevated pulmonary artery  systolic pressure.   3. Left atrial size was mildly dilated.   4. Right atrial size was severely dilated.   5. The mitral valve is normal in structure. No evidence of mitral valve  regurgitation. No evidence of mitral stenosis.   6. The aortic valve is normal in structure. Aortic valve regurgitation is  not visualized. No aortic stenosis is present.   7. The inferior vena cava is dilated in size with <50% respiratory  variability, suggesting right atrial pressure of 15 mmHg.  Cath 8786 LV end diastolic pressure is moderately elevated. The left ventricular systolic function is normal. The left ventricular ejection fraction is 55-65% by visual estimate. There is trivial (1+) mitral regurgitation. There is no aortic valve stenosis. Hemodynamic findings consistent with moderate pulmonary hypertension.   1. Normal coronary anatomy 2. Normal LV systolic function 3. Moderately elevated LV filling pressures. Large V waves to 58 mm Hg c/w LV restriction. No significant MR noted. 4. No AV gradient. 5. Moderate pulmonary HTN 6. Preserved cardiac output. Index 3.     EKG:  EKG is personally reviewed.   11/01/21: not ordered today 08/31/21: afib at 60 bpm  Recent Labs: 05/15/2021: TSH 2.353 08/30/2021: ALT 10; B Natriuretic Peptide 529.7 09/02/2021: Magnesium 2.4 09/27/2021: BUN 15; Creatinine, Ser 0.96; Hemoglobin 12.3; Platelets 228; Potassium 3.5; Sodium 137  Recent Lipid Panel    Component Value Date/Time   CHOL 125 04/10/2019 0541   TRIG 103 04/10/2019 0541   HDL 41 04/10/2019 0541   CHOLHDL 3.0 04/10/2019 0541   VLDL 21 04/10/2019 0541   LDLCALC 63 04/10/2019 0541    Physical Exam:    VS:  BP 140/60   Pulse 60   Ht '5\' 10"'$   (1.778 m)   BMI 41.57 kg/m     Wt Readings from Last 3 Encounters:  09/02/21 289 lb 11 oz (131.4 kg)  06/09/21 288 lb 6.4 oz (130.8 kg)  05/22/21 278 lb 7.1 oz (126.3 kg)    GEN: Well nourished, well developed in no acute distress HEENT: Normal, moist mucous membranes NECK: No JVD sitting upright CARDIAC: irregularly irregular rhythm, normal S1 and S2, no rubs or gallops. No murmur. VASCULAR: Radial and DP pulses 2+ bilaterally. No carotid bruits RESPIRATORY:  Clear to auscultation without rales, wheezing or rhonchi  ABDOMEN: Soft, non-tender, non-distended MUSCULOSKELETAL: In wheelchair, moves all 4 limbs independently. SKIN: Warm and dry, bilateral 1+ LE edema NEUROLOGIC:  Alert and oriented x 3. No focal neuro deficits noted. PSYCHIATRIC:  Normal affect    ASSESSMENT:    1. Chronic diastolic CHF (congestive heart failure) ([place])   2. Benign essential hypertension   3. Longstanding persistent atrial fibrillation ([place])   4. ETOH abuse   5. Encounter for education about heart failure    PLAN:    Chronic diastolic heart failure -on torsemide BID, he is unsure of the dose -recently ate salt, more swelling today. Discussed diet, salt avoidance, monitoring weights -drinks 8-9 beers/day. Advised against this -continue spironolactone -reported history of diabetes, but last A1c 5.5 and on no meds -would consider SGLT2i, would need to get approved through the [place] -I instructed him to watch his swelling/weight closely. If it improves in the next 1-2 days, then would not make changes. If weight and swelling increases, I would increase his diuretic for several days. -heart failure education done today  Atrial fibrillation CHA2DS2/VAS Stroke Risk Points=4 -continue apixaban -continue metoprolol succinate 25 mg daily. Avoid bradycardia with diastolic heart failure -he states that he has been in afib since diagnosis. Unclear what has been tried before--Dr. Edd Arbour at the [facility] recommend  cardioversion but he was declined (issues with transportation, alcohol use)  Hypertension -elevated slightly today, but volume up -continue torsemide, spironolactone  OSA on CPAP: continue CPAP  Alcohol abuse: 8-9 beers/day, counseled on limiting or stopping use with heart failure and afib. He is not interested.  Cardiac risk counseling and prevention recommendations: prior cath without significant CAD -recommend heart healthy/Mediterranean diet, with whole grains, fruits, vegetable, fish,  lean meats, nuts, and olive oil. Limit salt. -recommend moderate walking, 3-5 times/week for 30-50 minutes each session. Aim for at least 150 minutes.week. Goal should be pace of 3 miles/hours, or walking 1.5 miles in 30 minutes -recommend avoidance of tobacco products. Avoid excess alcohol. -ASCVD risk score: The ASCVD Risk score (Arnett DK, et al., 2019) failed to calculate for the following reasons:   The valid total cholesterol range is 130 to 320 mg/dL    Plan for follow up: 3 mos or sooner as needed  Total time of encounter: 49 minutes total time of encounter, including 46 minutes spent in face-to-face patient care. This time includes coordination of care and counseling regarding history, management of symptoms of heart failure, options for afib. Remainder of non-face-to-face time involved reviewing chart documents/testing relevant to the patient encounter and documentation in the medical record.  Buford Dresser, MD, PhD, [facility]    Medication Adjustments/Labs and Tests Ordered: Current medicines are reviewed at length with the patient today.  Concerns regarding medicines are outlined above.  No orders of the defined types were placed in this encounter.  No orders of the defined types were placed in this encounter.   Patient Instructions   Follow-Up: At [facility], you and your health needs are our priority.  As part of our continuing mission to provide  you with exceptional heart care, we have created designated Provider Care Teams.  These Care Teams include your primary Cardiologist (physician) and Advanced Practice Providers (APPs -  Physician Assistants and Nurse Practitioners) who all work together to provide you with the care you need, when you need it.  We recommend signing up for the patient portal called "MyChart".  Sign up information is provided on this After Visit Summary.  MyChart is used to connect with patients for Virtual Visits (Telemedicine).  Patients are able to view lab/test results, encounter notes, upcoming appointments, etc.  Non-urgent messages can be sent to your provider as well.   To learn more about what you can do with MyChart, go to [email or web address].    Your next appointment:   3 month(s)  The format for your next appointment:   In Person  Provider:   Buford Dresser, MD     Important Information About Sugar            Do the following things EVERY DAY:    Weigh yourself EVERY morning after you go to the bathroom but before you eat or drink anything. Write this number down in a weight log/diary. If you gain 3 pounds overnight or 5 pounds in a week, call the office.  Take your medicines as prescribed. If you have concerns about your medications, please call us before you stop taking them.   Eat low salt foods--Limit salt (sodium) to 2000 mg per day. This will help prevent your body from holding onto fluid. Read food labels as many processed foods have a lot of sodium, especially canned goods and prepackaged meats. If you would like some assistance choosing low sodium foods, we would be happy to set you up with a nutritionist.  Stay as active as you can everyday. Staying active will give you more energy and make your muscles stronger. Start with 5 minutes at a time and work your way up to 30 minutes a day. Break up your activities--do some in the morning and some in the afternoon. Start  with 3 days per week and work your way up to 5  days as you can.  If you have chest pain, feel short of breath, dizzy, or lightheaded, STOP. If you don't feel better after a short rest, call 911. If you do feel better, call the office to let us know you have symptoms with exercise.  Limit all fluids for the day to less than 2 liters. Fluid includes all drinks, coffee, juice, ice chips, soup, jello, and all other liquids.   If your weight and swelling does not go down, please call our office. You may need to increase your fluid pill. Please let us know the dose of torsemide you are on at home.  Would discuss medications such Farxiga or Jardiance (SGLT2 inhibitors) with your [place] doctor to see if you can be prescribed this through the [place]. They will need to monitor your kidney function.  Signed, Buford Dresser, MD PhD 11/01/2021 3:30 PM    [place]

## 2021-11-02 ENCOUNTER — Other Ambulatory Visit: Payer: Self-pay

## 2021-11-02 ENCOUNTER — Encounter (HOSPITAL_BASED_OUTPATIENT_CLINIC_OR_DEPARTMENT_OTHER): Payer: Self-pay

## 2021-11-02 ENCOUNTER — Emergency Department (HOSPITAL_BASED_OUTPATIENT_CLINIC_OR_DEPARTMENT_OTHER)
Admission: EM | Admit: 2021-11-02 | Discharge: 2021-11-02 | Disposition: A | Discharge status: Home / Self Care | Payer: No Typology Code available for payment source | Attending: Emergency Medicine | Admitting: Emergency Medicine

## 2021-11-02 ENCOUNTER — Emergency Department (HOSPITAL_BASED_OUTPATIENT_CLINIC_OR_DEPARTMENT_OTHER): Payer: No Typology Code available for payment source | Admitting: Radiology

## 2021-11-02 DIAGNOSIS — I509 Heart failure, unspecified: Secondary | ICD-10-CM | POA: Insufficient documentation

## 2021-11-02 DIAGNOSIS — R079 Chest pain, unspecified: Secondary | ICD-10-CM | POA: Insufficient documentation

## 2021-11-02 DIAGNOSIS — Z7901 Long term (current) use of anticoagulants: Secondary | ICD-10-CM | POA: Insufficient documentation

## 2021-11-02 DIAGNOSIS — I4891 Unspecified atrial fibrillation: Secondary | ICD-10-CM | POA: Diagnosis not present

## 2021-11-02 LAB — CBC
HCT: 39.5 % (ref 39.0–52.0)
Hemoglobin: 12.9 g/dL — ABNORMAL LOW (ref 13.0–17.0)
MCH: 28.9 pg (ref 26.0–34.0)
MCHC: 32.7 g/dL (ref 30.0–36.0)
MCV: 88.6 fL (ref 80.0–100.0)
Platelets: 218 10*3/uL (ref 150–400)
RBC: 4.46 MIL/uL (ref 4.22–5.81)
RDW: 14.9 % (ref 11.5–15.5)
WBC: 10.2 10*3/uL (ref 4.0–10.5)
nRBC: 0 % (ref 0.0–0.2)

## 2021-11-02 LAB — BASIC METABOLIC PANEL
Anion gap: 12 (ref 5–15)
BUN: 8 mg/dL (ref 8–23)
CO2: 26 mmol/L (ref 22–32)
Calcium: 11.5 mg/dL — ABNORMAL HIGH (ref 8.9–10.3)
Chloride: 97 mmol/L — ABNORMAL LOW (ref 98–111)
Creatinine, Ser: 1.07 mg/dL (ref 0.61–1.24)
GFR, Estimated: >60 mL/min (ref >60)
Glucose, Bld: 159 mg/dL — ABNORMAL HIGH (ref 70–99)
Potassium: 3.7 mmol/L (ref 3.5–5.1)
Sodium: 135 mmol/L (ref 135–145)

## 2021-11-02 LAB — TROPONIN I (HIGH SENSITIVITY)
Troponin I (High Sensitivity): 21 ng/L — ABNORMAL HIGH (ref <18)
Troponin I (High Sensitivity): 21 ng/L — ABNORMAL HIGH (ref <18)

## 2021-11-02 NOTE — ED Notes (Signed)
Patient verbalizes understanding of discharge instructions. Opportunity for questioning and answers were provided. Armband removed by staff, pt discharged from ED. Ambulated out to lobby  

## 2021-11-02 NOTE — ED Triage Notes (Signed)
Patient here POV from Home.  Endorses having CP at 1600 today. Endorses Pain is Left Chest and Intermittent in [place]. Associated with SOB.  History of Atrial Fib. No N/V/D.  NAD Noted during Triage. A&Ox4. GCS 15. BIB Wheelchair.

## 2021-11-02 NOTE — ED Notes (Signed)
MD at bedside. 

## 2021-11-02 NOTE — Discharge Instructions (Addendum)
Your tests today were normal.  There is no sign of a heart attack today.  Follow-up with your cardiologist in 1 week.  Return back to the ER if you have worsening symptoms worsening pain fevers trouble breathing or any additional concerns.

## 2021-11-02 NOTE — ED Provider Notes (Signed)
[facility] EMERGENCY DEPT Provider Note   CSN: [id number] Arrival date & time: 11/02/21  1647     History  Chief Complaint  Patient presents with   Chest Pain    Francis Cardenas is a 71 y.o. male.  Patient history of atrial fibrillation on Eliquis, congestive heart failure, heart disease, presents ER chief complaint of chest pain.  He states that he was laying in his recliner where he sleeps for the last 15 years he felt sharp sudden onset left-sided chest pain at the nipple area of his left chest.  Pain was lasting 2 to 3 seconds and then resolved it was intermittent for the last several hours.  Each episode last about 2 to 3 seconds.  Nothing seems to bring it on otherwise denies any fevers or cough no vomiting or diarrhea currently denies any chest pain.  Denies any fall or trauma.       Home Medications Prior to Admission medications   Medication Sig Start Date End Date Taking? Authorizing Provider  acetaminophen (TYLENOL) 325 MG tablet Take 2 tablets (650 mg total) by mouth every 6 (six) hours as needed. 09/27/21   Jeanell Sparrow, DO  albuterol (PROVENTIL HFA;VENTOLIN HFA) 108 (90 BASE) MCG/ACT inhaler Inhale 2 puffs into the lungs every 6 (six) hours as needed for wheezing.    [provider]  allopurinol (ZYLOPRIM) 100 MG tablet Take 200 mg by mouth daily. 08/03/21   [provider]  ALPRAZolam (XANAX XR) 1 MG 24 hr tablet Take 1 mg by mouth at bedtime.    [provider]  apixaban (ELIQUIS) 5 MG TABS tablet Take 1 tablet (5 mg total) by mouth 2 (two) times daily. 05/20/21   Terrilee Croak, MD  atorvastatin (LIPITOR) 80 MG tablet Take 40 mg by mouth daily with supper.    [provider]  Carboxymethylcellulose Sodium 0.25 % SOLN Place 1 drop into both eyes 3 (three) times daily as needed (dry eyes/ irritation).    [provider]  Cholecalciferol (VITAMIN D3 PO) Take 1 tablet by mouth daily with supper.    [provider]  colchicine 0.6 MG tablet Take 1 tablet (0.6 mg total) by mouth daily. Patient not taking: Reported on 11/01/2021 10/05/21   [name], MD  diclofenac Sodium (VOLTAREN) 1 % GEL Apply 2 g topically 4 (four) times daily. 05/20/21   Terrilee Croak, MD  Ensure Max Protein (ENSURE MAX PROTEIN) LIQD Take 330 mLs (11 oz total) by mouth 2 (two) times daily. Patient not taking: Reported on 11/01/2021 12/12/18   [name], [name], MD  folic acid (FOLVITE) 1 MG tablet Take 1 tablet (1 mg total) by mouth daily. Patient not taking: Reported on 08/31/2021 05/21/21   Terrilee Croak, MD  furosemide (LASIX) 40 MG tablet Take 1 tablet (40 mg total) by mouth 2 (two) times daily. Patient not taking: Reported on 08/31/2021 05/20/21 08/18/21  Terrilee Croak, MD  gabapentin (NEURONTIN) 300 MG capsule Take 300 mg by mouth 3 (three) times daily.    [provider]  guaifenesin (HUMIBID E) 400 MG TABS tablet Take 400 mg by mouth every morning.    [provider]  Magnesium Oxide 420 MG TABS Take 420 mg by mouth daily with supper. Patient not taking: Reported on 11/01/2021    [provider]  metoprolol succinate (TOPROL-XL) 25 MG 24 hr tablet Take 0.5 tablets (12.5 mg total) by mouth daily. 09/04/21   Cherene Altes, MD  mometasone-formoterol (DULERA) 100-5  MCG/ACT AERO Inhale 2 puffs into the lungs 2 (two) times daily. 05/20/21   Terrilee Croak, MD  Multiple Vitamins-Minerals (MULTIVITAMIN WITH MINERALS) tablet Take 1 tablet by mouth daily with lunch.     [provider]  omeprazole (PRILOSEC) 20 MG capsule Take 20 mg by mouth daily with supper.    [provider]  oxyCODONE (ROXICODONE) 5 MG immediate release tablet Take 1 tablet (5 mg total) by mouth every 4 (four) hours as needed for severe pain. Patient not taking: Reported on 11/01/2021 09/27/21   [name], DO  potassium chloride SA (KLOR-CON M) 20 MEQ tablet Take 1 tablet (20 mEq total) by mouth daily with  supper. 05/20/21   Terrilee Croak, MD  spironolactone (ALDACTONE) 25 MG tablet Take 25 mg by mouth every morning.    [provider]  thiamine 100 MG tablet Take 1 tablet (100 mg total) by mouth daily. Patient taking differently: Take 100 mg by mouth daily. Vitamin B1 06/05/20   [name], MD  vitamin B-12 (CYANOCOBALAMIN) 500 MCG tablet Take 500 mcg by mouth daily with supper.    [provider]      Allergies    Oxcarbazepine and Zolpidem    Review of Systems   Review of Systems  Constitutional:  Negative for fever.  HENT:  Negative for ear pain and sore throat.   Eyes:  Negative for pain.  Respiratory:  Negative for cough.   Cardiovascular:  Positive for chest pain.  Gastrointestinal:  Negative for abdominal pain.  Genitourinary:  Negative for flank pain.  Musculoskeletal:  Negative for back pain.  Skin:  Negative for color change and rash.  Neurological:  Negative for syncope.  All other systems reviewed and are negative.   Physical Exam Updated Vital Signs BP 127/64   Pulse 67   Temp 100.1 F (37.8 C)   Resp (!) 23   Ht '5\' 10"'$  (1.778 m)   Wt 131.4 kg   SpO2 97%   BMI 41.57 kg/m  Physical Exam Constitutional:      Appearance: He is well-developed.  HENT:     Head: Normocephalic.     Nose: Nose normal.  Eyes:     Extraocular Movements: Extraocular movements intact.  Cardiovascular:     Rate and Rhythm: Normal rate.  Pulmonary:     Effort: Pulmonary effort is normal.  Abdominal:     Tenderness: There is no abdominal tenderness. There is no guarding or rebound.  Skin:    Coloration: Skin is not jaundiced.  Neurological:     Mental Status: He is alert. Mental status is at baseline.     ED Results / Procedures / Treatments   Labs (all labs ordered are listed, but only abnormal results are displayed) Labs Reviewed  BASIC METABOLIC PANEL - Abnormal; Notable for the following components:      Result Value   Chloride 97 (*)    Glucose, Bld  159 (*)    Calcium 11.5 (*)    All other components within normal limits  CBC - Abnormal; Notable for the following components:   Hemoglobin 12.9 (*)    All other components within normal limits  TROPONIN I (HIGH SENSITIVITY) - Abnormal; Notable for the following components:   Troponin I (High Sensitivity) 21 (*)    All other components within normal limits  TROPONIN I (HIGH SENSITIVITY) - Abnormal; Notable for the following components:   Troponin I (High Sensitivity) 21 (*)    All other  components within normal limits    EKG EKG Interpretation  Date/Time:  Tuesday November 02 2021 16:54:09 EDT Ventricular Rate:  81 PR Interval:    QRS Duration: 88 QT Interval:  388 QTC Calculation: 450 R Axis:   48 Text Interpretation: Atrial fibrillation Low voltage QRS Cannot rule out Anterior infarct , age undetermined Abnormal ECG No previous ECGs available Confirmed by Thamas Jaegers ([id number]) on 11/02/2021 7:28:39 PM  Radiology DG Chest 2 View  Result Date: 11/02/2021 CLINICAL DATA:  Chest pain EXAM: CHEST - 2 VIEW COMPARISON:  09/27/2021 FINDINGS: The heart size and mediastinal contours are within normal limits. Both lungs are clear. The visualized skeletal structures are unremarkable. IMPRESSION: No active cardiopulmonary disease. Electronically Signed   By: Donavan Foil M.D.   On: 11/02/2021 17:12    Procedures Procedures    Medications Ordered in ED Medications - No data to display  ED Course/ Medical Decision Making/ A&P                           Medical Decision Making Amount and/or Complexity of Data Reviewed Labs: ordered. Radiology: ordered.   Cardiac monitoring showing atrial fibrillation.  Review of record shows office visit yesterday for CHF.  Mordecai Rasmussen studies were sent EKG shows atrial fibrillation no ST elevation depressions mildly tachycardic rate.  Labs showed troponins are flat x2 chemistry unremarkable chest x-ray shows no acute findings.  History is atypical for acute  coronary syndrome.  Patient remains pain-free.  I doubt cardiac event at this time.  Recommending outpatient follow-up with his cardiologist this week.  Recommending immediate return for worsening pain fevers or any additional concerns.        Final Clinical Impression(s) / ED Diagnoses Final diagnoses:  Chest pain, unspecified type    Rx / DC Orders ED Discharge Orders     None         Luna Fuse, MD 11/02/21 1931

## 2021-11-09 ENCOUNTER — Ambulatory Visit (INDEPENDENT_AMBULATORY_CARE_PROVIDER_SITE_OTHER): Payer: No Typology Code available for payment source | Admitting: Family

## 2021-11-09 ENCOUNTER — Encounter (HOSPITAL_BASED_OUTPATIENT_CLINIC_OR_DEPARTMENT_OTHER): Payer: Self-pay | Admitting: Family

## 2021-11-09 VITALS — BP 126/60 | HR 70 | Ht 70.0 in | Wt 282.2 lb

## 2021-11-09 DIAGNOSIS — R0789 Other chest pain: Secondary | ICD-10-CM

## 2021-11-09 DIAGNOSIS — I5032 Chronic diastolic (congestive) heart failure: Secondary | ICD-10-CM | POA: Diagnosis not present

## 2021-11-09 DIAGNOSIS — I10 Essential (primary) hypertension: Secondary | ICD-10-CM | POA: Diagnosis not present

## 2021-11-09 DIAGNOSIS — I4811 Longstanding persistent atrial fibrillation: Secondary | ICD-10-CM

## 2021-11-09 DIAGNOSIS — D6859 Other primary thrombophilia: Secondary | ICD-10-CM

## 2021-11-09 DIAGNOSIS — I272 Pulmonary hypertension, unspecified: Secondary | ICD-10-CM

## 2021-11-09 DIAGNOSIS — E782 Mixed hyperlipidemia: Secondary | ICD-10-CM

## 2021-11-09 NOTE — Patient Instructions (Signed)
Medication Instructions:   Continue your current medications.   If you will please bring your pill bottles to your next appointment so we can ensure your medication list is correct.  [facility], ask them about a group of medications called SGLT2i [place] Glade or Jardiance) as they can help with heart failure. They help prevent your heart from holding onto excess fluid.    *If you need a refill on your cardiac medications before your next appointment, please call your pharmacy*  Lab Work: None ordered today.   Testing/Procedures: None ordered today.   Your x-ray in the emergency room looked great!  Your EKG in the emergency room was stable compared to previous.   Follow-Up: At [facility], you and your health needs are our priority.  As part of our continuing mission to provide you with exceptional heart care, we have created designated Provider Care Teams.  These Care Teams include your primary Cardiologist (physician) and Advanced Practice Providers (APPs -  Physician Assistants and Nurse Practitioners) who all work together to provide you with the care you need, when you need it.  We recommend signing up for the patient portal called "MyChart".  Sign up information is provided on this After Visit Summary.  MyChart is used to connect with patients for Virtual Visits (Telemedicine).  Patients are able to view lab/test results, encounter notes, upcoming appointments, etc.  Non-urgent messages can be sent to your provider as well.   To learn more about what you can do with MyChart, go to [email or web address].    Your next appointment:   As scheduled with Dr. Harrell Gave   Other Instructions  Heart Healthy Diet Recommendations: A low-salt diet is recommended. Meats should be grilled, baked, or boiled. Avoid fried foods. Focus on lean protein sources like fish or chicken with vegetables and fruits. The American Heart Association is a Microbiologist!   American Heart Association Diet and Lifeystyle Recommendations  Recommend drinking less than 2 liters (64 oz) of fluid per day.   Exercise recommendations: The American Heart Association recommends 150 minutes of moderate intensity exercise weekly. Try 30 minutes of moderate intensity exercise 4-5 times per week. This could include walking, jogging, or swimming.   Low-Purine Eating Plan To help prevent gout A low-purine eating plan involves making food choices to limit your purine intake. Purine is a kind of uric acid. Too much uric acid in your blood can cause certain conditions, such as gout and kidney stones. Eating a low-purine diet may help control these conditions. What are tips for following this plan? Shopping Avoid buying products that contain high-fructose corn syrup. Check for this on food labels. It is commonly found in many processed foods and soft drinks. Be sure to check for it in baked goods such as cookies, canned fruits, and cereals and cereal bars. Avoid buying veal, chicken breast with skin, lamb, and organ meats such as liver. These types of meats tend to have the highest purine content. Choose dairy products. These may lower uric acid levels. Avoid certain types of fish. Not all fish and seafood have high purine content. Examples with high purine content include anchovies, trout, tuna, sardines, and salmon. Avoid buying beverages that contain alcohol, particularly beer and hard liquor. Alcohol can affect the way your body gets rid of uric acid. Meal planning  Learn which foods do or do not affect you. If you find out that a food tends to cause your gout symptoms  to flare up, avoid eating that food. You can enjoy foods that do not cause problems. If you have any questions about a food item, talk with your dietitian or health care provider. Reduce the overall amount of meat in your diet. When you do eat meat, choose ones with lower purine content. Include plenty of fruits  and vegetables. Although some vegetables may have a high purine content--such as asparagus, mushrooms, spinach, or cauliflower--it has been shown that these do not contribute to uric acid blood levels as much. Consume at least 1 dairy serving a day. This has been shown to decrease uric acid levels. General information If you drink alcohol: Limit how much you have to: 0-1 drink a day for women who are not pregnant. 0-2 drinks a day for men. Know how much alcohol is in a drink. In the [place] one drink equals one 12 oz bottle of beer (355 mL), one 5 oz glass of wine (148 mL), or one 1 oz glass of hard liquor (44 mL). Drink plenty of water. Try to drink enough to keep your urine pale yellow. Fluids can help remove uric acid from your body. Work with your health care provider and dietitian to develop a plan to achieve or maintain a healthy weight. Losing weight may help reduce uric acid in your blood. What foods are recommended? The following are some types of foods that are good choices when limiting purine intake: Fresh or frozen fruits and vegetables. Whole grains, breads, cereals, and pasta. Rice. Beans, peas, legumes. Nuts and seeds. Dairy products. Fats and oils. The items listed above may not be a complete list. Talk with a dietitian about what dietary choices are best for you. What foods are not recommended? Limit your intake of foods high in purines, including: Beer and other alcohol. Meat-based gravy or sauce. Canned or fresh fish, such as: Anchovies, sardines, herring, salmon, and tuna. Mussels and scallops. Codfish, trout, and haddock. Bacon, veal, chicken breast with skin, and lamb. Organ meats, such as: Liver or kidney. Tripe. Sweetbreads (thymus gland or pancreas). Wild Clinical biochemist. Yeast or yeast extract supplements. Drinks sweetened with high-fructose corn syrup, such as soda. Processed foods made with high-fructose corn syrup. The items listed above may not be a  complete list of foods and beverages you should limit. Contact a dietitian for more information. Summary Eating a low-purine diet may help control conditions caused by too much uric acid in the body, such as gout or kidney stones. Choose low-purine foods, limit alcohol, and limit high-fructose corn syrup. You will learn over time which foods do or do not affect you. If you find out that a food tends to cause your gout symptoms to flare up, avoid eating that food. This information is not intended to replace advice given to you by your health care provider. Make sure you discuss any questions you have with your health care provider. Document Revised: 04/22/2021 Document Reviewed: 04/22/2021 Elsevier Patient Education  [place].

## 2021-11-09 NOTE — Progress Notes (Signed)
Office Visit    Patient Name: Francis Cardenas Date of Encounter: 11/09/2021  PCP:  Gerome Sam, MD   [place]  Cardiologist:  Buford Dresser, MD  Advanced Practice Provider:  No care team member to display Electrophysiologist:  None      Chief Complaint    Francis Cardenas is a 71 y.o. male presents today for follow-up after ED visit for chest pain  Past Medical History    Past Medical History:  Diagnosis Date   BPH (benign prostatic hyperplasia)    Cancer ([place])    prostate   COPD (chronic obstructive pulmonary disease) ([place])    Diabetes mellitus without complication ([place])    Hypercholesteremia    Hypertension    Past Surgical History:  Procedure Laterality Date   ACHILLES TENDON REPAIR Left    KNEE ARTHROSCOPY Left    RIGHT/LEFT HEART CATH AND CORONARY ANGIOGRAPHY N/A 04/09/2019   Procedure: RIGHT/LEFT HEART CATH AND CORONARY ANGIOGRAPHY;  Surgeon: Martinique, Peter M, MD;  Location: [facility];  Service: Cardiovascular;  Laterality: N/A;   TONSILLECTOMY     WRIST FRACTURE SURGERY Left     Allergies  Allergies  Allergen Reactions   Oxcarbazepine Other (See Comments)    Dizziness/ lightheaded   Zolpidem Nausea Only    History of Present Illness    Francis Cardenas is a 71 y.o. male with a hx of hypertension, COPD, chronic diastolic heart failure, hypertension, hyperlipidemia, DM 2, alcohol abuse, atrial fibrillation, pulmonary hypertension last seen 11/01/2021 by Dr. Harrell Gave.  Previously seen by Dr. Radford Pax, Dr. Acie Fredrickson, Dr. Harrington Challenger. Most recent cardiac catheterization 2020 with normal coronary anatomy, normal LV systolic function, moderate pulmonary hypertension.  Admission 08/2021 with exacerbation of diastolic heart failure treated with Lasix.  He had recently run out of his medications.  Most recent echo 08/31/2021 EF 60 to 65%, no RWMA, indeterminate diastolic parameters, RV normal size and function, mildly elevated  PASP, LA mildly dilated, RA severely dilated, RA pressure 15 mmHg, no significant valvular abnormalities.  Established care with Dr. Harrell Gave 11/01/2021.  His chief complaint at that time was gout.  He was encouraged to follow-up with his PCP at the [place].  He did note drinking 8-9 beers per day since he was 71 years old and was not interested in cutting back.  Relationship between this and heart failure chills atrial fibrillation was discussed in detail.  ED visit 11/02/21 for chest pain.  Occurred while laying in the recliner which he has slept in for the last 17 years  It was left-sided at the left nipple area of the chest lasting 2 to 3 seconds and intermittent for several hours.  Troponin 21 ? 21, CXR unremarkable, CBC stable mild anemia Hb 12.9, BMP with calcium 11.5 but otherwise unremarkable.  He presents today for follow-up.Notes he was laying in recliner when chest pain occurred. It has not recurred. Felt like a flutter in his left chest. This has happened previously but not to this length of time. Not associated with dyspnea. Did not radiate. Discussed that this was atypical for angina. He reports weights at home have been overall stable. Did note some increased lower extremity edema in setting of prednisone use for gout.   EKGs/Labs/Other Studies Reviewed:   The following studies were reviewed today:  Echo 08/31/21:    1. Left ventricular ejection fraction, by estimation, is 60 to 65%. The  left ventricle has normal function. The left ventricle has no  regional  wall motion abnormalities. Left ventricular diastolic parameters are  indeterminate.   2. Right ventricular systolic function is normal. The right ventricular  size is mildly enlarged. There is mildly elevated pulmonary artery  systolic pressure.   3. Left atrial size was mildly dilated.   4. Right atrial size was severely dilated.   5. The mitral valve is normal in structure. No evidence of mitral valve  regurgitation. No  evidence of mitral stenosis.   6. The aortic valve is normal in structure. Aortic valve regurgitation is  not visualized. No aortic stenosis is present.   7. The inferior vena cava is dilated in size with <50% respiratory  variability, suggesting right atrial pressure of 15 mmHg.    Cath 4098 LV end diastolic pressure is moderately elevated. The left ventricular systolic function is normal. The left ventricular ejection fraction is 55-65% by visual estimate. There is trivial (1+) mitral regurgitation. There is no aortic valve stenosis. Hemodynamic findings consistent with moderate pulmonary hypertension.   1. Normal coronary anatomy 2. Normal LV systolic function 3. Moderately elevated LV filling pressures. Large V waves to 58 mm Hg c/w LV restriction. No significant MR noted. 4. No AV gradient. 5. Moderate pulmonary HTN 6. Preserved cardiac output. Index 3.     EKG:  No EKG today.   Recent Labs: 05/15/2021: TSH 2.353 08/30/2021: ALT 10; B Natriuretic Peptide 529.7 09/02/2021: Magnesium 2.4 11/02/2021: BUN 8; Creatinine, Ser 1.07; Hemoglobin 12.9; Platelets 218; Potassium 3.7; Sodium 135  Recent Lipid Panel    Component Value Date/Time   CHOL 125 04/10/2019 0541   TRIG 103 04/10/2019 0541   HDL 41 04/10/2019 0541   CHOLHDL 3.0 04/10/2019 0541   VLDL 21 04/10/2019 0541   LDLCALC 63 04/10/2019 0541    Risk Assessment/Calculations:   CHA2DS2-VASc Score = 4   This indicates a 4.8% annual risk of stroke. The patient's score is based upon: CHF History: 1 HTN History: 1 Diabetes History: 1 Stroke History: 0 Vascular Disease History: 0 Age Score: 1 Gender Score: 0    Home Medications   Current Meds  Medication Sig   acetaminophen (TYLENOL) 325 MG tablet Take 2 tablets (650 mg total) by mouth every 6 (six) hours as needed.   albuterol (PROVENTIL HFA;VENTOLIN HFA) 108 (90 BASE) MCG/ACT inhaler Inhale 2 puffs into the lungs every 6 (six) hours as needed for wheezing.    allopurinol (ZYLOPRIM) 300 MG tablet Take 1 tablet by mouth daily.   ALPRAZolam (XANAX XR) 1 MG 24 hr tablet Take 1 mg by mouth at bedtime.   amLODipine (NORVASC) 10 MG tablet TAKE ONE TABLET BY MOUTH DAILY HOLD DOSE FOR SYSTOLIC BLOOD PRESSURE BELOW 100; AVOID GRAPEFRUIT PRODUCTS   apixaban (ELIQUIS) 5 MG TABS tablet Take 1 tablet (5 mg total) by mouth 2 (two) times daily.   atorvastatin (LIPITOR) 80 MG tablet Take 40 mg by mouth daily with supper.   Carboxymethylcellulose Sodium 0.25 % SOLN Place 1 drop into both eyes 3 (three) times daily as needed (dry eyes/ irritation).   Cholecalciferol (VITAMIN D3 PO) Take 1 tablet by mouth daily with supper.   diclofenac Sodium (VOLTAREN) 1 % GEL Apply 2 g topically 4 (four) times daily.   Ensure Max Protein (ENSURE MAX PROTEIN) LIQD Take 330 mLs (11 oz total) by mouth 2 (two) times daily.   gabapentin (NEURONTIN) 300 MG capsule Take 300 mg by mouth 3 (three) times daily.   guaifenesin (HUMIBID E) 400 MG TABS tablet Take 400  mg by mouth every morning.   hydrALAZINE (APRESOLINE) 25 MG tablet TAKE ONE TABLET BY MOUTH DAILY FOR HYPERTENSION - HOLD DOSE FOR SYSTOLIC BLOOD PRESSURE LESS THAN 100   Magnesium Oxide 420 MG TABS Take 420 mg by mouth daily with supper.   metoprolol succinate (TOPROL-XL) 25 MG 24 hr tablet Take 0.5 tablets (12.5 mg total) by mouth daily.   mometasone-formoterol (DULERA) 100-5 MCG/ACT AERO Inhale 2 puffs into the lungs 2 (two) times daily.   Multiple Vitamins-Minerals (MULTIVITAMIN WITH MINERALS) tablet Take 1 tablet by mouth daily with lunch.    omeprazole (PRILOSEC) 20 MG capsule Take 20 mg by mouth daily with supper.   potassium chloride SA (KLOR-CON M) 20 MEQ tablet Take 1 tablet (20 mEq total) by mouth daily with supper.   spironolactone (ALDACTONE) 25 MG tablet Take 25 mg by mouth every morning.   thiamine 100 MG tablet Take 1 tablet (100 mg total) by mouth daily. (Patient taking differently: Take 100 mg by mouth daily. Vitamin  B1)   torsemide (DEMADEX) 20 MG tablet TAKE ONE TABLET BY MOUTH TWICE A DAY - HOLD DOSE FOR SYSTOLIC BLOOD PRESSURE LESS THAN 100   vitamin B-12 (CYANOCOBALAMIN) 500 MCG tablet Take 500 mcg by mouth daily with supper.   [DISCONTINUED] allopurinol (ZYLOPRIM) 100 MG tablet Take 200 mg by mouth daily.     Review of Systems     All other systems reviewed and are otherwise negative except as noted above.  Physical Exam    VS:  BP 126/60 (BP Location: Right Arm, Patient Position: Sitting, Cuff Size: Large)   Pulse 70   Ht '5\' 10"'$  (1.778 m)   Wt 282 lb 3.2 oz (128 kg)   SpO2 94%   BMI 40.49 kg/m  , BMI Body mass index is 40.49 kg/m.  Wt Readings from Last 3 Encounters:  11/09/21 282 lb 3.2 oz (128 kg)  11/02/21 289 lb 11 oz (131.4 kg)  09/02/21 289 lb 11 oz (131.4 kg)    GEN: Well nourished, overweight, well developed, in no acute distress. HEENT: normal. Neck: Supple, no JVD, carotid bruits, or masses. Cardiac: IRIR, no murmurs, rubs, or gallops. No clubbing, cyanosis, edema.  Radials/PT 2+ and equal bilaterally.  Respiratory:  Respirations regular and unlabored, clear to auscultation bilaterally. GI: Soft, nontender, nondistended. MS: No deformity or atrophy. Skin: Warm and dry, no rash. Neuro:  Strength and sensation are intact. Psych: Normal affect.  Assessment & Plan    Chest pain -cardiac catheterization 2020 with no coronary artery disease. Recent ED visit with atypical chest pain as it occurred at rest. HS troponin 21 ? 21. No indication for ischemic evaluation. Reassurance provided.   Pulmonary hypertension /diastolic heart failure-  Grossly euvolemic on exam. Reports home weight has been stable. Uncertain of Torsemide dosing. Continue Spironolactone, Metoprolol. Could consider SGLT2i - recommend he discuss with provider at the [place].  Alcohol abuse - Cessation encouraged. Has been drinking since he was 71 years old. Not interested in quitting at this time.   HTN - BP well  controlled. Continue current antihypertensive regimen.    HLD - Continue Atorvastatin as prescribed by provider at [facility]. Denies myalgias.   Atrial fibrillation-  Continue Metoprolol '25mg'$  QD. Rate controlled today. Continue Eliquis '5mg'$  BID. Denies bleeding complications. 10/2021 Hb 12.9 stable mild anemia. CHA2DS2-VASc Score = 4 [CHF History: 1, HTN History: 1, Diabetes History: 1, Stroke History: 0, Vascular Disease History: 0, Age Score: 1, Gender Score: 0].  Therefore, the patient's  annual risk of stroke is 4.8 %.      Disposition: Follow up  as scheduled in September  with Buford Dresser, MD   Signed, Loel Dubonnet, NP 11/09/2021, 8:51 AM [place]

## 2022-01-09 IMAGING — DX DG CHEST 1V PORT
1 series · 2 of 2 positions shown · non-contrast
Comparison: Prior chest radiographs 12/16/2019 and earlier.

CLINICAL DATA: Provided history: Shortness of breath.

EXAM:
PORTABLE CHEST 1 VIEW

[Series 1: chest · 0.14mm/px · 2 of 2 slices shown]
[im 1/2]
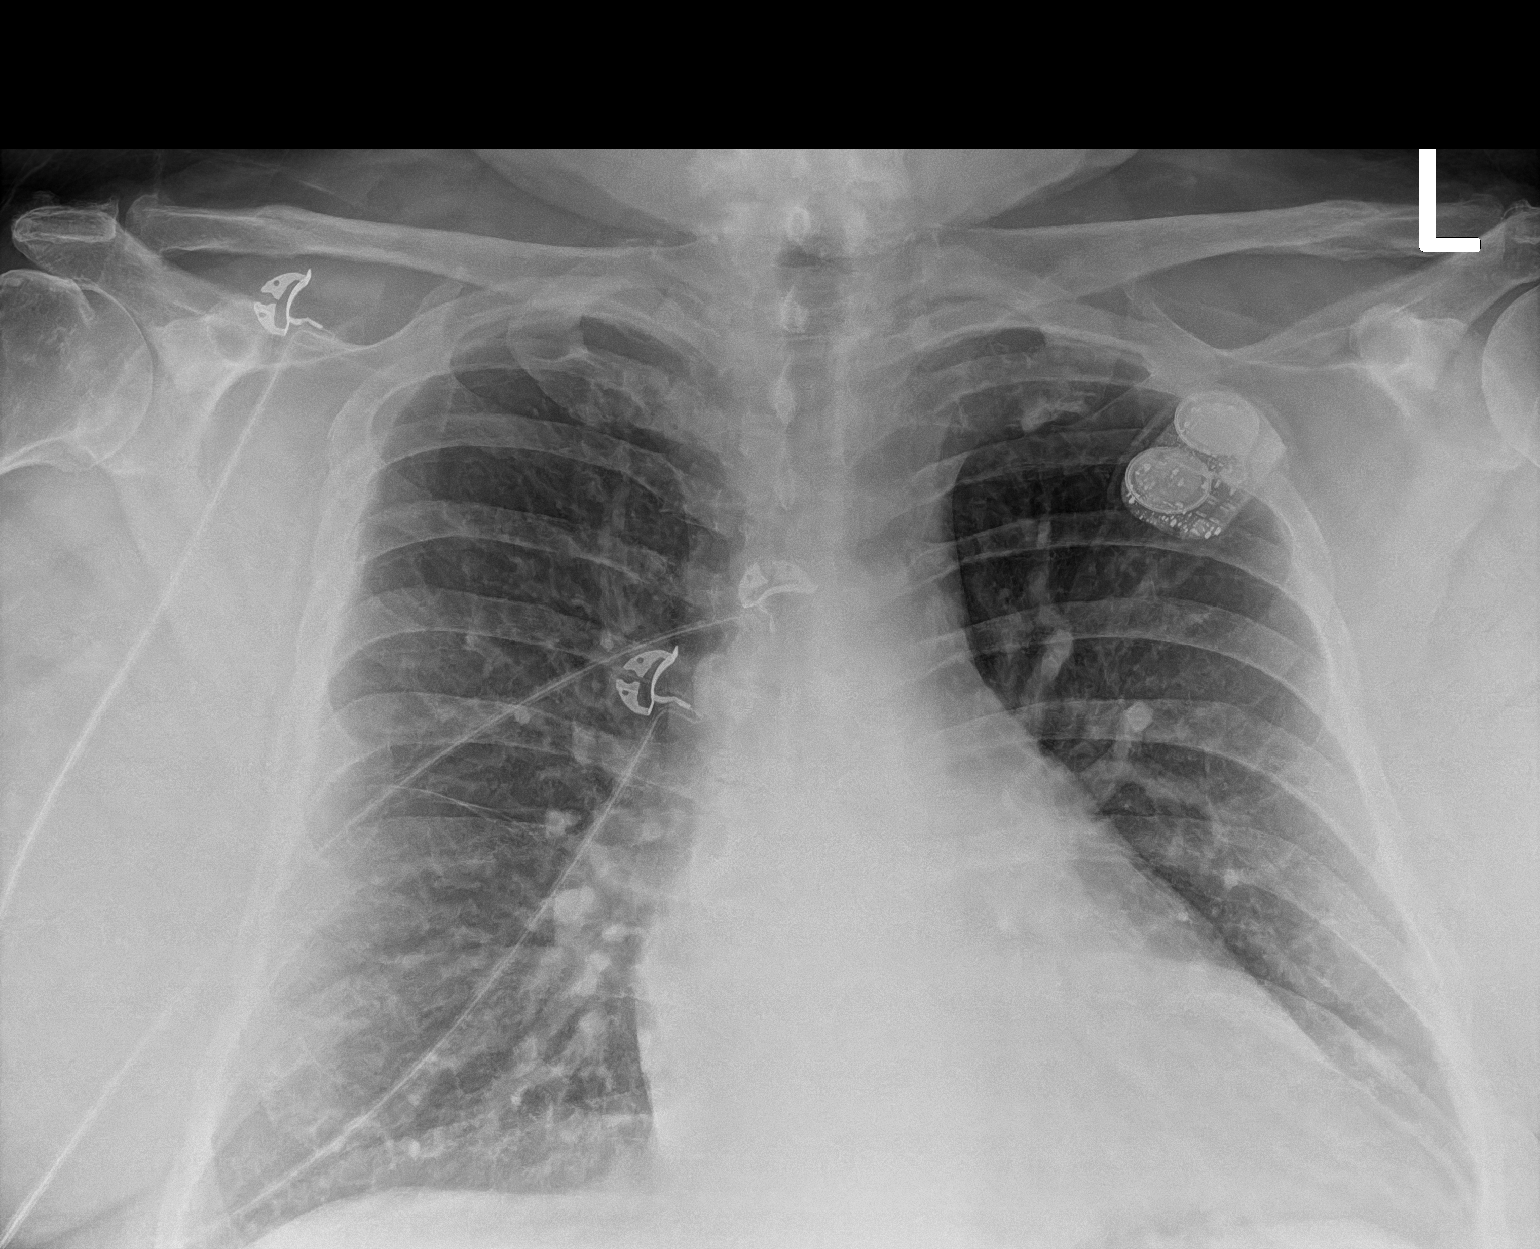
[im 2/2]
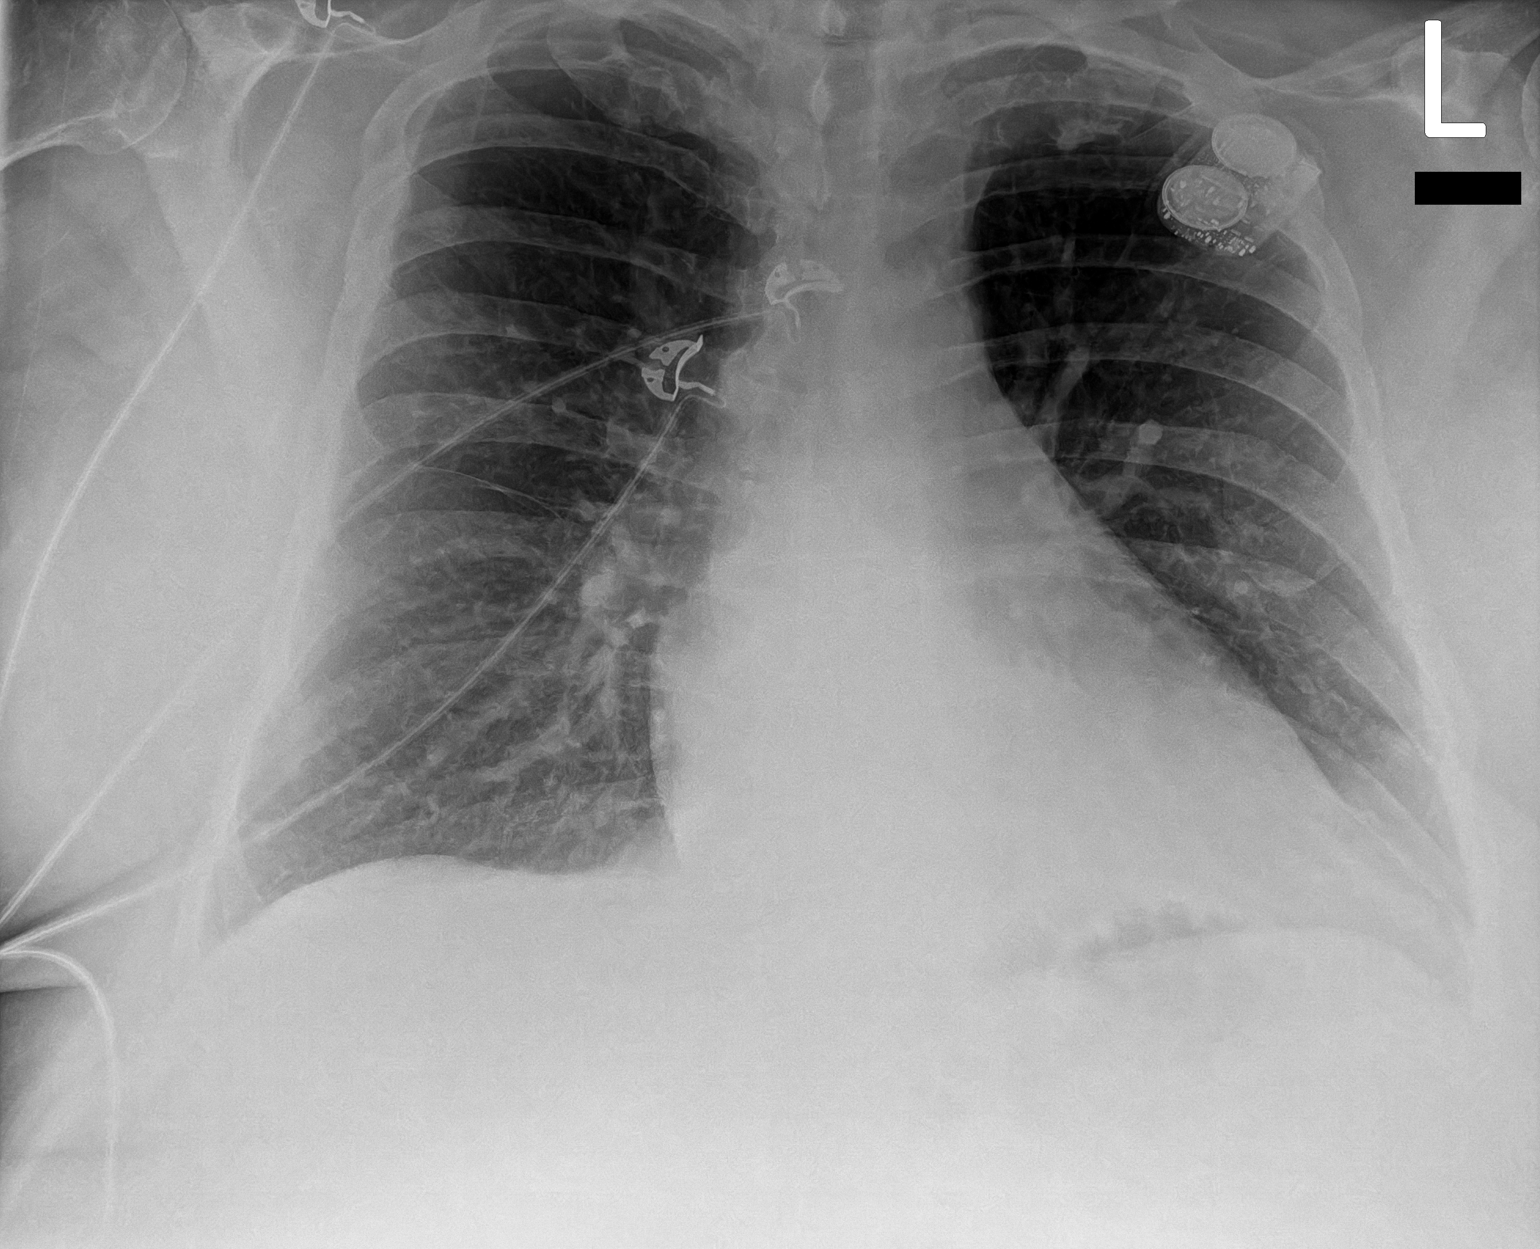

[2 of 2 positions shown; findings below may reference images not displayed]

FINDINGS: An electronic device projects over the left upper chest. Heart size
within normal limits. Central pulmonary vascular congestion without
overt pulmonary edema. No appreciable airspace consolidation. No
evidence of pleural effusion or pneumothorax. No acute bony
abnormality identified.
IMPRESSION: Central pulmonary vascular congestion without overt pulmonary edema.

No appreciable airspace consolidation.

## 2022-01-10 IMAGING — DX DG CHEST 1V PORT
1 series · 1 of 1 positions shown · non-contrast
Comparison: Film from the previous day.

CLINICAL DATA: Fevers

EXAM:
PORTABLE CHEST 1 VIEW

[chest]
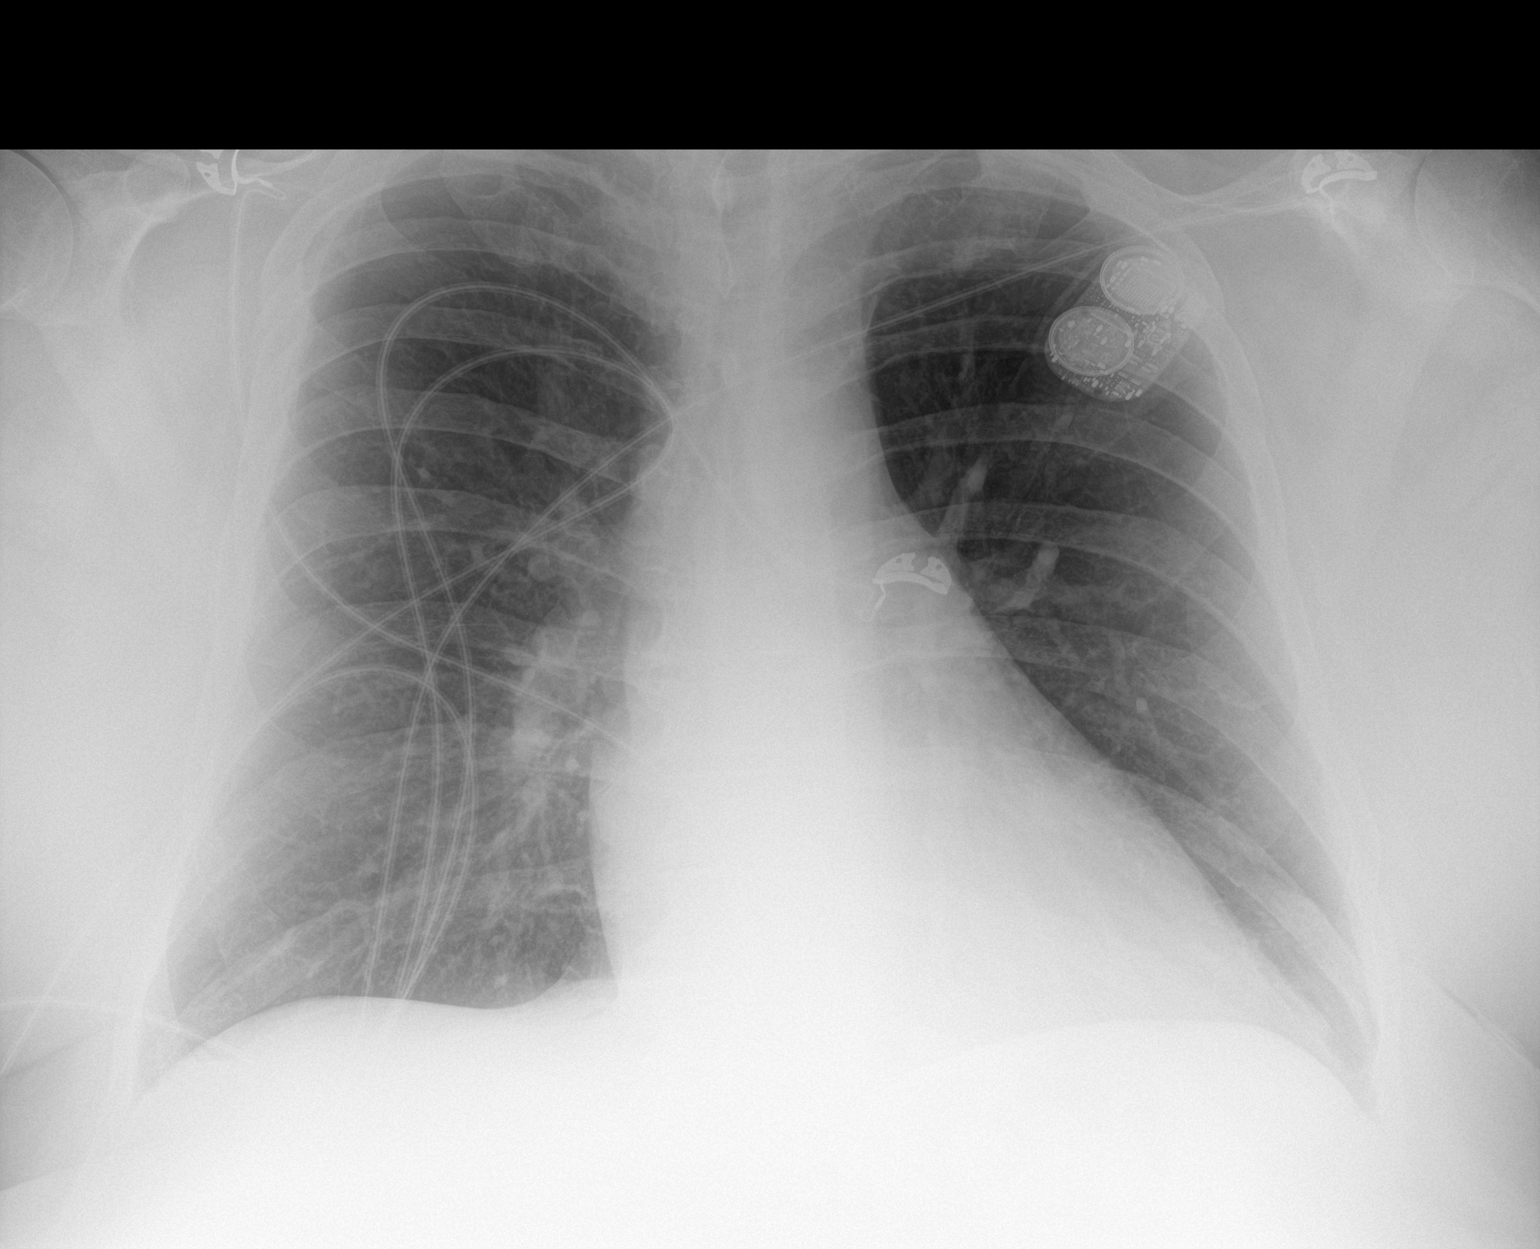

[1 of 1 positions shown; findings below may reference images not displayed]

FINDINGS: Cardiac shadow is stable. Lungs are well aerated bilaterally. No
focal infiltrate or sizable effusion is seen. No bony abnormality is
noted. Previously seen vascular congestion has resolved.
IMPRESSION: Resolution of previously seen vascular congestion. No acute
abnormality noted.

## 2022-01-11 ENCOUNTER — Emergency Department (HOSPITAL_COMMUNITY): Payer: No Typology Code available for payment source

## 2022-01-11 ENCOUNTER — Encounter (HOSPITAL_COMMUNITY): Payer: Self-pay

## 2022-01-11 ENCOUNTER — Emergency Department (HOSPITAL_COMMUNITY)
Admission: EM | Admit: 2022-01-11 | Discharge: 2022-01-11 | Disposition: A | Discharge status: Home / Self Care | Payer: No Typology Code available for payment source | Attending: Emergency Medicine | Admitting: Emergency Medicine

## 2022-01-11 DIAGNOSIS — M109 Gout, unspecified: Secondary | ICD-10-CM | POA: Diagnosis present

## 2022-01-11 DIAGNOSIS — Z7901 Long term (current) use of anticoagulants: Secondary | ICD-10-CM | POA: Insufficient documentation

## 2022-01-11 DIAGNOSIS — E119 Type 2 diabetes mellitus without complications: Secondary | ICD-10-CM | POA: Insufficient documentation

## 2022-01-11 DIAGNOSIS — I509 Heart failure, unspecified: Secondary | ICD-10-CM | POA: Diagnosis not present

## 2022-01-11 DIAGNOSIS — I110 Hypertensive heart disease with heart failure: Secondary | ICD-10-CM | POA: Diagnosis not present

## 2022-01-11 DIAGNOSIS — J449 Chronic obstructive pulmonary disease, unspecified: Secondary | ICD-10-CM | POA: Insufficient documentation

## 2022-01-11 LAB — BASIC METABOLIC PANEL
Anion gap: 13 (ref 5–15)
BUN: 18 mg/dL (ref 8–23)
CO2: 26 mmol/L (ref 22–32)
Calcium: 11.3 mg/dL — ABNORMAL HIGH (ref 8.9–10.3)
Chloride: 100 mmol/L (ref 98–111)
Creatinine, Ser: 1.22 mg/dL (ref 0.61–1.24)
GFR, Estimated: >60 mL/min (ref >60)
Glucose, Bld: 147 mg/dL — ABNORMAL HIGH (ref 70–99)
Potassium: 3.3 mmol/L — ABNORMAL LOW (ref 3.5–5.1)
Sodium: 139 mmol/L (ref 135–145)

## 2022-01-11 LAB — CBC
HCT: 46.6 % (ref 39.0–52.0)
Hemoglobin: 15.2 g/dL (ref 13.0–17.0)
MCH: 29.7 pg (ref 26.0–34.0)
MCHC: 32.6 g/dL (ref 30.0–36.0)
MCV: 91.2 fL (ref 80.0–100.0)
Platelets: 328 10*3/uL (ref 150–400)
RBC: 5.11 MIL/uL (ref 4.22–5.81)
RDW: 16.7 % — ABNORMAL HIGH (ref 11.5–15.5)
WBC: 12.7 10*3/uL — ABNORMAL HIGH (ref 4.0–10.5)
nRBC: 0.6 % — ABNORMAL HIGH (ref 0.0–0.2)

## 2022-01-11 MED ORDER — PREDNISONE 10 MG (21) PO TBPK: ORAL_TABLET | Freq: Every day | ORAL | 0 refills | Status: DC

## 2022-01-11 MED ORDER — OXYCODONE-ACETAMINOPHEN 5-325 MG PO TABS: 1.0000 | ORAL_TABLET | Freq: Four times a day (QID) | ORAL | 0 refills | Status: DC | PRN

## 2022-01-11 MED ORDER — COLCHICINE 0.6 MG PO TABS
0.6000 mg | ORAL_TABLET | Freq: Once | ORAL | Status: AC
  Administered 2022-01-11: 0.6 mg via ORAL
  Filled 2022-01-11: qty 1

## 2022-01-11 MED ORDER — POTASSIUM CHLORIDE CRYS ER 20 MEQ PO TBCR
20.0000 meq | EXTENDED_RELEASE_TABLET | Freq: Once | ORAL | Status: AC
Start: 2022-01-11 — End: 2022-01-11
  Administered 2022-01-11: 20 meq via ORAL
  Filled 2022-01-11: qty 1

## 2022-01-11 MED ORDER — PREDNISONE 20 MG PO TABS
60.0000 mg | ORAL_TABLET | Freq: Once | ORAL | Status: AC
Start: 2022-01-11 — End: 2022-01-11
  Administered 2022-01-11: 60 mg via ORAL
  Filled 2022-01-11: qty 3

## 2022-01-11 MED ORDER — MORPHINE SULFATE (PF) 4 MG/ML IV SOLN: 4.0000 mg | Freq: Once | INTRAVENOUS | Status: DC

## 2022-01-11 MED ORDER — OXYCODONE-ACETAMINOPHEN 5-325 MG PO TABS
2.0000 | ORAL_TABLET | Freq: Once | ORAL | Status: AC
  Administered 2022-01-11: 2 via ORAL
  Filled 2022-01-11: qty 2

## 2022-01-11 MED ORDER — COLCHICINE 0.6 MG PO TABS: 0.6000 mg | ORAL_TABLET | Freq: Every day | ORAL | 0 refills | Status: DC

## 2022-01-11 NOTE — Discharge Instructions (Signed)
Take the entire prednisone taper as we have discussed, take the colchicine as prescribed, you can use the breakthrough pain medication for severe pain.  Please discontinue drinking alcohol and follow-up with your [facility] clinic.

## 2022-01-11 NOTE — ED Provider Notes (Signed)
[place] [facility] DEPT Provider Note   CSN: [id number] Arrival date & time: 01/11/22  1207     History  Chief Complaint  Patient presents with   Gout    Francis Cardenas is a 71 y.o. male with a past medical history significant for diabetes, hypertension, high cholesterol, COPD, known gout, heart failure who presents with concern for possible gout flareup.  He reports that he is having pain, swelling to bilateral ankles, right knee, reports that he has not had any fall or other injury.  He reports that he does not have anyone to help take care of him at home, and he cannot walk with his current gout flare.  Patient reports that he has had to be hospitalized, and received physical therapy in the past due to acute flares.  He has been also seen and discharged in the past with colchicine, prednisone, and other pain control.  Today he reports that he does not think he can go home because of his pain.  HPI     Home Medications Prior to Admission medications   Medication Sig Start Date End Date Taking? Authorizing Provider  colchicine 0.6 MG tablet Take 1 tablet (0.6 mg total) by mouth daily. You can take 2 at a time for sign of flare, and then 1 in the next hour, followed by 1 daily until you have finished the medication. 01/11/22  Yes Lucciano Vitali H, PA-C  oxyCODONE-acetaminophen (PERCOCET/ROXICET) 5-325 MG tablet Take 1 tablet by mouth every 6 (six) hours as needed for severe pain. 01/11/22  Yes Zarra Geffert H, PA-C  predniSONE (STERAPRED UNI-PAK 21 TAB) 10 MG (21) TBPK tablet Take by mouth daily. Take 6 tabs by mouth daily  for 2 days, then 5 tabs for 2 days, then 4 tabs for 2 days, then 3 tabs for 2 days, 2 tabs for 2 days, then 1 tab by mouth daily for 2 days 01/11/22  Yes Romina Divirgilio H, PA-C  acetaminophen (TYLENOL) 325 MG tablet Take 2 tablets (650 mg total) by mouth every 6 (six) hours as needed. 09/27/21   Jeanell Sparrow, DO  albuterol (PROVENTIL  HFA;VENTOLIN HFA) 108 (90 BASE) MCG/ACT inhaler Inhale 2 puffs into the lungs every 6 (six) hours as needed for wheezing.    [provider]  allopurinol (ZYLOPRIM) 300 MG tablet Take 1 tablet by mouth daily. 10/12/21   [provider]  ALPRAZolam (XANAX XR) 1 MG 24 hr tablet Take 1 mg by mouth at bedtime.    [provider]  amLODipine (NORVASC) 10 MG tablet TAKE ONE TABLET BY MOUTH DAILY HOLD DOSE FOR SYSTOLIC BLOOD PRESSURE BELOW 100; AVOID GRAPEFRUIT PRODUCTS 10/21/21   [provider]  apixaban (ELIQUIS) 5 MG TABS tablet Take 1 tablet (5 mg total) by mouth 2 (two) times daily. 05/20/21   Terrilee Croak, MD  atorvastatin (LIPITOR) 80 MG tablet Take 40 mg by mouth daily with supper.    [provider]  Carboxymethylcellulose Sodium 0.25 % SOLN Place 1 drop into both eyes 3 (three) times daily as needed (dry eyes/ irritation).    [provider]  Cholecalciferol (VITAMIN D3 PO) Take 1 tablet by mouth daily with supper.    [provider]  diclofenac Sodium (VOLTAREN) 1 % GEL Apply 2 g topically 4 (four) times daily. 05/20/21   Terrilee Croak, MD  Ensure Max Protein (ENSURE MAX PROTEIN) LIQD Take 330 mLs (11 oz total) by mouth 2 (two) times daily. 12/12/18  [name], Nishant, MD  gabapentin (NEURONTIN) 300 MG capsule Take 300 mg by mouth 3 (three) times daily.    [provider]  guaifenesin (HUMIBID E) 400 MG TABS tablet Take 400 mg by mouth every morning.    [provider]  hydrALAZINE (APRESOLINE) 25 MG tablet TAKE ONE TABLET BY MOUTH DAILY FOR HYPERTENSION - HOLD DOSE FOR SYSTOLIC BLOOD PRESSURE LESS THAN 100 10/21/21   [provider]  Magnesium Oxide 420 MG TABS Take 420 mg by mouth daily with supper.    [provider]  metoprolol succinate (TOPROL-XL) 25 MG 24 hr tablet Take 0.5 tablets (12.5 mg total) by mouth daily. 09/04/21   Cherene Altes, MD  mometasone-formoterol (DULERA) 100-5 MCG/ACT AERO  Inhale 2 puffs into the lungs 2 (two) times daily. 05/20/21   Terrilee Croak, MD  Multiple Vitamins-Minerals (MULTIVITAMIN WITH MINERALS) tablet Take 1 tablet by mouth daily with lunch.     [provider]  omeprazole (PRILOSEC) 20 MG capsule Take 20 mg by mouth daily with supper.    [provider]  potassium chloride SA (KLOR-CON M) 20 MEQ tablet Take 1 tablet (20 mEq total) by mouth daily with supper. 05/20/21   Terrilee Croak, MD  spironolactone (ALDACTONE) 25 MG tablet Take 25 mg by mouth every morning.    [provider]  thiamine 100 MG tablet Take 1 tablet (100 mg total) by mouth daily. Patient taking differently: Take 100 mg by mouth daily. Vitamin B1 06/05/20   [name], MD  torsemide (DEMADEX) 20 MG tablet TAKE ONE TABLET BY MOUTH TWICE A DAY - HOLD DOSE FOR SYSTOLIC BLOOD PRESSURE LESS THAN 100 10/21/21   [provider]  vitamin B-12 (CYANOCOBALAMIN) 500 MCG tablet Take 500 mcg by mouth daily with supper.    [provider]      Allergies    Oxcarbazepine and Zolpidem    Review of Systems   Review of Systems  Musculoskeletal:  Positive for arthralgias.  All other systems reviewed and are negative.   Physical Exam Updated Vital Signs BP 136/74 (BP Location: Left Arm)   Pulse 81   Temp 98.3 F (36.8 C) (Oral)   Resp 18   Ht '5\' 10"'$  (1.778 m)   Wt 128 kg   SpO2 96%   BMI 40.49 kg/m  Physical Exam Vitals and nursing note reviewed.  Constitutional:      General: He is not in acute distress.    Appearance: Normal appearance.  HENT:     Head: Normocephalic and atraumatic.  Eyes:     General:        Right eye: No discharge.        Left eye: No discharge.  Cardiovascular:     Rate and Rhythm: Normal rate and regular rhythm.     Pulses: Normal pulses.  Pulmonary:     Effort: Pulmonary effort is normal. No respiratory distress.  Musculoskeletal:        General: No deformity.     Comments: Patient has decreased strength to  flexion, extension of the right hip, secondary to pain.  He has no midline spinal tenderness.  He does have some tenderness right greater than the left knee with some swelling, right ankle is swollen and red.  He also has some redness of the wrists.  He has intact range of motion in all joints, low clinical concern for septic arthritis.  Skin:    General: Skin is warm and dry.  Capillary Refill: Capillary refill takes less than 2 seconds.  Neurological:     Mental Status: He is alert and oriented to person, place, and time.  Psychiatric:        Mood and Affect: Mood normal.        Behavior: Behavior normal.     ED Results / Procedures / Treatments   Labs (all labs ordered are listed, but only abnormal results are displayed) Labs Reviewed  CBC - Abnormal; Notable for the following components:      Result Value   WBC 12.7 (*)    RDW 16.7 (*)    nRBC 0.6 (*)    All other components within normal limits  BASIC METABOLIC PANEL - Abnormal; Notable for the following components:   Potassium 3.3 (*)    Glucose, Bld 147 (*)    Calcium 11.3 (*)    All other components within normal limits    EKG None  Radiology DG Hip Unilat W or Wo Pelvis 2-3 Views Right  Result Date: 01/11/2022 CLINICAL DATA:  Right hip pain EXAM: DG HIP (WITH OR WITHOUT PELVIS) 2-3V RIGHT COMPARISON:  None Available. FINDINGS: Exam is mildly limited by overlying soft tissue. No definite fracture. Small osseous fragment about the right greater trochanter is likely chronic/degenerative. No dislocation. No acute soft tissue abnormality. IMPRESSION: No acute fracture or dislocation. Electronically Signed   By: Placido Sou M.D.   On: 01/11/2022 19:41   DG Wrist Complete Right  Result Date: 01/11/2022 CLINICAL DATA:  w a 71 year old male presents for evaluation of concern for gout flare with pain and swelling. EXAM: RIGHT WRIST - COMPLETE 3+ VIEW COMPARISON:  Sep 27, 2021 FINDINGS: Periarticular erosion with  overhanging edges on the lateral, radial margin of the first metacarpal along the distal aspect near the joint appears better defined. There is diffuse osteopenia. Degenerative changes about the wrist are similar to prior imaging. Suspect erosion at the base of the fifth metacarpal along the ulnar aspect as well which is also the likely better defined. No sign of acute fracture or dislocation. No substantial increase in soft tissue swelling about these areas compared to previous imaging. IMPRESSION: Erosive changes about the first and fifth metacarpals could be seen in the setting of gout or inflammatory arthropathy and appear slightly better defined/increased since previous imaging. No additional findings to suggest acute process. Background degenerative changes are similar to prior imaging. Electronically Signed   By: Zetta Bills M.D.   On: 01/11/2022 13:44   DG Ankle Complete Right  Result Date: 01/11/2022 CLINICAL DATA:  Right ankle pain without known injury. EXAM: RIGHT ANKLE - COMPLETE 3+ VIEW COMPARISON:  None Available. FINDINGS: There is no evidence of fracture, dislocation, or joint effusion. Mild osteophyte formation is seen involving the talotibial joint. Soft tissues are unremarkable. 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 No acute abnormality seen. Electronically Signed   By: Marijo Conception M.D.   On: 01/11/2022 13:44   DG Knee Complete 4 Views Right  Result Date: 01/11/2022 CLINICAL DATA:  Right knee pain without known injury. EXAM: RIGHT KNEE - COMPLETE 4+ VIEW COMPARISON:  None Available. FINDINGS: Small suprapatellar joint effusion is noted. No fracture or dislocation is noted. Moderate narrowing of medial joint space is noted. IMPRESSION: Moderate degenerative joint disease is noted medially. Small suprapatellar joint effusion. No fracture or dislocation. Electronically Signed   By: Marijo Conception M.D.   On: 01/11/2022 13:43    Procedures Procedures  Medications Ordered in ED Medications  oxyCODONE-acetaminophen (PERCOCET/ROXICET) 5-325 MG per tablet 2 tablet (2 tablets Oral Given 01/11/22 1702)  predniSONE (DELTASONE) tablet 60 mg (60 mg Oral Given 01/11/22 1825)  colchicine tablet 0.6 mg (0.6 mg Oral Given 01/11/22 1825)  potassium chloride SA (KLOR-CON M) CR tablet 20 mEq (20 mEq Oral Given 01/11/22 1854)    ED Course/ Medical Decision Making/ A&P                           Medical Decision Making Amount and/or Complexity of Data Reviewed Labs: ordered. Radiology: ordered.  Risk Prescription drug management.   This patient is a 71 y.o. male who presents to the ED for concern of gout flare, pain in bilateral wrists, right greater than left, right knee, and ankles right greater than left, this involves an extensive number of treatment options, and is a complaint that carries with it a high risk of complications and morbidity. The emergent differential diagnosis prior to evaluation includes, but is not limited to, arthritis, new fracture, dislocation, versus gout flare, pseudogout, septic arthritis, versus other.   This is not an exhaustive differential.   Past Medical History / Co-morbidities / Social History: diabetes, hypertension, high cholesterol, COPD, known gout, heart failure  Additional history: Chart reviewed. Pertinent results include: Reviewed lab work, imaging from previous emergency department evaluations, patient has been treated for gout of multiple sites before  Physical Exam: Physical exam performed. The pertinent findings include: Patient does have some redness, tenderness most prominent of right ankle, right knee, and wrists, right ankle is most prominent.  He does have intact range of motion of the affected joints, low clinical concern for septic arthritis.  He does have some pain with weightbearing.  Additionally he endorses some pain of the right hip, but does not have significant joint swelling, tenderness,  he reports difficulty with ambulation, and has slightly decreased strength likely secondary to pain.  Lab Tests: I ordered, and personally interpreted labs.  The pertinent results include: CBC is minimally elevated, white blood cells at 12.7, likely acute reactive secondary to pain.  His BMP is notable for mild hypokalemia, potassium 3.3 we will orally replete.  His glucose is minimally elevated at 147, his calcium is chronically elevated, similar to baseline at 11.3.   Imaging Studies: I ordered imaging studies including plain film radiographs of the right hip, right wrist, right ankle, and right knee. I independently visualized and interpreted imaging which showed degenerative changes that are consistent with gout, no overt evidence of fracture, dislocation.  I agree with the radiologist interpretation.   Medications: I ordered medication including potassium for hypokalemia, colchicine, prednisone, and Percocet for gouty flare. Reevaluation of the patient after these medicines showed that the patient with some improvement of pain, and he is willing to attempt ambulation after medications provided above. I have reviewed the patients home medicines and have made adjustments as needed   Disposition: After consideration of the diagnostic results and the patients response to treatment, I feel that patient's symptoms are consistent with gout flare, he initially was not willing to attempt ambulation, however he feels improved after pain medication.   emergency department workup does not suggest an emergent condition requiring admission or immediate intervention beyond what has been performed at this time. The plan is: Discharge with prednisone, colchicine, Percocet. The patient is safe for discharge and has been instructed to return immediately for worsening symptoms, change in symptoms or any  other concerns.  I discussed this case with my attending physician Dr. Maryan Rued who cosigned this note including  patient's presenting symptoms, physical exam, and planned diagnostics and interventions. Attending physician stated agreement with plan or made changes to plan which were implemented.    Final Clinical Impression(s) / ED Diagnoses Final diagnoses:  Gouty arthritis    Rx / DC Orders ED Discharge Orders          Ordered    predniSONE (STERAPRED UNI-PAK 21 TAB) 10 MG (21) TBPK tablet  Daily        01/11/22 2000    colchicine 0.6 MG tablet  Daily        01/11/22 2000    oxyCODONE-acetaminophen (PERCOCET/ROXICET) 5-325 MG tablet  Every 6 hours PRN        01/11/22 2000              Aloha Bartok, Jackalyn Lombard 01/11/22 2044    Blanchie Dessert, MD 01/14/22 1622

## 2022-01-11 NOTE — ED Provider Triage Note (Signed)
Emergency Medicine Provider Triage Evaluation Note  Francis Cardenas , a 71 y.o. male  was evaluated in triage.  Pt complains of gout flare in bilateral ankles and right wrist. Patient reports he has allopurinol but also drinks alcohol and can't quit. Doesn't have prescription of colchicine.  Review of Systems  Positive: Right wrist pain, bilateral ankle pain Negative: Fall, or other injuries  Physical Exam  BP (!) 157/81 (BP Location: Left Arm)   Pulse 96   Temp 99.4 F (37.4 C) (Oral)   Resp 18   SpO2 92%  Gen:   Awake, no distress   Resp:  Normal effort  MSK:   Moves extremities without difficulty  Other:  Patient with some redness, tenderness, of right wrist, bilateral ankles  Medical Decision Making  Medically screening exam initiated at 12:49 PM.  Appropriate orders placed.  Lewayne Bunting was informed that the remainder of the evaluation will be completed by another provider, this initial triage assessment does not replace that evaluation, and the importance of remaining in the ED until their evaluation is complete.  Workup initiated   Anselmo Pickler, [place] 01/11/22 1251

## 2022-01-11 NOTE — ED Triage Notes (Signed)
Pt reports he believes he is having a gout flair up. C/o pain and swelling to bilateral ankles and right knee. Denies any injury or other associated symptoms.

## 2022-02-02 ENCOUNTER — Encounter (HOSPITAL_BASED_OUTPATIENT_CLINIC_OR_DEPARTMENT_OTHER): Payer: Self-pay | Admitting: Cardiology

## 2022-02-02 ENCOUNTER — Ambulatory Visit (HOSPITAL_BASED_OUTPATIENT_CLINIC_OR_DEPARTMENT_OTHER): Payer: Medicare HMO | Admitting: Cardiology

## 2022-02-02 VITALS — BP 114/68 | HR 70 | Ht 70.0 in | Wt 273.6 lb

## 2022-02-02 DIAGNOSIS — I5032 Chronic diastolic (congestive) heart failure: Secondary | ICD-10-CM | POA: Diagnosis not present

## 2022-02-02 DIAGNOSIS — G4733 Obstructive sleep apnea (adult) (pediatric): Secondary | ICD-10-CM

## 2022-02-02 DIAGNOSIS — I10 Essential (primary) hypertension: Secondary | ICD-10-CM | POA: Diagnosis not present

## 2022-02-02 DIAGNOSIS — F1010 Alcohol abuse, uncomplicated: Secondary | ICD-10-CM | POA: Diagnosis not present

## 2022-02-02 DIAGNOSIS — I4811 Longstanding persistent atrial fibrillation: Secondary | ICD-10-CM | POA: Diagnosis not present

## 2022-02-02 NOTE — Patient Instructions (Signed)
Medication Instructions:  Your Physician recommend you continue on your current medication as directed.    *If you need a refill on your cardiac medications before your next appointment, please call your pharmacy*   Lab Work: None ordered today   Testing/Procedures: None ordered today   Follow-Up: At [facility], you and your health needs are our priority.  As part of our continuing mission to provide you with exceptional heart care, we have created designated Provider Care Teams.  These Care Teams include your primary Cardiologist (physician) and Advanced Practice Providers (APPs -  Physician Assistants and Nurse Practitioners) who all work together to provide you with the care you need, when you need it.  We recommend signing up for the patient portal called "MyChart".  Sign up information is provided on this After Visit Summary.  MyChart is used to connect with patients for Virtual Visits (Telemedicine).  Patients are able to view lab/test results, encounter notes, upcoming appointments, etc.  Non-urgent messages can be sent to your provider as well.   To learn more about what you can do with MyChart, go to [email or web address].    Your next appointment:   As needed  The format for your next appointment:   In Person  Provider:   Bridgette Christopher, MD           

## 2022-02-02 NOTE — Progress Notes (Signed)
Cardiology Office Note:    Date:  02/02/2022   ID:  Francis Cardenas, DOB 1951/04/22, MRN [id number]  PCP:  Gerome Sam, MD  Cardiologist:  Buford Dresser, MD  Referring MD: Gerome Sam*   CC: follow up  History of Present Illness:    Francis Cardenas is a 71 y.o. male with a hx of hypertension, COPD, chronic diastolic heart failure, atrial fibrillation, pulmonary hypertension who is seen for follow up. He was previously seen by Dr. Radford Pax, Dr. Acie Fredrickson, and Dr. Harrington Challenger. His last admission was 08/2021.  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 His last gout flare had been three weeks prior, believed to be a result of drinking beer again. He was given pain control and medications. His renal function was normal and he was discharged with prednisone and colchicine.   At his last appointment, his main concern was gout. He is a disabled veteran and is followed by a PCP at the [place]. He did not feel that his gout was well controlled and asked for options for stronger medications to manage the pain.  He had noticed over the last two days that his feet are swelling. Weight went up as well. Typically about 278 lbs at home, that morning he weighed 283 lbs. He was no longer on furosemide but on torsemide instead, unclear of the dose but was taking twice a day. Was making good urine on this. Had takeout over the weekend, had noticed swelling since then. Would try to elevate feet while sitting. He knew that shellfish would worsen his gout but had some over the weekend.  Also would drink 8-9 beers/day, has since he was 71 years old. No interest in cutting back. Discussed how this affects his heart.   Today:  He says he has not been well. He states that he almost called to cancelled because he is feeling his gout right now and cannot walk well.   He detailed an episode where he needed to get up to urinate during the night  and could not get up to do so. This is what led to his ED visit.  He believes his breathing has worsened. At times, he will have a productive cough as well, which generally occurs after he eats a meal.  He has been working on wearing compression socks to assist with his bilateral LE edema due to gout. In clinic, his swelling appears to be better than it has been in the past.  He has lost about 10 lbs since his last visit.  For his diet, he is eating a lot more fruits and vegetables. He does not eat much red meat or steak anymore.  He continues to drink heavily and is not interested in cutting back.  He denies any palpitations, chest pain, or shortness of breath. No lightheadedness, headaches, syncope, orthopnea, or PND.   Past Medical History:  Diagnosis Date   BPH (benign prostatic hyperplasia)    Cancer (HCC)    prostate   COPD (chronic obstructive pulmonary disease) (HCC)    Diabetes mellitus without complication ([place])    Hypercholesteremia    Hypertension     Past Surgical History:  Procedure Laterality Date   ACHILLES TENDON REPAIR Left    KNEE ARTHROSCOPY Left    RIGHT/LEFT HEART CATH AND CORONARY ANGIOGRAPHY N/A 04/09/2019   Procedure: RIGHT/LEFT HEART CATH AND CORONARY ANGIOGRAPHY;  Surgeon: Martinique, Peter M, MD;  Location: [facility] INVASIVE CV  LAB;  Service: Cardiovascular;  Laterality: N/A;   TONSILLECTOMY     WRIST FRACTURE SURGERY Left     Current Medications: Current Outpatient Medications on File Prior to Visit  Medication Sig   acetaminophen (TYLENOL) 325 MG tablet Take 2 tablets (650 mg total) by mouth every 6 (six) hours as needed.   albuterol (PROVENTIL HFA;VENTOLIN HFA) 108 (90 BASE) MCG/ACT inhaler Inhale 2 puffs into the lungs every 6 (six) hours as needed for wheezing.   allopurinol (ZYLOPRIM) 300 MG tablet Take 1 tablet by mouth daily.   ALPRAZolam (XANAX XR) 1 MG 24 hr tablet Take 1 mg by mouth at bedtime.   amLODipine (NORVASC) 10 MG tablet TAKE ONE TABLET  BY MOUTH DAILY HOLD DOSE FOR SYSTOLIC BLOOD PRESSURE BELOW 100; AVOID GRAPEFRUIT PRODUCTS   apixaban (ELIQUIS) 5 MG TABS tablet Take 1 tablet (5 mg total) by mouth 2 (two) times daily.   atorvastatin (LIPITOR) 80 MG tablet Take 40 mg by mouth daily with supper.   Carboxymethylcellulose Sodium 0.25 % SOLN Place 1 drop into both eyes 3 (three) times daily as needed (dry eyes/ irritation).   Cholecalciferol (VITAMIN D3 PO) Take 1 tablet by mouth daily with supper.   colchicine 0.6 MG tablet Take 1 tablet (0.6 mg total) by mouth daily. You can take 2 at a time for sign of flare, and then 1 in the next hour, followed by 1 daily until you have finished the medication.   diclofenac Sodium (VOLTAREN) 1 % GEL Apply 2 g topically 4 (four) times daily.   Ensure Max Protein (ENSURE MAX PROTEIN) LIQD Take 330 mLs (11 oz total) by mouth 2 (two) times daily.   gabapentin (NEURONTIN) 300 MG capsule Take 300 mg by mouth 3 (three) times daily.   guaifenesin (HUMIBID E) 400 MG TABS tablet Take 400 mg by mouth every morning.   hydrALAZINE (APRESOLINE) 25 MG tablet TAKE ONE TABLET BY MOUTH DAILY FOR HYPERTENSION - HOLD DOSE FOR SYSTOLIC BLOOD PRESSURE LESS THAN 100   Magnesium Oxide 420 MG TABS Take 420 mg by mouth daily with supper.   metoprolol succinate (TOPROL-XL) 25 MG 24 hr tablet Take 0.5 tablets (12.5 mg total) by mouth daily.   mometasone-formoterol (DULERA) 100-5 MCG/ACT AERO Inhale 2 puffs into the lungs 2 (two) times daily.   Multiple Vitamins-Minerals (MULTIVITAMIN WITH MINERALS) tablet Take 1 tablet by mouth daily with lunch.    omeprazole (PRILOSEC) 20 MG capsule Take 20 mg by mouth daily with supper.   oxyCODONE-acetaminophen (PERCOCET/ROXICET) 5-325 MG tablet Take 1 tablet by mouth every 6 (six) hours as needed for severe pain.   potassium chloride SA (KLOR-CON M) 20 MEQ tablet Take 1 tablet (20 mEq total) by mouth daily with supper.   predniSONE (STERAPRED UNI-PAK 21 TAB) 10 MG (21) TBPK tablet Take  by mouth daily. Take 6 tabs by mouth daily  for 2 days, then 5 tabs for 2 days, then 4 tabs for 2 days, then 3 tabs for 2 days, 2 tabs for 2 days, then 1 tab by mouth daily for 2 days   spironolactone (ALDACTONE) 25 MG tablet Take 25 mg by mouth every morning.   thiamine 100 MG tablet Take 1 tablet (100 mg total) by mouth daily. (Patient taking differently: Take 100 mg by mouth daily. Vitamin B1)   torsemide (DEMADEX) 20 MG tablet TAKE ONE TABLET BY MOUTH TWICE A DAY - HOLD DOSE FOR SYSTOLIC BLOOD PRESSURE LESS THAN 100   vitamin B-12 (CYANOCOBALAMIN) 500 MCG  tablet Take 500 mcg by mouth daily with supper.   No current facility-administered medications on file prior to visit.     Allergies:   Oxcarbazepine and Zolpidem   Social History   Tobacco Use   Smoking status: Former    Passive exposure: Never   Smokeless tobacco: Never  Vaping Use   Vaping Use: Never used  Substance Use Topics   Alcohol use: Yes    Alcohol/week: 8.0 standard drinks of alcohol    Types: 8 Cans of beer per week    Comment: 8 cans beer/day   Drug use: No    Family History: family history includes CAD in his mother; Heart attack in his brother and father; Leukemia in his mother.  ROS:   Please see the history of present illness.   (+) Mild bilateral LE edema  Additional pertinent ROS otherwise unremarkable.  EKGs/Labs/Other Studies Reviewed:    The following studies were reviewed today:  Echo 08/31/2021:    1. Left ventricular ejection fraction, by estimation, is 60 to 65%. The  left ventricle has normal function. The left ventricle has no regional  wall motion abnormalities. Left ventricular diastolic parameters are  indeterminate.   2. Right ventricular systolic function is normal. The right ventricular  size is mildly enlarged. There is mildly elevated pulmonary artery  systolic pressure.   3. Left atrial size was mildly dilated.   4. Right atrial size was severely dilated.   5. The mitral  valve is normal in structure. No evidence of mitral valve  regurgitation. No evidence of mitral stenosis.   6. The aortic valve is normal in structure. Aortic valve regurgitation is  not visualized. No aortic stenosis is present.   7. The inferior vena cava is dilated in size with <50% respiratory  variability, suggesting right atrial pressure of 15 mmHg.   Right/Left Heart Cath 19/14/7829 LV end diastolic pressure is moderately elevated. The left ventricular systolic function is normal. The left ventricular ejection fraction is 55-65% by visual estimate. There is trivial (1+) mitral regurgitation. There is no aortic valve stenosis. Hemodynamic findings consistent with moderate pulmonary hypertension.   1. Normal coronary anatomy 2. Normal LV systolic function 3. Moderately elevated LV filling pressures. Large V waves to 58 mm Hg c/w LV restriction. No significant MR noted. 4. No AV gradient. 5. Moderate pulmonary HTN 6. Preserved cardiac output. Index 3.     EKG:  EKG is personally reviewed.  02/02/22: atrial fibrillation at 70 bpm 11/01/21: not ordered today 08/31/21: afib at 60 bpm  Recent Labs: 05/15/2021: TSH 2.353 08/30/2021: ALT 10; B Natriuretic Peptide 529.7 09/02/2021: Magnesium 2.4 01/11/2022: BUN 18; Creatinine, Ser 1.22; Hemoglobin 15.2; Platelets 328; Potassium 3.3; Sodium 139  Recent Lipid Panel    Component Value Date/Time   CHOL 125 04/10/2019 0541   TRIG 103 04/10/2019 0541   HDL 41 04/10/2019 0541   CHOLHDL 3.0 04/10/2019 0541   VLDL 21 04/10/2019 0541   LDLCALC 63 04/10/2019 0541    Physical Exam:    VS:  BP 114/68 (BP Location: Right Arm, Patient Position: Sitting, Cuff Size: Large)   Pulse 70   Ht '5\' 10"'$  (1.778 m)   Wt 273 lb 9.6 oz (124.1 kg)   BMI 39.26 kg/m     Wt Readings from Last 3 Encounters:  02/02/22 273 lb 9.6 oz (124.1 kg)  01/11/22 282 lb 3 oz (128 kg)  11/09/21 282 lb 3.2 oz (128 kg)    GEN: Well nourished, well  developed in no  acute distress HEENT: Normal, moist mucous membranes NECK: No JVD sitting upright CARDIAC: irregularly irregular rhythm, normal S1 and S2, no rubs or gallops. No murmur. VASCULAR: Radial and DP pulses 2+ bilaterally. No carotid bruits RESPIRATORY:  Clear to auscultation without rales, wheezing or rhonchi  ABDOMEN: Soft, non-tender, non-distended MUSCULOSKELETAL: moves all 4 limbs independently. SKIN: Warm and dry, bilateral trace to 1+ LE edema NEUROLOGIC:  Alert and oriented x 3. No focal neuro deficits noted. PSYCHIATRIC:  Normal affect    ASSESSMENT:    1. Chronic diastolic heart failure ([place])   2. Essential hypertension   3. Longstanding persistent atrial fibrillation ([place])   4. ETOH abuse   5. OSA (obstructive sleep apnea)      PLAN:    Chronic diastolic heart failure -on torsemide BID -Discussed diet, salt avoidance, monitoring weights -drinks 8-9 beers/day. Advised against this -continue spironolactone -reported history of diabetes, but last A1c 5.5 and on no meds -would consider SGLT2i, would need to get approved through the [place] -heart failure education done today  Atrial fibrillation CHA2DS2/VAS Stroke Risk Points=4 -continue apixaban -continue metoprolol succinate 12.5 mg daily. Avoid bradycardia with diastolic heart failure -he states that he has been in afib since diagnosis. Unclear what has been tried before--Dr. Edd Arbour at the [facility] recommend cardioversion but he was declined (issues with transportation, alcohol use)  Hypertension -at goal today -continue torsemide, spironolactone  OSA on CPAP: continue CPAP  Alcohol abuse: 8-9 beers/day, counseled on limiting or stopping use with heart failure and afib. He is not interested.  Cardiac risk counseling and prevention recommendations: prior cath without significant CAD -recommend heart healthy/Mediterranean diet, with whole grains, fruits, vegetable, fish, lean meats, nuts, and olive oil. Limit salt. -recommend  moderate walking, 3-5 times/week for 30-50 minutes each session. Aim for at least 150 minutes.week. Goal should be pace of 3 miles/hours, or walking 1.5 miles in 30 minutes -recommend avoidance of tobacco products. Avoid excess alcohol. -ASCVD risk score: The ASCVD Risk score (Arnett DK, et al., 2019) failed to calculate for the following reasons:   The valid total cholesterol range is 130 to 320 mg/dL    Plan for follow up: He is followed closely at the [place]. I would be happy to see him back as needed  Buford Dresser, MD, PhD, [facility]    Medication Adjustments/Labs and Tests Ordered: Current medicines are reviewed at length with the patient today.  Concerns regarding medicines are outlined above.  Orders Placed This Encounter  Procedures   EKG 12-Lead   No orders of the defined types were placed in this encounter.   Patient Instructions  Medication Instructions:  Your Physician recommend you continue on your current medication as directed.    *If you need a refill on your cardiac medications before your next appointment, please call your pharmacy*   Lab Work: None ordered today   Testing/Procedures: None ordered today   Follow-Up: At [facility], you and your health needs are our priority.  As part of our continuing mission to provide you with exceptional heart care, we have created designated Provider Care Teams.  These Care Teams include your primary Cardiologist (physician) and Advanced Practice Providers (APPs -  Physician Assistants and Nurse Practitioners) who all work together to provide you with the care you need, when you need it.  We recommend signing up for the patient portal called "MyChart".  Sign up information is provided on this After Visit Summary.  MyChart is used to connect with patients for Virtual Visits (Telemedicine).  Patients are able to view lab/test results, encounter notes, upcoming appointments, etc.   Non-urgent messages can be sent to your provider as well.   To learn more about what you can do with MyChart, go to [email or web address].    Your next appointment:   As needed  The format for your next appointment:   In Person  Provider:   Buford Dresser, MD            I,Breanna Adamick,acting as a scribe for Buford Dresser, MD.,have documented all relevant documentation on the behalf of Buford Dresser, MD,as directed by  Buford Dresser, MD while in the presence of Buford Dresser, MD.   I, Buford Dresser, MD, have reviewed all documentation for this visit. The documentation on 02/21/22 for the exam, diagnosis, procedures, and orders are all accurate and complete.   Signed, Buford Dresser, MD PhD 02/02/2022     [place]

## 2022-02-28 ENCOUNTER — Telehealth: Payer: Self-pay | Admitting: Cardiology

## 2022-02-28 DIAGNOSIS — I4819 Other persistent atrial fibrillation: Secondary | ICD-10-CM

## 2022-02-28 MED ORDER — APIXABAN 5 MG PO TABS: 5.0000 mg | ORAL_TABLET | Freq: Two times a day (BID) | ORAL | 1 refills | Status: AC

## 2022-02-28 NOTE — Telephone Encounter (Signed)
*  STAT* If patient is at the pharmacy, call can be transferred to refill team.   1. Which medications need to be refilled? (please list name of each medication and dose if known)  apixaban (ELIQUIS) 5 MG TABS tablet  2. Which pharmacy/location (including street and city if local pharmacy) is medication to be sent to? [id number] - [place], [place] - [address]  3. Do they need a 30 day or 90 day supply?  90 day supply, patient states he has been completely out of medication since yesterday, [facility]08 [facility], but patient states they are closed today

## 2022-02-28 NOTE — Telephone Encounter (Signed)
Prescription refill request for Eliquis received. Indication:Afib  Last office visit: 02/02/22 Harrell Gave)  Scr: 1.22 (01/11/22) Age: 71 Weight: 124.1kg  Appropriate dose and refill sent to requested pharmacy.

## 2022-04-05 ENCOUNTER — Encounter (HOSPITAL_BASED_OUTPATIENT_CLINIC_OR_DEPARTMENT_OTHER): Payer: Self-pay | Admitting: Emergency Medicine

## 2022-04-05 ENCOUNTER — Emergency Department (HOSPITAL_BASED_OUTPATIENT_CLINIC_OR_DEPARTMENT_OTHER): Payer: No Typology Code available for payment source | Admitting: Radiology

## 2022-04-05 ENCOUNTER — Emergency Department (HOSPITAL_BASED_OUTPATIENT_CLINIC_OR_DEPARTMENT_OTHER)
Admission: EM | Admit: 2022-04-05 | Discharge: 2022-04-05 | Disposition: A | Discharge status: Home / Self Care | Payer: No Typology Code available for payment source | Attending: Emergency Medicine | Admitting: Emergency Medicine

## 2022-04-05 ENCOUNTER — Other Ambulatory Visit: Payer: Self-pay

## 2022-04-05 DIAGNOSIS — Z79899 Other long term (current) drug therapy: Secondary | ICD-10-CM | POA: Diagnosis not present

## 2022-04-05 DIAGNOSIS — E119 Type 2 diabetes mellitus without complications: Secondary | ICD-10-CM | POA: Diagnosis not present

## 2022-04-05 DIAGNOSIS — J440 Chronic obstructive pulmonary disease with acute lower respiratory infection: Secondary | ICD-10-CM | POA: Diagnosis not present

## 2022-04-05 DIAGNOSIS — Z87891 Personal history of nicotine dependence: Secondary | ICD-10-CM | POA: Diagnosis not present

## 2022-04-05 DIAGNOSIS — Z1152 Encounter for screening for COVID-19: Secondary | ICD-10-CM | POA: Diagnosis not present

## 2022-04-05 DIAGNOSIS — I5030 Unspecified diastolic (congestive) heart failure: Secondary | ICD-10-CM | POA: Insufficient documentation

## 2022-04-05 DIAGNOSIS — Z7901 Long term (current) use of anticoagulants: Secondary | ICD-10-CM | POA: Insufficient documentation

## 2022-04-05 DIAGNOSIS — Z8546 Personal history of malignant neoplasm of prostate: Secondary | ICD-10-CM | POA: Diagnosis not present

## 2022-04-05 DIAGNOSIS — I110 Hypertensive heart disease with heart failure: Secondary | ICD-10-CM | POA: Insufficient documentation

## 2022-04-05 DIAGNOSIS — R0602 Shortness of breath: Secondary | ICD-10-CM | POA: Diagnosis present

## 2022-04-05 DIAGNOSIS — Z7951 Long term (current) use of inhaled steroids: Secondary | ICD-10-CM | POA: Insufficient documentation

## 2022-04-05 LAB — CBC
HCT: 40.3 % (ref 39.0–52.0)
Hemoglobin: 13.2 g/dL (ref 13.0–17.0)
MCH: 30.7 pg (ref 26.0–34.0)
MCHC: 32.8 g/dL (ref 30.0–36.0)
MCV: 93.7 fL (ref 80.0–100.0)
Platelets: 195 10*3/uL (ref 150–400)
RBC: 4.3 MIL/uL (ref 4.22–5.81)
RDW: 15.4 % (ref 11.5–15.5)
WBC: 11.2 10*3/uL — ABNORMAL HIGH (ref 4.0–10.5)
nRBC: 0 % (ref 0.0–0.2)

## 2022-04-05 LAB — BASIC METABOLIC PANEL
Anion gap: 12 (ref 5–15)
BUN: 18 mg/dL (ref 8–23)
CO2: 20 mmol/L — ABNORMAL LOW (ref 22–32)
Calcium: 10.2 mg/dL (ref 8.9–10.3)
Chloride: 99 mmol/L (ref 98–111)
Creatinine, Ser: 0.95 mg/dL (ref 0.61–1.24)
GFR, Estimated: >60 mL/min (ref >60)
Glucose, Bld: 105 mg/dL — ABNORMAL HIGH (ref 70–99)
Potassium: 4.4 mmol/L (ref 3.5–5.1)
Sodium: 131 mmol/L — ABNORMAL LOW (ref 135–145)

## 2022-04-05 LAB — I-STAT VENOUS BLOOD GAS, ED
Acid-base deficit: 2 mmol/L (ref 0.0–2.0)
Bicarbonate: 23.8 mmol/L (ref 20.0–28.0)
Calcium, Ion: 1.37 mmol/L (ref 1.15–1.40)
HCT: 48 % (ref 39.0–52.0)
Hemoglobin: 16.3 g/dL (ref 13.0–17.0)
O2 Saturation: 69 %
Patient temperature: 98.6
Potassium: 3.5 mmol/L (ref 3.5–5.1)
Sodium: 135 mmol/L (ref 135–145)
TCO2: 25 mmol/L (ref 22–32)
pCO2, Ven: 41.3 mmHg — ABNORMAL LOW (ref 44–60)
pH, Ven: 7.368 (ref 7.25–7.43)
pO2, Ven: 37 mmHg (ref 32–45)

## 2022-04-05 LAB — BRAIN NATRIURETIC PEPTIDE: B Natriuretic Peptide: 157.8 pg/mL — ABNORMAL HIGH (ref 0.0–100.0)

## 2022-04-05 LAB — RESP PANEL BY RT-PCR (FLU A&B, COVID) ARPGX2
Influenza A by PCR: NEGATIVE
Influenza B by PCR: NEGATIVE
SARS Coronavirus 2 by RT PCR: NEGATIVE

## 2022-04-05 MED ORDER — PREDNISONE 50 MG PO TABS
60.0000 mg | ORAL_TABLET | Freq: Once | ORAL | Status: AC
  Administered 2022-04-05: 60 mg via ORAL
  Filled 2022-04-05: qty 1

## 2022-04-05 MED ORDER — AZITHROMYCIN 250 MG PO TABS: 250.0000 mg | ORAL_TABLET | Freq: Every day | ORAL | 0 refills | Status: DC

## 2022-04-05 MED ORDER — AMOXICILLIN-POT CLAVULANATE 875-125 MG PO TABS: 1.0000 | ORAL_TABLET | Freq: Two times a day (BID) | ORAL | 0 refills | Status: DC

## 2022-04-05 MED ORDER — FUROSEMIDE 40 MG PO TABS
40.0000 mg | ORAL_TABLET | Freq: Once | ORAL | Status: AC
  Administered 2022-04-05: 40 mg via ORAL
  Filled 2022-04-05: qty 1

## 2022-04-05 MED ORDER — ALBUTEROL SULFATE HFA 108 (90 BASE) MCG/ACT IN AERS: 2.0000 | INHALATION_SPRAY | RESPIRATORY_TRACT | Status: DC | PRN

## 2022-04-05 MED ORDER — PREDNISONE 50 MG PO TABS: 50.0000 mg | ORAL_TABLET | Freq: Every day | ORAL | 0 refills | Status: AC

## 2022-04-05 MED ORDER — PREDNISONE 50 MG PO TABS: 50.0000 mg | ORAL_TABLET | Freq: Every day | ORAL | 0 refills | Status: DC

## 2022-04-05 MED ORDER — IPRATROPIUM-ALBUTEROL 0.5-2.5 (3) MG/3ML IN SOLN
3.0000 mL | Freq: Once | RESPIRATORY_TRACT | Status: AC
  Administered 2022-04-05: 3 mL via RESPIRATORY_TRACT
  Filled 2022-04-05: qty 3

## 2022-04-05 NOTE — Discharge Instructions (Signed)
We evaluated you for your shortness of breath.  Your symptoms are likely due to a combination of COPD and pneumonia.  I have started you on 2 different antibiotics.  I have also prescribed you steroids for your COPD exacerbation.  Please take the full course of these medications.  Please follow-up closely with your primary doctor.  Please return to the emergency department if you have any worsening symptoms, such as fevers or chills, increased difficulty breathing, fainting, chest pain, worsening cough, or any other concerning symptoms.

## 2022-04-05 NOTE — ED Triage Notes (Signed)
Pt arrived POV. Pt c/o SOB stating he had wheezing while at home with dry cough for approx 2 weeks. Hx of COPD and has been using inhaler as prescribed. Unlabored breathing and clear lung sounds while in triage. Pt also in afib with hx.

## 2022-04-05 NOTE — ED Provider Notes (Signed)
[facility] EMERGENCY DEPT Provider Note  CSN: [id number] Arrival date & time: 04/05/22 1431  Chief Complaint(s) Shortness of Breath  HPI Francis Cardenas is a 71 y.o. male with a history of heart failure with preserved ejection fraction, COPD, diabetes, hypertension presenting to the emergency department with shortness of breath.  He reports mild shortness of breath for 2 weeks.  He reports associated cough, occasionally productive of yellow sputum.  He tried to follow-up with his primary doctor but they advised him to come to the ER.  He reports some chest pain with coughing, otherwise denies chest pain.  No nausea, vomiting, diarrhea, abdominal pain, fevers, chills.  He reports mild fatigue.  Feels similar to prior episodes of COPD exacerbation.  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   PNA (pneumonia) 08/30/2021   Pressure injury of skin 05/15/2021   Prostate cancer ([place]) 05/14/2021   Persistent atrial fibrillation ([place]) 05/14/2021   Elevated troponin 04/06/2019   Community acquired pneumonia    Morbid obesity ([place]) 12/12/2018   (HFpEF) heart failure with preserved ejection fraction ([place]) 12/10/2018   Fall 12/10/2018   Multiple rib fractures 12/10/2018   OSA (obstructive sleep apnea) 12/10/2018   Hypokalemia 12/10/2018   Class 2 severe obesity due to excess calories with serious comorbidity and body mass index (BMI) of 37.0 to 37.9 in adult [facility])    Acute respiratory failure with hypoxia ([place]) 12/30/2017   Chest pain syndrome 12/30/2017   Carcinoma of prostate ([place])  08/02/2016   COPD (chronic obstructive pulmonary disease) ([place]) 08/02/2016   GERD (gastroesophageal reflux disease) 08/02/2016   Mixed hyperlipidemia 08/02/2016   Peripheral neuropathy 08/02/2016   Steatosis of liver 08/02/2016   Diabetes mellitus type 2 in obese ([place]) 08/02/2016   Benign essential hypertension 06/11/2013   Alcohol dependence ([place]) 03/26/2013   PTSD (post-traumatic stress disorder) 03/26/2013   ETOH abuse 03/24/2013   Depressive disorder 03/24/2013   Home Medication(s) Prior to Admission medications   Medication Sig Start Date End Date Taking? Authorizing Provider  amoxicillin-clavulanate (AUGMENTIN) 875-125 MG tablet Take 1 tablet by mouth every 12 (twelve) hours. 04/05/22  Yes Cristie Hem, MD  azithromycin (ZITHROMAX) 250 MG tablet Take 1 tablet (250 mg total) by mouth daily. Take first 2 tablets together, then 1 every day until finished. 04/05/22  Yes Cristie Hem, MD  predniSONE (DELTASONE) 50 MG tablet Take 1 tablet (50 mg total) by mouth daily for 5 days. 04/05/22 04/10/22 Yes Cristie Hem, MD  acetaminophen (TYLENOL) 325 MG tablet Take 2 tablets (650 mg total) by mouth every 6 (six) hours as needed. 09/27/21   Jeanell Sparrow, DO  albuterol (PROVENTIL HFA;VENTOLIN HFA) 108 (90 BASE) MCG/ACT inhaler Inhale 2 puffs into the lungs every 6 (six) hours as needed for wheezing.    [provider]  allopurinol (ZYLOPRIM) 300 MG tablet Take 1 tablet by mouth daily. 10/12/21   [provider]  ALPRAZolam (XANAX XR) 1 MG 24 hr tablet Take 1 mg by mouth at bedtime.    [provider]  amLODipine (NORVASC) 10 MG  tablet TAKE ONE TABLET BY MOUTH DAILY HOLD DOSE FOR SYSTOLIC BLOOD PRESSURE BELOW 100; AVOID GRAPEFRUIT PRODUCTS 10/21/21   [provider]  apixaban (ELIQUIS) 5 MG TABS tablet Take 1 tablet (5 mg total) by mouth 2 (two) times daily. 02/28/22   Buford Dresser, MD  atorvastatin (LIPITOR) 80 MG tablet Take 40 mg by  mouth daily with supper.    [provider]  Carboxymethylcellulose Sodium 0.25 % SOLN Place 1 drop into both eyes 3 (three) times daily as needed (dry eyes/ irritation).    [provider]  Cholecalciferol (VITAMIN D3 PO) Take 1 tablet by mouth daily with supper.    [provider]  colchicine 0.6 MG tablet Take 1 tablet (0.6 mg total) by mouth daily. You can take 2 at a time for sign of flare, and then 1 in the next hour, followed by 1 daily until you have finished the medication. Patient not taking: Reported on 02/02/2022 01/11/22   Prosperi, Christian H, PA-C  diclofenac Sodium (VOLTAREN) 1 % GEL Apply 2 g topically 4 (four) times daily. 05/20/21   Terrilee Croak, MD  Ensure Max Protein (ENSURE MAX PROTEIN) LIQD Take 330 mLs (11 oz total) by mouth 2 (two) times daily. 12/12/18   [name], [name], MD  gabapentin (NEURONTIN) 300 MG capsule Take 300 mg by mouth 3 (three) times daily.    [provider]  guaifenesin (HUMIBID E) 400 MG TABS tablet Take 400 mg by mouth every morning.    [provider]  hydrALAZINE (APRESOLINE) 25 MG tablet TAKE ONE TABLET BY MOUTH DAILY FOR HYPERTENSION - HOLD DOSE FOR SYSTOLIC BLOOD PRESSURE LESS THAN 100 10/21/21   [provider]  Magnesium Oxide 420 MG TABS Take 420 mg by mouth daily with supper.    [provider]  metoprolol succinate (TOPROL-XL) 25 MG 24 hr tablet Take 0.5 tablets (12.5 mg total) by mouth daily. 09/04/21   Cherene Altes, MD  mometasone-formoterol (DULERA) 100-5 MCG/ACT AERO Inhale 2 puffs into the lungs 2 (two) times daily. 05/20/21   Terrilee Croak, MD  Multiple Vitamins-Minerals (MULTIVITAMIN WITH MINERALS) tablet Take 1 tablet by mouth daily with lunch.     [provider]  omeprazole (PRILOSEC) 20 MG capsule Take 20 mg by mouth daily with supper.    [provider]  oxyCODONE-acetaminophen (PERCOCET/ROXICET) 5-325 MG tablet Take 1 tablet by mouth every 6 (six)  hours as needed for severe pain. 01/11/22   Prosperi, Christian H, PA-C  potassium chloride SA (KLOR-CON M) 20 MEQ tablet Take 1 tablet (20 mEq total) by mouth daily with supper. 05/20/21   Terrilee Croak, MD  spironolactone (ALDACTONE) 25 MG tablet Take 25 mg by mouth every morning.    [provider]  thiamine 100 MG tablet Take 1 tablet (100 mg total) by mouth daily. Patient taking differently: Take 100 mg by mouth daily. Vitamin B1 06/05/20   [name], MD  torsemide (DEMADEX) 20 MG tablet TAKE ONE TABLET BY MOUTH TWICE A DAY - HOLD DOSE FOR SYSTOLIC BLOOD PRESSURE LESS THAN 100 10/21/21   [provider]  vitamin B-12 (CYANOCOBALAMIN) 500 MCG tablet Take 500 mcg by mouth daily with supper.    [provider]  Past Surgical History Past Surgical History:  Procedure Laterality Date   ACHILLES TENDON REPAIR Left    KNEE ARTHROSCOPY Left    RIGHT/LEFT HEART CATH AND CORONARY ANGIOGRAPHY N/A 04/09/2019   Procedure: RIGHT/LEFT HEART CATH AND CORONARY ANGIOGRAPHY;  Surgeon: Martinique, Peter M, MD;  Location: [facility];  Service: Cardiovascular;  Laterality: N/A;   TONSILLECTOMY     WRIST FRACTURE SURGERY Left    Family History Family History  Problem Relation Age of Onset   CAD Mother    Leukemia Mother    Heart attack Father    Heart attack Brother     Social History Social History   Tobacco Use   Smoking status: Former    Passive exposure: Never   Smokeless tobacco: Never  Vaping Use   Vaping Use: Never used  Substance Use Topics   Alcohol use: Yes    Alcohol/week: 8.0 standard drinks of alcohol    Types: 8 Cans of beer per week    Comment: 8 cans beer/day   Drug use: No   Allergies Oxcarbazepine and Zolpidem  Review of Systems Review of Systems  All other systems reviewed and are negative.   Physical  Exam Vital Signs  I have reviewed the triage vital signs BP (!) 142/69 (BP Location: Left Arm)   Pulse (!) 53   Temp 98.3 F (36.8 C) (Oral)   Resp 14   SpO2 98%  Physical Exam Vitals and nursing note reviewed.  Constitutional:      General: He is not in acute distress.    Appearance: Normal appearance.  HENT:     Mouth/Throat:     Mouth: Mucous membranes are moist.  Eyes:     Conjunctiva/sclera: Conjunctivae normal.  Cardiovascular:     Rate and Rhythm: Normal rate and regular rhythm.  Pulmonary:     Effort: Pulmonary effort is normal. No respiratory distress.     Comments: Diffuse trace expiratory wheezing Abdominal:     General: Abdomen is flat.     Palpations: Abdomen is soft.     Tenderness: There is no abdominal tenderness.  Musculoskeletal:     Right lower leg: No edema.     Left lower leg: No edema.  Skin:    General: Skin is warm and dry.     Capillary Refill: Capillary refill takes less than 2 seconds.  Neurological:     Mental Status: He is alert and oriented to person, place, and time. Mental status is at baseline.  Psychiatric:        Mood and Affect: Mood normal.        Behavior: Behavior normal.     ED Results and Treatments Labs (all labs ordered are listed, but only abnormal results are displayed) Labs Reviewed  BASIC METABOLIC PANEL - Abnormal; Notable for the following components:      Result Value   Sodium 131 (*)    CO2 20 (*)    Glucose, Bld 105 (*)    All other components within normal limits  CBC - Abnormal; Notable for the following components:   WBC 11.2 (*)    All other components within normal limits  BRAIN NATRIURETIC PEPTIDE - Abnormal; Notable for the following components:   B Natriuretic Peptide 157.8 (*)    All other components within normal limits  I-STAT VENOUS BLOOD GAS, ED - Abnormal; Notable for the following components:   pCO2, Ven 41.3 (*)    All other components within normal limits  RESP PANEL BY RT-PCR (FLU A&B,  COVID) ARPGX2                                                                                                                          Radiology DG Chest 2 View  Result Date: 04/05/2022 CLINICAL DATA:  Shortness of breath associated with two-week history of dry cough EXAM: CHEST - 2 VIEW COMPARISON:  Chest radiograph dated 11/02/2021 FINDINGS: Slightly hyperinflated lungs. Bibasilar patchy opacities. Minimal blunting of the right costophrenic angle. No pneumothorax. The heart size and mediastinal contours are within normal limits. The visualized skeletal structures are unremarkable. IMPRESSION: 1. Bibasilar patchy opacities, which may represent atelectasis, aspiration, or pneumonia. 2. Blunting of the right costophrenic angle, which may reflect a trace pleural effusion. Electronically Signed   By: Darrin Nipper M.D.   On: 04/05/2022 15:20    Pertinent labs & imaging results that were available during my care of the patient were reviewed by me and considered in my medical decision making (see MDM for details).  Medications Ordered in ED Medications  ipratropium-albuterol (DUONEB) 0.5-2.5 (3) MG/3ML nebulizer solution 3 mL (3 mLs Nebulization Given 04/05/22 1712)  predniSONE (DELTASONE) tablet 60 mg (60 mg Oral Given 04/05/22 1744)  furosemide (LASIX) tablet 40 mg (40 mg Oral Given 04/05/22 1802)                                                                                                                                     Procedures Procedures  (including critical care time)  Medical Decision Making / ED Course   MDM:  71 year old male presented to the emergency department with increased shortness of breath.  Patient overall well-appearing, not hypoxic, lungs with diffuse mild wheezing.  Vital signs otherwise reassuring.  Suspect likely COPD exacerbation, chest x-ray demonstrates what appears to be right lower lobe infiltrate on my read, may have mild community-acquired pneumonia, will  treat for COPD exacerbation and also for community-acquired pneumonia.  Patient curb 65 score 1 so patient is in low risk group, overall very well-appearing, do not think patient needs admission.  Could also represent mild volume overload, he has some trace pedal edema bilaterally, will check BNP.  We will check VBG to evaluate for CO2 retention but lower concern for any severe retention.  Doubt PE, patient compliant with Eliquis, denies pleuritic chest pain.  Doubt ACS, EKG not suggestive, no  typical symptoms.  Will reassess.  Clinical Course as of 04/05/22 1826  Tue Apr 05, 2022  1822 BNP very mildly elevated.  Given the leg swelling, gave dose of Lasix in the emergency department orally.  Would hold off on further dosing until patient sees his primary doctor.  Given chest x-ray finding of pneumonia, will start on Augmentin and azithromycin.  Patient not retaining CO2.  Curb 65 score is 1 so patient should be safe for discharge and outpatient follow-up. Will discharge patient to home. All questions answered. Patient comfortable with plan of discharge. Return precautions discussed with patient and specified on the after visit summary.  [WS]    Clinical Course User Index [WS] Cristie Hem, MD     Additional history obtained: -Additional history obtained from family -External records from outside source obtained and reviewed including: Chart review including previous notes, labs, imaging, consultation notes including office visit with cardiology 02/02/22   Lab Tests: -I ordered, reviewed, and interpreted labs.   The pertinent results include:   Labs Reviewed  BASIC METABOLIC PANEL - Abnormal; Notable for the following components:      Result Value   Sodium 131 (*)    CO2 20 (*)    Glucose, Bld 105 (*)    All other components within normal limits  CBC - Abnormal; Notable for the following components:   WBC 11.2 (*)    All other components within normal limits  BRAIN NATRIURETIC  PEPTIDE - Abnormal; Notable for the following components:   B Natriuretic Peptide 157.8 (*)    All other components within normal limits  I-STAT VENOUS BLOOD GAS, ED - Abnormal; Notable for the following components:   pCO2, Ven 41.3 (*)    All other components within normal limits  RESP PANEL BY RT-PCR (FLU A&B, COVID) ARPGX2    Notable for mild leukocytosis, no severe CO2 retention, mild elevated BNP  EKG   EKG Interpretation  Date/Time:  Tuesday April 05 2022 14:39:52 EST Ventricular Rate:  62 PR Interval:    QRS Duration: 90 QT Interval:  418 QTC Calculation: 424 R Axis:   75 Text Interpretation: Atrial fibrillation Low voltage QRS Cannot rule out Anterior infarct (cited on or before 30-Aug-2021) Abnormal ECG When compared with ECG of 02-Nov-2021 16:54, No significant change was found Confirmed by Garnette Gunner [phone]) on 04/05/2022 4:26:36 PM         Imaging Studies ordered: I ordered imaging studies including CXR On my interpretation imaging demonstrates pneumonia I independently visualized and interpreted imaging. I agree with the radiologist interpretation   Medicines ordered and prescription drug management: Meds ordered this encounter  Medications   DISCONTD: albuterol (VENTOLIN HFA) 108 (90 Base) MCG/ACT inhaler 2 puff   ipratropium-albuterol (DUONEB) 0.5-2.5 (3) MG/3ML nebulizer solution 3 mL   predniSONE (DELTASONE) tablet 60 mg   furosemide (LASIX) tablet 40 mg   amoxicillin-clavulanate (AUGMENTIN) 875-125 MG tablet    Sig: Take 1 tablet by mouth every 12 (twelve) hours.    Dispense:  14 tablet    Refill:  0   azithromycin (ZITHROMAX) 250 MG tablet    Sig: Take 1 tablet (250 mg total) by mouth daily. Take first 2 tablets together, then 1 every day until finished.    Dispense:  6 tablet    Refill:  0   predniSONE (DELTASONE) 50 MG tablet    Sig: Take 1 tablet (50 mg total) by mouth daily for 5 days.    Dispense:  5 tablet  Refill:  0    -I  have reviewed the patients home medicines and have made adjustments as needed  Social Determinants of Health:  Diagnosis or treatment significantly limited by social determinants of health: obesity and alcohol use   Reevaluation: After the interventions noted above, I reevaluated the patient and found that they have improved  Co morbidities that complicate the patient evaluation  Past Medical History:  Diagnosis Date   BPH (benign prostatic hyperplasia)    Cancer ([place])    prostate   COPD (chronic obstructive pulmonary disease) ([place])    Diabetes mellitus without complication ([place])    Hypercholesteremia    Hypertension       Dispostion: Disposition decision including need for hospitalization was considered, and patient discharged from emergency department.    Final Clinical Impression(s) / ED Diagnoses Final diagnoses:  Chronic obstructive pulmonary disease with acute lower respiratory infection ([place])     This chart was dictated using voice recognition software.  Despite best efforts to proofread,  errors can occur which can change the documentation meaning.    Cristie Hem, MD 04/05/22 [phone]

## 2022-06-14 ENCOUNTER — Inpatient Hospital Stay (HOSPITAL_BASED_OUTPATIENT_CLINIC_OR_DEPARTMENT_OTHER)
Admission: EM | Admit: 2022-06-14 | Discharge: 2022-06-17 | DRG: 191 | Disposition: A | Discharge status: Home / Self Care | Payer: No Typology Code available for payment source | Attending: Internal Medicine | Admitting: Internal Medicine

## 2022-06-14 ENCOUNTER — Other Ambulatory Visit: Payer: Self-pay

## 2022-06-14 ENCOUNTER — Encounter (HOSPITAL_BASED_OUTPATIENT_CLINIC_OR_DEPARTMENT_OTHER): Payer: Self-pay

## 2022-06-14 ENCOUNTER — Emergency Department (HOSPITAL_BASED_OUTPATIENT_CLINIC_OR_DEPARTMENT_OTHER): Payer: No Typology Code available for payment source

## 2022-06-14 DIAGNOSIS — Z7901 Long term (current) use of anticoagulants: Secondary | ICD-10-CM

## 2022-06-14 DIAGNOSIS — E1165 Type 2 diabetes mellitus with hyperglycemia: Secondary | ICD-10-CM | POA: Diagnosis present

## 2022-06-14 DIAGNOSIS — J45901 Unspecified asthma with (acute) exacerbation: Secondary | ICD-10-CM | POA: Diagnosis present

## 2022-06-14 DIAGNOSIS — I5032 Chronic diastolic (congestive) heart failure: Secondary | ICD-10-CM | POA: Diagnosis present

## 2022-06-14 DIAGNOSIS — I5030 Unspecified diastolic (congestive) heart failure: Secondary | ICD-10-CM | POA: Diagnosis present

## 2022-06-14 DIAGNOSIS — Z1152 Encounter for screening for COVID-19: Secondary | ICD-10-CM

## 2022-06-14 DIAGNOSIS — E871 Hypo-osmolality and hyponatremia: Secondary | ICD-10-CM | POA: Diagnosis present

## 2022-06-14 DIAGNOSIS — F1020 Alcohol dependence, uncomplicated: Secondary | ICD-10-CM | POA: Diagnosis present

## 2022-06-14 DIAGNOSIS — Z6841 Body Mass Index (BMI) 40.0 and over, adult: Secondary | ICD-10-CM

## 2022-06-14 DIAGNOSIS — K219 Gastro-esophageal reflux disease without esophagitis: Secondary | ICD-10-CM | POA: Diagnosis present

## 2022-06-14 DIAGNOSIS — E7800 Pure hypercholesterolemia, unspecified: Secondary | ICD-10-CM | POA: Diagnosis present

## 2022-06-14 DIAGNOSIS — I2510 Atherosclerotic heart disease of native coronary artery without angina pectoris: Secondary | ICD-10-CM | POA: Diagnosis present

## 2022-06-14 DIAGNOSIS — J441 Chronic obstructive pulmonary disease with (acute) exacerbation: Principal | ICD-10-CM | POA: Diagnosis present

## 2022-06-14 DIAGNOSIS — I480 Paroxysmal atrial fibrillation: Secondary | ICD-10-CM | POA: Diagnosis present

## 2022-06-14 DIAGNOSIS — J4489 Other specified chronic obstructive pulmonary disease: Secondary | ICD-10-CM | POA: Diagnosis present

## 2022-06-14 DIAGNOSIS — Z8546 Personal history of malignant neoplasm of prostate: Secondary | ICD-10-CM

## 2022-06-14 DIAGNOSIS — N179 Acute kidney failure, unspecified: Secondary | ICD-10-CM | POA: Insufficient documentation

## 2022-06-14 DIAGNOSIS — N400 Enlarged prostate without lower urinary tract symptoms: Secondary | ICD-10-CM | POA: Diagnosis present

## 2022-06-14 DIAGNOSIS — Z7984 Long term (current) use of oral hypoglycemic drugs: Secondary | ICD-10-CM

## 2022-06-14 DIAGNOSIS — J449 Chronic obstructive pulmonary disease, unspecified: Secondary | ICD-10-CM | POA: Diagnosis present

## 2022-06-14 DIAGNOSIS — I110 Hypertensive heart disease with heart failure: Secondary | ICD-10-CM | POA: Diagnosis present

## 2022-06-14 DIAGNOSIS — G4733 Obstructive sleep apnea (adult) (pediatric): Secondary | ICD-10-CM | POA: Diagnosis present

## 2022-06-14 DIAGNOSIS — Z79899 Other long term (current) drug therapy: Secondary | ICD-10-CM

## 2022-06-14 DIAGNOSIS — Z85820 Personal history of malignant melanoma of skin: Secondary | ICD-10-CM

## 2022-06-14 DIAGNOSIS — Z8249 Family history of ischemic heart disease and other diseases of the circulatory system: Secondary | ICD-10-CM

## 2022-06-14 DIAGNOSIS — Z888 Allergy status to other drugs, medicaments and biological substances status: Secondary | ICD-10-CM

## 2022-06-14 LAB — COMPREHENSIVE METABOLIC PANEL
ALT: 22 U/L (ref 0–44)
AST: 17 U/L (ref 15–41)
Albumin: 4.6 g/dL (ref 3.5–5.0)
Alkaline Phosphatase: 60 U/L (ref 38–126)
Anion gap: 9 (ref 5–15)
BUN: 47 mg/dL — ABNORMAL HIGH (ref 8–23)
CO2: 26 mmol/L (ref 22–32)
Calcium: 11.4 mg/dL — ABNORMAL HIGH (ref 8.9–10.3)
Chloride: 92 mmol/L — ABNORMAL LOW (ref 98–111)
Creatinine, Ser: 1.71 mg/dL — ABNORMAL HIGH (ref 0.61–1.24)
GFR, Estimated: 42 mL/min — ABNORMAL LOW (ref >60)
Glucose, Bld: 119 mg/dL — ABNORMAL HIGH (ref 70–99)
Potassium: 5.1 mmol/L (ref 3.5–5.1)
Sodium: 127 mmol/L — ABNORMAL LOW (ref 135–145)
Total Bilirubin: 0.9 mg/dL (ref 0.3–1.2)
Total Protein: 7.4 g/dL (ref 6.5–8.1)

## 2022-06-14 LAB — CBC WITH DIFFERENTIAL/PLATELET
Abs Immature Granulocytes: 0.07 10*3/uL (ref 0.00–0.07)
Basophils Absolute: 0.1 10*3/uL (ref 0.0–0.1)
Basophils Relative: 1 %
Eosinophils Absolute: 0.5 10*3/uL (ref 0.0–0.5)
Eosinophils Relative: 7 %
HCT: 40.6 % (ref 39.0–52.0)
Hemoglobin: 13.6 g/dL (ref 13.0–17.0)
Immature Granulocytes: 1 %
Lymphocytes Relative: 16 %
Lymphs Abs: 1.2 10*3/uL (ref 0.7–4.0)
MCH: 31.2 pg (ref 26.0–34.0)
MCHC: 33.5 g/dL (ref 30.0–36.0)
MCV: 93.1 fL (ref 80.0–100.0)
Monocytes Absolute: 0.4 10*3/uL (ref 0.1–1.0)
Monocytes Relative: 5 %
Neutro Abs: 5.4 10*3/uL (ref 1.7–7.7)
Neutrophils Relative %: 70 %
Platelets: 173 10*3/uL (ref 150–400)
RBC: 4.36 MIL/uL (ref 4.22–5.81)
RDW: 15.3 % (ref 11.5–15.5)
WBC: 7.6 10*3/uL (ref 4.0–10.5)
nRBC: 0 % (ref 0.0–0.2)

## 2022-06-14 LAB — TROPONIN I (HIGH SENSITIVITY)
Troponin I (High Sensitivity): 17 ng/L (ref <18)
Troponin I (High Sensitivity): 17 ng/L (ref <18)

## 2022-06-14 LAB — RESP PANEL BY RT-PCR (RSV, FLU A&B, COVID)  RVPGX2
Influenza A by PCR: NEGATIVE
Influenza B by PCR: NEGATIVE
Resp Syncytial Virus by PCR: NEGATIVE
SARS Coronavirus 2 by RT PCR: NEGATIVE

## 2022-06-14 LAB — BRAIN NATRIURETIC PEPTIDE: B Natriuretic Peptide: 129.7 pg/mL — ABNORMAL HIGH (ref 0.0–100.0)

## 2022-06-14 MED ORDER — SODIUM CHLORIDE 0.9 % IV BOLUS
1000.0000 mL | Freq: Once | INTRAVENOUS | Status: AC
  Administered 2022-06-14: 1000 mL via INTRAVENOUS

## 2022-06-14 MED ORDER — IPRATROPIUM-ALBUTEROL 0.5-2.5 (3) MG/3ML IN SOLN
3.0000 mL | RESPIRATORY_TRACT | Status: DC | PRN
  Administered 2022-06-14 – 2022-06-15 (×4): 3 mL via RESPIRATORY_TRACT
  Filled 2022-06-14 (×4): qty 3

## 2022-06-14 MED ORDER — ALBUTEROL SULFATE (2.5 MG/3ML) 0.083% IN NEBU
2.5000 mg | INHALATION_SOLUTION | Freq: Once | RESPIRATORY_TRACT | Status: AC
  Administered 2022-06-14: 2.5 mg via RESPIRATORY_TRACT
  Filled 2022-06-14: qty 3

## 2022-06-14 NOTE — ED Provider Notes (Signed)
[facility] Provider Note   CSN: [id number] Arrival date & time: 06/14/22  1730     History  Chief Complaint  Patient presents with   Shortness of Breath    [facility] is a 72 y.o. male with history significant for CHF, COPD, DM, OSA presents to the ED complaining of shortness of breath and productive cough with yellow sputum.  Patient states he had a similar episode back in October and was treated with steroids and antibiotics with improvement, but he states this improvement only lasted a couple of weeks.  Patient states he has been trying to manage his shortness of breath at home and it has not improved.  He states that as soon as he comes off of his CPAP from sleeping, he feels that he needs to use his rescue inhaler immediately.  Patient does have exertional dyspnea, but denies chest pain.       Home Medications Prior to Admission medications   Medication Sig Start Date End Date Taking? Authorizing Provider  acetaminophen (TYLENOL) 325 MG tablet Take 2 tablets (650 mg total) by mouth every 6 (six) hours as needed. 09/27/21   Jeanell Sparrow, DO  albuterol (PROVENTIL HFA;VENTOLIN HFA) 108 (90 BASE) MCG/ACT inhaler Inhale 2 puffs into the lungs every 6 (six) hours as needed for wheezing.    [provider]  allopurinol (ZYLOPRIM) 300 MG tablet Take 1 tablet by mouth daily. 10/12/21   [provider]  ALPRAZolam (XANAX XR) 1 MG 24 hr tablet Take 1 mg by mouth at bedtime.    [provider]  amLODipine (NORVASC) 10 MG tablet TAKE ONE TABLET BY MOUTH DAILY HOLD DOSE FOR SYSTOLIC BLOOD PRESSURE BELOW 100; AVOID GRAPEFRUIT PRODUCTS 10/21/21   [provider]  amoxicillin-clavulanate (AUGMENTIN) 875-125 MG tablet Take 1 tablet by mouth every 12 (twelve) hours. 04/05/22   Cristie Hem, MD  apixaban (ELIQUIS) 5 MG TABS tablet Take 1 tablet (5 mg total) by mouth 2 (two) times daily. 02/28/22   Buford Dresser, MD  atorvastatin (LIPITOR) 80 MG tablet Take 40 mg by mouth daily with supper.    [provider]  azithromycin (ZITHROMAX) 250 MG tablet Take 1 tablet (250 mg total) by mouth daily. Take first 2 tablets together, then 1 every day until finished. 04/05/22   Cristie Hem, MD  Carboxymethylcellulose Sodium 0.25 % SOLN Place 1 drop into both eyes 3 (three) times daily as needed (dry eyes/ irritation).    [provider]  Cholecalciferol (VITAMIN D3 PO) Take 1 tablet by mouth daily with supper.    [provider]  colchicine 0.6 MG tablet Take 1 tablet (0.6 mg total) by mouth daily. You can take 2 at a time for sign of flare, and then 1 in the next hour, followed by 1 daily until you have finished the medication. Patient not taking: Reported on 02/02/2022 01/11/22   Prosperi, Christian H, PA-C  diclofenac Sodium (VOLTAREN) 1 % GEL Apply 2 g topically 4 (four) times daily. 05/20/21   Terrilee Croak, MD  Ensure Max Protein (ENSURE MAX PROTEIN) LIQD Take 330 mLs (11 oz total) by mouth 2 (two) times daily. 12/12/18   [name], [name], MD  gabapentin (NEURONTIN) 300 MG capsule Take 300 mg by mouth 3 (three) times daily.    [provider]  guaifenesin (HUMIBID E) 400 MG TABS tablet Take 400 mg by mouth every morning.    [provider]  hydrALAZINE (  APRESOLINE) 25 MG tablet TAKE ONE TABLET BY MOUTH DAILY FOR HYPERTENSION - HOLD DOSE FOR SYSTOLIC BLOOD PRESSURE LESS THAN 100 10/21/21   [provider]  Magnesium Oxide 420 MG TABS Take 420 mg by mouth daily with supper.    [provider]  metoprolol succinate (TOPROL-XL) 25 MG 24 hr tablet Take 0.5 tablets (12.5 mg total) by mouth daily. 09/04/21   Cherene Altes, MD  mometasone-formoterol (DULERA) 100-5 MCG/ACT AERO Inhale 2 puffs into the lungs 2 (two) times daily. 05/20/21   Terrilee Croak, MD  Multiple Vitamins-Minerals (MULTIVITAMIN WITH MINERALS) tablet Take 1 tablet by mouth  daily with lunch.     [provider]  omeprazole (PRILOSEC) 20 MG capsule Take 20 mg by mouth daily with supper.    [provider]  oxyCODONE-acetaminophen (PERCOCET/ROXICET) 5-325 MG tablet Take 1 tablet by mouth every 6 (six) hours as needed for severe pain. 01/11/22   Prosperi, Christian H, PA-C  potassium chloride SA (KLOR-CON M) 20 MEQ tablet Take 1 tablet (20 mEq total) by mouth daily with supper. 05/20/21   Terrilee Croak, MD  spironolactone (ALDACTONE) 25 MG tablet Take 25 mg by mouth every morning.    [provider]  thiamine 100 MG tablet Take 1 tablet (100 mg total) by mouth daily. Patient taking differently: Take 100 mg by mouth daily. Vitamin B1 06/05/20   [name], MD  torsemide (DEMADEX) 20 MG tablet TAKE ONE TABLET BY MOUTH TWICE A DAY - HOLD DOSE FOR SYSTOLIC BLOOD PRESSURE LESS THAN 100 10/21/21   [provider]  vitamin B-12 (CYANOCOBALAMIN) 500 MCG tablet Take 500 mcg by mouth daily with supper.    [provider]      Allergies    Oxcarbazepine and Zolpidem    Review of Systems   Review of Systems  Constitutional:  Negative for fever.  Respiratory:  Positive for cough, chest tightness and shortness of breath.   Cardiovascular:  Positive for leg swelling. Negative for chest pain and palpitations.  Neurological:  Negative for dizziness, syncope and weakness.    Physical Exam Updated Vital Signs BP 126/60   Pulse (!) 59   Temp 97.7 F (36.5 C)   Resp 12   Ht '5\' 10"'$  (1.778 m)   Wt 131.5 kg   SpO2 100%   BMI 41.61 kg/m  Physical Exam Vitals and nursing note reviewed.  Constitutional:      General: He is not in acute distress.    Appearance: He is not ill-appearing.  HENT:     Mouth/Throat:     Mouth: Mucous membranes are moist.     Pharynx: Oropharynx is clear.  Cardiovascular:     Rate and Rhythm: Normal rate. Rhythm irregularly irregular.     Pulses: Normal pulses.     Heart sounds: Normal heart sounds.   Pulmonary:     Effort: Pulmonary effort is normal. No tachypnea or respiratory distress.     Breath sounds: Normal breath sounds and air entry. No wheezing or rales.     Comments: He is able to speak in full sentences without difficulty  Abdominal:     General: Abdomen is flat. Bowel sounds are normal. There is no distension.     Palpations: Abdomen is soft.     Tenderness: There is no abdominal tenderness.  Musculoskeletal:     Right lower leg: 1+ Edema present.     Left lower leg: 1+ Edema present.  Skin:    General: Skin  is warm and dry.     Capillary Refill: Capillary refill takes less than 2 seconds.     Coloration: Skin is not cyanotic or pale.  Neurological:     Mental Status: He is alert and oriented to person, place, and time. Mental status is at baseline.  Psychiatric:        Mood and Affect: Mood normal.        Behavior: Behavior normal.     ED Results / Procedures / Treatments   Labs (all labs ordered are listed, but only abnormal results are displayed) Labs Reviewed  COMPREHENSIVE METABOLIC PANEL - Abnormal; Notable for the following components:      Result Value   Sodium 127 (*)    Chloride 92 (*)    Glucose, Bld 119 (*)    BUN 47 (*)    Creatinine, Ser 1.71 (*)    Calcium 11.4 (*)    GFR, Estimated 42 (*)    All other components within normal limits  BRAIN NATRIURETIC PEPTIDE - Abnormal; Notable for the following components:   B Natriuretic Peptide 129.7 (*)    All other components within normal limits  RESP PANEL BY RT-PCR (RSV, FLU A&B, COVID)  [id number]  CBC WITH DIFFERENTIAL/PLATELET  TROPONIN I (HIGH SENSITIVITY)  TROPONIN I (HIGH SENSITIVITY)    EKG EKG Interpretation  Date/Time:  Tuesday June 14 2022 18:13:46 EST Ventricular Rate:  72 PR Interval:    QRS Duration: 82 QT Interval:  392 QTC Calculation: 429 R Axis:   80 Text Interpretation: Atrial fibrillation Low voltage QRS Cannot rule out Anterior infarct (cited on or before  30-Aug-2021) Abnormal ECG When compared with ECG of 05-Apr-2022 14:39, No significant change was found Confirmed by Regan Lemming ([id number]) on 06/14/2022 6:58:26 PM  Radiology DG Chest Port 1 View  Result Date: 06/14/2022 CLINICAL DATA:  Shortness of breath EXAM: PORTABLE CHEST 1 VIEW COMPARISON:  04/05/2022 FINDINGS: The heart size and mediastinal contours are within normal limits. Both lungs are clear. The visualized skeletal structures are unremarkable. IMPRESSION: No active disease. Electronically Signed   By: Donavan Foil M.D.   On: 06/14/2022 19:54    Procedures Procedures    Medications Ordered in ED Medications  ipratropium-albuterol (DUONEB) 0.5-2.5 (3) MG/3ML nebulizer solution 3 mL (3 mLs Nebulization Given 06/14/22 2244)  albuterol (PROVENTIL) (2.5 MG/3ML) 0.083% nebulizer solution 2.5 mg (2.5 mg Nebulization Given 06/14/22 1839)  sodium chloride 0.9 % bolus 1,000 mL (1,000 mLs Intravenous New Bag/Given 06/14/22 2103)    ED Course/ Medical Decision Making/ A&P                             Medical Decision Making Amount and/or Complexity of Data Reviewed Labs: ordered. Radiology: ordered.  Risk Prescription drug management.   This patient presents to the ED with chief complaint(s) of dyspnea, worse with exertion, chest tightness with pertinent past medical history of CHF, COPD, Afib, DM, HTN.  The complaint involves an extensive differential diagnosis and also carries with it a high risk of complications and morbidity.    The differential diagnosis includes COPD exacerbation, influenza, COVID, RSV, pneumonia, CHF exacerbation, pleural effusion, electrolyte disturbances, lower respiratory infection   The initial plan is to obtain baseline labs including troponin and BNP, chest x-ray, and EKG  Additional history obtained: Additional history obtained from  none  Initial Assessment:   On exam, patient does not appear to be in respiratory distress and is  able to speak in full  sentences.  Patient had already received 1 breathing treatment from respiratory therapy while in triage.  Lungs are clear to auscultation bilaterally with adequate lung volumes.  Heart rate is normal rate in the 60s with irregularly irregular rhythm consistent with patient's history of A-fib.  Skin is warm and dry and noncyanotic.  Abdomen is soft and nontender, nondistended.  Independent ECG/labs interpretation:  The following labs were independently interpreted:  CBC without evidence of leukocytosis, anemia, or abnormal platelet count. Metabolic panel with multiple electrolyte abnormalities including hyponatremia, hypochloremia, hypercalcemia.  There also appears to be new elevated creatinine at 1.71 above patient's baseline concerning for AKI. COVID, influenza, and RSV negative BNP 129.7 Initial and repeat troponin were both 17.  Independent visualization and interpretation of imaging: I independently visualized the following imaging with scope of interpretation limited to determining acute life threatening conditions related to emergency care: Chest x-ray, which revealed heart size is normal, no evidence of pleural effusion, pneumothorax, or infiltrate to suggest pneumonia or pulmonary edema  Treatment and Reassessment: Upon reassessment of patient, he had inspiratory and expiratory wheezing that was audible without a stethoscope and he was only able to speak in 3-4 word sentences.  Will continue to give patient breathing treatments of DuoNeb.  Discussed this with respiratory therapy.  Due to patient having rapid decline in breathing function I feel that he would benefit from hospital admission.   Consultations obtained:   Based on concerns for COPD exacerbation as well as AKI, I requested consultation with hospitalist and spoke with Dr. Alcario Drought who agreed with hospital admission.  Disposition:   Patient to be admitted to [facility] for COPD exacerbation and possible  AKI.         Final Clinical Impression(s) / ED Diagnoses Final diagnoses:  COPD exacerbation ([place])  AKI (acute kidney injury) [facility])    Rx / [place] Orders ED Discharge Orders     None         Pat Kocher, [place] 06/14/22 2336    Regan Lemming, MD 06/15/22 1844

## 2022-06-14 NOTE — ED Notes (Signed)
Peak Flow performed. Patient educated on procedure. Best attempt of 3 = 150

## 2022-06-14 NOTE — ED Triage Notes (Signed)
Pt arrives POV from home with c/o shortness of breath, productive cough.  Pt says he has been short of breath since October.  Reports history of CHF, COPD, sleep apena.  Pt short of breath in triage.

## 2022-06-14 NOTE — ED Notes (Signed)
RT in to assess patient. Upper airway wheezing noted (insp. And exp.) BBS diminished. PA notified and at bedside.

## 2022-06-15 ENCOUNTER — Other Ambulatory Visit (HOSPITAL_COMMUNITY): Payer: Medicare HMO

## 2022-06-15 ENCOUNTER — Inpatient Hospital Stay (HOSPITAL_COMMUNITY): Payer: No Typology Code available for payment source

## 2022-06-15 DIAGNOSIS — G4733 Obstructive sleep apnea (adult) (pediatric): Secondary | ICD-10-CM | POA: Diagnosis present

## 2022-06-15 DIAGNOSIS — Z6841 Body Mass Index (BMI) 40.0 and over, adult: Secondary | ICD-10-CM | POA: Diagnosis not present

## 2022-06-15 DIAGNOSIS — Z8249 Family history of ischemic heart disease and other diseases of the circulatory system: Secondary | ICD-10-CM | POA: Diagnosis not present

## 2022-06-15 DIAGNOSIS — Z888 Allergy status to other drugs, medicaments and biological substances status: Secondary | ICD-10-CM | POA: Diagnosis not present

## 2022-06-15 DIAGNOSIS — J45901 Unspecified asthma with (acute) exacerbation: Secondary | ICD-10-CM | POA: Diagnosis present

## 2022-06-15 DIAGNOSIS — Z85820 Personal history of malignant melanoma of skin: Secondary | ICD-10-CM | POA: Diagnosis not present

## 2022-06-15 DIAGNOSIS — I5032 Chronic diastolic (congestive) heart failure: Secondary | ICD-10-CM | POA: Diagnosis present

## 2022-06-15 DIAGNOSIS — N400 Enlarged prostate without lower urinary tract symptoms: Secondary | ICD-10-CM | POA: Diagnosis present

## 2022-06-15 DIAGNOSIS — I110 Hypertensive heart disease with heart failure: Secondary | ICD-10-CM | POA: Diagnosis present

## 2022-06-15 DIAGNOSIS — I480 Paroxysmal atrial fibrillation: Secondary | ICD-10-CM | POA: Diagnosis present

## 2022-06-15 DIAGNOSIS — Z8546 Personal history of malignant neoplasm of prostate: Secondary | ICD-10-CM | POA: Diagnosis not present

## 2022-06-15 DIAGNOSIS — J441 Chronic obstructive pulmonary disease with (acute) exacerbation: Secondary | ICD-10-CM

## 2022-06-15 DIAGNOSIS — F1020 Alcohol dependence, uncomplicated: Secondary | ICD-10-CM | POA: Diagnosis present

## 2022-06-15 DIAGNOSIS — Z79899 Other long term (current) drug therapy: Secondary | ICD-10-CM | POA: Diagnosis not present

## 2022-06-15 DIAGNOSIS — N179 Acute kidney failure, unspecified: Secondary | ICD-10-CM | POA: Diagnosis present

## 2022-06-15 DIAGNOSIS — I2510 Atherosclerotic heart disease of native coronary artery without angina pectoris: Secondary | ICD-10-CM | POA: Diagnosis present

## 2022-06-15 DIAGNOSIS — E7800 Pure hypercholesterolemia, unspecified: Secondary | ICD-10-CM | POA: Diagnosis present

## 2022-06-15 DIAGNOSIS — Z1152 Encounter for screening for COVID-19: Secondary | ICD-10-CM | POA: Diagnosis not present

## 2022-06-15 DIAGNOSIS — E1165 Type 2 diabetes mellitus with hyperglycemia: Secondary | ICD-10-CM | POA: Diagnosis present

## 2022-06-15 DIAGNOSIS — Z7984 Long term (current) use of oral hypoglycemic drugs: Secondary | ICD-10-CM | POA: Diagnosis not present

## 2022-06-15 DIAGNOSIS — Z7901 Long term (current) use of anticoagulants: Secondary | ICD-10-CM | POA: Diagnosis not present

## 2022-06-15 DIAGNOSIS — E871 Hypo-osmolality and hyponatremia: Secondary | ICD-10-CM | POA: Diagnosis present

## 2022-06-15 DIAGNOSIS — K219 Gastro-esophageal reflux disease without esophagitis: Secondary | ICD-10-CM | POA: Diagnosis present

## 2022-06-15 LAB — BASIC METABOLIC PANEL
Anion gap: 11 (ref 5–15)
BUN: 36 mg/dL — ABNORMAL HIGH (ref 8–23)
CO2: 25 mmol/L (ref 22–32)
Calcium: 11.2 mg/dL — ABNORMAL HIGH (ref 8.9–10.3)
Chloride: 98 mmol/L (ref 98–111)
Creatinine, Ser: 1.41 mg/dL — ABNORMAL HIGH (ref 0.61–1.24)
GFR, Estimated: 53 mL/min — ABNORMAL LOW (ref >60)
Glucose, Bld: 112 mg/dL — ABNORMAL HIGH (ref 70–99)
Potassium: 4 mmol/L (ref 3.5–5.1)
Sodium: 134 mmol/L — ABNORMAL LOW (ref 135–145)

## 2022-06-15 LAB — HEMOGLOBIN A1C
Hgb A1c MFr Bld: 5.9 % — ABNORMAL HIGH (ref 4.8–5.6)
Mean Plasma Glucose: 122.63 mg/dL

## 2022-06-15 LAB — BLOOD GAS, VENOUS
Acid-Base Excess: 2.5 mmol/L — ABNORMAL HIGH (ref 0.0–2.0)
Bicarbonate: 27.9 mmol/L (ref 20.0–28.0)
Drawn by: 1405
O2 Saturation: 86.9 %
Patient temperature: 37
pCO2, Ven: 45 mmHg (ref 44–60)
pH, Ven: 7.4 (ref 7.25–7.43)
pO2, Ven: 57 mmHg — ABNORMAL HIGH (ref 32–45)

## 2022-06-15 LAB — URIC ACID: Uric Acid, Serum: 4.1 mg/dL (ref 3.7–8.6)

## 2022-06-15 LAB — URINALYSIS, ROUTINE W REFLEX MICROSCOPIC
Bilirubin Urine: NEGATIVE
Glucose, UA: 50 mg/dL — AB
Hgb urine dipstick: NEGATIVE
Ketones, ur: NEGATIVE mg/dL
Leukocytes,Ua: NEGATIVE
Nitrite: NEGATIVE
Protein, ur: NEGATIVE mg/dL
Specific Gravity, Urine: 1.013 (ref 1.005–1.030)
pH: 5 (ref 5.0–8.0)

## 2022-06-15 LAB — GLUCOSE, CAPILLARY
Glucose-Capillary: 130 mg/dL — ABNORMAL HIGH (ref 70–99)
Glucose-Capillary: 175 mg/dL — ABNORMAL HIGH (ref 70–99)
Glucose-Capillary: 207 mg/dL — ABNORMAL HIGH (ref 70–99)

## 2022-06-15 LAB — CBG MONITORING, ED: Glucose-Capillary: 126 mg/dL — ABNORMAL HIGH (ref 70–99)

## 2022-06-15 LAB — SODIUM, URINE, RANDOM: Sodium, Ur: <10 mmol/L

## 2022-06-15 MED ORDER — METOPROLOL SUCCINATE ER 25 MG PO TB24
12.5000 mg | ORAL_TABLET | Freq: Every day | ORAL | Status: DC
  Administered 2022-06-15 – 2022-06-17 (×3): 12.5 mg via ORAL
  Filled 2022-06-15 (×3): qty 1

## 2022-06-15 MED ORDER — APIXABAN 5 MG PO TABS
5.0000 mg | ORAL_TABLET | Freq: Two times a day (BID) | ORAL | Status: DC
  Administered 2022-06-15 – 2022-06-17 (×5): 5 mg via ORAL
  Filled 2022-06-15 (×5): qty 1

## 2022-06-15 MED ORDER — GABAPENTIN 100 MG PO CAPS
100.0000 mg | ORAL_CAPSULE | Freq: Three times a day (TID) | ORAL | Status: DC
  Administered 2022-06-15 – 2022-06-17 (×6): 100 mg via ORAL
  Filled 2022-06-15 (×6): qty 1

## 2022-06-15 MED ORDER — BENZONATATE 100 MG PO CAPS
200.0000 mg | ORAL_CAPSULE | Freq: Two times a day (BID) | ORAL | Status: DC
  Administered 2022-06-15 – 2022-06-17 (×5): 200 mg via ORAL
  Filled 2022-06-15 (×5): qty 2

## 2022-06-15 MED ORDER — DOXYCYCLINE HYCLATE 100 MG PO TABS
100.0000 mg | ORAL_TABLET | Freq: Two times a day (BID) | ORAL | Status: DC
  Administered 2022-06-15 – 2022-06-17 (×5): 100 mg via ORAL
  Filled 2022-06-15 (×5): qty 1

## 2022-06-15 MED ORDER — METHYLPREDNISOLONE SODIUM SUCC 40 MG IJ SOLR
40.0000 mg | Freq: Every day | INTRAMUSCULAR | Status: AC
  Administered 2022-06-15 – 2022-06-16 (×2): 40 mg via INTRAVENOUS
  Filled 2022-06-15 (×2): qty 1

## 2022-06-15 MED ORDER — LORAZEPAM 1 MG PO TABS: 1.0000 mg | ORAL_TABLET | ORAL | Status: DC | PRN

## 2022-06-15 MED ORDER — INSULIN ASPART 100 UNIT/ML IJ SOLN
0.0000 [IU] | Freq: Three times a day (TID) | INTRAMUSCULAR | Status: DC
  Administered 2022-06-15 – 2022-06-16 (×2): 3 [IU] via SUBCUTANEOUS
  Administered 2022-06-16: 4 [IU] via SUBCUTANEOUS
  Administered 2022-06-16: 5 [IU] via SUBCUTANEOUS
  Administered 2022-06-17: 2 [IU] via SUBCUTANEOUS

## 2022-06-15 MED ORDER — MAGNESIUM OXIDE -MG SUPPLEMENT 400 (240 MG) MG PO TABS
400.0000 mg | ORAL_TABLET | Freq: Every day | ORAL | Status: DC
  Administered 2022-06-15 – 2022-06-16 (×2): 400 mg via ORAL
  Filled 2022-06-15 (×2): qty 1

## 2022-06-15 MED ORDER — ENOXAPARIN SODIUM 80 MG/0.8ML IJ SOSY: 65.0000 mg | PREFILLED_SYRINGE | INTRAMUSCULAR | Status: DC

## 2022-06-15 MED ORDER — AMLODIPINE BESYLATE 10 MG PO TABS
10.0000 mg | ORAL_TABLET | Freq: Every day | ORAL | Status: DC
  Administered 2022-06-15 – 2022-06-17 (×2): 10 mg via ORAL
  Filled 2022-06-15 (×3): qty 1

## 2022-06-15 MED ORDER — METHYLPREDNISOLONE SODIUM SUCC 40 MG IJ SOLR: 40.0000 mg | INTRAMUSCULAR | Status: DC

## 2022-06-15 MED ORDER — POTASSIUM CHLORIDE CRYS ER 20 MEQ PO TBCR: 20.0000 meq | EXTENDED_RELEASE_TABLET | Freq: Every day | ORAL | Status: DC

## 2022-06-15 MED ORDER — ALLOPURINOL 300 MG PO TABS
400.0000 mg | ORAL_TABLET | Freq: Every day | ORAL | Status: DC
  Administered 2022-06-15 – 2022-06-17 (×3): 400 mg via ORAL
  Filled 2022-06-15 (×3): qty 1

## 2022-06-15 MED ORDER — THIAMINE MONONITRATE 100 MG PO TABS
100.0000 mg | ORAL_TABLET | Freq: Every day | ORAL | Status: DC
  Administered 2022-06-15 – 2022-06-17 (×3): 100 mg via ORAL
  Filled 2022-06-15 (×3): qty 1

## 2022-06-15 MED ORDER — PREDNISONE 20 MG PO TABS: 40.0000 mg | ORAL_TABLET | Freq: Every day | ORAL | Status: DC

## 2022-06-15 MED ORDER — ACETAMINOPHEN 325 MG PO TABS: 650.0000 mg | ORAL_TABLET | Freq: Four times a day (QID) | ORAL | Status: DC | PRN

## 2022-06-15 MED ORDER — FOLIC ACID 1 MG PO TABS
1.0000 mg | ORAL_TABLET | Freq: Every day | ORAL | Status: DC
  Administered 2022-06-15 – 2022-06-17 (×3): 1 mg via ORAL
  Filled 2022-06-15 (×3): qty 1

## 2022-06-15 MED ORDER — IPRATROPIUM-ALBUTEROL 0.5-2.5 (3) MG/3ML IN SOLN
3.0000 mL | Freq: Four times a day (QID) | RESPIRATORY_TRACT | Status: DC
  Administered 2022-06-15 – 2022-06-16 (×5): 3 mL via RESPIRATORY_TRACT
  Filled 2022-06-15 (×5): qty 3

## 2022-06-15 MED ORDER — MULTI-VITAMIN/MINERALS PO TABS: 1.0000 | ORAL_TABLET | ORAL | Status: DC

## 2022-06-15 MED ORDER — HYDRALAZINE HCL 25 MG PO TABS
25.0000 mg | ORAL_TABLET | Freq: Every day | ORAL | Status: DC
  Administered 2022-06-15 – 2022-06-17 (×2): 25 mg via ORAL
  Filled 2022-06-15 (×3): qty 1

## 2022-06-15 MED ORDER — ALLOPURINOL 100 MG PO TABS: 100.0000 mg | ORAL_TABLET | Freq: Every day | ORAL | Status: DC

## 2022-06-15 MED ORDER — BUDESONIDE 0.5 MG/2ML IN SUSP
2.0000 mg | Freq: Two times a day (BID) | RESPIRATORY_TRACT | Status: DC
  Administered 2022-06-15 – 2022-06-16 (×3): 2 mg via RESPIRATORY_TRACT
  Filled 2022-06-15 (×3): qty 8

## 2022-06-15 MED ORDER — PANTOPRAZOLE SODIUM 40 MG PO TBEC
40.0000 mg | DELAYED_RELEASE_TABLET | Freq: Every day | ORAL | Status: DC
  Administered 2022-06-15 – 2022-06-17 (×3): 40 mg via ORAL
  Filled 2022-06-15 (×2): qty 1

## 2022-06-15 MED ORDER — ALPRAZOLAM 0.5 MG PO TABS: 1.0000 mg | ORAL_TABLET | Freq: Every evening | ORAL | Status: DC | PRN

## 2022-06-15 MED ORDER — MOMETASONE FURO-FORMOTEROL FUM 200-5 MCG/ACT IN AERO
2.0000 | INHALATION_SPRAY | Freq: Two times a day (BID) | RESPIRATORY_TRACT | Status: DC
  Administered 2022-06-16 – 2022-06-17 (×3): 2 via RESPIRATORY_TRACT
  Filled 2022-06-15: qty 8.8

## 2022-06-15 MED ORDER — ATORVASTATIN CALCIUM 40 MG PO TABS
40.0000 mg | ORAL_TABLET | Freq: Every day | ORAL | Status: DC
  Administered 2022-06-15 – 2022-06-16 (×2): 40 mg via ORAL
  Filled 2022-06-15 (×2): qty 1

## 2022-06-15 MED ORDER — METHYLPREDNISOLONE SODIUM SUCC 125 MG IJ SOLR: 125.0000 mg | Freq: Once | INTRAMUSCULAR | Status: DC

## 2022-06-15 MED ORDER — GUAIFENESIN ER 600 MG PO TB12
1200.0000 mg | ORAL_TABLET | Freq: Two times a day (BID) | ORAL | Status: DC
  Administered 2022-06-15 – 2022-06-17 (×5): 1200 mg via ORAL
  Filled 2022-06-15 (×5): qty 2

## 2022-06-15 MED ORDER — ADULT MULTIVITAMIN W/MINERALS CH
1.0000 | ORAL_TABLET | Freq: Every day | ORAL | Status: DC
  Administered 2022-06-15 – 2022-06-17 (×2): 1 via ORAL
  Filled 2022-06-15 (×3): qty 1

## 2022-06-15 MED ORDER — THIAMINE HCL 100 MG/ML IJ SOLN
100.0000 mg | Freq: Every day | INTRAMUSCULAR | Status: DC
  Filled 2022-06-15 (×3): qty 2

## 2022-06-15 MED ORDER — ALBUTEROL SULFATE HFA 108 (90 BASE) MCG/ACT IN AERS: 2.0000 | INHALATION_SPRAY | Freq: Four times a day (QID) | RESPIRATORY_TRACT | Status: DC | PRN

## 2022-06-15 MED ORDER — VITAMIN B-12 100 MCG PO TABS
500.0000 ug | ORAL_TABLET | Freq: Every day | ORAL | Status: DC
  Administered 2022-06-15 – 2022-06-16 (×2): 500 ug via ORAL
  Filled 2022-06-15 (×5): qty 5

## 2022-06-15 MED ORDER — LORAZEPAM 0.5 MG PO TABS: 0.5000 mg | ORAL_TABLET | ORAL | Status: DC | PRN

## 2022-06-15 MED ORDER — ALBUTEROL SULFATE (2.5 MG/3ML) 0.083% IN NEBU: 2.5000 mg | INHALATION_SOLUTION | RESPIRATORY_TRACT | Status: DC | PRN

## 2022-06-15 NOTE — ED Notes (Signed)
Patient removed from CPAP at this time. Patient experiencing insp and exp. Wheezing after using urinal. PRN Duoneb given.

## 2022-06-15 NOTE — Progress Notes (Signed)
Attempted to call report to 6N, nurse states will return call when able to receive.

## 2022-06-15 NOTE — ED Notes (Signed)
Pt given oatmeal for breakfast

## 2022-06-15 NOTE — Progress Notes (Signed)
Patient transferred from [facility] via stretcher. Patient states that he though he was going to [facility], patient notified his wife of location. Patient is A&O x 4 and able to make his needs known. Bilateral wheezing, diminished bilateral lower lobes. ABD soft, bowel sounds x 4. +2 Pitting edema BLE, ted hose on. Feet warm to touch, cap refill less than 3 seconds. Denies pain or discomfort at this time.

## 2022-06-15 NOTE — H&P (Signed)
History and Physical    Francis Cardenas QIO:[id number] DOB: [birth year] DOA: 06/14/2022  PCP: Gerome Sam, MD (Confirm with patient/family/NH records and if not entered, this has to be entered at [facility] point of entry) Patient coming from: Home  I have personally briefly reviewed patient's old medical records in [place]  Chief Complaint: Wheezing, cough, shortness of breath  HPI: Francis Cardenas is a 72 y.o. male with medical history significant of chronic HFpEF, PAF on Eliquis, COPD/asthma, prostate cancer, BPH, HTN, HLD, IIDM, morbid obesity, presented with worsening of wheezing, shortness of breath.  Patient reported he has a history of asthma/COPD with no formal lung function test.  Recently back to 2-3 months ago, she started to have wheezing, dry cough, etiology was treated with antibiotics and steroid in November, with some success.  However last 3 to 4 weeks, patient started to have wheezing, productive cough with light yellowish sputum more in the morning and increasing exertional dyspnea denies any chest pain fever chills.  He has been using CPAP for his OSA at night, and reported every evening, when he got off the CPAP mask immediately he started to have wheezing and cough with copious yellowish sputum.  He did not have a chance to go back to [place] to visit his PCP next time and came to [place] ED.  Patient reported he frequently checking his pulse ox at home and most time readings are above 92%  ED Course: No tachycardia no hypotension nonhypoxic.  Chest x-ray showed no acute infiltrates.  Blood work showed acute hyponatremia 127, AKI with creatinine 1.7 compared to baseline 0.9.  Patient was given breathing treatment at [facility] ED.  Troponin negative x 2, proBNP 120  Review of Systems: As per HPI otherwise 14 point review of systems negative.    Past Medical History:  Diagnosis Date   BPH (benign prostatic hyperplasia)    Cancer (HCC)    prostate   COPD  (chronic obstructive pulmonary disease) (HCC)    Diabetes mellitus without complication (HCC)    Hypercholesteremia    Hypertension     Past Surgical History:  Procedure Laterality Date   ACHILLES TENDON REPAIR Left    KNEE ARTHROSCOPY Left    RIGHT/LEFT HEART CATH AND CORONARY ANGIOGRAPHY N/A 04/09/2019   Procedure: RIGHT/LEFT HEART CATH AND CORONARY ANGIOGRAPHY;  Surgeon: Martinique, Peter M, MD;  Location: [facility];  Service: Cardiovascular;  Laterality: N/A;   TONSILLECTOMY     WRIST FRACTURE SURGERY Left      reports that he has quit smoking. He has never been exposed to tobacco smoke. He has never used smokeless tobacco. He reports current alcohol use of about 8.0 standard drinks of alcohol per week. He reports that he does not use drugs.  Allergies  Allergen Reactions   Oxcarbazepine Other (See Comments)    Dizziness/ lightheaded   Zolpidem Nausea Only    Family History  Problem Relation Age of Onset   CAD Mother    Leukemia Mother    Heart attack Father    Heart attack Brother      Prior to Admission medications   Medication Sig Start Date End Date Taking? Authorizing Provider  acetaminophen (TYLENOL) 325 MG tablet Take 2 tablets (650 mg total) by mouth every 6 (six) hours as needed. 09/27/21  Yes [name], DO  albuterol (PROVENTIL HFA;VENTOLIN HFA) 108 (90 BASE) MCG/ACT inhaler Inhale 2 puffs into the lungs every 6 (six) hours as needed for wheezing.  Yes [provider]  allopurinol (ZYLOPRIM) 100 MG tablet Take 100 mg by mouth daily. 05/07/22  Yes [provider]  allopurinol (ZYLOPRIM) 300 MG tablet Take 1 tablet by mouth daily. 10/12/21  Yes [provider]  ALPRAZolam Duanne Moron) 1 MG tablet Take 1 mg by mouth at bedtime. 05/24/22  Yes [provider]  amLODipine (NORVASC) 10 MG tablet Take 10 mg by mouth daily. 10/21/21  Yes [provider]  apixaban (ELIQUIS) 5 MG TABS tablet Take 1 tablet (5 mg total) by mouth 2  (two) times daily. 02/28/22  Yes Buford Dresser, MD  atorvastatin (LIPITOR) 80 MG tablet Take 40 mg by mouth daily with supper.   Yes [provider]  empagliflozin (JARDIANCE) 25 MG TABS tablet Take 12.5 mg by mouth 2 (two) times daily.   Yes [provider]  gabapentin (NEURONTIN) 300 MG capsule Take 300 mg by mouth 3 (three) times daily.   Yes [provider]  guaifenesin (HUMIBID E) 400 MG TABS tablet Take 400 mg by mouth every morning.   Yes [provider]  hydrALAZINE (APRESOLINE) 25 MG tablet Take 25 mg by mouth daily. 10/21/21  Yes [provider]  Magnesium Oxide 420 MG TABS Take 420 mg by mouth daily with supper.   Yes [provider]  meloxicam (MOBIC) 15 MG tablet Take 15 mg by mouth daily. 05/07/22  Yes [provider]  metoprolol succinate (TOPROL-XL) 25 MG 24 hr tablet Take 0.5 tablets (12.5 mg total) by mouth daily. 09/04/21  Yes Cherene Altes, MD  Multiple Vitamins-Minerals (MULTIVITAMIN WITH MINERALS) tablet Take 1 tablet by mouth every Monday, Wednesday, and Friday.   Yes [provider]  omeprazole (PRILOSEC) 20 MG capsule Take 20 mg by mouth daily with supper.   Yes [provider]  potassium chloride SA (KLOR-CON M) 20 MEQ tablet Take 1 tablet (20 mEq total) by mouth daily with supper. 05/20/21  Yes Dahal, Marlowe Aschoff, MD  spironolactone (ALDACTONE) 25 MG tablet Take 25 mg by mouth every morning.   Yes [provider]  thiamine 100 MG tablet Take 1 tablet (100 mg total) by mouth daily. Patient taking differently: Take 100 mg by mouth daily. Vitamin B1 06/05/20  Yes [name], MD  Tiotropium Bromide-Olodaterol 2.5-2.5 MCG/ACT AERS Inhale 2 each into the lungs daily. 01/07/22  Yes [provider]  torsemide (DEMADEX) 20 MG tablet Take 20 mg by mouth 2 (two) times daily. 10/21/21  Yes [provider]  vitamin B-12 (CYANOCOBALAMIN) 500 MCG tablet Take 500 mcg by mouth daily  with supper.   Yes [provider]  amoxicillin-clavulanate (AUGMENTIN) 875-125 MG tablet Take 1 tablet by mouth every 12 (twelve) hours. Patient not taking: Reported on 06/15/2022 04/05/22   Cristie Hem, MD  azithromycin (ZITHROMAX) 250 MG tablet Take 1 tablet (250 mg total) by mouth daily. Take first 2 tablets together, then 1 every day until finished. Patient not taking: Reported on 06/15/2022 04/05/22   Cristie Hem, MD  colchicine 0.6 MG tablet Take 1 tablet (0.6 mg total) by mouth daily. You can take 2 at a time for sign of flare, and then 1 in the next hour, followed by 1 daily until you have finished the medication. Patient not taking: Reported on 02/02/2022 01/11/22   Prosperi, Christian H, PA-C  diclofenac Sodium (VOLTAREN) 1 % GEL Apply 2 g topically 4 (four) times daily. Patient not taking: Reported on 06/15/2022 05/20/21   Terrilee Croak, MD  Ensure  Max Protein (ENSURE MAX PROTEIN) LIQD Take 330 mLs (11 oz total) by mouth 2 (two) times daily. Patient not taking: Reported on 06/15/2022 12/12/18   [name], [name], MD  mometasone-formoterol (DULERA) 100-5 MCG/ACT AERO Inhale 2 puffs into the lungs 2 (two) times daily. Patient not taking: Reported on 06/15/2022 05/20/21   Terrilee Croak, MD  oxyCODONE-acetaminophen (PERCOCET/ROXICET) 5-325 MG tablet Take 1 tablet by mouth every 6 (six) hours as needed for severe pain. Patient not taking: Reported on 06/15/2022 01/11/22   Dorien Chihuahua    Physical Exam: Vitals:   06/15/22 0558 06/15/22 0730 06/15/22 0820 06/15/22 1143  BP: (!) 126/55 (!) 152/64  121/66  Pulse: 69 67  70  Resp: '18 18  16  '$ Temp: 98.8 F (37.1 C)   98.3 F (36.8 C)  TempSrc: Oral   Oral  SpO2: 100% 98% 98% 97%  Weight:      Height:        Constitutional: NAD, calm, comfortable Vitals:   06/15/22 0558 06/15/22 0730 06/15/22 0820 06/15/22 1143  BP: (!) 126/55 (!) 152/64  121/66  Pulse: 69 67  70  Resp: '18 18  16  '$ Temp: 98.8 F (37.1  C)   98.3 F (36.8 C)  TempSrc: Oral   Oral  SpO2: 100% 98% 98% 97%  Weight:      Height:       Eyes: PERRL, lids and conjunctivae normal ENMT: Mucous membranes are moist. Posterior pharynx clear of any exudate or lesions.Normal dentition.  Neck: normal, supple, no masses, no thyromegaly Respiratory: Diminished breathing sound bilaterally, diffused wheezing no crackles, increasing breathing effort. No accessory muscle use.  Cardiovascular: Regular rate and rhythm, no murmurs / rubs / gallops. trace extremity edema. 2+ pedal pulses. No carotid bruits.  Abdomen: no tenderness, no masses palpated. No hepatosplenomegaly. Bowel sounds positive.  Musculoskeletal: no clubbing / cyanosis. No joint deformity upper and lower extremities. Good ROM, no contractures. Normal muscle tone.  Skin: no rashes, lesions, ulcers. No induration Neurologic: CN 2-12 grossly intact. Sensation intact, DTR normal. Strength 5/5 in all 4.  Psychiatric: Normal judgment and insight. Alert and oriented x 3. Normal mood.     Labs on Admission: I have personally reviewed following labs and imaging studies  CBC: Recent Labs  Lab 06/14/22 2004  WBC 7.6  NEUTROABS 5.4  HGB 13.6  HCT 40.6  MCV 93.1  PLT 017   Basic Metabolic Panel: Recent Labs  Lab 06/14/22 2004  NA 127*  K 5.1  CL 92*  CO2 26  GLUCOSE 119*  BUN 47*  CREATININE 1.71*  CALCIUM 11.4*   GFR: Estimated Creatinine Clearance: 52.5 mL/min (A) (by C-G formula based on SCr of 1.71 mg/dL (H)). Liver Function Tests: Recent Labs  Lab 06/14/22 2004  AST 17  ALT 22  ALKPHOS 60  BILITOT 0.9  PROT 7.4  ALBUMIN 4.6   No results for input(s): "LIPASE", "AMYLASE" in the last 168 hours. No results for input(s): "AMMONIA" in the last 168 hours. Coagulation Profile: No results for input(s): "INR", "PROTIME" in the last 168 hours. Cardiac Enzymes: No results for input(s): "CKTOTAL", "CKMB", "CKMBINDEX", "TROPONINI" in the last 168 hours. BNP  (last 3 results) No results for input(s): "PROBNP" in the last 8760 hours. HbA1C: No results for input(s): "HGBA1C" in the last 72 hours. CBG: Recent Labs  Lab 06/15/22 1027 06/15/22 1139  GLUCAP 126* 130*   Lipid Profile: No results for input(s): "CHOL", "HDL", "LDLCALC", "TRIG", "CHOLHDL", "LDLDIRECT" in  the last 72 hours. Thyroid Function Tests: No results for input(s): "TSH", "T4TOTAL", "FREET4", "T3FREE", "THYROIDAB" in the last 72 hours. Anemia Panel: No results for input(s): "VITAMINB12", "FOLATE", "FERRITIN", "TIBC", "IRON", "RETICCTPCT" in the last 72 hours. Urine analysis:    Component Value Date/Time   COLORURINE YELLOW 08/30/2021 1300   APPEARANCEUR CLEAR 08/30/2021 1300   LABSPEC 1.011 08/30/2021 1300   PHURINE 7.0 08/30/2021 1300   GLUCOSEU NEGATIVE 08/30/2021 1300   HGBUR NEGATIVE 08/30/2021 1300   BILIRUBINUR NEGATIVE 08/30/2021 1300   KETONESUR NEGATIVE 08/30/2021 1300   PROTEINUR 30 (A) 08/30/2021 1300   UROBILINOGEN 0.2 03/24/2013 1255   NITRITE NEGATIVE 08/30/2021 1300   LEUKOCYTESUR NEGATIVE 08/30/2021 1300    Radiological Exams on Admission: DG Chest Port 1 View  Result Date: 06/14/2022 CLINICAL DATA:  Shortness of breath EXAM: PORTABLE CHEST 1 VIEW COMPARISON:  04/05/2022 FINDINGS: The heart size and mediastinal contours are within normal limits. Both lungs are clear. The visualized skeletal structures are unremarkable. IMPRESSION: No active disease. Electronically Signed   By: Donavan Foil M.D.   On: 06/14/2022 19:54    EKG: Independently reviewed.  Rate controlled A-fib, no acute ST changes.  Assessment/Plan Principal Problem:   COPD exacerbation (HCC) Active Problems:   Alcohol dependence (HCC)   COPD (chronic obstructive pulmonary disease) (HCC)   Class 2 severe obesity due to excess calories with serious comorbidity and body mass index (BMI) of 37.0 to 37.9 in adult (HCC)   (HFpEF) heart failure with preserved ejection fraction ([place])   AKI  (acute kidney injury) ([place])  (please populate well all problems here in Problem List. (For example, if patient is on BP meds at home and you resume or decide to hold them, it is a problem that needs to be her. Same for CAD, COPD, HLD and so on)  Acute asthma/COPD exacerbation -Failed outpatient management. -Given there is a change in color of sputum production, start patient on doxycycline for 5 days -LABA, ICS -IV Solu-Medrol x 1, then twice daily bridging for prednisone 40 milligrams daily -Refer to ambulatory pulmonary for lung function test. -Incentive spirometry -Atypical infection study  AKI -Check UA -Clinically appears to be euvolemic, mild related to chronic NSAID use for chronic back pain and diabetic neuropathy -Hold off diuresis for today -Check renal ultrasound, check uric acid level -Hold off spironolactone -Hold off NSAIDs  HTN, refractory -BP fairly controlled -Continue amlodipine, metoprolol -Hold off torsemide and spironolactone  IIDM -Sliding scale for now -Resume p.o. diabetic medication after kidney function stabilized.  Hyponatremia -Euvolemic, might related to alcohol abuse and AKI -Fluid restriction -Check urine sodium and osmolality  EtOH abuse -No symptoms signs of active withdrawal -CIWA protocol with as needed benzos  PAF -Rate controlled -Continue Eliquis  HFpEF -Clinically appears to be euvolemic, torsemide on hold due to AKI -Outpatient follow-up with cardiology  OSA -CPAP HS  DVT prophylaxis: Eliquis Code Status: Full code Family Communication: None at bedside Disposition Plan: Patient is sick with significant COPD/asthma exacerbation with outpatient management requiring inpatient treatment with IV steroids and close monitoring, expect more than 2 midnight hospital stay Consults called: None Admission status: Tele   Lequita Halt MD [facility] Pager [phone]  06/15/2022, 1:06 PM

## 2022-06-15 NOTE — ED Notes (Signed)
Thomas at [facility] will send transport.-ABB(NS)

## 2022-06-15 NOTE — ED Notes (Signed)
Report given given to [facility]link  ?

## 2022-06-16 DIAGNOSIS — J441 Chronic obstructive pulmonary disease with (acute) exacerbation: Secondary | ICD-10-CM | POA: Diagnosis not present

## 2022-06-16 DIAGNOSIS — I5032 Chronic diastolic (congestive) heart failure: Secondary | ICD-10-CM

## 2022-06-16 DIAGNOSIS — N179 Acute kidney failure, unspecified: Secondary | ICD-10-CM | POA: Diagnosis not present

## 2022-06-16 DIAGNOSIS — Z6837 Body mass index (BMI) 37.0-37.9, adult: Secondary | ICD-10-CM

## 2022-06-16 DIAGNOSIS — J439 Emphysema, unspecified: Secondary | ICD-10-CM

## 2022-06-16 LAB — CBC WITH DIFFERENTIAL/PLATELET
Abs Immature Granulocytes: 0.1 10*3/uL — ABNORMAL HIGH (ref 0.00–0.07)
Basophils Absolute: 0 10*3/uL (ref 0.0–0.1)
Basophils Relative: 0 %
Eosinophils Absolute: 0 10*3/uL (ref 0.0–0.5)
Eosinophils Relative: 0 %
HCT: 40.2 % (ref 39.0–52.0)
Hemoglobin: 13.5 g/dL (ref 13.0–17.0)
Immature Granulocytes: 1 %
Lymphocytes Relative: 8 %
Lymphs Abs: 1 10*3/uL (ref 0.7–4.0)
MCH: 31.3 pg (ref 26.0–34.0)
MCHC: 33.6 g/dL (ref 30.0–36.0)
MCV: 93.3 fL (ref 80.0–100.0)
Monocytes Absolute: 0.4 10*3/uL (ref 0.1–1.0)
Monocytes Relative: 3 %
Neutro Abs: 11 10*3/uL — ABNORMAL HIGH (ref 1.7–7.7)
Neutrophils Relative %: 88 %
Platelets: 194 10*3/uL (ref 150–400)
RBC: 4.31 MIL/uL (ref 4.22–5.81)
RDW: 14.9 % (ref 11.5–15.5)
WBC: 12.6 10*3/uL — ABNORMAL HIGH (ref 4.0–10.5)
nRBC: 0 % (ref 0.0–0.2)

## 2022-06-16 LAB — BASIC METABOLIC PANEL
Anion gap: 7 (ref 5–15)
BUN: 39 mg/dL — ABNORMAL HIGH (ref 8–23)
CO2: 25 mmol/L (ref 22–32)
Calcium: 10.7 mg/dL — ABNORMAL HIGH (ref 8.9–10.3)
Chloride: 99 mmol/L (ref 98–111)
Creatinine, Ser: 1.57 mg/dL — ABNORMAL HIGH (ref 0.61–1.24)
GFR, Estimated: 46 mL/min — ABNORMAL LOW (ref >60)
Glucose, Bld: 174 mg/dL — ABNORMAL HIGH (ref 70–99)
Potassium: 4.7 mmol/L (ref 3.5–5.1)
Sodium: 131 mmol/L — ABNORMAL LOW (ref 135–145)

## 2022-06-16 LAB — OSMOLALITY, URINE: Osmolality, Ur: 407 mOsm/kg (ref 300–900)

## 2022-06-16 LAB — MYCOPLASMA PNEUMONIAE ANTIBODY, IGM: Mycoplasma pneumo IgM: <770 U/mL (ref 0–769)

## 2022-06-16 LAB — GLUCOSE, CAPILLARY
Glucose-Capillary: 154 mg/dL — ABNORMAL HIGH (ref 70–99)
Glucose-Capillary: 165 mg/dL — ABNORMAL HIGH (ref 70–99)
Glucose-Capillary: 182 mg/dL — ABNORMAL HIGH (ref 70–99)
Glucose-Capillary: 204 mg/dL — ABNORMAL HIGH (ref 70–99)
Glucose-Capillary: 207 mg/dL — ABNORMAL HIGH (ref 70–99)

## 2022-06-16 LAB — MAGNESIUM: Magnesium: 2.5 mg/dL — ABNORMAL HIGH (ref 1.7–2.4)

## 2022-06-16 LAB — PHOSPHORUS: Phosphorus: 2.5 mg/dL (ref 2.5–4.6)

## 2022-06-16 MED ORDER — METHYLPREDNISOLONE SODIUM SUCC 125 MG IJ SOLR
120.0000 mg | INTRAMUSCULAR | Status: DC
  Administered 2022-06-16: 80 mg via INTRAVENOUS
  Administered 2022-06-17: 120 mg via INTRAVENOUS
  Filled 2022-06-16 (×2): qty 2

## 2022-06-16 MED ORDER — ALUM & MAG HYDROXIDE-SIMETH 200-200-20 MG/5ML PO SUSP
30.0000 mL | ORAL | Status: DC | PRN
  Filled 2022-06-16: qty 30

## 2022-06-16 MED ORDER — LEVALBUTEROL HCL 0.63 MG/3ML IN NEBU
0.6300 mg | INHALATION_SOLUTION | Freq: Four times a day (QID) | RESPIRATORY_TRACT | Status: DC
  Administered 2022-06-17: 0.63 mg via RESPIRATORY_TRACT
  Filled 2022-06-16 (×4): qty 3

## 2022-06-16 MED ORDER — HYDROCOD POLI-CHLORPHE POLI ER 10-8 MG/5ML PO SUER
5.0000 mL | Freq: Two times a day (BID) | ORAL | Status: DC | PRN
  Administered 2022-06-16: 5 mL via ORAL
  Filled 2022-06-16: qty 5

## 2022-06-16 MED ORDER — SODIUM CHLORIDE 0.9 % IV SOLN: INTRAVENOUS | Status: DC

## 2022-06-16 MED ORDER — HYDROXYZINE HCL 25 MG PO TABS: 25.0000 mg | ORAL_TABLET | Freq: Three times a day (TID) | ORAL | Status: DC | PRN

## 2022-06-16 MED ORDER — IPRATROPIUM BROMIDE 0.02 % IN SOLN
0.5000 mg | Freq: Four times a day (QID) | RESPIRATORY_TRACT | Status: DC
  Administered 2022-06-16 – 2022-06-17 (×2): 0.5 mg via RESPIRATORY_TRACT
  Filled 2022-06-16 (×2): qty 2.5

## 2022-06-16 MED ORDER — SODIUM CHLORIDE 0.9 % IV SOLN: INTRAVENOUS | Status: AC

## 2022-06-16 NOTE — Progress Notes (Signed)
Patient declined resources

## 2022-06-16 NOTE — Discharge Instructions (Signed)

## 2022-06-16 NOTE — Progress Notes (Signed)
Nurse requested Mobility Specialist to perform oxygen saturation test with pt which includes removing pt from oxygen both at rest and while ambulating.  Below are the results from that testing.     Patient Saturations on Room Air at Rest = spO2 97%  Patient Saturations on Room Air while Ambulating = sp02 96% .  Rested and performed pursed lip breathing for 1 minute with sp02 at 97%.  Patient Saturations on 0 Liters of oxygen while Ambulating = sp02 96%  At end of testing pt left in room on 0  Liters of oxygen.  Reported results to nurse.   [place] Specialist Please contact via SecureChat or Rehab office at [phone]

## 2022-06-16 NOTE — TOC Initial Note (Addendum)
Transition of Care (TOC) - Initial/Assessment Note   Patient from home with wife.   Has CPAP at home, does not have home oxygen. If home oxygen needed will need ambulatory oxygen pulsation note and MD order.    [facility] .  Dr Odella Aquas - [facility] fax [phone] no assigned social worker . Update sent   Also sees Dr Kathryne Eriksson.    Patient Details  Name: Francis Cardenas MRN: [id number] Date of Birth: February 05, 1951  Transition of Care [facility]) CM/SW Contact:    Marilu Favre, RN Phone Number: 06/16/2022, 11:02 AM  Clinical Narrative:                   Expected Discharge Plan: Home/Self Care Barriers to Discharge: Continued Medical Work up   Patient Goals and CMS Choice Patient states their goals for this hospitalization and ongoing recovery are:: to return to home          Expected Discharge Plan and Services   Discharge Planning Services: CM Consult Post Acute Care Choice: NA Living arrangements for the past 2 months: Single Family Home                   DME Agency: NA       HH Arranged: NA          Prior Living Arrangements/Services Living arrangements for the past 2 months: Single Family Home Lives with:: Spouse Patient language and need for interpreter reviewed:: Yes Do you feel safe going back to the place where you live?: Yes      Need for Family Participation in Patient Care: Yes (Comment) Care giver support system in place?: Yes (comment) Current home services: DME Criminal Activity/Legal Involvement Pertinent to Current Situation/Hospitalization: No - Comment as needed  Activities of Daily Living Home Assistive Devices/Equipment: CPAP ADL Screening (condition at time of admission) Patient's cognitive ability adequate to safely complete daily activities?: Yes Is the patient deaf or have difficulty hearing?: Yes Does the patient have difficulty seeing, even when wearing glasses/contacts?: Yes Does the  patient have difficulty concentrating, remembering, or making decisions?: No Patient able to express need for assistance with ADLs?: Yes Does the patient have difficulty dressing or bathing?: Yes Independently performs ADLs?: Yes (appropriate for developmental age) Does the patient have difficulty walking or climbing stairs?: Yes Weakness of Legs: Both Weakness of Arms/Hands: Both  Permission Sought/Granted Permission sought to share information with : PCP Permission granted to share information with : No (PCP at [place] only)              Emotional Assessment Appearance:: Appears stated age Attitude/Demeanor/Rapport: Engaged Affect (typically observed): Accepting Orientation: : Oriented to Self, Oriented to Place, Oriented to  Time, Oriented to Situation Alcohol / Substance Use: Not Applicable Psych Involvement: No (comment)  Admission diagnosis:  COPD exacerbation ([place]) [J44.1] AKI (acute kidney injury) ([place]) [N17.9] COPD (chronic obstructive pulmonary disease) (HCC) [J44.9] Patient Active Problem List   Diagnosis Date Noted   AKI (acute kidney injury) ([place]) 06/15/2022   COPD exacerbation ([place]) 06/14/2022   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   PNA (pneumonia) 08/30/2021   Pressure injury of skin 05/15/2021   Prostate cancer ([place]) 05/14/2021   Persistent atrial fibrillation ([place]) 05/14/2021   Elevated troponin 04/06/2019   Community acquired pneumonia    Morbid obesity ([place]) 12/12/2018   (HFpEF) heart failure with preserved ejection fraction ([place]) 12/10/2018   Fall 12/10/2018  Multiple rib fractures 12/10/2018   OSA (obstructive sleep apnea) 12/10/2018   Hypokalemia 12/10/2018   Class 2 severe obesity due to excess calories with serious comorbidity and body mass index (BMI) of 37.0 to 37.9 in adult [facility])    Acute respiratory failure with hypoxia ([place]) 12/30/2017   Chest pain syndrome 12/30/2017   Carcinoma of prostate ([place]) 08/02/2016   COPD (chronic obstructive  pulmonary disease) ([place]) 08/02/2016   GERD (gastroesophageal reflux disease) 08/02/2016   Mixed hyperlipidemia 08/02/2016   Peripheral neuropathy 08/02/2016   Steatosis of liver 08/02/2016   Diabetes mellitus type 2 in obese ([place]) 08/02/2016   Benign essential hypertension 06/11/2013   Alcohol dependence ([place]) 03/26/2013   PTSD (post-traumatic stress disorder) 03/26/2013   ETOH abuse 03/24/2013   Depressive disorder 03/24/2013   PCP:  Gerome Sam, MD Pharmacy:   [place], [place] - [place] [address] [place] [address] Phone: [phone] Fax: [phone]  [facility] [place], [place] [address] [place] [address] [place] [address] Phone: [phone] Fax: [place] [address] Phone: [phone] Fax: [phone]     Social Determinants of Health (SDOH) Social History: SDOH Screenings   Food Insecurity: Unknown (06/15/2022)  Housing: Low Risk  (06/15/2022)  Transportation Needs: No Transportation Needs (06/15/2022)  Utilities: Not At Risk (06/15/2022)  Alcohol Screen: Medium Risk (01/04/2018)  Tobacco Use: Medium Risk (06/14/2022)   SDOH Interventions: Housing Interventions: Patient Refused   Readmission Risk Interventions    09/02/2021   11:32 AM  Readmission Risk Prevention Plan  Transportation Screening Complete  HRI or [place] Complete  Palliative Care Screening Not Applicable  Medication Review (RN Care Manager) Complete

## 2022-06-16 NOTE — Progress Notes (Signed)
PROGRESS NOTE    Francis Cardenas  LZJ:[id number] DOB: August 21, 1950 DOA: 06/14/2022 PCP: Gerome Sam, MD   Brief Narrative:  Patient is a 72 year old morbidly obese Caucasian male with a past medical history significant for melanoma to chronic heart failure with preserved ejection fraction, proximal atrial fibrillation on anticoagulation with Eliquis, COPD and asthma, prostate cancer, BPH, hypertension, hyperlipidemia, diabetes mellitus type 2, as well as other comorbidities who presents with wheezing and shortness of breath.  He reportedly has a history of asthma and COPD with no formal lung function test and recently the last 2 3 months started having wheezing, dry cough and he was treated with steroids and antibiotics with some success.  Over the last 3 to 4 weeks he started having some wheezing, productive cough with slight yellow sputum more in the morning and increased exertional dyspnea with no chest pain or fevers.  He has been using CPAP for his OSA at night and reports every evening and when he got off the CPAP midday started having wheezing and cough with copious amounts of yellow sputum.  Did not have a chance to go back to the [place] or visit his PCP so came to the ED for further evaluation.  In the ED is not found to be hypoxic but bloodwork some acute hyponatremia with elevated creatinine compared to his baseline.  He is given a breathing treatment and started improving.  He is currently being admitted for acute asthma and COPD exacerbation with failed outpatient management and has been started on doxycycline and steroids.  Will check an respiratory virus panel as well.   Assessment and Plan:  Acute asthma/COPD exacerbation -Failed outpatient management. -Given there is a change in color of sputum production, start patient on doxycycline for 5 days -LABA, ICS -IV Solu-Medrol x 1 and then continue IV Solu-Medrol and then changed to prednisone at discharge -Refer to ambulatory  pulmonary for lung function test. -Incentive spirometry -Atypical infection study has been placed on doxycycline 100 g every 12 -Continue with Dulera 2 puffs IH twice daily as well as budesonide 2 mg nebs every 12 -Repeat chest x-ray in the a.m. -Continue with Xopenex and Atrovent every 6 scheduled and DuoNebs as needed -Continue guaifenesin, flutter valve and incentive spirometry -Check respiratory virus panel and placed on droplet precautions for now   AKI -Check UA --BUN/Cr Trend: Recent Labs  Lab 06/14/22 2004 06/15/22 1433 06/16/22 0016  BUN 47* 36* 39*  CREATININE 1.71* 1.41* 1.57*  -Clinically appears to be euvolemic, mild related to chronic NSAID use for chronic back pain and diabetic neuropathy -Hold off diuresis for today -Check renal ultrasound, check uric acid level -Hold off spironolactone -Hold off NSAIDs -Getting IVF Hydration with NS at 75 mL/hr -Avoid Nephrotoxic Medications, Contrast Dyes, Hypotension and Dehydration to Ensure Adequate Renal Perfusion and will need to Renally Adjust Meds -Continue to Monitor and Trend Renal Function carefully and repeat CMP in the AM   HTN, refractory -BP fairly controlled -Continue amlodipine, metoprolol -Hold off torsemide and spironolactone -To monitor blood pressures per protocol   IIDM -Sliding scale for now -Hemoglobin A1c is 5.9 -Resume p.o. diabetic medication after kidney function stabilized. -Continue monitor CBGs per protocol  Paroxysmal atrial fibrillation -Continue with anticoagulation with apixaban 5 mg p.o. twice daily and metoprolol succinate 12.5 mg p.o. daily   Hyponatremia -Euvolemic, might related to alcohol abuse and AKI -Na+ Trend: Recent Labs  Lab 06/14/22 2004 06/15/22 1433 06/16/22 0016  NA 127* 134* 131*  -Fluid restriction -  Check urine sodium and osmolality -Monitor and trend and repeat CMP in the a.m.   EtOH abuse -No symptoms signs of active withdrawal -CIWA protocol with as  needed benzos   PAF -Rate controlled -Continue Eliquis   HFpEF -Clinically appears to be euvolemic, torsemide on hold due to AKI -IV fluid hydration as above -Outpatient follow-up with cardiology   OSA -CPAP HS  GERD/GI prophylaxis -Continue with pantoprazole 40 mg p.o. daily  Morbid Obesity -Complicates overall prognosis and care -Estimated body mass index is 41.61 kg/m as calculated from the following:   Height as of this encounter: '5\' 10"'$  (1.778 m).   Weight as of this encounter: 131.5 kg.  -Weight Loss and Dietary Counseling given   DVT prophylaxis:  apixaban (ELIQUIS) tablet 5 mg    Code Status: Full Code Family Communication: No family currently at bedside  Disposition Plan:  Level of care: Telemetry Medical Status is: Inpatient Remains inpatient appropriate because: He continues to have some dyspnea and some wheezing but is slowly improving.  Anticipate discharge in next 24 to 48 hours   Consultants:  None  Procedures:  As delineated as above  Antimicrobials:  Anti-infectives (From admission, onward)    Start     Dose/Rate Route Frequency Ordered Stop   06/15/22 1400  doxycycline (VIBRA-TABS) tablet 100 mg        100 mg Oral Every 12 hours 06/15/22 1302 06/20/22 0959       Subjective: Seen and examined at bedside and patient was followed bit better but still little dyspneic and has some wheezing.  No nausea or vomiting.  Had a little bit of chest discomfort and related to GERD given it is on his right side.  No lightheadedness or dizziness.  No other concerns or complaints at this time.  Objective: Vitals:   06/16/22 0818 06/16/22 0855 06/16/22 0919 06/16/22 1500  BP:  (!) 122/57 121/61   Pulse:  79 72   Resp:      Temp:      TempSrc:      SpO2: 95%  97% 98%  Weight:      Height:        Intake/Output Summary (Last 24 hours) at 06/16/2022 1924 Last data filed at 06/16/2022 1900 Gross per 24 hour  Intake 1275.68 ml  Output 1100 ml  Net 175.68  ml   Filed Weights   06/14/22 1812  Weight: 131.5 kg   Examination: Physical Exam:  Constitutional: WN/WD morbidly obese Caucasian male in no acute distress Respiratory: Diminished to auscultation bilaterally with some coarse breath sounds and some wheezing and some slight rhonchi.  No appreciable rales or crackles. Normal respiratory effort and patient is not tachypenic. No accessory muscle use.  Not wearing supplemental oxygen via nasal cannula Cardiovascular: RRR, no murmurs / rubs / gallops. S1 and S2 auscultated.  Minimal extremity edema Abdomen: Soft, non-tender, distended secondary to body habitus.  Bowel sounds positive.  GU: Deferred. Musculoskeletal: No clubbing / cyanosis of digits/nails. No joint deformity upper and lower extremities.  Skin: No rashes, lesions, ulcers on limited skin evaluation. No induration; Warm and dry.  Neurologic: CN 2-12 grossly intact with no focal deficits. Romberg sign and cerebellar reflexes not assessed.  Psychiatric: Normal judgment and insight. Alert and oriented x 3. Normal mood and appropriate affect.   Data Reviewed: I have personally reviewed following labs and imaging studies  CBC: Recent Labs  Lab 06/14/22 2004 06/16/22 1017  WBC 7.6 12.6*  NEUTROABS 5.4 11.0*  HGB 13.6 13.5  HCT 40.6 40.2  MCV 93.1 93.3  PLT 173 599   Basic Metabolic Panel: Recent Labs  Lab 06/14/22 2004 06/15/22 1433 06/16/22 0016 06/16/22 1017  NA 127* 134* 131*  --   K 5.1 4.0 4.7  --   CL 92* 98 99  --   CO2 '26 25 25  '$ --   GLUCOSE 119* 112* 174*  --   BUN 47* 36* 39*  --   CREATININE 1.71* 1.41* 1.57*  --   CALCIUM 11.4* 11.2* 10.7*  --   MG  --   --   --  2.5*  PHOS  --   --   --  2.5   GFR: Estimated Creatinine Clearance: 57.1 mL/min (A) (by C-G formula based on SCr of 1.57 mg/dL (H)). Liver Function Tests: Recent Labs  Lab 06/14/22 2004  AST 17  ALT 22  ALKPHOS 60  BILITOT 0.9  PROT 7.4  ALBUMIN 4.6   No results for input(s):  "LIPASE", "AMYLASE" in the last 168 hours. No results for input(s): "AMMONIA" in the last 168 hours. Coagulation Profile: No results for input(s): "INR", "PROTIME" in the last 168 hours. Cardiac Enzymes: No results for input(s): "CKTOTAL", "CKMB", "CKMBINDEX", "TROPONINI" in the last 168 hours. BNP (last 3 results) No results for input(s): "PROBNP" in the last 8760 hours. HbA1C: Recent Labs    06/15/22 1433  HGBA1C 5.9*   CBG: Recent Labs  Lab 06/15/22 1936 06/16/22 0618 06/16/22 0916 06/16/22 1225 06/16/22 1627  GLUCAP 207* 154* 165* 182* 204*   Lipid Profile: No results for input(s): "CHOL", "HDL", "LDLCALC", "TRIG", "CHOLHDL", "LDLDIRECT" in the last 72 hours. Thyroid Function Tests: No results for input(s): "TSH", "T4TOTAL", "FREET4", "T3FREE", "THYROIDAB" in the last 72 hours. Anemia Panel: No results for input(s): "VITAMINB12", "FOLATE", "FERRITIN", "TIBC", "IRON", "RETICCTPCT" in the last 72 hours. Sepsis Labs: No results for input(s): "PROCALCITON", "LATICACIDVEN" in the last 168 hours.  Recent Results (from the past 240 hour(s))  Resp panel by RT-PCR (RSV, Flu A&B, Covid) Anterior Nasal Swab     Status: None   Collection Time: 06/14/22  8:04 PM   Specimen: Anterior Nasal Swab  Result Value Ref Range Status   SARS Coronavirus 2 by RT PCR NEGATIVE NEGATIVE Final    Comment: (NOTE) SARS-CoV-2 target nucleic acids are NOT DETECTED.  The SARS-CoV-2 RNA is generally detectable in upper respiratory specimens during the acute phase of infection. The lowest concentration of SARS-CoV-2 viral copies this assay can detect is 138 copies/mL. A negative result does not preclude SARS-Cov-2 infection and should not be used as the sole basis for treatment or other patient management decisions. A negative result may occur with  improper specimen collection/handling, submission of specimen other than nasopharyngeal swab, presence of viral mutation(s) within the areas targeted  by this assay, and inadequate number of viral copies(<138 copies/mL). A negative result must be combined with clinical observations, patient history, and epidemiological information. The expected result is Negative.  Fact Sheet for Patients:  EntrepreneurPulse.com.au  Fact Sheet for Healthcare Providers:  IncredibleEmployment.be  This test is no t yet approved or cleared by the [place] FDA and  has been authorized for detection and/or diagnosis of SARS-CoV-2 by FDA under an Emergency Use Authorization (EUA). This EUA will remain  in effect (meaning this test can be used) for the duration of the COVID-19 declaration under Section 564(b)(1) of the Act, 21 U.S.C.section 360bbb-3(b)(1), unless the authorization is terminated  or revoked sooner.  Influenza A by PCR NEGATIVE NEGATIVE Final   Influenza B by PCR NEGATIVE NEGATIVE Final    Comment: (NOTE) The Xpert Xpress SARS-CoV-2/FLU/RSV plus assay is intended as an aid in the diagnosis of influenza from Nasopharyngeal swab specimens and should not be used as a sole basis for treatment. Nasal washings and aspirates are unacceptable for Xpert Xpress SARS-CoV-2/FLU/RSV testing.  Fact Sheet for Patients: EntrepreneurPulse.com.au  Fact Sheet for Healthcare Providers: IncredibleEmployment.be  This test is not yet approved or cleared by the [place] FDA and has been authorized for detection and/or diagnosis of SARS-CoV-2 by FDA under an Emergency Use Authorization (EUA). This EUA will remain in effect (meaning this test can be used) for the duration of the COVID-19 declaration under Section 564(b)(1) of the Act, 21 U.S.C. section 360bbb-3(b)(1), unless the authorization is terminated or revoked.     Resp Syncytial Virus by PCR NEGATIVE NEGATIVE Final    Comment: (NOTE) Fact Sheet for Patients: EntrepreneurPulse.com.au  Fact  Sheet for Healthcare Providers: IncredibleEmployment.be  This test is not yet approved or cleared by the [place] FDA and has been authorized for detection and/or diagnosis of SARS-CoV-2 by FDA under an Emergency Use Authorization (EUA). This EUA will remain in effect (meaning this test can be used) for the duration of the COVID-19 declaration under Section 564(b)(1) of the Act, 21 U.S.C. section 360bbb-3(b)(1), unless the authorization is terminated or revoked.  Performed at [facility], [address], [place], [place] [address]     Radiology Studies: US RENAL  Result Date: 06/15/2022 CLINICAL DATA:  Initial evaluation for acute kidney injury. EXAM: RENAL / URINARY TRACT ULTRASOUND COMPLETE COMPARISON:  None Available. FINDINGS: Right Kidney: Renal measurements: 11.4 x 6.3 x 4.7 cm = volume: 175.7 mL. Renal echogenicity within normal limits. No nephrolithiasis or hydronephrosis. 1.4 x 1.0 x 1.1 cm simple cyst present at the lower pole. Left Kidney: Renal measurements: 12.9 x 6.7 x 5.2 cm = volume: 235.8 mL. Renal echogenicity within normal limits. No nephrolithiasis or hydronephrosis. 1.4 x 1.1 x 0.9 cm simple exophytic cyst seen at the interpolar region. Bladder: Appears normal for degree of bladder distention. Other: None. IMPRESSION: 1. No hydronephrosis or other acute abnormality. 2. Few small simple bilateral renal cysts measuring up to 1.4 cm as above. Electronically Signed   By: Jeannine Boga M.D.   On: 06/15/2022 19:29   DG Chest Port 1 View  Result Date: 06/14/2022 CLINICAL DATA:  Shortness of breath EXAM: PORTABLE CHEST 1 VIEW COMPARISON:  04/05/2022 FINDINGS: The heart size and mediastinal contours are within normal limits. Both lungs are clear. The visualized skeletal structures are unremarkable. IMPRESSION: No active disease. Electronically Signed   By: Donavan Foil M.D.   On: 06/14/2022 19:54    Scheduled Meds:  allopurinol   400 mg Oral Daily   amLODipine  10 mg Oral Daily   apixaban  5 mg Oral BID   atorvastatin  40 mg Oral Q supper   benzonatate  200 mg Oral BID   budesonide (PULMICORT) nebulizer solution  2 mg Nebulization Q12H   doxycycline  100 mg Oral K53Z   folic acid  1 mg Oral Daily   gabapentin  100 mg Oral TID   guaiFENesin  1,200 mg Oral BID   hydrALAZINE  25 mg Oral Daily   insulin aspart  0-15 Units Subcutaneous TID WC   ipratropium-albuterol  3 mL Nebulization Q6H   magnesium oxide  400 mg Oral Q supper   methylPREDNISolone (  SOLU-MEDROL) injection  120 mg Intravenous Q24H   metoprolol succinate  12.5 mg Oral Daily   mometasone-formoterol  2 puff Inhalation BID   multivitamin with minerals  1 tablet Oral Daily   pantoprazole  40 mg Oral Daily   thiamine  100 mg Oral Daily   Or   thiamine  100 mg Intravenous Daily   vitamin B-12  500 mcg Oral Q supper   Continuous Infusions:  sodium chloride 75 mL/hr at 06/16/22 1254    LOS: 1 day   Francis Noble, DO [facility] Available via Epic secure chat 7am-7pm After these hours, please refer to coverage provider listed on [email or web address] 06/16/2022, 7:24 PM

## 2022-06-16 NOTE — Progress Notes (Signed)
Nurse requested Mobility Specialist to perform oxygen saturation test with pt which includes removing pt from oxygen both at rest and while ambulating.  Below are the results from that testing.     Patient Saturations on Room Air at Rest = spO2 98%  Patient Saturations on Room Air while Ambulating = sp02 98% .  Rested and performed pursed lip breathing for 1 minute with sp02 at 99%.  Patient Saturations on 0 Liters of oxygen while Ambulating = sp02 98%  At end of testing pt left in room on 0  Liters of oxygen.  Reported results to nurse.   [place] Specialist Please contact via SecureChat or Rehab office at [phone]

## 2022-06-16 NOTE — TOC CAGE-AID Note (Signed)
Transition of Care [facility]) - CAGE-AID Screening   Patient Details  Name: Francis Cardenas MRN: [id number] Date of Birth: [birth year]  [facility]) CM/SW Contact:    Marilu Favre, RN Phone Number: 06/16/2022, 11:06 AM   Clinical Narrative:    CAGE-AID Screening:    Have You Ever Felt You Ought to Cut Down on Your Drinking or Drug Use?: Yes Have People Annoyed You By [employer] Your Drinking Or Drug Use?: Yes Have You Felt Bad Or Guilty About Your Drinking Or Drug Use?: No Have You Ever Had a Drink or Used Drugs First Thing In The Morning to Steady Your Nerves or to Get Rid of a Hangover?: Yes CAGE-AID Score: 3  Substance Abuse Education Offered: Yes     Patient declined resources, has gone to AA in past

## 2022-06-16 NOTE — Progress Notes (Signed)
Mobility Specialist - Progress Note   06/16/22 1600  Mobility  Activity Ambulated independently in hallway  Level of Assistance Independent  Assistive Device None  Distance Ambulated (ft) 550 ft  Activity Response Tolerated well  $Mobility charge 1 Mobility    Pt received in recliner agreeable to mobility. No complaints throughout, tolerated well on room air. Left in recliner w/ call bell in reach.   Pre-mobility: SpO2 97% During mobility: SpO2 96% Post-mobility: SpO2 98%  [place] Specialist Please contact via [employer] or Rehab office at [phone]

## 2022-06-17 ENCOUNTER — Inpatient Hospital Stay (HOSPITAL_COMMUNITY): Payer: No Typology Code available for payment source

## 2022-06-17 DIAGNOSIS — J441 Chronic obstructive pulmonary disease with (acute) exacerbation: Secondary | ICD-10-CM | POA: Diagnosis not present

## 2022-06-17 DIAGNOSIS — N179 Acute kidney failure, unspecified: Secondary | ICD-10-CM | POA: Diagnosis not present

## 2022-06-17 DIAGNOSIS — I5032 Chronic diastolic (congestive) heart failure: Secondary | ICD-10-CM | POA: Diagnosis not present

## 2022-06-17 LAB — CBC WITH DIFFERENTIAL/PLATELET
Abs Immature Granulocytes: 0.09 10*3/uL — ABNORMAL HIGH (ref 0.00–0.07)
Basophils Absolute: 0 10*3/uL (ref 0.0–0.1)
Basophils Relative: 0 %
Eosinophils Absolute: 0 10*3/uL (ref 0.0–0.5)
Eosinophils Relative: 0 %
HCT: 41.5 % (ref 39.0–52.0)
Hemoglobin: 13.6 g/dL (ref 13.0–17.0)
Immature Granulocytes: 1 %
Lymphocytes Relative: 9 %
Lymphs Abs: 1.1 10*3/uL (ref 0.7–4.0)
MCH: 31.1 pg (ref 26.0–34.0)
MCHC: 32.8 g/dL (ref 30.0–36.0)
MCV: 94.7 fL (ref 80.0–100.0)
Monocytes Absolute: 0.3 10*3/uL (ref 0.1–1.0)
Monocytes Relative: 3 %
Neutro Abs: 11 10*3/uL — ABNORMAL HIGH (ref 1.7–7.7)
Neutrophils Relative %: 87 %
Platelets: 209 10*3/uL (ref 150–400)
RBC: 4.38 MIL/uL (ref 4.22–5.81)
RDW: 14.8 % (ref 11.5–15.5)
WBC: 12.5 10*3/uL — ABNORMAL HIGH (ref 4.0–10.5)
nRBC: 0 % (ref 0.0–0.2)

## 2022-06-17 LAB — RESPIRATORY PANEL BY PCR

## 2022-06-17 LAB — COMPREHENSIVE METABOLIC PANEL
ALT: 22 U/L (ref 0–44)
AST: 30 U/L (ref 15–41)
Albumin: 4.3 g/dL (ref 3.5–5.0)
Alkaline Phosphatase: 67 U/L (ref 38–126)
Anion gap: 8 (ref 5–15)
BUN: 38 mg/dL — ABNORMAL HIGH (ref 8–23)
CO2: 22 mmol/L (ref 22–32)
Calcium: 11.3 mg/dL — ABNORMAL HIGH (ref 8.9–10.3)
Chloride: 100 mmol/L (ref 98–111)
Creatinine, Ser: 1.32 mg/dL — ABNORMAL HIGH (ref 0.61–1.24)
GFR, Estimated: 57 mL/min — ABNORMAL LOW (ref >60)
Glucose, Bld: 148 mg/dL — ABNORMAL HIGH (ref 70–99)
Potassium: 4.9 mmol/L (ref 3.5–5.1)
Sodium: 130 mmol/L — ABNORMAL LOW (ref 135–145)
Total Bilirubin: 0.8 mg/dL (ref 0.3–1.2)
Total Protein: 7.3 g/dL (ref 6.5–8.1)

## 2022-06-17 LAB — GLUCOSE, CAPILLARY
Glucose-Capillary: 142 mg/dL — ABNORMAL HIGH (ref 70–99)
Glucose-Capillary: 134 mg/dL — ABNORMAL HIGH (ref 70–99)
Glucose-Capillary: 119 mg/dL — ABNORMAL HIGH (ref 70–99)

## 2022-06-17 LAB — MAGNESIUM: Magnesium: 2.7 mg/dL — ABNORMAL HIGH (ref 1.7–2.4)

## 2022-06-17 LAB — LEGIONELLA PNEUMOPHILA SEROGP 1 UR AG: L. pneumophila Serogp 1 Ur Ag: NEGATIVE

## 2022-06-17 LAB — PHOSPHORUS: Phosphorus: 2.9 mg/dL (ref 2.5–4.6)

## 2022-06-17 MED ORDER — PREDNISONE 10 MG (21) PO TBPK: ORAL_TABLET | ORAL | 0 refills | Status: DC

## 2022-06-17 MED ORDER — BENZONATATE 200 MG PO CAPS: 200.0000 mg | ORAL_CAPSULE | Freq: Two times a day (BID) | ORAL | 0 refills | Status: DC

## 2022-06-17 MED ORDER — HYDROCOD POLI-CHLORPHE POLI ER 10-8 MG/5ML PO SUER: 5.0000 mL | Freq: Two times a day (BID) | ORAL | 0 refills | Status: DC | PRN

## 2022-06-17 MED ORDER — DOXYCYCLINE HYCLATE 100 MG PO TABS: 100.0000 mg | ORAL_TABLET | Freq: Two times a day (BID) | ORAL | 0 refills | Status: AC

## 2022-06-17 MED ORDER — GUAIFENESIN ER 600 MG PO TB12: 600.0000 mg | ORAL_TABLET | Freq: Two times a day (BID) | ORAL | 0 refills | Status: AC

## 2022-06-17 MED ORDER — FOLIC ACID 1 MG PO TABS: 1.0000 mg | ORAL_TABLET | Freq: Every day | ORAL | 0 refills | Status: AC

## 2022-06-17 NOTE — Progress Notes (Signed)
Pt discharged to home accomp by wife. All DC instructions reviewed with pt and copy given. All follow up appts reviewed and where to pick up Rxs. No questions verbalized about home self care.

## 2022-06-17 NOTE — Discharge Summary (Signed)
Physician Discharge Summary   Patient: Francis Cardenas MRN: [id number] DOB: 11-Jul-1950  Admit date:     06/14/2022  Discharge date: 06/17/22  Discharge Physician: Raiford Noble, DO   PCP: Gerome Sam, MD   Recommendations at discharge:   Follow-up with PCP within 1 to 2 weeks repeat CBC, CMP, mag, Phos within 1 week Follow-up with pulmonary in the outpatient setting an appointment has been is scheduled with Geraldo Pitter, nurse practitioner on 06/28/2022 at 9:30 AM Repeat chest x-ray in 3 to 6 weeks  Discharge Diagnoses: Principal Problem:   COPD exacerbation ([place]) Active Problems:   Alcohol dependence ([place])   COPD (chronic obstructive pulmonary disease) ([place])   Class 2 severe obesity due to excess calories with serious comorbidity and body mass index (BMI) of 37.0 to 37.9 in adult ([place])   (HFpEF) heart failure with preserved ejection fraction ([place])   AKI (acute kidney injury) ([place])  Resolved Problems:   * No resolved hospital problems. *  Hospital Course: Patient is a 72 year old morbidly obese Caucasian male with a past medical history significant for melanoma to chronic heart failure with preserved ejection fraction, proximal atrial fibrillation on anticoagulation with Eliquis, COPD and asthma, prostate cancer, BPH, hypertension, hyperlipidemia, diabetes mellitus type 2, as well as other comorbidities who presents with wheezing and shortness of breath.  He reportedly has a history of asthma and COPD with no formal lung function test and recently the last 2 3 months started having wheezing, dry cough and he was treated with steroids and antibiotics with some success.  Over the last 3 to 4 weeks he started having some wheezing, productive cough with slight yellow sputum more in the morning and increased exertional dyspnea with no chest pain or fevers.  He has been using CPAP for his OSA at night and reports every evening and when he got off the CPAP midday started having  wheezing and cough with copious amounts of yellow sputum.  Did not have a chance to go back to the [place] or visit his PCP so came to the ED for further evaluation.  In the ED is not found to be hypoxic but bloodwork some acute hyponatremia with elevated creatinine compared to his baseline.  He is given a breathing treatment and started improving.  He is currently being admitted for acute asthma and COPD exacerbation with failed outpatient management and has been started on doxycycline and steroids.  Will check an respiratory virus panel as well.    Assessment and Plan:  Acute asthma/COPD exacerbation -Failed outpatient management. -Given there is a change in color of sputum production, start patient on doxycycline for 5 days -LABA, ICS -IV Solu-Medrol x 1 and then continue IV Solu-Medrol and then changed to prednisone at discharge -Refer to ambulatory pulmonary for lung function test. -Incentive spirometry -Atypical infection study has been placed on doxycycline 100 g every 12 -Continue with Dulera 2 puffs IH twice daily as well as budesonide 2 mg nebs every 12 -Repeat chest x-ray in the a.m. -Continue with Xopenex and Atrovent every 6 scheduled and DuoNebs as needed -Continue guaifenesin, flutter valve and incentive spirometry -Check respiratory virus panel and placed on droplet precautions for now   AKI -Check UA --BUN/Cr Trend: Last Labs       Recent Labs  Lab 06/14/22 2004 06/15/22 1433 06/16/22 0016  BUN 47* 36* 39*  CREATININE 1.71* 1.41* 1.57*    -Clinically appears to be euvolemic, mild related to chronic NSAID use for chronic back pain  and diabetic neuropathy -Hold off diuresis for today -Check renal ultrasound, check uric acid level -Hold off spironolactone -Hold off NSAIDs -Getting IVF Hydration with NS at 75 mL/hr -Avoid Nephrotoxic Medications, Contrast Dyes, Hypotension and Dehydration to Ensure Adequate Renal Perfusion and will need to Renally Adjust Meds -Continue  to Monitor and Trend Renal Function carefully and repeat CMP in the AM    HTN, refractory -BP fairly controlled -Continue amlodipine, metoprolol -Hold off torsemide and spironolactone -To monitor blood pressures per protocol   IIDM -Sliding scale for now -Hemoglobin A1c is 5.9 -Resume p.o. diabetic medication after kidney function stabilized. -Continue monitor CBGs per protocol   Paroxysmal atrial fibrillation -Continue with anticoagulation with apixaban 5 mg p.o. twice daily and metoprolol succinate 12.5 mg p.o. daily   Hyponatremia -Euvolemic, might related to alcohol abuse and AKI -Na+ Trend: Last Labs       Recent Labs  Lab 06/14/22 2004 06/15/22 1433 06/16/22 0016  NA 127* 134* 131*    -Fluid restriction -Check urine sodium and osmolality -Monitor and trend and repeat CMP in the a.m.   EtOH abuse -No symptoms signs of active withdrawal -CIWA protocol with as needed benzos   PAF -Rate controlled -Continue Eliquis   HFpEF -Clinically appears to be euvolemic, torsemide on hold due to AKI -IV fluid hydration as above -Outpatient follow-up with cardiology   OSA -CPAP HS   GERD/GI prophylaxis -Continue with pantoprazole 40 mg p.o. daily   Morbid Obesity -Complicates overall prognosis and care -Estimated body mass index is 41.61 kg/m as calculated from the following:   Height as of this encounter: '5\' 10"'$  (1.778 m).   Weight as of this encounter: 131.5 kg.  -Weight Loss and Dietary Counseling given    Active Pressure Injury/Wound(s)     Pressure Ulcer  Duration          Pressure Injury 05/15/21 Buttocks Left Stage 2 -  Partial thickness loss of dermis presenting as a shallow open injury with a red, pink wound bed without slough. bruising around the site vs. deep tissue injury 398 days           (Optional):26781}  {(NOTE) Pain control PDMP Statment (Optional):26782} Consultants: *** Procedures performed: ***  Disposition: {Plan;  Disposition:26390} Diet recommendation:  Discharge Diet Orders (From admission, onward)     Start     Ordered   06/17/22 0000  Diet - low sodium heart healthy        06/17/22 1210           {Diet_Plan:26776} DISCHARGE MEDICATION: Allergies as of 06/17/2022       Reactions   Oxcarbazepine Other (See Comments)   Dizziness/ lightheaded   Zolpidem Nausea Only        Medication List     STOP taking these medications    amoxicillin-clavulanate 875-125 MG tablet Commonly known as: AUGMENTIN   azithromycin 250 MG tablet Commonly known as: ZITHROMAX   colchicine 0.6 MG tablet   diclofenac Sodium 1 % Gel Commonly known as: VOLTAREN   Ensure Max Protein Liqd   guaifenesin 400 MG Tabs tablet Commonly known as: HUMIBID E Replaced by: guaiFENesin 600 MG 12 hr tablet   meloxicam 15 MG tablet Commonly known as: MOBIC   mometasone-formoterol 100-5 MCG/ACT Aero Commonly known as: DULERA       TAKE these medications    acetaminophen 325 MG tablet Commonly known as: Tylenol Take 2 tablets (650 mg total) by mouth every 6 (six) hours as needed.  albuterol 108 (90 Base) MCG/ACT inhaler Commonly known as: VENTOLIN HFA Inhale 2 puffs into the lungs every 6 (six) hours as needed for wheezing.   allopurinol 300 MG tablet Commonly known as: ZYLOPRIM Take 1 tablet by mouth daily.   allopurinol 100 MG tablet Commonly known as: ZYLOPRIM Take 100 mg by mouth daily.   ALPRAZolam 1 MG tablet Commonly known as: XANAX Take 1 mg by mouth at bedtime.   amLODipine 10 MG tablet Commonly known as: NORVASC Take 10 mg by mouth daily.   apixaban 5 MG Tabs tablet Commonly known as: ELIQUIS Take 1 tablet (5 mg total) by mouth 2 (two) times daily.   atorvastatin 80 MG tablet Commonly known as: LIPITOR Take 40 mg by mouth daily with supper.   benzonatate 200 MG capsule Commonly known as: TESSALON Take 1 capsule (200 mg total) by mouth 2 (two) times daily.    chlorpheniramine-HYDROcodone 10-8 MG/5ML Commonly known as: TUSSIONEX Take 5 mLs by mouth every 12 (twelve) hours as needed for cough.   doxycycline 100 MG tablet Commonly known as: VIBRA-TABS Take 1 tablet (100 mg total) by mouth every 12 (twelve) hours for 4 days.   folic acid 1 MG tablet Commonly known as: FOLVITE Take 1 tablet (1 mg total) by mouth daily for 4 days. Start taking on: June 18, 2022   gabapentin 300 MG capsule Commonly known as: NEURONTIN Take 300 mg by mouth 3 (three) times daily.   guaiFENesin 600 MG 12 hr tablet Commonly known as: MUCINEX Take 1 tablet (600 mg total) by mouth 2 (two) times daily for 5 days. Replaces: guaifenesin 400 MG Tabs tablet   hydrALAZINE 25 MG tablet Commonly known as: APRESOLINE Take 25 mg by mouth daily.   Jardiance 25 MG Tabs tablet Generic drug: empagliflozin Take 12.5 mg by mouth 2 (two) times daily.   Magnesium Oxide 420 MG Tabs Take 420 mg by mouth daily with supper.   metoprolol succinate 25 MG 24 hr tablet Commonly known as: TOPROL-XL Take 0.5 tablets (12.5 mg total) by mouth daily.   multivitamin with minerals tablet Take 1 tablet by mouth every Monday, Wednesday, and Friday.   omeprazole 20 MG capsule Commonly known as: PRILOSEC Take 20 mg by mouth daily with supper.   oxyCODONE-acetaminophen 5-325 MG tablet Commonly known as: PERCOCET/ROXICET Take 1 tablet by mouth every 6 (six) hours as needed for severe pain.   potassium chloride SA 20 MEQ tablet Commonly known as: KLOR-CON M Take 1 tablet (20 mEq total) by mouth daily with supper.   predniSONE 10 MG (21) Tbpk tablet Commonly known as: STERAPRED UNI-PAK 21 TAB Take 6 pills on Day 1, 5 pills on Day 2, 4 pills on Day 3, 3 pills on Day 4, 2 pills on Day 5, 1 pill on Day 6 and then Stop   spironolactone 25 MG tablet Commonly known as: ALDACTONE Take 25 mg by mouth every morning.   thiamine 100 MG tablet Commonly known as: VITAMIN B1 Take 1 tablet  (100 mg total) by mouth daily. What changed: additional instructions   Tiotropium Bromide-Olodaterol 2.5-2.5 MCG/ACT Aers Inhale 2 each into the lungs daily.   torsemide 20 MG tablet Commonly known as: DEMADEX Take 20 mg by mouth 2 (two) times daily.   vitamin B-12 500 MCG tablet Commonly known as: CYANOCOBALAMIN Take 500 mcg by mouth daily with supper.               Discharge Care Instructions  (From admission, onward)  Start     Ordered   06/17/22 0000  Discharge wound care:       Comments: Frequent Turning   06/17/22 1210            Follow-up Information     Margaretha Seeds, MD Follow up in 1 week(s).   Specialty: Pulmonary Disease Contact information: [address] [place] [address] [phone]         Gerome Sam, MD. Call.   Specialty: Internal Medicine Why: Follow up within 1 week Contact information: [place] [address] [phone]         Martyn Ehrich, NP. Go on 06/28/2022.   Specialty: Pulmonary Disease Why: Appointment set up for 06/28/22 for 9:30 AM Contact information: [place] [address] [place] [address] [phone]                Discharge Exam: Danley Danker Weights   06/14/22 1812  Weight: 131.5 kg   ***  Condition at discharge: {DC Condition:26389}  The results of significant diagnostics from this hospitalization (including imaging, microbiology, ancillary and laboratory) are listed below for reference.   Imaging Studies: DG CHEST PORT 1 VIEW  Result Date: 06/17/2022 CLINICAL DATA:  Shortness of breath.  History of COPD. EXAM: PORTABLE CHEST 1 VIEW COMPARISON:  Radiographs 06/14/2022 and 04/05/2022.  CT 12/10/2018. FINDINGS: 0553 hours. Slightly less lordotic positioning. The heart size and mediastinal contours are stable. The lungs are clear. There is no pleural effusion or pneumothorax. No acute osseous findings are identified. Telemetry  leads overlie the chest. IMPRESSION: Stable chest.  No evidence of active cardiopulmonary process. Electronically Signed   By: Richardean Sale M.D.   On: 06/17/2022 08:17   US RENAL  Result Date: 06/15/2022 CLINICAL DATA:  Initial evaluation for acute kidney injury. EXAM: RENAL / URINARY TRACT ULTRASOUND COMPLETE COMPARISON:  None Available. FINDINGS: Right Kidney: Renal measurements: 11.4 x 6.3 x 4.7 cm = volume: 175.7 mL. Renal echogenicity within normal limits. No nephrolithiasis or hydronephrosis. 1.4 x 1.0 x 1.1 cm simple cyst present at the lower pole. Left Kidney: Renal measurements: 12.9 x 6.7 x 5.2 cm = volume: 235.8 mL. Renal echogenicity within normal limits. No nephrolithiasis or hydronephrosis. 1.4 x 1.1 x 0.9 cm simple exophytic cyst seen at the interpolar region. Bladder: Appears normal for degree of bladder distention. Other: None. IMPRESSION: 1. No hydronephrosis or other acute abnormality. 2. Few small simple bilateral renal cysts measuring up to 1.4 cm as above. Electronically Signed   By: Jeannine Boga M.D.   On: 06/15/2022 19:29   DG Chest Port 1 View  Result Date: 06/14/2022 CLINICAL DATA:  Shortness of breath EXAM: PORTABLE CHEST 1 VIEW COMPARISON:  04/05/2022 FINDINGS: The heart size and mediastinal contours are within normal limits. Both lungs are clear. The visualized skeletal structures are unremarkable. IMPRESSION: No active disease. Electronically Signed   By: Donavan Foil M.D.   On: 06/14/2022 19:54    Microbiology: Results for orders placed or performed during the hospital encounter of 06/14/22  Resp panel by RT-PCR (RSV, Flu A&B, Covid) Anterior Nasal Swab     Status: None   Collection Time: 06/14/22  8:04 PM   Specimen: Anterior Nasal Swab  Result Value Ref Range Status   SARS Coronavirus 2 by RT PCR NEGATIVE NEGATIVE Final    Comment: (NOTE) SARS-CoV-2 target nucleic acids are NOT DETECTED.  The SARS-CoV-2 RNA is generally detectable in upper  respiratory  specimens during the acute phase of infection. The lowest concentration of SARS-CoV-2 viral copies this assay can detect is 138 copies/mL. A negative result does not preclude SARS-Cov-2 infection and should not be used as the sole basis for treatment or other patient management decisions. A negative result may occur with  improper specimen collection/handling, submission of specimen other than nasopharyngeal swab, presence of viral mutation(s) within the areas targeted by this assay, and inadequate number of viral copies(<138 copies/mL). A negative result must be combined with clinical observations, patient history, and epidemiological information. The expected result is Negative.  Fact Sheet for Patients:  EntrepreneurPulse.com.au  Fact Sheet for Healthcare Providers:  IncredibleEmployment.be  This test is no t yet approved or cleared by the [place] FDA and  has been authorized for detection and/or diagnosis of SARS-CoV-2 by FDA under an Emergency Use Authorization (EUA). This EUA will remain  in effect (meaning this test can be used) for the duration of the COVID-19 declaration under Section 564(b)(1) of the Act, 21 U.S.C.section 360bbb-3(b)(1), unless the authorization is terminated  or revoked sooner.       Influenza A by PCR NEGATIVE NEGATIVE Final   Influenza B by PCR NEGATIVE NEGATIVE Final    Comment: (NOTE) The Xpert Xpress SARS-CoV-2/FLU/RSV plus assay is intended as an aid in the diagnosis of influenza from Nasopharyngeal swab specimens and should not be used as a sole basis for treatment. Nasal washings and aspirates are unacceptable for Xpert Xpress SARS-CoV-2/FLU/RSV testing.  Fact Sheet for Patients: EntrepreneurPulse.com.au  Fact Sheet for Healthcare Providers: IncredibleEmployment.be  This test is not yet approved or cleared by the [place] FDA and has been  authorized for detection and/or diagnosis of SARS-CoV-2 by FDA under an Emergency Use Authorization (EUA). This EUA will remain in effect (meaning this test can be used) for the duration of the COVID-19 declaration under Section 564(b)(1) of the Act, 21 U.S.C. section 360bbb-3(b)(1), unless the authorization is terminated or revoked.     Resp Syncytial Virus by PCR NEGATIVE NEGATIVE Final    Comment: (NOTE) Fact Sheet for Patients: EntrepreneurPulse.com.au  Fact Sheet for Healthcare Providers: IncredibleEmployment.be  This test is not yet approved or cleared by the [place] FDA and has been authorized for detection and/or diagnosis of SARS-CoV-2 by FDA under an Emergency Use Authorization (EUA). This EUA will remain in effect (meaning this test can be used) for the duration of the COVID-19 declaration under Section 564(b)(1) of the Act, 21 U.S.C. section 360bbb-3(b)(1), unless the authorization is terminated or revoked.  Performed at [facility], [address] [place], [place] [address]   Respiratory (~20 pathogens) panel by PCR     Status: None   Collection Time: 06/17/22  4:49 AM   Specimen: Nasopharyngeal Swab; Respiratory  Result Value Ref Range Status   Adenovirus NOT DETECTED NOT DETECTED Final   Coronavirus 229E NOT DETECTED NOT DETECTED Final    Comment: (NOTE) The Coronavirus on the Respiratory Panel, DOES NOT test for the novel  Coronavirus (2019 nCoV)    Coronavirus HKU1 NOT DETECTED NOT DETECTED Final   Coronavirus NL63 NOT DETECTED NOT DETECTED Final   Coronavirus OC43 NOT DETECTED NOT DETECTED Final   Metapneumovirus NOT DETECTED NOT DETECTED Final   Rhinovirus / Enterovirus NOT DETECTED NOT DETECTED Final   Influenza A NOT DETECTED NOT DETECTED Final   Influenza B NOT DETECTED NOT DETECTED Final   Parainfluenza Virus 1 NOT DETECTED NOT DETECTED Final   Parainfluenza Virus 2 NOT DETECTED  NOT  DETECTED Final   Parainfluenza Virus 3 NOT DETECTED NOT DETECTED Final   Parainfluenza Virus 4 NOT DETECTED NOT DETECTED Final   Respiratory Syncytial Virus NOT DETECTED NOT DETECTED Final   Bordetella pertussis NOT DETECTED NOT DETECTED Final   Bordetella Parapertussis NOT DETECTED NOT DETECTED Final   Chlamydophila pneumoniae NOT DETECTED NOT DETECTED Final   Mycoplasma pneumoniae NOT DETECTED NOT DETECTED Final    Comment: Performed at [facility], [place] [address] [place], [place] [address]    Labs: CBC: Recent Labs  Lab 06/14/22 2004 06/16/22 1017 06/17/22 0355  WBC 7.6 12.6* 12.5*  NEUTROABS 5.4 11.0* 11.0*  HGB 13.6 13.5 13.6  HCT 40.6 40.2 41.5  MCV 93.1 93.3 94.7  PLT 173 194 824   Basic Metabolic Panel: Recent Labs  Lab 06/14/22 2004 06/15/22 1433 06/16/22 0016 06/16/22 1017 06/17/22 0355  NA 127* 134* 131*  --  130*  K 5.1 4.0 4.7  --  4.9  CL 92* 98 99  --  100  CO2 '26 25 25  '$ --  22  GLUCOSE 119* 112* 174*  --  148*  BUN 47* 36* 39*  --  38*  CREATININE 1.71* 1.41* 1.57*  --  1.32*  CALCIUM 11.4* 11.2* 10.7*  --  11.3*  MG  --   --   --  2.5* 2.7*  PHOS  --   --   --  2.5 2.9   Liver Function Tests: Recent Labs  Lab 06/14/22 2004 06/17/22 0355  AST 17 30  ALT 22 22  ALKPHOS 60 67  BILITOT 0.9 0.8  PROT 7.4 7.3  ALBUMIN 4.6 4.3   CBG: Recent Labs  Lab 06/16/22 1627 06/16/22 2014 06/17/22 0631 06/17/22 0748 06/17/22 1125  GLUCAP 204* 207* 142* 134* 119*    Discharge time spent: {LESS THAN/GREATER THAN:26388} 30 minutes.  Signed: Kerney Elbe, DO [facility] 06/17/2022

## 2022-06-17 NOTE — Progress Notes (Signed)
Mobility Specialist - Progress Note   06/17/22 0900  Mobility  Activity Ambulated independently in hallway  Level of Assistance Independent  Assistive Device None  Distance Ambulated (ft) 550 ft  Activity Response Tolerated well  $Mobility charge 1 Mobility    Pt received in recliner and agreeable. No complaints throughout, tolerated on room air. Took standing break x2 d/t bouts of coughing. Left in recliner w/ call bell in reach.   Pre-mobility: SpO2 98% During mobility: SpO2 96% Post-mobility: SpO2 97%  [place] Specialist Please contact via Solicitor or Rehab office at [phone]

## 2022-06-17 NOTE — Plan of Care (Signed)
  Problem: Clinical Measurements: Goal: Will remain free from infection Outcome: Progressing   Problem: Clinical Measurements: Goal: Ability to maintain clinical measurements within normal limits will improve Outcome: Progressing   Problem: Clinical Measurements: Goal: Respiratory complications will improve Outcome: Progressing

## 2022-06-28 ENCOUNTER — Inpatient Hospital Stay: Payer: Medicare HMO | Admitting: Primary Care

## 2022-07-14 ENCOUNTER — Ambulatory Visit (INDEPENDENT_AMBULATORY_CARE_PROVIDER_SITE_OTHER): Payer: Medicare HMO | Admitting: Pulmonary Disease

## 2022-07-14 ENCOUNTER — Encounter (HOSPITAL_BASED_OUTPATIENT_CLINIC_OR_DEPARTMENT_OTHER): Payer: Self-pay | Admitting: Pulmonary Disease

## 2022-07-14 VITALS — BP 118/58 | HR 70 | Ht 70.0 in | Wt 289.0 lb

## 2022-07-14 DIAGNOSIS — R0609 Other forms of dyspnea: Secondary | ICD-10-CM

## 2022-07-14 DIAGNOSIS — J4489 Other specified chronic obstructive pulmonary disease: Secondary | ICD-10-CM | POA: Diagnosis not present

## 2022-07-14 HISTORY — DX: Other forms of dyspnea: R06.09

## 2022-07-14 NOTE — Progress Notes (Signed)
Subjective:   PATIENT ID: Francis Cardenas GENDER: male DOB: March 26, 1951, MRN: [id number]  Chief Complaint  Patient presents with   Hospitalization Follow-up    Feeling better, [facility]    Reason for Visit: Hospital Follow-up   [name] is a 72 year old male with COPD (no PFTs), childhood asthma, OSA on CPAP,  chronic diastolic heart failure, paroxysmal atrial fibrillation on Eliquis, HTN, HLD, hx melanoma, hx prostate cancer who presents for hospital follow-up for COPD exacerbation.  Discharge summary 06/17/22 reviewed. Admitted for COPD/asthma exacerbation, acute hyponatremia and AKI. Treated with doxycycline, solumedrol>prednisone. Did not require O2 discharge. Currently doing well since discharge. He was diagnosed with COPD ~20 years ago. Uses Stiolto for the last year. Uses albuterol once a week. He usually needs prednisone annually for exacerbations. His last hospitalization related to COPD was 05/2022 and before this was in 02/2019 requiring intubation. Denies shortness of breath, cough or wheezing at baseline. He has had multiple hospitalizations related to heart failure and severe gout in 2023. He is sedentary at baseline and uses an Transport planner for long distances.  Social History: Former smoker. 13 pack years. Quit in 1980. [employer] - [employer]. Not deployed  I have personally reviewed patient's past medical/family/social history, allergies, current medications.  Past Medical History:  Diagnosis Date   BPH (benign prostatic hyperplasia)    Cancer (HCC)    prostate   COPD (chronic obstructive pulmonary disease) (HCC)    Diabetes mellitus without complication (HCC)    Hypercholesteremia    Hypertension      Family History  Problem Relation Age of Onset   CAD Mother    Leukemia Mother    Heart attack Father    Heart attack Brother      Social History   Occupational History   Not on file  Tobacco Use   Smoking status: Former     Passive exposure: Never   Smokeless tobacco: Never  Vaping Use   Vaping Use: Never used  Substance and Sexual Activity   Alcohol use: Yes    Alcohol/week: 8.0 standard drinks of alcohol    Types: 8 Cans of beer per week    Comment: 8 cans beer/day   Drug use: No   Sexual activity: Not Currently    Allergies  Allergen Reactions   Oxcarbazepine Other (See Comments)    Dizziness/ lightheaded   Zolpidem Nausea Only     Outpatient Medications Prior to Visit  Medication Sig Dispense Refill   acetaminophen (TYLENOL) 325 MG tablet Take 2 tablets (650 mg total) by mouth every 6 (six) hours as needed. 36 tablet 0   albuterol (PROVENTIL HFA;VENTOLIN HFA) 108 (90 BASE) MCG/ACT inhaler Inhale 2 puffs into the lungs every 6 (six) hours as needed for wheezing.     allopurinol (ZYLOPRIM) 100 MG tablet Take 100 mg by mouth daily.     allopurinol (ZYLOPRIM) 300 MG tablet Take 1 tablet by mouth daily.     ALPRAZolam (XANAX) 1 MG tablet Take 1 mg by mouth at bedtime.     amLODipine (NORVASC) 10 MG tablet Take 10 mg by mouth daily.     apixaban (ELIQUIS) 5 MG TABS tablet Take 1 tablet (5 mg total) by mouth 2 (two) times daily. 180 tablet 1   atorvastatin (LIPITOR) 80 MG tablet Take 40 mg by mouth daily with supper.     empagliflozin (JARDIANCE) 25 MG TABS tablet Take 12.5 mg by mouth 2 (two) times  daily.     gabapentin (NEURONTIN) 300 MG capsule Take 300 mg by mouth 3 (three) times daily.     hydrALAZINE (APRESOLINE) 25 MG tablet Take 25 mg by mouth daily.     Magnesium Oxide 420 MG TABS Take 420 mg by mouth daily with supper.     metoprolol succinate (TOPROL-XL) 25 MG 24 hr tablet Take 0.5 tablets (12.5 mg total) by mouth daily. 45 tablet 1   Multiple Vitamins-Minerals (MULTIVITAMIN WITH MINERALS) tablet Take 1 tablet by mouth every Monday, Wednesday, and Friday.     omeprazole (PRILOSEC) 20 MG capsule Take 20 mg by mouth daily with supper.     potassium chloride SA (KLOR-CON M) 20 MEQ tablet Take 1  tablet (20 mEq total) by mouth daily with supper.     spironolactone (ALDACTONE) 25 MG tablet Take 25 mg by mouth every morning.     Tiotropium Bromide-Olodaterol (STIOLTO RESPIMAT) 2.5-2.5 MCG/ACT AERS Inhale 2 puffs into the lungs daily. 4 g 11   torsemide (DEMADEX) 20 MG tablet Take 20 mg by mouth 2 (two) times daily.     vitamin B-12 (CYANOCOBALAMIN) 500 MCG tablet Take 500 mcg by mouth daily with supper.     thiamine 100 MG tablet Take 1 tablet (100 mg total) by mouth daily. (Patient taking differently: Take 100 mg by mouth daily. Vitamin B1) 14 tablet 0   Tiotropium Bromide-Olodaterol 2.5-2.5 MCG/ACT AERS Inhale 2 each into the lungs daily.     benzonatate (TESSALON) 200 MG capsule Take 1 capsule (200 mg total) by mouth 2 (two) times daily. 20 capsule 0   chlorpheniramine-HYDROcodone (TUSSIONEX) 10-8 MG/5ML Take 5 mLs by mouth every 12 (twelve) hours as needed for cough. 70 mL 0   oxyCODONE-acetaminophen (PERCOCET/ROXICET) 5-325 MG tablet Take 1 tablet by mouth every 6 (six) hours as needed for severe pain. (Patient not taking: Reported on 06/15/2022) 8 tablet 0   predniSONE (STERAPRED UNI-PAK 21 TAB) 10 MG (21) TBPK tablet Take 6 pills on Day 1, 5 pills on Day 2, 4 pills on Day 3, 3 pills on Day 4, 2 pills on Day 5, 1 pill on Day 6 and then Stop 21 tablet 0   No facility-administered medications prior to visit.    Review of Systems  Constitutional:  Negative for chills, diaphoresis, fever, malaise/fatigue and weight loss.  HENT:  Negative for congestion.   Respiratory:  Negative for cough, hemoptysis, sputum production, shortness of breath and wheezing.   Cardiovascular:  Negative for chest pain, palpitations and leg swelling.     Objective:   Vitals:   07/14/22 0835  BP: (!) 118/58  Pulse: 70  SpO2: 98%  Weight: 289 lb (131.1 kg)  Height: 5' 10"$  (1.778 m)   SpO2: 98 % O2 Device: None (Room air)  Body mass index is 41.47 kg/m.  Physical Exam: General: Well-appearing, no  acute distress HENT: [place], AT Eyes: EOMI, no scleral icterus Respiratory: Clear to auscultation bilaterally.  No crackles, wheezing or rales Cardiovascular: RRR, -M/R/G, no JVD Extremities:-Edema,-tenderness Neuro: AAO x4, CNII-XII grossly intact Psych: Normal mood, normal affect  Data Reviewed:  Imaging: CXR 06/17/22 - no infiltrate effusion or edema  PFT: None on file  Labs: CBC    Component Value Date/Time   WBC 12.5 (H) 06/17/2022 0355   RBC 4.38 06/17/2022 0355   HGB 13.6 06/17/2022 0355   HCT 41.5 06/17/2022 0355   PLT 209 06/17/2022 0355   MCV 94.7 06/17/2022 0355   MCH 31.1 06/17/2022 0355  MCHC 32.8 06/17/2022 0355   RDW 14.8 06/17/2022 0355   LYMPHSABS 1.1 06/17/2022 0355   MONOABS 0.3 06/17/2022 0355   EOSABS 0.0 06/17/2022 0355   BASOSABS 0.0 06/17/2022 0355   Absolute  06/14/22 - 500     Assessment & Plan:   Discussion: 72 year old male with COPD (no PFTs), childhood asthma, OSA on CPAP,  chronic diastolic heart failure, paroxysmal atrial fibrillation on Eliquis, HTN, HLD, hx melanoma, hx prostate cancer who presents for hospital follow-up for COPD exacerbation.  COPD-asthma overlap --CONTINUE Stiolto TWO puffs ONCE a day --CONTINUE Albuterol TWO puffs AS NEEDED --Encourage regular aerobic activity including walking and upper body resistance exercises for 20-30 minutes --REFER to pulmonary rehab --ORDER pulmonary function tests  Health Maintenance Immunization History  Administered Date(s) Administered   Influenza, High Dose Seasonal PF 01/29/2018   CT Lung Screen - not qualified. Quit >15 years ago  Orders Placed This Encounter  Procedures   AMB referral to pulmonary rehabilitation    Referral Priority:   Routine    Referral Type:   Consultation    Number of Visits Requested:   1  No orders of the defined types were placed in this encounter.   No follow-ups on file.  I have spent a total time of 45-minutes on the day of the appointment  reviewing prior documentation, coordinating care and discussing medical diagnosis and plan with the patient/family. Imaging, labs and tests included in this note have been reviewed and interpreted independently by me.  [place], MD [facility] 07/14/2022 9:04 AM  Office Number [phone]

## 2022-07-14 NOTE — Patient Instructions (Signed)
COPD-asthma overlap --CONTINUE Stiolto TWO puffs ONCE a day --CONTINUE Albuterol TWO puffs AS NEEDED --Encourage regular aerobic activity including walking and upper body resistance exercises for 20-30 minutes --REFER to pulmonary rehab --ORDER pulmonary function tests  Follow-up with me in 3 months with PFTs prior to visit

## 2022-07-15 ENCOUNTER — Telehealth (HOSPITAL_COMMUNITY): Payer: Self-pay

## 2022-07-15 NOTE — Telephone Encounter (Signed)
Received referral from Dr. Loanne Drilling for this pt to participate in Pulmonary Rehab with the diagnosis of COPD I. Clinical review of pt follow up appt on 07/14/2022 Pulmonary office note. Pt appropriate for scheduling for Pulmonary rehab. Will forward to support staff for scheduling and verification of insurance eligibility/benefits with pt consent.   Janine Ores, RN, BSN Cardiac and Pulmonary Rehab

## 2022-07-29 ENCOUNTER — Encounter (HOSPITAL_COMMUNITY): Payer: Self-pay

## 2022-08-23 ENCOUNTER — Ambulatory Visit (HOSPITAL_BASED_OUTPATIENT_CLINIC_OR_DEPARTMENT_OTHER): Payer: Medicare HMO | Admitting: Pulmonary Disease

## 2022-08-24 ENCOUNTER — Ambulatory Visit (INDEPENDENT_AMBULATORY_CARE_PROVIDER_SITE_OTHER): Payer: Medicare HMO | Admitting: Pulmonary Disease

## 2022-08-24 ENCOUNTER — Encounter (HOSPITAL_BASED_OUTPATIENT_CLINIC_OR_DEPARTMENT_OTHER): Payer: Self-pay | Admitting: Pulmonary Disease

## 2022-08-24 VITALS — BP 140/52 | HR 84 | Ht 70.0 in | Wt 288.8 lb

## 2022-08-24 DIAGNOSIS — J441 Chronic obstructive pulmonary disease with (acute) exacerbation: Secondary | ICD-10-CM | POA: Diagnosis not present

## 2022-08-24 MED ORDER — SPIRIVA RESPIMAT 2.5 MCG/ACT IN AERS: 2.0000 | INHALATION_SPRAY | Freq: Every day | RESPIRATORY_TRACT | 5 refills | Status: DC

## 2022-08-24 MED ORDER — FLUTICASONE-SALMETEROL 250-50 MCG/ACT IN AEPB: 1.0000 | INHALATION_SPRAY | Freq: Two times a day (BID) | RESPIRATORY_TRACT | 5 refills | Status: DC

## 2022-08-24 MED ORDER — PREDNISONE 20 MG PO TABS: 40.0000 mg | ORAL_TABLET | Freq: Every day | ORAL | 0 refills | Status: DC

## 2022-08-24 MED ORDER — AZITHROMYCIN 250 MG PO TABS: ORAL_TABLET | ORAL | 0 refills | Status: DC

## 2022-08-24 NOTE — Patient Instructions (Addendum)
--  START prednisone 40 mg x 5 days --START azithromycin x 5 days --STOP Stiolto  --START Wixela 250-50 mcg ONE puff in the morning and evening. Rinse mouth out after use --START Spiriva Respimat TWO puffs ONCE a day --CONTINUE Albuterol TWO puffs AS NEEDED --Encourage regular aerobic activity including walking and upper body resistance exercises for 20-30 minutes --Pulmonary rehab contacted. [facility] on [facility]  Next visit is already scheduled on 10/13/22

## 2022-08-24 NOTE — Progress Notes (Signed)
Subjective:   PATIENT ID: Francis Cardenas GENDER: male DOB: 1951/04/08, MRN: [id number]  Chief Complaint  Patient presents with   Acute Visit    Wheezing, chest congestion and sob    Reason for Visit: Acute visit  [name] is a 72 year old male with COPD (no PFTs), childhood asthma, OSA on CPAP,  chronic diastolic heart failure, paroxysmal atrial fibrillation on Eliquis, HTN, HLD, hx melanoma, hx prostate cancer who presents for acute visit  07/14/22 Hospital follow-up. Admitted for COPD/asthma exacerbation, acute hyponatremia and AKI. Treated with doxycycline, solumedrol>prednisone. Did not require O2 discharge. Currently doing well since discharge. He was diagnosed with COPD ~20 years ago. Uses Stiolto for the last year. Uses albuterol once a week. He usually needs prednisone annually for exacerbations. His last hospitalization related to COPD was 05/2022 and before this was in 02/2019 requiring intubation. Denies shortness of breath, cough or wheezing at baseline. He has had multiple hospitalizations related to heart failure and severe gout in 2023. He is sedentary at baseline and uses an Transport planner for long distances.  08/24/22 Since our last visit he reports wheezing, chest congestion and shortness of breath. Denies fevers, chills. He reports history of childhood asthma. Has had worsening symptoms in the last few weeks. No known sick contacts. Compliant with Stiolto but needing to use albuterol more frequently including in the evenings.  Social History: Former smoker. 13 pack years. Quit in 1980. [employer] - [employer]. Not deployed  Past Medical History:  Diagnosis Date   BPH (benign prostatic hyperplasia)    Cancer    prostate   COPD (chronic obstructive pulmonary disease)    Diabetes mellitus without complication    Hypercholesteremia    Hypertension      Family History  Problem Relation Age of Onset   CAD Mother    Leukemia Mother    Heart  attack Father    Heart attack Brother      Social History   Occupational History   Not on file  Tobacco Use   Smoking status: Former    Passive exposure: Never   Smokeless tobacco: Never  Vaping Use   Vaping Use: Never used  Substance and Sexual Activity   Alcohol use: Yes    Alcohol/week: 8.0 standard drinks of alcohol    Types: 8 Cans of beer per week    Comment: 8 cans beer/day   Drug use: No   Sexual activity: Not Currently    Allergies  Allergen Reactions   Oxcarbazepine Other (See Comments)    Dizziness/ lightheaded   Zolpidem Nausea Only     Outpatient Medications Prior to Visit  Medication Sig Dispense Refill   acetaminophen (TYLENOL) 325 MG tablet Take 2 tablets (650 mg total) by mouth every 6 (six) hours as needed. 36 tablet 0   albuterol (PROVENTIL HFA;VENTOLIN HFA) 108 (90 BASE) MCG/ACT inhaler Inhale 2 puffs into the lungs every 6 (six) hours as needed for wheezing.     allopurinol (ZYLOPRIM) 100 MG tablet Take 100 mg by mouth daily.     allopurinol (ZYLOPRIM) 300 MG tablet Take 1 tablet by mouth daily.     ALPRAZolam (XANAX) 1 MG tablet Take 1 mg by mouth at bedtime.     amLODipine (NORVASC) 10 MG tablet Take 10 mg by mouth daily.     apixaban (ELIQUIS) 5 MG TABS tablet Take 1 tablet (5 mg total) by mouth 2 (two) times daily. 180 tablet 1  atorvastatin (LIPITOR) 80 MG tablet Take 40 mg by mouth daily with supper.     empagliflozin (JARDIANCE) 25 MG TABS tablet Take 12.5 mg by mouth 2 (two) times daily.     gabapentin (NEURONTIN) 300 MG capsule Take 300 mg by mouth 3 (three) times daily.     hydrALAZINE (APRESOLINE) 25 MG tablet Take 25 mg by mouth daily.     Magnesium Oxide 420 MG TABS Take 420 mg by mouth daily with supper.     metoprolol succinate (TOPROL-XL) 25 MG 24 hr tablet Take 0.5 tablets (12.5 mg total) by mouth daily. 45 tablet 1   Multiple Vitamins-Minerals (MULTIVITAMIN WITH MINERALS) tablet Take 1 tablet by mouth every Monday, Wednesday, and  Friday.     omeprazole (PRILOSEC) 20 MG capsule Take 20 mg by mouth daily with supper.     potassium chloride SA (KLOR-CON M) 20 MEQ tablet Take 1 tablet (20 mEq total) by mouth daily with supper.     spironolactone (ALDACTONE) 25 MG tablet Take 25 mg by mouth every morning.     torsemide (DEMADEX) 20 MG tablet Take 20 mg by mouth 2 (two) times daily.     vitamin B-12 (CYANOCOBALAMIN) 500 MCG tablet Take 500 mcg by mouth daily with supper.     Tiotropium Bromide-Olodaterol (STIOLTO RESPIMAT) 2.5-2.5 MCG/ACT AERS Inhale 2 puffs into the lungs daily. 4 g 11   No facility-administered medications prior to visit.    Review of Systems  Constitutional:  Negative for chills, diaphoresis, fever, malaise/fatigue and weight loss.  HENT:  Positive for congestion.   Respiratory:  Positive for cough and shortness of breath. Negative for hemoptysis, sputum production and wheezing.   Cardiovascular:  Negative for chest pain, palpitations and leg swelling.     Objective:   Vitals:   08/24/22 0912  BP: (!) 140/52  Pulse: 84  SpO2: 96%  Weight: 288 lb 12.8 oz (131 kg)  Height: 5\' 10"  (1.778 m)   SpO2: 96 % O2 Device: None (Room air)  Body mass index is 41.44 kg/m.  Physical Exam: General: Well-appearing, no acute distress HENT: [place], AT Eyes: EOMI, no scleral icterus Respiratory: Diminished to auscultation bilaterally.  Scattered wheezing Cardiovascular: RRR, -M/R/G, no JVD Extremities:-Edema,-tenderness Neuro: AAO x4, CNII-XII grossly intact Psych: Normal mood, normal affect  Data Reviewed:  Imaging: CXR 06/17/22 - no infiltrate effusion or edema  PFT: None on file  Labs: CBC    Component Value Date/Time   WBC 12.5 (H) 06/17/2022 0355   RBC 4.38 06/17/2022 0355   HGB 13.6 06/17/2022 0355   HCT 41.5 06/17/2022 0355   PLT 209 06/17/2022 0355   MCV 94.7 06/17/2022 0355   MCH 31.1 06/17/2022 0355   MCHC 32.8 06/17/2022 0355   RDW 14.8 06/17/2022 0355   LYMPHSABS 1.1  06/17/2022 0355   MONOABS 0.3 06/17/2022 0355   EOSABS 0.0 06/17/2022 0355   BASOSABS 0.0 06/17/2022 0355   Absolute  06/14/22 - 500     Assessment & Plan:   Discussion: 72 year old male with COPD (no PFTs), childhood asthma, OSA on CPAP,  chronic diastolic heart failure, paroxysmal atrial fibrillation on Eliquis, HTN, HLD, hx melanoma, hx prostate cancer who presents for acute visit. In COPD exacerbation. Recent hospitalization two months ago for COPD exacerbation. He prefers to avoid prolonged steroid taper unless needed. Baseline symptoms have worsened since his hospital discharge two months ago. Discussed clinical course and management of COPD/asthma including bronchodilator regimen and action plan for exacerbation.  COPD-asthma overlap in exacerbation --START prednisone 40 mg x 5 days --START azithromycin x 5 days --STOP Stiolto  --START Wixela 250-50 mcg ONE puff in the morning and evening. Rinse mouth out after use --START Spiriva Respimat TWO puffs ONCE a day --CONTINUE Albuterol TWO puffs AS NEEDED --Encourage regular aerobic activity including walking and upper body resistance exercises for 20-30 minutes --Pulmonary rehab contacted. Patient waiting on [facility]  Health Maintenance Immunization History  Administered Date(s) Administered   Influenza, High Dose Seasonal PF 01/29/2018   CT Lung Screen - not qualified. Quit >15 years ago  No orders of the defined types were placed in this encounter.  Meds ordered this encounter  Medications   predniSONE (DELTASONE) 20 MG tablet    Sig: Take 2 tablets (40 mg total) by mouth daily with breakfast for 5 days.    Dispense:  10 tablet    Refill:  0   fluticasone-salmeterol (WIXELA INHUB) 250-50 MCG/ACT AEPB    Sig: Inhale 1 puff into the lungs in the morning and at bedtime.    Dispense:  60 each    Refill:  5   Tiotropium Bromide Monohydrate (SPIRIVA RESPIMAT) 2.5 MCG/ACT AERS    Sig: Inhale 2 puffs into the lungs daily.     Dispense:  4 g    Refill:  5   azithromycin (ZITHROMAX) 250 MG tablet    Sig: Take two tablets on day 1, then one tablet daily on day 2-5.    Dispense:  6 tablet    Refill:  0    No follow-ups on file. Follow-up already scheduled  I have spent a total time of 35-minutes on the day of the appointment including chart review, data review, collecting history, coordinating care and discussing medical diagnosis and plan with the patient/family. Past medical history, allergies, medications were reviewed. Pertinent imaging, labs and tests included in this note have been reviewed and interpreted independently by me.  [place], MD [facility] 08/24/2022 9:42 AM  Office Number [phone]

## 2022-08-25 ENCOUNTER — Telehealth (HOSPITAL_BASED_OUTPATIENT_CLINIC_OR_DEPARTMENT_OTHER): Payer: Self-pay | Admitting: Pulmonary Disease

## 2022-08-25 MED ORDER — PREDNISONE 20 MG PO TABS: 40.0000 mg | ORAL_TABLET | Freq: Every day | ORAL | 0 refills | Status: AC

## 2022-08-25 MED ORDER — AZITHROMYCIN 250 MG PO TABS: ORAL_TABLET | ORAL | 0 refills | Status: DC

## 2022-08-25 NOTE — Telephone Encounter (Signed)
[facility] said can't [facility] out scripts Dr. Loanne Drilling sent in for patient because pcp at [facility] states Dr. Loanne Drilling is not an approved for an outside pulmonologist so will not send him scripts from Dr. Loanne Drilling, needs to resend scripts [facility] [address]. Patient needs these meds still pretty sick.

## 2022-08-25 NOTE — Telephone Encounter (Signed)
Medication sent [facility]

## 2022-08-25 NOTE — Telephone Encounter (Signed)
Patient calling back again. Not feeling well.

## 2022-08-26 ENCOUNTER — Telehealth: Payer: Self-pay | Admitting: Pulmonary Disease

## 2022-08-26 MED ORDER — FLUTICASONE-SALMETEROL 250-50 MCG/ACT IN AEPB: 1.0000 | INHALATION_SPRAY | Freq: Two times a day (BID) | RESPIRATORY_TRACT | 5 refills | Status: DC

## 2022-08-26 MED ORDER — SPIRIVA RESPIMAT 2.5 MCG/ACT IN AERS: 2.0000 | INHALATION_SPRAY | Freq: Every day | RESPIRATORY_TRACT | 5 refills | Status: DC

## 2022-08-26 NOTE — Telephone Encounter (Signed)
Medication sent to [facility].

## 2022-09-02 ENCOUNTER — Telehealth: Payer: Self-pay | Admitting: Pulmonary Disease

## 2022-09-02 NOTE — Telephone Encounter (Signed)
Patient would like to RX Wixela and Spiriva sent to [facility]. Would like [facility] to send to [facility] for patient o pick up. Patient is confused about where to pick up RX's. Patient phone number is [phone].

## 2022-09-05 MED ORDER — FLUTICASONE-SALMETEROL 250-50 MCG/ACT IN AEPB: 1.0000 | INHALATION_SPRAY | Freq: Two times a day (BID) | RESPIRATORY_TRACT | 5 refills | Status: AC

## 2022-09-05 MED ORDER — SPIRIVA RESPIMAT 2.5 MCG/ACT IN AERS: 2.0000 | INHALATION_SPRAY | Freq: Every day | RESPIRATORY_TRACT | 5 refills | Status: AC

## 2022-09-05 NOTE — Telephone Encounter (Signed)
Pt called the office about his inhalers. Stated that he called the [place] and was approved community care for Dr. Everardo All with the [place] and due to this he needed his spiriva and wixela sent to the [facility] pharmacy for him. Rx have been sent for pt. Nothing further needed.

## 2022-09-14 ENCOUNTER — Emergency Department (HOSPITAL_COMMUNITY): Payer: No Typology Code available for payment source

## 2022-09-14 ENCOUNTER — Other Ambulatory Visit: Payer: Self-pay

## 2022-09-14 ENCOUNTER — Encounter (HOSPITAL_COMMUNITY): Payer: Self-pay | Admitting: Internal Medicine

## 2022-09-14 ENCOUNTER — Observation Stay (HOSPITAL_COMMUNITY)
Admission: EM | Admit: 2022-09-14 | Discharge: 2022-09-15 | Disposition: A | Discharge status: Home Health | Payer: No Typology Code available for payment source | Attending: Emergency Medicine | Admitting: Emergency Medicine

## 2022-09-14 DIAGNOSIS — E782 Mixed hyperlipidemia: Secondary | ICD-10-CM | POA: Diagnosis present

## 2022-09-14 DIAGNOSIS — Y92009 Unspecified place in unspecified non-institutional (private) residence as the place of occurrence of the external cause: Secondary | ICD-10-CM

## 2022-09-14 DIAGNOSIS — J45909 Unspecified asthma, uncomplicated: Secondary | ICD-10-CM | POA: Diagnosis not present

## 2022-09-14 DIAGNOSIS — E669 Obesity, unspecified: Secondary | ICD-10-CM | POA: Diagnosis present

## 2022-09-14 DIAGNOSIS — Z8546 Personal history of malignant neoplasm of prostate: Secondary | ICD-10-CM | POA: Diagnosis not present

## 2022-09-14 DIAGNOSIS — I5032 Chronic diastolic (congestive) heart failure: Secondary | ICD-10-CM | POA: Diagnosis not present

## 2022-09-14 DIAGNOSIS — E871 Hypo-osmolality and hyponatremia: Secondary | ICD-10-CM | POA: Diagnosis not present

## 2022-09-14 DIAGNOSIS — I10 Essential (primary) hypertension: Secondary | ICD-10-CM | POA: Diagnosis not present

## 2022-09-14 DIAGNOSIS — I130 Hypertensive heart and chronic kidney disease with heart failure and stage 1 through stage 4 chronic kidney disease, or unspecified chronic kidney disease: Secondary | ICD-10-CM | POA: Insufficient documentation

## 2022-09-14 DIAGNOSIS — Z7901 Long term (current) use of anticoagulants: Secondary | ICD-10-CM | POA: Diagnosis not present

## 2022-09-14 DIAGNOSIS — F1010 Alcohol abuse, uncomplicated: Secondary | ICD-10-CM | POA: Diagnosis present

## 2022-09-14 DIAGNOSIS — Z87891 Personal history of nicotine dependence: Secondary | ICD-10-CM | POA: Diagnosis not present

## 2022-09-14 DIAGNOSIS — Y92019 Unspecified place in single-family (private) house as the place of occurrence of the external cause: Secondary | ICD-10-CM | POA: Insufficient documentation

## 2022-09-14 DIAGNOSIS — N400 Enlarged prostate without lower urinary tract symptoms: Secondary | ICD-10-CM | POA: Diagnosis present

## 2022-09-14 DIAGNOSIS — N179 Acute kidney failure, unspecified: Secondary | ICD-10-CM | POA: Diagnosis not present

## 2022-09-14 DIAGNOSIS — F1020 Alcohol dependence, uncomplicated: Secondary | ICD-10-CM | POA: Diagnosis present

## 2022-09-14 DIAGNOSIS — E1122 Type 2 diabetes mellitus with diabetic chronic kidney disease: Secondary | ICD-10-CM | POA: Diagnosis not present

## 2022-09-14 DIAGNOSIS — R262 Difficulty in walking, not elsewhere classified: Secondary | ICD-10-CM | POA: Diagnosis present

## 2022-09-14 DIAGNOSIS — N1831 Chronic kidney disease, stage 3a: Secondary | ICD-10-CM | POA: Diagnosis not present

## 2022-09-14 DIAGNOSIS — E119 Type 2 diabetes mellitus without complications: Secondary | ICD-10-CM | POA: Diagnosis present

## 2022-09-14 DIAGNOSIS — I4819 Other persistent atrial fibrillation: Secondary | ICD-10-CM | POA: Diagnosis not present

## 2022-09-14 DIAGNOSIS — Z79899 Other long term (current) drug therapy: Secondary | ICD-10-CM | POA: Insufficient documentation

## 2022-09-14 DIAGNOSIS — E1169 Type 2 diabetes mellitus with other specified complication: Secondary | ICD-10-CM | POA: Diagnosis present

## 2022-09-14 DIAGNOSIS — J449 Chronic obstructive pulmonary disease, unspecified: Principal | ICD-10-CM | POA: Insufficient documentation

## 2022-09-14 DIAGNOSIS — E7800 Pure hypercholesterolemia, unspecified: Secondary | ICD-10-CM | POA: Diagnosis present

## 2022-09-14 DIAGNOSIS — W19XXXA Unspecified fall, initial encounter: Secondary | ICD-10-CM | POA: Diagnosis not present

## 2022-09-14 DIAGNOSIS — G4733 Obstructive sleep apnea (adult) (pediatric): Secondary | ICD-10-CM | POA: Diagnosis present

## 2022-09-14 DIAGNOSIS — J4489 Other specified chronic obstructive pulmonary disease: Secondary | ICD-10-CM | POA: Diagnosis present

## 2022-09-14 HISTORY — DX: Unspecified fall, initial encounter: W19.XXXA

## 2022-09-14 HISTORY — DX: Obstructive sleep apnea (adult) (pediatric): G47.33

## 2022-09-14 HISTORY — DX: Alcohol dependence, uncomplicated: F10.20

## 2022-09-14 LAB — CBC WITH DIFFERENTIAL/PLATELET
Abs Immature Granulocytes: 0.15 10*3/uL — ABNORMAL HIGH (ref 0.00–0.07)
Basophils Absolute: 0.1 10*3/uL (ref 0.0–0.1)
Basophils Relative: 1 %
Eosinophils Absolute: 0.4 10*3/uL (ref 0.0–0.5)
Eosinophils Relative: 5 %
HCT: 38.6 % — ABNORMAL LOW (ref 39.0–52.0)
Hemoglobin: 12.5 g/dL — ABNORMAL LOW (ref 13.0–17.0)
Immature Granulocytes: 2 %
Lymphocytes Relative: 18 %
Lymphs Abs: 1.4 10*3/uL (ref 0.7–4.0)
MCH: 31.7 pg (ref 26.0–34.0)
MCHC: 32.4 g/dL (ref 30.0–36.0)
MCV: 98 fL (ref 80.0–100.0)
Monocytes Absolute: 0.5 10*3/uL (ref 0.1–1.0)
Monocytes Relative: 7 %
Neutro Abs: 5.5 10*3/uL (ref 1.7–7.7)
Neutrophils Relative %: 67 %
Platelets: 167 10*3/uL (ref 150–400)
RBC: 3.94 MIL/uL — ABNORMAL LOW (ref 4.22–5.81)
RDW: 15.2 % (ref 11.5–15.5)
WBC: 8 10*3/uL (ref 4.0–10.5)
nRBC: 0 % (ref 0.0–0.2)

## 2022-09-14 LAB — COMPREHENSIVE METABOLIC PANEL
ALT: 24 U/L (ref 0–44)
AST: 23 U/L (ref 15–41)
Albumin: 4.1 g/dL (ref 3.5–5.0)
Alkaline Phosphatase: 82 U/L (ref 38–126)
Anion gap: 13 (ref 5–15)
BUN: 38 mg/dL — ABNORMAL HIGH (ref 8–23)
CO2: 17 mmol/L — ABNORMAL LOW (ref 22–32)
Calcium: 10.5 mg/dL — ABNORMAL HIGH (ref 8.9–10.3)
Chloride: 94 mmol/L — ABNORMAL LOW (ref 98–111)
Creatinine, Ser: 1.76 mg/dL — ABNORMAL HIGH (ref 0.61–1.24)
GFR, Estimated: 40 mL/min — ABNORMAL LOW (ref >60)
Glucose, Bld: 93 mg/dL (ref 70–99)
Potassium: 4.8 mmol/L (ref 3.5–5.1)
Sodium: 124 mmol/L — ABNORMAL LOW (ref 135–145)
Total Bilirubin: 1 mg/dL (ref 0.3–1.2)
Total Protein: 6.7 g/dL (ref 6.5–8.1)

## 2022-09-14 LAB — PROTIME-INR
INR: 1.4 — ABNORMAL HIGH (ref 0.8–1.2)
Prothrombin Time: 16.7 seconds — ABNORMAL HIGH (ref 11.4–15.2)

## 2022-09-14 LAB — GLUCOSE, CAPILLARY: Glucose-Capillary: 143 mg/dL — ABNORMAL HIGH (ref 70–99)

## 2022-09-14 LAB — ETHANOL: Alcohol, Ethyl (B): 74 mg/dL — ABNORMAL HIGH (ref <10)

## 2022-09-14 MED ORDER — TORSEMIDE 20 MG PO TABS: 20.0000 mg | ORAL_TABLET | Freq: Two times a day (BID) | ORAL | Status: DC

## 2022-09-14 MED ORDER — UMECLIDINIUM BROMIDE 62.5 MCG/ACT IN AEPB
1.0000 | INHALATION_SPRAY | Freq: Every day | RESPIRATORY_TRACT | Status: DC
  Administered 2022-09-15: 1 via RESPIRATORY_TRACT
  Filled 2022-09-14: qty 7

## 2022-09-14 MED ORDER — HYDRALAZINE HCL 20 MG/ML IJ SOLN: 5.0000 mg | INTRAMUSCULAR | Status: DC | PRN

## 2022-09-14 MED ORDER — IBUPROFEN 200 MG PO TABS: 600.0000 mg | ORAL_TABLET | Freq: Once | ORAL | Status: DC

## 2022-09-14 MED ORDER — MOMETASONE FURO-FORMOTEROL FUM 200-5 MCG/ACT IN AERO
2.0000 | INHALATION_SPRAY | Freq: Two times a day (BID) | RESPIRATORY_TRACT | Status: DC
  Administered 2022-09-14 – 2022-09-15 (×2): 2 via RESPIRATORY_TRACT
  Filled 2022-09-14: qty 8.8

## 2022-09-14 MED ORDER — PANTOPRAZOLE SODIUM 40 MG PO TBEC
40.0000 mg | DELAYED_RELEASE_TABLET | Freq: Every day | ORAL | Status: DC
  Administered 2022-09-14 – 2022-09-15 (×2): 40 mg via ORAL
  Filled 2022-09-14 (×2): qty 1

## 2022-09-14 MED ORDER — ALBUTEROL SULFATE HFA 108 (90 BASE) MCG/ACT IN AERS: 2.0000 | INHALATION_SPRAY | Freq: Four times a day (QID) | RESPIRATORY_TRACT | Status: DC | PRN

## 2022-09-14 MED ORDER — ACETAMINOPHEN 650 MG RE SUPP: 650.0000 mg | Freq: Four times a day (QID) | RECTAL | Status: DC | PRN

## 2022-09-14 MED ORDER — FOLIC ACID 1 MG PO TABS
1.0000 mg | ORAL_TABLET | Freq: Every day | ORAL | Status: DC
  Administered 2022-09-14 – 2022-09-15 (×2): 1 mg via ORAL
  Filled 2022-09-14 (×2): qty 1

## 2022-09-14 MED ORDER — AMLODIPINE BESYLATE 10 MG PO TABS
10.0000 mg | ORAL_TABLET | Freq: Every day | ORAL | Status: DC
  Administered 2022-09-15: 10 mg via ORAL
  Filled 2022-09-14: qty 1

## 2022-09-14 MED ORDER — MORPHINE SULFATE (PF) 2 MG/ML IV SOLN: 2.0000 mg | INTRAVENOUS | Status: DC | PRN

## 2022-09-14 MED ORDER — POLYETHYLENE GLYCOL 3350 17 G PO PACK: 17.0000 g | PACK | Freq: Every day | ORAL | Status: DC | PRN

## 2022-09-14 MED ORDER — ACETAMINOPHEN 325 MG PO TABS: 650.0000 mg | ORAL_TABLET | Freq: Four times a day (QID) | ORAL | Status: DC | PRN

## 2022-09-14 MED ORDER — ALLOPURINOL 300 MG PO TABS
400.0000 mg | ORAL_TABLET | Freq: Every day | ORAL | Status: DC
  Administered 2022-09-15: 400 mg via ORAL
  Filled 2022-09-14: qty 1

## 2022-09-14 MED ORDER — APIXABAN 5 MG PO TABS
5.0000 mg | ORAL_TABLET | Freq: Two times a day (BID) | ORAL | Status: DC
  Administered 2022-09-14 – 2022-09-15 (×2): 5 mg via ORAL
  Filled 2022-09-14 (×2): qty 1

## 2022-09-14 MED ORDER — ALPRAZOLAM 0.5 MG PO TABS
1.0000 mg | ORAL_TABLET | Freq: Every day | ORAL | Status: DC
  Administered 2022-09-14: 1 mg via ORAL
  Filled 2022-09-14: qty 2

## 2022-09-14 MED ORDER — OXYCODONE HCL 5 MG PO TABS: 5.0000 mg | ORAL_TABLET | ORAL | Status: DC | PRN

## 2022-09-14 MED ORDER — INSULIN ASPART 100 UNIT/ML IJ SOLN: 0.0000 [IU] | Freq: Every day | INTRAMUSCULAR | Status: DC

## 2022-09-14 MED ORDER — ONDANSETRON HCL 4 MG PO TABS: 4.0000 mg | ORAL_TABLET | Freq: Four times a day (QID) | ORAL | Status: DC | PRN

## 2022-09-14 MED ORDER — INSULIN ASPART 100 UNIT/ML IJ SOLN: 0.0000 [IU] | Freq: Three times a day (TID) | INTRAMUSCULAR | Status: DC

## 2022-09-14 MED ORDER — THIAMINE HCL 100 MG/ML IJ SOLN
100.0000 mg | Freq: Every day | INTRAMUSCULAR | Status: DC
  Administered 2022-09-14: 100 mg via INTRAVENOUS
  Filled 2022-09-14: qty 2

## 2022-09-14 MED ORDER — DOCUSATE SODIUM 100 MG PO CAPS
100.0000 mg | ORAL_CAPSULE | Freq: Two times a day (BID) | ORAL | Status: DC
  Filled 2022-09-14 (×2): qty 1

## 2022-09-14 MED ORDER — ATORVASTATIN CALCIUM 40 MG PO TABS
40.0000 mg | ORAL_TABLET | Freq: Every day | ORAL | Status: DC
  Administered 2022-09-14: 40 mg via ORAL
  Filled 2022-09-14: qty 1

## 2022-09-14 MED ORDER — ONDANSETRON HCL 4 MG/2ML IJ SOLN: 4.0000 mg | Freq: Four times a day (QID) | INTRAMUSCULAR | Status: DC | PRN

## 2022-09-14 MED ORDER — MAGNESIUM OXIDE -MG SUPPLEMENT 400 (240 MG) MG PO TABS
420.0000 mg | ORAL_TABLET | Freq: Every day | ORAL | Status: DC
  Administered 2022-09-14: 400 mg via ORAL
  Filled 2022-09-14: qty 1

## 2022-09-14 MED ORDER — ALLOPURINOL 100 MG PO TABS: 300.0000 mg | ORAL_TABLET | Freq: Every day | ORAL | Status: DC

## 2022-09-14 MED ORDER — GABAPENTIN 300 MG PO CAPS
300.0000 mg | ORAL_CAPSULE | Freq: Three times a day (TID) | ORAL | Status: DC
  Administered 2022-09-14 – 2022-09-15 (×3): 300 mg via ORAL
  Filled 2022-09-14 (×3): qty 1

## 2022-09-14 MED ORDER — NYSTATIN 100000 UNIT/GM EX POWD
Freq: Once | CUTANEOUS | Status: AC
  Filled 2022-09-14: qty 15

## 2022-09-14 MED ORDER — ADULT MULTIVITAMIN W/MINERALS CH
1.0000 | ORAL_TABLET | Freq: Every day | ORAL | Status: DC
  Administered 2022-09-14 – 2022-09-15 (×2): 1 via ORAL
  Filled 2022-09-14 (×2): qty 1

## 2022-09-14 MED ORDER — LACTATED RINGERS IV SOLN: INTRAVENOUS | Status: AC

## 2022-09-14 MED ORDER — SPIRONOLACTONE 25 MG PO TABS: 25.0000 mg | ORAL_TABLET | Freq: Every morning | ORAL | Status: DC

## 2022-09-14 MED ORDER — ALBUTEROL SULFATE (2.5 MG/3ML) 0.083% IN NEBU: 2.5000 mg | INHALATION_SOLUTION | Freq: Four times a day (QID) | RESPIRATORY_TRACT | Status: DC | PRN

## 2022-09-14 MED ORDER — BISACODYL 5 MG PO TBEC: 5.0000 mg | DELAYED_RELEASE_TABLET | Freq: Every day | ORAL | Status: DC | PRN

## 2022-09-14 MED ORDER — METOPROLOL SUCCINATE ER 25 MG PO TB24
12.5000 mg | ORAL_TABLET | Freq: Every day | ORAL | Status: DC
  Administered 2022-09-15: 12.5 mg via ORAL
  Filled 2022-09-14: qty 1

## 2022-09-14 MED ORDER — LORAZEPAM 1 MG PO TABS: 1.0000 mg | ORAL_TABLET | ORAL | Status: DC | PRN

## 2022-09-14 MED ORDER — THIAMINE MONONITRATE 100 MG PO TABS
100.0000 mg | ORAL_TABLET | Freq: Every day | ORAL | Status: DC
  Administered 2022-09-15: 100 mg via ORAL
  Filled 2022-09-14 (×2): qty 1

## 2022-09-14 MED ORDER — ALLOPURINOL 100 MG PO TABS: 100.0000 mg | ORAL_TABLET | Freq: Every day | ORAL | Status: DC

## 2022-09-14 NOTE — ED Notes (Signed)
Patient transported to CT with nurse 

## 2022-09-14 NOTE — Progress Notes (Addendum)
Pt arrived to unit accompanied by transportation tech pt stood and pivot to bed w/o issues, A&Ox4 in a pleasant mood, denies pain, not in acute distress, on CIWA protocol. All questions answered. Orders initiated. Redness and odor noted on lower abdomen folds. Bruising on right arm noted as well.

## 2022-09-14 NOTE — ED Notes (Signed)
Patient transported to CT transport by nurse and tech on monitor

## 2022-09-14 NOTE — ED Provider Notes (Signed)
[place] EMERGENCY DEPARTMENT AT [facility] Provider Note   CSN: [id number] Arrival date & time: 09/14/22  4098     History Afib, DM, COPD Chief Complaint  Patient presents with   Francis Cardenas is a 72 y.o. male.  72 y.o male with a PMH of Afib, DM. COPD presents to the ED with a chief complaint of fall. Patient reports he was ambulating in his living room when he turned and felt dizzy. He reports falling on the ground, struck his head but did not lose consciousness. He has not ambulated since the accident occurred. EMS assisted him to the stretcher. He endorses pain along his entire head, neck, left hip and right ankle worse with any movement. He does endorse daily alcohol intake, with 12-14 beers daily but did not have any alcohol intake today. He does endorses his gait being off lately which has been a problem in the past but has now returned.   The history is provided by the patient and the EMS personnel.  Fall This is a new problem. Associated symptoms include headaches. Pertinent negatives include no chest pain, no abdominal pain and no shortness of breath.       Home Medications Prior to Admission medications   Medication Sig Start Date End Date Taking? Authorizing Provider  acetaminophen (TYLENOL) 325 MG tablet Take 2 tablets (650 mg total) by mouth every 6 (six) hours as needed. 09/27/21  Yes Tanda Rockers A, DO  albuterol (PROVENTIL HFA;VENTOLIN HFA) 108 (90 BASE) MCG/ACT inhaler Inhale 2 puffs into the lungs every 6 (six) hours as needed for wheezing.   Yes [provider]  allopurinol (ZYLOPRIM) 100 MG tablet Take 100 mg by mouth daily. 05/07/22  Yes [provider]  allopurinol (ZYLOPRIM) 300 MG tablet Take 1 tablet by mouth daily. 10/12/21  Yes [provider]  ALPRAZolam Prudy Feeler) 1 MG tablet Take 1 mg by mouth at bedtime. 05/24/22  Yes [provider]  amLODipine (NORVASC) 10 MG tablet Take 10 mg by mouth daily.  10/21/21  Yes [provider]  apixaban (ELIQUIS) 5 MG TABS tablet Take 1 tablet (5 mg total) by mouth 2 (two) times daily. 02/28/22  Yes Jodelle Red, MD  atorvastatin (LIPITOR) 80 MG tablet Take 40 mg by mouth daily with supper.   Yes [provider]  empagliflozin (JARDIANCE) 25 MG TABS tablet Take 12.5 mg by mouth 2 (two) times daily.   Yes [provider]  fluticasone-salmeterol (WIXELA INHUB) 250-50 MCG/ACT AEPB Inhale 1 puff into the lungs in the morning and at bedtime. 09/05/22  Yes Luciano Cutter, MD  gabapentin (NEURONTIN) 300 MG capsule Take 300 mg by mouth 3 (three) times daily.   Yes [provider]  hydrALAZINE (APRESOLINE) 25 MG tablet Take 25 mg by mouth daily. 10/21/21  Yes [provider]  Magnesium Oxide 420 MG TABS Take 420 mg by mouth daily with supper.   Yes [provider]  metoprolol succinate (TOPROL-XL) 25 MG 24 hr tablet Take 0.5 tablets (12.5 mg total) by mouth daily. 09/04/21  Yes Lonia Blood, MD  Multiple Vitamins-Minerals (MULTIVITAMIN WITH MINERALS) tablet Take 1 tablet by mouth every Monday, Wednesday, and Friday.   Yes [provider]  omeprazole (PRILOSEC) 20 MG capsule Take 20 mg by mouth daily with supper.   Yes [provider]  potassium chloride SA (KLOR-CON M) 20 MEQ tablet Take 1 tablet (20 mEq total) by mouth daily with supper.  05/20/21  Yes Dahal, Melina Schools, MD  spironolactone (ALDACTONE) 25 MG tablet Take 25 mg by mouth every morning.   Yes [provider]  thiamine (VITAMIN B-1) 100 MG tablet Take 100 mg by mouth daily.   Yes [provider]  Tiotropium Bromide Monohydrate (SPIRIVA RESPIMAT) 2.5 MCG/ACT AERS Inhale 2 puffs into the lungs daily. 09/05/22  Yes Luciano Cutter, MD  torsemide (DEMADEX) 20 MG tablet Take 20 mg by mouth 2 (two) times daily. 10/21/21  Yes [provider]  vitamin B-12 (CYANOCOBALAMIN) 500 MCG tablet Take 500 mcg by mouth  daily with supper.   Yes [provider]      Allergies    Oxcarbazepine and Zolpidem    Review of Systems   Review of Systems  Constitutional:  Negative for chills and fever.  HENT:  Negative for sore throat.   Respiratory:  Negative for shortness of breath.   Cardiovascular:  Negative for chest pain.  Gastrointestinal:  Negative for abdominal pain, nausea and vomiting.  Musculoskeletal:  Positive for arthralgias, back pain and neck pain.  Neurological:  Positive for headaches.  All other systems reviewed and are negative.   Physical Exam Updated Vital Signs BP (!) 142/77   Pulse 66   Temp 98 F (36.7 C) (Oral)   Resp 12   SpO2 99%  Physical Exam Vitals and nursing note reviewed.  Constitutional:      Appearance: Normal appearance.  HENT:     Head: Normocephalic and atraumatic.     Comments: No palpable goose eggs.No abrasions noted.     Mouth/Throat:     Mouth: Mucous membranes are dry.  Neck:     Comments: Pain with palpation of C7, and limited ROM due to pain.  Cardiovascular:     Rate and Rhythm: Rhythm irregular.  Pulmonary:     Effort: Pulmonary effort is normal.     Breath sounds: No wheezing or rales.     Comments: Lungs are decreased. Abdominal:     General: Abdomen is flat.     Tenderness: There is no abdominal tenderness.  Musculoskeletal:     Cervical back: Normal range of motion.     Left hip: Tenderness present. Decreased range of motion.     Comments: Limited ROM with LEFT leg extension, appears slightly shortened.   Skin:    General: Skin is warm and dry.  Neurological:     Mental Status: He is alert and oriented to person, place, and time.     ED Results / Procedures / Treatments   Labs (all labs ordered are listed, but only abnormal results are displayed) Labs Reviewed  CBC WITH DIFFERENTIAL/PLATELET - Abnormal; Notable for the following components:      Result Value   RBC 3.94 (*)    Hemoglobin 12.5 (*)    HCT 38.6 (*)     Abs Immature Granulocytes 0.15 (*)    All other components within normal limits  COMPREHENSIVE METABOLIC PANEL - Abnormal; Notable for the following components:   Sodium 124 (*)    Chloride 94 (*)    CO2 17 (*)    BUN 38 (*)    Creatinine, Ser 1.76 (*)    Calcium 10.5 (*)    GFR, Estimated 40 (*)    All other components within normal limits  ETHANOL - Abnormal; Notable for the following components:   Alcohol, Ethyl (B) 74 (*)    All other components within normal limits  PROTIME-INR - Abnormal; Notable  for the following components:   Prothrombin Time 16.7 (*)    INR 1.4 (*)    All other components within normal limits  URINALYSIS, ROUTINE W REFLEX MICROSCOPIC  BASIC METABOLIC PANEL  CBC    EKG EKG Interpretation  Date/Time:  Wednesday September 14 2022 09:38:38 EDT Ventricular Rate:  59 PR Interval:    QRS Duration: 121 QT Interval:  440 QTC Calculation: 436 R Axis:   75 Text Interpretation: Atrial fibrillation Nonspecific intraventricular conduction delay Probable anteroseptal infarct, old similar to previous Confirmed by Coralee Pesa [phone]) on 09/14/2022 9:56:04 AM  Radiology CT Thoracic Spine Wo Contrast  Result Date: 09/14/2022 CLINICAL DATA:  Fall EXAM: CT THORACIC AND LUMBAR SPINE WITHOUT CONTRAST TECHNIQUE: Multidetector CT imaging of the thoracic and lumbar spine was performed without contrast. Multiplanar CT image reconstructions were also generated. RADIATION DOSE REDUCTION: This exam was performed according to the departmental dose-optimization program which includes automated exposure control, adjustment of the mA and/or kV according to patient size and/or use of iterative reconstruction technique. COMPARISON:  CT lumbar spine 02/19/2021, no prior CT of the thoracic spine FINDINGS: CT THORACIC SPINE FINDINGS Alignment: Mild dextrocurvature. Straightening of the normal thoracic kyphosis. Vertebrae: No acute fracture or suspicious osseous lesion. Minimal chronic wedging  of T7-T9. Paraspinal and other soft tissues: Aortic atherosclerosis. Calcified ductus diverticulum. No focal pulmonary opacity or pleural effusion in the imaged lungs. Disc levels: Multilevel degenerative changes, with mild spinal canal stenosis at T10-T11. Mild neural foraminal narrowing at T1-T2 bilaterally. Moderate right neural foraminal narrowing at T10-T11 and mild left neural foraminal narrowing. CT LUMBAR SPINE FINDINGS Segmentation: 5 lumbar type vertebral bodies. Alignment: Mild levocurvature.  No significant listhesis. Vertebrae: No acute fracture or suspicious osseous lesion. Paraspinal and other soft tissues: Abdominal aortic atherosclerosis. Disc levels: Multilevel degenerative changes, with moderate spinal canal stenosis at L2-L3 and moderate to severe spinal canal stenosis at L3-L4. IMPRESSION: 1. No acute fracture or traumatic listhesis in the thoracic or lumbar spine. 2. Multilevel degenerative changes, with mild spinal canal stenosis at T10-T11, moderate spinal canal stenosis at L2-L3, and moderate to severe spinal canal stenosis. This could be further evaluated with an MRI if clinically indicated. 3. Aortic atherosclerosis. Aortic Atherosclerosis (ICD10-I70.0). Electronically Signed   By: Wiliam Ke M.D.   On: 09/14/2022 14:35   CT Lumbar Spine Wo Contrast  Result Date: 09/14/2022 CLINICAL DATA:  Fall EXAM: CT THORACIC AND LUMBAR SPINE WITHOUT CONTRAST TECHNIQUE: Multidetector CT imaging of the thoracic and lumbar spine was performed without contrast. Multiplanar CT image reconstructions were also generated. RADIATION DOSE REDUCTION: This exam was performed according to the departmental dose-optimization program which includes automated exposure control, adjustment of the mA and/or kV according to patient size and/or use of iterative reconstruction technique. COMPARISON:  CT lumbar spine 02/19/2021, no prior CT of the thoracic spine FINDINGS: CT THORACIC SPINE FINDINGS Alignment: Mild  dextrocurvature. Straightening of the normal thoracic kyphosis. Vertebrae: No acute fracture or suspicious osseous lesion. Minimal chronic wedging of T7-T9. Paraspinal and other soft tissues: Aortic atherosclerosis. Calcified ductus diverticulum. No focal pulmonary opacity or pleural effusion in the imaged lungs. Disc levels: Multilevel degenerative changes, with mild spinal canal stenosis at T10-T11. Mild neural foraminal narrowing at T1-T2 bilaterally. Moderate right neural foraminal narrowing at T10-T11 and mild left neural foraminal narrowing. CT LUMBAR SPINE FINDINGS Segmentation: 5 lumbar type vertebral bodies. Alignment: Mild levocurvature.  No significant listhesis. Vertebrae: No acute fracture or suspicious osseous lesion. Paraspinal and other soft tissues: Abdominal aortic atherosclerosis.  Disc levels: Multilevel degenerative changes, with moderate spinal canal stenosis at L2-L3 and moderate to severe spinal canal stenosis at L3-L4. IMPRESSION: 1. No acute fracture or traumatic listhesis in the thoracic or lumbar spine. 2. Multilevel degenerative changes, with mild spinal canal stenosis at T10-T11, moderate spinal canal stenosis at L2-L3, and moderate to severe spinal canal stenosis. This could be further evaluated with an MRI if clinically indicated. 3. Aortic atherosclerosis. Aortic Atherosclerosis (ICD10-I70.0). Electronically Signed   By: Wiliam Ke M.D.   On: 09/14/2022 14:35   DG Hip Unilat W or Wo Pelvis 2-3 Views Left  Result Date: 09/14/2022 CLINICAL DATA:  Fall this morning. EXAM: DG HIP (WITH OR WITHOUT PELVIS) 2-3V LEFT COMPARISON:  01/11/2022 FINDINGS: Prostate fiducials noted. Mild craniocaudad degenerative loss of articular space in both hips. No fracture or acute bony findings noted. IMPRESSION: 1. No acute findings. 2. Mild degenerative loss of articular space in both hips. Electronically Signed   By: Gaylyn Rong M.D.   On: 09/14/2022 11:14   DG Chest Portable 1  View  Result Date: 09/14/2022 CLINICAL DATA:  Fall, left hip pain, dizziness and blurred vision. EXAM: PORTABLE CHEST 1 VIEW COMPARISON:  06/17/2022 FINDINGS: The lungs appear clear. Cardiac and mediastinal contours normal. No pleural effusion identified. Mid and lower thoracic spondylosis. IMPRESSION: 1. No active cardiopulmonary disease is radiographically apparent. 2. Mid and lower thoracic spondylosis. Electronically Signed   By: Gaylyn Rong M.D.   On: 09/14/2022 11:12   DG Ankle Complete Right  Result Date: 09/14/2022 CLINICAL DATA:  Fall, right ankle pain EXAM: RIGHT ANKLE - COMPLETE 3+ VIEW COMPARISON:  01/11/2022 FINDINGS: Radiodensities associated with bandaging or sock noted along the heel and foot. Chronic ossicles below the medial malleolus similar to 8/20 2/23. These appear well corticated. Chronic lucent osteochondral lesion of the medial talar dome with sclerotic margin. No fracture or acute bony findings identified. Posterior spurring along the posterior subtalar joint. Dorsal spurring at the talonavicular articulation. Mild midfoot degenerative spurring. Faint linear calcifications project over the distal Achilles tendon and could reflect mild calcific tendinopathy. IMPRESSION: 1. No acute bony findings. 2. 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 3. Chronic ossicles below the medial malleolus likely from remote trauma. 4. Mild calcific tendinopathy of the distal Achilles tendon. 5. Degenerative findings in the midfoot and hindfoot. 6. Radiodensities associated with bandaging or sock noted along the heel and foot. Electronically Signed   By: Gaylyn Rong M.D.   On: 09/14/2022 11:11   CT HEAD WO CONTRAST ( )  Result Date: 09/14/2022 CLINICAL DATA:  Provided history: Head trauma, abnormal mental status. Neck trauma. Fall. EXAM: CT HEAD WITHOUT CONTRAST CT CERVICAL SPINE WITHOUT CONTRAST TECHNIQUE: Multidetector CT imaging of the head and cervical spine was  performed following the standard protocol without intravenous contrast. Multiplanar CT image reconstructions of the cervical spine were also generated. RADIATION DOSE REDUCTION: This exam was performed according to the departmental dose-optimization program which includes automated exposure control, adjustment of the mA and/or kV according to patient size and/or use of iterative reconstruction technique. COMPARISON:  Head CT 06/05/2020 brain MRI 12/16/2019. Cervical spine MRI 12/16/2019. FINDINGS: CT HEAD FINDINGS Brain: Generalized cerebral atrophy. Patchy and ill-defined hypoattenuation within the cerebral white matter, nonspecific but compatible with mild-to-moderate chronic small vessel ischemic disease. There is no acute intracranial hemorrhage. No demarcated cortical infarct. No extra-axial fluid collection. No evidence of an intracranial mass. No midline shift. Vascular: No hyperdense vessel.  Atherosclerotic calcifications. Skull: No fracture or  aggressive osseous lesion. Sinuses/Orbits: No mass or acute finding within the imaged orbits. Trace mucosal thickening within the bilateral ethmoid sinuses. CT CERVICAL SPINE FINDINGS Alignment: Straightening of the expected cervical lordosis. No significant spondylolisthesis. Skull base and vertebrae: The basion-dental and atlanto-dental intervals are maintained.No evidence of acute fracture to the cervical spine. Soft tissues and spinal canal: No prevertebral fluid or swelling. No visible canal hematoma. Disc levels: Cervical spondylosis with multilevel disc space narrowing, disc bulges/disc protrusions, posterior disc osteophyte complexes, uncovertebral hypertrophy and facet arthrosis. Multilevel spinal canal stenosis. Most notably, a posterior disc osteophyte complex contributes to suspected moderate spinal canal stenosis at C5-C6. Multilevel bony neural foraminal narrowing. Upper chest: No consolidation within the imaged lung apices. No visible pneumothorax.  IMPRESSION: CT head: 1.  No evidence of an acute intracranial abnormality. 2. Parenchymal atrophy and chronic small vessel ischemic disease. CT cervical spine: 1. No evidence of acute fracture to the cervical spine. 2. Nonspecific straightening of the expected cervical lordosis. 3. Cervical spondylosis, as described. Electronically Signed   By: Jackey Loge D.O.   On: 09/14/2022 10:27   CT Cervical Spine Wo Contrast  Result Date: 09/14/2022 CLINICAL DATA:  Provided history: Head trauma, abnormal mental status. Neck trauma. Fall. EXAM: CT HEAD WITHOUT CONTRAST CT CERVICAL SPINE WITHOUT CONTRAST TECHNIQUE: Multidetector CT imaging of the head and cervical spine was performed following the standard protocol without intravenous contrast. Multiplanar CT image reconstructions of the cervical spine were also generated. RADIATION DOSE REDUCTION: This exam was performed according to the departmental dose-optimization program which includes automated exposure control, adjustment of the mA and/or kV according to patient size and/or use of iterative reconstruction technique. COMPARISON:  Head CT 06/05/2020 brain MRI 12/16/2019. Cervical spine MRI 12/16/2019. FINDINGS: CT HEAD FINDINGS Brain: Generalized cerebral atrophy. Patchy and ill-defined hypoattenuation within the cerebral white matter, nonspecific but compatible with mild-to-moderate chronic small vessel ischemic disease. There is no acute intracranial hemorrhage. No demarcated cortical infarct. No extra-axial fluid collection. No evidence of an intracranial mass. No midline shift. Vascular: No hyperdense vessel.  Atherosclerotic calcifications. Skull: No fracture or aggressive osseous lesion. Sinuses/Orbits: No mass or acute finding within the imaged orbits. Trace mucosal thickening within the bilateral ethmoid sinuses. CT CERVICAL SPINE FINDINGS Alignment: Straightening of the expected cervical lordosis. No significant spondylolisthesis. Skull base and vertebrae:  The basion-dental and atlanto-dental intervals are maintained.No evidence of acute fracture to the cervical spine. Soft tissues and spinal canal: No prevertebral fluid or swelling. No visible canal hematoma. Disc levels: Cervical spondylosis with multilevel disc space narrowing, disc bulges/disc protrusions, posterior disc osteophyte complexes, uncovertebral hypertrophy and facet arthrosis. Multilevel spinal canal stenosis. Most notably, a posterior disc osteophyte complex contributes to suspected moderate spinal canal stenosis at C5-C6. Multilevel bony neural foraminal narrowing. Upper chest: No consolidation within the imaged lung apices. No visible pneumothorax. IMPRESSION: CT head: 1.  No evidence of an acute intracranial abnormality. 2. Parenchymal atrophy and chronic small vessel ischemic disease. CT cervical spine: 1. No evidence of acute fracture to the cervical spine. 2. Nonspecific straightening of the expected cervical lordosis. 3. Cervical spondylosis, as described. Electronically Signed   By: Jackey Loge D.O.   On: 09/14/2022 10:27    Procedures Procedures    Medications Ordered in ED Medications  ibuprofen (ADVIL) tablet 600 mg (600 mg Oral Not Given 09/14/22 1552)  allopurinol (ZYLOPRIM) tablet 100 mg (has no administration in time range)  allopurinol (ZYLOPRIM) tablet 300 mg (has no administration in time range)  amLODipine (NORVASC)  tablet 10 mg (has no administration in time range)  atorvastatin (LIPITOR) tablet 40 mg (has no administration in time range)  metoprolol succinate (TOPROL-XL) 24 hr tablet 12.5 mg (has no administration in time range)  spironolactone (ALDACTONE) tablet 25 mg (has no administration in time range)  torsemide (DEMADEX) tablet 20 mg (has no administration in time range)  ALPRAZolam (XANAX) tablet 1 mg (has no administration in time range)  Magnesium Oxide TABS 420 mg (has no administration in time range)  pantoprazole (PROTONIX) EC tablet 40 mg (has no  administration in time range)  apixaban (ELIQUIS) tablet 5 mg (has no administration in time range)  gabapentin (NEURONTIN) capsule 300 mg (has no administration in time range)  mometasone-formoterol (DULERA) 200-5 MCG/ACT inhaler 2 puff (has no administration in time range)  Tiotropium Bromide Monohydrate AERS 2 puff (has no administration in time range)  insulin aspart (novoLOG) injection 0-15 Units (has no administration in time range)  insulin aspart (novoLOG) injection 0-5 Units (has no administration in time range)  acetaminophen (TYLENOL) tablet 650 mg (has no administration in time range)    Or  acetaminophen (TYLENOL) suppository 650 mg (has no administration in time range)  oxyCODONE (Oxy IR/ROXICODONE) immediate release tablet 5 mg (has no administration in time range)  morphine (PF) 2 MG/ML injection 2 mg (has no administration in time range)  docusate sodium (COLACE) capsule 100 mg (has no administration in time range)  polyethylene glycol (MIRALAX / GLYCOLAX) packet 17 g (has no administration in time range)  bisacodyl (DULCOLAX) EC tablet 5 mg (has no administration in time range)  ondansetron (ZOFRAN) tablet 4 mg (has no administration in time range)    Or  ondansetron (ZOFRAN) injection 4 mg (has no administration in time range)  hydrALAZINE (APRESOLINE) injection 5 mg (has no administration in time range)  LORazepam (ATIVAN) tablet 1-4 mg (has no administration in time range)    Or  LORazepam (ATIVAN) tablet 1 mg (has no administration in time range)  thiamine (VITAMIN B1) tablet 100 mg (has no administration in time range)    Or  thiamine (VITAMIN B1) injection 100 mg (has no administration in time range)  folic acid (FOLVITE) tablet 1 mg (has no administration in time range)  multivitamin with minerals tablet 1 tablet (has no administration in time range)  nystatin (MYCOSTATIN/NYSTOP) topical powder ( Topical Given 09/14/22 1543)    ED Course/ Medical Decision Making/  A&P                             Medical Decision Making Amount and/or Complexity of Data Reviewed Labs: ordered. Radiology: ordered.  Risk Prescription drug management. Decision regarding hospitalization.    This patient presents to the ED for concern of fall, this involves a number of treatment options, and is a complaint that carries with it a high risk of complications and morbidity.  The differential diagnosis includes  Syncope versus mechanical fall.   Co morbidities: Discussed in HPI   Brief History:  See HPI.   EMR reviewed including pt PMHx, past surgical history and past visits to ER.   See HPI for more details   Lab Tests:  I ordered and independently interpreted labs.  The pertinent results include:    Labs notable for CBC with no leukocytosis, hemoglobin is at his baseline. CMP sodium is low at 124 worse than his baseline. PT INR is baseline. Ethanol is now 74.   Imaging Studies:  CT HEAD/Neck showed: 1.  No evidence of an acute intracranial abnormality.  2. Parenchymal atrophy and chronic small vessel ischemic disease.    CT cervical spine:    1. No evidence of acute fracture to the cervical spine.  2. Nonspecific straightening of the expected cervical lordosis.  3. Cervical spondylosis, as described.   CT thoracic and lumbar: 1. No acute fracture or traumatic listhesis in the thoracic or  lumbar spine.  2. Multilevel degenerative changes, with mild spinal canal stenosis  at T10-T11, moderate spinal canal stenosis at L2-L3, and moderate to  severe spinal canal stenosis. This could be further evaluated with  an MRI if clinically indicated.  3. Aortic atherosclerosis.    Aortic Atherosclerosis (ICD10-I70.0).   1. No acute findings.  2. Mild degenerative loss of articular space in both hips.   The rest of her xrays without any acute findings.   Cardiac Monitoring:  The patient was maintained on a cardiac monitor.  I personally viewed and  interpreted the cardiac monitored which showed an underlying rhythm of: irregularly irregular  EKG non-ischemic   Medicines ordered:  I ordered medication including iburpofen   for pain control Reevaluation of the patient after these medicines showed that the patient stayed the same I have reviewed the patients home medicines and have made adjustments as needed  Reevaluation:  After the interventions noted above I re-evaluated patient and found that they have :improved  Social Determinants of Health:  The patient's social determinants of health were a factor in the care of this patient  Problem List / ED Course:  Patient underlying history of alcohol abuse presents to the ED with a chief complaint of fall after dizziness.  Reports he was ambulating around his home and suddenly he turned around and fell dizzy struck his head and is anticoagulated on Eliquis due to underlying A-fib.  Reports problems with his gait over the last several months however they have now worsened over the last several days.  He is a daily drinker drinking 10-12 beers daily but reports he does not go into withdrawals.  On evaluation today patient is overall hemodynamically stable, no signs of obvious trauma noted to the head, no goose eggs palpated.  Moves upper and lower extremities adequately.  Labs are remarkable for hyponatremia with a sodium of 124, which is likely underlying due to his alcohol abuse, but has never been this low over the last several months.  Creatinine level is also remarkable for a mild AKI with worsening kidney function.  CBC with no leukocytosis, hemoglobin stable.  His ethanol level was 74, he ambulated with nursing staff however is unsteady on his gait and appears to be dizzy.  X-rays along with CTs of his head, cervical spine, thoracic spine, lumbar spine were obtained which did not show any acute finding.  X-rays of his chest, pelvis, right ankle were obtained without any acute findings as  well.  Due to patient's underlying condition such as hyponatremia, anticoagulated on Eliquis, with unsteady gait and likely symptomatic from his low sodium I do feel that he meets admission at this time for further management. Discussed this case with Dr. Kevan Ny hospitalist service, appreciate her assistance will admit patient for further management.  Dispostion:  After consideration of the diagnostic results and the patients response to treatment, I feel that the patent would benefit from admission for further management.     Portions of this note were generated with Scientist, clinical (histocompatibility and immunogenetics). Dictation errors may occur despite  best attempts at proofreading.   Final Clinical Impression(s) / ED Diagnoses Final diagnoses:  Fall, initial encounter  Hyponatremia    Rx / DC Orders ED Discharge Orders     None         Claude Manges, PA-C 09/14/22 1730    Rozelle Logan, DO 09/15/22 [phone]

## 2022-09-14 NOTE — ED Triage Notes (Signed)
Patient BIB EMS from home for a fall this morning. 12-14 beers since 0100 today. Balance has been off lately. No drinking on yesterday. Dizziness/blurred vision. Neg stroke. Patient on blood thinner. Denies hitting head or LOC. CBG 109. C/O right hip and ankle pain. Back pain with H/O back pain. C-spine cleared.No deformities.

## 2022-09-14 NOTE — ED Notes (Addendum)
Patient transported to X-ray with nurse and tech

## 2022-09-14 NOTE — ED Notes (Signed)
Incontinent care given, bed linens changed. Redness noted under ABD folds with foul odor. Will continue to monitor

## 2022-09-14 NOTE — ED Notes (Signed)
Ambulated this pt. Walked up and down the hall, only complaint was right ankle and knee pain with slight dizziness towards the end of the walk.

## 2022-09-14 NOTE — ED Notes (Addendum)
ED TO INPATIENT HANDOFF REPORT  ED Nurse Name and Phone #: [phone]   S Name/Age/Gender Francis Cardenas 72 y.o. male Room/Bed: 042C/042C  Code Status   Code Status: Full Code  Home/SNF/Other Home Patient oriented to: self, place, time, and situation Is this baseline? Yes   Triage Complete: Triage complete  Chief Complaint Fall at home, initial encounter [W19.Lorne Skeens, Y92.009]  Triage Note Patient BIB EMS from home for a fall this morning. 12-14 beers since 0100 today. Balance has been off lately. No drinking on yesterday. Dizziness/blurred vision. Neg stroke. Patient on blood thinner. Denies hitting head or LOC. CBG 109. C/O right hip and ankle pain. Back pain with H/O back pain. C-spine cleared.No deformities.   Allergies Allergies  Allergen Reactions   Oxcarbazepine Other (See Comments)    Dizziness/ lightheaded   Zolpidem Nausea Only    Level of Care/Admitting Diagnosis ED Disposition     ED Disposition  Admit   Condition  --   Comment  Hospital Area: [facility] [[id number]]  Level of Care: Med-Surg [16]  May place patient in observation at [facility] or [facility] if equivalent level of care is available:: Yes  Covid Evaluation: Asymptomatic - no recent exposure (last 10 days) testing not required  Diagnosis: Fall at home, initial encounter [[id number]]  Admitting Physician: Jonah Blue [[id number]]  Attending Physician: Jonah Blue [[id number]]          B Medical/Surgery History Past Medical History:  Diagnosis Date   Alcohol dependence    BPH (benign prostatic hyperplasia)    Cancer    prostate   COPD (chronic obstructive pulmonary disease)    Diabetes mellitus without complication    Hypercholesteremia    Hypertension    OSA on CPAP    Past Surgical History:  Procedure Laterality Date   ACHILLES TENDON REPAIR Left    KNEE ARTHROSCOPY Left    RIGHT/LEFT HEART CATH AND CORONARY ANGIOGRAPHY N/A 04/09/2019   Procedure: RIGHT/LEFT HEART  CATH AND CORONARY ANGIOGRAPHY;  Surgeon: [place]az[place], Peter M, MD;  Location: [facility] INVASIVE CV LAB;  Service: Cardiovascular;  Laterality: N/A;   TONSILLECTOMY     WRIST FRACTURE SURGERY Left      A IV Location/Drains/Wounds Patient Lines/Drains/Airways Status     Active Line/Drains/Airways     Name Placement date Placement time Site Days   Peripheral IV 09/14/22 22 G Posterior;Left Hand 09/14/22  0948  Hand  less than 1   Pressure Injury 05/15/21 Buttocks Left Stage 2 -  Partial thickness loss of dermis presenting as a shallow open injury with a red, pink wound bed without slough. bruising around the site vs. deep tissue injury 05/15/21  0300  -- 487   Wound / Incision (Open or Dehisced) 06/15/22 Irritant Dermatitis (Moisture Associated Skin Damage) Groin Right;Left 06/15/22  1930  Groin  91            Intake/Output Last 24 hours  Intake/Output Summary (Last 24 hours) at 09/14/2022 2027 Last data filed at 09/14/2022 1943 Gross per 24 hour  Intake --  Output 400 ml  Net -400 ml    Labs/Imaging Results for orders placed or performed during the hospital encounter of 09/14/22 (from the past 48 hour(s))  CBC with Differential     Status: Abnormal   Collection Time: 09/14/22  9:40 AM  Result Value Ref Range   WBC 8.0 4.0 - 10.5 K/uL   RBC 3.94 (L) 4.22 - 5.81 MIL/uL   Hemoglobin 12.5 (L)  13.0 - 17.0 g/dL   HCT 40.9 (L) 81.1 - 91.4 %   MCV 98.0 80.0 - 100.0 fL   MCH 31.7 26.0 - 34.0 pg   MCHC 32.4 30.0 - 36.0 g/dL   RDW 78.2 95.6 - 21.3 %   Platelets 167 150 - 400 K/uL   nRBC 0.0 0.0 - 0.2 %   Neutrophils Relative % 67 %   Neutro Abs 5.5 1.7 - 7.7 K/uL   Lymphocytes Relative 18 %   Lymphs Abs 1.4 0.7 - 4.0 K/uL   Monocytes Relative 7 %   Monocytes Absolute 0.5 0.1 - 1.0 K/uL   Eosinophils Relative 5 %   Eosinophils Absolute 0.4 0.0 - 0.5 K/uL   Basophils Relative 1 %   Basophils Absolute 0.1 0.0 - 0.1 K/uL   Immature Granulocytes 2 %   Abs Immature Granulocytes 0.15 (H)  0.00 - 0.07 K/uL    Comment: Performed at [facility], [address] [place], [place] [address]  Comprehensive metabolic panel     Status: Abnormal   Collection Time: 09/14/22  9:40 AM  Result Value Ref Range   Sodium 124 (L) 135 - 145 mmol/L   Potassium 4.8 3.5 - 5.1 mmol/L   Chloride 94 (L) 98 - 111 mmol/L   CO2 17 (L) 22 - 32 mmol/L   Glucose, Bld 93 70 - 99 mg/dL    Comment: Glucose reference range applies only to samples taken after fasting for at least 8 hours.   BUN 38 (H) 8 - 23 mg/dL   Creatinine, Ser 8.46 (H) 0.61 - 1.24 mg/dL   Calcium 96.2 (H) 8.9 - 10.3 mg/dL   Total Protein 6.7 6.5 - 8.1 g/dL   Albumin 4.1 3.5 - 5.0 g/dL   AST 23 15 - 41 U/L   ALT 24 0 - 44 U/L   Alkaline Phosphatase 82 38 - 126 U/L   Total Bilirubin 1.0 0.3 - 1.2 mg/dL   GFR, Estimated 40 (L) >60 mL/min    Comment: (NOTE) Calculated using the CKD-EPI Creatinine Equation (2021)    Anion gap 13 5 - 15    Comment: Performed at [facility], [address] [place], [place] [address]  Ethanol     Status: Abnormal   Collection Time: 09/14/22  9:40 AM  Result Value Ref Range   Alcohol, Ethyl (B) 74 (H) <10 mg/dL    Comment: (NOTE) Lowest detectable limit for serum alcohol is 10 mg/dL.  For medical purposes only. Performed at [facility], [address] [place], [place] [address]   Protime-INR     Status: Abnormal   Collection Time: 09/14/22  9:40 AM  Result Value Ref Range   Prothrombin Time 16.7 (H) 11.4 - 15.2 seconds   INR 1.4 (H) 0.8 - 1.2    Comment: (NOTE) INR goal varies based on device and disease states. Performed at [facility], [address], [place], [place] [address]    CT Thoracic Spine Wo Contrast  Result Date: 09/14/2022 CLINICAL DATA:  Fall EXAM: CT THORACIC AND LUMBAR SPINE WITHOUT CONTRAST TECHNIQUE: Multidetector CT imaging of the thoracic and lumbar spine was performed without contrast. Multiplanar CT image reconstructions were also  generated. RADIATION DOSE REDUCTION: This exam was performed according to the departmental dose-optimization program which includes automated exposure control, adjustment of the mA and/or kV according to patient size and/or use of iterative reconstruction technique. COMPARISON:  CT lumbar spine 02/19/2021, no prior  CT of the thoracic spine FINDINGS: CT THORACIC SPINE FINDINGS Alignment: Mild dextrocurvature. Straightening of the normal thoracic kyphosis. Vertebrae: No acute fracture or suspicious osseous lesion. Minimal chronic wedging of T7-T9. Paraspinal and other soft tissues: Aortic atherosclerosis. Calcified ductus diverticulum. No focal pulmonary opacity or pleural effusion in the imaged lungs. Disc levels: Multilevel degenerative changes, with mild spinal canal stenosis at T10-T11. Mild neural foraminal narrowing at T1-T2 bilaterally. Moderate right neural foraminal narrowing at T10-T11 and mild left neural foraminal narrowing. CT LUMBAR SPINE FINDINGS Segmentation: 5 lumbar type vertebral bodies. Alignment: Mild levocurvature.  No significant listhesis. Vertebrae: No acute fracture or suspicious osseous lesion. Paraspinal and other soft tissues: Abdominal aortic atherosclerosis. Disc levels: Multilevel degenerative changes, with moderate spinal canal stenosis at L2-L3 and moderate to severe spinal canal stenosis at L3-L4. IMPRESSION: 1. No acute fracture or traumatic listhesis in the thoracic or lumbar spine. 2. Multilevel degenerative changes, with mild spinal canal stenosis at T10-T11, moderate spinal canal stenosis at L2-L3, and moderate to severe spinal canal stenosis. This could be further evaluated with an MRI if clinically indicated. 3. Aortic atherosclerosis. Aortic Atherosclerosis (ICD10-I70.0). Electronically Signed   By: Wiliam Ke M.D.   On: 09/14/2022 14:35   CT Lumbar Spine Wo Contrast  Result Date: 09/14/2022 CLINICAL DATA:  Fall EXAM: CT THORACIC AND LUMBAR SPINE WITHOUT CONTRAST  TECHNIQUE: Multidetector CT imaging of the thoracic and lumbar spine was performed without contrast. Multiplanar CT image reconstructions were also generated. RADIATION DOSE REDUCTION: This exam was performed according to the departmental dose-optimization program which includes automated exposure control, adjustment of the mA and/or kV according to patient size and/or use of iterative reconstruction technique. COMPARISON:  CT lumbar spine 02/19/2021, no prior CT of the thoracic spine FINDINGS: CT THORACIC SPINE FINDINGS Alignment: Mild dextrocurvature. Straightening of the normal thoracic kyphosis. Vertebrae: No acute fracture or suspicious osseous lesion. Minimal chronic wedging of T7-T9. Paraspinal and other soft tissues: Aortic atherosclerosis. Calcified ductus diverticulum. No focal pulmonary opacity or pleural effusion in the imaged lungs. Disc levels: Multilevel degenerative changes, with mild spinal canal stenosis at T10-T11. Mild neural foraminal narrowing at T1-T2 bilaterally. Moderate right neural foraminal narrowing at T10-T11 and mild left neural foraminal narrowing. CT LUMBAR SPINE FINDINGS Segmentation: 5 lumbar type vertebral bodies. Alignment: Mild levocurvature.  No significant listhesis. Vertebrae: No acute fracture or suspicious osseous lesion. Paraspinal and other soft tissues: Abdominal aortic atherosclerosis. Disc levels: Multilevel degenerative changes, with moderate spinal canal stenosis at L2-L3 and moderate to severe spinal canal stenosis at L3-L4. IMPRESSION: 1. No acute fracture or traumatic listhesis in the thoracic or lumbar spine. 2. Multilevel degenerative changes, with mild spinal canal stenosis at T10-T11, moderate spinal canal stenosis at L2-L3, and moderate to severe spinal canal stenosis. This could be further evaluated with an MRI if clinically indicated. 3. Aortic atherosclerosis. Aortic Atherosclerosis (ICD10-I70.0). Electronically Signed   By: Wiliam Ke M.D.   On:  09/14/2022 14:35   DG Hip Unilat W or Wo Pelvis 2-3 Views Left  Result Date: 09/14/2022 CLINICAL DATA:  Fall this morning. EXAM: DG HIP (WITH OR WITHOUT PELVIS) 2-3V LEFT COMPARISON:  01/11/2022 FINDINGS: Prostate fiducials noted. Mild craniocaudad degenerative loss of articular space in both hips. No fracture or acute bony findings noted. IMPRESSION: 1. No acute findings. 2. Mild degenerative loss of articular space in both hips. Electronically Signed   By: Gaylyn Rong M.D.   On: 09/14/2022 11:14   DG Chest Portable 1 View  Result Date: 09/14/2022 CLINICAL  DATA:  Fall, left hip pain, dizziness and blurred vision. EXAM: PORTABLE CHEST 1 VIEW COMPARISON:  06/17/2022 FINDINGS: The lungs appear clear. Cardiac and mediastinal contours normal. No pleural effusion identified. Mid and lower thoracic spondylosis. IMPRESSION: 1. No active cardiopulmonary disease is radiographically apparent. 2. Mid and lower thoracic spondylosis. Electronically Signed   By: Gaylyn Rong M.D.   On: 09/14/2022 11:12   DG Ankle Complete Right  Result Date: 09/14/2022 CLINICAL DATA:  Fall, right ankle pain EXAM: RIGHT ANKLE - COMPLETE 3+ VIEW COMPARISON:  01/11/2022 FINDINGS: Radiodensities associated with bandaging or sock noted along the heel and foot. Chronic ossicles below the medial malleolus similar to 8/20 2/23. These appear well corticated. Chronic lucent osteochondral lesion of the medial talar dome with sclerotic margin. No fracture or acute bony findings identified. Posterior spurring along the posterior subtalar joint. Dorsal spurring at the talonavicular articulation. Mild midfoot degenerative spurring. Faint linear calcifications project over the distal Achilles tendon and could reflect mild calcific tendinopathy. IMPRESSION: 1. No acute bony findings. 2. 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 3. Chronic ossicles below the medial malleolus likely from remote trauma. 4. Mild calcific  tendinopathy of the distal Achilles tendon. 5. Degenerative findings in the midfoot and hindfoot. 6. Radiodensities associated with bandaging or sock noted along the heel and foot. Electronically Signed   By: Gaylyn Rong M.D.   On: 09/14/2022 11:11   CT HEAD WO CONTRAST ( )  Result Date: 09/14/2022 CLINICAL DATA:  Provided history: Head trauma, abnormal mental status. Neck trauma. Fall. EXAM: CT HEAD WITHOUT CONTRAST CT CERVICAL SPINE WITHOUT CONTRAST TECHNIQUE: Multidetector CT imaging of the head and cervical spine was performed following the standard protocol without intravenous contrast. Multiplanar CT image reconstructions of the cervical spine were also generated. RADIATION DOSE REDUCTION: This exam was performed according to the departmental dose-optimization program which includes automated exposure control, adjustment of the mA and/or kV according to patient size and/or use of iterative reconstruction technique. COMPARISON:  Head CT 06/05/2020 brain MRI 12/16/2019. Cervical spine MRI 12/16/2019. FINDINGS: CT HEAD FINDINGS Brain: Generalized cerebral atrophy. Patchy and ill-defined hypoattenuation within the cerebral white matter, nonspecific but compatible with mild-to-moderate chronic small vessel ischemic disease. There is no acute intracranial hemorrhage. No demarcated cortical infarct. No extra-axial fluid collection. No evidence of an intracranial mass. No midline shift. Vascular: No hyperdense vessel.  Atherosclerotic calcifications. Skull: No fracture or aggressive osseous lesion. Sinuses/Orbits: No mass or acute finding within the imaged orbits. Trace mucosal thickening within the bilateral ethmoid sinuses. CT CERVICAL SPINE FINDINGS Alignment: Straightening of the expected cervical lordosis. No significant spondylolisthesis. Skull base and vertebrae: The basion-dental and atlanto-dental intervals are maintained.No evidence of acute fracture to the cervical spine. Soft tissues and  spinal canal: No prevertebral fluid or swelling. No visible canal hematoma. Disc levels: Cervical spondylosis with multilevel disc space narrowing, disc bulges/disc protrusions, posterior disc osteophyte complexes, uncovertebral hypertrophy and facet arthrosis. Multilevel spinal canal stenosis. Most notably, a posterior disc osteophyte complex contributes to suspected moderate spinal canal stenosis at C5-C6. Multilevel bony neural foraminal narrowing. Upper chest: No consolidation within the imaged lung apices. No visible pneumothorax. IMPRESSION: CT head: 1.  No evidence of an acute intracranial abnormality. 2. Parenchymal atrophy and chronic small vessel ischemic disease. CT cervical spine: 1. No evidence of acute fracture to the cervical spine. 2. Nonspecific straightening of the expected cervical lordosis. 3. Cervical spondylosis, as described. Electronically Signed   By: Jackey Loge D.O.   On: 09/14/2022  10:27   CT Cervical Spine Wo Contrast  Result Date: 09/14/2022 CLINICAL DATA:  Provided history: Head trauma, abnormal mental status. Neck trauma. Fall. EXAM: CT HEAD WITHOUT CONTRAST CT CERVICAL SPINE WITHOUT CONTRAST TECHNIQUE: Multidetector CT imaging of the head and cervical spine was performed following the standard protocol without intravenous contrast. Multiplanar CT image reconstructions of the cervical spine were also generated. RADIATION DOSE REDUCTION: This exam was performed according to the departmental dose-optimization program which includes automated exposure control, adjustment of the mA and/or kV according to patient size and/or use of iterative reconstruction technique. COMPARISON:  Head CT 06/05/2020 brain MRI 12/16/2019. Cervical spine MRI 12/16/2019. FINDINGS: CT HEAD FINDINGS Brain: Generalized cerebral atrophy. Patchy and ill-defined hypoattenuation within the cerebral white matter, nonspecific but compatible with mild-to-moderate chronic small vessel ischemic disease. There is no  acute intracranial hemorrhage. No demarcated cortical infarct. No extra-axial fluid collection. No evidence of an intracranial mass. No midline shift. Vascular: No hyperdense vessel.  Atherosclerotic calcifications. Skull: No fracture or aggressive osseous lesion. Sinuses/Orbits: No mass or acute finding within the imaged orbits. Trace mucosal thickening within the bilateral ethmoid sinuses. CT CERVICAL SPINE FINDINGS Alignment: Straightening of the expected cervical lordosis. No significant spondylolisthesis. Skull base and vertebrae: The basion-dental and atlanto-dental intervals are maintained.No evidence of acute fracture to the cervical spine. Soft tissues and spinal canal: No prevertebral fluid or swelling. No visible canal hematoma. Disc levels: Cervical spondylosis with multilevel disc space narrowing, disc bulges/disc protrusions, posterior disc osteophyte complexes, uncovertebral hypertrophy and facet arthrosis. Multilevel spinal canal stenosis. Most notably, a posterior disc osteophyte complex contributes to suspected moderate spinal canal stenosis at C5-C6. Multilevel bony neural foraminal narrowing. Upper chest: No consolidation within the imaged lung apices. No visible pneumothorax. IMPRESSION: CT head: 1.  No evidence of an acute intracranial abnormality. 2. Parenchymal atrophy and chronic small vessel ischemic disease. CT cervical spine: 1. No evidence of acute fracture to the cervical spine. 2. Nonspecific straightening of the expected cervical lordosis. 3. Cervical spondylosis, as described. Electronically Signed   By: Jackey Loge D.O.   On: 09/14/2022 10:27    Pending Labs Unresulted Labs (From admission, onward)     Start     Ordered   09/15/22 0500  Basic metabolic panel  Tomorrow morning,   R        09/14/22 1719   09/15/22 0500  CBC  Tomorrow morning,   R        09/14/22 1719   09/14/22 1310  Urinalysis, Routine w reflex microscopic -Urine, Clean Catch  Once,   URGENT        Question:  Specimen Source  Answer:  Urine, Clean Catch   09/14/22 1309            Vitals/Pain Today's Vitals   09/14/22 1755 09/14/22 1909 09/14/22 1911 09/14/22 1912  BP:  (!) 144/55    Pulse:   73   Resp:   19   Temp: 98.1 F (36.7 C)     TempSrc: Oral     SpO2:   100%   Weight:      Height:      PainSc:    4     Isolation Precautions No active isolations  Medications Medications  ibuprofen (ADVIL) tablet 600 mg (600 mg Oral Not Given 09/14/22 1552)  amLODipine (NORVASC) tablet 10 mg (has no administration in time range)  atorvastatin (LIPITOR) tablet 40 mg (40 mg Oral Given 09/14/22 1801)  metoprolol succinate (TOPROL-XL) 24 hr  tablet 12.5 mg (has no administration in time range)  ALPRAZolam (XANAX) tablet 1 mg (has no administration in time range)  magnesium oxide (MAG-OX) tablet 400 mg (400 mg Oral Given 09/14/22 1804)  pantoprazole (PROTONIX) EC tablet 40 mg (40 mg Oral Given 09/14/22 1801)  apixaban (ELIQUIS) tablet 5 mg (has no administration in time range)  gabapentin (NEURONTIN) capsule 300 mg (300 mg Oral Given 09/14/22 1801)  mometasone-formoterol (DULERA) 200-5 MCG/ACT inhaler 2 puff (has no administration in time range)  Tiotropium Bromide Monohydrate AERS 2 puff (has no administration in time range)  insulin aspart (novoLOG) injection 0-15 Units (has no administration in time range)  insulin aspart (novoLOG) injection 0-5 Units (has no administration in time range)  acetaminophen (TYLENOL) tablet 650 mg (has no administration in time range)    Or  acetaminophen (TYLENOL) suppository 650 mg (has no administration in time range)  oxyCODONE (Oxy IR/ROXICODONE) immediate release tablet 5 mg (has no administration in time range)  morphine (PF) 2 MG/ML injection 2 mg (has no administration in time range)  docusate sodium (COLACE) capsule 100 mg (has no administration in time range)  polyethylene glycol (MIRALAX / GLYCOLAX) packet 17 g (has no administration in  time range)  bisacodyl (DULCOLAX) EC tablet 5 mg (has no administration in time range)  ondansetron (ZOFRAN) tablet 4 mg (has no administration in time range)    Or  ondansetron (ZOFRAN) injection 4 mg (has no administration in time range)  hydrALAZINE (APRESOLINE) injection 5 mg (has no administration in time range)  LORazepam (ATIVAN) tablet 1-4 mg (has no administration in time range)    Or  LORazepam (ATIVAN) tablet 1 mg (has no administration in time range)  thiamine (VITAMIN B1) tablet 100 mg ( Oral See Alternative 09/14/22 1756)    Or  thiamine (VITAMIN B1) injection 100 mg (100 mg Intravenous Given 09/14/22 1756)  folic acid (FOLVITE) tablet 1 mg (1 mg Oral Given 09/14/22 1804)  multivitamin with minerals tablet 1 tablet (1 tablet Oral Given 09/14/22 1801)  albuterol (PROVENTIL) (2.5 MG/3ML) 0.083% nebulizer solution 2.5 mg (has no administration in time range)  lactated ringers infusion ( Intravenous New Bag/Given 09/14/22 1800)  allopurinol (ZYLOPRIM) tablet 400 mg (has no administration in time range)  nystatin (MYCOSTATIN/NYSTOP) topical powder ( Topical Given 09/14/22 1543)    Mobility       Focused Assessments Cardiac Assessment Handoff:  Cardiac Rhythm: Normal sinus rhythm Lab Results  Component Value Date   TROPONINI 0.11 (HH) 01/29/2018   No results found for: "DDIMER" Does the Patient currently have chest pain? No    R Recommendations: See Admitting Provider Note  Report given to:   Additional Notes:  Please call with any questions

## 2022-09-14 NOTE — H&P (Signed)
History and Physical    Patient: Francis Cardenas ZOX:[id number] DOB: 1950-08-24 DOA: 09/14/2022 DOS: the patient was seen and examined on 09/14/2022 PCP: Benjiman Core, MD  Patient coming from: Home - lives with wife; NOK: Wife, Francis Cardenas, [phone]   Chief Complaint: Fall  HPI: Francis Cardenas is a 72 y.o. male with medical history significant of BPH, HTN, DM, HLD, alcohol dependence, COPD, OSA on CPAP, afib on Eliquis, and prostate CA presenting with a fall. He reports that he has balance issues, recently better although he used to fall pretty frequently.  Yesterday, he told his wife that he was trying to walk across the room and was light-headed and dizzy.  He stumbled against the wall, having balance issues.  He went to bed about 10pm, got up about 0130 and started watching tv.  Same issues as yesterday but today with a fall.  He came close to hitting his head but he did not.  He had 5-6 beers this AM prior to falling.  He fell about 730AM.  His neck and shoulders were hurting.  His hands were cut up.      ER Course:  Got dizzy, fell, hit head.  On Eliquis.  Drinks 12-15 beers daily, he "does not withdraw."  Na++ 124, mild AKI.  Scans negative.  Still unsteady with ambulation.     Review of Systems: As mentioned in the history of present illness. All other systems reviewed and are negative. Past Medical History:  Diagnosis Date   Alcohol dependence    BPH (benign prostatic hyperplasia)    Cancer    prostate   COPD (chronic obstructive pulmonary disease)    Diabetes mellitus without complication    Hypercholesteremia    Hypertension    OSA on CPAP    Past Surgical History:  Procedure Laterality Date   ACHILLES TENDON REPAIR Left    KNEE ARTHROSCOPY Left    RIGHT/LEFT HEART CATH AND CORONARY ANGIOGRAPHY N/A 04/09/2019   Procedure: RIGHT/LEFT HEART CATH AND CORONARY ANGIOGRAPHY;  Surgeon: [place], Peter M, MD;  Location: [facility];  Service:  Cardiovascular;  Laterality: N/A;   TONSILLECTOMY     WRIST FRACTURE SURGERY Left    Social History:  reports that he quit smoking about 44 years ago. His smoking use included cigarettes. He has a 13.00 pack-year smoking history. He has never been exposed to tobacco smoke. He has never used smokeless tobacco. He reports current alcohol use of about 8.0 standard drinks of alcohol per week. He reports that he does not use drugs.  Allergies  Allergen Reactions   Oxcarbazepine Other (See Comments)    Dizziness/ lightheaded   Zolpidem Nausea Only    Family History  Problem Relation Age of Onset   CAD Mother    Leukemia Mother    Heart attack Father    Heart attack Brother     Prior to Admission medications   Medication Sig Start Date End Date Taking? Authorizing Provider  acetaminophen (TYLENOL) 325 MG tablet Take 2 tablets (650 mg total) by mouth every 6 (six) hours as needed. 09/27/21  Yes Tanda Rockers A, DO  albuterol (PROVENTIL HFA;VENTOLIN HFA) 108 (90 BASE) MCG/ACT inhaler Inhale 2 puffs into the lungs every 6 (six) hours as needed for wheezing.   Yes [provider]  allopurinol (ZYLOPRIM) 100 MG tablet Take 100 mg by mouth daily. 05/07/22  Yes [provider]  allopurinol (ZYLOPRIM) 300 MG tablet Take 1 tablet by mouth daily. 10/12/21  Yes [provider]  ALPRAZolam Prudy Feeler) 1 MG tablet Take 1 mg by mouth at bedtime. 05/24/22  Yes [provider]  amLODipine (NORVASC) 10 MG tablet Take 10 mg by mouth daily. 10/21/21  Yes [provider]  apixaban (ELIQUIS) 5 MG TABS tablet Take 1 tablet (5 mg total) by mouth 2 (two) times daily. 02/28/22  Yes Jodelle Red, MD  atorvastatin (LIPITOR) 80 MG tablet Take 40 mg by mouth daily with supper.   Yes [provider]  empagliflozin (JARDIANCE) 25 MG TABS tablet Take 12.5 mg by mouth 2 (two) times daily.   Yes [provider]  fluticasone-salmeterol (WIXELA INHUB) 250-50 MCG/ACT  AEPB Inhale 1 puff into the lungs in the morning and at bedtime. 09/05/22  Yes Luciano Cutter, MD  gabapentin (NEURONTIN) 300 MG capsule Take 300 mg by mouth 3 (three) times daily.   Yes [provider]  hydrALAZINE (APRESOLINE) 25 MG tablet Take 25 mg by mouth daily. 10/21/21  Yes [provider]  Magnesium Oxide 420 MG TABS Take 420 mg by mouth daily with supper.   Yes [provider]  metoprolol succinate (TOPROL-XL) 25 MG 24 hr tablet Take 0.5 tablets (12.5 mg total) by mouth daily. 09/04/21  Yes Lonia Blood, MD  Multiple Vitamins-Minerals (MULTIVITAMIN WITH MINERALS) tablet Take 1 tablet by mouth every Monday, Wednesday, and Friday.   Yes [provider]  omeprazole (PRILOSEC) 20 MG capsule Take 20 mg by mouth daily with supper.   Yes [provider]  potassium chloride SA (KLOR-CON M) 20 MEQ tablet Take 1 tablet (20 mEq total) by mouth daily with supper. 05/20/21  Yes Dahal, Melina Schools, MD  spironolactone (ALDACTONE) 25 MG tablet Take 25 mg by mouth every morning.   Yes [provider]  thiamine (VITAMIN B-1) 100 MG tablet Take 100 mg by mouth daily.   Yes [provider]  Tiotropium Bromide Monohydrate (SPIRIVA RESPIMAT) 2.5 MCG/ACT AERS Inhale 2 puffs into the lungs daily. 09/05/22  Yes Luciano Cutter, MD  torsemide (DEMADEX) 20 MG tablet Take 20 mg by mouth 2 (two) times daily. 10/21/21  Yes [provider]  vitamin B-12 (CYANOCOBALAMIN) 500 MCG tablet Take 500 mcg by mouth daily with supper.   Yes [provider]  azithromycin (ZITHROMAX) 250 MG tablet Take two tablets on day 1, then one tablet daily on day 2-5. 08/25/22   Luciano Cutter, MD    Physical Exam: Vitals:   09/14/22 1115 09/14/22 1130 09/14/22 1409 09/14/22 1515  BP: (!) 164/70 (!) 152/63  (!) 142/77  Pulse: 67 69  66  Resp:  14  12  Temp:   98 F (36.7 C)   TempSrc:   Oral   SpO2: 98% 97%  99%   General:  Appears calm and comfortable  and is in NAD Eyes:  EOMI, normal lids, iris ENT:  grossly normal hearing, lips & tongue, mmm; appropriate dentition Neck:  no LAD, masses or thyromegaly Cardiovascular:  RRR, no m/r/g. 1+ pedal edema.  Respiratory:   CTA bilaterally with no wheezes/rales/rhonchi.  Normal respiratory effort. Abdomen:  soft, NT, ND Skin:  no rash or induration seen on limited exam Musculoskeletal:  grossly normal tone BUE/BLE, good ROM, no bony abnormality Psychiatric:  grossly normal mood and affect, speech fluent and appropriate, AOx3, loquacious Neurologic:  CN 2-12 grossly intact, moves all extremities in coordinated fashion   Radiological Exams on Admission: Independently reviewed - see discussion in A/P where applicable  CT Thoracic Spine Wo Contrast  Result Date: 09/14/2022 CLINICAL DATA:  Fall EXAM: CT THORACIC AND LUMBAR SPINE WITHOUT CONTRAST TECHNIQUE: Multidetector CT imaging of the thoracic and lumbar spine was performed without contrast. Multiplanar CT image reconstructions were also generated. RADIATION DOSE REDUCTION: This exam was performed according to the departmental dose-optimization program which includes automated exposure control, adjustment of the mA and/or kV according to patient size and/or use of iterative reconstruction technique. COMPARISON:  CT lumbar spine 02/19/2021, no prior CT of the thoracic spine FINDINGS: CT THORACIC SPINE FINDINGS Alignment: Mild dextrocurvature. Straightening of the normal thoracic kyphosis. Vertebrae: No acute fracture or suspicious osseous lesion. Minimal chronic wedging of T7-T9. Paraspinal and other soft tissues: Aortic atherosclerosis. Calcified ductus diverticulum. No focal pulmonary opacity or pleural effusion in the imaged lungs. Disc levels: Multilevel degenerative changes, with mild spinal canal stenosis at T10-T11. Mild neural foraminal narrowing at T1-T2 bilaterally. Moderate right neural foraminal narrowing at T10-T11 and mild left neural  foraminal narrowing. CT LUMBAR SPINE FINDINGS Segmentation: 5 lumbar type vertebral bodies. Alignment: Mild levocurvature.  No significant listhesis. Vertebrae: No acute fracture or suspicious osseous lesion. Paraspinal and other soft tissues: Abdominal aortic atherosclerosis. Disc levels: Multilevel degenerative changes, with moderate spinal canal stenosis at L2-L3 and moderate to severe spinal canal stenosis at L3-L4. IMPRESSION: 1. No acute fracture or traumatic listhesis in the thoracic or lumbar spine. 2. Multilevel degenerative changes, with mild spinal canal stenosis at T10-T11, moderate spinal canal stenosis at L2-L3, and moderate to severe spinal canal stenosis. This could be further evaluated with an MRI if clinically indicated. 3. Aortic atherosclerosis. Aortic Atherosclerosis (ICD10-I70.0). Electronically Signed   By: Wiliam Ke M.D.   On: 09/14/2022 14:35   CT Lumbar Spine Wo Contrast  Result Date: 09/14/2022 CLINICAL DATA:  Fall EXAM: CT THORACIC AND LUMBAR SPINE WITHOUT CONTRAST TECHNIQUE: Multidetector CT imaging of the thoracic and lumbar spine was performed without contrast. Multiplanar CT image reconstructions were also generated. RADIATION DOSE REDUCTION: This exam was performed according to the departmental dose-optimization program which includes automated exposure control, adjustment of the mA and/or kV according to patient size and/or use of iterative reconstruction technique. COMPARISON:  CT lumbar spine 02/19/2021, no prior CT of the thoracic spine FINDINGS: CT THORACIC SPINE FINDINGS Alignment: Mild dextrocurvature. Straightening of the normal thoracic kyphosis. Vertebrae: No acute fracture or suspicious osseous lesion. Minimal chronic wedging of T7-T9. Paraspinal and other soft tissues: Aortic atherosclerosis. Calcified ductus diverticulum. No focal pulmonary opacity or pleural effusion in the imaged lungs. Disc levels: Multilevel degenerative changes, with mild spinal canal  stenosis at T10-T11. Mild neural foraminal narrowing at T1-T2 bilaterally. Moderate right neural foraminal narrowing at T10-T11 and mild left neural foraminal narrowing. CT LUMBAR SPINE FINDINGS Segmentation: 5 lumbar type vertebral bodies. Alignment: Mild levocurvature.  No significant listhesis. Vertebrae: No acute fracture or suspicious osseous lesion. Paraspinal and other soft tissues: Abdominal aortic atherosclerosis. Disc levels: Multilevel degenerative changes, with moderate spinal canal stenosis at L2-L3 and moderate to severe spinal canal stenosis at L3-L4. IMPRESSION: 1. No acute fracture or traumatic listhesis in the thoracic or lumbar spine. 2. Multilevel degenerative changes, with mild spinal canal stenosis at T10-T11, moderate spinal canal stenosis at L2-L3, and moderate to severe spinal canal stenosis. This could be further evaluated with an MRI if clinically indicated. 3. Aortic atherosclerosis. Aortic Atherosclerosis (ICD10-I70.0). Electronically Signed   By: Wiliam Ke M.D.   On: 09/14/2022 14:35   DG Hip Unilat W or Wo Pelvis 2-3 Views Left  Result Date: 09/14/2022 CLINICAL DATA:  Fall this morning. EXAM: DG HIP (WITH OR WITHOUT PELVIS) 2-3V LEFT COMPARISON:  01/11/2022 FINDINGS: Prostate fiducials noted. Mild craniocaudad degenerative loss of articular space in both hips. No fracture or acute bony findings noted. IMPRESSION: 1. No acute findings. 2. Mild degenerative loss of articular space in both hips. Electronically Signed   By: Gaylyn Rong M.D.   On: 09/14/2022 11:14   DG Chest Portable 1 View  Result Date: 09/14/2022 CLINICAL DATA:  Fall, left hip pain, dizziness and blurred vision. EXAM: PORTABLE CHEST 1 VIEW COMPARISON:  06/17/2022 FINDINGS: The lungs appear clear. Cardiac and mediastinal contours normal. No pleural effusion identified. Mid and lower thoracic spondylosis. IMPRESSION: 1. No active cardiopulmonary disease is radiographically apparent. 2. Mid and lower  thoracic spondylosis. Electronically Signed   By: Gaylyn Rong M.D.   On: 09/14/2022 11:12   DG Ankle Complete Right  Result Date: 09/14/2022 CLINICAL DATA:  Fall, right ankle pain EXAM: RIGHT ANKLE - COMPLETE 3+ VIEW COMPARISON:  01/11/2022 FINDINGS: Radiodensities associated with bandaging or sock noted along the heel and foot. Chronic ossicles below the medial malleolus similar to 8/20 2/23. These appear well corticated. Chronic lucent osteochondral lesion of the medial talar dome with sclerotic margin. No fracture or acute bony findings identified. Posterior spurring along the posterior subtalar joint. Dorsal spurring at the talonavicular articulation. Mild midfoot degenerative spurring. Faint linear calcifications project over the distal Achilles tendon and could reflect mild calcific tendinopathy. IMPRESSION: 1. No acute bony findings. 2. 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 3. Chronic ossicles below the medial malleolus likely from remote trauma. 4. Mild calcific tendinopathy of the distal Achilles tendon. 5. Degenerative findings in the midfoot and hindfoot. 6. Radiodensities associated with bandaging or sock noted along the heel and foot. Electronically Signed   By: Gaylyn Rong M.D.   On: 09/14/2022 11:11   CT HEAD WO CONTRAST ( )  Result Date: 09/14/2022 CLINICAL DATA:  Provided history: Head trauma, abnormal mental status. Neck trauma. Fall. EXAM: CT HEAD WITHOUT CONTRAST CT CERVICAL SPINE WITHOUT CONTRAST TECHNIQUE: Multidetector CT imaging of the head and cervical spine was performed following the standard protocol without intravenous contrast. Multiplanar CT image reconstructions of the cervical spine were also generated. RADIATION DOSE REDUCTION: This exam was performed according to the departmental dose-optimization program which includes automated exposure control, adjustment of the mA and/or kV according to patient size and/or use of iterative reconstruction  technique. COMPARISON:  Head CT 06/05/2020 brain MRI 12/16/2019. Cervical spine MRI 12/16/2019. FINDINGS: CT HEAD FINDINGS Brain: Generalized cerebral atrophy. Patchy and ill-defined hypoattenuation within the cerebral white matter, nonspecific but compatible with mild-to-moderate chronic small vessel ischemic disease. There is no acute intracranial hemorrhage. No demarcated cortical infarct. No extra-axial fluid collection. No evidence of an intracranial mass. No midline shift. Vascular: No hyperdense vessel.  Atherosclerotic calcifications. Skull: No fracture or aggressive osseous lesion. Sinuses/Orbits: No mass or acute finding within the imaged orbits. Trace mucosal thickening within the bilateral ethmoid sinuses. CT CERVICAL SPINE FINDINGS Alignment: Straightening of the expected cervical lordosis. No significant spondylolisthesis. Skull base and vertebrae: The basion-dental and atlanto-dental intervals are maintained.No evidence of acute fracture to the cervical spine. Soft tissues and spinal canal: No prevertebral fluid or swelling. No visible canal hematoma. Disc levels: Cervical spondylosis with multilevel disc space narrowing, disc bulges/disc protrusions, posterior disc osteophyte complexes, uncovertebral hypertrophy and facet arthrosis. Multilevel spinal canal stenosis. Most notably, a posterior disc osteophyte complex contributes to suspected  moderate spinal canal stenosis at C5-C6. Multilevel bony neural foraminal narrowing. Upper chest: No consolidation within the imaged lung apices. No visible pneumothorax. IMPRESSION: CT head: 1.  No evidence of an acute intracranial abnormality. 2. Parenchymal atrophy and chronic small vessel ischemic disease. CT cervical spine: 1. No evidence of acute fracture to the cervical spine. 2. Nonspecific straightening of the expected cervical lordosis. 3. Cervical spondylosis, as described. Electronically Signed   By: Jackey Loge D.O.   On: 09/14/2022 10:27   CT  Cervical Spine Wo Contrast  Result Date: 09/14/2022 CLINICAL DATA:  Provided history: Head trauma, abnormal mental status. Neck trauma. Fall. EXAM: CT HEAD WITHOUT CONTRAST CT CERVICAL SPINE WITHOUT CONTRAST TECHNIQUE: Multidetector CT imaging of the head and cervical spine was performed following the standard protocol without intravenous contrast. Multiplanar CT image reconstructions of the cervical spine were also generated. RADIATION DOSE REDUCTION: This exam was performed according to the departmental dose-optimization program which includes automated exposure control, adjustment of the mA and/or kV according to patient size and/or use of iterative reconstruction technique. COMPARISON:  Head CT 06/05/2020 brain MRI 12/16/2019. Cervical spine MRI 12/16/2019. FINDINGS: CT HEAD FINDINGS Brain: Generalized cerebral atrophy. Patchy and ill-defined hypoattenuation within the cerebral white matter, nonspecific but compatible with mild-to-moderate chronic small vessel ischemic disease. There is no acute intracranial hemorrhage. No demarcated cortical infarct. No extra-axial fluid collection. No evidence of an intracranial mass. No midline shift. Vascular: No hyperdense vessel.  Atherosclerotic calcifications. Skull: No fracture or aggressive osseous lesion. Sinuses/Orbits: No mass or acute finding within the imaged orbits. Trace mucosal thickening within the bilateral ethmoid sinuses. CT CERVICAL SPINE FINDINGS Alignment: Straightening of the expected cervical lordosis. No significant spondylolisthesis. Skull base and vertebrae: The basion-dental and atlanto-dental intervals are maintained.No evidence of acute fracture to the cervical spine. Soft tissues and spinal canal: No prevertebral fluid or swelling. No visible canal hematoma. Disc levels: Cervical spondylosis with multilevel disc space narrowing, disc bulges/disc protrusions, posterior disc osteophyte complexes, uncovertebral hypertrophy and facet arthrosis.  Multilevel spinal canal stenosis. Most notably, a posterior disc osteophyte complex contributes to suspected moderate spinal canal stenosis at C5-C6. Multilevel bony neural foraminal narrowing. Upper chest: No consolidation within the imaged lung apices. No visible pneumothorax. IMPRESSION: CT head: 1.  No evidence of an acute intracranial abnormality. 2. Parenchymal atrophy and chronic small vessel ischemic disease. CT cervical spine: 1. No evidence of acute fracture to the cervical spine. 2. Nonspecific straightening of the expected cervical lordosis. 3. Cervical spondylosis, as described. Electronically Signed   By: Jackey Loge D.O.   On: 09/14/2022 10:27    EKG: Independently reviewed.  Afib with rate 59; no evidence of acute ischemia   Labs on Admission: I have personally reviewed the available labs and imaging studies at the time of the admission.  Pertinent labs:    Na++ 124 CO2 17 BUN 38/Creatinine 1.76/GFR 40; 38/1.32/57  WBC 8 Hgb 12.5 INR 1.4   Assessment and Plan: Principal Problem:   Fall at home, initial encounter Active Problems:   Alcohol dependence   Benign essential hypertension   COPD with asthma   Mixed hyperlipidemia   Type 2 diabetes mellitus with obesity   OSA (obstructive sleep apnea)   Morbid obesity   Persistent atrial fibrillation   Acute kidney injury superimposed on chronic kidney disease   BPH (benign prostatic hyperplasia)   Ambulatory dysfunction    Ambulatory dysfunction with fall at home -Patient presenting with a fall at home -This occurred about 730  AM and he had already had 5-6 beers prior to the fall -Suspect ETOH dependence is a strong contributing factor -Will observe overnight -PT/OT evaluations  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 -TOC team consult for possible inpatient treatment -Consider offering a medication for Alcohol Use Disorder at the time of d/c, to include Disulfuram; Naltrexone; or Acamprosate.  COPD -Continue Wixhela (Dulera per formulary), Spiriva, prn albuterol  Gout -Continue allopurinol  HTN -Continue amlodipine, Toprol XL -He has been taking once daily hydralazine but will hold since this isn't likely to be effective  DM -Recent A1c was 5.9, good control -hold Jardiance -Cover with moderate-scale SSI  -Continue gabapentin  HLD -Continue atorvastatin  OSA -Continue CPAP  Afib -Rate controlled with Toprol XL -Continue Eliquis -No apparent bleeding; may need to consider risks/benefits of ongoing AC use, particularly if he continues to drink  Chronic diastolic CHF -Preserved EF on echo in 2020 -Continue torsemide and spironolactone for now  Obesity -Weight loss should be encouraged -Outpatient PCP/bariatric medicine f/u encouraged     Advance Care Planning:   Code Status: Full Code - Code status was discussed with the patient and/or family at the time of admission.  The patient would want to receive full resuscitative measures at this time.   Consults: PT/OT; TOC team  DVT Prophylaxis: Eliquis  Family Communication: None present  Severity of Illness: The appropriate patient status for this patient is OBSERVATION. Observation status is judged to be reasonable and necessary in order to provide the required intensity of service to ensure the patient's safety. The patient's presenting symptoms, physical exam findings, and initial radiographic and laboratory data in the  context of their medical condition is felt to place them at decreased risk for further clinical deterioration. Furthermore, it is anticipated that the patient will be medically stable for discharge from the hospital within 2 midnights of admission.   Author: Jonah Blue, MD 09/14/2022 5:31 PM  For on call review [email or web address].

## 2022-09-14 NOTE — Progress Notes (Signed)
Pt has orders for CPAP at bedtime. RT paged.

## 2022-09-14 NOTE — ED Notes (Signed)
ED TO INPATIENT HANDOFF REPORT  ED Nurse Name and Phone #:   S Name/Age/Gender Francis Cardenas 72 y.o. male Room/Bed: 018C/018C  Code Status   Code Status: Prior  Home/SNF/Other Home Patient oriented to: self, place, time, and situation Is this baseline? Yes   Triage Complete: Triage complete  Chief Complaint Fall at home, initial encounter [W19.Lorne Skeens, Y92.009]  Triage Note Patient BIB EMS from home for a fall this morning. 12-14 beers since 0100 today. Balance has been off lately. No drinking on yesterday. Dizziness/blurred vision. Neg stroke. Patient on blood thinner. Denies hitting head or LOC. CBG 109. C/O right hip and ankle pain. Back pain with H/O back pain. C-spine cleared.No deformities.   Allergies Allergies  Allergen Reactions   Oxcarbazepine Other (See Comments)    Dizziness/ lightheaded   Zolpidem Nausea Only    Level of Care/Admitting Diagnosis ED Disposition     ED Disposition  Admit   Condition  --   Comment  Hospital Area: [facility] [[id number]]  Level of Care: Med-Surg [16]  May place patient in observation at [facility] or [facility] if equivalent level of care is available:: Yes  Covid Evaluation: Asymptomatic - no recent exposure (last 10 days) testing not required  Diagnosis: Fall at home, initial encounter [[id number]]  Admitting Physician: Jonah Blue [[id number]]  Attending Physician: Jonah Blue [[id number]]          B Medical/Surgery History Past Medical History:  Diagnosis Date   BPH (benign prostatic hyperplasia)    Cancer    prostate   COPD (chronic obstructive pulmonary disease)    Diabetes mellitus without complication    Hypercholesteremia    Hypertension    Past Surgical History:  Procedure Laterality Date   ACHILLES TENDON REPAIR Left    KNEE ARTHROSCOPY Left    RIGHT/LEFT HEART CATH AND CORONARY ANGIOGRAPHY N/A 04/09/2019   Procedure: RIGHT/LEFT HEART CATH AND CORONARY ANGIOGRAPHY;  Surgeon: [place],  Peter M, MD;  Location: [facility] INV[facility]IVE [facility];  Service: Cardiovascular;  Laterality: N/A;   TONSILLECTOMY     WRIST FRACTURE SURGERY Left      A IV Location/Drains/Wounds Patient Lines/Drains/Airways Status     Active Line/Drains/Airways     Name Placement date Placement time Site Days   Peripheral IV 09/14/22 22 G Posterior;Left Hand 09/14/22  0948  Hand  less than 1   Pressure Injury 05/15/21 Buttocks Left Stage 2 -  Partial thickness loss of dermis presenting as a shallow open injury with a red, pink wound bed without slough. bruising around the site vs. deep tissue injury 05/15/21  0300  -- 487   Wound / Incision (Open or Dehisced) 06/15/22 Irritant Dermatitis (Moisture Associated Skin Damage) Groin Right;Left 06/15/22  1930  Groin  91            Intake/Output Last 24 hours No intake or output data in the 24 hours ending 09/14/22 1659  Labs/Imaging Results for orders placed or performed during the hospital encounter of 09/14/22 (from the past 48 hour(s))  CBC with Differential     Status: Abnormal   Collection Time: 09/14/22  9:40 AM  Result Value Ref Range   WBC 8.0 4.0 - 10.5 K/uL   RBC 3.94 (L) 4.22 - 5.81 MIL/uL   Hemoglobin 12.5 (L) 13.0 - 17.0 g/dL   HCT 04.5 (L) 40.9 - 81.1 %   MCV 98.0 80.0 - 100.0 fL   MCH 31.7 26.0 - 34.0 pg   MCHC 32.4  30.0 - 36.0 g/dL   RDW 74.2 59.5 - 63.8 %   Platelets 167 150 - 400 K/uL   nRBC 0.0 0.0 - 0.2 %   Neutrophils Relative % 67 %   Neutro Abs 5.5 1.7 - 7.7 K/uL   Lymphocytes Relative 18 %   Lymphs Abs 1.4 0.7 - 4.0 K/uL   Monocytes Relative 7 %   Monocytes Absolute 0.5 0.1 - 1.0 K/uL   Eosinophils Relative 5 %   Eosinophils Absolute 0.4 0.0 - 0.5 K/uL   Basophils Relative 1 %   Basophils Absolute 0.1 0.0 - 0.1 K/uL   Immature Granulocytes 2 %   Abs Immature Granulocytes 0.15 (H) 0.00 - 0.07 K/uL    Comment: Performed at [facility], [address] [place], [place] [address]  Comprehensive metabolic panel      Status: Abnormal   Collection Time: 09/14/22  9:40 AM  Result Value Ref Range   Sodium 124 (L) 135 - 145 mmol/L   Potassium 4.8 3.5 - 5.1 mmol/L   Chloride 94 (L) 98 - 111 mmol/L   CO2 17 (L) 22 - 32 mmol/L   Glucose, Bld 93 70 - 99 mg/dL    Comment: Glucose reference range applies only to samples taken after fasting for at least 8 hours.   BUN 38 (H) 8 - 23 mg/dL   Creatinine, Ser 3.29 (H) 0.61 - 1.24 mg/dL   Calcium 51.8 (H) 8.9 - 10.3 mg/dL   Total Protein 6.7 6.5 - 8.1 g/dL   Albumin 4.1 3.5 - 5.0 g/dL   AST 23 15 - 41 U/L   ALT 24 0 - 44 U/L   Alkaline Phosphatase 82 38 - 126 U/L   Total Bilirubin 1.0 0.3 - 1.2 mg/dL   GFR, Estimated 40 (L) >60 mL/min    Comment: (NOTE) Calculated using the CKD-EPI Creatinine Equation (2021)    Anion gap 13 5 - 15    Comment: Performed at [facility], [address], [place], [place] [address]  Ethanol     Status: Abnormal   Collection Time: 09/14/22  9:40 AM  Result Value Ref Range   Alcohol, Ethyl (B) 74 (H) <10 mg/dL    Comment: (NOTE) Lowest detectable limit for serum alcohol is 10 mg/dL.  For medical purposes only. Performed at [facility], [address] [place], [place] [address]   Protime-INR     Status: Abnormal   Collection Time: 09/14/22  9:40 AM  Result Value Ref Range   Prothrombin Time 16.7 (H) 11.4 - 15.2 seconds   INR 1.4 (H) 0.8 - 1.2    Comment: (NOTE) INR goal varies based on device and disease states. Performed at [facility], [address], [place], [place] [address]    CT Thoracic Spine Wo Contrast  Result Date: 09/14/2022 CLINICAL DATA:  Fall EXAM: CT THORACIC AND LUMBAR SPINE WITHOUT CONTRAST TECHNIQUE: Multidetector CT imaging of the thoracic and lumbar spine was performed without contrast. Multiplanar CT image reconstructions were also generated. RADIATION DOSE REDUCTION: This exam was performed according to the departmental dose-optimization program which includes automated  exposure control, adjustment of the mA and/or kV according to patient size and/or use of iterative reconstruction technique. COMPARISON:  CT lumbar spine 02/19/2021, no prior CT of the thoracic spine FINDINGS: CT THORACIC SPINE FINDINGS Alignment: Mild dextrocurvature. Straightening of the normal thoracic kyphosis. Vertebrae: No acute fracture or suspicious osseous lesion. Minimal chronic wedging of T7-T9. Paraspinal  and other soft tissues: Aortic atherosclerosis. Calcified ductus diverticulum. No focal pulmonary opacity or pleural effusion in the imaged lungs. Disc levels: Multilevel degenerative changes, with mild spinal canal stenosis at T10-T11. Mild neural foraminal narrowing at T1-T2 bilaterally. Moderate right neural foraminal narrowing at T10-T11 and mild left neural foraminal narrowing. CT LUMBAR SPINE FINDINGS Segmentation: 5 lumbar type vertebral bodies. Alignment: Mild levocurvature.  No significant listhesis. Vertebrae: No acute fracture or suspicious osseous lesion. Paraspinal and other soft tissues: Abdominal aortic atherosclerosis. Disc levels: Multilevel degenerative changes, with moderate spinal canal stenosis at L2-L3 and moderate to severe spinal canal stenosis at L3-L4. IMPRESSION: 1. No acute fracture or traumatic listhesis in the thoracic or lumbar spine. 2. Multilevel degenerative changes, with mild spinal canal stenosis at T10-T11, moderate spinal canal stenosis at L2-L3, and moderate to severe spinal canal stenosis. This could be further evaluated with an MRI if clinically indicated. 3. Aortic atherosclerosis. Aortic Atherosclerosis (ICD10-I70.0). Electronically Signed   By: Wiliam Ke M.D.   On: 09/14/2022 14:35   CT Lumbar Spine Wo Contrast  Result Date: 09/14/2022 CLINICAL DATA:  Fall EXAM: CT THORACIC AND LUMBAR SPINE WITHOUT CONTRAST TECHNIQUE: Multidetector CT imaging of the thoracic and lumbar spine was performed without contrast. Multiplanar CT image reconstructions were also  generated. RADIATION DOSE REDUCTION: This exam was performed according to the departmental dose-optimization program which includes automated exposure control, adjustment of the mA and/or kV according to patient size and/or use of iterative reconstruction technique. COMPARISON:  CT lumbar spine 02/19/2021, no prior CT of the thoracic spine FINDINGS: CT THORACIC SPINE FINDINGS Alignment: Mild dextrocurvature. Straightening of the normal thoracic kyphosis. Vertebrae: No acute fracture or suspicious osseous lesion. Minimal chronic wedging of T7-T9. Paraspinal and other soft tissues: Aortic atherosclerosis. Calcified ductus diverticulum. No focal pulmonary opacity or pleural effusion in the imaged lungs. Disc levels: Multilevel degenerative changes, with mild spinal canal stenosis at T10-T11. Mild neural foraminal narrowing at T1-T2 bilaterally. Moderate right neural foraminal narrowing at T10-T11 and mild left neural foraminal narrowing. CT LUMBAR SPINE FINDINGS Segmentation: 5 lumbar type vertebral bodies. Alignment: Mild levocurvature.  No significant listhesis. Vertebrae: No acute fracture or suspicious osseous lesion. Paraspinal and other soft tissues: Abdominal aortic atherosclerosis. Disc levels: Multilevel degenerative changes, with moderate spinal canal stenosis at L2-L3 and moderate to severe spinal canal stenosis at L3-L4. IMPRESSION: 1. No acute fracture or traumatic listhesis in the thoracic or lumbar spine. 2. Multilevel degenerative changes, with mild spinal canal stenosis at T10-T11, moderate spinal canal stenosis at L2-L3, and moderate to severe spinal canal stenosis. This could be further evaluated with an MRI if clinically indicated. 3. Aortic atherosclerosis. Aortic Atherosclerosis (ICD10-I70.0). Electronically Signed   By: Wiliam Ke M.D.   On: 09/14/2022 14:35   DG Hip Unilat W or Wo Pelvis 2-3 Views Left  Result Date: 09/14/2022 CLINICAL DATA:  Fall this morning. EXAM: DG HIP (WITH OR  WITHOUT PELVIS) 2-3V LEFT COMPARISON:  01/11/2022 FINDINGS: Prostate fiducials noted. Mild craniocaudad degenerative loss of articular space in both hips. No fracture or acute bony findings noted. IMPRESSION: 1. No acute findings. 2. Mild degenerative loss of articular space in both hips. Electronically Signed   By: Gaylyn Rong M.D.   On: 09/14/2022 11:14   DG Chest Portable 1 View  Result Date: 09/14/2022 CLINICAL DATA:  Fall, left hip pain, dizziness and blurred vision. EXAM: PORTABLE CHEST 1 VIEW COMPARISON:  06/17/2022 FINDINGS: The lungs appear clear. Cardiac and mediastinal contours normal. No pleural effusion identified. Mid  and lower thoracic spondylosis. IMPRESSION: 1. No active cardiopulmonary disease is radiographically apparent. 2. Mid and lower thoracic spondylosis. Electronically Signed   By: Gaylyn Rong M.D.   On: 09/14/2022 11:12   DG Ankle Complete Right  Result Date: 09/14/2022 CLINICAL DATA:  Fall, right ankle pain EXAM: RIGHT ANKLE - COMPLETE 3+ VIEW COMPARISON:  01/11/2022 FINDINGS: Radiodensities associated with bandaging or sock noted along the heel and foot. Chronic ossicles below the medial malleolus similar to 8/20 2/23. These appear well corticated. Chronic lucent osteochondral lesion of the medial talar dome with sclerotic margin. No fracture or acute bony findings identified. Posterior spurring along the posterior subtalar joint. Dorsal spurring at the talonavicular articulation. Mild midfoot degenerative spurring. Faint linear calcifications project over the distal Achilles tendon and could reflect mild calcific tendinopathy. IMPRESSION: 1. No acute bony findings. 2. 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 3. Chronic ossicles below the medial malleolus likely from remote trauma. 4. Mild calcific tendinopathy of the distal Achilles tendon. 5. Degenerative findings in the midfoot and hindfoot. 6. Radiodensities associated with bandaging or sock noted  along the heel and foot. Electronically Signed   By: Gaylyn Rong M.D.   On: 09/14/2022 11:11   CT HEAD WO CONTRAST ( )  Result Date: 09/14/2022 CLINICAL DATA:  Provided history: Head trauma, abnormal mental status. Neck trauma. Fall. EXAM: CT HEAD WITHOUT CONTRAST CT CERVICAL SPINE WITHOUT CONTRAST TECHNIQUE: Multidetector CT imaging of the head and cervical spine was performed following the standard protocol without intravenous contrast. Multiplanar CT image reconstructions of the cervical spine were also generated. RADIATION DOSE REDUCTION: This exam was performed according to the departmental dose-optimization program which includes automated exposure control, adjustment of the mA and/or kV according to patient size and/or use of iterative reconstruction technique. COMPARISON:  Head CT 06/05/2020 brain MRI 12/16/2019. Cervical spine MRI 12/16/2019. FINDINGS: CT HEAD FINDINGS Brain: Generalized cerebral atrophy. Patchy and ill-defined hypoattenuation within the cerebral white matter, nonspecific but compatible with mild-to-moderate chronic small vessel ischemic disease. There is no acute intracranial hemorrhage. No demarcated cortical infarct. No extra-axial fluid collection. No evidence of an intracranial mass. No midline shift. Vascular: No hyperdense vessel.  Atherosclerotic calcifications. Skull: No fracture or aggressive osseous lesion. Sinuses/Orbits: No mass or acute finding within the imaged orbits. Trace mucosal thickening within the bilateral ethmoid sinuses. CT CERVICAL SPINE FINDINGS Alignment: Straightening of the expected cervical lordosis. No significant spondylolisthesis. Skull base and vertebrae: The basion-dental and atlanto-dental intervals are maintained.No evidence of acute fracture to the cervical spine. Soft tissues and spinal canal: No prevertebral fluid or swelling. No visible canal hematoma. Disc levels: Cervical spondylosis with multilevel disc space narrowing, disc  bulges/disc protrusions, posterior disc osteophyte complexes, uncovertebral hypertrophy and facet arthrosis. Multilevel spinal canal stenosis. Most notably, a posterior disc osteophyte complex contributes to suspected moderate spinal canal stenosis at C5-C6. Multilevel bony neural foraminal narrowing. Upper chest: No consolidation within the imaged lung apices. No visible pneumothorax. IMPRESSION: CT head: 1.  No evidence of an acute intracranial abnormality. 2. Parenchymal atrophy and chronic small vessel ischemic disease. CT cervical spine: 1. No evidence of acute fracture to the cervical spine. 2. Nonspecific straightening of the expected cervical lordosis. 3. Cervical spondylosis, as described. Electronically Signed   By: Jackey Loge D.O.   On: 09/14/2022 10:27   CT Cervical Spine Wo Contrast  Result Date: 09/14/2022 CLINICAL DATA:  Provided history: Head trauma, abnormal mental status. Neck trauma. Fall. EXAM: CT HEAD WITHOUT CONTRAST CT CERVICAL SPINE  WITHOUT CONTRAST TECHNIQUE: Multidetector CT imaging of the head and cervical spine was performed following the standard protocol without intravenous contrast. Multiplanar CT image reconstructions of the cervical spine were also generated. RADIATION DOSE REDUCTION: This exam was performed according to the departmental dose-optimization program which includes automated exposure control, adjustment of the mA and/or kV according to patient size and/or use of iterative reconstruction technique. COMPARISON:  Head CT 06/05/2020 brain MRI 12/16/2019. Cervical spine MRI 12/16/2019. FINDINGS: CT HEAD FINDINGS Brain: Generalized cerebral atrophy. Patchy and ill-defined hypoattenuation within the cerebral white matter, nonspecific but compatible with mild-to-moderate chronic small vessel ischemic disease. There is no acute intracranial hemorrhage. No demarcated cortical infarct. No extra-axial fluid collection. No evidence of an intracranial mass. No midline shift.  Vascular: No hyperdense vessel.  Atherosclerotic calcifications. Skull: No fracture or aggressive osseous lesion. Sinuses/Orbits: No mass or acute finding within the imaged orbits. Trace mucosal thickening within the bilateral ethmoid sinuses. CT CERVICAL SPINE FINDINGS Alignment: Straightening of the expected cervical lordosis. No significant spondylolisthesis. Skull base and vertebrae: The basion-dental and atlanto-dental intervals are maintained.No evidence of acute fracture to the cervical spine. Soft tissues and spinal canal: No prevertebral fluid or swelling. No visible canal hematoma. Disc levels: Cervical spondylosis with multilevel disc space narrowing, disc bulges/disc protrusions, posterior disc osteophyte complexes, uncovertebral hypertrophy and facet arthrosis. Multilevel spinal canal stenosis. Most notably, a posterior disc osteophyte complex contributes to suspected moderate spinal canal stenosis at C5-C6. Multilevel bony neural foraminal narrowing. Upper chest: No consolidation within the imaged lung apices. No visible pneumothorax. IMPRESSION: CT head: 1.  No evidence of an acute intracranial abnormality. 2. Parenchymal atrophy and chronic small vessel ischemic disease. CT cervical spine: 1. No evidence of acute fracture to the cervical spine. 2. Nonspecific straightening of the expected cervical lordosis. 3. Cervical spondylosis, as described. Electronically Signed   By: Jackey Loge D.O.   On: 09/14/2022 10:27    Pending Labs Unresulted Labs (From admission, onward)     Start     Ordered   09/14/22 1310  Urinalysis, Routine w reflex microscopic -Urine, Clean Catch  Once,   URGENT       Question:  Specimen Source  Answer:  Urine, Clean Catch   09/14/22 1309            Vitals/Pain Today's Vitals   09/14/22 1115 09/14/22 1130 09/14/22 1409 09/14/22 1515  BP: (!) 164/70 (!) 152/63  (!) 142/77  Pulse: 67 69  66  Resp:  14  12  Temp:   98 F (36.7 C)   TempSrc:   Oral   SpO2:  98% 97%  99%  PainSc:        Isolation Precautions No active isolations  Medications Medications  ibuprofen (ADVIL) tablet 600 mg (600 mg Oral Not Given 09/14/22 1552)  nystatin (MYCOSTATIN/NYSTOP) topical powder ( Topical Given 09/14/22 1543)    Mobility walks with device     Focused Assessments    R Recommendations: See Admitting Provider Note  Report given to:   Additional Notes:

## 2022-09-15 DIAGNOSIS — N179 Acute kidney failure, unspecified: Secondary | ICD-10-CM

## 2022-09-15 DIAGNOSIS — W19XXXA Unspecified fall, initial encounter: Secondary | ICD-10-CM | POA: Diagnosis not present

## 2022-09-15 DIAGNOSIS — Y92009 Unspecified place in unspecified non-institutional (private) residence as the place of occurrence of the external cause: Secondary | ICD-10-CM | POA: Diagnosis not present

## 2022-09-15 LAB — BASIC METABOLIC PANEL
Anion gap: 8 (ref 5–15)
BUN: 32 mg/dL — ABNORMAL HIGH (ref 8–23)
CO2: 24 mmol/L (ref 22–32)
Calcium: 10.3 mg/dL (ref 8.9–10.3)
Chloride: 100 mmol/L (ref 98–111)
Creatinine, Ser: 1.53 mg/dL — ABNORMAL HIGH (ref 0.61–1.24)
GFR, Estimated: 48 mL/min — ABNORMAL LOW (ref >60)
Glucose, Bld: 138 mg/dL — ABNORMAL HIGH (ref 70–99)
Potassium: 4 mmol/L (ref 3.5–5.1)
Sodium: 132 mmol/L — ABNORMAL LOW (ref 135–145)

## 2022-09-15 LAB — CBC
HCT: 34.6 % — ABNORMAL LOW (ref 39.0–52.0)
Hemoglobin: 11.6 g/dL — ABNORMAL LOW (ref 13.0–17.0)
MCH: 31.5 pg (ref 26.0–34.0)
MCHC: 33.5 g/dL (ref 30.0–36.0)
MCV: 94 fL (ref 80.0–100.0)
Platelets: 150 10*3/uL (ref 150–400)
RBC: 3.68 MIL/uL — ABNORMAL LOW (ref 4.22–5.81)
RDW: 15.3 % (ref 11.5–15.5)
WBC: 5.5 10*3/uL (ref 4.0–10.5)
nRBC: 0 % (ref 0.0–0.2)

## 2022-09-15 LAB — GLUCOSE, CAPILLARY
Glucose-Capillary: 105 mg/dL — ABNORMAL HIGH (ref 70–99)
Glucose-Capillary: 124 mg/dL — ABNORMAL HIGH (ref 70–99)

## 2022-09-15 NOTE — Progress Notes (Signed)
   09/14/22 2143 09/15/22 0003 09/15/22 0006  CIWA-Ar  BP (!) 108/92  --  112/74  Pulse Rate 73 80 89  Nausea and Vomiting 0  --   --   Tactile Disturbances 0  --   --   Tremor 0  --   --   Auditory Disturbances 0  --   --   Paroxysmal Sweats 0  --   --   Visual Disturbances 0  --   --   Anxiety 0  --   --   Headache, Fullness in Head 0  --   --   Agitation 0  --   --   Orientation and Clouding of Sensorium 0  --   --   CIWA-Ar Total 0  --   --     09/15/22 0419  CIWA-Ar  BP 111/76  Pulse Rate 63  Nausea and Vomiting 0  Tactile Disturbances 0  Tremor 0  Auditory Disturbances 0  Paroxysmal Sweats 0  Visual Disturbances 0  Anxiety 0  Headache, Fullness in Head 0  Agitation 0  Orientation and Clouding of Sensorium 0  CIWA-Ar Total 0

## 2022-09-15 NOTE — Evaluation (Signed)
Occupational Therapy Evaluation Patient Details Name: Francis Cardenas MRN: [id number] DOB: 08/28/1950 Today's Date: 09/15/2022   History of Present Illness 72 y.o. male admitted 4/24 with a fall. Medical history significant of BPH, HTN, DM, HLD, alcohol dependence, COPD, OSA on CPAP, afib on Eliquis, and prostate CA   Clinical Impression   Pt is at Sup level of function with ADLs/selfcare and ADL mobility using RW. PTA pt lived at home with his wife and he was Ind with ADLs/selfcare, IADLs and driving; wasn't using his rollater for mobility. Pt's wife will be with him 24/7 and can provide any assist as needed. All education completed and no further acute OT services are indicated at this time.      Recommendations for follow up therapy are one component of a multi-disciplinary discharge planning process, led by the attending physician.  Recommendations may be updated based on patient status, additional functional criteria and insurance authorization.   Assistance Recommended at Discharge Set up Supervision/Assistance  Patient can return home with the following A little help with bathing/dressing/bathroom    Functional Status Assessment  Patient has not had a recent decline in their functional status  Equipment Recommendations  None recommended by OT    Recommendations for Other Services       Precautions / Restrictions Precautions Precautions: Fall Restrictions Weight Bearing Restrictions: No      Mobility Bed Mobility               General bed mobility comments: In recliner upon arrival    Transfers Overall transfer level: Needs assistance Equipment used: Rolling walker (2 wheels) Transfers: Sit to/from Stand Sit to Stand: Supervision                  Balance Overall balance assessment: Needs assistance Sitting-balance support: No upper extremity supported, Feet supported Sitting balance-Leahy Scale: Good     Standing balance support: No upper  extremity supported, During functional activity Standing balance-Leahy Scale: Fair                             ADL either performed or assessed with clinical judgement   ADL Overall ADL's : Needs assistance/impaired Eating/Feeding: Independent   Grooming: Wash/dry hands;Wash/dry face;Supervision/safety;Standing   Upper Body Bathing: Independent   Lower Body Bathing: Supervison/ safety   Upper Body Dressing : Independent   Lower Body Dressing: Supervision/safety   Toilet Transfer: Supervision/safety;Ambulation;Rolling walker (2 wheels)   Toileting- Clothing Manipulation and Hygiene: Supervision/safety;Sit to/from stand       Functional mobility during ADLs: Supervision/safety;Rolling walker (2 wheels)       Vision Ability to See in Adequate Light: 0 Adequate Patient Visual Report: No change from baseline       Perception     Praxis      Pertinent Vitals/Pain Pain Assessment Pain Assessment: 0-10 Pain Score: 2  Pain Location: R ankle/foot Pain Descriptors / Indicators: Aching, Sore Pain Intervention(s): Monitored during session, Repositioned     Hand Dominance Right   Extremity/Trunk Assessment Upper Extremity Assessment Upper Extremity Assessment: Overall WFL for tasks assessed   Lower Extremity Assessment Lower Extremity Assessment: Defer to PT evaluation       Communication Communication Communication: No difficulties   Cognition Arousal/Alertness: Awake/alert Behavior During Therapy: WFL for tasks assessed/performed Overall Cognitive Status: Within Functional Limits for tasks assessed  General Comments       Exercises     Shoulder Instructions      Home Living Family/patient expects to be discharged to:: Private residence Living Arrangements: Spouse/significant other Available Help at Discharge: Family;Available 24 hours/day Type of Home: House Home Access: Level entry      Home Layout: One level     Bathroom Shower/Tub: Producer, television/film/video: Handicapped height Bathroom Accessibility: No   Home Equipment: Rollator (4 wheels);Shower seat;Grab bars - tub/shower          Prior Functioning/Environment Prior Level of Function : Independent/Modified Independent             Mobility Comments: Uses rollator periodically in the house ADLs Comments: Reports that he is Ind with ADLs/selfcare, drives but that he doesn't bathe as often as he shoudle because "it's a chore"        OT Problem List: Decreased activity tolerance;Impaired balance (sitting and/or standing);Decreased coordination;Obesity;Decreased knowledge of use of DME or AE;Decreased safety awareness      OT Treatment/Interventions:      OT Goals(Current goals can be found in the care plan section) Acute Rehab OT Goals Patient Stated Goal: go home  OT Frequency:      Co-evaluation              AM-PAC OT "6 Clicks" Daily Activity     Outcome Measure Help from another person eating meals?: None Help from another person taking care of personal grooming?: A Little Help from another person toileting, which includes using toliet, bedpan, or urinal?: A Little Help from another person bathing (including washing, rinsing, drying)?: A Little Help from another person to put on and taking off regular upper body clothing?: None Help from another person to put on and taking off regular lower body clothing?: A Little 6 Click Score: 20   End of Session Equipment Utilized During Treatment: Rolling walker (2 wheels)  Activity Tolerance: Patient tolerated treatment well Patient left: in chair;with call bell/phone within reach  OT Visit Diagnosis: Unsteadiness on feet (R26.81);Other abnormalities of gait and mobility (R26.89);History of falling (Z91.81)                Time: 1610-9604 OT Time Calculation (min): 21 min Charges:  OT General Charges $OT Visit: 1 Visit OT  Evaluation $OT Eval Low Complexity: 1 Low    Galen Manila 09/15/2022, 12:56 PM

## 2022-09-15 NOTE — TOC Transition Note (Signed)
Transition of Care [facility]) - CM/SW Discharge Note   Patient Details  Name: Francis Cardenas MRN: [id number] Date of Birth: 03-06-1951  Transition of Care [facility]) CM/SW Contact:  Tom-Johnson, Hershal Coria, RN Phone Number: 09/15/2022, 10:52 AM   Clinical Narrative:     Patient is scheduled for discharge today. Home health info, hospital f/u and discharge instructions on AVS.  [facility] notified of admission and notification number is [phone]. Wife, Coy Saunas to transport at discharge. No further TOC needs noted.      Final next level of care: Home w Home Health Services Barriers to Discharge: Barriers Resolved   Patient Goals and CMS Choice CMS Medicare.gov Compare Post Acute Care list provided to:: Patient Choice offered to / list presented to : Patient, Spouse  Discharge Placement                  Patient to be transferred to facility by: Wife Name of family member notified: Jessica Priest    Discharge Plan and Services Additional resources added to the After Visit Summary for                  DME Arranged: N/A DME Agency: NA       HH Arranged: PT HH Agency: [facility] Date [facility] Agency Contacted: 09/15/22 Time HH Agency Contacted: [id number] Representative spoke with at [facility] Agency: Kandee Keen  Social Determinants of Health (SDOH) Interventions SDOH Screenings   Food Insecurity: Unknown (06/15/2022)  Housing: Low Risk  (06/15/2022)  Transportation Needs: No Transportation Needs (06/15/2022)  Utilities: Not At Risk (06/15/2022)  Alcohol Screen: Medium Risk (01/04/2018)  Tobacco Use: Medium Risk (09/14/2022)     Readmission Risk Interventions    09/02/2021   11:32 AM  Readmission Risk Prevention Plan  Transportation Screening Complete  HRI or Home Care Consult Complete  Palliative Care Screening Not Applicable  Medication Review (RN Care Manager) Complete

## 2022-09-15 NOTE — Plan of Care (Signed)
  Problem: Health Behavior/Discharge Planning: Goal: Ability to manage health-related needs will improve Outcome: Progressing   Problem: Clinical Measurements: Goal: Respiratory complications will improve Outcome: Progressing   Problem: Coping: Goal: Level of anxiety will decrease Outcome: Progressing   Problem: Pain Managment: Goal: General experience of comfort will improve Outcome: Progressing   Problem: Safety: Goal: Ability to remain free from injury will improve Outcome: Progressing

## 2022-09-15 NOTE — Evaluation (Signed)
Physical Therapy Evaluation Patient Details Name: Francis Cardenas MRN: [id number] DOB: 10-Dec-1950 Today's Date: 09/15/2022  History of Present Illness  72 y.o. male admitted 4/24 with a fall. Medical history significant of BPH, HTN, DM, HLD, alcohol dependence, COPD, OSA on CPAP, afib on Eliquis, and prostate CA  Clinical Impression  Pt admitted with above diagnosis. Independent PTA, not using assistive device recently. Reports periodic episodes of LOB and falls over the years. Educated on fall risk (BERG 34/56 high risk) use of assistive device for safety, and need for follow-up post acute rehab to improve strength, balance, and gait. Patient agreeable to using his rollator full time at home. Pt currently with functional limitations due to the deficits listed below (see PT Problem List). Pt will benefit from acute skilled PT to increase their independence and safety with mobility to allow discharge.          Recommendations for follow up therapy are one component of a multi-disciplinary discharge planning process, led by the attending physician.  Recommendations may be updated based on patient status, additional functional criteria and insurance authorization.  Follow Up Recommendations   HHPT     Assistance Recommended at Discharge Set up Supervision/Assistance  Patient can return home with the following  A little help with walking and/or transfers;Assistance with cooking/housework;Assist for transportation    Equipment Recommendations None recommended by PT (Has a rollator at home, agreeable to use at all times.)     Functional Status Assessment Patient has had a recent decline in their functional status and demonstrates the ability to make significant improvements in function in a reasonable and predictable amount of time.     Precautions / Restrictions Precautions Precautions: Fall Restrictions Weight Bearing Restrictions: No      Mobility  Bed Mobility                General bed mobility comments: In recliner    Transfers Overall transfer level: Needs assistance Equipment used: Rolling walker (2 wheels) Transfers: Sit to/from Stand Sit to Stand: Supervision           General transfer comment: Supervision for safety, stable with RW for support upon rising.    Ambulation/Gait Ambulation/Gait assistance: Supervision, Min guard Gait Distance (Feet): 100 Feet Assistive device: Rolling walker (2 wheels), None Gait Pattern/deviations: Step-through pattern, Decreased stride length, Antalgic Gait velocity: decr Gait velocity interpretation: <1.31 ft/sec, indicative of household ambulator   General Gait Details: Slower gait min guard without assistive device showing impaired balance and moderately antalgic gait patterning due to Rt ankle pain. Wide BOS. With AD pt stable, supervision level for safety.  Stairs            Wheelchair Mobility    Modified Rankin (Stroke Patients Only)       Balance Overall balance assessment: Needs assistance Sitting-balance support: No upper extremity supported, Feet supported Sitting balance-Leahy Scale: Good     Standing balance support: No upper extremity supported, During functional activity Standing balance-Leahy Scale: Fair                   Standardized Balance Assessment Standardized Balance Assessment : Berg Balance Test Berg Balance Test Sit to Stand: Able to stand  independently using hands Standing Unsupported: Able to stand safely 2 minutes Sitting with Back Unsupported but Feet Supported on Floor or Stool: Able to sit safely and securely 2 minutes Stand to Sit: Controls descent by using hands Transfers: Able to transfer safely, definite need of hands Standing  Unsupported with Eyes Closed: Unable to keep eyes closed 3 seconds but stays steady Standing Ubsupported with Feet Together: Needs help to attain position but able to stand for 30 seconds with feet together From Standing,  Reach Forward with Outstretched Arm: Can reach forward >12 cm safely (5") From Standing Position, Pick up Object from Floor: Unable to pick up shoe, but reaches 2-5 cm (1-2") from shoe and balances independently From Standing Position, Turn to Look Behind Over each Shoulder: Looks behind from both sides and weight shifts well Turn 360 Degrees: Able to turn 360 degrees safely but slowly Standing Unsupported, Alternately Place Feet on Step/Stool: Able to complete >2 steps/needs minimal assist Standing Unsupported, One Foot in Front: Able to take small step independently and hold 30 seconds Standing on One Leg: Tries to lift leg/unable to hold 3 seconds but remains standing independently Total Score: 34         Pertinent Vitals/Pain Pain Assessment Pain Assessment: Faces Faces Pain Scale: Hurts a little bit Pain Location: Rt ankle Pain Descriptors / Indicators: Aching, Sore Pain Intervention(s): Limited activity within patient's tolerance, Monitored during session, Repositioned    Home Living Family/patient expects to be discharged to:: Private residence Living Arrangements: Spouse/significant other Available Help at Discharge: Family;Available 24 hours/day Type of Home: House Home Access: Level entry       Home Layout: One level Home Equipment: Rollator (4 wheels)      Prior Function Prior Level of Function : Independent/Modified Independent             Mobility Comments: Uses rollator periodically in the house       Hand Dominance   Dominant Hand: Right    Extremity/Trunk Assessment   Upper Extremity Assessment Upper Extremity Assessment: Defer to OT evaluation    Lower Extremity Assessment Lower Extremity Assessment: Generalized weakness (Reports bil neuropathy)       Communication   Communication: No difficulties  Cognition Arousal/Alertness: Awake/alert Behavior During Therapy: WFL for tasks assessed/performed Overall Cognitive Status: Within  Functional Limits for tasks assessed                                          General Comments      Exercises General Exercises - Lower Extremity Ankle Circles/Pumps: AROM, Both, 10 reps, Seated   Assessment/Plan    PT Assessment Patient needs continued PT services  PT Problem List Decreased strength;Decreased activity tolerance;Decreased balance;Decreased range of motion;Decreased mobility;Decreased coordination;Decreased knowledge of use of DME;Impaired sensation;Obesity;Pain       PT Treatment Interventions DME instruction;Gait training;Functional mobility training;Therapeutic activities;Therapeutic exercise;Balance training;Neuromuscular re-education;Patient/family education;Modalities    PT Goals (Current goals can be found in the Care Plan section)  Acute Rehab PT Goals Patient Stated Goal: Get well PT Goal Formulation: With patient Time For Goal Achievement: 09/29/22 Potential to Achieve Goals: Good    Frequency Min 3X/week     Co-evaluation               AM-PAC PT "6 Clicks" Mobility  Outcome Measure Help needed turning from your back to your side while in a flat bed without using bedrails?: None Help needed moving from lying on your back to sitting on the side of a flat bed without using bedrails?: None Help needed moving to and from a bed to a chair (including a wheelchair)?: A Little Help needed standing up from a chair  using your arms (e.g., wheelchair or bedside chair)?: A Little Help needed to walk in hospital room?: A Little Help needed climbing 3-5 steps with a railing? : A Little 6 Click Score: 20    End of Session Equipment Utilized During Treatment: Gait belt Activity Tolerance: Patient tolerated treatment well Patient left: in chair;with call bell/phone within reach Nurse Communication: Mobility status PT Visit Diagnosis: Unsteadiness on feet (R26.81);Other abnormalities of gait and mobility (R26.89);Repeated falls  (R29.6);Muscle weakness (generalized) (M62.81);History of falling (Z91.81);Difficulty in walking, not elsewhere classified (R26.2);Other symptoms and signs involving the nervous system (R29.898);Pain Pain - Right/Left: Right Pain - part of body: Ankle and joints of foot    Time: 1610-9604 PT Time Calculation (min) (ACUTE ONLY): 31 min   Charges:   PT Evaluation $PT Eval Low Complexity: 1 Low PT Treatments $Gait Training: 8-22 mins        Kathlyn Sacramento, PT, DPT Physical Therapist Acute Rehabilitation Services  [facility] [address] [place], [place] [address]  [phone] office [phone] fax [email or web address] 09/15/2022, 9:54 AM

## 2022-09-15 NOTE — Progress Notes (Signed)
Discharge instructions reviewed with pt.  Copy of instructions given to pt. No new scripts.  Pt d/c'd via wheelchair with belongings, with wife at the main entrance to pick pt up.          Escorted by staff/volunteer.   Kenzey Birkland,RN SWOT

## 2022-09-15 NOTE — Progress Notes (Signed)
Residential substance abuse resource list given to pt and explained.  Pt also seen at [facility], discussed they could potentially have options as well.  Permission given for CSW to call wife Coy Saunas, CSW discussed this with her as well. Daleen Squibb, MSW, LCSW 4/25/202412:00 PM

## 2022-09-15 NOTE — Discharge Summary (Signed)
Physician Discharge Summary  Francis Cardenas ZOX:[id number] DOB: [birth year] DOA: 09/14/2022  PCP: Benjiman Core, MD  Admit date: 09/14/2022 Discharge date: 09/15/2022  Admitted From: home  Disposition:  home   Recommendations for Outpatient Follow-up:  Follow up with PCP in 1-2 weeks Please obtain BMP/CBC in one week [facility]  Home Health: PT Equipment/Devices: Rollator  Discharge Condition: Stable CODE STATUS: Full code Diet recommendation: Low-salt, low-carb diet  Discharge summary: 72 year old with history of BPH, hypertension, type 2 diabetes, hyperlipidemia, COPD, sleep apnea on CPAP, paroxysmal A-fib on Eliquis, peripheral neuropathy who woke up yesterday early morning and had about 5-6 beers early morning, was walking across the hallway stumbled and fell.  In the emergency room hemodynamically stable.  Sodium 124, mild AKI.  Skeletal survey negative.  Unsteady with ambulation so admitted to hospital.  Hypokalemia: Dilutional.  Due to free water on beer.  Overnight corrected.  Sodium 131.  No indication for further investigation.  Dizziness and fall: Due to ambulatory dysfunction, peripheral neuropathy and alcohol consumption.  Alcohol 74 on arrival. No evidence of skeletal injury.  No evidence of central nervous system disease. Currently able to move around with rollator. Extensive counseling for alcohol cessation, he is not interested in outpatient meetings, he wants to explore inpatient rehab options with the [place] and he will follow-up with them. Patient currently stable without evidence of withdrawal.  He will continue multivitamins. He will work with PT and OT, did fairly well.  He will benefit with ongoing therapies at home and ultimately as outpatient therapies.  Multiple chronic medical issues remained stable.     Discharge Diagnoses:  Principal Problem:   Fall at home, initial encounter Active Problems:   Alcohol  dependence   Benign essential hypertension   COPD with asthma   Mixed hyperlipidemia   Type 2 diabetes mellitus with obesity   OSA (obstructive sleep apnea)   Morbid obesity   Persistent atrial fibrillation   Acute kidney injury superimposed on chronic kidney disease   BPH (benign prostatic hyperplasia)   Ambulatory dysfunction    Discharge Instructions  Discharge Instructions     Diet - low sodium heart healthy   Complete by: As directed    Discharge instructions   Complete by: As directed    Do not drink alcohol Call and schedule follow up with alcohol rehab centers   Increase activity slowly   Complete by: As directed    No wound care   Complete by: As directed       Allergies as of 09/15/2022       Reactions   Oxcarbazepine Other (See Comments)   Dizziness/ lightheaded   Zolpidem Nausea Only        Medication List     TAKE these medications    acetaminophen 325 MG tablet Commonly known as: Tylenol Take 2 tablets (650 mg total) by mouth every 6 (six) hours as needed.   albuterol 108 (90 Base) MCG/ACT inhaler Commonly known as: VENTOLIN HFA Inhale 2 puffs into the lungs every 6 (six) hours as needed for wheezing.   allopurinol 300 MG tablet Commonly known as: ZYLOPRIM Take 1 tablet by mouth daily.   allopurinol 100 MG tablet Commonly known as: ZYLOPRIM Take 100 mg by mouth daily.   ALPRAZolam 1 MG tablet Commonly known as: XANAX Take 1 mg by mouth at bedtime.   amLODipine 10 MG tablet Commonly known as: NORVASC Take 10 mg by mouth daily.  apixaban 5 MG Tabs tablet Commonly known as: ELIQUIS Take 1 tablet (5 mg total) by mouth 2 (two) times daily.   atorvastatin 80 MG tablet Commonly known as: LIPITOR Take 40 mg by mouth daily with supper.   fluticasone-salmeterol 250-50 MCG/ACT Aepb Commonly known as: Wixela Inhub Inhale 1 puff into the lungs in the morning and at bedtime.   gabapentin 300 MG capsule Commonly known as:  NEURONTIN Take 300 mg by mouth 3 (three) times daily.   hydrALAZINE 25 MG tablet Commonly known as: APRESOLINE Take 25 mg by mouth daily.   Jardiance 25 MG Tabs tablet Generic drug: empagliflozin Take 12.5 mg by mouth 2 (two) times daily.   Magnesium Oxide 420 MG Tabs Take 420 mg by mouth daily with supper.   metoprolol succinate 25 MG 24 hr tablet Commonly known as: TOPROL-XL Take 0.5 tablets (12.5 mg total) by mouth daily.   multivitamin with minerals tablet Take 1 tablet by mouth every Monday, Wednesday, and Friday.   omeprazole 20 MG capsule Commonly known as: PRILOSEC Take 20 mg by mouth daily with supper.   potassium chloride SA 20 MEQ tablet Commonly known as: KLOR-CON M Take 1 tablet (20 mEq total) by mouth daily with supper.   Spiriva Respimat 2.5 MCG/ACT Aers Generic drug: Tiotropium Bromide Monohydrate Inhale 2 puffs into the lungs daily.   spironolactone 25 MG tablet Commonly known as: ALDACTONE Take 25 mg by mouth every morning.   thiamine 100 MG tablet Commonly known as: Vitamin B-1 Take 100 mg by mouth daily.   torsemide 20 MG tablet Commonly known as: DEMADEX Take 20 mg by mouth 2 (two) times daily.   vitamin B-12 500 MCG tablet Commonly known as: CYANOCOBALAMIN Take 500 mcg by mouth daily with supper.        Allergies  Allergen Reactions   Oxcarbazepine Other (See Comments)    Dizziness/ lightheaded   Zolpidem Nausea Only    Consultations: None   Procedures/Studies: CT Thoracic Spine Wo Contrast  Result Date: 09/14/2022 CLINICAL DATA:  Fall EXAM: CT THORACIC AND LUMBAR SPINE WITHOUT CONTRAST TECHNIQUE: Multidetector CT imaging of the thoracic and lumbar spine was performed without contrast. Multiplanar CT image reconstructions were also generated. RADIATION DOSE REDUCTION: This exam was performed according to the departmental dose-optimization program which includes automated exposure control, adjustment of the mA and/or kV according  to patient size and/or use of iterative reconstruction technique. COMPARISON:  CT lumbar spine 02/19/2021, no prior CT of the thoracic spine FINDINGS: CT THORACIC SPINE FINDINGS Alignment: Mild dextrocurvature. Straightening of the normal thoracic kyphosis. Vertebrae: No acute fracture or suspicious osseous lesion. Minimal chronic wedging of T7-T9. Paraspinal and other soft tissues: Aortic atherosclerosis. Calcified ductus diverticulum. No focal pulmonary opacity or pleural effusion in the imaged lungs. Disc levels: Multilevel degenerative changes, with mild spinal canal stenosis at T10-T11. Mild neural foraminal narrowing at T1-T2 bilaterally. Moderate right neural foraminal narrowing at T10-T11 and mild left neural foraminal narrowing. CT LUMBAR SPINE FINDINGS Segmentation: 5 lumbar type vertebral bodies. Alignment: Mild levocurvature.  No significant listhesis. Vertebrae: No acute fracture or suspicious osseous lesion. Paraspinal and other soft tissues: Abdominal aortic atherosclerosis. Disc levels: Multilevel degenerative changes, with moderate spinal canal stenosis at L2-L3 and moderate to severe spinal canal stenosis at L3-L4. IMPRESSION: 1. No acute fracture or traumatic listhesis in the thoracic or lumbar spine. 2. Multilevel degenerative changes, with mild spinal canal stenosis at T10-T11, moderate spinal canal stenosis at L2-L3, and moderate to severe spinal canal stenosis.  This could be further evaluated with an MRI if clinically indicated. 3. Aortic atherosclerosis. Aortic Atherosclerosis (ICD10-I70.0). Electronically Signed   By: Wiliam Ke M.D.   On: 09/14/2022 14:35   CT Lumbar Spine Wo Contrast  Result Date: 09/14/2022 CLINICAL DATA:  Fall EXAM: CT THORACIC AND LUMBAR SPINE WITHOUT CONTRAST TECHNIQUE: Multidetector CT imaging of the thoracic and lumbar spine was performed without contrast. Multiplanar CT image reconstructions were also generated. RADIATION DOSE REDUCTION: This exam was  performed according to the departmental dose-optimization program which includes automated exposure control, adjustment of the mA and/or kV according to patient size and/or use of iterative reconstruction technique. COMPARISON:  CT lumbar spine 02/19/2021, no prior CT of the thoracic spine FINDINGS: CT THORACIC SPINE FINDINGS Alignment: Mild dextrocurvature. Straightening of the normal thoracic kyphosis. Vertebrae: No acute fracture or suspicious osseous lesion. Minimal chronic wedging of T7-T9. Paraspinal and other soft tissues: Aortic atherosclerosis. Calcified ductus diverticulum. No focal pulmonary opacity or pleural effusion in the imaged lungs. Disc levels: Multilevel degenerative changes, with mild spinal canal stenosis at T10-T11. Mild neural foraminal narrowing at T1-T2 bilaterally. Moderate right neural foraminal narrowing at T10-T11 and mild left neural foraminal narrowing. CT LUMBAR SPINE FINDINGS Segmentation: 5 lumbar type vertebral bodies. Alignment: Mild levocurvature.  No significant listhesis. Vertebrae: No acute fracture or suspicious osseous lesion. Paraspinal and other soft tissues: Abdominal aortic atherosclerosis. Disc levels: Multilevel degenerative changes, with moderate spinal canal stenosis at L2-L3 and moderate to severe spinal canal stenosis at L3-L4. IMPRESSION: 1. No acute fracture or traumatic listhesis in the thoracic or lumbar spine. 2. Multilevel degenerative changes, with mild spinal canal stenosis at T10-T11, moderate spinal canal stenosis at L2-L3, and moderate to severe spinal canal stenosis. This could be further evaluated with an MRI if clinically indicated. 3. Aortic atherosclerosis. Aortic Atherosclerosis (ICD10-I70.0). Electronically Signed   By: Wiliam Ke M.D.   On: 09/14/2022 14:35   DG Hip Unilat W or Wo Pelvis 2-3 Views Left  Result Date: 09/14/2022 CLINICAL DATA:  Fall this morning. EXAM: DG HIP (WITH OR WITHOUT PELVIS) 2-3V LEFT COMPARISON:  01/11/2022  FINDINGS: Prostate fiducials noted. Mild craniocaudad degenerative loss of articular space in both hips. No fracture or acute bony findings noted. IMPRESSION: 1. No acute findings. 2. Mild degenerative loss of articular space in both hips. Electronically Signed   By: Gaylyn Rong M.D.   On: 09/14/2022 11:14   DG Chest Portable 1 View  Result Date: 09/14/2022 CLINICAL DATA:  Fall, left hip pain, dizziness and blurred vision. EXAM: PORTABLE CHEST 1 VIEW COMPARISON:  06/17/2022 FINDINGS: The lungs appear clear. Cardiac and mediastinal contours normal. No pleural effusion identified. Mid and lower thoracic spondylosis. IMPRESSION: 1. No active cardiopulmonary disease is radiographically apparent. 2. Mid and lower thoracic spondylosis. Electronically Signed   By: Gaylyn Rong M.D.   On: 09/14/2022 11:12   DG Ankle Complete Right  Result Date: 09/14/2022 CLINICAL DATA:  Fall, right ankle pain EXAM: RIGHT ANKLE - COMPLETE 3+ VIEW COMPARISON:  01/11/2022 FINDINGS: Radiodensities associated with bandaging or sock noted along the heel and foot. Chronic ossicles below the medial malleolus similar to 8/20 2/23. These appear well corticated. Chronic lucent osteochondral lesion of the medial talar dome with sclerotic margin. No fracture or acute bony findings identified. Posterior spurring along the posterior subtalar joint. Dorsal spurring at the talonavicular articulation. Mild midfoot degenerative spurring. Faint linear calcifications project over the distal Achilles tendon and could reflect mild calcific tendinopathy. IMPRESSION: 1. No acute bony findings.  2. 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 3. Chronic ossicles below the medial malleolus likely from remote trauma. 4. Mild calcific tendinopathy of the distal Achilles tendon. 5. Degenerative findings in the midfoot and hindfoot. 6. Radiodensities associated with bandaging or sock noted along the heel and foot. Electronically Signed    By: Gaylyn Rong M.D.   On: 09/14/2022 11:11   CT HEAD WO CONTRAST ( )  Result Date: 09/14/2022 CLINICAL DATA:  Provided history: Head trauma, abnormal mental status. Neck trauma. Fall. EXAM: CT HEAD WITHOUT CONTRAST CT CERVICAL SPINE WITHOUT CONTRAST TECHNIQUE: Multidetector CT imaging of the head and cervical spine was performed following the standard protocol without intravenous contrast. Multiplanar CT image reconstructions of the cervical spine were also generated. RADIATION DOSE REDUCTION: This exam was performed according to the departmental dose-optimization program which includes automated exposure control, adjustment of the mA and/or kV according to patient size and/or use of iterative reconstruction technique. COMPARISON:  Head CT 06/05/2020 brain MRI 12/16/2019. Cervical spine MRI 12/16/2019. FINDINGS: CT HEAD FINDINGS Brain: Generalized cerebral atrophy. Patchy and ill-defined hypoattenuation within the cerebral white matter, nonspecific but compatible with mild-to-moderate chronic small vessel ischemic disease. There is no acute intracranial hemorrhage. No demarcated cortical infarct. No extra-axial fluid collection. No evidence of an intracranial mass. No midline shift. Vascular: No hyperdense vessel.  Atherosclerotic calcifications. Skull: No fracture or aggressive osseous lesion. Sinuses/Orbits: No mass or acute finding within the imaged orbits. Trace mucosal thickening within the bilateral ethmoid sinuses. CT CERVICAL SPINE FINDINGS Alignment: Straightening of the expected cervical lordosis. No significant spondylolisthesis. Skull base and vertebrae: The basion-dental and atlanto-dental intervals are maintained.No evidence of acute fracture to the cervical spine. Soft tissues and spinal canal: No prevertebral fluid or swelling. No visible canal hematoma. Disc levels: Cervical spondylosis with multilevel disc space narrowing, disc bulges/disc protrusions, posterior disc osteophyte  complexes, uncovertebral hypertrophy and facet arthrosis. Multilevel spinal canal stenosis. Most notably, a posterior disc osteophyte complex contributes to suspected moderate spinal canal stenosis at C5-C6. Multilevel bony neural foraminal narrowing. Upper chest: No consolidation within the imaged lung apices. No visible pneumothorax. IMPRESSION: CT head: 1.  No evidence of an acute intracranial abnormality. 2. Parenchymal atrophy and chronic small vessel ischemic disease. CT cervical spine: 1. No evidence of acute fracture to the cervical spine. 2. Nonspecific straightening of the expected cervical lordosis. 3. Cervical spondylosis, as described. Electronically Signed   By: Jackey Loge D.O.   On: 09/14/2022 10:27   CT Cervical Spine Wo Contrast  Result Date: 09/14/2022 CLINICAL DATA:  Provided history: Head trauma, abnormal mental status. Neck trauma. Fall. EXAM: CT HEAD WITHOUT CONTRAST CT CERVICAL SPINE WITHOUT CONTRAST TECHNIQUE: Multidetector CT imaging of the head and cervical spine was performed following the standard protocol without intravenous contrast. Multiplanar CT image reconstructions of the cervical spine were also generated. RADIATION DOSE REDUCTION: This exam was performed according to the departmental dose-optimization program which includes automated exposure control, adjustment of the mA and/or kV according to patient size and/or use of iterative reconstruction technique. COMPARISON:  Head CT 06/05/2020 brain MRI 12/16/2019. Cervical spine MRI 12/16/2019. FINDINGS: CT HEAD FINDINGS Brain: Generalized cerebral atrophy. Patchy and ill-defined hypoattenuation within the cerebral white matter, nonspecific but compatible with mild-to-moderate chronic small vessel ischemic disease. There is no acute intracranial hemorrhage. No demarcated cortical infarct. No extra-axial fluid collection. No evidence of an intracranial mass. No midline shift. Vascular: No hyperdense vessel.  Atherosclerotic  calcifications. Skull: No fracture or aggressive osseous lesion. Sinuses/Orbits:  No mass or acute finding within the imaged orbits. Trace mucosal thickening within the bilateral ethmoid sinuses. CT CERVICAL SPINE FINDINGS Alignment: Straightening of the expected cervical lordosis. No significant spondylolisthesis. Skull base and vertebrae: The basion-dental and atlanto-dental intervals are maintained.No evidence of acute fracture to the cervical spine. Soft tissues and spinal canal: No prevertebral fluid or swelling. No visible canal hematoma. Disc levels: Cervical spondylosis with multilevel disc space narrowing, disc bulges/disc protrusions, posterior disc osteophyte complexes, uncovertebral hypertrophy and facet arthrosis. Multilevel spinal canal stenosis. Most notably, a posterior disc osteophyte complex contributes to suspected moderate spinal canal stenosis at C5-C6. Multilevel bony neural foraminal narrowing. Upper chest: No consolidation within the imaged lung apices. No visible pneumothorax. IMPRESSION: CT head: 1.  No evidence of an acute intracranial abnormality. 2. Parenchymal atrophy and chronic small vessel ischemic disease. CT cervical spine: 1. No evidence of acute fracture to the cervical spine. 2. Nonspecific straightening of the expected cervical lordosis. 3. Cervical spondylosis, as described. Electronically Signed   By: Jackey Loge D.O.   On: 09/14/2022 10:27   (Echo, Carotid, EGD, Colonoscopy, ERCP)    Subjective: Patient seen in the morning rounds walking around with physical therapy.  He is steady with use of rollator. We discussed in detail about his alcohol problems, he understands it is a problem.  We also consulted social worker to give him resources.  He is not interested as outpatient meetings.  He wants to go inpatient rehab.  He will work with the [place] to get this done.  His wife is at home all the time.  He is safe to discharge.   Discharge Exam: Vitals:   09/15/22 0900  09/15/22 0905  BP:    Pulse:    Resp:    Temp:    SpO2: 98% 98%   Vitals:   09/15/22 0425 09/15/22 0747 09/15/22 0900 09/15/22 0905  BP:  (!) 109/47    Pulse:  65    Resp:  18    Temp:  98.4 F (36.9 C)    TempSrc:      SpO2:  98% 98% 98%  Weight: 133.7 kg     Height:        General: Pt is alert, awake, not in acute distress Cardiovascular: RRR, S1/S2 +, no rubs, no gallops Respiratory: CTA bilaterally, no wheezing, no rhonchi Abdominal: Soft, NT, ND, bowel sounds + Extremities: no edema, no cyanosis    The results of significant diagnostics from this hospitalization (including imaging, microbiology, ancillary and laboratory) are listed below for reference.     Microbiology: No results found for this or any previous visit (from the past 240 hour(s)).   Labs: BNP (last 3 results) Recent Labs    04/05/22 1444 06/14/22 2004  BNP 157.8* 129.7*   Basic Metabolic Panel: Recent Labs  Lab 09/14/22 0940 09/15/22 0331  NA 124* 132*  K 4.8 4.0  CL 94* 100  CO2 17* 24  GLUCOSE 93 138*  BUN 38* 32*  CREATININE 1.76* 1.53*  CALCIUM 10.5* 10.3   Liver Function Tests: Recent Labs  Lab 09/14/22 0940  AST 23  ALT 24  ALKPHOS 82  BILITOT 1.0  PROT 6.7  ALBUMIN 4.1   No results for input(s): "LIPASE", "AMYLASE" in the last 168 hours. No results for input(s): "AMMONIA" in the last 168 hours. CBC: Recent Labs  Lab 09/14/22 0940 09/15/22 0331  WBC 8.0 5.5  NEUTROABS 5.5  --   HGB 12.5* 11.6*  HCT 38.6* 34.6*  MCV 98.0 94.0  PLT 167 150   Cardiac Enzymes: No results for input(s): "CKTOTAL", "CKMB", "CKMBINDEX", "TROPONINI" in the last 168 hours. BNP: Invalid input(s): "POCBNP" CBG: Recent Labs  Lab 09/14/22 2305 09/15/22 0744  GLUCAP 143* 105*   D-Dimer No results for input(s): "DDIMER" in the last 72 hours. Hgb A1c No results for input(s): "HGBA1C" in the last 72 hours. Lipid Profile No results for input(s): "CHOL", "HDL", "LDLCALC", "TRIG",  "CHOLHDL", "LDLDIRECT" in the last 72 hours. Thyroid function studies No results for input(s): "TSH", "T4TOTAL", "T3FREE", "THYROIDAB" in the last 72 hours.  Invalid input(s): "FREET3" Anemia work up No results for input(s): "VITAMINB12", "FOLATE", "FERRITIN", "TIBC", "IRON", "RETICCTPCT" in the last 72 hours. Urinalysis    Component Value Date/Time   COLORURINE YELLOW 06/15/2022 1927   APPEARANCEUR CLEAR 06/15/2022 1927   LABSPEC 1.013 06/15/2022 1927   PHURINE 5.0 06/15/2022 1927   GLUCOSEU 50 (A) 06/15/2022 1927   HGBUR NEGATIVE 06/15/2022 1927   BILIRUBINUR NEGATIVE 06/15/2022 1927   KETONESUR NEGATIVE 06/15/2022 1927   PROTEINUR NEGATIVE 06/15/2022 1927   UROBILINOGEN 0.2 03/24/2013 1255   NITRITE NEGATIVE 06/15/2022 1927   LEUKOCYTESUR NEGATIVE 06/15/2022 1927   Sepsis Labs Recent Labs  Lab 09/14/22 0940 09/15/22 0331  WBC 8.0 5.5   Microbiology No results found for this or any previous visit (from the past 240 hour(s)).   Time coordinating discharge:  32 minutes  SIGNED:   Dorcas Carrow, MD  [facility] 09/15/2022, 10:34 AM

## 2022-09-21 ENCOUNTER — Telehealth (HOSPITAL_BASED_OUTPATIENT_CLINIC_OR_DEPARTMENT_OTHER): Payer: Self-pay | Admitting: Pulmonary Disease

## 2022-09-21 NOTE — Telephone Encounter (Signed)
Patient states his [facility] has community [facility] and states Dr. Everardo All needs to send a over form of approval for patient. Send a fax in to community care [facility] for Pulmonary Physical Therapy with [place] so [facility] will cover the costs for the patient as they are doing with Dr. Everardo All visits.   Could not find fax number, phone number [phone]

## 2022-09-27 NOTE — Telephone Encounter (Addendum)
Francis Cardenas,  Could you help me clarify if patient's [facility] PCP needs submit form for community care or if this can be completed by me, a non-VA physician.  As noted on 08/25/22, [facility] didnt approve my prescriptions or pulmonary rehab because he was not part of community care.

## 2022-10-06 NOTE — Telephone Encounter (Signed)
Request for auth sent to the [facility]

## 2022-10-11 ENCOUNTER — Other Ambulatory Visit: Payer: Self-pay

## 2022-10-11 ENCOUNTER — Emergency Department (HOSPITAL_COMMUNITY): Payer: No Typology Code available for payment source

## 2022-10-11 ENCOUNTER — Emergency Department (HOSPITAL_COMMUNITY)
Admission: EM | Admit: 2022-10-11 | Discharge: 2022-10-11 | Disposition: A | Discharge status: Home / Self Care | Payer: No Typology Code available for payment source | Attending: Emergency Medicine | Admitting: Emergency Medicine

## 2022-10-11 ENCOUNTER — Encounter (HOSPITAL_COMMUNITY): Payer: Self-pay | Admitting: Emergency Medicine

## 2022-10-11 DIAGNOSIS — Z7901 Long term (current) use of anticoagulants: Secondary | ICD-10-CM | POA: Diagnosis not present

## 2022-10-11 DIAGNOSIS — Z8546 Personal history of malignant neoplasm of prostate: Secondary | ICD-10-CM | POA: Diagnosis not present

## 2022-10-11 DIAGNOSIS — M542 Cervicalgia: Secondary | ICD-10-CM | POA: Diagnosis not present

## 2022-10-11 DIAGNOSIS — Z87891 Personal history of nicotine dependence: Secondary | ICD-10-CM | POA: Insufficient documentation

## 2022-10-11 DIAGNOSIS — S0990XA Unspecified injury of head, initial encounter: Secondary | ICD-10-CM | POA: Diagnosis present

## 2022-10-11 DIAGNOSIS — W19XXXA Unspecified fall, initial encounter: Secondary | ICD-10-CM

## 2022-10-11 DIAGNOSIS — E119 Type 2 diabetes mellitus without complications: Secondary | ICD-10-CM | POA: Insufficient documentation

## 2022-10-11 DIAGNOSIS — S60511A Abrasion of right hand, initial encounter: Secondary | ICD-10-CM | POA: Diagnosis not present

## 2022-10-11 DIAGNOSIS — S0001XA Abrasion of scalp, initial encounter: Secondary | ICD-10-CM | POA: Insufficient documentation

## 2022-10-11 DIAGNOSIS — I10 Essential (primary) hypertension: Secondary | ICD-10-CM | POA: Insufficient documentation

## 2022-10-11 DIAGNOSIS — I4891 Unspecified atrial fibrillation: Secondary | ICD-10-CM | POA: Diagnosis not present

## 2022-10-11 DIAGNOSIS — S61411A Laceration without foreign body of right hand, initial encounter: Secondary | ICD-10-CM | POA: Diagnosis not present

## 2022-10-11 DIAGNOSIS — J449 Chronic obstructive pulmonary disease, unspecified: Secondary | ICD-10-CM | POA: Insufficient documentation

## 2022-10-11 DIAGNOSIS — W0110XA Fall on same level from slipping, tripping and stumbling with subsequent striking against unspecified object, initial encounter: Secondary | ICD-10-CM | POA: Diagnosis not present

## 2022-10-11 DIAGNOSIS — M545 Low back pain, unspecified: Secondary | ICD-10-CM | POA: Diagnosis not present

## 2022-10-11 NOTE — ED Notes (Signed)
Called wife for transportation home. No answer. Pt left vm.

## 2022-10-11 NOTE — ED Notes (Signed)
Trauma Response Nurse Documentation   Francis Cardenas is a 72 y.o. male arriving to [facility] ED via EMS  On Eliquis (apixaban) daily. Trauma was activated as a Level 2 by ED charge RN based on the following trauma criteria Elderly patients > 65 with head trauma on anti-coagulation (excluding ASA).  Patient cleared for CT by Dr. Pilar Plate EDP. Pt transported to CT with trauma response nurse present to monitor. RN remained with the patient throughout their absence from the department for clinical observation.   GCS 15.  History   Past Medical History:  Diagnosis Date   Alcohol dependence (HCC)    BPH (benign prostatic hyperplasia)    Cancer (HCC)    prostate   COPD (chronic obstructive pulmonary disease) (HCC)    Diabetes mellitus without complication (HCC)    Hypercholesteremia    Hypertension    OSA on CPAP      Past Surgical History:  Procedure Laterality Date   ACHILLES TENDON REPAIR Left    KNEE ARTHROSCOPY Left    RIGHT/LEFT HEART CATH AND CORONARY ANGIOGRAPHY N/A 04/09/2019   Procedure: RIGHT/LEFT HEART CATH AND CORONARY ANGIOGRAPHY;  Surgeon: [place], Peter M, MD;  Location: [facility] INVASIVE CV LAB;  Service: Cardiovascular;  Laterality: N/A;   TONSILLECTOMY     WRIST FRACTURE SURGERY Left        Initial Focused Assessment (If applicable, or please see trauma documentation): Alert/oriented male presents via EMS from home after a fall resulting in skin tears to right hand and the top of his head. Complains of back pain, neck pain, and bilateral hip pain. Ambulatory on scene.  Airway patent/unobstructed, BS diminished No obvious uncontrolled hemorrhage, bleeding to right hand and head controlled with EMS guaze dressing GCS 15  CT's Completed:   CT Head and CT C-Spine  CT lumbar/thoracic spine  Interventions:  Portable chest, pelvis and right hand XRAYS CT head/cervical/thoracic/lumbar spine  Plan for disposition:  Discharge home anticipated  Consults completed:  none    Event Summary: Presents via EMS from home after a ground level fall - states he tripped when using a Rolator type walker. Reports neck pain, back pain and bilateral hip pain.    Bedside handoff with ED RN Fredric Mare.    Katia Hannen O Other [place]  Trauma Response RN  Please call TRN at [phone] for further assistance.

## 2022-10-11 NOTE — ED Notes (Signed)
Abrasions to left knee, right posterior hand x2, and forehead irrigated with saline. Dressed with non-adherent dressing.

## 2022-10-11 NOTE — ED Provider Notes (Signed)
[facility] Emergency Department Provider Note MRN:  [id number]  Arrival date & time: 10/11/22     Chief Complaint   Trauma (Level 2) and Fall   History of Present Illness   Francis Cardenas is a 72 y.o. year-old male with a history of A-fib, COPD, diabetes presenting to the ED with chief complaint of fall.  Tripped and fell, hit his head.  On Eliquis.  Endorsing neck and back pain.  Abrasions to the right hand.  Review of Systems  A thorough review of systems was obtained and all systems are negative except as noted in the HPI and PMH.   Patient's Health History    Past Medical History:  Diagnosis Date   Alcohol dependence (HCC)    BPH (benign prostatic hyperplasia)    Cancer (HCC)    prostate   COPD (chronic obstructive pulmonary disease) (HCC)    Diabetes mellitus without complication (HCC)    Hypercholesteremia    Hypertension    OSA on CPAP     Past Surgical History:  Procedure Laterality Date   ACHILLES TENDON REPAIR Left    KNEE ARTHROSCOPY Left    RIGHT/LEFT HEART CATH AND CORONARY ANGIOGRAPHY N/A 04/09/2019   Procedure: RIGHT/LEFT HEART CATH AND CORONARY ANGIOGRAPHY;  Surgeon: [place], Peter M, MD;  Location: [facility];  Service: Cardiovascular;  Laterality: N/A;   TONSILLECTOMY     WRIST FRACTURE SURGERY Left     Family History  Problem Relation Age of Onset   CAD Mother    Leukemia Mother    Heart attack Father    Heart attack Brother     Social History   Socioeconomic History   Marital status: Married    Spouse name: Not on file   Number of children: Not on file   Years of education: Not on file   Highest education level: Not on file  Occupational History   Not on file  Tobacco Use   Smoking status: Former    Packs/day: 1.00    Years: 13.00    Additional pack years: 0.00    Total pack years: 13.00    Types: Cigarettes    Quit date: 1    Years since quitting: 44.4    Passive exposure: Never   Smokeless  tobacco: Never  Vaping Use   Vaping Use: Never used  Substance and Sexual Activity   Alcohol use: Yes    Alcohol/week: 8.0 standard drinks of alcohol    Types: 8 Cans of beer per week    Comment: 18 beers per day   Drug use: No   Sexual activity: Not Currently  Other Topics Concern   Not on file  Social History Narrative   Not on file   Social Determinants of Health   Financial Resource Strain: Not on file  Food Insecurity: Unknown (06/15/2022)   Hunger Vital Sign    Worried About Running Out of Food in the Last Year: Not on file    Ran Out of Food in the Last Year: Never true  Transportation Needs: No Transportation Needs (06/15/2022)   PRAPARE - Administrator, Civil Service (Medical): No    Lack of Transportation (Non-Medical): No  Physical Activity: Not on file  Stress: Not on file  Social Connections: Not on file  Intimate Partner Violence: Unknown (06/15/2022)   Humiliation, Afraid, Rape, and Kick questionnaire    Fear of Current or Ex-Partner: No    Emotionally Abused: No  Physically Abused: Not on file    Sexually Abused: No     Physical Exam   Vitals:   10/11/22 0545 10/11/22 0600  BP: (!) 115/47 (!) 101/51  Pulse: (!) 56 60  Resp: 14 15  Temp:    SpO2: 96% 92%    CONSTITUTIONAL: Well-appearing, NAD NEURO/PSYCH:  Alert and oriented x 3, no focal deficits EYES:  eyes equal and reactive ENT/NECK:  no LAD, no JVD CARDIO: Regular rate, well-perfused, normal S1 and S2 PULM:  CTAB no wheezing or rhonchi GI/GU:  non-distended, non-tender MSK/SPINE:  No gross deformities, no edema SKIN:  no rash, atraumatic   *Additional and/or pertinent findings included in MDM below  Diagnostic and Interventional Summary    EKG Interpretation  Date/Time:  Tuesday Oct 11 2022 04:37:23 EDT Ventricular Rate:  57 PR Interval:    QRS Duration: 115 QT Interval:  439 QTC Calculation: 428 R Axis:   90 Text Interpretation: Atrial fibrillation Nonspecific  intraventricular conduction delay Low voltage, extremity and precordial leads Confirmed by Kennis Carina [phone]) on 10/11/2022 5:44:46 AM       Labs Reviewed - No data to display  CT HEAD WO CONTRAST ( )  Final Result    CT CERVICAL SPINE WO CONTRAST  Final Result    CT Thoracic Spine Wo Contrast  Final Result    CT Lumbar Spine Wo Contrast  Final Result    DG Chest Port 1 View  Final Result    DG Pelvis Portable  Final Result    DG Hand Complete Right  Final Result      Medications - No data to display   Procedures  /  Critical Care Procedures  ED Course and Medical Decision Making  Initial Impression and Ddx Mechanical fall, level 2 trauma due to blood thinners and head trauma.  Abrasion to the scalp, skin tears to the right hand.  Normal and symmetric strength and sensation to the extremities.  No chest or abdominal pain.  Awaiting imaging.  Past medical/surgical history that increases complexity of ED encounter: A-fib, anticoagulated  Interpretation of Diagnostics I personally reviewed the EKG and my interpretation is as follows: A-fib  Imaging is without significant traumatic injury  Patient Reassessment and Ultimate Disposition/Management     Discharge  Patient management required discussion with the following services or consulting groups:  None  Complexity of Problems Addressed Acute illness or injury that poses threat of life of bodily function  Additional Data Reviewed and Analyzed Further history obtained from: EMS on arrival  Additional Factors Impacting ED Encounter Risk Consideration of hospitalization  Elmer Sow. Pilar Plate, MD [facility] Emergency Medicine [facility] [email or web address]  Final Clinical Impressions(s) / ED Diagnoses     ICD-10-CM   1. Fall, initial encounter  [email or web address]       ED Discharge Orders     None        Discharge Instructions Discussed with and Provided to Patient:     Discharge  Instructions      You were evaluated in the Emergency Department and after careful evaluation, we did not find any emergent condition requiring admission or further testing in the hospital.  Your exam/testing today was overall reassuring.  Please return to the Emergency Department if you experience any worsening of your condition.  Thank you for allowing [place] to be a part of your care.        Sabas Sous, MD 10/11/22 [phone]

## 2022-10-11 NOTE — Discharge Instructions (Signed)
You were evaluated in the Emergency Department and after careful evaluation, we did not find any emergent condition requiring admission or further testing in the hospital.  Your exam/testing today was overall reassuring.  Please return to the Emergency Department if you experience any worsening of your condition.  Thank you for allowing us to be a part of your care.  

## 2022-10-11 NOTE — ED Triage Notes (Signed)
Level 2 Fall on Blood Thinner  Pt arrives via [facility] from home following mechanical fall while pt was walking to the bathroom. Pt presents with abrasion to head above mid forehead and skin tears to right hand. Also c/o back and bilateral hip pain. A&Ox4. EMS report pt was ambulatory on scene. Pt denies LOC.

## 2022-10-11 NOTE — ED Notes (Signed)
Voice message left for Margo(wife) of pt's discharge.

## 2022-10-11 NOTE — ED Notes (Signed)
Pt transported to CT by TRN 

## 2022-10-12 ENCOUNTER — Encounter (HOSPITAL_BASED_OUTPATIENT_CLINIC_OR_DEPARTMENT_OTHER): Payer: Self-pay

## 2022-10-12 ENCOUNTER — Telehealth (HOSPITAL_COMMUNITY): Payer: Self-pay

## 2022-10-12 NOTE — Telephone Encounter (Signed)
Recv'ed pt Francis Cardenas, [id number].

## 2022-10-12 NOTE — Telephone Encounter (Signed)
Message sent to the [place] they have the referral form and will send it to Rehab if they do not do Rehab and the [place]

## 2022-10-13 ENCOUNTER — Encounter (HOSPITAL_BASED_OUTPATIENT_CLINIC_OR_DEPARTMENT_OTHER): Payer: Self-pay | Admitting: Pulmonary Disease

## 2022-10-13 ENCOUNTER — Encounter (HOSPITAL_BASED_OUTPATIENT_CLINIC_OR_DEPARTMENT_OTHER): Payer: Medicare HMO

## 2022-10-13 ENCOUNTER — Ambulatory Visit (HOSPITAL_BASED_OUTPATIENT_CLINIC_OR_DEPARTMENT_OTHER): Payer: No Typology Code available for payment source | Admitting: Pulmonary Disease

## 2022-10-13 VITALS — BP 124/60 | HR 65 | Temp 98.5°F | Ht 70.0 in | Wt 302.0 lb

## 2022-10-13 DIAGNOSIS — J4489 Other specified chronic obstructive pulmonary disease: Secondary | ICD-10-CM | POA: Diagnosis not present

## 2022-10-13 NOTE — Patient Instructions (Signed)
COPD-asthma overlap - remains symptomatic, not in exacerbation --CONTINUE Wixela 250-50 mcg ONE puff in the morning and evening. Rinse mouth out after use --CONTINUE Spiriva Respimat TWO puffs ONCE a day --CONTINUE Albuterol TWO puffs AS NEEDED --Encourage regular aerobic activity including walking and upper body resistance exercises for 20-30 minutes --[facility]  OSA on CPAP --Managed by [place]

## 2022-10-13 NOTE — Telephone Encounter (Signed)
Patient reports community care did contact patient yesterday morning stating that he is now approved for pulmonary rehab   Closing encounter.

## 2022-10-13 NOTE — Progress Notes (Addendum)
Subjective:   PATIENT ID: Francis Cardenas GENDER: male DOB: 03/02/1951, MRN: [id number]  Chief Complaint  Patient presents with   Follow-up    Follow up.    Reason for Visit: Follow-up  [name] is a 72 year old male with COPD (no PFTs), childhood asthma, OSA on CPAP,  chronic diastolic heart failure, paroxysmal atrial fibrillation on Eliquis, HTN, HLD, hx melanoma, hx prostate cancer who presents for acute visit  07/14/22 Hospital follow-up. Admitted for COPD/asthma exacerbation, acute hyponatremia and AKI. Treated with doxycycline, solumedrol>prednisone. Did not require O2 discharge. Currently doing well since discharge. He was diagnosed with COPD ~20 years ago. Uses Stiolto for the last year. Uses albuterol once a week. He usually needs prednisone annually for exacerbations. His last hospitalization related to COPD was 05/2022 and before this was in 02/2019 requiring intubation. Denies shortness of breath, cough or wheezing at baseline. He has had multiple hospitalizations related to heart failure and severe gout in 2023. He is sedentary at baseline and uses an Art gallery manager for long distances.  08/24/22 Since our last visit he reports wheezing, chest congestion and shortness of breath. Denies fevers, chills. He reports history of childhood asthma. Has had worsening symptoms in the last few weeks. No known sick contacts. Compliant with Stiolto but needing to use albuterol more frequently including in the evenings.  10/13/22 [facility] stating that he is now approved for pulmonary rehab. He had a fall recently on 4/24, imaging with no acute fracture. Currently using a rollator for ambulation. Also had another fall two days ago as he current home arrangement causes him to not use his rollator and have to use the wall and furniture to ambulate within the house. He feels his breathing is worse after his fall since he has been  less active. But overall it has improved since using his Wixela and Spiriva. Rarely using rescue inhaler now.   Social History: Former smoker. 13 pack years. Quit in 1980. 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  Past Medical History:  Diagnosis Date   Alcohol dependence (HCC)    BPH (benign prostatic hyperplasia)    Cancer (HCC)    prostate   COPD (chronic obstructive pulmonary disease) (HCC)    Diabetes mellitus without complication (HCC)    Hypercholesteremia    Hypertension    OSA on CPAP      Family History  Problem Relation Age of Onset   CAD Mother    Leukemia Mother    Heart attack Father    Heart attack Brother      Social History   Occupational History   Not on file  Tobacco Use   Smoking status: Former    Packs/day: 1.00    Years: 13.00    Additional pack years: 0.00    Total pack years: 13.00    Types: Cigarettes    Quit date: 1980    Years since quitting: 44.4    Passive exposure: Never   Smokeless tobacco: Never  Vaping Use   Vaping Use: Never used  Substance and Sexual Activity   Alcohol use: Yes    Alcohol/week: 8.0 standard drinks of alcohol    Types: 8 Cans of beer per week    Comment: 18 beers per day   Drug use: No   Sexual activity: Not Currently    Allergies  Allergen Reactions   Oxcarbazepine Other (See Comments)    Dizziness/ lightheaded  Zolpidem Nausea Only     Outpatient Medications Prior to Visit  Medication Sig Dispense Refill   acetaminophen (TYLENOL) 325 MG tablet Take 2 tablets (650 mg total) by mouth every 6 (six) hours as needed. 36 tablet 0   albuterol (PROVENTIL HFA;VENTOLIN HFA) 108 (90 BASE) MCG/ACT inhaler Inhale 2 puffs into the lungs every 6 (six) hours as needed for wheezing.     allopurinol (ZYLOPRIM) 100 MG tablet Take 100 mg by mouth daily.     allopurinol (ZYLOPRIM) 300 MG tablet Take 1 tablet by mouth daily.     ALPRAZolam (XANAX) 1 MG tablet Take 1 mg by mouth at bedtime.     amLODipine  (NORVASC) 10 MG tablet Take 10 mg by mouth daily.     apixaban (ELIQUIS) 5 MG TABS tablet Take 1 tablet (5 mg total) by mouth 2 (two) times daily. 180 tablet 1   atorvastatin (LIPITOR) 80 MG tablet Take 40 mg by mouth daily with supper.     empagliflozin (JARDIANCE) 25 MG TABS tablet Take 12.5 mg by mouth 2 (two) times daily.     fluticasone-salmeterol (WIXELA INHUB) 250-50 MCG/ACT AEPB Inhale 1 puff into the lungs in the morning and at bedtime. 60 each 5   gabapentin (NEURONTIN) 300 MG capsule Take 300 mg by mouth 3 (three) times daily.     hydrALAZINE (APRESOLINE) 25 MG tablet Take 25 mg by mouth daily.     Magnesium Oxide 420 MG TABS Take 420 mg by mouth daily with supper.     metoprolol succinate (TOPROL-XL) 25 MG 24 hr tablet Take 0.5 tablets (12.5 mg total) by mouth daily. 45 tablet 1   Multiple Vitamins-Minerals (MULTIVITAMIN WITH MINERALS) tablet Take 1 tablet by mouth every Monday, Wednesday, and Friday.     omeprazole (PRILOSEC) 20 MG capsule Take 20 mg by mouth daily with supper.     potassium chloride SA (KLOR-CON M) 20 MEQ tablet Take 1 tablet (20 mEq total) by mouth daily with supper.     spironolactone (ALDACTONE) 25 MG tablet Take 25 mg by mouth every morning.     thiamine (VITAMIN B-1) 100 MG tablet Take 100 mg by mouth daily.     Tiotropium Bromide Monohydrate (SPIRIVA RESPIMAT) 2.5 MCG/ACT AERS Inhale 2 puffs into the lungs daily. 4 g 5   torsemide (DEMADEX) 20 MG tablet Take 20 mg by mouth 2 (two) times daily.     vitamin B-12 (CYANOCOBALAMIN) 500 MCG tablet Take 500 mcg by mouth daily with supper.     No facility-administered medications prior to visit.    Review of Systems  Constitutional:  Negative for chills, diaphoresis, fever, malaise/fatigue and weight loss.  HENT:  Negative for congestion.   Respiratory:  Positive for cough, sputum production and shortness of breath. Negative for hemoptysis and wheezing.   Cardiovascular:  Negative for chest pain, palpitations and  leg swelling.     Objective:   Vitals:   10/13/22 0940  BP: 124/60  Pulse: 65  Temp: 98.5 F (36.9 C)  TempSrc: Oral  SpO2: 94%  Weight: (!) 302 lb (137 kg)  Height: 5\' 10"  (1.778 m)   SpO2: 94 % O2 Device: None (Room air)  Body mass index is 43.33 kg/m.  Physical Exam: General: Well-appearing, no acute distress HENT: , AT Eyes: EOMI, no scleral icterus Respiratory: Diminished to auscultation bilaterally.  No crackles, wheezing or rales Cardiovascular: RRR, -M/R/G, no JVD Extremities:-Edema,-tenderness Neuro: AAO x4, CNII-XII grossly intact Psych: Normal mood, normal affect  Data Reviewed:  Imaging: CXR 06/17/22 - no infiltrate effusion or edema  PFT: None on file  Labs: CBC    Component Value Date/Time   WBC 5.5 09/15/2022 0331   RBC 3.68 (L) 09/15/2022 0331   HGB 11.6 (L) 09/15/2022 0331   HCT 34.6 (L) 09/15/2022 0331   PLT 150 09/15/2022 0331   MCV 94.0 09/15/2022 0331   MCH 31.5 09/15/2022 0331   MCHC 33.5 09/15/2022 0331   RDW 15.3 09/15/2022 0331   LYMPHSABS 1.4 09/14/2022 0940   MONOABS 0.5 09/14/2022 0940   EOSABS 0.4 09/14/2022 0940   BASOSABS 0.1 09/14/2022 0940   Absolute  06/14/22 - 500      Assessment & Plan:   Discussion: 72 year old male with COPD (no PFTs), childhood asthma, OSA on CPAP,  chronic diastolic heart failure, paroxysmal atrial fibrillation on Eliquis, HTN, HLD, hx melanoma, hx prostate cancer who presents for COPD-asthma follow-up. Last hospitalization in Feb 2024 COPD exacerbation. He prefers to avoid prolonged steroid taper unless needed. Baseline symptoms improved with current inhaler regimen but has been sedentary since his fall. Discussed clinical course and management of COPD/asthma including bronchodilator regimen and action plan for exacerbation.   COPD-asthma overlap - remains symptomatic, not in exacerbation --CONTINUE Wixela 250-50 mcg ONE puff in the morning and evening. Rinse mouth out after  use --CONTINUE Spiriva Respimat TWO puffs ONCE a day --CONTINUE Albuterol TWO puffs AS NEEDED --Encourage regular aerobic activity including walking and upper body resistance exercises for 20-30 minutes --[facility]  Hoarseness --May be inhaler related. Hold on step up for now --Will continue to monitor  OSA on CPAP --Managed by [facility] --Discussed re-arranging his home furniture so that he can use rollator safely at home --Advised against using the walls and furniture for support  Health Maintenance Immunization History  Administered Date(s) Administered   Influenza, High Dose Seasonal PF 01/29/2018   CT Lung Screen - not qualified. Quit >15 years ago  No orders of the defined types were placed in this encounter.  No orders of the defined types were placed in this encounter.   Return in about 6 months (around 04/15/2023).   I have spent a total time of 33-minutes on the day of the appointment including chart review, data review, collecting history, coordinating care and discussing medical diagnosis and plan with the patient/family. Past medical history, allergies, medications were reviewed. Pertinent imaging, labs and tests included in this note have been reviewed and interpreted independently by me.  Tauheedah Bok Mechele Collin, MD [place] [facility] 10/13/2022 10:06 AM  Office Number [phone]

## 2022-10-18 ENCOUNTER — Telehealth (HOSPITAL_COMMUNITY): Payer: Self-pay

## 2022-10-18 NOTE — Telephone Encounter (Signed)
Called pt to see if he wanted to schedule for pulmonary rehab, pt stated that he wanted to schedule pt he had a fall recently and had a head injury. [facility] wanted him [facility] have a CT scan on his head/face on 5/30. I advised pt that I will pass his information to our nurse navigator to review his information and I also adv pt to [facility] the [facility] on 5/30 visit.

## 2022-10-26 ENCOUNTER — Telehealth (HOSPITAL_COMMUNITY): Payer: Self-pay

## 2022-10-26 NOTE — Telephone Encounter (Signed)
Pt is unable to do the pulmonary rehab right now. Closed referral

## 2022-12-24 ENCOUNTER — Other Ambulatory Visit: Payer: Self-pay

## 2022-12-24 ENCOUNTER — Inpatient Hospital Stay (HOSPITAL_BASED_OUTPATIENT_CLINIC_OR_DEPARTMENT_OTHER)
Admission: EM | Admit: 2022-12-24 | Discharge: 2023-01-01 | DRG: 291 | Disposition: A | Discharge status: Home / Self Care | Payer: Medicare HMO | Attending: Internal Medicine | Admitting: Internal Medicine

## 2022-12-24 ENCOUNTER — Encounter (HOSPITAL_BASED_OUTPATIENT_CLINIC_OR_DEPARTMENT_OTHER): Payer: Self-pay | Admitting: Emergency Medicine

## 2022-12-24 ENCOUNTER — Emergency Department (HOSPITAL_BASED_OUTPATIENT_CLINIC_OR_DEPARTMENT_OTHER): Payer: Medicare HMO

## 2022-12-24 DIAGNOSIS — I4821 Permanent atrial fibrillation: Secondary | ICD-10-CM | POA: Diagnosis present

## 2022-12-24 DIAGNOSIS — D631 Anemia in chronic kidney disease: Secondary | ICD-10-CM | POA: Diagnosis present

## 2022-12-24 DIAGNOSIS — N1831 Chronic kidney disease, stage 3a: Secondary | ICD-10-CM | POA: Diagnosis present

## 2022-12-24 DIAGNOSIS — I4891 Unspecified atrial fibrillation: Secondary | ICD-10-CM

## 2022-12-24 DIAGNOSIS — Z7901 Long term (current) use of anticoagulants: Secondary | ICD-10-CM

## 2022-12-24 DIAGNOSIS — Z7984 Long term (current) use of oral hypoglycemic drugs: Secondary | ICD-10-CM

## 2022-12-24 DIAGNOSIS — E782 Mixed hyperlipidemia: Secondary | ICD-10-CM | POA: Diagnosis not present

## 2022-12-24 DIAGNOSIS — N183 Chronic kidney disease, stage 3 unspecified: Secondary | ICD-10-CM | POA: Diagnosis present

## 2022-12-24 DIAGNOSIS — Z87891 Personal history of nicotine dependence: Secondary | ICD-10-CM

## 2022-12-24 DIAGNOSIS — E119 Type 2 diabetes mellitus without complications: Secondary | ICD-10-CM | POA: Diagnosis present

## 2022-12-24 DIAGNOSIS — Z79899 Other long term (current) drug therapy: Secondary | ICD-10-CM

## 2022-12-24 DIAGNOSIS — G4733 Obstructive sleep apnea (adult) (pediatric): Secondary | ICD-10-CM | POA: Diagnosis present

## 2022-12-24 DIAGNOSIS — K219 Gastro-esophageal reflux disease without esophagitis: Secondary | ICD-10-CM | POA: Diagnosis present

## 2022-12-24 DIAGNOSIS — F411 Generalized anxiety disorder: Secondary | ICD-10-CM | POA: Diagnosis not present

## 2022-12-24 DIAGNOSIS — M109 Gout, unspecified: Secondary | ICD-10-CM | POA: Diagnosis present

## 2022-12-24 DIAGNOSIS — F4310 Post-traumatic stress disorder, unspecified: Secondary | ICD-10-CM | POA: Diagnosis present

## 2022-12-24 DIAGNOSIS — D649 Anemia, unspecified: Secondary | ICD-10-CM

## 2022-12-24 DIAGNOSIS — E871 Hypo-osmolality and hyponatremia: Secondary | ICD-10-CM | POA: Diagnosis present

## 2022-12-24 DIAGNOSIS — E875 Hyperkalemia: Secondary | ICD-10-CM | POA: Diagnosis not present

## 2022-12-24 DIAGNOSIS — M7989 Other specified soft tissue disorders: Secondary | ICD-10-CM | POA: Diagnosis present

## 2022-12-24 DIAGNOSIS — N179 Acute kidney failure, unspecified: Secondary | ICD-10-CM | POA: Diagnosis present

## 2022-12-24 DIAGNOSIS — Z806 Family history of leukemia: Secondary | ICD-10-CM

## 2022-12-24 DIAGNOSIS — G8921 Chronic pain due to trauma: Secondary | ICD-10-CM | POA: Diagnosis present

## 2022-12-24 DIAGNOSIS — N400 Enlarged prostate without lower urinary tract symptoms: Secondary | ICD-10-CM | POA: Diagnosis not present

## 2022-12-24 DIAGNOSIS — R001 Bradycardia, unspecified: Secondary | ICD-10-CM | POA: Diagnosis present

## 2022-12-24 DIAGNOSIS — I10 Essential (primary) hypertension: Secondary | ICD-10-CM | POA: Diagnosis not present

## 2022-12-24 DIAGNOSIS — Z8546 Personal history of malignant neoplasm of prostate: Secondary | ICD-10-CM

## 2022-12-24 DIAGNOSIS — E1122 Type 2 diabetes mellitus with diabetic chronic kidney disease: Secondary | ICD-10-CM | POA: Diagnosis not present

## 2022-12-24 DIAGNOSIS — Z6841 Body Mass Index (BMI) 40.0 and over, adult: Secondary | ICD-10-CM | POA: Diagnosis not present

## 2022-12-24 DIAGNOSIS — Z66 Do not resuscitate: Secondary | ICD-10-CM | POA: Diagnosis not present

## 2022-12-24 DIAGNOSIS — Z8249 Family history of ischemic heart disease and other diseases of the circulatory system: Secondary | ICD-10-CM

## 2022-12-24 DIAGNOSIS — J4489 Other specified chronic obstructive pulmonary disease: Secondary | ICD-10-CM | POA: Diagnosis not present

## 2022-12-24 DIAGNOSIS — F32A Depression, unspecified: Secondary | ICD-10-CM | POA: Diagnosis not present

## 2022-12-24 DIAGNOSIS — F1020 Alcohol dependence, uncomplicated: Secondary | ICD-10-CM | POA: Diagnosis not present

## 2022-12-24 DIAGNOSIS — E669 Obesity, unspecified: Secondary | ICD-10-CM

## 2022-12-24 DIAGNOSIS — I130 Hypertensive heart and chronic kidney disease with heart failure and stage 1 through stage 4 chronic kidney disease, or unspecified chronic kidney disease: Secondary | ICD-10-CM | POA: Diagnosis not present

## 2022-12-24 DIAGNOSIS — I509 Heart failure, unspecified: Secondary | ICD-10-CM

## 2022-12-24 DIAGNOSIS — I429 Cardiomyopathy, unspecified: Secondary | ICD-10-CM | POA: Diagnosis present

## 2022-12-24 DIAGNOSIS — D696 Thrombocytopenia, unspecified: Secondary | ICD-10-CM | POA: Diagnosis not present

## 2022-12-24 DIAGNOSIS — E1169 Type 2 diabetes mellitus with other specified complication: Secondary | ICD-10-CM | POA: Diagnosis present

## 2022-12-24 DIAGNOSIS — I4819 Other persistent atrial fibrillation: Secondary | ICD-10-CM | POA: Diagnosis present

## 2022-12-24 DIAGNOSIS — C61 Malignant neoplasm of prostate: Secondary | ICD-10-CM | POA: Diagnosis not present

## 2022-12-24 DIAGNOSIS — I5033 Acute on chronic diastolic (congestive) heart failure: Secondary | ICD-10-CM | POA: Insufficient documentation

## 2022-12-24 DIAGNOSIS — Z888 Allergy status to other drugs, medicaments and biological substances status: Secondary | ICD-10-CM

## 2022-12-24 LAB — CBC WITH DIFFERENTIAL/PLATELET
Abs Immature Granulocytes: 0.07 10*3/uL (ref 0.00–0.07)
Basophils Absolute: 0 10*3/uL (ref 0.0–0.1)
Basophils Relative: 1 %
Eosinophils Absolute: 0.5 10*3/uL (ref 0.0–0.5)
Eosinophils Relative: 9 %
HCT: 30.3 % — ABNORMAL LOW (ref 39.0–52.0)
Hemoglobin: 10.2 g/dL — ABNORMAL LOW (ref 13.0–17.0)
Immature Granulocytes: 1 %
Lymphocytes Relative: 12 %
Lymphs Abs: 0.7 10*3/uL (ref 0.7–4.0)
MCH: 31.2 pg (ref 26.0–34.0)
MCHC: 33.7 g/dL (ref 30.0–36.0)
MCV: 92.7 fL (ref 80.0–100.0)
Monocytes Absolute: 0.4 10*3/uL (ref 0.1–1.0)
Monocytes Relative: 7 %
Neutro Abs: 4.1 10*3/uL (ref 1.7–7.7)
Neutrophils Relative %: 70 %
Platelets: 140 10*3/uL — ABNORMAL LOW (ref 150–400)
RBC: 3.27 MIL/uL — ABNORMAL LOW (ref 4.22–5.81)
RDW: 15 % (ref 11.5–15.5)
WBC: 5.9 10*3/uL (ref 4.0–10.5)
nRBC: 0 % (ref 0.0–0.2)

## 2022-12-24 LAB — BASIC METABOLIC PANEL
Anion gap: 10 (ref 5–15)
Anion gap: 11 (ref 5–15)
BUN: 62 mg/dL — ABNORMAL HIGH (ref 8–23)
BUN: 64 mg/dL — ABNORMAL HIGH (ref 8–23)
CO2: 22 mmol/L (ref 22–32)
CO2: 22 mmol/L (ref 22–32)
Calcium: 10.9 mg/dL — ABNORMAL HIGH (ref 8.9–10.3)
Calcium: 10.6 mg/dL — ABNORMAL HIGH (ref 8.9–10.3)
Chloride: 96 mmol/L — ABNORMAL LOW (ref 98–111)
Chloride: 94 mmol/L — ABNORMAL LOW (ref 98–111)
Creatinine, Ser: 2.07 mg/dL — ABNORMAL HIGH (ref 0.61–1.24)
Creatinine, Ser: 1.99 mg/dL — ABNORMAL HIGH (ref 0.61–1.24)
GFR, Estimated: 33 mL/min — ABNORMAL LOW (ref >60)
GFR, Estimated: 35 mL/min — ABNORMAL LOW (ref >60)
Glucose, Bld: 115 mg/dL — ABNORMAL HIGH (ref 70–99)
Glucose, Bld: 126 mg/dL — ABNORMAL HIGH (ref 70–99)
Potassium: 5.2 mmol/L — ABNORMAL HIGH (ref 3.5–5.1)
Potassium: 5.4 mmol/L — ABNORMAL HIGH (ref 3.5–5.1)
Sodium: 128 mmol/L — ABNORMAL LOW (ref 135–145)
Sodium: 127 mmol/L — ABNORMAL LOW (ref 135–145)

## 2022-12-24 LAB — COMPREHENSIVE METABOLIC PANEL
ALT: 18 U/L (ref 0–44)
AST: 17 U/L (ref 15–41)
Albumin: 4.2 g/dL (ref 3.5–5.0)
Alkaline Phosphatase: 85 U/L (ref 38–126)
Anion gap: 9 (ref 5–15)
BUN: 69 mg/dL — ABNORMAL HIGH (ref 8–23)
CO2: 21 mmol/L — ABNORMAL LOW (ref 22–32)
Calcium: 10.7 mg/dL — ABNORMAL HIGH (ref 8.9–10.3)
Chloride: 95 mmol/L — ABNORMAL LOW (ref 98–111)
Creatinine, Ser: 2.32 mg/dL — ABNORMAL HIGH (ref 0.61–1.24)
GFR, Estimated: 29 mL/min — ABNORMAL LOW (ref >60)
Glucose, Bld: 107 mg/dL — ABNORMAL HIGH (ref 70–99)
Potassium: 5.4 mmol/L — ABNORMAL HIGH (ref 3.5–5.1)
Sodium: 125 mmol/L — ABNORMAL LOW (ref 135–145)
Total Bilirubin: 0.6 mg/dL (ref 0.3–1.2)
Total Protein: 6.3 g/dL — ABNORMAL LOW (ref 6.5–8.1)

## 2022-12-24 LAB — URINALYSIS, ROUTINE W REFLEX MICROSCOPIC
Bilirubin Urine: NEGATIVE
Glucose, UA: NEGATIVE mg/dL
Hgb urine dipstick: NEGATIVE
Ketones, ur: NEGATIVE mg/dL
Leukocytes,Ua: NEGATIVE
Nitrite: NEGATIVE
Protein, ur: NEGATIVE mg/dL
Specific Gravity, Urine: 1.007 (ref 1.005–1.030)
pH: 5.5 (ref 5.0–8.0)

## 2022-12-24 LAB — CBG MONITORING, ED: Glucose-Capillary: 124 mg/dL — ABNORMAL HIGH (ref 70–99)

## 2022-12-24 LAB — GLUCOSE, CAPILLARY
Glucose-Capillary: 121 mg/dL — ABNORMAL HIGH (ref 70–99)
Glucose-Capillary: 131 mg/dL — ABNORMAL HIGH (ref 70–99)

## 2022-12-24 LAB — MAGNESIUM: Magnesium: 2.5 mg/dL — ABNORMAL HIGH (ref 1.7–2.4)

## 2022-12-24 LAB — BRAIN NATRIURETIC PEPTIDE: B Natriuretic Peptide: 287.3 pg/mL — ABNORMAL HIGH (ref 0.0–100.0)

## 2022-12-24 LAB — MRSA NEXT GEN BY PCR, NASAL: MRSA by PCR Next Gen: DETECTED — AB

## 2022-12-24 LAB — TROPONIN I (HIGH SENSITIVITY): Troponin I (High Sensitivity): 15 ng/L (ref <18)

## 2022-12-24 MED ORDER — FUROSEMIDE 10 MG/ML IJ SOLN
40.0000 mg | Freq: Two times a day (BID) | INTRAMUSCULAR | Status: DC
  Administered 2022-12-24 – 2022-12-25 (×2): 40 mg via INTRAVENOUS
  Filled 2022-12-24 (×2): qty 4

## 2022-12-24 MED ORDER — AMLODIPINE BESYLATE 10 MG PO TABS
10.0000 mg | ORAL_TABLET | Freq: Every day | ORAL | Status: DC
  Administered 2022-12-24: 10 mg via ORAL
  Filled 2022-12-24: qty 2

## 2022-12-24 MED ORDER — MOMETASONE FURO-FORMOTEROL FUM 200-5 MCG/ACT IN AERO
2.0000 | INHALATION_SPRAY | Freq: Two times a day (BID) | RESPIRATORY_TRACT | Status: DC
  Administered 2022-12-25 – 2023-01-01 (×14): 2 via RESPIRATORY_TRACT
  Filled 2022-12-24: qty 8.8

## 2022-12-24 MED ORDER — METOPROLOL SUCCINATE ER 25 MG PO TB24: 12.5000 mg | ORAL_TABLET | Freq: Every day | ORAL | Status: DC

## 2022-12-24 MED ORDER — PANTOPRAZOLE SODIUM 40 MG PO TBEC
40.0000 mg | DELAYED_RELEASE_TABLET | Freq: Every day | ORAL | Status: DC
  Administered 2022-12-24 – 2023-01-01 (×9): 40 mg via ORAL
  Filled 2022-12-24 (×10): qty 1

## 2022-12-24 MED ORDER — SODIUM CHLORIDE 0.9% FLUSH
3.0000 mL | Freq: Two times a day (BID) | INTRAVENOUS | Status: DC
  Administered 2022-12-24 – 2022-12-30 (×13): 3 mL via INTRAVENOUS

## 2022-12-24 MED ORDER — INSULIN ASPART 100 UNIT/ML IJ SOLN
0.0000 [IU] | Freq: Three times a day (TID) | INTRAMUSCULAR | Status: DC
  Administered 2022-12-24 – 2022-12-27 (×6): 1 [IU] via SUBCUTANEOUS
  Administered 2022-12-27 – 2022-12-28 (×2): 2 [IU] via SUBCUTANEOUS
  Administered 2022-12-28 – 2022-12-29 (×3): 1 [IU] via SUBCUTANEOUS

## 2022-12-24 MED ORDER — UMECLIDINIUM BROMIDE 62.5 MCG/ACT IN AEPB
1.0000 | INHALATION_SPRAY | Freq: Every day | RESPIRATORY_TRACT | Status: DC
  Administered 2022-12-24 – 2022-12-31 (×7): 1 via RESPIRATORY_TRACT
  Filled 2022-12-24: qty 7

## 2022-12-24 MED ORDER — MUPIROCIN 2 % EX OINT
1.0000 | TOPICAL_OINTMENT | Freq: Two times a day (BID) | CUTANEOUS | Status: AC
  Administered 2022-12-24 – 2022-12-29 (×10): 1 via NASAL
  Filled 2022-12-24: qty 22

## 2022-12-24 MED ORDER — ALBUTEROL SULFATE HFA 108 (90 BASE) MCG/ACT IN AERS: 2.0000 | INHALATION_SPRAY | Freq: Four times a day (QID) | RESPIRATORY_TRACT | Status: DC | PRN

## 2022-12-24 MED ORDER — ALBUTEROL SULFATE (2.5 MG/3ML) 0.083% IN NEBU: 2.5000 mg | INHALATION_SOLUTION | Freq: Four times a day (QID) | RESPIRATORY_TRACT | Status: DC | PRN

## 2022-12-24 MED ORDER — GABAPENTIN 300 MG PO CAPS
300.0000 mg | ORAL_CAPSULE | Freq: Three times a day (TID) | ORAL | Status: DC
  Administered 2022-12-24 – 2023-01-01 (×25): 300 mg via ORAL
  Filled 2022-12-24 (×25): qty 1

## 2022-12-24 MED ORDER — ATORVASTATIN CALCIUM 40 MG PO TABS
40.0000 mg | ORAL_TABLET | Freq: Every day | ORAL | Status: DC
  Administered 2022-12-24 – 2022-12-31 (×8): 40 mg via ORAL
  Filled 2022-12-24 (×9): qty 1

## 2022-12-24 MED ORDER — CHLORHEXIDINE GLUCONATE CLOTH 2 % EX PADS
6.0000 | MEDICATED_PAD | Freq: Every day | CUTANEOUS | Status: AC
  Administered 2022-12-25 – 2022-12-29 (×5): 6 via TOPICAL

## 2022-12-24 MED ORDER — SPIRONOLACTONE 25 MG PO TABS
25.0000 mg | ORAL_TABLET | Freq: Every morning | ORAL | Status: DC
  Administered 2022-12-24 – 2022-12-25 (×2): 25 mg via ORAL
  Filled 2022-12-24: qty 2
  Filled 2022-12-24: qty 1

## 2022-12-24 MED ORDER — FUROSEMIDE 10 MG/ML IJ SOLN
40.0000 mg | Freq: Once | INTRAMUSCULAR | Status: AC
  Administered 2022-12-24: 40 mg via INTRAVENOUS
  Filled 2022-12-24: qty 4

## 2022-12-24 MED ORDER — APIXABAN 5 MG PO TABS
5.0000 mg | ORAL_TABLET | Freq: Two times a day (BID) | ORAL | Status: DC
  Administered 2022-12-24 – 2023-01-01 (×17): 5 mg via ORAL
  Filled 2022-12-24: qty 2
  Filled 2022-12-24 (×16): qty 1

## 2022-12-24 MED ORDER — EMPAGLIFLOZIN 10 MG PO TABS: 10.0000 mg | ORAL_TABLET | Freq: Two times a day (BID) | ORAL | Status: DC

## 2022-12-24 MED ORDER — TIOTROPIUM BROMIDE MONOHYDRATE 2.5 MCG/ACT IN AERS: 2.0000 | INHALATION_SPRAY | Freq: Every day | RESPIRATORY_TRACT | Status: DC

## 2022-12-24 MED ORDER — ACETAMINOPHEN 325 MG PO TABS: 650.0000 mg | ORAL_TABLET | Freq: Four times a day (QID) | ORAL | Status: DC | PRN

## 2022-12-24 MED ORDER — ACETAMINOPHEN 325 MG PO TABS
650.0000 mg | ORAL_TABLET | Freq: Four times a day (QID) | ORAL | Status: DC | PRN
  Administered 2022-12-24 – 2022-12-31 (×5): 650 mg via ORAL
  Filled 2022-12-24 (×5): qty 2

## 2022-12-24 MED ORDER — ALPRAZOLAM 0.5 MG PO TABS
1.0000 mg | ORAL_TABLET | Freq: Every day | ORAL | Status: DC
  Administered 2022-12-24 – 2022-12-31 (×8): 1 mg via ORAL
  Filled 2022-12-24 (×8): qty 2

## 2022-12-24 MED ORDER — ACETAMINOPHEN 650 MG RE SUPP: 650.0000 mg | Freq: Four times a day (QID) | RECTAL | Status: DC | PRN

## 2022-12-24 MED ORDER — POLYETHYLENE GLYCOL 3350 17 G PO PACK: 17.0000 g | PACK | Freq: Every day | ORAL | Status: DC | PRN

## 2022-12-24 NOTE — Progress Notes (Signed)
Bruise left back under scapula, bilateral upper arm bruising, bilateral groin redness, pannus redness, redness bilateral lower extremities, scabs bilateral lower extremities. Bilateral weeping to lower extremeties 3-4 swelling in BLEs skin check with Porfirio Oar, RN

## 2022-12-24 NOTE — H&P (Signed)
History and Physical   PACO CISLO QQV:[id number] DOB: [birth year] DOA: 12/24/2022  PCP: Benjiman Core, MD   Patient coming from: Home  Chief Complaint: Worsening edema and shortness of breath  HPI: Francis Cardenas is a 72 y.o. male with medical history significant of hypertension, hyperlipidemia, GERD, obesity, OSA, atrial fibrillation, prostate cancer, diabetes, COPD, CKD 3, CHF, alcohol use, PTSD, depression, anxiety, gout, chronic pain presenting with worsening edema and shortness of breath.  Patient reports around 2 months of gradually worsening lower extremity edema and 3 weeks of worsening shortness of breath.  Last night he awoke with shortness of breath, unable to get back to sleep.  Also reports some fluid weeping from his lower extremities.  Denies fevers, chills, chest pain, abdominal pain, constipation, diarrhea, nausea, vomiting.  ED Course: Vital signs in the ED notable for blood pressure in the 110s to 150s systolic, heart rate in the 40s to 60s.  Lab workup included CMP with sodium 125, chloride 95, potassium 5.4, bicarb 21, normal gap, BUN 69, creatinine elevated 2.32 from baseline 1.6, calcium 10.7, protein 6.3.  CBC with hemoglobin of 10.2 which is stable.  Troponin negative.  BNP elevated to 287.  Urinalysis without acute abnormality.  Chest x-ray showed cardiomegaly and symmetric haziness consistent with pulmonary edema.  Patient received home medications and a total of 80 mg IV Lasix in the ED.  Review of Systems: As per HPI otherwise all other systems reviewed and are negative.  Past Medical History:  Diagnosis Date   Alcohol dependence (HCC)    BPH (benign prostatic hyperplasia)    Cancer (HCC)    prostate   COPD (chronic obstructive pulmonary disease) (HCC)    Diabetes mellitus without complication (HCC)    Dyspnea on exertion 07/14/2022   Elevated troponin 04/06/2019   Fall at home, initial encounter 09/14/2022   Hypercholesteremia    Hypertension     OSA on CPAP     Past Surgical History:  Procedure Laterality Date   ACHILLES TENDON REPAIR Left    KNEE ARTHROSCOPY Left    RIGHT/LEFT HEART CATH AND CORONARY ANGIOGRAPHY N/A 04/09/2019   Procedure: RIGHT/LEFT HEART CATH AND CORONARY ANGIOGRAPHY;  Surgeon: Swaziland, Peter M, MD;  Location: [facility] INVASIVE CV LAB;  Service: Cardiovascular;  Laterality: N/A;   TONSILLECTOMY     WRIST FRACTURE SURGERY Left     Social History  reports that he quit smoking about 44 years ago. His smoking use included cigarettes. He started smoking about 57 years ago. He has a 13 pack-year smoking history. He has never been exposed to tobacco smoke. He has never used smokeless tobacco. He reports current alcohol use of about 8.0 standard drinks of alcohol per week. He reports that he does not use drugs.  Allergies  Allergen Reactions   Oxcarbazepine Other (See Comments)    Dizziness/ lightheaded   Zolpidem Nausea Only    Family History  Problem Relation Age of Onset   CAD Mother    Leukemia Mother    Heart attack Father    Heart attack Brother   Reviewed on admission  Prior to Admission medications   Medication Sig Start Date End Date Taking? Authorizing Provider  allopurinol (ZYLOPRIM) 100 MG tablet Take 100 mg by mouth daily. 05/07/22  Yes [provider]  allopurinol (ZYLOPRIM) 300 MG tablet Take 1 tablet by mouth daily. 10/12/21  Yes [provider]  amLODipine (NORVASC) 10 MG tablet Take 10 mg by mouth daily. 10/21/21  Yes [provider]  apixaban (ELIQUIS) 5 MG TABS tablet Take 1 tablet (5 mg total) by mouth 2 (two) times daily. 02/28/22  Yes Jodelle Red, MD  atorvastatin (LIPITOR) 80 MG tablet Take 40 mg by mouth daily with supper.   Yes [provider]  empagliflozin (JARDIANCE) 25 MG TABS tablet Take 12.5 mg by mouth 2 (two) times daily.   Yes [provider]  gabapentin (NEURONTIN) 300 MG capsule Take 300 mg by mouth 3 (three) times daily.    Yes [provider]  hydrALAZINE (APRESOLINE) 25 MG tablet Take 25 mg by mouth daily. 10/21/21  Yes [provider]  Magnesium Oxide 420 MG TABS Take 420 mg by mouth daily with supper.   Yes [provider]  metoprolol succinate (TOPROL-XL) 25 MG 24 hr tablet Take 0.5 tablets (12.5 mg total) by mouth daily. 09/04/21  Yes Lonia Blood, MD  Multiple Vitamins-Minerals (MULTIVITAMIN WITH MINERALS) tablet Take 1 tablet by mouth every Monday, Wednesday, and Friday.   Yes [provider]  omeprazole (PRILOSEC) 20 MG capsule Take 20 mg by mouth daily with supper.   Yes [provider]  potassium chloride SA (KLOR-CON M) 20 MEQ tablet Take 1 tablet (20 mEq total) by mouth daily with supper. 05/20/21  Yes Dahal, Melina Schools, MD  spironolactone (ALDACTONE) 25 MG tablet Take 25 mg by mouth every morning.   Yes [provider]  thiamine (VITAMIN B-1) 100 MG tablet Take 100 mg by mouth daily.   Yes [provider]  torsemide (DEMADEX) 20 MG tablet Take 20 mg by mouth 2 (two) times daily. 10/21/21  Yes [provider]  acetaminophen (TYLENOL) 325 MG tablet Take 2 tablets (650 mg total) by mouth every 6 (six) hours as needed. 09/27/21   Sloan Leiter, DO  albuterol (PROVENTIL HFA;VENTOLIN HFA) 108 (90 BASE) MCG/ACT inhaler Inhale 2 puffs into the lungs every 6 (six) hours as needed for wheezing.    [provider]  ALPRAZolam Prudy Feeler) 1 MG tablet Take 1 mg by mouth at bedtime. 05/24/22   [provider]  fluticasone-salmeterol (WIXELA INHUB) 250-50 MCG/ACT AEPB Inhale 1 puff into the lungs in the morning and at bedtime. 09/05/22   Luciano Cutter, MD  Tiotropium Bromide Monohydrate (SPIRIVA RESPIMAT) 2.5 MCG/ACT AERS Inhale 2 puffs into the lungs daily. 09/05/22   Luciano Cutter, MD  vitamin B-12 (CYANOCOBALAMIN) 500 MCG tablet Take 500 mcg by mouth daily with supper.    [provider]    Physical Exam: Vitals:    12/24/22 1034 12/24/22 1038 12/24/22 1100 12/24/22 1301  BP: (!) 150/55  (!) 141/52   Pulse:  (!) 54 (!) 55   Resp: 11 16 18    Temp:      TempSrc:      SpO2:  98% 97%   Weight:    (!) 138.3 kg  Height:    5\' 10"  (1.778 m)    Physical Exam Constitutional:      General: He is not in acute distress.    Appearance: Normal appearance. He is obese.  HENT:     Head: Normocephalic and atraumatic.     Mouth/Throat:     Mouth: Mucous membranes are moist.     Pharynx: Oropharynx is clear.  Eyes:     Extraocular Movements: Extraocular movements intact.     Pupils: Pupils are equal, round, and reactive to light.  Cardiovascular:     Rate and Rhythm: Normal rate and regular rhythm.  Pulses: Normal pulses.     Heart sounds: Normal heart sounds.  Pulmonary:     Effort: Pulmonary effort is normal. No respiratory distress.     Breath sounds: Rales present.  Abdominal:     General: Bowel sounds are normal. There is no distension.     Palpations: Abdomen is soft.     Tenderness: There is no abdominal tenderness.  Musculoskeletal:        General: No swelling or deformity.     Right lower leg: Edema present.     Left lower leg: Edema present.  Skin:    General: Skin is warm and dry.  Neurological:     General: No focal deficit present.     Mental Status: Mental status is at baseline.    Labs on Admission: I have personally reviewed following labs and imaging studies  CBC: Recent Labs  Lab 12/24/22 0414  WBC 5.9  NEUTROABS 4.1  HGB 10.2*  HCT 30.3*  MCV 92.7  PLT 140*    Basic Metabolic Panel: Recent Labs  Lab 12/24/22 0414  NA 125*  K 5.4*  CL 95*  CO2 21*  GLUCOSE 107*  BUN 69*  CREATININE 2.32*  CALCIUM 10.7*    GFR: Estimated Creatinine Clearance: 39.7 mL/min (A) (by C-G formula based on SCr of 2.32 mg/dL (H)).  Liver Function Tests: Recent Labs  Lab 12/24/22 0414  AST 17  ALT 18  ALKPHOS 85  BILITOT 0.6  PROT 6.3*  ALBUMIN 4.2    Urine  analysis:    Component Value Date/Time   COLORURINE COLORLESS (A) 12/24/2022 0628   APPEARANCEUR CLEAR 12/24/2022 0628   LABSPEC 1.007 12/24/2022 0628   PHURINE 5.5 12/24/2022 0628   GLUCOSEU NEGATIVE 12/24/2022 0628   HGBUR NEGATIVE 12/24/2022 0628   BILIRUBINUR NEGATIVE 12/24/2022 0628   KETONESUR NEGATIVE 12/24/2022 0628   PROTEINUR NEGATIVE 12/24/2022 0628   UROBILINOGEN 0.2 03/24/2013 1255   NITRITE NEGATIVE 12/24/2022 0628   LEUKOCYTESUR NEGATIVE 12/24/2022 0628    Radiological Exams on Admission: DG Chest Port 1 View  Result Date: 12/24/2022 CLINICAL DATA:  Shortness of breath, edema. Leg swelling for 2 months with orthopnea. EXAM: PORTABLE CHEST 1 VIEW COMPARISON:  10/11/2022 FINDINGS: Hazy density at the bases. Chronic cardiomegaly. There could be a hiatal hernia with gas bubble projecting over the left lung base. Stable central vessels. The left lateral costophrenic sulcus is excluded from view. No visible pleural fluid. No pneumothorax. IMPRESSION: Symmetric hazy density at the bases, history suggesting edema. Stable cardiomegaly. Electronically Signed   By: Tiburcio Pea M.D.   On: 12/24/2022 05:01    EKG: Independently reviewed.  Atrial fibrillation at 59 bpm.  Nonspecific intraventricular conduction delay with QRS of 114 and possible early left bundle branch block morphology.  Nonspecific T wave changes.  Similar to EKG on 10/11/2022.  Assessment/Plan Active Problems:   Depressive disorder   Alcohol dependence (HCC)   PTSD (post-traumatic stress disorder)   Benign essential hypertension   COPD with asthma   GERD (gastroesophageal reflux disease)   Mixed hyperlipidemia   Type 2 diabetes mellitus with obesity (HCC)   OSA (obstructive sleep apnea)   Morbid obesity (HCC)   Prostate cancer (HCC)   Persistent atrial fibrillation (HCC)   Acute on chronic diastolic CHF (congestive heart failure) (HCC)   Chronic kidney disease, stage 3 (HCC)   Anxiety state   Chronic  pain due to trauma   Acute on chronic diastolic CHF > As per HPI patient  reported 2 months of worsening edema and 3 weeks of worsening shortness of breath. > Last echo was in 2023 with EF 60-65%, indeterminate diastolic function, normal RV function.  Significant right atrial dilation also noted. > Noted to have BNP of 287 in ED which is likely falsely low considering BMI of 43.  Additionally AKI as below and hypervolemic hyponatremia. > Chest x-ray showed cardiomegaly and symmetric haziness suggestive of pulmonary edema. > Patient received 80 mg IV Lasix in the ED. - Monitor on telemetry - Continue with Lasix 40 mg IV twice daily - Strict I's and O's, daily weights - Check magnesium - Trend renal function and electrolytes - Echocardiogram - Continue home carved, spironolactone ilol - Jardiance  Hyponatremia Hyperkalemia AKI on CKD 3 > Noted to have sodium of 125 in the setting of hypervolemia.  Potassium 5.4.  Creatinine elevated 2.32 from baseline 1.6. > As above hypervolemic hyponatremia, AKI likely from setting of hypervolemia/CHF exacerbation as well. - Continue with diuresis as above - Trend renal function and electrolytes - Hold Jardiance  Hypertension - Holding home amlodipine, hydralazine for now - Continue home spironolactone - Hold carvedilol - Diuresis as above no  Hyperlipidemia - Continue home atorvastatin  GERD - Continue home PPI  Obesity - Noted  OSA - Continue home CPAP  Atrial fibrillation > Currently with SVR.   -  Hold carvedilol - Continue Eliquis  History of prostate cancer - Noted  Diabetes - SSI  COPD - Replace home Wixela with formulary Dulera - Continue home future. - Continue.  Albuterol  PTSD Depression Anxiety - Continue home Xanax  Gout - Holding home allopurinol  DVT prophylaxis: Eliquis Code Status:   Full Family Communication:  None on admission  Disposition Plan:   Patient is from:  Home  Anticipated DC  to:  Home  Anticipated DC date:  1 to 4 days  Anticipated DC barriers: None  Consults called:  None Admission status:  Observation, telemetry  Severity of Illness: The appropriate patient status for this patient is OBSERVATION. Observation status is judged to be reasonable and necessary in order to provide the required intensity of service to ensure the patient's safety. The patient's presenting symptoms, physical exam findings, and initial radiographic and laboratory data in the context of their medical condition is felt to place them at decreased risk for further clinical deterioration. Furthermore, it is anticipated that the patient will be medically stable for discharge from the hospital within 2 midnights of admission.    Synetta Fail MD [facility]  How to contact the [facility] Attending or Consulting provider 7A - 7P or covering provider during after hours 7P -7A, for this patient?   Check the care team in [facility] and look for a) attending/consulting [facility] provider listed and b) the [facility] team listed Log into [email or web address] and use [place]'s universal password to access. If you do not have the password, please contact the hospital operator. Locate the [facility] provider you are looking for under [facility] and page to a number that you can be directly reached. If you still have difficulty reaching the provider, please page the [facility] (Director on Call) for the Hospitalists listed on amion for assistance.  12/24/2022, 1:44 PM

## 2022-12-24 NOTE — ED Notes (Signed)
Pt given frozen chicken/mash potato meal, vegetable soup and diet gingerale. Cbg 124.

## 2022-12-24 NOTE — ED Triage Notes (Signed)
Pt presents from home for leg swelling x 2 month, worse over the past 2 weeks and now weeping clear fluid.   Takes 20 mg torsemide daily.  Endorses dyspnea with exertion, orthopnea (sleeps in recliner), nonproductive cough, chest pressure Denies fever

## 2022-12-24 NOTE — ED Notes (Signed)
Pt able to void 600 cc clear yellow urine while standing. Pt tried earlier sitting down and was unable.

## 2022-12-24 NOTE — Progress Notes (Signed)
   12/24/22 2342  BiPAP/CPAP/SIPAP  $ Non-Invasive Ventilator  Non-Invasive Vent Set Up;Non-Invasive Vent Initial  $ Face Mask Large  Yes  BiPAP/CPAP/SIPAP Pt Type Adult  BiPAP/CPAP/SIPAP Resmed  Mask Type Full face mask  Mask Size Large  Respiratory Rate 18 breaths/min  EPAP 14 cmH2O  FiO2 (%) 21 %  Flow Rate 0 lpm  Patient Home Equipment No  Auto Titrate No  CPAP/SIPAP surface wiped down Yes  BiPAP/CPAP /SiPAP Vitals  Bilateral Breath Sounds Diminished

## 2022-12-24 NOTE — Progress Notes (Signed)
Plan of Care Note for accepted transfer   Patient: Francis Cardenas MRN: [id number]   DOA: 12/24/2022  Facility requesting transfer: [facility] Requesting Provider: Dr. Preston Fleeting Reason for transfer: Acute on chronic CHF, AKI, hyponatremia and other lab abnormalities Facility course: 72 year old male with history of alcohol dependence, paroxysmal A-fib on Eliquis, chronic HFpEF, COPD, diabetes, hypertension, hyperlipidemia, OSA on CPAP, class III obesity (BMI 44.74), prostate cancer presented to the ED with complaints of dyspnea on exertion, orthopnea, cough, and bilateral lower extremity edema.  Bradycardic with heart rate in the 40s to 50s, not hypotensive.  Not hypoxic.  Labs showing no leukocytosis, hemoglobin 10.2, platelet count 140k, sodium 125, potassium 5.4, chloride 95, bicarb 21, BUN 69, creatinine 2.3 (baseline 1.3-1.5), glucose 107, calcium 10.7, albumin 4.2, troponin negative, BNP 287.  Chest x-ray showing pulmonary edema. Patient was given IV Lasix 40 mg x 2.  Plan of care: The patient is accepted for admission to Telemetry unit, at [facility]..   [facility] will assume care on arrival to accepting facility. Until arrival, care as per EDP. However, [facility] available 24/7 for questions and assistance.  Author: Lamoine Giovanni, MD 12/24/2022  Check [email or web address] for on-call coverage.  Nursing staff, Please call [facility] Admits & Consults System-Wide number on Amion as soon as patient's arrival, so appropriate admitting provider can evaluate the pt.

## 2022-12-24 NOTE — ED Provider Notes (Signed)
[place] EMERGENCY DEPARTMENT AT [facility] Provider Note   CSN: [id number] Arrival date & time: 12/24/22  4098     History  Chief Complaint  Patient presents with   Leg Swelling    Francis Cardenas is a 72 y.o. male.  The history is provided by the patient.  He has history of hypertension, hyperlipidemia, COPD, sleep apnea, paroxysmal atrial fibrillation anticoagulated on apixaban, diastolic heart failure comes in because of increased leg swelling and shortness of breath.  He states that his legs have been swelling for the last 2 months, and shortness of breath has been occurring for the last several weeks.  Night before last, he woke up and was unable to get back to sleep.  He denies chest pain, heaviness, tightness, pressure.  He states he has been compliant with all of his medications.  He has noted some fluid draining from his legs.   Home Medications Prior to Admission medications   Medication Sig Start Date End Date Taking? Authorizing Provider  acetaminophen (TYLENOL) 325 MG tablet Take 2 tablets (650 mg total) by mouth every 6 (six) hours as needed. 09/27/21   Sloan Leiter, DO  albuterol (PROVENTIL HFA;VENTOLIN HFA) 108 (90 BASE) MCG/ACT inhaler Inhale 2 puffs into the lungs every 6 (six) hours as needed for wheezing.    [provider]  allopurinol (ZYLOPRIM) 100 MG tablet Take 100 mg by mouth daily. 05/07/22   [provider]  allopurinol (ZYLOPRIM) 300 MG tablet Take 1 tablet by mouth daily. 10/12/21   [provider]  ALPRAZolam Prudy Feeler) 1 MG tablet Take 1 mg by mouth at bedtime. 05/24/22   [provider]  amLODipine (NORVASC) 10 MG tablet Take 10 mg by mouth daily. 10/21/21   [provider]  apixaban (ELIQUIS) 5 MG TABS tablet Take 1 tablet (5 mg total) by mouth 2 (two) times daily. 02/28/22   Jodelle Red, MD  atorvastatin (LIPITOR) 80 MG tablet Take 40 mg by mouth daily with supper.    [provider]   empagliflozin (JARDIANCE) 25 MG TABS tablet Take 12.5 mg by mouth 2 (two) times daily.    [provider]  fluticasone-salmeterol (WIXELA INHUB) 250-50 MCG/ACT AEPB Inhale 1 puff into the lungs in the morning and at bedtime. 09/05/22   Luciano Cutter, MD  gabapentin (NEURONTIN) 300 MG capsule Take 300 mg by mouth 3 (three) times daily.    [provider]  hydrALAZINE (APRESOLINE) 25 MG tablet Take 25 mg by mouth daily. 10/21/21   [provider]  Magnesium Oxide 420 MG TABS Take 420 mg by mouth daily with supper.    [provider]  metoprolol succinate (TOPROL-XL) 25 MG 24 hr tablet Take 0.5 tablets (12.5 mg total) by mouth daily. 09/04/21   Lonia Blood, MD  Multiple Vitamins-Minerals (MULTIVITAMIN WITH MINERALS) tablet Take 1 tablet by mouth every Monday, Wednesday, and Friday.    [provider]  omeprazole (PRILOSEC) 20 MG capsule Take 20 mg by mouth daily with supper.    [provider]  potassium chloride SA (KLOR-CON M) 20 MEQ tablet Take 1 tablet (20 mEq total) by mouth daily with supper. 05/20/21   Lorin Glass, MD  spironolactone (ALDACTONE) 25 MG tablet Take 25 mg by mouth every morning.    [provider]  thiamine (VITAMIN B-1) 100 MG tablet Take 100 mg by mouth daily.    [provider]  Tiotropium Bromide Monohydrate (SPIRIVA RESPIMAT) 2.5 MCG/ACT AERS Inhale  2 puffs into the lungs daily. 09/05/22   Luciano Cutter, MD  torsemide (DEMADEX) 20 MG tablet Take 20 mg by mouth 2 (two) times daily. 10/21/21   [provider]  vitamin B-12 (CYANOCOBALAMIN) 500 MCG tablet Take 500 mcg by mouth daily with supper.    [provider]      Allergies    Oxcarbazepine and Zolpidem    Review of Systems   Review of Systems  All other systems reviewed and are negative.   Physical Exam Updated Vital Signs There were no vitals taken for this visit. Physical Exam Vitals and nursing note reviewed.    72 year old male, resting comfortably and in no acute distress. Vital signs are significant for borderline low heart rate and elevated respiratory rate. Oxygen saturation is 97%, which is normal. Head is normocephalic and atraumatic. PERRLA, EOMI. Oropharynx is clear. Neck is nontender and supple without adenopathy or JVD. Back is nontender and there is no CVA tenderness. Lungs have very distant breath sounds without rales, wheezes, or rhonchi. Chest is nontender. Heart has regular rate and rhythm without murmur. Abdomen is soft, flat, nontender. Extremities have 3-4+ edema with some fluid leaking from the posterior lower legs, full range of motion is present. Skin is warm and dry without rash. Neurologic: Mental status is normal, cranial nerves are intact, moves all extremities equally.  ED Results / Procedures / Treatments   Labs (all labs ordered are listed, but only abnormal results are displayed) Labs Reviewed  COMPREHENSIVE METABOLIC PANEL - Abnormal; Notable for the following components:      Result Value   Sodium 125 (*)    Potassium 5.4 (*)    Chloride 95 (*)    CO2 21 (*)    Glucose, Bld 107 (*)    BUN 69 (*)    Creatinine, Ser 2.32 (*)    Calcium 10.7 (*)    Total Protein 6.3 (*)    GFR, Estimated 29 (*)    All other components within normal limits  CBC WITH DIFFERENTIAL/PLATELET - Abnormal; Notable for the following components:   RBC 3.27 (*)    Hemoglobin 10.2 (*)    HCT 30.3 (*)    Platelets 140 (*)    All other components within normal limits  BRAIN NATRIURETIC PEPTIDE - Abnormal; Notable for the following components:   B Natriuretic Peptide 287.3 (*)    All other components within normal limits  URINALYSIS, ROUTINE W REFLEX MICROSCOPIC - Abnormal; Notable for the following components:   Color, Urine COLORLESS (*)    All other components within normal limits  TROPONIN I (HIGH SENSITIVITY)    EKG EKG Interpretation Date/Time:  Saturday December 24 2022  04:13:07 EDT Ventricular Rate:  59 PR Interval:    QRS Duration:  114 QT Interval:  419 QTC Calculation: 415 R Axis:   93  Text Interpretation: Atrial fibrillation Borderline intraventricular conduction delay Low voltage, extremity and precordial leads When compared with ECG of 10/11/2022, No significant change was found Confirmed by Dione Booze ([id number]) on 12/24/2022 4:15:10 AM  Radiology DG Chest Port 1 View  Result Date: 12/24/2022 CLINICAL DATA:  Shortness of breath, edema. Leg swelling for 2 months with orthopnea. EXAM: PORTABLE CHEST 1 VIEW COMPARISON:  10/11/2022 FINDINGS: Hazy density at the bases. Chronic cardiomegaly. There could be a hiatal hernia with gas bubble projecting over the left lung base. Stable central vessels. The left lateral costophrenic sulcus is excluded from view. No visible pleural fluid. No  pneumothorax. IMPRESSION: Symmetric hazy density at the bases, history suggesting edema. Stable cardiomegaly. Electronically Signed   By: Tiburcio Pea M.D.   On: 12/24/2022 05:01    Procedures Procedures  Cardiac monitor shows atrial fibrillation, per my interpretation.  Medications Ordered in ED Medications  furosemide (LASIX) injection 40 mg (40 mg Intravenous Given 12/24/22 0438)  furosemide (LASIX) injection 40 mg (40 mg Intravenous Given 12/24/22 0540)    ED Course/ Medical Decision Making/ A&P                                 Medical Decision Making Amount and/or Complexity of Data Reviewed Labs: ordered. Radiology: ordered.  Risk Prescription drug management. Decision regarding hospitalization.   Progressive leg swelling and dyspnea on exertion which seems to be exacerbation of known diastolic heart failure.  I reviewed and interpreted his electrocardiogram and my interpretation is atrial fibrillation with low voltage and unchanged from prior.  I have reviewed his past records, and echocardiogram on 08/31/2021 showed left ventricular ejection fraction 60-65%,  but indeterminate diastolic parameters because of underlying atrial fibrillation.  On 04/09/2019, he had cardiac catheterization which showed normal coronary artery anatomy, doubt ACS in that setting.  I have reviewed his past records, and he was admitted 08/30/2021-09/02/2021 for heart failure exacerbation, also 12/20 07-2020-05/21/2021 and 04/06/2019-04/10/2019.  I have ordered a dose of furosemide and I have also ordered workup to include comprehensive metabolic panel, troponin, CBC, BNP.  Chest x-ray is suggestive of pulmonary edema.  I have independently viewed the image, and agree with the radiologist's interpretation.  I have reviewed and interpreted his laboratory test, my interpretation is moderate hyponatremia which is stable, borderline hyperkalemia which is not felt to be clinically significant, elevated random glucose, BUN and creatinine significantly increased over baseline, borderline hypercalcemia, normal troponin, mild to moderate anemia which is worse compared with 09/15/2022, normal troponin, BNP moderately elevated.  He did not have significant diuresis from initial dose of furosemide and I have ordered a second dose of furosemide.  Because of the combination of worsening heart failure and worsening renal function, he will need to be admitted.  I discussed the case with Dr. Loney Loh of [facility], who agrees to admit the patient.  Final Clinical Impression(s) / ED Diagnoses Final diagnoses:  Acute on chronic diastolic heart failure (HCC)  Acute kidney injury (nontraumatic) (HCC)  Atrial fibrillation, unspecified type (HCC)  Chronic anticoagulation  Hyponatremia  Normochromic normocytic anemia  Thrombocytopenia (HCC)    Rx / DC Orders ED Discharge Orders     None         Dione Booze, MD 12/24/22 [phone]

## 2022-12-24 NOTE — ED Notes (Signed)
Kim with cl called for transport

## 2022-12-25 ENCOUNTER — Observation Stay (HOSPITAL_COMMUNITY): Payer: Medicare HMO

## 2022-12-25 DIAGNOSIS — F32A Depression, unspecified: Secondary | ICD-10-CM | POA: Diagnosis present

## 2022-12-25 DIAGNOSIS — I4821 Permanent atrial fibrillation: Secondary | ICD-10-CM | POA: Diagnosis present

## 2022-12-25 DIAGNOSIS — D631 Anemia in chronic kidney disease: Secondary | ICD-10-CM | POA: Diagnosis present

## 2022-12-25 DIAGNOSIS — D696 Thrombocytopenia, unspecified: Secondary | ICD-10-CM | POA: Diagnosis present

## 2022-12-25 DIAGNOSIS — N400 Enlarged prostate without lower urinary tract symptoms: Secondary | ICD-10-CM | POA: Diagnosis present

## 2022-12-25 DIAGNOSIS — F411 Generalized anxiety disorder: Secondary | ICD-10-CM | POA: Diagnosis present

## 2022-12-25 DIAGNOSIS — I5033 Acute on chronic diastolic (congestive) heart failure: Secondary | ICD-10-CM

## 2022-12-25 DIAGNOSIS — E1122 Type 2 diabetes mellitus with diabetic chronic kidney disease: Secondary | ICD-10-CM | POA: Diagnosis present

## 2022-12-25 DIAGNOSIS — Z6841 Body Mass Index (BMI) 40.0 and over, adult: Secondary | ICD-10-CM | POA: Diagnosis not present

## 2022-12-25 DIAGNOSIS — I5031 Acute diastolic (congestive) heart failure: Secondary | ICD-10-CM

## 2022-12-25 DIAGNOSIS — E875 Hyperkalemia: Secondary | ICD-10-CM | POA: Diagnosis present

## 2022-12-25 DIAGNOSIS — Z66 Do not resuscitate: Secondary | ICD-10-CM | POA: Diagnosis present

## 2022-12-25 DIAGNOSIS — I429 Cardiomyopathy, unspecified: Secondary | ICD-10-CM | POA: Diagnosis present

## 2022-12-25 DIAGNOSIS — M7989 Other specified soft tissue disorders: Secondary | ICD-10-CM | POA: Diagnosis present

## 2022-12-25 DIAGNOSIS — J4489 Other specified chronic obstructive pulmonary disease: Secondary | ICD-10-CM | POA: Diagnosis present

## 2022-12-25 DIAGNOSIS — F4310 Post-traumatic stress disorder, unspecified: Secondary | ICD-10-CM | POA: Diagnosis present

## 2022-12-25 DIAGNOSIS — I130 Hypertensive heart and chronic kidney disease with heart failure and stage 1 through stage 4 chronic kidney disease, or unspecified chronic kidney disease: Secondary | ICD-10-CM | POA: Diagnosis present

## 2022-12-25 DIAGNOSIS — N179 Acute kidney failure, unspecified: Secondary | ICD-10-CM | POA: Diagnosis present

## 2022-12-25 DIAGNOSIS — E782 Mixed hyperlipidemia: Secondary | ICD-10-CM | POA: Diagnosis present

## 2022-12-25 DIAGNOSIS — N1831 Chronic kidney disease, stage 3a: Secondary | ICD-10-CM | POA: Diagnosis present

## 2022-12-25 DIAGNOSIS — M109 Gout, unspecified: Secondary | ICD-10-CM | POA: Diagnosis present

## 2022-12-25 DIAGNOSIS — I509 Heart failure, unspecified: Secondary | ICD-10-CM

## 2022-12-25 DIAGNOSIS — C61 Malignant neoplasm of prostate: Secondary | ICD-10-CM | POA: Diagnosis present

## 2022-12-25 DIAGNOSIS — Z87891 Personal history of nicotine dependence: Secondary | ICD-10-CM | POA: Diagnosis not present

## 2022-12-25 DIAGNOSIS — E871 Hypo-osmolality and hyponatremia: Secondary | ICD-10-CM | POA: Diagnosis present

## 2022-12-25 DIAGNOSIS — F1020 Alcohol dependence, uncomplicated: Secondary | ICD-10-CM | POA: Diagnosis present

## 2022-12-25 LAB — ECHOCARDIOGRAM COMPLETE
AR max vel: 3.49 cm2
AV Area VTI: 3.61 cm2
AV Area mean vel: 3.4 cm2
AV Mean grad: 5 mmHg
AV Peak grad: 9.4 mmHg
Ao pk vel: 1.54 m/s
Height: 70 in
S' Lateral: 2.8 cm
Weight: 4867.2 [oz_av]

## 2022-12-25 LAB — BASIC METABOLIC PANEL WITH GFR
Anion gap: 12 (ref 5–15)
BUN: 64 mg/dL — ABNORMAL HIGH (ref 8–23)
CO2: 21 mmol/L — ABNORMAL LOW (ref 22–32)
Calcium: 10.8 mg/dL — ABNORMAL HIGH (ref 8.9–10.3)
Chloride: 94 mmol/L — ABNORMAL LOW (ref 98–111)
Creatinine, Ser: 1.98 mg/dL — ABNORMAL HIGH (ref 0.61–1.24)
GFR, Estimated: 35 mL/min — ABNORMAL LOW (ref >60)
Glucose, Bld: 116 mg/dL — ABNORMAL HIGH (ref 70–99)
Potassium: 4.9 mmol/L (ref 3.5–5.1)
Sodium: 127 mmol/L — ABNORMAL LOW (ref 135–145)

## 2022-12-25 LAB — CBC
HCT: 33.3 % — ABNORMAL LOW (ref 39.0–52.0)
Hemoglobin: 11.1 g/dL — ABNORMAL LOW (ref 13.0–17.0)
MCH: 31.3 pg (ref 26.0–34.0)
MCHC: 33.3 g/dL (ref 30.0–36.0)
MCV: 93.8 fL (ref 80.0–100.0)
Platelets: 159 10*3/uL (ref 150–400)
RBC: 3.55 MIL/uL — ABNORMAL LOW (ref 4.22–5.81)
RDW: 15.3 % (ref 11.5–15.5)
WBC: 6.6 10*3/uL (ref 4.0–10.5)
nRBC: 0 % (ref 0.0–0.2)

## 2022-12-25 LAB — GLUCOSE, CAPILLARY
Glucose-Capillary: 117 mg/dL — ABNORMAL HIGH (ref 70–99)
Glucose-Capillary: 117 mg/dL — ABNORMAL HIGH (ref 70–99)
Glucose-Capillary: 135 mg/dL — ABNORMAL HIGH (ref 70–99)
Glucose-Capillary: 119 mg/dL — ABNORMAL HIGH (ref 70–99)

## 2022-12-25 MED ORDER — PERFLUTREN LIPID MICROSPHERE
1.0000 mL | INTRAVENOUS | Status: AC | PRN
  Administered 2022-12-25: 3 mL via INTRAVENOUS

## 2022-12-25 MED ORDER — ADULT MULTIVITAMIN W/MINERALS CH
1.0000 | ORAL_TABLET | Freq: Every day | ORAL | Status: DC
  Administered 2022-12-25 – 2023-01-01 (×8): 1 via ORAL
  Filled 2022-12-25 (×8): qty 1

## 2022-12-25 MED ORDER — SPIRONOLACTONE 12.5 MG HALF TABLET
12.5000 mg | ORAL_TABLET | Freq: Every morning | ORAL | Status: DC
  Administered 2022-12-26 – 2022-12-28 (×3): 12.5 mg via ORAL
  Filled 2022-12-25 (×3): qty 1

## 2022-12-25 MED ORDER — THIAMINE MONONITRATE 100 MG PO TABS
100.0000 mg | ORAL_TABLET | Freq: Every day | ORAL | Status: DC
  Administered 2022-12-25 – 2023-01-01 (×8): 100 mg via ORAL
  Filled 2022-12-25 (×8): qty 1

## 2022-12-25 MED ORDER — EMPAGLIFLOZIN 10 MG PO TABS
10.0000 mg | ORAL_TABLET | Freq: Every day | ORAL | Status: DC
  Administered 2022-12-25 – 2023-01-01 (×8): 10 mg via ORAL
  Filled 2022-12-25 (×8): qty 1

## 2022-12-25 MED ORDER — FUROSEMIDE 10 MG/ML IJ SOLN
80.0000 mg | Freq: Two times a day (BID) | INTRAMUSCULAR | Status: DC
  Administered 2022-12-25 – 2022-12-29 (×8): 80 mg via INTRAVENOUS
  Filled 2022-12-25 (×8): qty 8

## 2022-12-25 MED ORDER — HYDROCORTISONE 1 % EX CREA
TOPICAL_CREAM | CUTANEOUS | Status: DC | PRN
  Filled 2022-12-25: qty 28

## 2022-12-25 NOTE — Progress Notes (Signed)
*  PRELIMINARY RESULTS* Echocardiogram 2D Echocardiogram has been performed.  Francis Cardenas 12/25/2022, 8:50 AM

## 2022-12-25 NOTE — Progress Notes (Addendum)
PROGRESS NOTE    Francis Cardenas  BJY:[id number] DOB: 10-01-1950 DOA: 12/24/2022 PCP: Benjiman Core, MD  72/M with history of chronic diastolic CHF, morbid obesity, heavy alcohol use, hypertension, GERD, OSA, paroxysmal A-fib, prostate cancer, type 2 diabetes mellitus, COPD, CKD 3 AA, PTSD, depression, anxiety, gout, chronic pain presented to the ED with worsening edema and shortness of breath for few months,.  He is on CHF meds including torsemide at baseline, reports compliance with this, has not followed up with cardiology in well over a year.  In the ED vitals stable, sodium 125, potassium 5.4, creatinine 2.3 up from baseline of 1.6, calcium of 10.7, hemoglobin 10.2, troponin negative, BNP 287, chest x-ray noted cardiomegaly and concern for pulmonary edema  Subjective: -Feels a little better today, breathing is improving  Assessment and Plan:  Acute on chronic diastolic CHF -Last echo 4/23 with EF 60-65%, indeterminate diastolic function and normal RV -Continue Lasix, increased dose to 80 Mg twice daily -Resume Jardiance, continue Aldactone for now, may need to discontinue soon if kidney function worsens further -Heart rate is low hold carvedilol -Follow-up repeat echocardiogram today -Place Unna boots -Alcohol cessation counseling, dietitian consult  Hyponatremia Hyperkalemia -Secondary to hypervolemia and diuretics, increase Lasix dose, monitor -Potassium is corrected, cut down Aldactone dose  AKI on CKD 3a -creatinine elevated 2.32 from baseline 1.6. -Likely cardiorenal, avoid hypotension hold amlodipine and hydralazine -Diuretics as above  EtOH abuse -Drinks 8 beers per day, previously drinking 18 beers a day -High risk of withdrawal, add thiamine and multivitamin,, add low-dose small Valium tomorrow, monitor for withdrawal   Hypertension -Holding amlodipine, hydralazine and carvedilol   Hyperlipidemia - Continue home atorvastatin   GERD - Continue home  PPI   Obesity - Noted   OSA - Continue home CPAP   Permanent A-fib -Mild bradycardia, hold Coreg - Continue Eliquis   History of prostate cancer - Noted   Diabetes - SSI   COPD -Continue Dulera, as needed albuterol   PTSD Depression Anxiety - Continue home Xanax   Gout - Holding home allopurinol   DVT prophylaxis:      Eliquis Code Status:              Full Family Communication:       None on admission    DVT prophylaxis: Code Status:  Family Communication: Disposition Plan:   Consultants:    Procedures:   Antimicrobials:    Objective: Vitals:   12/25/22 0107 12/25/22 0155 12/25/22 0417 12/25/22 0730  BP: (!) 102/43 (!) 98/42 (!) 115/48 (!) 106/44  Pulse: (!) 50 (!) 53 (!) 53   Resp:  18 18 18   Temp:   97.8 F (36.6 C) 97.6 F (36.4 C)  TempSrc:   Oral Oral  SpO2: 98% 96% 99%   Weight:   (!) 138 kg   Height:        Intake/Output Summary (Last 24 hours) at 12/25/2022 1030 Last data filed at 12/25/2022 1005 Gross per 24 hour  Intake 226 ml  Output --  Net 226 ml   Filed Weights   12/24/22 0407 12/24/22 1301 12/25/22 0417  Weight: (!) 141.4 kg (!) 138.3 kg (!) 138 kg    Examination:  General exam: Morbidly obese chronically ill male sitting up in the recliner, AAOx3 HEENT: Neck obese unable to assess JVD CVS: S1-S2, irregularly irregular rhythm Lungs: Distant breath sounds, decreased at the bases Abdomen: Soft soft, obese, nontender, bowel sounds present Extremities: 3+ edema  Skin: Skin changes  in lower legs Psychiatry:  Mood & affect appropriate.     Data Reviewed:   CBC: Recent Labs  Lab 12/24/22 0414 12/25/22 0558  WBC 5.9 6.6  NEUTROABS 4.1  --   HGB 10.2* 11.1*  HCT 30.3* 33.3*  MCV 92.7 93.8  PLT 140* 159   Basic Metabolic Panel: Recent Labs  Lab 12/24/22 0414 12/24/22 1454 12/24/22 2140 12/25/22 0558  NA 125* 128* 127* 127*  K 5.4* 5.2* 5.4* 4.9  CL 95* 96* 94* 94*  CO2 21* 22 22 21*  GLUCOSE 107* 115*  126* 116*  BUN 69* 62* 64* 64*  CREATININE 2.32* 2.07* 1.99* 1.98*  CALCIUM 10.7* 10.9* 10.6* 10.8*  MG  --  2.5*  --   --    GFR: Estimated Creatinine Clearance: 46.5 mL/min (A) (by C-G formula based on SCr of 1.98 mg/dL (H)). Liver Function Tests: Recent Labs  Lab 12/24/22 0414  AST 17  ALT 18  ALKPHOS 85  BILITOT 0.6  PROT 6.3*  ALBUMIN 4.2   No results for input(s): "LIPASE", "AMYLASE" in the last 168 hours. No results for input(s): "AMMONIA" in the last 168 hours. Coagulation Profile: No results for input(s): "INR", "PROTIME" in the last 168 hours. Cardiac Enzymes: No results for input(s): "CKTOTAL", "CKMB", "CKMBINDEX", "TROPONINI" in the last 168 hours. BNP (last 3 results) No results for input(s): "PROBNP" in the last 8760 hours. HbA1C: No results for input(s): "HGBA1C" in the last 72 hours. CBG: Recent Labs  Lab 12/24/22 1105 12/24/22 1657 12/24/22 2234 12/25/22 0648  GLUCAP 124* 121* 131* 117*   Lipid Profile: No results for input(s): "CHOL", "HDL", "LDLCALC", "TRIG", "CHOLHDL", "LDLDIRECT" in the last 72 hours. Thyroid Function Tests: No results for input(s): "TSH", "T4TOTAL", "FREET4", "T3FREE", "THYROIDAB" in the last 72 hours. Anemia Panel: No results for input(s): "VITAMINB12", "FOLATE", "FERRITIN", "TIBC", "IRON", "RETICCTPCT" in the last 72 hours. Urine analysis:    Component Value Date/Time   COLORURINE COLORLESS (A) 12/24/2022 0628   APPEARANCEUR CLEAR 12/24/2022 0628   LABSPEC 1.007 12/24/2022 0628   PHURINE 5.5 12/24/2022 0628   GLUCOSEU NEGATIVE 12/24/2022 0628   HGBUR NEGATIVE 12/24/2022 0628   BILIRUBINUR NEGATIVE 12/24/2022 0628   KETONESUR NEGATIVE 12/24/2022 0628   PROTEINUR NEGATIVE 12/24/2022 0628   UROBILINOGEN 0.2 03/24/2013 1255   NITRITE NEGATIVE 12/24/2022 0628   LEUKOCYTESUR NEGATIVE 12/24/2022 0628   Sepsis Labs: @LABRCNTIP (procalcitonin:4,lacticidven:4)  ) Recent Results (from the past 240 hour(s))  MRSA Next Gen by  PCR, Nasal     Status: Abnormal   Collection Time: 12/24/22  1:53 PM   Specimen: Nasal Mucosa; Nasal Swab  Result Value Ref Range Status   MRSA by PCR Next Gen DETECTED (A) NOT DETECTED Final    Comment: CRITICAL RESULT CALLED TO, READ BACK BY AND VERIFIED WITH: RN Donia Ast [phone] [id number] BY J RAZZAK, MT (NOTE) The GeneXpert MRSA Assay (FDA approved for NASAL specimens only), is one component of a comprehensive MRSA colonization surveillance program. It is not intended to diagnose MRSA infection nor to guide or monitor treatment for MRSA infections. Test performance is not FDA approved in patients less than 18 years old. Performed at [facility], [address] [place], [place] [address]      Radiology Studies: DG Chest Port 1 View  Result Date: 12/24/2022 CLINICAL DATA:  Shortness of breath, edema. Leg swelling for 2 months with orthopnea. EXAM: PORTABLE CHEST 1 VIEW COMPARISON:  10/11/2022 FINDINGS: Hazy density at the bases. Chronic cardiomegaly. There  could be a hiatal hernia with gas bubble projecting over the left lung base. Stable central vessels. The left lateral costophrenic sulcus is excluded from view. No visible pleural fluid. No pneumothorax. IMPRESSION: Symmetric hazy density at the bases, history suggesting edema. Stable cardiomegaly. Electronically Signed   By: Tiburcio Pea M.D.   On: 12/24/2022 05:01     Scheduled Meds:  ALPRAZolam  1 mg Oral QHS   apixaban  5 mg Oral BID   atorvastatin  40 mg Oral Q supper   Chlorhexidine Gluconate Cloth  6 each Topical Q0600   furosemide  80 mg Intravenous BID   gabapentin  300 mg Oral TID   insulin aspart  0-9 Units Subcutaneous TID WC   mometasone-formoterol  2 puff Inhalation BID   mupirocin ointment  1 Application Nasal BID   pantoprazole  40 mg Oral Daily   sodium chloride flush  3 mL Intravenous Q12H   spironolactone  25 mg Oral q morning   umeclidinium bromide  1 puff Inhalation Daily   Continuous  Infusions:   LOS: 0 days    Time spent:    Zannie Cove, MD [facility]   12/25/2022, 10:30 AM

## 2022-12-25 NOTE — Plan of Care (Signed)

## 2022-12-26 ENCOUNTER — Encounter (HOSPITAL_COMMUNITY): Payer: Self-pay | Admitting: *Deleted

## 2022-12-26 DIAGNOSIS — I5033 Acute on chronic diastolic (congestive) heart failure: Secondary | ICD-10-CM | POA: Diagnosis not present

## 2022-12-26 LAB — GLUCOSE, CAPILLARY
Glucose-Capillary: 144 mg/dL — ABNORMAL HIGH (ref 70–99)
Glucose-Capillary: 132 mg/dL — ABNORMAL HIGH (ref 70–99)
Glucose-Capillary: 145 mg/dL — ABNORMAL HIGH (ref 70–99)
Glucose-Capillary: 125 mg/dL — ABNORMAL HIGH (ref 70–99)

## 2022-12-26 NOTE — Plan of Care (Signed)

## 2022-12-26 NOTE — Progress Notes (Signed)
   12/25/22 2209  BiPAP/CPAP/SIPAP  $ Non-Invasive Home Ventilator  Subsequent  BiPAP/CPAP/SIPAP Pt Type Adult  BiPAP/CPAP/SIPAP Resmed  Mask Type Full face mask  Mask Size Large  EPAP 14 cmH2O  FiO2 (%) 21 %  Patient Home Equipment No  CPAP/SIPAP surface wiped down Yes

## 2022-12-26 NOTE — Progress Notes (Signed)
Heart Failure Nurse Navigator Progress Note  PCP: Benjiman Core, MD PCP-Cardiologist: Cristal Deer Admission Diagnosis: Acute on chronic diastolic heart failure, AKI, Atrial Fibrillation, Hyponatremia, Normochromic anemia, Thrombocytopenia.  Admitted from: Home  Presentation:   Francis Cardenas presented with +3 leg swelling that has gone on for more then 2 months and is now weeping. Has had shortness of breath, nonproductive cough, and chest pressure. Patient reports to sleeping in his recliner chair every night and drinking at least 8 beers per day. Starting from whn he wakes up. BNP 287, BMI 42.72, EKG showed Atrial Fibrillation, IV lasix given, CXR cardiomegaly and suggestive of pulmonary edema,   Patient was educated on the sign and symptoms of heart failure, daily weights, when to call his doctor or go to the ED, Diet/ fluid restrictions ( reports to drinking usually only beer through out his day, ) educated on taking all medications as prescribed and attending all medical appointments. Patient verbalized his understanding and a Hf TOC appointment was scheduled for 01/05/2023 @ 9 am.   ECHO/ LVEF: 60-65%   Clinical Course:  Past Medical History:  Diagnosis Date   Alcohol dependence (HCC)    BPH (benign prostatic hyperplasia)    Cancer (HCC)    prostate   COPD (chronic obstructive pulmonary disease) (HCC)    Diabetes mellitus without complication (HCC)    Dyspnea on exertion 07/14/2022   Elevated troponin 04/06/2019   Fall at home, initial encounter 09/14/2022   Hypercholesteremia    Hypertension    OSA on CPAP      Social History   Socioeconomic History   Marital status: Married    Spouse name: Not on file   Number of children: Not on file   Years of education: Not on file   Highest education level: Not on file  Occupational History   Not on file  Tobacco Use   Smoking status: Former    Current packs/day: 0.00    Average packs/day: 1 pack/day for 13.0 years  (13.0 ttl pk-yrs)    Types: Cigarettes    Start date: 64    Quit date: 53    Years since quitting: 44.6    Passive exposure: Never   Smokeless tobacco: Never  Vaping Use   Vaping status: Never Used  Substance and Sexual Activity   Alcohol use: Yes    Alcohol/week: 8.0 standard drinks of alcohol    Types: 8 Cans of beer per week    Comment: 18 beers per day   Drug use: No   Sexual activity: Not Currently  Other Topics Concern   Not on file  Social History Narrative   Not on file   Social Determinants of Health   Financial Resource Strain: Not on file  Food Insecurity: No Food Insecurity (12/24/2022)   Hunger Vital Sign    Worried About Running Out of Food in the Last Year: Never true    Ran Out of Food in the Last Year: Never true  Transportation Needs: No Transportation Needs (12/24/2022)   PRAPARE - Administrator, Civil Service (Medical): No    Lack of Transportation (Non-Medical): No  Physical Activity: Not on file  Stress: Not on file  Social Connections: Not on file   Education Assessment and Provision:  Detailed education and instructions provided on heart failure disease management including the following:  Signs and symptoms of Heart Failure When to call the physician Importance of daily weights Low sodium diet Fluid restriction Medication management Anticipated  future follow-up appointments  Patient education given on each of the above topics.  Patient acknowledges understanding via teach back method and acceptance of all instructions.  Education Materials:  "Living Better With Heart Failure" Booklet, HF zone tool, & Daily Weight Tracker Tool.  Patient has scale at home: Yes Patient has pill box at home: NA    High Risk Criteria for Readmission and/or Poor Patient Outcomes: Heart failure hospital admissions (last 6 months): 1  No Show rate: 0 Difficult social situation: No, lives with his wife Demonstrates medication adherence:  Yes Primary Language: English  Literacy level: Reading, writing, and comprehension.   Barriers of Care:   Diet/ fluid restrictions ( Beer) Daily weights  Considerations/Referrals:   Referral made to Heart Failure Pharmacist Stewardship: Yes Referral made to Heart Failure CSW/NCM TOC: No Referral made to Heart & Vascular TOC clinic: Yes, 01/05/2023 @ 9 am.   Items for Follow-up on DC/TOC: Diet/ fluid restrictions ( beer) Daily weights Continued HF education   Rhae Hammock, BSN, RN Heart Failure Print production planner Chat Only

## 2022-12-26 NOTE — Progress Notes (Signed)
   Heart Failure Stewardship Pharmacist Progress Note   PCP: Benjiman Core, MD PCP-Cardiologist: Jodelle Red, MD    HPI:  72 yo M with PMH of CHF, afib, COPD, T2DM, HTN, HLD, OSA on CPAP, CKD III, obesity, prostate cancer, and alcohol dependence.   Presented to the ED on 8/3 with LE edema, shortness of breath, orthopnea, and chest pressure. CXR with cardiomegaly and pulmonary edema. BNP 287. ECHO 8/4 showed LVEF 60-65%, no RWMA, mild LVH, RV mildly reduced, mildly elevated PA pressure, trivial MR.   Current HF Medications: Diuretic: furosemide 80 mg IV BID MRA: spironolactone 12.5 mg daily SGLT2i: Jardiance 10 mg daily  Prior to admission HF Medications: Diuretic: torsemide 20 mg BID Beta blocker: metoprolol XL 12.5 mg daily MRA: spironolactone 25 mg daily SGLT2i: Jardiance 12.5 mg BID  Pertinent Lab Values: Serum creatinine 1.95, BUN 60, Potassium 4.6, Sodium 131, BNP 287.3, Magnesium 2.5   Vital Signs: Weight: 297 lbs (admission weight: 304 lbs) Blood pressure: 110/60s  Heart rate: 60-70s  I/O: incomplete yesterday; net -1.7L since admission (inaccurate)  Medication Assistance / Insurance Benefits Check: Does the patient have prescription insurance?  Yes Type of insurance plan: [facility]  Outpatient Pharmacy:  Prior to admission outpatient pharmacy: [facility] Is the patient willing to use [facility] TOC pharmacy at discharge? Yes Is the patient willing to transition their outpatient pharmacy to utilize a [facility] outpatient pharmacy?   Pending    Assessment: 1. Acute on chronic diastolic CHF (LVEF 60-65%). NYHA class III symptoms. - Continue furosemide 80 mg IV BID. Strict I/Os and daily weights. Keep K>4 and Mg>2. - Holding carvedilol with bradycardia on admission - No ACE/ARB/ARNI with AKI on CKD. Baseline appears ~1.6. Has been stable ~1.9. - Continue spironolactone 12.5 mg daily - Continue Jardiance 10 mg daily - was taking 12.5 mg BID prior to  admission. Likely can consolidate to 25 mg once daily at discharge.   Plan: 1) Medication changes recommended at this time: - Continue IV diuresis  2) Patient assistance: - None - has [facility] benefits  3)  Education  - Unable for education - working with mobility  Sharen Hones, PharmD, Field seismologist Phone [phone]

## 2022-12-26 NOTE — Progress Notes (Signed)
PROGRESS NOTE    Francis Cardenas  ZOX:[id number] DOB: 1951/02/07 DOA: 12/24/2022 PCP: Benjiman Core, MD  71/M with history of chronic diastolic CHF, morbid obesity, heavy alcohol use, hypertension, GERD, OSA, paroxysmal A-fib, prostate cancer, type 2 diabetes mellitus, COPD, CKD 3 AA, PTSD, depression, anxiety, gout, chronic pain presented to the ED with worsening edema and shortness of breath for few months,.  He is on CHF meds including torsemide at baseline, reports compliance with this, has not followed up with cardiology in well over a year.  In the ED vitals stable, sodium 125, potassium 5.4, creatinine 2.3 up from baseline of 1.6, calcium of 10.7, hemoglobin 10.2, troponin negative, BNP 287, chest x-ray noted cardiomegaly and concern for pulmonary edema  Subjective: -Feels a little better today, breathing is improving  Assessment and Plan:  Acute on chronic diastolic CHF -Last echo 4/23 with EF 60-65%, indeterminate diastolic function and normal RV -Continue Lasix 80 Mg twice daily, 1.7 L negative, suspect urine output is inaccurate -Continue Jardiance, on low-dose Aldactone, may need to discontinue if kidney function worsens or potassium trends up -Carvedilol held with bradycardia -Repeat echo with EF 60-65%, mildly diminished RV, moderately enlarged -Una boots ordered, pending -Alcohol cessation counseling, dietitian consult  Hyponatremia Hyperkalemia -Secondary to hypervolemia and diuretics, improving -Potassium is corrected, cut down Aldactone dose  AKI on CKD 3a -creatinine elevated 2.32 from baseline 1.6. -Likely cardiorenal, avoid hypotension hold amlodipine and hydralazine -Diuretics as above  EtOH abuse -Drinks 8 beers per day, previously drinking 18 beers a day -High risk of withdrawal, continue thiamine and multivitamin, monitor for withdrawal   Hypertension -Holding amlodipine, hydralazine and carvedilol   Hyperlipidemia - Continue home  atorvastatin   GERD - Continue home PPI   Obesity - Noted   OSA - Continue home CPAP   Permanent A-fib -Mild bradycardia, hold Coreg - Continue Eliquis   History of prostate cancer - Noted   Diabetes - SSI   COPD -Continue Dulera, as needed albuterol   PTSD Depression Anxiety - Continue home Xanax   Gout - Holding home allopurinol   DVT prophylaxis:      Eliquis Code Status:              Full Family Communication:       None on admission  Disposition Plan: Home likely 3 to 4 days, may need rehab  Consultants:    Procedures:   Antimicrobials:    Objective: Vitals:   12/25/22 2015 12/25/22 2027 12/26/22 0521 12/26/22 0744  BP:  102/76 (!) 116/55 (!) 130/48  Pulse: (!) 59 (!) 57 (!) 55 60  Resp:  18 18 20   Temp:  98.2 F (36.8 C) 98.2 F (36.8 C) 98.4 F (36.9 C)  TempSrc:  Oral Oral Oral  SpO2: 98% 98% 99% 99%  Weight:   (!) 136.9 kg   Height:        Intake/Output Summary (Last 24 hours) at 12/26/2022 1150 Last data filed at 12/26/2022 0745 Gross per 24 hour  Intake 440 ml  Output 1120 ml  Net -680 ml   Filed Weights   12/24/22 1301 12/25/22 0417 12/26/22 0521  Weight: (!) 138.3 kg (!) 138 kg (!) 136.9 kg    Examination:  General exam: Obese chronically ill male sitting up in recliner, AAOx3 HEENT: Neck obese unable to assess JVD CVS: S1-S2, irregularly irregular rhythm Lungs: Distant breath sounds, poor air movement bilaterally Abdomen: Soft, obese, nontender, bowel sounds present  Extremities: 3+ edema  Skin:  Skin changes in lower legs Psychiatry:  Mood & affect appropriate.     Data Reviewed:   CBC: Recent Labs  Lab 12/24/22 0414 12/25/22 0558 12/26/22 0451  WBC 5.9 6.6 6.1  NEUTROABS 4.1  --   --   HGB 10.2* 11.1* 10.2*  HCT 30.3* 33.3* 31.2*  MCV 92.7 93.8 93.4  PLT 140* 159 151   Basic Metabolic Panel: Recent Labs  Lab 12/24/22 0414 12/24/22 1454 12/24/22 2140 12/25/22 0558 12/26/22 0451  NA 125* 128* 127*  127* 129*  K 5.4* 5.2* 5.4* 4.9 4.7  CL 95* 96* 94* 94* 94*  CO2 21* 22 22 21* 24  GLUCOSE 107* 115* 126* 116* 124*  BUN 69* 62* 64* 64* 63*  CREATININE 2.32* 2.07* 1.99* 1.98* 1.95*  CALCIUM 10.7* 10.9* 10.6* 10.8* 10.4*  MG  --  2.5*  --   --   --    GFR: Estimated Creatinine Clearance: 47.1 mL/min (A) (by C-G formula based on SCr of 1.95 mg/dL (H)). Liver Function Tests: Recent Labs  Lab 12/24/22 0414  AST 17  ALT 18  ALKPHOS 85  BILITOT 0.6  PROT 6.3*  ALBUMIN 4.2   No results for input(s): "LIPASE", "AMYLASE" in the last 168 hours. No results for input(s): "AMMONIA" in the last 168 hours. Coagulation Profile: No results for input(s): "INR", "PROTIME" in the last 168 hours. Cardiac Enzymes: No results for input(s): "CKTOTAL", "CKMB", "CKMBINDEX", "TROPONINI" in the last 168 hours. BNP (last 3 results) No results for input(s): "PROBNP" in the last 8760 hours. HbA1C: No results for input(s): "HGBA1C" in the last 72 hours. CBG: Recent Labs  Lab 12/25/22 1204 12/25/22 1631 12/25/22 2143 12/26/22 0741 12/26/22 1142  GLUCAP 117* 135* 119* 144* 132*   Lipid Profile: No results for input(s): "CHOL", "HDL", "LDLCALC", "TRIG", "CHOLHDL", "LDLDIRECT" in the last 72 hours. Thyroid Function Tests: No results for input(s): "TSH", "T4TOTAL", "FREET4", "T3FREE", "THYROIDAB" in the last 72 hours. Anemia Panel: No results for input(s): "VITAMINB12", "FOLATE", "FERRITIN", "TIBC", "IRON", "RETICCTPCT" in the last 72 hours. Urine analysis:    Component Value Date/Time   COLORURINE COLORLESS (A) 12/24/2022 0628   APPEARANCEUR CLEAR 12/24/2022 0628   LABSPEC 1.007 12/24/2022 0628   PHURINE 5.5 12/24/2022 0628   GLUCOSEU NEGATIVE 12/24/2022 0628   HGBUR NEGATIVE 12/24/2022 0628   BILIRUBINUR NEGATIVE 12/24/2022 0628   KETONESUR NEGATIVE 12/24/2022 0628   PROTEINUR NEGATIVE 12/24/2022 0628   UROBILINOGEN 0.2 03/24/2013 1255   NITRITE NEGATIVE 12/24/2022 0628   LEUKOCYTESUR  NEGATIVE 12/24/2022 0628   Sepsis Labs: @LABRCNTIP (procalcitonin:4,lacticidven:4)  ) Recent Results (from the past 240 hour(s))  MRSA Next Gen by PCR, Nasal     Status: Abnormal   Collection Time: 12/24/22  1:53 PM   Specimen: Nasal Mucosa; Nasal Swab  Result Value Ref Range Status   MRSA by PCR Next Gen DETECTED (A) NOT DETECTED Final    Comment: CRITICAL RESULT CALLED TO, READ BACK BY AND VERIFIED WITH: RN Donia Ast [id number] BY J RAZZAK, MT (NOTE) The GeneXpert MRSA Assay (FDA approved for NASAL specimens only), is one component of a comprehensive MRSA colonization surveillance program. It is not intended to diagnose MRSA infection nor to guide or monitor treatment for MRSA infections. Test performance is not FDA approved in patients less than 15 years old. Performed at [facility], [address], [place], [place] [address]      Radiology Studies: ECHOCARDIOGRAM COMPLETE  Result Date: 12/25/2022    ECHOCARDIOGRAM REPORT  Patient Name:   Francis Cardenas Date of Exam: 12/25/2022 Medical Rec #:  [id number]      Height:       70.0 in Accession #:    [id number]     Weight:       304.2 lb Date of Birth:  March 04, 1951     BSA:          2.495 m Patient Age:    18 years       BP:           106/44 mmHg Patient Gender: M              HR:           60 bpm. Exam Location:  Inpatient Procedure: 2D Echo, Cardiac Doppler, Color Doppler and Intracardiac            Opacification Agent Indications:    CHF-Acute Diastolic I50.31  History:        Patient has prior history of Echocardiogram examinations, most                 recent 08/31/2021. CHF, COPD; Risk Factors:Sleep Apnea, Diabetes,                 Former Smoker and Dyslipidemia.  Sonographer:    Dondra Prader RVT RCS Referring Phys: [id number] Cecille Po [facility]  Sonographer Comments: Technically difficult study due to poor echo windows and patient is obese. IMPRESSIONS  1. Left ventricular ejection fraction, by estimation, is 60 to 65%. The  left ventricle has normal function. The left ventricle has no regional wall motion abnormalities. There is mild left ventricular hypertrophy. Left ventricular diastolic parameters are indeterminate.  2. Right ventricular systolic function is mildly reduced. The right ventricular size is moderately enlarged. There is mildly elevated pulmonary artery systolic pressure. The estimated right ventricular systolic pressure is 40.5 mmHg.  3. Left atrial size was moderately dilated.  4. Right atrial size was mildly dilated.  5. A small pericardial effusion is present. The pericardial effusion is anterior to the right ventricle.  6. The mitral valve is grossly normal. Trivial mitral valve regurgitation. No evidence of mitral stenosis.  7. The aortic valve is tricuspid. Aortic valve regurgitation is not visualized. Aortic valve sclerosis/calcification is present, without any evidence of aortic stenosis.  8. The inferior vena cava is dilated in size with >50% respiratory variability, suggesting right atrial pressure of 8 mmHg. FINDINGS  Left Ventricle: Left ventricular ejection fraction, by estimation, is 60 to 65%. The left ventricle has normal function. The left ventricle has no regional wall motion abnormalities. Definity contrast agent was given IV to delineate the left ventricular  endocardial borders. The left ventricular internal cavity size was normal in size. There is mild left ventricular hypertrophy. Left ventricular diastolic parameters are indeterminate. Right Ventricle: The right ventricular size is moderately enlarged. No increase in right ventricular wall thickness. Right ventricular systolic function is mildly reduced. There is mildly elevated pulmonary artery systolic pressure. The tricuspid regurgitant velocity is 2.85 m/s, and with an assumed right atrial pressure of 8 mmHg, the estimated right ventricular systolic pressure is 40.5 mmHg. Left Atrium: Left atrial size was moderately dilated. Right Atrium:  Right atrial size was mildly dilated. Pericardium: A small pericardial effusion is present. The pericardial effusion is anterior to the right ventricle. Presence of epicardial fat layer. Mitral Valve: The mitral valve is grossly normal. Trivial mitral valve regurgitation. No evidence of mitral valve stenosis. Tricuspid Valve: The tricuspid  valve is normal in structure. Tricuspid valve regurgitation is trivial. No evidence of tricuspid stenosis. Aortic Valve: The aortic valve is tricuspid. Aortic valve regurgitation is not visualized. Aortic valve sclerosis/calcification is present, without any evidence of aortic stenosis. Aortic valve mean gradient measures 5.0 mmHg. Aortic valve peak gradient measures 9.4 mmHg. Aortic valve area, by VTI measures 3.61 cm. Pulmonic Valve: The pulmonic valve was not well visualized. Pulmonic valve regurgitation is not visualized. No evidence of pulmonic stenosis. Aorta: The aortic root is normal in size and structure. Ascending aorta measurements are within normal limits for age when indexed to body surface area. Venous: The inferior vena cava is dilated in size with greater than 50% respiratory variability, suggesting right atrial pressure of 8 mmHg. IAS/Shunts: No atrial level shunt detected by color flow Doppler.  LEFT VENTRICLE PLAX 2D LVIDd:         5.40 cm   Diastology LVIDs:         2.80 cm   LV e' medial: 9.73 cm/s LV PW:         1.30 cm LV IVS:        1.00 cm LVOT diam:     2.30 cm LV SV:         118 LV SV Index:   47 LVOT Area:     4.15 cm  RIGHT VENTRICLE             IVC RV Basal diam:  4.40 cm     IVC diam: 2.50 cm RV S prime:     15.30 cm/s TAPSE (M-mode): 2.5 cm LEFT ATRIUM              Index        RIGHT ATRIUM           Index LA diam:        4.30 cm  1.72 cm/m   RA Area:     23.90 cm LA Vol (A2C):   118.0 ml 47.30 ml/m  RA Volume:   77.40 ml  31.03 ml/m LA Vol (A4C):   104.0 ml 41.69 ml/m LA Biplane Vol: 116.0 ml 46.50 ml/m  AORTIC VALVE                      PULMONIC VALVE AV Area (Vmax):    3.49 cm      PV Vmax:       0.81 m/s AV Area (Vmean):   3.40 cm      PV Peak grad:  2.7 mmHg AV Area (VTI):     3.61 cm AV Vmax:           153.50 cm/s AV Vmean:          103.000 cm/s AV VTI:            0.326 m AV Peak Grad:      9.4 mmHg AV Mean Grad:      5.0 mmHg LVOT Vmax:         129.00 cm/s LVOT Vmean:        84.200 cm/s LVOT VTI:          0.283 m LVOT/AV VTI ratio: 0.87  AORTA Ao Root diam: 3.10 cm Ao Asc diam:  3.80 cm TRICUSPID VALVE TR Peak grad:   32.5 mmHg TR Vmax:        285.00 cm/s  SHUNTS Systemic VTI:  0.28 m Systemic Diam: 2.30 cm Weston Brass MD Electronically signed by Weston Brass MD  Signature Date/Time: 12/25/2022/10:47:22 AM    Final      Scheduled Meds:  ALPRAZolam  1 mg Oral QHS   apixaban  5 mg Oral BID   atorvastatin  40 mg Oral Q supper   Chlorhexidine Gluconate Cloth  6 each Topical Q0600   empagliflozin  10 mg Oral Daily   furosemide  80 mg Intravenous BID   gabapentin  300 mg Oral TID   insulin aspart  0-9 Units Subcutaneous TID WC   mometasone-formoterol  2 puff Inhalation BID   multivitamin with minerals  1 tablet Oral Daily   mupirocin ointment  1 Application Nasal BID   pantoprazole  40 mg Oral Daily   sodium chloride flush  3 mL Intravenous Q12H   spironolactone  12.5 mg Oral q morning   thiamine  100 mg Oral Daily   umeclidinium bromide  1 puff Inhalation Daily   Continuous Infusions:   LOS: 1 day    Time spent:    Zannie Cove, MD [facility]   12/26/2022, 11:50 AM

## 2022-12-26 NOTE — Progress Notes (Signed)
Orthopedic Tech Progress Note Patient Details:  Francis Cardenas 1950-08-29 [id number]  Ortho Devices Type of Ortho Device: Radio broadcast assistant Ortho Device/Splint Location: bi-lateral Ortho Device/Splint Interventions: Ordered, Application, Adjustment  I was called by the rn to apply the unna boots. The rn held the legs for me while I applied the unna boots. Post Interventions Patient Tolerated: Well Instructions Provided: Care of device, Adjustment of device  Trinna Post 12/26/2022, 9:08 PM

## 2022-12-26 NOTE — Progress Notes (Signed)
   12/26/22 2255  BiPAP/CPAP/SIPAP  $ Face Mask Large  Yes  BiPAP/CPAP/SIPAP Pt Type Adult  BiPAP/CPAP/SIPAP Resmed  Mask Type Full face mask  Mask Size Large  EPAP 14 cmH2O  FiO2 (%) 21 %  Patient Home Equipment No  Auto Titrate No  Safety Check Completed by RT for Home Unit Yes, no issues noted

## 2022-12-26 NOTE — Evaluation (Signed)
Occupational Therapy Evaluation Patient Details Name: ABDUR NITCHER MRN: [id number] DOB: 17-May-1951 Today's Date: 12/26/2022   History of Present Illness Pt is a 72 y/o M presenting to ED On 8/3 with BLE edema and SOB. PMH includes HTN, HLD, COPD, sleep apnea, paroxysmal A fib on apabaxin, diastolic heart failure, GERD, OSA, morbid obesity, heavy alcohol use, prostate CA, DM2, COPD, CKD III, PTSD, depression, gout, anxiety   Clinical Impression   Pt reports ind at baseline with ADLs although takes increased time due to SOB, specifically bathing/LB dressing. Pt uses rollator for mobility, lives with spouse. Pt currently needing supervision-mod A for ADLs, and supervision for transfers with rollator. Pt with 2/4 DOE after short distance ambulation, SpO2 down to mid 80's with poor wave pleth on RA, increased to mid 90's once seated with good wave pleth. Would benefit from trial of AE for LB ADLs in future sessions. Pt presenting with impairments listed below, will follow acutely. Recommend HHOT at d/c.      Recommendations for follow up therapy are one component of a multi-disciplinary discharge planning process, led by the attending physician.  Recommendations may be updated based on patient status, additional functional criteria and insurance authorization.   Assistance Recommended at Discharge Intermittent Supervision/Assistance  Patient can return home with the following A lot of help with bathing/dressing/bathroom;Assistance with cooking/housework;Help with stairs or ramp for entrance;Assist for transportation    Functional Status Assessment  Patient has had a recent decline in their functional status and demonstrates the ability to make significant improvements in function in a reasonable and predictable amount of time.  Equipment Recommendations  None recommended by OT (pt has all needed DME)    Recommendations for Other Services PT consult     Precautions / Restrictions  Precautions Precautions: Fall Precaution Comments: 6 falls since April 2024 Restrictions Weight Bearing Restrictions: No      Mobility Bed Mobility               General bed mobility comments: OOB in chair upon arrival and departure    Transfers Overall transfer level: Needs assistance Equipment used: Rollator (4 wheels) Transfers: Sit to/from Stand Sit to Stand: Supervision                  Balance Overall balance assessment: History of Falls, Needs assistance Sitting-balance support: Feet supported Sitting balance-Leahy Scale: Good     Standing balance support: During functional activity, Reliant on assistive device for balance Standing balance-Leahy Scale: Fair                             ADL either performed or assessed with clinical judgement   ADL Overall ADL's : Needs assistance/impaired Eating/Feeding: Supervision/ safety   Grooming: Supervision/safety;Standing;Sitting   Upper Body Bathing: Minimal assistance;Sitting   Lower Body Bathing: Moderate assistance;Sitting/lateral leans   Upper Body Dressing : Minimal assistance   Lower Body Dressing: Moderate assistance   Toilet Transfer: Supervision/safety;Rollator (4 wheels)   Toileting- Clothing Manipulation and Hygiene: Minimal assistance;Sitting/lateral lean       Functional mobility during ADLs: Supervision/safety;Rollator (4 wheels)       Vision   Vision Assessment?: No apparent visual deficits     Perception Perception Perception Tested?: No   Praxis Praxis Praxis tested?: Not tested    Pertinent Vitals/Pain Pain Assessment Pain Assessment: No/denies pain     Hand Dominance Right   Extremity/Trunk Assessment Upper Extremity Assessment Upper Extremity Assessment: Generalized  weakness (decreased fine motor coordination)   Lower Extremity Assessment Lower Extremity Assessment: Defer to PT evaluation   Cervical / Trunk Assessment Cervical / Trunk Assessment:  Normal   Communication Communication Communication: No difficulties   Cognition Arousal/Alertness: Awake/alert Behavior During Therapy: WFL for tasks assessed/performed Overall Cognitive Status: Within Functional Limits for tasks assessed                                 General Comments: tangential in conversation     General Comments  SpO2 down to mid 80's with poor wave pleth, increased to 94% with good wave pleth, 2/4 DOE after short distance ambulation    Exercises     Shoulder Instructions      Home Living Family/patient expects to be discharged to:: Private residence Living Arrangements: Spouse/significant other Available Help at Discharge: Family;Available PRN/intermittently Type of Home: House Home Access: Ramped entrance     Home Layout: One level     Bathroom Shower/Tub: Producer, television/film/video: Handicapped height Bathroom Accessibility: No   Home Equipment: Rollator (4 wheels);Shower seat;Grab bars - tub/shower;Electric scooter          Prior Functioning/Environment Prior Level of Function : Independent/Modified Independent             Mobility Comments: Uses rollator periodically in the house ADLs Comments: reports infrequent bathing (once every few months), difficulty with LB dressing, spouse does cooking/cleaning        OT Problem List: Decreased strength;Decreased range of motion;Decreased activity tolerance;Impaired balance (sitting and/or standing);Cardiopulmonary status limiting activity      OT Treatment/Interventions: Therapeutic exercise;Self-care/ADL training;Energy conservation;DME and/or AE instruction;Therapeutic activities;Balance training;Patient/family education    OT Goals(Current goals can be found in the care plan section) Acute Rehab OT Goals Patient Stated Goal: to get legs wrapped OT Goal Formulation: With patient Time For Goal Achievement: 01/09/23 Potential to Achieve Goals: Good ADL Goals Pt  Will Perform Upper Body Dressing: with min guard assist;sitting Pt Will Perform Lower Body Dressing: with min guard assist;with adaptive equipment;sitting/lateral leans;sit to/from stand Pt Will Transfer to Toilet: with modified independence;ambulating;regular height toilet Pt/caregiver will Perform Home Exercise Program: Both right and left upper extremity;With written HEP provided;Increased ROM;Increased strength;With Supervision Additional ADL Goal #1: pt will verbalize x3 energy conservation strategies in prep for ADLs  OT Frequency: Min 1X/week    Co-evaluation              AM-PAC OT "6 Clicks" Daily Activity     Outcome Measure Help from another person eating meals?: None Help from another person taking care of personal grooming?: A Little Help from another person toileting, which includes using toliet, bedpan, or urinal?: A Little Help from another person bathing (including washing, rinsing, drying)?: A Lot Help from another person to put on and taking off regular upper body clothing?: A Little Help from another person to put on and taking off regular lower body clothing?: A Lot 6 Click Score: 17   End of Session Equipment Utilized During Treatment: Rollator (4 wheels) Nurse Communication: Mobility status  Activity Tolerance: Patient tolerated treatment well Patient left: with call bell/phone within reach;in chair  OT Visit Diagnosis: Unsteadiness on feet (R26.81);Other abnormalities of gait and mobility (R26.89);Muscle weakness (generalized) (M62.81);History of falling (Z91.81)                Time: 1610-9604 OT Time Calculation (min): 44 min Charges:  OT General  Charges $OT Visit: 1 Visit OT Evaluation $OT Eval Moderate Complexity: 1 Mod OT Treatments $Therapeutic Activity: 23-37 mins  Carver Fila, OTD, OTR/L SecureChat Preferred Acute Rehab [phone]   [name] 12/26/2022, 5:13 PM

## 2022-12-26 NOTE — Plan of Care (Signed)
  Problem: Clinical Measurements: Goal: Ability to maintain clinical measurements within normal limits will improve Outcome: Progressing Goal: Respiratory complications will improve Outcome: Progressing Goal: Cardiovascular complication will be avoided Outcome: Progressing   Problem: Coping: Goal: Level of anxiety will decrease Outcome: Progressing   Problem: Pain Managment: Goal: General experience of comfort will improve Outcome: Progressing   Problem: Safety: Goal: Ability to remain free from injury will improve Outcome: Progressing

## 2022-12-27 DIAGNOSIS — I5033 Acute on chronic diastolic (congestive) heart failure: Secondary | ICD-10-CM | POA: Diagnosis not present

## 2022-12-27 LAB — GLUCOSE, CAPILLARY
Glucose-Capillary: 121 mg/dL — ABNORMAL HIGH (ref 70–99)
Glucose-Capillary: 108 mg/dL — ABNORMAL HIGH (ref 70–99)
Glucose-Capillary: 156 mg/dL — ABNORMAL HIGH (ref 70–99)
Glucose-Capillary: 138 mg/dL — ABNORMAL HIGH (ref 70–99)

## 2022-12-27 MED ORDER — ORAL CARE MOUTH RINSE: 15.0000 mL | OROMUCOSAL | Status: DC | PRN

## 2022-12-27 NOTE — TOC CM/SW Note (Signed)
Transition of Care [facility]) - Inpatient Brief Assessment   Patient Details  Name: Francis Cardenas MRN: [id number] Date of Birth: October 30, 1950  Transition of [facility]) CM/SW Contact:    Gala Lewandowsky, RN Phone Number: 12/27/2022, 3:22 PM   Clinical Narrative: Patient presented for worsening edema and shortness of breath.  Case Manager called the [facility] Fees Coordinator to make them aware of hospitalization. Patient is a member of the [facility]; PCP is Ellin Mayhew and CSW is Kennon Portela # [phone] ext [phone]. Case Manager will continue to follow for transition of care needs as the patient progresses.    Transition of Care Asessment: Insurance and Status: Insurance coverage has been reviewed Patient has primary care physician: Yes  Prior/Current Home Services: No current home services Social Determinants of Health Reivew: SDOH reviewed no interventions necessary Readmission risk has been reviewed: Yes Transition of care needs: transition of care needs identified, TOC will continue to follow

## 2022-12-27 NOTE — Progress Notes (Signed)
PROGRESS NOTE    AUDREY SUDBURY  GNF:[id number] DOB: 1951-05-09 DOA: 12/24/2022 PCP: Benjiman Core, MD  71/M with history of chronic diastolic CHF, morbid obesity, heavy alcohol use, hypertension, GERD, OSA, paroxysmal A-fib, prostate cancer, type 2 diabetes mellitus, COPD, CKD 3 AA, PTSD, depression, anxiety, gout, chronic pain presented to the ED with worsening edema and shortness of breath for few months,.  He is on CHF meds including torsemide at baseline, reports compliance with this, has not followed up with cardiology in well over a year.  In the ED vitals stable, sodium 125, potassium 5.4, creatinine 2.3 up from baseline of 1.6, calcium of 10.7, hemoglobin 10.2, troponin negative, BNP 287, chest x-ray noted cardiomegaly and concern for pulmonary edema  Subjective: -Feels a little better today, breathing is improving  Assessment and Plan:  Acute on chronic diastolic CHF -Last echo 4/23 with EF 60-65%, indeterminate diastolic function and normal RV -Continue Lasix 80 Mg twice daily, improving on diuretics, urine output is inaccurate, weight down 14 LB  -Continue Jardiance, on low-dose Aldactone, may need to discontinue if kidney function worsens or potassium trends up -Carvedilol held with bradycardia -Repeat echo with EF 60-65%, mildly diminished RV, moderately enlarged -Una boots placed yesterday -Alcohol cessation counseling, dietitian consult -PT OT eval, patient is interested in short-term rehab if needed  Hyponatremia Hyperkalemia -Secondary to hypervolemia and diuretics, improving -Potassium is corrected, cut down Aldactone dose  AKI on CKD 3a -creatinine elevated 2.32 from baseline 1.6. -Likely cardiorenal, avoid hypotension hold amlodipine and hydralazine -Now stable around 1.9  EtOH abuse -Drinks 8 beers per day, previously drinking 18 beers a day -High risk of withdrawal, continue thiamine and multivitamin, no evidence of withdrawal thus far    Hypertension -Holding amlodipine, hydralazine and carvedilol   Hyperlipidemia - Continue home atorvastatin   GERD - Continue home PPI   Obesity - Noted   OSA - Continue home CPAP   Permanent A-fib -Mild bradycardia, hold Coreg - Continue Eliquis   History of prostate cancer - Noted   Diabetes - SSI   COPD -Continue Dulera, as needed albuterol   PTSD Depression Anxiety - Continue home Xanax   Gout - Holding home allopurinol   DVT prophylaxis:      Eliquis Code Status:              Full Family Communication:       None on admission  Disposition Plan: Likely 2 to 3 days, may need rehab  Consultants:    Procedures:   Antimicrobials:    Objective: Vitals:   12/27/22 0525 12/27/22 0720 12/27/22 0740 12/27/22 1132  BP: (!) 116/56 (!) 108/57  (!) 110/95  Pulse:  65  65  Resp: 17 18  18   Temp:  98.2 F (36.8 C)  98 F (36.7 C)  TempSrc: Oral Oral  Oral  SpO2:  98% 97% 92%  Weight: 135 kg     Height:       No intake or output data in the 24 hours ending 12/27/22 1226  Filed Weights   12/25/22 0417 12/26/22 0521 12/27/22 0525  Weight: (!) 138 kg (!) 136.9 kg 135 kg    Examination:  General exam: Obese chronically ill male sitting up in recliner, AAOx3 HEENT: Neck obese unable to assess JVD CVS: S1-S2, irregularly irregular rhythm Lungs: Distant breath sounds, improving air movement Abdomen: Soft, nontender, bowel sounds present Extremities: 2+ edema, Unna boots on Skin: Skin changes in lower legs Psychiatry:  Mood & affect  appropriate.     Data Reviewed:   CBC: Recent Labs  Lab 12/24/22 0414 12/25/22 0558 12/26/22 0451  WBC 5.9 6.6 6.1  NEUTROABS 4.1  --   --   HGB 10.2* 11.1* 10.2*  HCT 30.3* 33.3* 31.2*  MCV 92.7 93.8 93.4  PLT 140* 159 151   Basic Metabolic Panel: Recent Labs  Lab 12/24/22 1454 12/24/22 2140 12/25/22 0558 12/26/22 0451 12/27/22 0334  NA 128* 127* 127* 129* 131*  K 5.2* 5.4* 4.9 4.7 4.6  CL 96* 94*  94* 94* 96*  CO2 22 22 21* 24 26  GLUCOSE 115* 126* 116* 124* 127*  BUN 62* 64* 64* 63* 60*  CREATININE 2.07* 1.99* 1.98* 1.95* 1.95*  CALCIUM 10.9* 10.6* 10.8* 10.4* 10.6*  MG 2.5*  --   --   --   --    GFR: Estimated Creatinine Clearance: 46.7 mL/min (A) (by C-G formula based on SCr of 1.95 mg/dL (H)). Liver Function Tests: Recent Labs  Lab 12/24/22 0414  AST 17  ALT 18  ALKPHOS 85  BILITOT 0.6  PROT 6.3*  ALBUMIN 4.2   No results for input(s): "LIPASE", "AMYLASE" in the last 168 hours. No results for input(s): "AMMONIA" in the last 168 hours. Coagulation Profile: No results for input(s): "INR", "PROTIME" in the last 168 hours. Cardiac Enzymes: No results for input(s): "CKTOTAL", "CKMB", "CKMBINDEX", "TROPONINI" in the last 168 hours. BNP (last 3 results) No results for input(s): "PROBNP" in the last 8760 hours. HbA1C: No results for input(s): "HGBA1C" in the last 72 hours. CBG: Recent Labs  Lab 12/26/22 1142 12/26/22 1605 12/26/22 2114 12/27/22 0719 12/27/22 1144  GLUCAP 132* 145* 125* 121* 108*   Lipid Profile: No results for input(s): "CHOL", "HDL", "LDLCALC", "TRIG", "CHOLHDL", "LDLDIRECT" in the last 72 hours. Thyroid Function Tests: No results for input(s): "TSH", "T4TOTAL", "FREET4", "T3FREE", "THYROIDAB" in the last 72 hours. Anemia Panel: No results for input(s): "VITAMINB12", "FOLATE", "FERRITIN", "TIBC", "IRON", "RETICCTPCT" in the last 72 hours. Urine analysis:    Component Value Date/Time   COLORURINE COLORLESS (A) 12/24/2022 0628   APPEARANCEUR CLEAR 12/24/2022 0628   LABSPEC 1.007 12/24/2022 0628   PHURINE 5.5 12/24/2022 0628   GLUCOSEU NEGATIVE 12/24/2022 0628   HGBUR NEGATIVE 12/24/2022 0628   BILIRUBINUR NEGATIVE 12/24/2022 0628   KETONESUR NEGATIVE 12/24/2022 0628   PROTEINUR NEGATIVE 12/24/2022 0628   UROBILINOGEN 0.2 03/24/2013 1255   NITRITE NEGATIVE 12/24/2022 0628   LEUKOCYTESUR NEGATIVE 12/24/2022 0628   Sepsis  Labs: @LABRCNTIP (procalcitonin:4,lacticidven:4)  ) Recent Results (from the past 240 hour(s))  MRSA Next Gen by PCR, Nasal     Status: Abnormal   Collection Time: 12/24/22  1:53 PM   Specimen: Nasal Mucosa; Nasal Swab  Result Value Ref Range Status   MRSA by PCR Next Gen DETECTED (A) NOT DETECTED Final    Comment: CRITICAL RESULT CALLED TO, READ BACK BY AND VERIFIED WITH: RN Donia Ast [phone] [id number] BY J RAZZAK, MT (NOTE) The GeneXpert MRSA Assay (FDA approved for NASAL specimens only), is one component of a comprehensive MRSA colonization surveillance program. It is not intended to diagnose MRSA infection nor to guide or monitor treatment for MRSA infections. Test performance is not FDA approved in patients less than 79 years old. Performed at [facility], [address] [place], [place] [address]      Radiology Studies: No results found.   Scheduled Meds:  ALPRAZolam  1 mg Oral QHS   apixaban  5 mg Oral BID  atorvastatin  40 mg Oral Q supper   Chlorhexidine Gluconate Cloth  6 each Topical Q0600   empagliflozin  10 mg Oral Daily   furosemide  80 mg Intravenous BID   gabapentin  300 mg Oral TID   insulin aspart  0-9 Units Subcutaneous TID WC   mometasone-formoterol  2 puff Inhalation BID   multivitamin with minerals  1 tablet Oral Daily   mupirocin ointment  1 Application Nasal BID   pantoprazole  40 mg Oral Daily   sodium chloride flush  3 mL Intravenous Q12H   spironolactone  12.5 mg Oral q morning   thiamine  100 mg Oral Daily   umeclidinium bromide  1 puff Inhalation Daily   Continuous Infusions:   LOS: 2 days    Time spent:    Zannie Cove, MD [facility]   12/27/2022, 12:26 PM

## 2022-12-27 NOTE — Plan of Care (Signed)

## 2022-12-27 NOTE — Plan of Care (Signed)
  Problem: Health Behavior/Discharge Planning: Goal: Ability to manage health-related needs will improve Outcome: Progressing   Problem: Clinical Measurements: Goal: Ability to maintain clinical measurements within normal limits will improve Outcome: Progressing Goal: Respiratory complications will improve Outcome: Progressing Goal: Cardiovascular complication will be avoided Outcome: Progressing   Problem: Activity: Goal: Risk for activity intolerance will decrease Outcome: Progressing   Problem: Nutrition: Goal: Adequate nutrition will be maintained Outcome: Progressing   Problem: Coping: Goal: Level of anxiety will decrease Outcome: Progressing   Problem: Safety: Goal: Ability to remain free from injury will improve Outcome: Progressing   Problem: Skin Integrity: Goal: Risk for impaired skin integrity will decrease Outcome: Progressing

## 2022-12-27 NOTE — Progress Notes (Signed)
   12/27/22 2257  BiPAP/CPAP/SIPAP  BiPAP/CPAP/SIPAP Pt Type Adult  BiPAP/CPAP/SIPAP Resmed  Mask Type Full face mask  Mask Size Large  Respiratory Rate 18 breaths/min  EPAP 14 cmH2O  Patient Home Equipment No  Auto Titrate No  BiPAP/CPAP /SiPAP Vitals  Pulse Rate 63  Resp 18  SpO2 96 %  Bilateral Breath Sounds Clear;Diminished  MEWS Score/Color  MEWS Score 0  MEWS Score Color [place] Si

## 2022-12-27 NOTE — Progress Notes (Signed)
Mobility Specialist: Progress Note   12/27/22 1500  Mobility  Activity Ambulated with assistance in hallway  Level of Assistance Contact guard assist, steadying assist  Assistive Device Front wheel walker  Distance Ambulated (ft) 100 ft  Activity Response Tolerated well  Mobility Referral Yes  $Mobility charge 1 Mobility  Mobility Specialist Start Time (ACUTE ONLY) 1500  Mobility Specialist Stop Time (ACUTE ONLY) 1514  Mobility Specialist Time Calculation (min) (ACUTE ONLY) 14 min    Pt was agreeable to mobility session - received in bed. No c/o throughout. Observed slight unsteady gait; otherwise no LOB. At EOS, stated he was feeling a little "winded" but otherwise fine. Left in bed with all needs met, bed alarm on. Call bell in reach.   Maurene Capes Mobility Specialist Please contact via SecureChat or Rehab office at [phone]

## 2022-12-27 NOTE — Progress Notes (Addendum)
Physical Therapy Evaluation Patient Details Name: Francis Cardenas MRN: [id number] DOB: [birth year] Today's Date: 12/27/2022  History of Present Illness  Pt is a 72 y/o M presenting to ED On 8/3 with BLE edema and SOB. PMH includes HTN, HLD, COPD, sleep apnea, paroxysmal A fib on apabaxin, diastolic heart failure, GERD, OSA, morbid obesity, heavy alcohol use, prostate CA, DM2, COPD, CKD III, PTSD, depression, gout, anxiety  Clinical Impression  Pt was admitted for SOB and low endurance, limited tolerance for gait and edema for last few months.  Pt is anticipating a try for rehab placement, to increase safety and strength for longer walks with much reduction in fall risk.  He is appropriate for PT acutely with POC written and goals are outlined below.  Pt is home with help but has sustained so many falls that he is unlikely to feel safe until he has recovered his balance and strength.          If plan is discharge home, recommend the following: A little help with walking and/or transfers;A little help with bathing/dressing/bathroom;Assistance with cooking/housework;Help with stairs or ramp for entrance;Assist for transportation   Can travel by private vehicle   No    Equipment Recommendations Rollator (4 wheels)  Recommendations for Other Services       Functional Status Assessment Patient has had a recent decline in their functional status and demonstrates the ability to make significant improvements in function in a reasonable and predictable amount of time.     Precautions / Restrictions Precautions Precautions: Fall Precaution Comments: 6 falls since April 2024 Restrictions Weight Bearing Restrictions: No      Mobility  Bed Mobility               General bed mobility comments: OOB in chair upon arrival and departure    Transfers Overall transfer level: Needs assistance Equipment used: Rolling walker (2 wheels) Transfers: Sit to/from Stand Sit to Stand: Min guard            General transfer comment: min guard for safety but in practice is standing with supervision    Ambulation/Gait Ambulation/Gait assistance: Min guard, Min assist Gait Distance (Feet): 100 Feet (with four stops on RW) Assistive device: Rolling walker (2 wheels) Gait Pattern/deviations: Step-through pattern, Decreased stride length, Wide base of support, Drifts right/left Gait velocity: reduced, stops frequently Gait velocity interpretation: <1.31 ft/sec, indicative of household ambulator Pre-gait activities: standing balance ck General Gait Details: lateral instability with turns and esp to come out of the room to go down the hall  Stairs            Wheelchair Mobility     Tilt Bed    Modified Rankin (Stroke Patients Only)       Balance Overall balance assessment: Needs assistance Sitting-balance support: Feet supported Sitting balance-Leahy Scale: Good     Standing balance support: Bilateral upper extremity supported, During functional activity Standing balance-Leahy Scale: Fair Standing balance comment: less than fair dynamically                             Pertinent Vitals/Pain Pain Assessment Pain Assessment: Faces Faces Pain Scale: No hurt    Home Living Family/patient expects to be discharged to:: Private residence Living Arrangements: Spouse/significant other Available Help at Discharge: Family;Available PRN/intermittently Type of Home: House Home Access: Ramped entrance       Home Layout: One level Home Equipment: Rollator (4 wheels);Shower seat;Grab bars -  tub/shower;Electric scooter Additional Comments: pt is inconsistent about home information between PT and OT    Prior Function Prior Level of Function : Independent/Modified Independent             Mobility Comments: Uses rollator periodically in the house       Hand Dominance   Dominant Hand: Right    Extremity/Trunk Assessment   Upper Extremity  Assessment Upper Extremity Assessment: Defer to OT evaluation            Communication   Communication: No difficulties  Cognition Arousal/Alertness: Awake/alert Behavior During Therapy: WFL for tasks assessed/performed Overall Cognitive Status: Within Functional Limits for tasks assessed                                 General Comments: tangential in conversation        General Comments General comments (skin integrity, edema, etc.): Pt is up to walk with ability to assist himself until moving through turns, then requires contact with steadying influence    Exercises     Assessment/Plan    PT Assessment Patient needs continued PT services  PT Problem List Decreased strength;Decreased balance;Decreased mobility;Decreased coordination;Decreased knowledge of use of DME;Decreased safety awareness;Cardiopulmonary status limiting activity       PT Treatment Interventions DME instruction;Gait training;Functional mobility training;Therapeutic activities;Therapeutic exercise;Balance training;Neuromuscular re-education;Patient/family education    PT Goals (Current goals can be found in the Care Plan section)  Acute Rehab PT Goals Patient Stated Goal: to walk and get home PT Goal Formulation: With patient Time For Goal Achievement: 01/10/23 Potential to Achieve Goals: Good    Frequency Min 1X/week     Co-evaluation               AM-PAC PT "6 Clicks" Mobility  Outcome Measure Help needed turning from your back to your side while in a flat bed without using bedrails?: A Little Help needed moving from lying on your back to sitting on the side of a flat bed without using bedrails?: A Little Help needed moving to and from a bed to a chair (including a wheelchair)?: A Little Help needed standing up from a chair using your arms (e.g., wheelchair or bedside chair)?: A Little Help needed to walk in hospital room?: A Little Help needed climbing 3-5 steps with a  railing? : A Lot 6 Click Score: 17    End of Session Equipment Utilized During Treatment: Gait belt Activity Tolerance: Treatment limited secondary to medical complications (Comment) (SOB but controlled sats) Patient left: in chair;with call bell/phone within reach;with chair alarm set Nurse Communication: Mobility status PT Visit Diagnosis: Unsteadiness on feet (R26.81);Muscle weakness (generalized) (M62.81);Difficulty in walking, not elsewhere classified (R26.2)    Time: 9562-1308 PT Time Calculation (min) (ACUTE ONLY): 39 min   Charges:   PT Evaluation $PT Eval Moderate Complexity: 1 Mod PT Treatments $Gait Training: 8-22 mins $Therapeutic Activity: 8-22 mins PT General Charges $$ ACUTE PT VISIT: 1 Visit        Ivar Drape 12/27/2022, 3:05 PM  Samul Dada, PT PhD Acute Rehab Dept. Number: [facility] [id number] and [facility] [phone]

## 2022-12-28 DIAGNOSIS — I5033 Acute on chronic diastolic (congestive) heart failure: Secondary | ICD-10-CM | POA: Diagnosis not present

## 2022-12-28 LAB — GLUCOSE, CAPILLARY
Glucose-Capillary: 171 mg/dL — ABNORMAL HIGH (ref 70–99)
Glucose-Capillary: 113 mg/dL — ABNORMAL HIGH (ref 70–99)
Glucose-Capillary: 128 mg/dL — ABNORMAL HIGH (ref 70–99)
Glucose-Capillary: 114 mg/dL — ABNORMAL HIGH (ref 70–99)

## 2022-12-28 LAB — BASIC METABOLIC PANEL
Anion gap: 14 (ref 5–15)
BUN: 58 mg/dL — ABNORMAL HIGH (ref 8–23)
CO2: 23 mmol/L (ref 22–32)
Calcium: 11 mg/dL — ABNORMAL HIGH (ref 8.9–10.3)
Chloride: 98 mmol/L (ref 98–111)
Creatinine, Ser: 1.76 mg/dL — ABNORMAL HIGH (ref 0.61–1.24)
GFR, Estimated: 40 mL/min — ABNORMAL LOW (ref >60)
Glucose, Bld: 153 mg/dL — ABNORMAL HIGH (ref 70–99)
Potassium: 4.3 mmol/L (ref 3.5–5.1)
Sodium: 135 mmol/L (ref 135–145)

## 2022-12-28 MED ORDER — ALLOPURINOL 100 MG PO TABS
100.0000 mg | ORAL_TABLET | Freq: Every day | ORAL | Status: DC
  Administered 2022-12-28 – 2023-01-01 (×5): 100 mg via ORAL
  Filled 2022-12-28 (×5): qty 1

## 2022-12-28 MED ORDER — SPIRONOLACTONE 25 MG PO TABS
25.0000 mg | ORAL_TABLET | Freq: Every morning | ORAL | Status: DC
  Administered 2022-12-29 – 2023-01-01 (×4): 25 mg via ORAL
  Filled 2022-12-28 (×4): qty 1

## 2022-12-28 NOTE — Progress Notes (Signed)
   12/28/22 1656  Mobility  Activity Ambulated with assistance in hallway  Level of Assistance Contact guard assist, steadying assist  Assistive Device None  Distance Ambulated (ft) 400 ft  Activity Response Tolerated well  Mobility Referral Yes  $Mobility charge 1 Mobility  Mobility Specialist Start Time (ACUTE ONLY) 1525  Mobility Specialist Stop Time (ACUTE ONLY) 1540  Mobility Specialist Time Calculation (min) (ACUTE ONLY) 15 min   Mobility Specialist: Progress Note  Post mobility HR 80s  Pt received in chair. During ambulation, pt c/o SOB and lightheadedness, took two breaks (<10 sec), encouraged pursed lip breathing. Pt returned to chair with all needs met. Call bell within reach.   Barnie Mort Mobility Specialist Please contact via SecureChat or Rehab office at [phone]

## 2022-12-28 NOTE — Progress Notes (Signed)
PROGRESS NOTE    Francis Cardenas  OZH:[id number] DOB: 1950-09-11 DOA: 12/24/2022 PCP: Benjiman Core, MD  71/M with history of chronic diastolic CHF, morbid obesity, heavy alcohol use, hypertension, GERD, OSA, paroxysmal A-fib, prostate cancer, type 2 diabetes mellitus, COPD, CKD 3 AA, PTSD, depression, anxiety, gout, chronic pain presented to the ED with worsening edema and shortness of breath for few months,.  He is on CHF meds including torsemide at baseline, reports compliance with this, has not followed up with cardiology in well over a year.  In the ED vitals stable, sodium 125, potassium 5.4, creatinine 2.3 up from baseline of 1.6, calcium of 10.7, hemoglobin 10.2, troponin negative, BNP 287, chest x-ray noted cardiomegaly and concern for pulmonary edema -Improving on diuretics, Unna boots placed  Subjective: -Feels better overall, weight down 19 LB  Assessment and Plan:  Acute on chronic diastolic CHF -Last echo 4/23 with EF 60-65%, indeterminate diastolic function and normal RV -Continue Lasix 80 Mg twice daily, improving on diuretics, urine output is inaccurate, weight down 19LB  -Continue Jardiance, creatinine improving, continue Aldactone increased dose to 25 Mg -Carvedilol held with bradycardia -Repeat echo with EF 60-65%, mildly diminished RV, moderately enlarged -Unna boots -Alcohol cessation counseling, dietitian consult -PT OT eval completed, SNF recommended, TOC consult  Hyponatremia Hyperkalemia -Secondary to hypervolemia and diuretics, improving  AKI on CKD 3a -creatinine elevated 2.32 from baseline 1.6. -Likely cardiorenal, avoid hypotension hold amlodipine and hydralazine -Improved pain, now down to 1.7 today  EtOH abuse -Drinks 8 beers per day, previously drinking 18 beers a day -High risk of withdrawal, continue thiamine and multivitamin, no evidence of withdrawal thus far   Hypertension -Holding amlodipine, hydralazine and carvedilol    Hyperlipidemia - Continue home atorvastatin   GERD - Continue home PPI   Obesity - Noted   OSA - Continue home CPAP   Permanent A-fib -Mild bradycardia, hold Coreg - Continue Eliquis   History of prostate cancer - Noted   Diabetes - SSI   COPD -Continue Dulera, as needed albuterol   PTSD Depression Anxiety - Continue home Xanax   Gout -Resume home allopurinol   DVT prophylaxis:      Eliquis Code Status:              Full Family Communication:       None on admission  Disposition Plan: SNF likely 48 hours  Consultants:    Procedures:   Antimicrobials:    Objective: Vitals:   12/27/22 2029 12/27/22 2257 12/28/22 0545 12/28/22 0818  BP: 122/61  (!) 115/56 (!) 111/48  Pulse: 69 63 64 71  Resp: 17 18 16 13   Temp: 97.7 F (36.5 C)  97.7 F (36.5 C)   TempSrc: Oral  Oral   SpO2: 96% 96% 91% 98%  Weight:   132.8 kg   Height:        Intake/Output Summary (Last 24 hours) at 12/28/2022 1209 Last data filed at 12/28/2022 0800 Gross per 24 hour  Intake 180 ml  Output 350 ml  Net -170 ml    Filed Weights   12/26/22 0521 12/27/22 0525 12/28/22 0545  Weight: (!) 136.9 kg 135 kg 132.8 kg    Examination:  General exam: Obese chronically ill male sitting up in the recliner, AAOx3 HEENT: Neck obese unable to assess JVD CVS: S1-S2, irregularly irregular rhythm Lungs: Distant breath sounds Abdomen: Soft, nontender, bowel sounds present Extremities: 1-2+ edema, Unna boots on Skin: Skin changes in lower legs Psychiatry:  Mood &  affect appropriate.     Data Reviewed:   CBC: Recent Labs  Lab 12/24/22 0414 12/25/22 0558 12/26/22 0451  WBC 5.9 6.6 6.1  NEUTROABS 4.1  --   --   HGB 10.2* 11.1* 10.2*  HCT 30.3* 33.3* 31.2*  MCV 92.7 93.8 93.4  PLT 140* 159 151   Basic Metabolic Panel: Recent Labs  Lab 12/24/22 1454 12/24/22 2140 12/25/22 0558 12/26/22 0451 12/27/22 0334 12/28/22 0747  NA 128* 127* 127* 129* 131* 135  K 5.2* 5.4* 4.9 4.7  4.6 4.3  CL 96* 94* 94* 94* 96* 98  CO2 22 22 21* 24 26 23   GLUCOSE 115* 126* 116* 124* 127* 153*  BUN 62* 64* 64* 63* 60* 58*  CREATININE 2.07* 1.99* 1.98* 1.95* 1.95* 1.76*  CALCIUM 10.9* 10.6* 10.8* 10.4* 10.6* 11.0*  MG 2.5*  --   --   --   --   --    GFR: Estimated Creatinine Clearance: 51.2 mL/min (A) (by C-G formula based on SCr of 1.76 mg/dL (H)). Liver Function Tests: Recent Labs  Lab 12/24/22 0414  AST 17  ALT 18  ALKPHOS 85  BILITOT 0.6  PROT 6.3*  ALBUMIN 4.2   No results for input(s): "LIPASE", "AMYLASE" in the last 168 hours. No results for input(s): "AMMONIA" in the last 168 hours. Coagulation Profile: No results for input(s): "INR", "PROTIME" in the last 168 hours. Cardiac Enzymes: No results for input(s): "CKTOTAL", "CKMB", "CKMBINDEX", "TROPONINI" in the last 168 hours. BNP (last 3 results) No results for input(s): "PROBNP" in the last 8760 hours. HbA1C: No results for input(s): "HGBA1C" in the last 72 hours. CBG: Recent Labs  Lab 12/27/22 1144 12/27/22 1606 12/27/22 2134 12/28/22 0748 12/28/22 1118  GLUCAP 108* 156* 138* 171* 113*   Lipid Profile: No results for input(s): "CHOL", "HDL", "LDLCALC", "TRIG", "CHOLHDL", "LDLDIRECT" in the last 72 hours. Thyroid Function Tests: No results for input(s): "TSH", "T4TOTAL", "FREET4", "T3FREE", "THYROIDAB" in the last 72 hours. Anemia Panel: No results for input(s): "VITAMINB12", "FOLATE", "FERRITIN", "TIBC", "IRON", "RETICCTPCT" in the last 72 hours. Urine analysis:    Component Value Date/Time   COLORURINE COLORLESS (A) 12/24/2022 0628   APPEARANCEUR CLEAR 12/24/2022 0628   LABSPEC 1.007 12/24/2022 0628   PHURINE 5.5 12/24/2022 0628   GLUCOSEU NEGATIVE 12/24/2022 0628   HGBUR NEGATIVE 12/24/2022 0628   BILIRUBINUR NEGATIVE 12/24/2022 0628   KETONESUR NEGATIVE 12/24/2022 0628   PROTEINUR NEGATIVE 12/24/2022 0628   UROBILINOGEN 0.2 03/24/2013 1255   NITRITE NEGATIVE 12/24/2022 0628   LEUKOCYTESUR  NEGATIVE 12/24/2022 0628   Sepsis Labs: @LABRCNTIP (procalcitonin:4,lacticidven:4)  ) Recent Results (from the past 240 hour(s))  MRSA Next Gen by PCR, Nasal     Status: Abnormal   Collection Time: 12/24/22  1:53 PM   Specimen: Nasal Mucosa; Nasal Swab  Result Value Ref Range Status   MRSA by PCR Next Gen DETECTED (A) NOT DETECTED Final    Comment: CRITICAL RESULT CALLED TO, READ BACK BY AND VERIFIED WITH: RN Donia Ast [phone] [id number] BY J RAZZAK, MT (NOTE) The GeneXpert MRSA Assay (FDA approved for NASAL specimens only), is one component of a comprehensive MRSA colonization surveillance program. It is not intended to diagnose MRSA infection nor to guide or monitor treatment for MRSA infections. Test performance is not FDA approved in patients less than 40 years old. Performed at [facility], [address], [place], [place] [address]      Radiology Studies: No results found.   Scheduled Meds:  ALPRAZolam  1 mg Oral QHS   apixaban  5 mg Oral BID   atorvastatin  40 mg Oral Q supper   Chlorhexidine Gluconate Cloth  6 each Topical Q0600   empagliflozin  10 mg Oral Daily   furosemide  80 mg Intravenous BID   gabapentin  300 mg Oral TID   insulin aspart  0-9 Units Subcutaneous TID WC   mometasone-formoterol  2 puff Inhalation BID   multivitamin with minerals  1 tablet Oral Daily   mupirocin ointment  1 Application Nasal BID   pantoprazole  40 mg Oral Daily   sodium chloride flush  3 mL Intravenous Q12H   spironolactone  12.5 mg Oral q morning   thiamine  100 mg Oral Daily   umeclidinium bromide  1 puff Inhalation Daily   Continuous Infusions:   LOS: 3 days    Time spent:    Zannie Cove, MD [facility]   12/28/2022, 12:09 PM

## 2022-12-28 NOTE — Progress Notes (Signed)
   Heart Failure Stewardship Pharmacist Progress Note   PCP: Benjiman Core, MD PCP-Cardiologist: Jodelle Red, MD    HPI:  72 yo M with PMH of CHF, afib, COPD, T2DM, HTN, HLD, OSA on CPAP, CKD III, obesity, prostate cancer, and alcohol dependence.   Presented to the ED on 8/3 with LE edema, shortness of breath, orthopnea, and chest pressure. CXR with cardiomegaly and pulmonary edema. BNP 287. ECHO 8/4 showed LVEF 60-65%, no RWMA, mild LVH, RV mildly reduced, mildly elevated PA pressure, trivial MR.   Current HF Medications: Diuretic: furosemide 80 mg IV BID MRA: spironolactone 12.5 mg daily SGLT2i: Jardiance 10 mg daily  Prior to admission HF Medications: Diuretic: torsemide 20 mg BID Beta blocker: metoprolol XL 12.5 mg daily MRA: spironolactone 25 mg daily SGLT2i: Jardiance 12.5 mg BID  Pertinent Lab Values: Serum creatinine 1.76, BUN 58, Potassium 4.3, Sodium 135, BNP 287.3, Magnesium 2.5   Vital Signs: Weight: 292 lbs (admission weight: 304 lbs) Blood pressure: 110/50s  Heart rate: 60-70s  I/O: incomplete   Medication Assistance / Insurance Benefits Check: Does the patient have prescription insurance?  Yes Type of insurance plan: [employer]  Outpatient Pharmacy:  Prior to admission outpatient pharmacy: [facility] Is the patient willing to use [facility] TOC pharmacy at discharge? Yes Is the patient willing to transition their outpatient pharmacy to utilize a [facility] outpatient pharmacy?   No    Assessment: 1. Acute on chronic diastolic CHF (LVEF 60-65%). NYHA class III symptoms. - Continue furosemide 80 mg IV BID. Still with LE edema and shortness of breath on exertion. Strict I/Os and daily weights. Keep K>4 and Mg>2. - Holding metoprolol with bradycardia on admission - No ACE/ARB/ARNI with AKI on CKD. Baseline appears ~1.6.  - Continue spironolactone 12.5 mg daily. Consider increasing to 25 mg daily when off IV lasix. Splitting tablets is difficult  for him to do with his carpal tunnel.  - Continue Jardiance 10 mg daily - was taking 12.5 mg BID prior to admission. Likely can consolidate to 25 mg once daily at discharge.   Plan: 1) Medication changes recommended at this time: - Change to Jardiance once daily at discharge - Continue IV diuresis - Increase spironolactone to 25 mg daily once off IV lasix  2) Patient assistance: - None - has [employer] benefits  3)  Education  - Patient has been educated on current HF medications and potential additions to HF medication regimen - Patient verbalizes understanding that over the next few months, these medication doses may change and more medications may be added to optimize HF regimen - Patient has been educated on basic disease state pathophysiology and goals of therapy   Sharen Hones, PharmD, BCPS Heart Failure Stewardship Pharmacist Phone [phone]

## 2022-12-28 NOTE — Plan of Care (Signed)
  Problem: Clinical Measurements: Goal: Ability to maintain clinical measurements within normal limits will improve Outcome: Progressing Goal: Will remain free from infection Outcome: Progressing Goal: Respiratory complications will improve Outcome: Progressing Goal: Cardiovascular complication will be avoided Outcome: Progressing   Problem: Nutrition: Goal: Adequate nutrition will be maintained Outcome: Progressing   Problem: Coping: Goal: Level of anxiety will decrease Outcome: Progressing   Problem: Safety: Goal: Ability to remain free from injury will improve Outcome: Progressing   Problem: Skin Integrity: Goal: Risk for impaired skin integrity will decrease Outcome: Progressing

## 2022-12-28 NOTE — NC FL2 (Signed)
[place] MEDICAID [facility] LEVEL OF CARE FORM     IDENTIFICATION  Patient Name: Francis Cardenas Birthdate: 1950-08-15 Sex: male Admission Date (Current Location): 12/24/2022  [facility] and [place] Number:  Producer, television/film/video and Address:  [facility], [address] [place], [place] [address]      Provider Number: [id number]  Attending Physician Name and Address:  Zannie Cove, MD  Relative Name and Phone Number:  Coy Saunas (daughter) [phone]    Current Level of Care: Hospital Recommended Level of Care: Skilled Nursing Facility Prior Approval Number:    Date Approved/Denied:   PASRR Number: [id number]  Discharge Plan: SNF    Current Diagnoses: Patient Active Problem List   Diagnosis Date Noted   Acute exacerbation of CHF (congestive heart failure) (HCC) 12/25/2022   Acute on chronic diastolic CHF (congestive heart failure) (HCC) 12/24/2022   Chronic kidney disease, stage 3 (HCC) 12/24/2022   Anxiety state 12/24/2022   BPH (benign prostatic hyperplasia) 09/14/2022   Ambulatory dysfunction 09/14/2022   Acute renal failure superimposed on stage 3a chronic kidney disease (HCC) 06/15/2022   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   Chronic gout of multiple sites 08/03/2021   Pressure injury of skin 05/15/2021   Prostate cancer (HCC) 05/14/2021   Persistent atrial fibrillation (HCC) 05/14/2021   Morbid obesity (HCC) 12/12/2018   (HFpEF) heart failure with preserved ejection fraction (HCC) 12/10/2018   Multiple rib fractures 12/10/2018   OSA (obstructive sleep apnea) 12/10/2018   Chest pain syndrome 12/30/2017   Carcinoma of prostate (HCC) 08/02/2016   COPD with asthma 08/02/2016   GERD (gastroesophageal reflux disease) 08/02/2016   Mixed hyperlipidemia 08/02/2016   Peripheral neuropathy 08/02/2016   Steatosis of liver 08/02/2016   Type 2 diabetes mellitus with obesity (HCC) 08/02/2016   Benign essential hypertension 06/11/2013   Chronic  pain due to trauma 06/11/2013   Alcohol dependence (HCC) 03/26/2013   PTSD (post-traumatic stress disorder) 03/26/2013   Depressive disorder 03/24/2013    Orientation RESPIRATION BLADDER Height & Weight     Self, Time, Situation, Place  Normal Continent Weight: 292 lb 12.8 oz (132.8 kg) Height:  5\' 10"  (177.8 cm)  BEHAVIORAL SYMPTOMS/MOOD NEUROLOGICAL BOWEL NUTRITION STATUS      Continent Diet (Please see discharge summary)  AMBULATORY STATUS COMMUNICATION OF NEEDS Skin   Limited Assist Verbally Other (Comment) (Ecchymosis,arm,Bil.,Erythema,Leg,Arm,Back,Abdomen,Bil.,Lower,Upper,Weeping,Leg,Bil.,Gauze,Wound/Inc. LDAs)                       Personal Care Assistance Level of Assistance  Feeding, Bathing, Dressing Bathing Assistance: Limited assistance Feeding assistance: Independent Dressing Assistance: Limited assistance     Functional Limitations Info  Sight, Hearing, Speech Sight Info: Adequate Hearing Info: Impaired Speech Info: Adequate    SPECIAL CARE FACTORS FREQUENCY  PT (By licensed PT), OT (By licensed OT)     PT Frequency: 5x min weekly OT Frequency: 5x min weekly            Contractures Contractures Info: Not present    Additional Factors Info  Code Status, Allergies, Psychotropic, Insulin Sliding Scale Code Status Info: DNR Allergies Info: Oxcarbazepine,Zolpidem Psychotropic Info: ALPRAZolam (XANAX) tablet 1 mg daily at bedtime Insulin Sliding Scale Info: insulin aspart (novoLOG) injection 0-9 Units 3 times daily with meals       Current Medications (12/28/2022):  This is the current hospital active medication list Current Facility-Administered Medications  Medication Dose Route Frequency Provider Last Rate Last Admin   acetaminophen (TYLENOL) tablet 650  mg  650 mg Oral Q6H PRN Synetta Fail, MD   650 mg at 12/24/22 1954   Or   acetaminophen (TYLENOL) suppository 650 mg  650 mg Rectal Q6H PRN Synetta Fail, MD       albuterol  (PROVENTIL) (2.5 MG/3ML) 0.083% nebulizer solution 2.5 mg  2.5 mg Nebulization Q6H PRN Synetta Fail, MD       allopurinol (ZYLOPRIM) tablet 100 mg  100 mg Oral Daily Zannie Cove, MD   100 mg at 12/28/22 1344   ALPRAZolam (XANAX) tablet 1 mg  1 mg Oral QHS Synetta Fail, MD   1 mg at 12/27/22 2119   apixaban (ELIQUIS) tablet 5 mg  5 mg Oral BID Synetta Fail, MD   5 mg at 12/28/22 1610   atorvastatin (LIPITOR) tablet 40 mg  40 mg Oral Q supper Synetta Fail, MD   40 mg at 12/27/22 1705   Chlorhexidine Gluconate Cloth 2 % PADS 6 each  6 each Topical Q0600 Synetta Fail, MD   6 each at 12/28/22 9604   empagliflozin (JARDIANCE) tablet 10 mg  10 mg Oral Daily Zannie Cove, MD   10 mg at 12/28/22 5409   furosemide (LASIX) injection 80 mg  80 mg Intravenous BID Zannie Cove, MD   80 mg at 12/28/22 8119   gabapentin (NEURONTIN) capsule 300 mg  300 mg Oral TID Synetta Fail, MD   300 mg at 12/28/22 1478   hydrocortisone cream 1 %   Topical PRN Zannie Cove, MD   Given at 12/26/22 2027   insulin aspart (novoLOG) injection 0-9 Units  0-9 Units Subcutaneous TID [facility] Synetta Fail, MD   2 Units at 12/28/22 2956   mometasone-formoterol (DULERA) 200-5 MCG/ACT inhaler 2 puff  2 puff Inhalation BID Synetta Fail, MD   2 puff at 12/28/22 0750   multivitamin with minerals tablet 1 tablet  1 tablet Oral Daily Zannie Cove, MD   1 tablet at 12/28/22 2130   mupirocin ointment (BACTROBAN) 2 % 1 Application  1 Application Nasal BID Synetta Fail, MD   1 Application at 12/28/22 8657   Oral care mouth rinse  15 mL Mouth Rinse PRN Zannie Cove, MD       pantoprazole (PROTONIX) EC tablet 40 mg  40 mg Oral Daily Synetta Fail, MD   40 mg at 12/28/22 8469   polyethylene glycol (MIRALAX / GLYCOLAX) packet 17 g  17 g Oral Daily PRN Synetta Fail, MD       sodium chloride flush (NS) 0.9 % injection 3 mL  3 mL Intravenous Q12H Synetta Fail, MD    3 mL at 12/28/22 0924   [START ON 12/29/2022] spironolactone (ALDACTONE) tablet 25 mg  25 mg Oral q morning Zannie Cove, MD       thiamine (VITAMIN B1) tablet 100 mg  100 mg Oral Daily Zannie Cove, MD   100 mg at 12/28/22 6295   umeclidinium bromide (INCRUSE ELLIPTA) 62.5 MCG/ACT 1 puff  1 puff Inhalation Daily Synetta Fail, MD   1 puff at 12/28/22 2841     Discharge Medications: Please see discharge summary for a list of discharge medications.  Relevant Imaging Results:  Relevant Lab Results:   Additional Information [id number]  Delilah Shan, LCSWA

## 2022-12-28 NOTE — Progress Notes (Signed)
Occupational Therapy Treatment Patient Details Name: Francis Cardenas MRN: [id number] DOB: 10-Sep-1950 Today's Date: 12/28/2022   History of present illness Pt is a 72 y/o M presenting to ED On 8/3 with BLE edema and SOB. PMH includes HTN, HLD, COPD, sleep apnea, paroxysmal A fib on apabaxin, diastolic heart failure, GERD, OSA, morbid obesity, heavy alcohol use, prostate CA, DM2, COPD, CKD III, PTSD, depression, gout, anxiety   OT comments  Pt seen for practice with use of AE for LB ADLs, pt with bil legs wrapped in unna boots, needing min A for use of sock aid and reacher, pt states he has reacher at home, demo's technique for using reacher for donning pants/undergarments and pt verbalized understanding. Pt very tangential with conversation, needs cues to remain on task during session. Pt presenting with impairments listed below, will follow acutely. Patient will benefit from continued inpatient follow up therapy, <3 hours/day to maximize safety/ind with ADLs/functional mobility.       If plan is discharge home, recommend the following:  A lot of help with bathing/dressing/bathroom;Assistance with cooking/housework;Help with stairs or ramp for entrance;Assist for transportation   Equipment Recommendations  Other (comment) (defer)    Recommendations for Other Services PT consult    Precautions / Restrictions Precautions Precautions: Fall Precaution Comments: 6 falls since April 2024 Restrictions Weight Bearing Restrictions: No       Mobility Bed Mobility               General bed mobility comments: OOB in chair upon arrival and departure    Transfers                   General transfer comment: focus on LE AE     Balance Overall balance assessment: Needs assistance Sitting-balance support: Feet supported Sitting balance-Leahy Scale: Good Sitting balance - Comments: sits unsupported in chair during session                                   ADL  either performed or assessed with clinical judgement   ADL Overall ADL's : Needs assistance/impaired                     Lower Body Dressing: Minimal assistance;With adaptive equipment;Sitting/lateral leans Lower Body Dressing Details (indicate cue type and reason): with use of AE                    Extremity/Trunk Assessment Upper Extremity Assessment Upper Extremity Assessment: Generalized weakness   Lower Extremity Assessment Lower Extremity Assessment: Defer to PT evaluation        Vision   Vision Assessment?: No apparent visual deficits   Perception Perception Perception: Not tested   Praxis Praxis Praxis: Not tested    Cognition Arousal: Alert Behavior During Therapy: [facility] for tasks assessed/performed Overall Cognitive Status: Within Functional Limits for tasks assessed                                 General Comments: tangential in conversation        Exercises      Shoulder Instructions       General Comments VSS on RA    Pertinent Vitals/ Pain       Pain Assessment Pain Assessment: No/denies pain  Home Living  Prior Functioning/Environment              Frequency  Min 1X/week        Progress Toward Goals  OT Goals(current goals can now be found in the care plan section)  Progress towards OT goals: Progressing toward goals  Acute Rehab OT Goals Patient Stated Goal: none stated OT Goal Formulation: With patient Time For Goal Achievement: 01/09/23 Potential to Achieve Goals: Good ADL Goals Pt Will Perform Upper Body Dressing: with contact guard assist Pt Will Perform Lower Body Dressing: with contact guard assist Pt Will Transfer to Toilet: with modified independence;ambulating;regular height toilet Pt/caregiver will Perform Home Exercise Program: Both right and left upper extremity;With written HEP provided;Increased ROM;Increased strength;With  Supervision Additional ADL Goal #1: pt will verbalize x3 energy conservation strategies in prep for ADLs  Plan      Co-evaluation                 AM-PAC OT "6 Clicks" Daily Activity     Outcome Measure   Help from another person eating meals?: None Help from another person taking care of personal grooming?: A Little Help from another person toileting, which includes using toliet, bedpan, or urinal?: A Little Help from another person bathing (including washing, rinsing, drying)?: A Lot Help from another person to put on and taking off regular upper body clothing?: A Little Help from another person to put on and taking off regular lower body clothing?: A Lot 6 Click Score: 17    End of Session    OT Visit Diagnosis: Unsteadiness on feet (R26.81);Other abnormalities of gait and mobility (R26.89);Muscle weakness (generalized) (M62.81);History of falling (Z91.81)   Activity Tolerance Patient tolerated treatment well   Patient Left with call bell/phone within reach;in chair   Nurse Communication Mobility status        Time: 1610-9604 OT Time Calculation (min): 36 min  Charges: OT General Charges $OT Visit: 1 Visit OT Treatments $Self Care/Home Management : 23-37 mins  Francis Fila, OTD, OTR/L SecureChat Preferred Acute Rehab [phone]   Francis Cardenas 12/28/2022, 1:47 PM

## 2022-12-28 NOTE — TOC Initial Note (Addendum)
Transition of Care [facility]) - Initial/Assessment Note    Patient Details  Name: Francis Cardenas MRN: [id number] Date of Birth: 12-May-1951  Transition of Care [facility]) CM/SW Contact:    Delilah Shan, LCSWA Phone Number: 12/28/2022, 10:32 AM  Clinical Narrative:                  CSW received consult for possible SNF placement at time of discharge. CSW spoke with patient regarding PT recommendation of SNF placement at time of discharge. Patient reports PTA he comes from home with spouse.Patient reported that patient's spouse is currently unable to care for patient at their home given patient's current physical needs and fall risk. Patient expressed understanding of PT recommendation and is agreeable to SNF placement at time of discharge. Patient gave CSW permission to fax out referral for SNF placement.CSW discussed insurance authorization process and will provide Medicare compare SNF ratings list with accepted SNF bed offers when available. Patient gave permission to use his medicare benefits for rehab.Patient expressed being hopeful for rehab and to feel better soon. Patient reports his spouse can bring his cpap from home to facility.No further questions reported at this time. CSW to continue to follow and assist with discharge planning needs.   Expected Discharge Plan: Skilled Nursing Facility Barriers to Discharge: Continued Medical Work up   Patient Goals and CMS Choice Patient states their goals for this hospitalization and ongoing recovery are:: SNF   Choice offered to / list presented to : Patient      Expected Discharge Plan and Services In-house Referral: Clinical Social Work     Living arrangements for the past 2 months: Single Family Home                                      Prior Living Arrangements/Services Living arrangements for the past 2 months: Single Family Home Lives with:: Spouse Patient language and need for interpreter reviewed:: Yes Do you feel safe  going back to the place where you live?: No   SNF  Need for Family Participation in Patient Care: Yes (Comment) Care giver support system in place?: Yes (comment)   Criminal Activity/Legal Involvement Pertinent to Current Situation/Hospitalization: No - Comment as needed  Activities of Daily Living Home Assistive Devices/Equipment: Walker (specify type) ADL Screening (condition at time of admission) Patient's cognitive ability adequate to safely complete daily activities?: Yes Is the patient deaf or have difficulty hearing?: Yes Does the patient have difficulty seeing, even when wearing glasses/contacts?: No Does the patient have difficulty concentrating, remembering, or making decisions?: No Patient able to express need for assistance with ADLs?: Yes Does the patient have difficulty dressing or bathing?: Yes Independently performs ADLs?: No Communication: Independent Dressing (OT): Needs assistance Is this a change from baseline?: Change from baseline, expected to last >3 days Grooming: Independent Feeding: Independent Bathing: Needs assistance Is this a change from baseline?: Change from baseline, expected to last >3 days Toileting: Needs assistance Is this a change from baseline?: Change from baseline, expected to last >3days In/Out Bed: Needs assistance Is this a change from baseline?: Change from baseline, expected to last >3 days Walks in Home: Independent Does the patient have difficulty walking or climbing stairs?: Yes Weakness of Legs: Both Weakness of Arms/Hands: None  Permission Sought/Granted Permission sought to share information with : Case Manager, Family Supports, Magazine features editor Permission granted to share information with : Yes,  Verbal Permission Granted  Share Information with NAME: Coy Saunas  Permission granted to share info w AGENCY: SNF  Permission granted to share info w Relationship: spouse  Permission granted to share info w Contact  Information: Coy Saunas [phone]  Emotional Assessment Appearance:: Appears stated age Attitude/Demeanor/Rapport: Gracious Affect (typically observed): Calm Orientation: : Oriented to Self, Oriented to Place, Oriented to  Time, Oriented to Situation Alcohol / Substance Use: Not Applicable Psych Involvement: No (comment)  Admission diagnosis:  Acute on chronic diastolic heart failure (HCC) [I50.33] Hyponatremia [E87.1] Thrombocytopenia (HCC) [D69.6] Chronic anticoagulation [Z79.01] Acute kidney injury (nontraumatic) (HCC) [N17.9] Acute CHF (congestive heart failure) (HCC) [I50.9] Normochromic normocytic anemia [D64.9] Atrial fibrillation, unspecified type (HCC) [I48.91] Acute exacerbation of CHF (congestive heart failure) (HCC) [I50.9] Patient Active Problem List   Diagnosis Date Noted   Acute exacerbation of CHF (congestive heart failure) (HCC) 12/25/2022   Acute on chronic diastolic CHF (congestive heart failure) (HCC) 12/24/2022   Chronic kidney disease, stage 3 (HCC) 12/24/2022   Anxiety state 12/24/2022   BPH (benign prostatic hyperplasia) 09/14/2022   Ambulatory dysfunction 09/14/2022   Acute renal failure superimposed on stage 3a chronic kidney disease (HCC) 06/15/2022   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   Chronic gout of multiple sites 08/03/2021   Pressure injury of skin 05/15/2021   Prostate cancer (HCC) 05/14/2021   Persistent atrial fibrillation (HCC) 05/14/2021   Morbid obesity (HCC) 12/12/2018   (HFpEF) heart failure with preserved ejection fraction (HCC) 12/10/2018   Multiple rib fractures 12/10/2018   OSA (obstructive sleep apnea) 12/10/2018   Chest pain syndrome 12/30/2017   Carcinoma of prostate (HCC) 08/02/2016   COPD with asthma 08/02/2016   GERD (gastroesophageal reflux disease) 08/02/2016   Mixed hyperlipidemia 08/02/2016   Peripheral neuropathy 08/02/2016   Steatosis of liver 08/02/2016   Type 2 diabetes mellitus with obesity (HCC) 08/02/2016    Benign essential hypertension 06/11/2013   Chronic pain due to trauma 06/11/2013   Alcohol dependence (HCC) 03/26/2013   PTSD (post-traumatic stress disorder) 03/26/2013   Depressive disorder 03/24/2013   PCP:  Benjiman Core, MD Pharmacy:   [facility] - [place], [place] - [address] [place] [address] Phone: [phone] Fax: [phone]  [id number] - [place], [place] - [address] [facility] [address] [place] [address] Phone: [phone] Fax: [phone]  [facility] [address] [place] [address] Phone: [phone] Fax: [phone]  [facility] [address] [place] [address] Phone: [phone] Fax: [phone]     Social Determinants of Health (SDOH) Social History: SDOH Screenings   Food Insecurity: No Food Insecurity (12/26/2022)  Housing: Low Risk  (12/26/2022)  Transportation Needs: No Transportation Needs (12/24/2022)  Utilities: Not At Risk (12/24/2022)  Alcohol Screen: Low Risk  (12/26/2022)  Financial Resource Strain: Low Risk  (12/26/2022)  Tobacco Use: Medium Risk (12/24/2022)   SDOH Interventions: Food Insecurity Interventions: Intervention Not Indicated Housing Interventions: Intervention Not Indicated Financial Strain Interventions: Intervention Not Indicated   Readmission Risk Interventions    09/02/2021   11:32 AM  Readmission Risk Prevention Plan  Transportation Screening Complete  PCP or Specialist Appt within 3-5 Days --  HRI or Home Care Consult Complete  Palliative Care Screening Not Applicable  Medication Review (RN Care Manager) Complete

## 2022-12-29 DIAGNOSIS — I5033 Acute on chronic diastolic (congestive) heart failure: Secondary | ICD-10-CM | POA: Diagnosis not present

## 2022-12-29 LAB — BASIC METABOLIC PANEL WITH GFR
Anion gap: 12 (ref 5–15)
BUN: 59 mg/dL — ABNORMAL HIGH (ref 8–23)
CO2: 25 mmol/L (ref 22–32)
Calcium: 10.8 mg/dL — ABNORMAL HIGH (ref 8.9–10.3)
Chloride: 98 mmol/L (ref 98–111)
Creatinine, Ser: 1.68 mg/dL — ABNORMAL HIGH (ref 0.61–1.24)
GFR, Estimated: 43 mL/min — ABNORMAL LOW
Glucose, Bld: 124 mg/dL — ABNORMAL HIGH (ref 70–99)
Potassium: 3.9 mmol/L (ref 3.5–5.1)
Sodium: 135 mmol/L (ref 135–145)

## 2022-12-29 LAB — GLUCOSE, CAPILLARY
Glucose-Capillary: 113 mg/dL — ABNORMAL HIGH (ref 70–99)
Glucose-Capillary: 135 mg/dL — ABNORMAL HIGH (ref 70–99)
Glucose-Capillary: 146 mg/dL — ABNORMAL HIGH (ref 70–99)
Glucose-Capillary: 133 mg/dL — ABNORMAL HIGH (ref 70–99)

## 2022-12-29 MED ORDER — FUROSEMIDE 10 MG/ML IJ SOLN
40.0000 mg | Freq: Two times a day (BID) | INTRAMUSCULAR | Status: AC
  Administered 2022-12-29: 40 mg via INTRAVENOUS
  Filled 2022-12-29: qty 4

## 2022-12-29 NOTE — Progress Notes (Signed)
PROGRESS NOTE    Francis Cardenas  NGE:[id number] DOB: [birth year] DOA: 12/24/2022 PCP: Benjiman Core, MD  71/M with history of chronic diastolic CHF, morbid obesity, heavy alcohol use, hypertension, GERD, OSA, paroxysmal A-fib, prostate cancer, type 2 diabetes mellitus, COPD, CKD 3 AA, PTSD, depression, anxiety, gout, chronic pain presented to the ED with worsening edema and shortness of breath for few months,.  He is on CHF meds including torsemide at baseline, reports compliance with this, has not followed up with cardiology in well over a year.  In the ED vitals stable, sodium 125, potassium 5.4, creatinine 2.3 up from baseline of 1.6, calcium of 10.7, hemoglobin 10.2, troponin negative, BNP 287, chest x-ray noted cardiomegaly and concern for pulmonary edema -Improving on diuretics, Unna boots placed  Subjective: -Feels better overall, no withdrawal symptoms, breathing and swelling improving  Assessment and Plan:  Acute on chronic diastolic CHF -Last echo 4/23 with EF 60-65%, indeterminate diastolic function and normal RV -Continue Lasix 80 Mg twice daily, improving on diuretics, urine output is inaccurate, weight down 22 pounds, cut down Lasix to 40 Mg twice daily, transition to oral diuretics tomorrow -Continue Jardiance, creatinine improving, continue Aldactone increased dose to 25 Mg -Carvedilol held with bradycardia -Repeat echo with EF 60-65%, mildly diminished RV, moderately enlarged -Unna boots -Alcohol cessation counseling, dietitian consult -PT OT eval completed, SNF recommended, TOC consult -Discharge planning  Hyponatremia Hyperkalemia -Secondary to hypervolemia and diuretics, improving  AKI on CKD 3a -creatinine elevated 2.32 from baseline 1.6. -Likely cardiorenal, avoid hypotension hold amlodipine and hydralazine -Improved pain, now down to 1.68 today  EtOH abuse -Drinks 8 beers per day, previously drinking 18 beers a day -High risk of withdrawal, continue  thiamine and multivitamin, no evidence of withdrawal thus far   Hypertension -Holding amlodipine, hydralazine and carvedilol   Hyperlipidemia - Continue home atorvastatin   GERD - Continue home PPI   Obesity - Noted   OSA - Continue home CPAP   Permanent A-fib -Mild bradycardia, hold Coreg - Continue Eliquis   History of prostate cancer - Noted   Diabetes - SSI   COPD -Continue Dulera, as needed albuterol   PTSD Depression Anxiety - Continue home Xanax   Gout -Resume home allopurinol   DVT prophylaxis:      Eliquis Code Status:              Full Family Communication:       None on admission  Disposition Plan: SNF likely tomorrow  Consultants:    Procedures:   Antimicrobials:    Objective: Vitals:   12/28/22 2227 12/29/22 0331 12/29/22 0731 12/29/22 0815  BP:  113/74 (!) 100/58   Pulse:   66   Resp: 16 16 18 18   Temp:  97.7 F (36.5 C) 98.3 F (36.8 C)   TempSrc:  Oral Oral   SpO2:   98%   Weight:  131.3 kg    Height:        Intake/Output Summary (Last 24 hours) at 12/29/2022 1027 Last data filed at 12/29/2022 0422 Gross per 24 hour  Intake 540 ml  Output --  Net 540 ml    Filed Weights   12/27/22 0525 12/28/22 0545 12/29/22 0331  Weight: 135 kg 132.8 kg 131.3 kg    Examination:  General exam: Obese chronically ill male sitting up in the recliner, AAOx3 HEENT: Neck obese unable to assess JVD CVS: S1-S2, irregularly irregular rhythm Lungs: Distant breath sounds Abdomen: Soft, nontender, bowel sounds present Extremities: 1-2+  edema, Unna boots on Skin: Skin changes in lower legs Psychiatry:  Mood & affect appropriate.     Data Reviewed:   CBC: Recent Labs  Lab 12/24/22 0414 12/25/22 0558 12/26/22 0451  WBC 5.9 6.6 6.1  NEUTROABS 4.1  --   --   HGB 10.2* 11.1* 10.2*  HCT 30.3* 33.3* 31.2*  MCV 92.7 93.8 93.4  PLT 140* 159 151   Basic Metabolic Panel: Recent Labs  Lab 12/24/22 1454 12/24/22 2140 12/25/22 0558  12/26/22 0451 12/27/22 0334 12/28/22 0747 12/29/22 0124  NA 128*   < > 127* 129* 131* 135 135  K 5.2*   < > 4.9 4.7 4.6 4.3 3.9  CL 96*   < > 94* 94* 96* 98 98  CO2 22   < > 21* 24 26 23 25   GLUCOSE 115*   < > 116* 124* 127* 153* 124*  BUN 62*   < > 64* 63* 60* 58* 59*  CREATININE 2.07*   < > 1.98* 1.95* 1.95* 1.76* 1.68*  CALCIUM 10.9*   < > 10.8* 10.4* 10.6* 11.0* 10.8*  MG 2.5*  --   --   --   --   --   --    < > = values in this interval not displayed.   GFR: Estimated Creatinine Clearance: 53.3 mL/min (A) (by C-G formula based on SCr of 1.68 mg/dL (H)). Liver Function Tests: Recent Labs  Lab 12/24/22 0414  AST 17  ALT 18  ALKPHOS 85  BILITOT 0.6  PROT 6.3*  ALBUMIN 4.2   No results for input(s): "LIPASE", "AMYLASE" in the last 168 hours. No results for input(s): "AMMONIA" in the last 168 hours. Coagulation Profile: No results for input(s): "INR", "PROTIME" in the last 168 hours. Cardiac Enzymes: No results for input(s): "CKTOTAL", "CKMB", "CKMBINDEX", "TROPONINI" in the last 168 hours. BNP (last 3 results) No results for input(s): "PROBNP" in the last 8760 hours. HbA1C: No results for input(s): "HGBA1C" in the last 72 hours. CBG: Recent Labs  Lab 12/28/22 0748 12/28/22 1118 12/28/22 1704 12/28/22 2147 12/29/22 0729  GLUCAP 171* 113* 128* 114* 113*   Lipid Profile: No results for input(s): "CHOL", "HDL", "LDLCALC", "TRIG", "CHOLHDL", "LDLDIRECT" in the last 72 hours. Thyroid Function Tests: No results for input(s): "TSH", "T4TOTAL", "FREET4", "T3FREE", "THYROIDAB" in the last 72 hours. Anemia Panel: No results for input(s): "VITAMINB12", "FOLATE", "FERRITIN", "TIBC", "IRON", "RETICCTPCT" in the last 72 hours. Urine analysis:    Component Value Date/Time   COLORURINE COLORLESS (A) 12/24/2022 0628   APPEARANCEUR CLEAR 12/24/2022 0628   LABSPEC 1.007 12/24/2022 0628   PHURINE 5.5 12/24/2022 0628   GLUCOSEU NEGATIVE 12/24/2022 0628   HGBUR NEGATIVE  12/24/2022 0628   BILIRUBINUR NEGATIVE 12/24/2022 0628   KETONESUR NEGATIVE 12/24/2022 0628   PROTEINUR NEGATIVE 12/24/2022 0628   UROBILINOGEN 0.2 03/24/2013 1255   NITRITE NEGATIVE 12/24/2022 0628   LEUKOCYTESUR NEGATIVE 12/24/2022 0628   Sepsis Labs: @LABRCNTIP (procalcitonin:4,lacticidven:4)  ) Recent Results (from the past 240 hour(s))  MRSA Next Gen by PCR, Nasal     Status: Abnormal   Collection Time: 12/24/22  1:53 PM   Specimen: Nasal Mucosa; Nasal Swab  Result Value Ref Range Status   MRSA by PCR Next Gen DETECTED (A) NOT DETECTED Final    Comment: CRITICAL RESULT CALLED TO, READ BACK BY AND VERIFIED WITH: RN Donia Ast [id number] BY J RAZZAK, MT (NOTE) The GeneXpert MRSA Assay (FDA approved for NASAL specimens only), is one component of a comprehensive  MRSA colonization surveillance program. It is not intended to diagnose MRSA infection nor to guide or monitor treatment for MRSA infections. Test performance is not FDA approved in patients less than 35 years old. Performed at [facility], [address] [place], [place] [address]      Radiology Studies: No results found.   Scheduled Meds:  allopurinol  100 mg Oral Daily   ALPRAZolam  1 mg Oral QHS   apixaban  5 mg Oral BID   atorvastatin  40 mg Oral Q supper   empagliflozin  10 mg Oral Daily   furosemide  80 mg Intravenous BID   gabapentin  300 mg Oral TID   insulin aspart  0-9 Units Subcutaneous TID WC   mometasone-formoterol  2 puff Inhalation BID   multivitamin with minerals  1 tablet Oral Daily   pantoprazole  40 mg Oral Daily   sodium chloride flush  3 mL Intravenous Q12H   spironolactone  25 mg Oral q morning   thiamine  100 mg Oral Daily   umeclidinium bromide  1 puff Inhalation Daily   Continuous Infusions:   LOS: 4 days    Time spent:    Zannie Cove, MD [facility]   12/29/2022, 10:27 AM

## 2022-12-29 NOTE — TOC Progression Note (Addendum)
Transition of Care [facility]) - Progression Note    Patient Details  Name: Francis Cardenas MRN: [id number] Date of Birth: Dec 17, 1950  Transition [facility]) CM/SW Contact  Delilah Shan, LCSWA Phone Number: 12/29/2022, 11:08 AM  Clinical Narrative:     CSW spoke with patient at bedside and provided SNF bed offers. Patient accepted SNF bed offer with [facility]. CSW confirmed SN[facility] bed with Jan[facility] at [facility].CSW will start insurance authorization for patient. CSW will continue to follow and assist with patients dc planning needs.  Update- CSW started insurance authorization for patient Auth ID# [id number]. Insurance authorization currently pending for SNF. CSW spoke with patients spouse who confirmed she will bring patients cpap over to facility.   Expected Discharge Plan: Skilled Nursing Facility Barriers to Discharge: Continued Medical Work up  Expected Discharge Plan and Services In-house Referral: Clinical Social Work     Living arrangements for the past 2 months: Single Family Home                                       Social Determinants of Health (SDOH) Interventions SDOH Screenings   Food Insecurity: No Food Insecurity (12/26/2022)  Housing: Low Risk  (12/26/2022)  Transportation Needs: No Transportation Needs (12/24/2022)  Utilities: Not At Risk (12/24/2022)  Alcohol Screen: Low Risk  (12/26/2022)  Financial Resource Strain: Low Risk  (12/26/2022)  Tobacco Use: Medium Risk (12/24/2022)    Readmission Risk Interventions    09/02/2021   11:32 AM  Readmission Risk Prevention Plan  Transportation Screening Complete  PCP or Specialist Appt within 3-5 Days --  HRI or Home Care Consult Complete  Palliative Care Screening Not Applicable  Medication Review (RN Care Manager) Complete

## 2022-12-29 NOTE — Progress Notes (Addendum)
   12/29/22 1434  Mobility  Activity Ambulated with assistance in hallway  Level of Assistance Contact guard assist, steadying assist  Assistive Device Front wheel walker;Four wheel walker  Distance Ambulated (ft) 400 ft  Activity Response Tolerated well  Mobility Referral Yes  $Mobility charge 1 Mobility  Mobility Specialist Start Time (ACUTE ONLY) 1340  Mobility Specialist Stop Time (ACUTE ONLY) 1403  Mobility Specialist Time Calculation (min) (ACUTE ONLY) 23 min   Mobility Specialist: Progress Note  During Mobility: HR 99, SpO2 94% Post mobility: HR 91, SpO2 93%  Pt received in bed, agreeable to mobility session. Pt used RW for half of session then offered to use bariatric rollator for rest of the session duration d/t improper use of RW.  Pt stated he uses rollator at home, performed very well using rollator. Pt SOB during ambulation, given pursed lip breathing cues, VSS. After session, pt returned to bed with all needs met, call bell within reach.    Barnie Mort Mobility Specialist Please contact via SecureChat or Rehab office at [phone]

## 2022-12-29 NOTE — Progress Notes (Signed)
   Heart Failure Stewardship Pharmacist Progress Note   PCP: Benjiman Core, MD PCP-Cardiologist: Jodelle Red, MD    HPI:  72 yo M with PMH of CHF, afib, COPD, T2DM, HTN, HLD, OSA on CPAP, CKD III, obesity, prostate cancer, and alcohol dependence.   Presented to the ED on 8/3 with LE edema, shortness of breath, orthopnea, and chest pressure. CXR with cardiomegaly and pulmonary edema. BNP 287. ECHO 8/4 showed LVEF 60-65%, no RWMA, mild LVH, RV mildly reduced, mildly elevated PA pressure, trivial MR.   Current HF Medications: Diuretic: furosemide 40 mg IV BID MRA: spironolactone 25 mg daily SGLT2i: Jardiance 10 mg daily  Prior to admission HF Medications: Diuretic: torsemide 20 mg BID Beta blocker: metoprolol XL 12.5 mg daily MRA: spironolactone 25 mg daily SGLT2i: Jardiance 12.5 mg BID  Pertinent Lab Values: Serum creatinine 1.68, BUN 59, Potassium 3.9, Sodium 135, BNP 287.3, Magnesium 2.5   Vital Signs: Weight: 289 lbs (admission weight: 304 lbs) Blood pressure: 100-110/50s  Heart rate: 60-70s  I/O: incomplete   Medication Assistance / Insurance Benefits Check: Does the patient have prescription insurance?  Yes Type of insurance plan: [facility]  Outpatient Pharmacy:  Prior to admission outpatient pharmacy: [facility] Is the patient willing to use [facility] TOC pharmacy at discharge? Yes Is the patient willing to transition their outpatient pharmacy to utilize a [facility] outpatient pharmacy?   No    Assessment: 1. Acute on chronic diastolic CHF (LVEF 60-65%). NYHA class III symptoms. - Continue furosemide 40 mg IV BID. Volume status improving. Strict I/Os and daily weights. Keep K>4 and Mg>2. - Holding metoprolol with bradycardia on admission - No ACE/ARB/ARNI with AKI on CKD. Baseline appears ~1.6.  - Agree with increasing to spironolactone 25 mg daily. Splitting tablets is difficult for him to do with his carpal tunnel.  - Continue Jardiance 10 mg  daily - was taking 12.5 mg BID prior to admission. Likely can consolidate to 25 mg once daily at discharge.   Plan: 1) Medication changes recommended at this time: - Change to Jardiance once daily at discharge  2) Patient assistance: - None - has [employer] benefits  3)  Education  - Patient has been educated on current HF medications and potential additions to HF medication regimen - Patient verbalizes understanding that over the next few months, these medication doses may change and more medications may be added to optimize HF regimen - Patient has been educated on basic disease state pathophysiology and goals of therapy   Sharen Hones, PharmD, BCPS Heart Failure Stewardship Pharmacist Phone [phone]

## 2022-12-29 NOTE — Progress Notes (Signed)
Orthopedic Tech Progress Note Patient Details:  Francis Cardenas 09-29-1950 [id number]  Ortho Devices Type of Ortho Device: Radio broadcast assistant Ortho Device/Splint Location: BLE Ortho Device/Splint Interventions: Ordered, Application   Post Interventions Patient Tolerated: Well Instructions Provided: Care of device, Adjustment of device   A  12/29/2022, 3:54 PM

## 2022-12-30 ENCOUNTER — Other Ambulatory Visit (HOSPITAL_COMMUNITY): Payer: Self-pay

## 2022-12-30 DIAGNOSIS — I5033 Acute on chronic diastolic (congestive) heart failure: Secondary | ICD-10-CM | POA: Diagnosis not present

## 2022-12-30 LAB — GLUCOSE, CAPILLARY
Glucose-Capillary: 112 mg/dL — ABNORMAL HIGH (ref 70–99)
Glucose-Capillary: 149 mg/dL — ABNORMAL HIGH (ref 70–99)

## 2022-12-30 MED ORDER — METOPROLOL SUCCINATE ER 25 MG PO TB24
12.5000 mg | ORAL_TABLET | Freq: Every day | ORAL | Status: DC
  Administered 2022-12-30 – 2023-01-01 (×3): 12.5 mg via ORAL
  Filled 2022-12-30 (×3): qty 1

## 2022-12-30 MED ORDER — EMPAGLIFLOZIN 25 MG PO TABS
25.0000 mg | ORAL_TABLET | Freq: Every day | ORAL | 0 refills | Status: DC
Start: 2022-12-30 — End: 2023-01-01
  Filled 2022-12-30: qty 30, 30d supply, fill #0

## 2022-12-30 MED ORDER — GABAPENTIN 300 MG PO CAPS: 300.0000 mg | ORAL_CAPSULE | Freq: Two times a day (BID) | ORAL | Status: DC

## 2022-12-30 MED ORDER — ALPRAZOLAM 1 MG PO TABS: 1.0000 mg | ORAL_TABLET | Freq: Every day | ORAL | 0 refills | Status: DC

## 2022-12-30 MED ORDER — TORSEMIDE 20 MG PO TABS
20.0000 mg | ORAL_TABLET | Freq: Two times a day (BID) | ORAL | Status: DC
  Administered 2022-12-30 – 2023-01-01 (×5): 20 mg via ORAL
  Filled 2022-12-30 (×5): qty 1

## 2022-12-30 NOTE — TOC Progression Note (Addendum)
Transition of Care [facility]) - Progression Note    Patient Details  Name: Francis Cardenas MRN: [id number] Date of Birth: 1950-09-17  Transition [facility] Care [facility]) CM/SW Contact  Delilah Shan, LCSWA Phone Number: 12/30/2022, 11:44 AM  Clinical Narrative:     Update- 4:06pm- Patient insurance still currently pending for SNF.   Patients insurance requested additional clinicals for review. CSW submitted clinicals requested to patients insurance. CSW informed MD and facility. CSW will continue to follow.   Expected Discharge Plan: Skilled Nursing Facility Barriers to Discharge: Continued Medical Work up  Expected Discharge Plan and Services In-house Referral: Clinical Social Work     Living arrangements for the past 2 months: Single Family Home Expected Discharge Date: 12/30/22                                     Social Determinants of Health (SDOH) Interventions SDOH Screenings   Food Insecurity: No Food Insecurity (12/26/2022)  Housing: Low Risk  (12/26/2022)  Transportation Needs: No Transportation Needs (12/24/2022)  Utilities: Not At Risk (12/24/2022)  Alcohol Screen: Low Risk  (12/26/2022)  Financial Resource Strain: Low Risk  (12/26/2022)  Tobacco Use: Medium Risk (12/24/2022)    Readmission Risk Interventions    09/02/2021   11:32 AM  Readmission Risk Prevention Plan  Transportation Screening Complete  PCP or Specialist Appt within 3-5 Days --  HRI or Home Care Consult Complete  Palliative Care Screening Not Applicable  Medication Review (RN Care Manager) Complete

## 2022-12-30 NOTE — Plan of Care (Signed)

## 2022-12-30 NOTE — Discharge Summary (Signed)
Physician Discharge Summary  Francis Cardenas:[id number] DOB: 1950-09-12 DOA: 12/24/2022  PCP: Benjiman Core, MD  Admit date: 12/24/2022 Discharge date: 12/30/2022  Time spent: 45 minutes  Recommendations for Outpatient Follow-up:  SNF for short-term rehab Please monitor weights closely, give additional torsemide 20 Mg for weight gain of 3 LB in 1 day or 5 LB in 1 week Cardiology Dr. Cristal Deer in 1 month BMP in 1 week   Discharge Diagnoses:  Acute on chronic diastolic CHF Morbid obesity   Depressive disorder   Alcohol dependence (HCC)   PTSD (post-traumatic stress disorder)   Benign essential hypertension   COPD with asthma   GERD (gastroesophageal reflux disease)   Mixed hyperlipidemia   Type 2 diabetes mellitus with obesity (HCC)   OSA (obstructive sleep apnea)   Morbid obesity (HCC)   Prostate cancer (HCC)   Persistent atrial fibrillation (HCC)   Acute renal failure superimposed on stage 3a chronic kidney disease (HCC)   Acute on chronic diastolic CHF (congestive heart failure) (HCC)   Chronic kidney disease, stage 3 (HCC)   Anxiety state   Chronic pain due to trauma   Acute exacerbation of CHF (congestive heart failure) (HCC)   Discharge Condition: Improved  Diet recommendation: Low-salt DM, heart healthy  Filed Weights   12/28/22 0545 12/29/22 0331 12/30/22 0406  Weight: 132.8 kg 131.3 kg 130.1 kg    History of present illness:  71/M with history of chronic diastolic CHF, morbid obesity, heavy alcohol use, hypertension, GERD, OSA, paroxysmal A-fib, prostate cancer, type 2 diabetes mellitus, COPD, CKD 3 AA, PTSD, depression, anxiety, gout, chronic pain presented to the ED with worsening edema and shortness of breath for few months,.  He is on CHF meds including torsemide at baseline, reports compliance with this, has not followed up with cardiology in well over a year.  In the ED vitals stable, sodium 125, potassium 5.4, creatinine 2.3 up from baseline of  1.6, calcium of 10.7, hemoglobin 10.2, troponin negative, BNP 287, chest x-ray noted cardiomegaly and concern for pulmonary edema  Hospital Course:   Acute on chronic diastolic CHF -Last echo 4/23 with EF 60-65%, indeterminate diastolic function and normal RV -Diuresed well with IV Lasix, weight is down 25 LB to 286.9 pounds at discharge today -Transitioned to torsemide 20 Mg twice daily, advised to take an extra dose for weight gain -Continue Jardiance, Aldactone  -Repeat echo with EF 60-65%, mildly diminished RV, moderately enlarged -Alcohol cessation counseled, dietitian consult appreciated -PT OT eval completed, SNF recommended,  -He will be discharged to short-term rehab, needs follow-up with Dr. Cristal Deer in 1 month and repeat labs in 1 week   Hyponatremia Hyperkalemia -Secondary to hypervolemia and diuretics, improving   AKI on CKD 3a -creatinine elevated 2.32 on admission from baseline 1.6. -Likely cardiorenal, avoid hypotension hold amlodipine and hydralazine -Improved pain, now down to 1.7   EtOH abuse -Drinks 8 beers per day, previously drinking 18 beers a day -No evidence of withdrawal, counseled regarding EtOH cessation   Hypertension -Discontinued amlodipine and hydralazine   Hyperlipidemia - Continue home atorvastatin   GERD - Continue home PPI   Obesity - Noted   OSA - Continue home CPAP   Permanent A-fib -Mild bradycardia, hold Coreg - Continue Eliquis   History of prostate cancer - Noted   Diabetes - SSI   COPD -Continue Dulera, as needed albuterol   PTSD Depression Anxiety - Continue home Xanax   Gout -Resume home allopurinol    Discharge Exam: Vitals:  12/30/22 0735 12/30/22 0827  BP: 124/68   Pulse: 71   Resp: 18 18  Temp: 98.7 F (37.1 C)   SpO2: 96%    General exam: Obese chronically ill male sitting up in the recliner, AAOx3 HEENT: Neck obese unable to assess JVD CVS: S1-S2, irregularly irregular rhythm Lungs:  Distant breath sounds Abdomen: Soft, nontender, bowel sounds present Extremities: 1-2+ edema, Unna boots on Skin: Skin changes in lower legs Psychiatry:  Mood & affect appropriate.      Discharge Instructions   Discharge Instructions     Diet - low sodium heart healthy   Complete by: As directed    Diet Carb Modified   Complete by: As directed    Increase activity slowly   Complete by: As directed       Allergies as of 12/30/2022       Reactions   Oxcarbazepine Other (See Comments)   Dizziness/ lightheaded   Zolpidem Nausea Only        Medication List     STOP taking these medications    amLODipine 10 MG tablet Commonly known as: NORVASC   hydrALAZINE 25 MG tablet Commonly known as: APRESOLINE       TAKE these medications    acetaminophen 325 MG tablet Commonly known as: Tylenol Take 2 tablets (650 mg total) by mouth every 6 (six) hours as needed.   albuterol 108 (90 Base) MCG/ACT inhaler Commonly known as: VENTOLIN HFA Inhale 2 puffs into the lungs every 6 (six) hours as needed for wheezing.   allopurinol 100 MG tablet Commonly known as: ZYLOPRIM Take 100 mg by mouth daily. What changed: Another medication with the same name was removed. Continue taking this medication, and follow the directions you see here.   ALPRAZolam 1 MG tablet Commonly known as: XANAX Take 1 tablet (1 mg total) by mouth at bedtime.   apixaban 5 MG Tabs tablet Commonly known as: ELIQUIS Take 1 tablet (5 mg total) by mouth 2 (two) times daily.   atorvastatin 80 MG tablet Commonly known as: LIPITOR Take 40 mg by mouth daily with supper.   fluticasone-salmeterol 250-50 MCG/ACT Aepb Commonly known as: Wixela Inhub Inhale 1 puff into the lungs in the morning and at bedtime.   gabapentin 300 MG capsule Commonly known as: NEURONTIN Take 1 capsule (300 mg total) by mouth 2 (two) times daily. What changed: when to take this   Jardiance 25 MG Tabs tablet Generic drug:  empagliflozin Take 12.5 mg by mouth 2 (two) times daily.   Magnesium Oxide 420 MG Tabs Take 420 mg by mouth daily with supper.   metoprolol succinate 25 MG 24 hr tablet Commonly known as: TOPROL-XL Take 0.5 tablets (12.5 mg total) by mouth daily.   multivitamin with minerals tablet Take 1 tablet by mouth every Monday, Wednesday, and Friday.   omeprazole 20 MG capsule Commonly known as: PRILOSEC Take 20 mg by mouth daily with supper.   potassium chloride SA 20 MEQ tablet Commonly known as: KLOR-CON M Take 1 tablet (20 mEq total) by mouth daily with supper.   Spiriva Respimat 2.5 MCG/ACT Aers Generic drug: Tiotropium Bromide Monohydrate Inhale 2 puffs into the lungs daily.   spironolactone 25 MG tablet Commonly known as: ALDACTONE Take 25 mg by mouth every morning.   thiamine 100 MG tablet Commonly known as: Vitamin B-1 Take 100 mg by mouth daily.   torsemide 20 MG tablet Commonly known as: DEMADEX Take 20 mg by mouth 2 (two) times daily.  vitamin B-12 500 MCG tablet Commonly known as: CYANOCOBALAMIN Take 500 mcg by mouth daily with supper.       Allergies  Allergen Reactions   Oxcarbazepine Other (See Comments)    Dizziness/ lightheaded   Zolpidem Nausea Only    Contact information for follow-up providers     [facility]. Go in 9 day(s).   Specialty: Cardiology Why: Hospital follow up 01/05/2023 @ 9 am PLEASE bring a current medication list to appointment FREE valet parking, , Entr[address], off [employer] information: [address] [place] [address] [phone]             Contact information for after-discharge care     Destination     [facility] Preferred SNF .   Service: Skilled Nursing Contact information: [address] [place] [address] [phone]                      The results of  significant diagnostics from this hospitalization (including imaging, microbiology, ancillary and laboratory) are listed below for reference.    Significant Diagnostic Studies: ECHOCARDIOGRAM COMPLETE  Result Date: 12/25/2022    ECHOCARDIOGRAM REPORT   Patient Name:   Francis Cardenas Date of Exam: 12/25/2022 Medical Rec #:  [id number]      Height:       70.0 in Accession #:    [id number]     Weight:       304.2 lb Date of Birth:  Feb 18, 1951     BSA:          2.495 m Patient Age:    71 years       BP:           106/44 mmHg Patient Gender: M              HR:           60 bpm. Exam Location:  Inpatient Procedure: 2D Echo, Cardiac Doppler, Color Doppler and Intracardiac            Opacification Agent Indications:    CHF-Acute Diastolic I50.31  History:        Patient has prior history of Echocardiogram examinations, most                 recent 08/31/2021. CHF, COPD; Risk Factors:Sleep Apnea, Diabetes,                 Former Smoker and Dyslipidemia.  Sonographer:    Dondra Prader RVT RCS Referring Phys: [id number] Cecille Po [facility]  Sonographer Comments: Technically difficult study due to poor echo windows and patient is obese. IMPRESSIONS  1. Left ventricular ejection fraction, by estimation, is 60 to 65%. The left ventricle has normal function. The left ventricle has no regional wall motion abnormalities. There is mild left ventricular hypertrophy. Left ventricular diastolic parameters are indeterminate.  2. Right ventricular systolic function is mildly reduced. The right ventricular size is moderately enlarged. There is mildly elevated pulmonary artery systolic pressure. The estimated right ventricular systolic pressure is 40.5 mmHg.  3. Left atrial size was moderately dilated.  4. Right atrial size was mildly dilated.  5. A small pericardial effusion is present. The pericardial effusion is anterior to the right ventricle.  6. The mitral valve is grossly normal. Trivial mitral valve regurgitation. No evidence of mitral  stenosis.  7. The aortic valve is tricuspid. Aortic valve regurgitation is not visualized.  Aortic valve sclerosis/calcification is present, without any evidence of aortic stenosis.  8. The inferior vena cava is dilated in size with >50% respiratory variability, suggesting right atrial pressure of 8 mmHg. FINDINGS  Left Ventricle: Left ventricular ejection fraction, by estimation, is 60 to 65%. The left ventricle has normal function. The left ventricle has no regional wall motion abnormalities. Definity contrast agent was given IV to delineate the left ventricular  endocardial borders. The left ventricular internal cavity size was normal in size. There is mild left ventricular hypertrophy. Left ventricular diastolic parameters are indeterminate. Right Ventricle: The right ventricular size is moderately enlarged. No increase in right ventricular wall thickness. Right ventricular systolic function is mildly reduced. There is mildly elevated pulmonary artery systolic pressure. The tricuspid regurgitant velocity is 2.85 m/s, and with an assumed right atrial pressure of 8 mmHg, the estimated right ventricular systolic pressure is 40.5 mmHg. Left Atrium: Left atrial size was moderately dilated. Right Atrium: Right atrial size was mildly dilated. Pericardium: A small pericardial effusion is present. The pericardial effusion is anterior to the right ventricle. Presence of epicardial fat layer. Mitral Valve: The mitral valve is grossly normal. Trivial mitral valve regurgitation. No evidence of mitral valve stenosis. Tricuspid Valve: The tricuspid valve is normal in structure. Tricuspid valve regurgitation is trivial. No evidence of tricuspid stenosis. Aortic Valve: The aortic valve is tricuspid. Aortic valve regurgitation is not visualized. Aortic valve sclerosis/calcification is present, without any evidence of aortic stenosis. Aortic valve mean gradient measures 5.0 mmHg. Aortic valve peak gradient measures 9.4 mmHg. Aortic  valve area, by VTI measures 3.61 cm. Pulmonic Valve: The pulmonic valve was not well visualized. Pulmonic valve regurgitation is not visualized. No evidence of pulmonic stenosis. Aorta: The aortic root is normal in size and structure. Ascending aorta measurements are within normal limits for age when indexed to body surface area. Venous: The inferior vena cava is dilated in size with greater than 50% respiratory variability, suggesting right atrial pressure of 8 mmHg. IAS/Shunts: No atrial level shunt detected by color flow Doppler.  LEFT VENTRICLE PLAX 2D LVIDd:         5.40 cm   Diastology LVIDs:         2.80 cm   LV e' medial: 9.73 cm/s LV PW:         1.30 cm LV IVS:        1.00 cm LVOT diam:     2.30 cm LV SV:         118 LV SV Index:   47 LVOT Area:     4.15 cm  RIGHT VENTRICLE             IVC RV Basal diam:  4.40 cm     IVC diam: 2.50 cm RV S prime:     15.30 cm/s TAPSE (M-mode): 2.5 cm LEFT ATRIUM              Index        RIGHT ATRIUM           Index LA diam:        4.30 cm  1.72 cm/m   RA Area:     23.90 cm LA Vol (A2C):   118.0 ml 47.30 ml/m  RA Volume:   77.40 ml  31.03 ml/m LA Vol (A4C):   104.0 ml 41.69 ml/m LA Biplane Vol: 116.0 ml 46.50 ml/m  AORTIC VALVE  PULMONIC VALVE AV Area (Vmax):    3.49 cm      PV Vmax:       0.81 m/s AV Area (Vmean):   3.40 cm      PV Peak grad:  2.7 mmHg AV Area (VTI):     3.61 cm AV Vmax:           153.50 cm/s AV Vmean:          103.000 cm/s AV VTI:            0.326 m AV Peak Grad:      9.4 mmHg AV Mean Grad:      5.0 mmHg LVOT Vmax:         129.00 cm/s LVOT Vmean:        84.200 cm/s LVOT VTI:          0.283 m LVOT/AV VTI ratio: 0.87  AORTA Ao Root diam: 3.10 cm Ao Asc diam:  3.80 cm TRICUSPID VALVE TR Peak grad:   32.5 mmHg TR Vmax:        285.00 cm/s  SHUNTS Systemic VTI:  0.28 m Systemic Diam: 2.30 cm Weston Brass MD Electronically signed by Weston Brass MD Signature Date/Time: 12/25/2022/10:47:22 AM    Final    DG Chest Port 1  View  Result Date: 12/24/2022 CLINICAL DATA:  Shortness of breath, edema. Leg swelling for 2 months with orthopnea. EXAM: PORTABLE CHEST 1 VIEW COMPARISON:  10/11/2022 FINDINGS: Hazy density at the bases. Chronic cardiomegaly. There could be a hiatal hernia with gas bubble projecting over the left lung base. Stable central vessels. The left lateral costophrenic sulcus is excluded from view. No visible pleural fluid. No pneumothorax. IMPRESSION: Symmetric hazy density at the bases, history suggesting edema. Stable cardiomegaly. Electronically Signed   By: Tiburcio Pea M.D.   On: 12/24/2022 05:01    Microbiology: Recent Results (from the past 240 hour(s))  MRSA Next Gen by PCR, Nasal     Status: Abnormal   Collection Time: 12/24/22  1:53 PM   Specimen: Nasal Mucosa; Nasal Swab  Result Value Ref Range Status   MRSA by PCR Next Gen DETECTED (A) NOT DETECTED Final    Comment: CRITICAL RESULT CALLED TO, READ BACK BY AND VERIFIED WITH: RN Donia Ast [id number] BY J RAZZAK, MT (NOTE) The GeneXpert MRSA Assay (FDA approved for NASAL specimens only), is one component of a comprehensive MRSA colonization surveillance program. It is not intended to diagnose MRSA infection nor to guide or monitor treatment for MRSA infections. Test performance is not FDA approved in patients less than 73 years old. Performed at [facility], [address] [place], [place] [address]      Labs: Basic Metabolic Panel: Recent Labs  Lab 12/24/22 1454 12/24/22 2140 12/26/22 0451 12/27/22 0334 12/28/22 0747 12/29/22 0124 12/30/22 0706  NA 128*   < > 129* 131* 135 135 140  K 5.2*   < > 4.7 4.6 4.3 3.9 3.9  CL 96*   < > 94* 96* 98 98 102  CO2 22   < > 24 26 23 25 26   GLUCOSE 115*   < > 124* 127* 153* 124* 121*  BUN 62*   < > 63* 60* 58* 59* 49*  CREATININE 2.07*   < > 1.95* 1.95* 1.76* 1.68* 1.75*  CALCIUM 10.9*   < > 10.4* 10.6* 11.0* 10.8* 11.1*  MG 2.5*  --   --   --   --   --   --    < > =  values in this interval not displayed.   Liver Function Tests: Recent Labs  Lab 12/24/22 0414  AST 17  ALT 18  ALKPHOS 85  BILITOT 0.6  PROT 6.3*  ALBUMIN 4.2   No results for input(s): "LIPASE", "AMYLASE" in the last 168 hours. No results for input(s): "AMMONIA" in the last 168 hours. CBC: Recent Labs  Lab 12/24/22 0414 12/25/22 0558 12/26/22 0451  WBC 5.9 6.6 6.1  NEUTROABS 4.1  --   --   HGB 10.2* 11.1* 10.2*  HCT 30.3* 33.3* 31.2*  MCV 92.7 93.8 93.4  PLT 140* 159 151   Cardiac Enzymes: No results for input(s): "CKTOTAL", "CKMB", "CKMBINDEX", "TROPONINI" in the last 168 hours. BNP: BNP (last 3 results) Recent Labs    04/05/22 1444 06/14/22 2004 12/24/22 0414  BNP 157.8* 129.7* 287.3*    ProBNP (last 3 results) No results for input(s): "PROBNP" in the last 8760 hours.  CBG: Recent Labs  Lab 12/29/22 0729 12/29/22 1139 12/29/22 1543 12/29/22 2123 12/30/22 0733  GLUCAP 113* 135* 146* 133* 112*       Signed:  Zannie Cove MD.  [facility] 12/30/2022, 10:52 AM

## 2022-12-30 NOTE — Progress Notes (Signed)
[facility] night cross cover note:   I was notified by RN that the original plan was to discharge this patient today, but that the discharge order was subsequently canceled in the setting of complications relating to insurance authorization for SNF.  Currently without peripheral IV access, but does not appear to have any orders for scheduled IV medications nor is he on IV fluids.  Will refrain from reestablishing peripheral IV access at this time.    Newton Pigg, DO Hospitalist

## 2022-12-30 NOTE — Progress Notes (Signed)
Physical Therapy Treatment Patient Details Name: Francis Cardenas MRN: [id number] DOB: 1950-08-23 Today's Date: 12/30/2022   History of Present Illness Pt is a 72 y/o M presenting to ED On 8/3 with BLE edema and SOB. PMH includes HTN, HLD, COPD, sleep apnea, paroxysmal A fib on apabaxin, diastolic heart failure, GERD, OSA, morbid obesity, heavy alcohol use, prostate CA, DM2, COPD, CKD III, PTSD, depression, gout, anxiety    PT Comments  Pt received sitting in recliner and agreeable to session. Pt very tangential throughout session explaining fall occurrences requiring redirection to mobility tasks. Pt demonstrates increased instability during turns and tends to leave AD aside at home leading to falls. Pt educated on importance of keeping AD with him and avoiding quick turns to reduce fall risk. Pt able to tolerate gait trial with cues for safety due to pt lifting RW, releasing with BUEs at times, and attempting to leave aside in the room twice. Pt able to perform 10 reps of STS from the recliner without UE support with no LOB and good power up with BLE. Pt coughing upon entry and pt reports coughing when drinking and that saliva will go down trachea at times. Pt would benefit from a SLP consult to assess any possible swallowing issues. Pt continues to benefit from PT services to progress toward functional mobility goals.    If plan is discharge home, recommend the following: A little help with walking and/or transfers;A little help with bathing/dressing/bathroom;Assistance with cooking/housework;Help with stairs or ramp for entrance;Assist for transportation   Can travel by private vehicle     No  Equipment Recommendations  Rollator (4 wheels)    Recommendations for Other Services Speech consult     Precautions / Restrictions Precautions Precautions: Fall Precaution Comments: 6 falls since April 2024 Restrictions Weight Bearing Restrictions: No     Mobility  Bed Mobility                General bed mobility comments: Pt beginning and ending session in recliner    Transfers Overall transfer level: Needs assistance Equipment used: Rolling walker (2 wheels) Transfers: Sit to/from Stand Sit to Stand: Supervision           General transfer comment: from recliner and toilet with supervision for safety    Ambulation/Gait Ambulation/Gait assistance: Contact guard assist, Supervision Gait Distance (Feet): 120 Feet Assistive device: Rolling walker (2 wheels) Gait Pattern/deviations: Step-through pattern, Decreased stride length, Wide base of support       General Gait Details: Pt tengential and intermittently lifting one or both UE from RW support requiring CGA for safety. Pt attempting to leave RW aside x2 requiring cues for safety.       Balance Overall balance assessment: Needs assistance Sitting-balance support: Feet supported Sitting balance-Leahy Scale: Good Sitting balance - Comments: sitting in recliner   Standing balance support: Bilateral upper extremity supported, During functional activity Standing balance-Leahy Scale: Fair Standing balance comment: light support on RW                            Cognition Arousal: Alert Behavior During Therapy: WFL for tasks assessed/performed Overall Cognitive Status: Within Functional Limits for tasks assessed                                          Exercises Other Exercises Other Exercises: STS  x10 without UE support    General Comments General comments (skin integrity, edema, etc.): VSS on RA      Pertinent Vitals/Pain Pain Assessment Pain Assessment: No/denies pain     PT Goals (current goals can now be found in the care plan section) Acute Rehab PT Goals Patient Stated Goal: to walk and get home PT Goal Formulation: With patient Time For Goal Achievement: 01/10/23 Potential to Achieve Goals: Good Progress towards PT goals: Progressing toward goals     Frequency    Min 1X/week      PT Plan         AM-PAC PT "6 Clicks" Mobility   Outcome Measure  Help needed turning from your back to your side while in a flat bed without using bedrails?: A Little Help needed moving from lying on your back to sitting on the side of a flat bed without using bedrails?: A Little Help needed moving to and from a bed to a chair (including a wheelchair)?: A Little Help needed standing up from a chair using your arms (e.g., wheelchair or bedside chair)?: A Little Help needed to walk in hospital room?: A Little Help needed climbing 3-5 steps with a railing? : A Lot 6 Click Score: 17    End of Session Equipment Utilized During Treatment: Gait belt Activity Tolerance: Patient tolerated treatment well Patient left: in chair;with call bell/phone within reach Nurse Communication: Mobility status PT Visit Diagnosis: Unsteadiness on feet (R26.81);Muscle weakness (generalized) (M62.81);Difficulty in walking, not elsewhere classified (R26.2)     Time: 9562-1308 PT Time Calculation (min) (ACUTE ONLY): 42 min  Charges:    $Gait Training: 23-37 mins $Therapeutic Activity: 8-22 mins PT General Charges $$ ACUTE PT VISIT: 1 Visit                     Johny Shock, PTA Acute Rehabilitation Services Secure Chat Preferred  [phone]    Johny Shock 12/30/2022, 9:52 AM

## 2022-12-30 NOTE — Progress Notes (Signed)
   Heart Failure Stewardship Pharmacist Progress Note   PCP: Benjiman Core, MD PCP-Cardiologist: Jodelle Red, MD    HPI:  72 yo M with PMH of CHF, afib, COPD, T2DM, HTN, HLD, OSA on CPAP, CKD III, obesity, prostate cancer, and alcohol dependence.   Presented to the ED on 8/3 with LE edema, shortness of breath, orthopnea, and chest pressure. CXR with cardiomegaly and pulmonary edema. BNP 287. ECHO 8/4 showed LVEF 60-65%, no RWMA, mild LVH, RV mildly reduced, mildly elevated PA pressure, trivial MR.   Current HF Medications: Diuretic: torsemide 20 mg BID Beta Blocker: metoprolol XL 12.5 mg daily MRA: spironolactone 25 mg daily SGLT2i: Jardiance 12.5 mg BID  Prior to admission HF Medications: Diuretic: torsemide 20 mg BID Beta blocker: metoprolol XL 12.5 mg daily MRA: spironolactone 25 mg daily SGLT2i: Jardiance 12.5 mg BID  Pertinent Lab Values: Serum creatinine 1.75, BUN 49, Potassium 3.9, Sodium 140, BNP 287.3, Magnesium 2.5   Vital Signs: Weight: 286 lbs (admission weight: 304 lbs) Blood pressure: 110/60s  Heart rate: 60-80s  I/O: incomplete   Medication Assistance / Insurance Benefits Check: Does the patient have prescription insurance?  Yes Type of insurance plan: [employer]  Outpatient Pharmacy:  Prior to admission outpatient pharmacy: [facility] Is the patient willing to use [facility] TOC pharmacy at discharge? Yes Is the patient willing to transition their outpatient pharmacy to utilize a [facility] outpatient pharmacy?   No    Assessment: 1. Acute on chronic diastolic CHF (LVEF 60-65%). NYHA class III symptoms. - Continue torsemide 20 mg BID at discharge. Volume status improving. Strict I/Os and daily weights. Keep K>4 and Mg>2. - Holding metoprolol with bradycardia on admission. Was resumed for discharge, consider discontinuing.  - No ACE/ARB/ARNI with AKI on CKD. Baseline appears ~1.6.  - Continue spironolactone 25 mg daily. Splitting tablets  is difficult for him to do with his carpal tunnel.  - Continue Jardiance 10 mg daily - was taking 12.5 mg BID prior to admission. Likely can consolidate to 25 mg once daily at discharge.   Plan: 1) Medication changes recommended at this time: - Change to Jardiance once daily on discharge - Stop metoprolol on discharge  2) Patient assistance: - None - has [employer] benefits  3)  Education  - Patient has been educated on current HF medications and potential additions to HF medication regimen - Patient verbalizes understanding that over the next few months, these medication doses may change and more medications may be added to optimize HF regimen - Patient has been educated on basic disease state pathophysiology and goals of therapy   Sharen Hones, PharmD, BCPS Heart Failure Stewardship Pharmacist Phone [phone]

## 2022-12-30 NOTE — Evaluation (Signed)
Clinical/Bedside Swallow Evaluation Patient Details  Name: Francis Cardenas MRN: [id number] Date of Birth: 1951-05-11  Today's Date: 12/30/2022 Time: SLP Start Time (ACUTE ONLY): 1130 SLP Stop Time (ACUTE ONLY): 1209 SLP Time Calculation (min) (ACUTE ONLY): 39 min  Past Medical History:  Past Medical History:  Diagnosis Date   Alcohol dependence (HCC)    BPH (benign prostatic hyperplasia)    Cancer (HCC)    prostate   COPD (chronic obstructive pulmonary disease) (HCC)    Diabetes mellitus without complication (HCC)    Dyspnea on exertion 07/14/2022   Elevated troponin 04/06/2019   Fall at home, initial encounter 09/14/2022   Hypercholesteremia    Hypertension    OSA on CPAP    Past Surgical History:  Past Surgical History:  Procedure Laterality Date   ACHILLES TENDON REPAIR Left    KNEE ARTHROSCOPY Left    RIGHT/LEFT HEART CATH AND CORONARY ANGIOGRAPHY N/A 04/09/2019   Procedure: RIGHT/LEFT HEART CATH AND CORONARY ANGIOGRAPHY;  Surgeon: [place], Peter M, MD;  Location: [facility] INVASIVE CV LAB;  Service: Cardiovascular;  Laterality: N/A;   TONSILLECTOMY     WRIST FRACTURE SURGERY Left    HPI:  Francis Cardenas is a 72 yo M who presented to the ED with worsening edema and shortness of breath for few months.  CXR 8/3: "Symmetric hazy density at the bases, history suggesting edema.  Stable cardiomegaly." Pt with history of chronic diastolic CHF, morbid obesity, heavy alcohol use, hypertension, GERD, OSA, paroxysmal A-fib, prostate cancer, type 2 diabetes mellitus, COPD, CKD 3 AA, PTSD, depression, anxiety, gout, chronic pain    Assessment / Plan / Recommendation  Clinical Impression  Pt presents with functional swallowing as assessed clinically.  Pt tolerated all consistencies trialed with no clinical s/s of aspiration.  Pt exhibited good oral clearance of solids.  There was coughing in absence of POs with pt bringing up some mucus. He reports pna once several years ago. He reports hx GERD.   Pt's CXR seemingly consistent with CHF rather than concerning for pna.  Pt reports wheezing at home with removal of CPAP and need to use rescue inhaler.  Since fall 09/14/22 wheezing has resolved, but he reports hoarse, raspy, lower register voice.  Pt's symptoms, including vocal changes may be c/w GERD. Reviwed reflux precautions with patient.  Pt may benefit from OP follow up for reflux management and/or voice therapy through [place].  Pt does not have any acute ST needs.    Recommend continuing regular texture diet with thin liquids.  SLP Visit Diagnosis: Dysphagia, unspecified (R13.10)    Aspiration Risk  No limitations    Diet Recommendation Regular;Thin liquid    Liquid Administration via: Cup;Straw Medication Administration: Whole meds with liquid Supervision: Patient able to self feed Compensations: Slow rate;Small sips/bites Postural Changes: Seated upright at 90 degrees;Remain upright for at least 30 minutes after po intake    Other  Recommendations Oral Care Recommendations: Oral care BID    Recommendations for follow up therapy are one component of a multi-disciplinary discharge planning process, led by the attending physician.  Recommendations may be updated based on patient status, additional functional criteria and insurance authorization.  Follow up Recommendations Outpatient SLP (for voice, if symptoms persist and are bothersome)      Assistance Recommended at Discharge    Functional Status Assessment Patient has not had a recent decline in their functional status  Frequency and Duration  (N/A)          Prognosis Prognosis for  improved oropharyngeal function:  (N/A)      Swallow Study   General Date of Onset: 12/30/22 HPI: Francis Cardenas is a 72 yo M who presented to the ED with worsening edema and shortness of breath for few months.  CXR 8/3: "Symmetric hazy density at the bases, history suggesting edema.  Stable cardiomegaly." Pt with history of chronic diastolic CHF,  morbid obesity, heavy alcohol use, hypertension, GERD, OSA, paroxysmal A-fib, prostate cancer, type 2 diabetes mellitus, COPD, CKD 3 AA, PTSD, depression, anxiety, gout, chronic pain Type of Study: Bedside Swallow Evaluation Previous Swallow Assessment: None Diet Prior to this Study: Regular;Thin liquids (Level 0) Temperature Spikes Noted: No History of Recent Intubation: No Behavior/Cognition: Alert;Cooperative;Pleasant mood Oral Cavity Assessment: Within Functional Limits Oral Care Completed by SLP: No Oral Cavity - Dentition: Adequate natural dentition (some dental work) Self-Feeding Abilities: Able to feed self Patient Positioning: Upright in chair Baseline Vocal Quality: Normal (reports hoarseness, raspiness since fall 09/14/22) Volitional Cough: Strong Volitional Swallow: Able to elicit    Oral/Motor/Sensory Function Overall Oral Motor/Sensory Function: Within functional limits Facial ROM: Within Functional Limits Facial Symmetry: Within Functional Limits Lingual ROM: Within Functional Limits Lingual Symmetry: Within Functional Limits Lingual Strength: Within Functional Limits Velum: Within Functional Limits Mandible: Within Functional Limits   Ice Chips Ice chips: Not tested   Thin Liquid Thin Liquid: Within functional limits    Nectar Thick Nectar Thick Liquid: Not tested   Honey Thick Honey Thick Liquid: Not tested   Puree Puree: Within functional limits Presentation: Spoon   Solid     Solid: Within functional limits Presentation: Self Fed      Kerrie Pleasure, MA, CCC-SLP Acute Rehabilitation Services Office: [phone] 12/30/2022,12:21 PM

## 2022-12-31 LAB — GLUCOSE, CAPILLARY
Glucose-Capillary: 178 mg/dL — ABNORMAL HIGH (ref 70–99)
Glucose-Capillary: 112 mg/dL — ABNORMAL HIGH (ref 70–99)
Glucose-Capillary: 139 mg/dL — ABNORMAL HIGH (ref 70–99)
Glucose-Capillary: 121 mg/dL — ABNORMAL HIGH (ref 70–99)

## 2022-12-31 NOTE — Progress Notes (Signed)
72 year old patient with diastolic CHF, morbid obesity, alcohol abuse, HTN, GERD, OSA, paroxysmal A-fib on Eliquis, prostate cancer, DM2, COPD, CKD stage IIIa, PTSD, depression, gout, chronic pain, anxiety admitted for volume overload diagnosed with diastolic CHF exacerbation.  Patient received aggressive medical care from 8/3 - 8/9.  Patient was also seen by cardiology.  PT/OT recommended SNF.  Currently awaiting placement.  Discharge summary has been completed by Dr. Jomarie Longs on 8/9.  Vital signs currently remained stable Sitting  up in the recliner.   No complaints  Stephania Fragmin MD [facility]

## 2022-12-31 NOTE — Plan of Care (Signed)
Reviewed plan of care with patient at bedside.  Patient has made progress with all hospital goals; tolerating diet, following fluid restrictions, compliant with all medications, ambulates frequently in room and endorses significant reduction in SOB and lower extremity swelling.  [employer] boots remain in place and are clean, dry and intact.  He is aware he is pending discharge to SNF once insurance authorization is confirmed.

## 2022-12-31 NOTE — Progress Notes (Signed)
Pt will need P2P, MD notified- [phone], option 5. Need name, DOB, member ID. Needs to be done by Monday at 12pm.

## 2022-12-31 NOTE — TOC Progression Note (Signed)
Transition of Care [facility]) - Progression Note    Patient Details  Name: Francis Cardenas MRN: [id number] Date of Birth: 01/30/1951  [facility] CM/SW Contact  Carmina Miller, [place] Phone Number: 12/31/2022, 10:57 AM  Clinical Narrative:     CSW called [facility], auth still pending, representative states there will more than likely be a Peer to Peer but it hasn't been fully decided.   Expected Discharge Plan: Skilled Nursing Facility Barriers to Discharge: Continued Medical Work up  Expected Discharge Plan and Services In-house Referral: Clinical Social Work     Living arrangements for the past 2 months: Single Family Home Expected Discharge Date: 12/30/22                                     Social Determinants of Health (SDOH) Interventions SDOH Screenings   Food Insecurity: No Food Insecurity (12/26/2022)  Housing: Low Risk  (12/26/2022)  Transportation Needs: No Transportation Needs (12/24/2022)  Utilities: Not At Risk (12/24/2022)  Alcohol Screen: Low Risk  (12/26/2022)  Financial Resource Strain: Low Risk  (12/26/2022)  Tobacco Use: Medium Risk (12/24/2022)    Readmission Risk Interventions    09/02/2021   11:32 AM  Readmission Risk Prevention Plan  Transportation Screening Complete  PCP or Specialist Appt within 3-5 Days --  HRI or Home Care Consult Complete  Palliative Care Screening Not Applicable  Medication Review (RN Care Manager) Complete

## 2023-01-01 DIAGNOSIS — I5033 Acute on chronic diastolic (congestive) heart failure: Secondary | ICD-10-CM | POA: Diagnosis not present

## 2023-01-01 LAB — GLUCOSE, CAPILLARY: Glucose-Capillary: 116 mg/dL — ABNORMAL HIGH (ref 70–99)

## 2023-01-01 MED ORDER — EMPAGLIFLOZIN 25 MG PO TABS: 25.0000 mg | ORAL_TABLET | Freq: Every day | ORAL | 0 refills | Status: AC

## 2023-01-01 MED ORDER — GABAPENTIN 300 MG PO CAPS: 300.0000 mg | ORAL_CAPSULE | Freq: Two times a day (BID) | ORAL | 0 refills | Status: AC

## 2023-01-01 MED ORDER — GABAPENTIN 300 MG PO CAPS: 300.0000 mg | ORAL_CAPSULE | Freq: Two times a day (BID) | ORAL | 0 refills | Status: DC

## 2023-01-01 NOTE — Discharge Summary (Signed)
Physician Discharge Summary  Francis Cardenas WGN:[id number] DOB: 08-Dec-1950 DOA: 12/24/2022  PCP: Benjiman Core, MD  Admit date: 12/24/2022 Discharge date: 01/01/2023  Admitted From: Home Disposition:  Home with HH; SNF denied  Recommendations for Outpatient Follow-up:  Going home, f/u with PCP in 1 week.  Repeat LABS at follow up  Discharge Condition: Stable CODE STATUS: DNR Diet recommendation: Heart Healthy.   Brief/Interim Summary: 71/M with history of chronic diastolic CHF, morbid obesity, heavy alcohol use, hypertension, GERD, OSA, paroxysmal A-fib, prostate cancer, type 2 diabetes mellitus, COPD, CKD 3 AA, PTSD, depression, anxiety, gout, chronic pain presented to the ED with worsening edema and shortness of breath for few months,.  He is on CHF meds including torsemide at baseline, reports compliance with this, has not followed up with cardiology in well over a year.  In the ED vitals stable, sodium 125, potassium 5.4, creatinine 2.3 up from baseline of 1.6, calcium of 10.7, hemoglobin 10.2, troponin negative, BNP 287, chest x-ray noted cardiomegaly and concern for pulmonary edema.  Upon admission patient was aggressively diuresed with Lasix and eventually transitioned to p.o. torsemide.  Echocardiogram showed EF of 60 to 65% with moderately enlarged cardiomyopathy.  Hospital course was also complicated by AKI but slowly improved.  Initial recommendations by physical therapy was SNF but patient's insurance declined SNF.  Peer to peer was performed by me on the day of discharge, Dr. Betti Cruz from insurance company declined SNF as patient was improving in terms of his ambulation and did not have any skilled needs.  At this time we will discharge patient home and will offer home health services.  Rest of the details are not Dr. Edison Simon discharge summary from 12/30/2022.    Discharge Diagnoses:  Active Problems:   Depressive disorder   Alcohol dependence (HCC)   PTSD  (post-traumatic stress disorder)   Benign essential hypertension   COPD with asthma   GERD (gastroesophageal reflux disease)   Mixed hyperlipidemia   Type 2 diabetes mellitus with obesity (HCC)   OSA (obstructive sleep apnea)   Morbid obesity (HCC)   Prostate cancer (HCC)   Persistent atrial fibrillation (HCC)   Acute renal failure superimposed on stage 3a chronic kidney disease (HCC)   Acute on chronic diastolic CHF (congestive heart failure) (HCC)   Chronic kidney disease, stage 3 (HCC)   Anxiety state   Chronic pain due to trauma   Acute exacerbation of CHF (congestive heart failure) (HCC)      Consultations: Physical therapy  Subjective: No complaints doing well, ambulating.  Denies any chest pain, shortness of breath.  Discharge Exam: Vitals:   12/31/22 2038 01/01/23 0323  BP: 128/71 127/67  Pulse:  (!) 46  Resp:  18  Temp:  97.7 F (36.5 C)  SpO2:  98%   Vitals:   12/31/22 2008 12/31/22 2035 12/31/22 2038 01/01/23 0323  BP:  128/71 128/71 127/67  Pulse: 61   (!) 46  Resp: 15 18  18   Temp:  97.7 F (36.5 C)  97.7 F (36.5 C)  TempSrc:  Oral  Oral  SpO2: 97%   98%  Weight:    134.2 kg  Height:        General: Pt is alert, awake, not in acute distress Cardiovascular: RRR, S1/S2 +, no rubs, no gallops Respiratory: CTA bilaterally, no wheezing, no rhonchi Abdominal: Soft, NT, ND, bowel sounds + Extremities: no edema, no cyanosis, minimal bilateral lower extremity swelling, compression dressing/Unna boots in place  Discharge Instructions  Discharge  Instructions     Diet - low sodium heart healthy   Complete by: As directed    Diet Carb Modified   Complete by: As directed    Increase activity slowly   Complete by: As directed       Allergies as of 01/01/2023       Reactions   Oxcarbazepine Other (See Comments)   Dizziness/ lightheaded   Zolpidem Nausea Only        Medication List     STOP taking these medications    amLODipine 10 MG  tablet Commonly known as: NORVASC   hydrALAZINE 25 MG tablet Commonly known as: APRESOLINE       TAKE these medications    acetaminophen 325 MG tablet Commonly known as: Tylenol Take 2 tablets (650 mg total) by mouth every 6 (six) hours as needed.   albuterol 108 (90 Base) MCG/ACT inhaler Commonly known as: VENTOLIN HFA Inhale 2 puffs into the lungs every 6 (six) hours as needed for wheezing.   allopurinol 100 MG tablet Commonly known as: ZYLOPRIM Take 100 mg by mouth daily. What changed: Another medication with the same name was removed. Continue taking this medication, and follow the directions you see here.   ALPRAZolam 1 MG tablet Commonly known as: XANAX Take 1 tablet (1 mg total) by mouth at bedtime.   apixaban 5 MG Tabs tablet Commonly known as: ELIQUIS Take 1 tablet (5 mg total) by mouth 2 (two) times daily.   atorvastatin 80 MG tablet Commonly known as: LIPITOR Take 40 mg by mouth daily with supper.   empagliflozin 25 MG Tabs tablet Commonly known as: Jardiance Take 1 tablet (25 mg total) by mouth daily. What changed:  how much to take when to take this   fluticasone-salmeterol 250-50 MCG/ACT Aepb Commonly known as: Wixela Inhub Inhale 1 puff into the lungs in the morning and at bedtime.   gabapentin 300 MG capsule Commonly known as: NEURONTIN Take 1 capsule (300 mg total) by mouth 2 (two) times daily. What changed: when to take this   Magnesium Oxide 420 MG Tabs Take 420 mg by mouth daily with supper.   metoprolol succinate 25 MG 24 hr tablet Commonly known as: TOPROL-XL Take 0.5 tablets (12.5 mg total) by mouth daily.   multivitamin with minerals tablet Take 1 tablet by mouth every Monday, Wednesday, and Friday.   omeprazole 20 MG capsule Commonly known as: PRILOSEC Take 20 mg by mouth daily with supper.   potassium chloride SA 20 MEQ tablet Commonly known as: KLOR-CON M Take 1 tablet (20 mEq total) by mouth daily with supper.   Spiriva  Respimat 2.5 MCG/ACT Aers Generic drug: Tiotropium Bromide Monohydrate Inhale 2 puffs into the lungs daily.   spironolactone 25 MG tablet Commonly known as: ALDACTONE Take 25 mg by mouth every morning.   thiamine 100 MG tablet Commonly known as: Vitamin B-1 Take 100 mg by mouth daily.   torsemide 20 MG tablet Commonly known as: DEMADEX Take 20 mg by mouth 2 (two) times daily.   vitamin B-12 500 MCG tablet Commonly known as: CYANOCOBALAMIN Take 500 mcg by mouth daily with supper.        Contact information for follow-up providers     [facility]. Go in 9 day(s).   Specialty: Cardiology Why: Hospital follow up 01/05/2023 @ 9 am PLEASE bring a current medication list to appointment FREE valet parking, , Entrance C, off [employer] information: [phone]  [address] [place] [address] [phone]        Benjiman Core, MD Follow up in 1 week(s).   Specialty: Internal Medicine Contact information: [address] [place] [address] [phone]              Contact information for after-discharge care     Destination     [facility] Preferred SNF .   Service: Skilled Nursing Contact information: [address] [place] [address] [phone]                    Allergies  Allergen Reactions   Oxcarbazepine Other (See Comments)    Dizziness/ lightheaded   Zolpidem Nausea Only    You were cared for by a hospitalist during your hospital stay. If you have any questions about your discharge medications or the care you received while you were in the hospital after you are discharged, you can call the unit and asked to speak with the hospitalist on call if the hospitalist that took care of you is not available. Once you are discharged, your primary care physician will handle any further medical issues. Please note that  no refills for any discharge medications will be authorized once you are discharged, as it is imperative that you return to your primary care physician (or establish a relationship with a primary care physician if you do not have one) for your aftercare needs so that they can reassess your need for medications and monitor your lab values.  You were cared for by a hospitalist during your hospital stay. If you have any questions about your discharge medications or the care you received while you were in the hospital after you are discharged, you can call the unit and asked to speak with the hospitalist on call if the hospitalist that took care of you is not available. Once you are discharged, your primary care physician will handle any further medical issues. Please note that NO REFILLS for any discharge medications will be authorized once you are discharged, as it is imperative that you return to your primary care physician (or establish a relationship with a primary care physician if you do not have one) for your aftercare needs so that they can reassess your need for medications and monitor your lab values.  Please request your Prim.MD to go over all Hospital Tests and Procedure/Radiological results at the follow up, please get all Hospital records sent to your Prim MD by signing hospital release before you go home.  Get CBC, CMP, 2 view Chest X ray checked  by Primary MD during your next visit or SNF MD in 5-7 days ( we routinely change or add medications that can affect your baseline labs and fluid status, therefore we recommend that you get the mentioned basic workup next visit with your PCP, your PCP may decide not to get them or add new tests based on their clinical decision)  On your next visit with your primary care physician please Get Medicines reviewed and adjusted.  If you experience worsening of your admission symptoms, develop shortness of breath, life threatening emergency, suicidal or  homicidal thoughts you must seek medical attention immediately by calling 911 or calling your MD immediately  if symptoms less severe.  You Must read complete instructions/literature along with all the possible adverse reactions/side effects for all the Medicines you take and that have been prescribed to you. Take any new Medicines after you have completely  understood and accpet all the possible adverse reactions/side effects.   Do not drive, operate heavy machinery, perform activities at heights, swimming or participation in water activities or provide baby sitting services if your were admitted for syncope or siezures until you have seen by Primary MD or a Neurologist and advised to do so again.  Do not drive when taking Pain medications.   Procedures/Studies: ECHOCARDIOGRAM COMPLETE  Result Date: 12/25/2022    ECHOCARDIOGRAM REPORT   Patient Name:   Francis Cardenas Date of Exam: 12/25/2022 Medical Rec #:  [id number]      Height:       70.0 in Accession #:    [id number]     Weight:       304.2 lb Date of Birth:  1950/12/21     BSA:          2.495 m Patient Age:    71 years       BP:           106/44 mmHg Patient Gender: M              HR:           60 bpm. Exam Location:  Inpatient Procedure: 2D Echo, Cardiac Doppler, Color Doppler and Intracardiac            Opacification Agent Indications:    CHF-Acute Diastolic I50.31  History:        Patient has prior history of Echocardiogram examinations, most                 recent 08/31/2021. CHF, COPD; Risk Factors:Sleep Apnea, Diabetes,                 Former Smoker and Dyslipidemia.  Sonographer:    Dondra Prader RVT RCS Referring Phys: [id number] Cecille Po [facility]  Sonographer Comments: Technically difficult study due to poor echo windows and patient is obese. IMPRESSIONS  1. Left ventricular ejection fraction, by estimation, is 60 to 65%. The left ventricle has normal function. The left ventricle has no regional wall motion abnormalities. There is mild left  ventricular hypertrophy. Left ventricular diastolic parameters are indeterminate.  2. Right ventricular systolic function is mildly reduced. The right ventricular size is moderately enlarged. There is mildly elevated pulmonary artery systolic pressure. The estimated right ventricular systolic pressure is 40.5 mmHg.  3. Left atrial size was moderately dilated.  4. Right atrial size was mildly dilated.  5. A small pericardial effusion is present. The pericardial effusion is anterior to the right ventricle.  6. The mitral valve is grossly normal. Trivial mitral valve regurgitation. No evidence of mitral stenosis.  7. The aortic valve is tricuspid. Aortic valve regurgitation is not visualized. Aortic valve sclerosis/calcification is present, without any evidence of aortic stenosis.  8. The inferior vena cava is dilated in size with >50% respiratory variability, suggesting right atrial pressure of 8 mmHg. FINDINGS  Left Ventricle: Left ventricular ejection fraction, by estimation, is 60 to 65%. The left ventricle has normal function. The left ventricle has no regional wall motion abnormalities. Definity contrast agent was given IV to delineate the left ventricular  endocardial borders. The left ventricular internal cavity size was normal in size. There is mild left ventricular hypertrophy. Left ventricular diastolic parameters are indeterminate. Right Ventricle: The right ventricular size is moderately enlarged. No increase in right ventricular wall thickness. Right ventricular systolic function is mildly reduced. There is mildly elevated pulmonary artery systolic pressure. The tricuspid regurgitant velocity is 2.85  m/s, and with an assumed right atrial pressure of 8 mmHg, the estimated right ventricular systolic pressure is 40.5 mmHg. Left Atrium: Left atrial size was moderately dilated. Right Atrium: Right atrial size was mildly dilated. Pericardium: A small pericardial effusion is present. The pericardial effusion is  anterior to the right ventricle. Presence of epicardial fat layer. Mitral Valve: The mitral valve is grossly normal. Trivial mitral valve regurgitation. No evidence of mitral valve stenosis. Tricuspid Valve: The tricuspid valve is normal in structure. Tricuspid valve regurgitation is trivial. No evidence of tricuspid stenosis. Aortic Valve: The aortic valve is tricuspid. Aortic valve regurgitation is not visualized. Aortic valve sclerosis/calcification is present, without any evidence of aortic stenosis. Aortic valve mean gradient measures 5.0 mmHg. Aortic valve peak gradient measures 9.4 mmHg. Aortic valve area, by VTI measures 3.61 cm. Pulmonic Valve: The pulmonic valve was not well visualized. Pulmonic valve regurgitation is not visualized. No evidence of pulmonic stenosis. Aorta: The aortic root is normal in size and structure. Ascending aorta measurements are within normal limits for age when indexed to body surface area. Venous: The inferior vena cava is dilated in size with greater than 50% respiratory variability, suggesting right atrial pressure of 8 mmHg. IAS/Shunts: No atrial level shunt detected by color flow Doppler.  LEFT VENTRICLE PLAX 2D LVIDd:         5.40 cm   Diastology LVIDs:         2.80 cm   LV e' medial: 9.73 cm/s LV PW:         1.30 cm LV IVS:        1.00 cm LVOT diam:     2.30 cm LV SV:         118 LV SV Index:   47 LVOT Area:     4.15 cm  RIGHT VENTRICLE             IVC RV Basal diam:  4.40 cm     IVC diam: 2.50 cm RV S prime:     15.30 cm/s TAPSE (M-mode): 2.5 cm LEFT ATRIUM              Index        RIGHT ATRIUM           Index LA diam:        4.30 cm  1.72 cm/m   RA Area:     23.90 cm LA Vol (A2C):   118.0 ml 47.30 ml/m  RA Volume:   77.40 ml  31.03 ml/m LA Vol (A4C):   104.0 ml 41.69 ml/m LA Biplane Vol: 116.0 ml 46.50 ml/m  AORTIC VALVE                     PULMONIC VALVE AV Area (Vmax):    3.49 cm      PV Vmax:       0.81 m/s AV Area (Vmean):   3.40 cm      PV Peak grad:  2.7  mmHg AV Area (VTI):     3.61 cm AV Vmax:           153.50 cm/s AV Vmean:          103.000 cm/s AV VTI:            0.326 m AV Peak Grad:      9.4 mmHg AV Mean Grad:      5.0 mmHg LVOT Vmax:         129.00 cm/s LVOT  Vmean:        84.200 cm/s LVOT VTI:          0.283 m LVOT/AV VTI ratio: 0.87  AORTA Ao Root diam: 3.10 cm Ao Asc diam:  3.80 cm TRICUSPID VALVE TR Peak grad:   32.5 mmHg TR Vmax:        285.00 cm/s  SHUNTS Systemic VTI:  0.28 m Systemic Diam: 2.30 cm Weston Brass MD Electronically signed by Weston Brass MD Signature Date/Time: 12/25/2022/10:47:22 AM    Final    DG Chest Port 1 View  Result Date: 12/24/2022 CLINICAL DATA:  Shortness of breath, edema. Leg swelling for 2 months with orthopnea. EXAM: PORTABLE CHEST 1 VIEW COMPARISON:  10/11/2022 FINDINGS: Hazy density at the bases. Chronic cardiomegaly. There could be a hiatal hernia with gas bubble projecting over the left lung base. Stable central vessels. The left lateral costophrenic sulcus is excluded from view. No visible pleural fluid. No pneumothorax. IMPRESSION: Symmetric hazy density at the bases, history suggesting edema. Stable cardiomegaly. Electronically Signed   By: Tiburcio Pea M.D.   On: 12/24/2022 05:01     The results of significant diagnostics from this hospitalization (including imaging, microbiology, ancillary and laboratory) are listed below for reference.     Microbiology: Recent Results (from the past 240 hour(s))  MRSA Next Gen by PCR, Nasal     Status: Abnormal   Collection Time: 12/24/22  1:53 PM   Specimen: Nasal Mucosa; Nasal Swab  Result Value Ref Range Status   MRSA by PCR Next Gen DETECTED (A) NOT DETECTED Final    Comment: CRITICAL RESULT CALLED TO, READ BACK BY AND VERIFIED WITH: RN Donia Ast [id number] BY J RAZZAK, MT (NOTE) The GeneXpert MRSA Assay (FDA approved for NASAL specimens only), is one component of a comprehensive MRSA colonization surveillance program. It is not intended to  diagnose MRSA infection nor to guide or monitor treatment for MRSA infections. Test performance is not FDA approved in patients less than 88 years old. Performed at [facility], [address] [place], [place] [address]      Labs: BNP (last 3 results) Recent Labs    04/05/22 1444 06/14/22 2004 12/24/22 0414  BNP 157.8* 129.7* 287.3*   Basic Metabolic Panel: Recent Labs  Lab 12/26/22 0451 12/27/22 0334 12/28/22 0747 12/29/22 0124 12/30/22 0706  NA 129* 131* 135 135 140  K 4.7 4.6 4.3 3.9 3.9  CL 94* 96* 98 98 102  CO2 24 26 23 25 26   GLUCOSE 124* 127* 153* 124* 121*  BUN 63* 60* 58* 59* 49*  CREATININE 1.95* 1.95* 1.76* 1.68* 1.75*  CALCIUM 10.4* 10.6* 11.0* 10.8* 11.1*   Liver Function Tests: No results for input(s): "AST", "ALT", "ALKPHOS", "BILITOT", "PROT", "ALBUMIN" in the last 168 hours. No results for input(s): "LIPASE", "AMYLASE" in the last 168 hours. No results for input(s): "AMMONIA" in the last 168 hours. CBC: Recent Labs  Lab 12/26/22 0451  WBC 6.1  HGB 10.2*  HCT 31.2*  MCV 93.4  PLT 151   Cardiac Enzymes: No results for input(s): "CKTOTAL", "CKMB", "CKMBINDEX", "TROPONINI" in the last 168 hours. BNP: Invalid input(s): "POCBNP" CBG: Recent Labs  Lab 12/31/22 0736 12/31/22 1155 12/31/22 1530 12/31/22 2207 01/01/23 0747  GLUCAP 178* 112* 139* 121* 116*   D-Dimer No results for input(s): "DDIMER" in the last 72 hours. Hgb A1c No results for input(s): "HGBA1C" in the last 72 hours. Lipid Profile No results for input(s): "CHOL", "HDL", "LDLCALC", "TRIG", "  CHOLHDL", "LDLDIRECT" in the last 72 hours. Thyroid function studies No results for input(s): "TSH", "T4TOTAL", "T3FREE", "THYROIDAB" in the last 72 hours.  Invalid input(s): "FREET3" Anemia work up No results for input(s): "VITAMINB12", "FOLATE", "FERRITIN", "TIBC", "IRON", "RETICCTPCT" in the last 72 hours. Urinalysis    Component Value Date/Time   COLORURINE COLORLESS  (A) 12/24/2022 0628   APPEARANCEUR CLEAR 12/24/2022 0628   LABSPEC 1.007 12/24/2022 0628   PHURINE 5.5 12/24/2022 0628   GLUCOSEU NEGATIVE 12/24/2022 0628   HGBUR NEGATIVE 12/24/2022 0628   BILIRUBINUR NEGATIVE 12/24/2022 0628   KETONESUR NEGATIVE 12/24/2022 0628   PROTEINUR NEGATIVE 12/24/2022 0628   UROBILINOGEN 0.2 03/24/2013 1255   NITRITE NEGATIVE 12/24/2022 0628   LEUKOCYTESUR NEGATIVE 12/24/2022 0628   Sepsis Labs Recent Labs  Lab 12/26/22 0451  WBC 6.1   Microbiology Recent Results (from the past 240 hour(s))  MRSA Next Gen by PCR, Nasal     Status: Abnormal   Collection Time: 12/24/22  1:53 PM   Specimen: Nasal Mucosa; Nasal Swab  Result Value Ref Range Status   MRSA by PCR Next Gen DETECTED (A) NOT DETECTED Final    Comment: CRITICAL RESULT CALLED TO, READ BACK BY AND VERIFIED WITH: RN Donia Ast [id number] 1723 BY J RAZZAK, MT (NOTE) The GeneXpert MRSA Assay (FDA approved for NASAL specimens only), is one component of a comprehensive MRSA colonization surveillance program. It is not intended to diagnose MRSA infection nor to guide or monitor treatment for MRSA infections. Test performance is not FDA approved in patients less than 69 years old. Performed at [facility], [address], [place], [place] [address]      Time coordinating discharge:  I have spent 35 minutes face to face with the patient and on the ward discussing the patients care, assessment, plan and disposition with other care givers. >50% of the time was devoted counseling the patient about the risks and benefits of treatment/Discharge disposition and coordinating care.   SIGNED:   Dimple Nanas, MD  [facility] 01/01/2023, 9:01 AM   If 7PM-7AM, please contact night-coverage

## 2023-01-01 NOTE — TOC Transition Note (Signed)
Transition of Care [facility]) - CM/SW Discharge Note   Patient Details  Name: Francis Cardenas MRN: [id number] Date of Birth: 12-16-1950  Transition of [facility]) CM/SW Contact:  Ronny Bacon, RN Phone Number: 01/01/2023, 10:10 AM   Clinical Narrative: Patient being discharged home today. Patient upset that he was denied admission to SNF. Discussed HH PT/OT, initially patient not open to the idea. Patient reports being denied for SNF in the past as well. Patient expresses that he tried [facility] services in the past and declined use after 3 weeks. He felt they didn't come to the house enough and that he does not have equipment at home for therapy services. Patient talked about having membership in the silver sneaker program at the [facility] on [address] but they wanted him to go to a dance class. Discussed other services at the [facility] patient could use. Patient mentioned exercising in the pool. We discussed the option of getting HH services to teach exercises he can do at home and at the [facility] on days when [facility] is not visiting. Patient open to the idea and also wants to look into [facility]. HH PT/OT/Aide arranged through [facility]. [facility] request for [facility] services emailed to [email or web address].    Final next level of care: Home w Home Health Services Barriers to Discharge: No Barriers Identified   Patient Goals and CMS Choice   Choice offered to / list presented to : Patient  Discharge Placement                         Discharge Plan and Services Additional resources added to the After Visit Summary for   In-house Referral: Clinical Social Work                        HH Arranged: PT, OT, Nurse's Aide HH Agency: [facility] Date [facility] Agency Contacted: 01/01/23 Time HH Agency Contacted: [phone] Representative spoke with at [facility] Agency: Kandee Keen  Social Determinants of Health (SDOH) Interventions SDOH Screenings   Food Insecurity: No Food Insecurity  (12/26/2022)  Housing: Low Risk  (12/26/2022)  Transportation Needs: No Transportation Needs (12/24/2022)  Utilities: Not At Risk (12/24/2022)  Alcohol Screen: Low Risk  (12/26/2022)  Financial Resource Strain: Low Risk  (12/26/2022)  Tobacco Use: Medium Risk (12/24/2022)     Readmission Risk Interventions    09/02/2021   11:32 AM  Readmission Risk Prevention Plan  Transportation Screening Complete  PCP or Specialist Appt within 3-5 Days --  HRI or Home Care Consult Complete  Palliative Care Screening Not Applicable  Medication Review (RN Care Manager) Complete

## 2023-01-05 ENCOUNTER — Encounter (HOSPITAL_COMMUNITY): Payer: No Typology Code available for payment source

## 2023-01-13 ENCOUNTER — Emergency Department (HOSPITAL_COMMUNITY): Payer: No Typology Code available for payment source

## 2023-01-13 ENCOUNTER — Emergency Department (HOSPITAL_COMMUNITY)
Admission: EM | Admit: 2023-01-13 | Discharge: 2023-01-18 | Disposition: A | Discharge status: Home / Self Care | Payer: No Typology Code available for payment source | Attending: Emergency Medicine | Admitting: Emergency Medicine

## 2023-01-13 ENCOUNTER — Other Ambulatory Visit: Payer: Self-pay

## 2023-01-13 ENCOUNTER — Encounter (HOSPITAL_COMMUNITY): Payer: Self-pay | Admitting: Emergency Medicine

## 2023-01-13 DIAGNOSIS — E119 Type 2 diabetes mellitus without complications: Secondary | ICD-10-CM | POA: Insufficient documentation

## 2023-01-13 DIAGNOSIS — S51811A Laceration without foreign body of right forearm, initial encounter: Secondary | ICD-10-CM | POA: Diagnosis present

## 2023-01-13 DIAGNOSIS — Y901 Blood alcohol level of 20-39 mg/100 ml: Secondary | ICD-10-CM | POA: Diagnosis not present

## 2023-01-13 DIAGNOSIS — R296 Repeated falls: Secondary | ICD-10-CM

## 2023-01-13 DIAGNOSIS — I509 Heart failure, unspecified: Secondary | ICD-10-CM | POA: Insufficient documentation

## 2023-01-13 DIAGNOSIS — S51012A Laceration without foreign body of left elbow, initial encounter: Secondary | ICD-10-CM | POA: Insufficient documentation

## 2023-01-13 DIAGNOSIS — W19XXXA Unspecified fall, initial encounter: Secondary | ICD-10-CM | POA: Insufficient documentation

## 2023-01-13 DIAGNOSIS — Z7984 Long term (current) use of oral hypoglycemic drugs: Secondary | ICD-10-CM | POA: Insufficient documentation

## 2023-01-13 DIAGNOSIS — Z7901 Long term (current) use of anticoagulants: Secondary | ICD-10-CM | POA: Insufficient documentation

## 2023-01-13 DIAGNOSIS — Z79899 Other long term (current) drug therapy: Secondary | ICD-10-CM | POA: Diagnosis not present

## 2023-01-13 DIAGNOSIS — I498 Other specified cardiac arrhythmias: Secondary | ICD-10-CM | POA: Diagnosis not present

## 2023-01-13 DIAGNOSIS — S51812A Laceration without foreign body of left forearm, initial encounter: Secondary | ICD-10-CM | POA: Diagnosis not present

## 2023-01-13 DIAGNOSIS — F1010 Alcohol abuse, uncomplicated: Secondary | ICD-10-CM | POA: Insufficient documentation

## 2023-01-13 DIAGNOSIS — Y9301 Activity, walking, marching and hiking: Secondary | ICD-10-CM | POA: Diagnosis not present

## 2023-01-13 DIAGNOSIS — J449 Chronic obstructive pulmonary disease, unspecified: Secondary | ICD-10-CM | POA: Insufficient documentation

## 2023-01-13 DIAGNOSIS — R55 Syncope and collapse: Secondary | ICD-10-CM | POA: Diagnosis not present

## 2023-01-13 DIAGNOSIS — I110 Hypertensive heart disease with heart failure: Secondary | ICD-10-CM | POA: Diagnosis not present

## 2023-01-13 DIAGNOSIS — Z7951 Long term (current) use of inhaled steroids: Secondary | ICD-10-CM | POA: Insufficient documentation

## 2023-01-13 LAB — URINALYSIS, ROUTINE W REFLEX MICROSCOPIC
Bilirubin Urine: NEGATIVE
Glucose, UA: 50 mg/dL — AB
Hgb urine dipstick: NEGATIVE
Ketones, ur: NEGATIVE mg/dL
Leukocytes,Ua: NEGATIVE
Nitrite: NEGATIVE
Protein, ur: NEGATIVE mg/dL
Specific Gravity, Urine: 1.004 — ABNORMAL LOW (ref 1.005–1.030)
pH: 6 (ref 5.0–8.0)

## 2023-01-13 LAB — CBC WITH DIFFERENTIAL/PLATELET
Abs Immature Granulocytes: 0.12 10*3/uL — ABNORMAL HIGH (ref 0.00–0.07)
Basophils Absolute: 0.1 10*3/uL (ref 0.0–0.1)
Basophils Relative: 1 %
Eosinophils Absolute: 0.4 10*3/uL (ref 0.0–0.5)
Eosinophils Relative: 6 %
HCT: 37.6 % — ABNORMAL LOW (ref 39.0–52.0)
Hemoglobin: 11.8 g/dL — ABNORMAL LOW (ref 13.0–17.0)
Immature Granulocytes: 2 %
Lymphocytes Relative: 16 %
Lymphs Abs: 1.1 10*3/uL (ref 0.7–4.0)
MCH: 29.9 pg (ref 26.0–34.0)
MCHC: 31.4 g/dL (ref 30.0–36.0)
MCV: 95.2 fL (ref 80.0–100.0)
Monocytes Absolute: 0.5 10*3/uL (ref 0.1–1.0)
Monocytes Relative: 7 %
Neutro Abs: 4.7 10*3/uL (ref 1.7–7.7)
Neutrophils Relative %: 68 %
Platelets: 189 10*3/uL (ref 150–400)
RBC: 3.95 MIL/uL — ABNORMAL LOW (ref 4.22–5.81)
RDW: 15.4 % (ref 11.5–15.5)
WBC: 7 10*3/uL (ref 4.0–10.5)
nRBC: 0 % (ref 0.0–0.2)

## 2023-01-13 LAB — COMPREHENSIVE METABOLIC PANEL
ALT: 36 U/L (ref 0–44)
AST: 26 U/L (ref 15–41)
Albumin: 3.9 g/dL (ref 3.5–5.0)
Alkaline Phosphatase: 81 U/L (ref 38–126)
Anion gap: 16 — ABNORMAL HIGH (ref 5–15)
BUN: 41 mg/dL — ABNORMAL HIGH (ref 8–23)
CO2: 20 mmol/L — ABNORMAL LOW (ref 22–32)
Calcium: 10.3 mg/dL (ref 8.9–10.3)
Chloride: 101 mmol/L (ref 98–111)
Creatinine, Ser: 1.5 mg/dL — ABNORMAL HIGH (ref 0.61–1.24)
GFR, Estimated: 49 mL/min — ABNORMAL LOW (ref >60)
Glucose, Bld: 80 mg/dL (ref 70–99)
Potassium: 4 mmol/L (ref 3.5–5.1)
Sodium: 137 mmol/L (ref 135–145)
Total Bilirubin: 0.7 mg/dL (ref 0.3–1.2)
Total Protein: 6.4 g/dL — ABNORMAL LOW (ref 6.5–8.1)

## 2023-01-13 LAB — MAGNESIUM: Magnesium: 2.1 mg/dL (ref 1.7–2.4)

## 2023-01-13 LAB — ETHANOL: Alcohol, Ethyl (B): 27 mg/dL — ABNORMAL HIGH (ref <10)

## 2023-01-13 LAB — CBG MONITORING, ED: Glucose-Capillary: 87 mg/dL (ref 70–99)

## 2023-01-13 MED ORDER — APIXABAN 5 MG PO TABS
5.0000 mg | ORAL_TABLET | Freq: Two times a day (BID) | ORAL | Status: DC
  Administered 2023-01-13 – 2023-01-17 (×9): 5 mg via ORAL
  Filled 2023-01-13 (×9): qty 1

## 2023-01-13 MED ORDER — CARVEDILOL 3.125 MG PO TABS
6.2500 mg | ORAL_TABLET | Freq: Two times a day (BID) | ORAL | Status: DC
  Administered 2023-01-14 – 2023-01-17 (×7): 6.25 mg via ORAL
  Filled 2023-01-13 (×7): qty 2

## 2023-01-13 MED ORDER — SPIRONOLACTONE 25 MG PO TABS
25.0000 mg | ORAL_TABLET | Freq: Every morning | ORAL | Status: DC
  Administered 2023-01-14 – 2023-01-17 (×4): 25 mg via ORAL
  Filled 2023-01-13 (×5): qty 1

## 2023-01-13 MED ORDER — ATORVASTATIN CALCIUM 40 MG PO TABS
40.0000 mg | ORAL_TABLET | Freq: Every day | ORAL | Status: DC
  Administered 2023-01-14 – 2023-01-17 (×4): 40 mg via ORAL
  Filled 2023-01-13 (×4): qty 1

## 2023-01-13 MED ORDER — LISINOPRIL 20 MG PO TABS
40.0000 mg | ORAL_TABLET | Freq: Every day | ORAL | Status: DC
  Administered 2023-01-14 – 2023-01-16 (×3): 40 mg via ORAL
  Filled 2023-01-13 (×4): qty 2

## 2023-01-13 MED ORDER — VITAMIN B-12 1000 MCG PO TABS
500.0000 ug | ORAL_TABLET | Freq: Every day | ORAL | Status: DC
  Administered 2023-01-14 – 2023-01-17 (×4): 500 ug via ORAL
  Filled 2023-01-13 (×4): qty 1

## 2023-01-13 MED ORDER — VITAMIN B-12 1000 MCG PO TABS
500.0000 ug | ORAL_TABLET | Freq: Once | ORAL | Status: AC
  Administered 2023-01-13: 500 ug via ORAL
  Filled 2023-01-13: qty 1

## 2023-01-13 MED ORDER — SODIUM CHLORIDE 0.9 % IV BOLUS
500.0000 mL | Freq: Once | INTRAVENOUS | Status: AC
  Administered 2023-01-13: 500 mL via INTRAVENOUS

## 2023-01-13 MED ORDER — AMLODIPINE BESYLATE 5 MG PO TABS
10.0000 mg | ORAL_TABLET | Freq: Every day | ORAL | Status: DC
  Administered 2023-01-14 – 2023-01-16 (×3): 10 mg via ORAL
  Filled 2023-01-13 (×4): qty 2

## 2023-01-13 MED ORDER — CARVEDILOL 3.125 MG PO TABS
6.2500 mg | ORAL_TABLET | Freq: Once | ORAL | Status: AC
  Administered 2023-01-13: 6.25 mg via ORAL
  Filled 2023-01-13: qty 2

## 2023-01-13 MED ORDER — GUAIFENESIN 200 MG PO TABS: 400.0000 mg | ORAL_TABLET | Freq: Two times a day (BID) | ORAL | Status: DC | PRN

## 2023-01-13 MED ORDER — ATORVASTATIN CALCIUM 40 MG PO TABS
40.0000 mg | ORAL_TABLET | Freq: Once | ORAL | Status: AC
  Administered 2023-01-13: 40 mg via ORAL
  Filled 2023-01-13: qty 1

## 2023-01-13 MED ORDER — EMPAGLIFLOZIN 25 MG PO TABS
25.0000 mg | ORAL_TABLET | Freq: Every day | ORAL | Status: DC
  Administered 2023-01-14 – 2023-01-17 (×4): 25 mg via ORAL
  Filled 2023-01-13 (×8): qty 1

## 2023-01-13 MED ORDER — TORSEMIDE 20 MG PO TABS
20.0000 mg | ORAL_TABLET | Freq: Two times a day (BID) | ORAL | Status: DC
  Administered 2023-01-14 – 2023-01-17 (×7): 20 mg via ORAL
  Filled 2023-01-13 (×7): qty 1

## 2023-01-13 MED ORDER — LISINOPRIL 20 MG PO TABS: 40.0000 mg | ORAL_TABLET | ORAL | Status: DC

## 2023-01-13 MED ORDER — TORSEMIDE 20 MG PO TABS
20.0000 mg | ORAL_TABLET | Freq: Once | ORAL | Status: AC
  Administered 2023-01-13: 20 mg via ORAL
  Filled 2023-01-13: qty 1

## 2023-01-13 NOTE — ED Provider Notes (Signed)
[place] EMERGENCY DEPARTMENT AT [facility] Provider Note   CSN: [id number] Arrival date & time: 01/13/23  1407     History  Chief Complaint  Patient presents with   Loss of Consciousness    Francis Cardenas is a 72 y.o. male.  Pt is a 72 yo male with pmhx significant for CHF, dm, htn, hld, bph, copd, and sleep apnea.  Pt said he had another fall today.  He has had several falls in the last few months.  He was admitted from 8/3-8/11 for fall and chf.  SNF was recommended, but his insurance declined.  Hospitalists even did a face to face with the insurance doc, but it was still declined.  Pt still feels unsteady.  He got up and was walking to the bathroom and felt like his brain was not connecting with his legs.  He felt like he had no control and he fell.  He was walking with his walker.  He did sustain a skin tear to both forearms.  He does not feel like anything is broken.  He is on Eliquis for afib, but denies hitting his head.  He does admit to drinking 4 beers this am.  He said he usually drinks in the morning, so this is not unusual for him.  He denies f/c.  He does not think he passed out.       Home Medications Prior to Admission medications   Medication Sig Start Date End Date Taking? Authorizing Provider  acetaminophen (TYLENOL) 325 MG tablet Take 2 tablets (650 mg total) by mouth every 6 (six) hours as needed. 09/27/21   Sloan Leiter, DO  albuterol (PROVENTIL HFA;VENTOLIN HFA) 108 (90 BASE) MCG/ACT inhaler Inhale 2 puffs into the lungs every 6 (six) hours as needed for wheezing.    [provider]  allopurinol (ZYLOPRIM) 100 MG tablet Take 100 mg by mouth daily. 05/07/22   [provider]  ALPRAZolam Prudy Feeler) 1 MG tablet Take 1 tablet (1 mg total) by mouth at bedtime. 12/30/22   Zannie Cove, MD  apixaban (ELIQUIS) 5 MG TABS tablet Take 1 tablet (5 mg total) by mouth 2 (two) times daily. 02/28/22   Jodelle Red, MD  atorvastatin  (LIPITOR) 80 MG tablet Take 40 mg by mouth daily with supper.    [provider]  empagliflozin (JARDIANCE) 25 MG TABS tablet Take 1 tablet (25 mg total) by mouth daily. 01/01/23   Amin, Ankit C, MD  fluticasone-salmeterol (WIXELA INHUB) 250-50 MCG/ACT AEPB Inhale 1 puff into the lungs in the morning and at bedtime. 09/05/22   Luciano Cutter, MD  gabapentin (NEURONTIN) 300 MG capsule Take 1 capsule (300 mg total) by mouth 2 (two) times daily. 01/01/23   Amin, Ankit C, MD  Magnesium Oxide 420 MG TABS Take 420 mg by mouth daily with supper.    [provider]  metoprolol succinate (TOPROL-XL) 25 MG 24 hr tablet Take 0.5 tablets (12.5 mg total) by mouth daily. 09/04/21   Lonia Blood, MD  Multiple Vitamins-Minerals (MULTIVITAMIN WITH MINERALS) tablet Take 1 tablet by mouth every Monday, Wednesday, and Friday.    [provider]  omeprazole (PRILOSEC) 20 MG capsule Take 20 mg by mouth daily with supper.    [provider]  potassium chloride SA (KLOR-CON M) 20 MEQ tablet Take 1 tablet (20 mEq total) by mouth daily with supper. 05/20/21   Lorin Glass, MD  spironolactone (ALDACTONE) 25 MG tablet Take 25 mg by  mouth every morning.    [provider]  thiamine (VITAMIN B-1) 100 MG tablet Take 100 mg by mouth daily.    [provider]  Tiotropium Bromide Monohydrate (SPIRIVA RESPIMAT) 2.5 MCG/ACT AERS Inhale 2 puffs into the lungs daily. 09/05/22   Luciano Cutter, MD  torsemide (DEMADEX) 20 MG tablet Take 20 mg by mouth 2 (two) times daily. 10/21/21   [provider]  vitamin B-12 (CYANOCOBALAMIN) 500 MCG tablet Take 500 mcg by mouth daily with supper.    [provider]      Allergies    Oxcarbazepine and Zolpidem    Review of Systems   Review of Systems  Skin:  Positive for wound.  All other systems reviewed and are negative.   Physical Exam Updated Vital Signs BP 132/68   Pulse (!) 53   Temp (!) 97.5 F (36.4 C)  (Oral)   Resp 19   Ht 5\' 10"  (1.778 m)   Wt 135 kg   SpO2 100%   BMI 42.70 kg/m  Physical Exam Vitals and nursing note reviewed.  Constitutional:      Appearance: Normal appearance. He is obese.  HENT:     Head: Normocephalic and atraumatic.     Right Ear: External ear normal.     Left Ear: External ear normal.     Nose: Nose normal.     Mouth/Throat:     Mouth: Mucous membranes are moist.     Pharynx: Oropharynx is clear.  Eyes:     Extraocular Movements: Extraocular movements intact.     Conjunctiva/sclera: Conjunctivae normal.     Pupils: Pupils are equal, round, and reactive to light.  Cardiovascular:     Rate and Rhythm: Normal rate. Rhythm irregular.     Pulses: Normal pulses.     Heart sounds: Normal heart sounds.  Pulmonary:     Effort: Pulmonary effort is normal.     Breath sounds: Normal breath sounds.  Abdominal:     General: Abdomen is flat. Bowel sounds are normal.     Palpations: Abdomen is soft.  Musculoskeletal:        General: Normal range of motion.     Cervical back: Normal range of motion and neck supple.  Skin:    General: Skin is warm.     Capillary Refill: Capillary refill takes less than 2 seconds.     Comments: Small skin tears to both forearms  Neurological:     General: No focal deficit present.     Mental Status: He is alert and oriented to person, place, and time.  Psychiatric:        Mood and Affect: Mood normal.        Behavior: Behavior normal.     ED Results / Procedures / Treatments   Labs (all labs ordered are listed, but only abnormal results are displayed) Labs Reviewed  CBC WITH DIFFERENTIAL/PLATELET  COMPREHENSIVE METABOLIC PANEL  URINALYSIS, ROUTINE W REFLEX MICROSCOPIC  ETHANOL  MAGNESIUM  CBG MONITORING, ED    EKG EKG Interpretation Date/Time:  Friday January 13 2023 14:25:29 EDT Ventricular Rate:  54 PR Interval:    QRS Duration:  122 QT Interval:  428 QTC Calculation: 406 R Axis:   84  Text  Interpretation: Atrial fibrillation Nonspecific intraventricular conduction delay No significant change since last tracing Confirmed by Jacalyn Lefevre [phone]) on 01/13/2023 2:46:56 PM  Radiology No results found.  Procedures Procedures    Medications Ordered in ED Medications  sodium  chloride 0.9 % bolus 500 mL (500 mLs Intravenous New Bag/Given 01/13/23 1434)    ED Course/ Medical Decision Making/ A&P                                 Medical Decision Making Amount and/or Complexity of Data Reviewed Labs: ordered. Radiology: ordered. ECG/medicine tests: ordered.   This patient presents to the ED for concern of fall, this involves an extensive number of treatment options, and is a complaint that carries with it a high risk of complications and morbidity.  The differential diagnosis includes cva, electrolyte abn, etoh intox, infection   Co morbidities that complicate the patient evaluation  CHF, dm, htn, hld, bph, copd, and sleep apnea   Additional history obtained:  Additional history obtained from epic chart review External records from outside source obtained and reviewed including EMS report   Lab Tests:  I Ordered, and personally interpreted labs.  The pertinent results include:  pending at shift change   Imaging Studies ordered:  I ordered imaging studies including cxr and mri brain  I independently visualized and interpreted imaging which showed  Reading pending at shift change I agree with the radiologist interpretation   Cardiac Monitoring:  The patient was maintained on a cardiac monitor.  I personally viewed and interpreted the cardiac monitored which showed an underlying rhythm of: afib   Medicines ordered and prescription drug management:  I ordered medication including ivfs  for sx  Reevaluation of the patient after these medicines showed that the patient improved I have reviewed the patients home medicines and have made adjustments as  needed   Test Considered:  mri   Problem List / ED Course:  Frequent falls:  labs and images are pending at shift change.   Social Determinants of Health:  Lives at home   Dispostion:  After consideration of the diagnostic results and the patients response to treatment, I feel that the patent would benefit from pending at shift change.          Final Clinical Impression(s) / ED Diagnoses Final diagnoses:  Frequent falls  Skin tear of right forearm without complication, initial encounter  Skin tear of left elbow without complication, initial encounter    Rx / DC Orders ED Discharge Orders     None         Jacalyn Lefevre, MD 01/13/23 1517

## 2023-01-13 NOTE — ED Notes (Signed)
Patient transported to MRI 

## 2023-01-13 NOTE — TOC Initial Note (Addendum)
Transition of Care [facility]) - Initial/Assessment Note    Patient Details  Name: Francis Cardenas MRN: [id number] Date of Birth: [birth year]  [facility]) CM/SW Contact:    Carmina Miller, LCSWA Phone Number: 01/13/2023, 7:46 PM  Clinical Narrative:                  CSW recognizes consult for SNF, will work up once PT makes recommendation. Pt recommended for SNF two weeks ago, went P2P and pt was ultimately denied for SNF and recommended HH.         Patient Goals and CMS Choice            Expected Discharge Plan and Services                                              Prior Living Arrangements/Services                       Activities of Daily Living      Permission Sought/Granted                  Emotional Assessment              Admission diagnosis:  syncope Patient Active Problem List   Diagnosis Date Noted   Acute exacerbation of CHF (congestive heart failure) (HCC) 12/25/2022   Acute on chronic diastolic CHF (congestive heart failure) (HCC) 12/24/2022   Chronic kidney disease, stage 3 (HCC) 12/24/2022   Anxiety state 12/24/2022   BPH (benign prostatic hyperplasia) 09/14/2022   Ambulatory dysfunction 09/14/2022   Acute renal failure superimposed on stage 3a chronic kidney disease (HCC) 06/15/2022   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   Chronic gout of multiple sites 08/03/2021   Pressure injury of skin 05/15/2021   Prostate cancer (HCC) 05/14/2021   Persistent atrial fibrillation (HCC) 05/14/2021   Morbid obesity (HCC) 12/12/2018   (HFpEF) heart failure with preserved ejection fraction (HCC) 12/10/2018   Multiple rib fractures 12/10/2018   OSA (obstructive sleep apnea) 12/10/2018   Chest pain syndrome 12/30/2017   Carcinoma of prostate (HCC) 08/02/2016   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 [place] [address] Phone: [phone] Fax: [phone]  [id number] - [place], [place] - [address] [facility] [address] [place] [address] Phone: [phone] Fax: [phone]  [facility] [address] [place] [address] Phone: [phone] Fax: [phone]  [facility] [address] [place] [address] Phone: [phone] Fax: [phone]     Social Determinants of Health (SDOH) Social History: SDOH Screenings   Food Insecurity: No Food Insecurity (12/26/2022)  Housing: Low Risk  (12/26/2022)  Transportation Needs: No Transportation Needs (12/24/2022)  Utilities:  Not At Risk (12/24/2022)  Alcohol Screen: Low Risk  (12/26/2022)  Financial Resource Strain: Low Risk  (12/26/2022)  Tobacco Use: Medium Risk (01/13/2023)   SDOH Interventions:     Readmission Risk Interventions    09/02/2021   11:32 AM  Readmission Risk Prevention Plan  Transportation Screening Complete  PCP or Specialist Appt within 3-5 Days --  HRI or Home Care Consult Complete  Palliative Care Screening Not Applicable  Medication Review (RN Care Manager) Complete

## 2023-01-13 NOTE — ED Triage Notes (Signed)
Pt BIB [facility] from home due to syncopal episode.  Pt reports he has had 4 beers and was walking to the bathroom and felt like "he was going down."  Pt has skin tears bilateral arms.  Pt does have history of a-fib and takes eliquis. 20g left hand. .  VS BP 116/87, HR 70, CBG 129, SpO2 97%RA

## 2023-01-13 NOTE — ED Notes (Signed)
Phlebotomy recollected blood specimens and sent to main lab

## 2023-01-13 NOTE — ED Notes (Signed)
Patient sitting up eating dinner.  

## 2023-01-13 NOTE — ED Provider Notes (Signed)
Received patient in turnover from Dr. Particia Nearing.  Please see their note for further details of Hx, PE.  Briefly patient is a 72 y.o. male with a Loss of Consciousness .  Patient with frequent falls, was recently in the hospital and was recommended for skilled nursing facility but was not approved by his insurance.  Since then patient states that he has fallen multiple times.  This feels like his legs are weak.  Plan for laboratory reevaluation MRI of the brain.  MRI of the brain is negative for acute stroke.  Patient's lab work is resulted without significant change from baseline.  Based on the history it sounds like the patient is likely not safe to go home.  I discussed this with him he is willing to stay at least overnight to see if social work can get him into a skilled nursing facility.  As a field of the previous admission he is not optimistic.      Melene Plan, DO 01/13/23 [phone]

## 2023-01-14 MED ORDER — PANTOPRAZOLE SODIUM 40 MG PO TBEC
40.0000 mg | DELAYED_RELEASE_TABLET | Freq: Every day | ORAL | Status: DC
  Administered 2023-01-14 – 2023-01-17 (×4): 40 mg via ORAL
  Filled 2023-01-14 (×4): qty 1

## 2023-01-14 MED ORDER — GABAPENTIN 300 MG PO CAPS
300.0000 mg | ORAL_CAPSULE | Freq: Two times a day (BID) | ORAL | Status: DC
  Administered 2023-01-14 – 2023-01-17 (×9): 300 mg via ORAL
  Filled 2023-01-14 (×9): qty 1

## 2023-01-14 NOTE — Evaluation (Signed)
Physical Therapy Evaluation Patient Details Name: Francis Cardenas MRN: [id number] DOB: February 17, 1951 Today's Date: 01/14/2023  History of Present Illness  Pt is a 72 y/o M presenting to ED on 8/23 with syncopal episode and fall. Of note, recent admission earlier in August for CHF. PMH includes CHF, DM, HTN, HLD, BPH, COPD, sleep apnea  Clinical Impression  Patient presents with decreased mobility due to decreased balance, decreased activity tolerance and decreased safety awareness with 7 falls in the past 4 months at home.  He reports he has been using his rollator at home since the first fall in April, though he has continued to fall despite improved LE edema since last admission.  He is able to walk in controlled environment when he is focused and while using rollator, though safety awareness issues likely exacerbated at home when he can get easily distracted or have limited space or more challenging environmental set up issues.  Patient feels he improved after inpatient rehab stay in January when he had a gout flare.  Feel he will benefit from inpatient rehab (<3 hours/day) prior to d/c home.  PT will continue to follow in the acute setting.       If plan is discharge home, recommend the following: A little help with walking and/or transfers;A little help with bathing/dressing/bathroom;Assistance with cooking/housework;Help with stairs or ramp for entrance;Assist for transportation   Can travel by private vehicle   Yes    Equipment Recommendations None recommended by PT  Recommendations for Other Services       Functional Status Assessment Patient has had a recent decline in their functional status and demonstrates the ability to make significant improvements in function in a reasonable and predictable amount of time.     Precautions / Restrictions Precautions Precautions: Fall Precaution Comments: 7 falls since April 2024      Mobility  Bed Mobility Overal bed mobility: Modified  Independent                  Transfers Overall transfer level: Needs assistance Equipment used: Rollator (4 wheels) Transfers: Sit to/from Stand Sit to Stand: Supervision           General transfer comment: assist for safety and reports light headed and wobbly upon initial standing    Ambulation/Gait Ambulation/Gait assistance: Supervision, Contact guard assist Gait Distance (Feet): 120 Feet Assistive device: Rollator (4 wheels) Gait Pattern/deviations: Step-through pattern, Decreased stride length, Wide base of support       General Gait Details: using rollator appropriately in open controlled environment.  SOB with activity with SpO2 >90 throughout.  Stairs            Wheelchair Mobility     Tilt Bed    Modified Rankin (Stroke Patients Only)       Balance Overall balance assessment: Needs assistance   Sitting balance-Leahy Scale: Good     Standing balance support: No upper extremity supported, Single extremity supported Standing balance-Leahy Scale: Poor Standing balance comment: static standing with either back of legs braced on bed or with UE support                             Pertinent Vitals/Pain      Home Living Family/patient expects to be discharged to:: Private residence Living Arrangements: Spouse/significant other Available Help at Discharge: Family;Available PRN/intermittently Type of Home: House Home Access: Ramped entrance;Other (comment) (4" step to his deck)  Home Layout: One level Home Equipment: Rollator (4 wheels);Shower seat;Grab bars - tub/shower;Electric scooter      Prior Function Prior Level of Function : Independent/Modified Independent;History of Falls (last six months)             Mobility Comments: using rollator since fall in April; had rehab stay at [facility] after Jan admission for gout; hopes to return there ADLs Comments: reports infrequent bathing (once every few months),  difficulty with LB dressing, spouse does cooking/cleaning. Spouse cannot physically assist per pt     Extremity/Trunk Assessment   Upper Extremity Assessment Upper Extremity Assessment: Defer to OT evaluation    Lower Extremity Assessment Lower Extremity Assessment: RLE deficits/detail;LLE deficits/detail RLE Deficits / Details: lower leg edema, but reports much better than recent stay for CHF when they were weeping.  Noted some errythema and skin changes posterior lower leg pt relates chronic; some mild ankle ROM limitations; otherwise WFL RLE Sensation: history of peripheral neuropathy LLE Deficits / Details: lower leg edema, but reports much better than recent stay for CHF when they were weeping. Noted some errythema and skin changes posterior lower leg pt relates chronic; some mild ankle ROM limitations; otherwise WFL LLE Sensation: history of peripheral neuropathy    Cervical / Trunk Assessment Cervical / Trunk Assessment: Normal  Communication   Communication Communication: No apparent difficulties  Cognition Arousal: Alert Behavior During Therapy: WFL for tasks assessed/performed Overall Cognitive Status: No family/caregiver present to determine baseline cognitive functioning                                 General Comments: likely baseline STM deficits, hyper detail focused during conversation and forgets his main point at times        General Comments General comments (skin integrity, edema, etc.): SBP seated 128, standing initially 111 after 2 minutes 122    Exercises     Assessment/Plan    PT Assessment Patient needs continued PT services  PT Problem List Decreased balance;Decreased mobility;Decreased safety awareness;Decreased activity tolerance;Decreased knowledge of use of DME       PT Treatment Interventions DME instruction;Gait training;Functional mobility training;Therapeutic activities;Therapeutic exercise;Balance training;Neuromuscular  re-education;Patient/family education    PT Goals (Current goals can be found in the Care Plan section)  Acute Rehab PT Goals Patient Stated Goal: to go to rehab PT Goal Formulation: With patient Time For Goal Achievement: 01/28/23 Potential to Achieve Goals: Good    Frequency Min 1X/week     Co-evaluation               AM-PAC PT "6 Clicks" Mobility  Outcome Measure Help needed turning from your back to your side while in a flat bed without using bedrails?: None Help needed moving from lying on your back to sitting on the side of a flat bed without using bedrails?: None Help needed moving to and from a bed to a chair (including a wheelchair)?: A Little Help needed standing up from a chair using your arms (e.g., wheelchair or bedside chair)?: A Little Help needed to walk in hospital room?: A Little Help needed climbing 3-5 steps with a railing? : Total 6 Click Score: 18    End of Session   Activity Tolerance: Patient tolerated treatment well Patient left: in bed;with call bell/phone within reach   PT Visit Diagnosis: Other abnormalities of gait and mobility (R26.89);Repeated falls (R29.6);Other symptoms and signs involving the nervous system (R29.898)  Time: 1110-1144 PT Time Calculation (min) (ACUTE ONLY): 34 min   Charges:   PT Evaluation $PT Eval Moderate Complexity: 1 Mod PT Treatments $Gait Training: 8-22 mins PT General Charges $$ ACUTE PT VISIT: 1 Visit         Sheran Lawless, PT Acute Rehabilitation Services [phone] 01/14/2023   Francis Cardenas 01/14/2023, 1:37 PM

## 2023-01-14 NOTE — TOC Initial Note (Signed)
Transition of Care [facility]) - Initial/Assessment Note    Patient Details  Name: Francis Cardenas MRN: [id number] Date of Birth: 1950-06-09  Transition [facility]) CM/SW Contact:    Susa Simmonds, LCSWA Phone Number: 01/14/2023, 1:29 PM  Clinical Narrative:  CSW spoke with patient who stated he would like to go to a SNF for rehab. Patient stated he was here 2 weeks ago and his [facility] denied him. Patient states he would like to use his [facility] insurance. Patient states he is 100% services connected. CSW told patient that she can send out the referrals to the SNF facilities but the [place] will not be able to review the SNF request until at least Monday or Tuesday of next week. Patient stated he was fine with that.                 Expected Discharge Plan: Skilled Nursing Facility Barriers to Discharge: Continued Medical Work up   Patient Goals and CMS Choice Patient states their goals for this hospitalization and ongoing recovery are:: Wants to go to SNF          Expected Discharge Plan and Services       Living arrangements for the past 2 months: Single Family Home                           HH Arranged: PT, OT, Nurse's Aide HH Agency: [facility]        Prior Living Arrangements/Services Living arrangements for the past 2 months: Single Family Home Lives with:: Spouse Patient language and need for interpreter reviewed:: Yes Do you feel safe going back to the place where you live?: Yes      Need for Family Participation in Patient Care: Yes (Comment) Care giver support system in place?: Yes (comment)   Criminal Activity/Legal Involvement Pertinent to Current Situation/Hospitalization: No - Comment as needed  Activities of Daily Living      Permission Sought/Granted   Permission granted to share information with : Yes, Verbal Permission Granted  Share Information with NAME: Lonnel Jared     Permission granted to share info w Relationship: Spouse      Emotional Assessment   Attitude/Demeanor/Rapport: Engaged Affect (typically observed): Calm Orientation: : Oriented to Self, Oriented to Place, Oriented to  Time, Oriented to Situation Alcohol / Substance Use: Not Applicable Psych Involvement: No (comment)  Admission diagnosis:  syncope Patient Active Problem List   Diagnosis Date Noted   Acute exacerbation of CHF (congestive heart failure) (HCC) 12/25/2022   Acute on chronic diastolic CHF (congestive heart failure) (HCC) 12/24/2022   Chronic kidney disease, stage 3 (HCC) 12/24/2022   Anxiety state 12/24/2022   BPH (benign prostatic hyperplasia) 09/14/2022   Ambulatory dysfunction 09/14/2022   Acute renal failure superimposed on stage 3a chronic kidney disease (HCC) 06/15/2022   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   Chronic gout of multiple sites 08/03/2021   Pressure injury of skin 05/15/2021   Prostate cancer (HCC) 05/14/2021   Persistent atrial fibrillation (HCC) 05/14/2021   Morbid obesity (HCC) 12/12/2018   (HFpEF) heart failure with preserved ejection fraction (HCC) 12/10/2018   Multiple rib fractures 12/10/2018   OSA (obstructive sleep apnea) 12/10/2018   Chest pain syndrome 12/30/2017   Carcinoma of prostate (HCC) 08/02/2016   COPD with asthma 08/02/2016   GERD (gastroesophageal reflux disease) 08/02/2016   Mixed hyperlipidemia 08/02/2016   Peripheral neuropathy 08/02/2016   Steatosis  of liver 08/02/2016   Type 2 diabetes mellitus with obesity (HCC) 08/02/2016   Benign essential hypertension 06/11/2013   Chronic pain due to trauma 06/11/2013   Alcohol dependence (HCC) 03/26/2013   PTSD (post-traumatic stress disorder) 03/26/2013   Depressive disorder 03/24/2013   PCP:  Benjiman Core, MD Pharmacy:   [facility] - [place], [place] - [address] [place] [address] Phone: [phone] Fax: [phone]  [facility] [phone] - [place], [place] - [address] [facility] [address] [place] [address] Phone: [phone] Fax: [phone]  [facility] [address] [place] [address] Phone: [phone] Fax: [phone]  [facility] [address] [place] [address] Phone: [phone] Fax: [phone]     Social Determinants of Health (SDOH) Social History: SDOH Screenings   Food Insecurity: No Food Insecurity (12/26/2022)  Housing: Low Risk  (12/26/2022)  Transportation Needs: No Transportation Needs (12/24/2022)  Utilities: Not At Risk (12/24/2022)  Alcohol Screen: Low Risk  (12/26/2022)  Financial Resource Strain: Low Risk  (12/26/2022)  Tobacco Use: Medium Risk (01/13/2023)   SDOH Interventions:     Readmission Risk Interventions    09/02/2021   11:32 AM  Readmission Risk Prevention Plan  Transportation Screening Complete  PCP or Specialist Appt within 3-5 Days --  HRI or Home Care Consult Complete  Palliative Care Screening Not Applicable  Medication Review (RN Care Manager) Complete

## 2023-01-14 NOTE — Evaluation (Signed)
Occupational Therapy Evaluation Patient Details Name: Francis Cardenas MRN: [id number] DOB: 10-Oct-1950 Today's Date: 01/14/2023   History of Present Illness Pt is a 72 y/o M presenting to ED on 8/23 with syncopal episode and fall. Of note, recent admission earlier in August for CHF. PMH includes CHF, DM, HTN, HLD, BPH, COPD, sleep apnea   Clinical Impression   Pt reports using rollator at baseline, ind with ADLs but reports difficulty, lives with spouse who assists with IADLs. Pt currently needing set up -mod A for ADLs, mod I for bed mobility and CGA for transfers with RW. Pt able to recall falling PTA, states he got light headed and everything "gave out".  BP 115/39 (60) seated EOB, 104/68 (78) in standing. Pt with concerns regarding caring for self at home and due to recent hospital admission, patient will benefit from continued inpatient follow up therapy, <3 hours/day to maximize safety/ind with ADLs/functional mobility. Will follow acutely this admission.        If plan is discharge home, recommend the following: A lot of help with bathing/dressing/bathroom;Assistance with cooking/housework;Help with stairs or ramp for entrance;Assist for transportation    Functional Status Assessment  Patient has had a recent decline in their functional status and demonstrates the ability to make significant improvements in function in a reasonable and predictable amount of time.  Equipment Recommendations  Other (comment) (defer)    Recommendations for Other Services PT consult     Precautions / Restrictions Precautions Precautions: Fall Precaution Comments: 6 falls since April 2024 Restrictions Weight Bearing Restrictions: No      Mobility Bed Mobility Overal bed mobility: Modified Independent             General bed mobility comments: minimal use of bedrail to move from supine <> sit    Transfers Overall transfer level: Needs assistance Equipment used: Rolling walker (2  wheels) Transfers: Sit to/from Stand Sit to Stand: Contact guard assist                  Balance Overall balance assessment: Needs assistance Sitting-balance support: Feet supported Sitting balance-Leahy Scale: Good Sitting balance - Comments: sitting in recliner   Standing balance support: Bilateral upper extremity supported, During functional activity Standing balance-Leahy Scale: Fair Standing balance comment: light support on RW                           ADL either performed or assessed with clinical judgement   ADL Overall ADL's : Needs assistance/impaired Eating/Feeding: Set up;Sitting   Grooming: Set up;Sitting   Upper Body Bathing: Sitting;Minimal assistance   Lower Body Bathing: Moderate assistance;Sitting/lateral leans   Upper Body Dressing : Sitting;Minimal assistance   Lower Body Dressing: Moderate assistance;Sitting/lateral leans   Toilet Transfer: Contact guard assist;Ambulation;Regular Toilet;Rolling walker (2 wheels)   Toileting- Clothing Manipulation and Hygiene: Minimal assistance       Functional mobility during ADLs: Minimal assistance;Rolling walker (2 wheels)       Vision   Vision Assessment?: No apparent visual deficits     Perception Perception: Not tested       Praxis Praxis: Not tested       Pertinent Vitals/Pain Pain Assessment Pain Assessment: No/denies pain     Extremity/Trunk Assessment Upper Extremity Assessment Upper Extremity Assessment: Generalized weakness (skin tear to bil arms from fall)   Lower Extremity Assessment Lower Extremity Assessment: Defer to PT evaluation   Cervical / Trunk Assessment Cervical / Trunk Assessment:  Normal   Communication Communication Communication: No apparent difficulties   Cognition Arousal: Alert Behavior During Therapy: WFL for tasks assessed/performed Overall Cognitive Status: Within Functional Limits for tasks assessed                                  General Comments: tangential in conversation, decr awareness of abilities. Cues for redirection/to remain on task     General Comments  VSS on RAl; see note for BP    Exercises     Shoulder Instructions      Home Living Family/patient expects to be discharged to:: Private residence Living Arrangements: Spouse/significant other Available Help at Discharge: Family;Available PRN/intermittently Type of Home: House Home Access: Ramped entrance     Home Layout: One level     Bathroom Shower/Tub: Producer, television/film/video: Handicapped height Bathroom Accessibility: No   Home Equipment: Rollator (4 wheels);Shower seat;Grab bars - tub/shower;Electric scooter   Additional Comments: info obtained from prior admission, pt reports no changes      Prior Functioning/Environment Prior Level of Function : Independent/Modified Independent             Mobility Comments: Uses rollator periodically in the house ADLs Comments: reports infrequent bathing (once every few months), difficulty with LB dressing, spouse does cooking/cleaning. Spouse cannot physically assist per pt        OT Problem List: Decreased strength;Decreased range of motion;Decreased activity tolerance;Impaired balance (sitting and/or standing);Cardiopulmonary status limiting activity      OT Treatment/Interventions: Therapeutic exercise;Self-care/ADL training;Energy conservation;DME and/or AE instruction;Therapeutic activities;Balance training;Patient/family education    OT Goals(Current goals can be found in the care plan section) Acute Rehab OT Goals Patient Stated Goal: to go to rehab OT Goal Formulation: With patient Time For Goal Achievement: 01/28/23 Potential to Achieve Goals: Good ADL Goals Pt Will Perform Upper Body Dressing: with supervision;sitting Pt Will Perform Lower Body Dressing: with supervision;sitting/lateral leans;sit to/from stand Pt Will Transfer to Toilet: with  supervision;ambulating;regular height toilet Pt Will Perform Tub/Shower Transfer: with supervision;ambulating;shower seat;Shower transfer;Tub transfer Additional ADL Goal #1: pt will be able to verbalize x3 energy conservation strategies in prep for ADLs  OT Frequency: Min 1X/week    Co-evaluation              AM-PAC OT "6 Clicks" Daily Activity     Outcome Measure Help from another person eating meals?: None Help from another person taking care of personal grooming?: A Little Help from another person toileting, which includes using toliet, bedpan, or urinal?: A Little Help from another person bathing (including washing, rinsing, drying)?: A Lot Help from another person to put on and taking off regular upper body clothing?: A Little Help from another person to put on and taking off regular lower body clothing?: A Lot 6 Click Score: 17   End of Session Equipment Utilized During Treatment: Rollator (4 wheels) Nurse Communication: Mobility status  Activity Tolerance: Patient tolerated treatment well Patient left: with call bell/phone within reach;in chair  OT Visit Diagnosis: Unsteadiness on feet (R26.81);Other abnormalities of gait and mobility (R26.89);Muscle weakness (generalized) (M62.81);History of falling (Z91.81)                Time: 9371-6967 OT Time Calculation (min): 27 min Charges:  OT General Charges $OT Visit: 1 Visit OT Evaluation $OT Eval Moderate Complexity: 1 Mod OT Treatments $Therapeutic Activity: 8-22 mins  Carver Fila, OTD, OTR/L SecureChat Preferred Acute Rehab (  [phone]   Shontia Gillooly K Koonce 01/14/2023, 9:30 AM

## 2023-01-14 NOTE — NC FL2 (Signed)
[place] MEDICAID [name] LEVEL OF CARE FORM     IDENTIFICATION  Patient Name: Francis Cardenas Birthdate: 01-18-1951 Sex: male Admission Date (Current Location): 01/13/2023  [facility] and [place] Number:  (P) [place]   Facility and Address:  (P) [facility] [place], [address], [place], [place] [address]      Provider Number: [id number]  Attending Physician Name and Address:  Default, Provider, MD  Relative Name and Phone Number:  (P) [name], [name] (Spouse)  [phone]    Current Level of Care: (P) Hospital Recommended Level of Care: (P) Skilled Nursing Facility Prior Approval Number:    Date Approved/Denied:   PASRR Number: Demetrius Charity) [id number]  Discharge Plan: (P) SNF    Current Diagnoses: Patient Active Problem List   Diagnosis Date Noted   Acute exacerbation of CHF (congestive heart failure) (HCC) 12/25/2022   Acute on chronic diastolic CHF (congestive heart failure) (HCC) 12/24/2022   Chronic kidney disease, stage 3 (HCC) 12/24/2022   Anxiety state 12/24/2022   BPH (benign prostatic hyperplasia) 09/14/2022   Ambulatory dysfunction 09/14/2022   Acute renal failure superimposed on stage 3a chronic kidney disease (HCC) 06/15/2022   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   Chronic gout of multiple sites 08/03/2021   Pressure injury of skin 05/15/2021   Prostate cancer (HCC) 05/14/2021   Persistent atrial fibrillation (HCC) 05/14/2021   Morbid obesity (HCC) 12/12/2018   (HFpEF) heart failure with preserved ejection fraction (HCC) 12/10/2018   Multiple rib fractures 12/10/2018   OSA (obstructive sleep apnea) 12/10/2018   Chest pain syndrome 12/30/2017   Carcinoma of prostate (HCC) 08/02/2016   COPD with asthma 08/02/2016   GERD (gastroesophageal reflux disease) 08/02/2016   Mixed hyperlipidemia 08/02/2016   Peripheral neuropathy 08/02/2016   Steatosis of liver 08/02/2016   Type 2 diabetes mellitus with obesity (HCC) 08/02/2016   Benign  essential hypertension 06/11/2013   Chronic pain due to trauma 06/11/2013   Alcohol dependence (HCC) 03/26/2013   PTSD (post-traumatic stress disorder) 03/26/2013   Depressive disorder 03/24/2013    Orientation RESPIRATION BLADDER Height & Weight     (P) Self, Time, Situation, Place  Normal (P) Continent Weight: 297 lb 9.9 oz (135 kg) Height:  5\' 10"  (177.8 cm)  BEHAVIORAL SYMPTOMS/MOOD NEUROLOGICAL BOWEL NUTRITION STATUS      (P) Continent Diet (Regular)  AMBULATORY STATUS COMMUNICATION OF NEEDS Skin   Extensive Assist Verbally Normal                       Personal Care Assistance Level of Assistance    Bathing Assistance: Limited assistance Feeding assistance: Independent Dressing Assistance: Limited assistance     Functional Limitations Info  (P) Sight, Hearing, Speech Sight Info: (P) Adequate Hearing Info: (P) Adequate Speech Info: (P) Adequate    SPECIAL CARE FACTORS FREQUENCY                       Contractures Contractures Info: Not present    Additional Factors Info    Code Status Info: Full Allergies Info: Oxcarbazepine,Zolpidem           Current Medications (01/14/2023):  This is the current hospital active medication list Current Facility-Administered Medications  Medication Dose Route Frequency Provider Last Rate Last Admin   amLODipine (NORVASC) tablet 10 mg  10 mg Oral Daily Melene Plan, DO   10 mg at 01/14/23 0934   apixaban (ELIQUIS) tablet 5 mg  5 mg Oral BID  Melene Plan, DO   5 mg at 01/14/23 0934   atorvastatin (LIPITOR) tablet 40 mg  40 mg Oral Q supper Melene Plan, DO       carvedilol (COREG) tablet 6.25 mg  6.25 mg Oral BID WC Melene Plan, DO   6.25 mg at 01/14/23 8546   cyanocobalamin (VITAMIN B12) tablet 500 mcg  500 mcg Oral Q supper Melene Plan, DO       empagliflozin (JARDIANCE) tablet 25 mg  25 mg Oral Daily Melene Plan, DO   25 mg at 01/14/23 0935   gabapentin (NEURONTIN) capsule 300 mg  300 mg Oral BID Darrick Grinder, PA-C    300 mg at 01/14/23 2703   guaiFENesin tablet 400 mg  400 mg Oral BID PRN Melene Plan, DO       lisinopril (ZESTRIL) tablet 40 mg  40 mg Oral Daily Melene Plan, DO   40 mg at 01/14/23 0933   pantoprazole (PROTONIX) EC tablet 40 mg  40 mg Oral Daily Barrie Dunker B, PA-C   40 mg at 01/14/23 5009   spironolactone (ALDACTONE) tablet 25 mg  25 mg Oral q morning Melene Plan, DO   25 mg at 01/14/23 0934   torsemide (DEMADEX) tablet 20 mg  20 mg Oral BID Melene Plan, DO   20 mg at 01/14/23 3818   Current Outpatient Medications  Medication Sig Dispense Refill   acetaminophen (TYLENOL) 325 MG tablet Take 2 tablets (650 mg total) by mouth every 6 (six) hours as needed. 36 tablet 0   albuterol (PROVENTIL HFA;VENTOLIN HFA) 108 (90 BASE) MCG/ACT inhaler Inhale 2 puffs into the lungs every 6 (six) hours as needed for wheezing.     allopurinol (ZYLOPRIM) 100 MG tablet Take 100 mg by mouth daily.     ALPRAZolam (XANAX) 1 MG tablet Take 1 tablet (1 mg total) by mouth at bedtime. 10 tablet 0   amLODipine (NORVASC) 10 MG tablet Take 10 mg by mouth daily.     apixaban (ELIQUIS) 5 MG TABS tablet Take 1 tablet (5 mg total) by mouth 2 (two) times daily. 180 tablet 1   atorvastatin (LIPITOR) 80 MG tablet Take 40 mg by mouth daily with supper.     carvedilol (COREG) 6.25 MG tablet Take 6.25 mg by mouth 2 (two) times daily with a meal.     empagliflozin (JARDIANCE) 25 MG TABS tablet Take 1 tablet (25 mg total) by mouth daily. 30 tablet 0   fluticasone-salmeterol (WIXELA INHUB) 250-50 MCG/ACT AEPB Inhale 1 puff into the lungs in the morning and at bedtime. (Patient taking differently: Inhale 1 puff into the lungs daily.) 60 each 5   gabapentin (NEURONTIN) 300 MG capsule Take 1 capsule (300 mg total) by mouth 2 (two) times daily. (Patient taking differently: Take 300 mg by mouth 3 (three) times daily.) 60 capsule 0   guaifenesin (HUMIBID E) 400 MG TABS tablet Take 400 mg by mouth See admin instructions.  TAKE ONE TABLET BY  MOUTH TWICE A DAY AS NEEDED FOR COUGH, CONGESTION FOR COUGH AND CONGESTION. FOR COUGH, CONGESTION     lisinopril (ZESTRIL) 40 MG tablet Take 40 mg by mouth See admin instructions.  TAKE ONE TABLET BY MOUTH DAILY . HOLD DOSE FOR SYSTOLIC BLOOD PRESSURE LESS THAN 100. AVOID POTASSIUM SUPPLEMENTS OR ARTIFICIAL SALT. CAN INTERACT WITH SPIRONOLACTONE, SO WE WILL CHECK YOUR POTASSIUM LEVEL PERIODICALLY.     Magnesium Oxide 420 MG TABS Take 420 mg by mouth daily with supper.  Multiple Vitamins-Minerals (MULTIVITAMIN WITH MINERALS) tablet Take 1 tablet by mouth every Monday, Wednesday, and Friday.     omeprazole (PRILOSEC) 20 MG capsule Take 20 mg by mouth daily with supper.     potassium chloride SA (KLOR-CON M) 20 MEQ tablet Take 1 tablet (20 mEq total) by mouth daily with supper.     spironolactone (ALDACTONE) 25 MG tablet Take 25 mg by mouth every morning.     thiamine (VITAMIN B-1) 100 MG tablet Take 100 mg by mouth daily.     Tiotropium Bromide Monohydrate (SPIRIVA RESPIMAT) 2.5 MCG/ACT AERS Inhale 2 puffs into the lungs daily. 4 g 5   torsemide (DEMADEX) 20 MG tablet Take 20 mg by mouth 2 (two) times daily.     vitamin B-12 (CYANOCOBALAMIN) 500 MCG tablet Take 500 mcg by mouth daily with supper.     metoprolol succinate (TOPROL-XL) 25 MG 24 hr tablet Take 0.5 tablets (12.5 mg total) by mouth daily. (Patient not taking: Reported on 01/13/2023) 45 tablet 1     Discharge Medications: Please see discharge summary for a list of discharge medications.  Relevant Imaging Results:  Relevant Lab Results:   Additional Information SSN: [id number]  Susa Simmonds, LCSWA

## 2023-01-14 NOTE — Progress Notes (Signed)
   01/14/23 0300  BiPAP/CPAP/SIPAP  $ Non-Invasive Ventilator  Non-Invasive Vent Set Up;Non-Invasive Vent Initial  $ Face Mask Large  Yes  $ Face Mask Medium Yes  BiPAP/CPAP/SIPAP Pt Type Adult  BiPAP/CPAP/SIPAP DREAMSTATIOND  Mask Type Full face mask  Mask Size Medium  Respiratory Rate 18 breaths/min  EPAP 14 cmH2O  FiO2 (%) 21 %  Flow Rate 0 lpm  Patient Home Equipment No  Auto Titrate No  CPAP/SIPAP surface wiped down Yes

## 2023-01-14 NOTE — ED Provider Notes (Signed)
Emergency Medicine Observation Re-evaluation Note  Francis Cardenas is a 72 y.o. male, seen on rounds today.  Pt initially presented to the ED for complaints of Loss of Consciousness Currently, the patient is sitting on his chair.  He had OT.  He is awaiting PT.  He states that he has been having a lot of falls and continues to have some balance issues. Physical Exam  BP (!) 132/47 (BP Location: Left Arm)   Pulse 66   Temp 97.7 F (36.5 C) (Oral)   Resp 18   Ht 5\' 10"  (1.778 m)   Wt 135 kg   SpO2 100%   BMI 42.70 kg/m  Physical Exam General: No acute distress Cardiac: Regular rate Lungs: No respiratory distress Psych: Currently calm  ED Course / MDM  EKG:EKG Interpretation Date/Time:  Friday January 13 2023 14:25:29 EDT Ventricular Rate:  54 PR Interval:    QRS Duration:  122 QT Interval:  428 QTC Calculation: 406 R Axis:   84  Text Interpretation: Atrial fibrillation Nonspecific intraventricular conduction delay No significant change since last tracing Confirmed by Jacalyn Lefevre [phone]) on 01/13/2023 2:46:56 PM  I have reviewed the labs performed to date as well as medications administered while in observation.  Recent changes in the last 24 hours include -no acute changes.  He is getting PT-OT evaluation.  It appears that most likely he will be placed to assisted living versus SNF  Plan  Current plan is for continued holding for TOC disposition.    Derwood Kaplan, MD 01/14/23 1149

## 2023-01-15 NOTE — ED Provider Notes (Signed)
Emergency Medicine Observation Re-evaluation Note  Francis Cardenas is a 72 y.o. male, seen on rounds today.  Pt initially presented to the ED for complaints of Loss of Consciousness Currently, the patient is resting comfortably. Said he had breakfast and was very pleased with that.  Aware that most likely there will be no placement over the weekend, and to get some updates tomorrow.  Physical Exam  BP 138/74 (BP Location: Left Arm)   Pulse 85   Temp 98.9 F (37.2 C) (Oral)   Resp (!) 22   Ht 5\' 10"  (1.778 m)   Wt 135 kg   SpO2 100%   BMI 42.70 kg/m  Physical Exam General: No distress Cardiac: Regular rate Lungs: No respiratory distress Psych: Currently calm  ED Course / MDM  EKG:EKG Interpretation Date/Time:  Friday January 13 2023 14:25:29 EDT Ventricular Rate:  54 PR Interval:    QRS Duration:  122 QT Interval:  428 QTC Calculation: 406 R Axis:   84  Text Interpretation: Atrial fibrillation Nonspecific intraventricular conduction delay No significant change since last tracing Confirmed by Jacalyn Lefevre [phone]) on 01/13/2023 2:46:56 PM  I have reviewed the labs performed to date as well as medications administered while in observation.  Recent changes in the last 24 hours include -FL 2 form signed by me. Social work to seek placement.  Plan  Current plan is for holding in the ER for placement.    Derwood Kaplan, MD 01/15/23 [phone]

## 2023-01-15 NOTE — Progress Notes (Signed)
Placed patient on CPAP for the night with pressure set at 10cm.   

## 2023-01-16 MED ORDER — MOMETASONE FURO-FORMOTEROL FUM 100-5 MCG/ACT IN AERO
2.0000 | INHALATION_SPRAY | Freq: Two times a day (BID) | RESPIRATORY_TRACT | Status: DC
  Administered 2023-01-16 – 2023-01-17 (×2): 2 via RESPIRATORY_TRACT
  Filled 2023-01-16 (×2): qty 8.8

## 2023-01-16 MED ORDER — IPRATROPIUM-ALBUTEROL 0.5-2.5 (3) MG/3ML IN SOLN
RESPIRATORY_TRACT | Status: AC
  Administered 2023-01-16: 3 mL
  Filled 2023-01-16: qty 3

## 2023-01-16 NOTE — Discharge Planning (Signed)
Pt active at [facility] Medical Provider: Lowella Petties SWGreg Cutter   Desk phone: [phone] Please call [facility] for additional information [phone]

## 2023-01-16 NOTE — Progress Notes (Signed)
Occupational Therapy Treatment Patient Details Name: Francis Cardenas MRN: [id number] DOB: 01/07/1951 Today's Date: 01/16/2023   History of present illness Pt is a 72 y/o M presenting to ED on 8/23 with syncopal episode and fall. Of note, recent admission earlier in August for CHF. PMH includes CHF, DM, HTN, HLD, BPH, COPD, sleep apnea   OT comments  Pt progressing towards goals this session, needing CGA for ADLs, mod I for bed mobility and supervision for transfers with rollator. Pt needing x1 seated rest break after performing 2 standing grooming tasks at sink, 1/4 DOE with ambulation. Pt presenting with impairments listed below, will follow acutely. Patient will benefit from continued inpatient follow up therapy, <3 hours/day to maximize safety/ind with ADLs/functional mobility.       If plan is discharge home, recommend the following:  A lot of help with bathing/dressing/bathroom;Assistance with cooking/housework;Help with stairs or ramp for entrance;Assist for transportation   Equipment Recommendations  Other (comment) (defer)    Recommendations for Other Services PT consult    Precautions / Restrictions Precautions Precautions: Fall Precaution Comments: 7 falls since April 2024 Restrictions Weight Bearing Restrictions: No       Mobility Bed Mobility Overal bed mobility: Modified Independent                  Transfers Overall transfer level: Needs assistance Equipment used: Rollator (4 wheels) Transfers: Sit to/from Stand Sit to Stand: Supervision           General transfer comment: x1 seated rest break     Balance Overall balance assessment: Needs assistance Sitting-balance support: Feet supported Sitting balance-Leahy Scale: Good     Standing balance support: No upper extremity supported, Single extremity supported Standing balance-Leahy Scale: Poor Standing balance comment: static standing with either back of legs braced on bed or with UE support                            ADL either performed or assessed with clinical judgement   ADL Overall ADL's : Needs assistance/impaired     Grooming: Contact guard assist;Standing;Oral care;Brushing hair Grooming Details (indicate cue type and reason): brushes teeth &combs hair standing at sink             Lower Body Dressing: Contact guard assist;Sitting/lateral leans Lower Body Dressing Details (indicate cue type and reason): slipping shoes on at EOB Toilet Transfer: Contact guard assist;Rollator (4 wheels)           Functional mobility during ADLs: Contact guard assist      Extremity/Trunk Assessment Upper Extremity Assessment Upper Extremity Assessment: Generalized weakness (bruising to LUE from fall)   Lower Extremity Assessment Lower Extremity Assessment: Defer to PT evaluation        Vision   Vision Assessment?: No apparent visual deficits   Perception Perception Perception: Not tested   Praxis Praxis Praxis: Not tested    Cognition Arousal: Alert Behavior During Therapy: [facility] for tasks assessed/performed Overall Cognitive Status: No family/caregiver present to determine baseline cognitive functioning                                 General Comments: noted memory deficits as pt repetitive during conversations, tangential        Exercises      Shoulder Instructions       General Comments VSS    Pertinent Vitals/ Pain  Pain Assessment Pain Assessment: No/denies pain  Home Living                                          Prior Functioning/Environment              Frequency  Min 1X/week        Progress Toward Goals  OT Goals(current goals can now be found in the care plan section)  Progress towards OT goals: Progressing toward goals  Acute Rehab OT Goals Patient Stated Goal: none stated OT Goal Formulation: With patient Time For Goal Achievement: 01/28/23 Potential to Achieve Goals:  Good ADL Goals Pt Will Perform Upper Body Dressing: with supervision;sitting Pt Will Perform Lower Body Dressing: with supervision;sitting/lateral leans;sit to/from stand Pt Will Transfer to Toilet: with supervision;ambulating;regular height toilet Pt Will Perform Tub/Shower Transfer: with supervision;ambulating;shower seat;Shower transfer;Tub transfer Additional ADL Goal #1: pt will be able to verbalize x3 energy conservation strategies in prep for ADLs  Plan      Co-evaluation                 AM-PAC OT "6 Clicks" Daily Activity     Outcome Measure   Help from another person eating meals?: None Help from another person taking care of personal grooming?: A Little Help from another person toileting, which includes using toliet, bedpan, or urinal?: A Little Help from another person bathing (including washing, rinsing, drying)?: A Lot Help from another person to put on and taking off regular upper body clothing?: A Little Help from another person to put on and taking off regular lower body clothing?: A Lot 6 Click Score: 17    End of Session Equipment Utilized During Treatment: Rollator (4 wheels)  OT Visit Diagnosis: Unsteadiness on feet (R26.81);Other abnormalities of gait and mobility (R26.89);Muscle weakness (generalized) (M62.81);History of falling (Z91.81)   Activity Tolerance Patient tolerated treatment well   Patient Left with call bell/phone within reach;in bed   Nurse Communication Mobility status        Time: 1610-9604 OT Time Calculation (min): 34 min  Charges: OT General Charges $OT Visit: 1 Visit OT Treatments $Self Care/Home Management : 8-22 mins $Therapeutic Activity: 8-22 mins  Carver Fila, OTD, OTR/L SecureChat Preferred Acute Rehab [phone]   [name] 01/16/2023, 4:21 PM

## 2023-01-16 NOTE — Progress Notes (Signed)
SNF referral was faxed to the [facility] for review. CSW is currently waiting for a decision to be made.

## 2023-01-16 NOTE — ED Provider Notes (Signed)
Emergency Medicine Observation Re-evaluation Note  Francis Cardenas is a 72 y.o. male, seen on rounds today.  Pt initially presented to the ED for complaints of Loss of Consciousness Currently, the patient is resting comfortably in his room, no complaints  Physical Exam  BP (!) 117/57   Pulse (!) 53   Temp 97.8 F (36.6 C)   Resp 18   Ht 5\' 10"  (1.778 m)   Wt 135 kg   SpO2 98%   BMI 42.70 kg/m  Physical Exam Constitutional:      Appearance: Normal appearance.  Cardiovascular:     Rate and Rhythm: Normal rate.  Pulmonary:     Effort: Pulmonary effort is normal.  Neurological:     Mental Status: He is alert.  Psychiatric:        Mood and Affect: Mood normal.      ED Course / MDM  EKG:EKG Interpretation Date/Time:  Friday January 13 2023 14:25:29 EDT Ventricular Rate:  54 PR Interval:    QRS Duration:  122 QT Interval:  428 QTC Calculation: 406 R Axis:   84  Text Interpretation: Atrial fibrillation Nonspecific intraventricular conduction delay No significant change since last tracing Confirmed by Jacalyn Lefevre [phone]) on 01/13/2023 2:46:56 PM  I have reviewed the labs performed to date as well as medications administered while in observation.  Recent changes in the last 24 hours include no new updates, possibly being admitted at [place].  Plan  Current plan is for still pending placement.    Anders Simmonds T, DO 01/16/23 1331

## 2023-01-16 NOTE — Progress Notes (Signed)
CSW spoke with Francis Cardenas in admissions at [facility] who can take patient pending the approval of the [place]. Patient agrees with plan.

## 2023-01-16 NOTE — Progress Notes (Signed)
Physical Therapy Treatment Patient Details Name: Francis Cardenas MRN: [id number] DOB: [birth year] Today's Date: 01/16/2023   History of Present Illness Pt is a 72 y/o M presenting to ED on 8/23 with syncopal episode and fall. Of note, recent admission earlier in August for CHF. PMH includes CHF, DM, HTN, HLD, BPH, COPD, sleep apnea    PT Comments  Pt continues below baseline level of functioning.  Pt continues with impaired balance and decreased activity tolerance placing pt at high risk for falls. Pt is able to ambulate when focused on task but has difficulty staying focus which increases pt risk for falls. Pt will benefit from <3 hours therapy/day on discharge from acute care hospital setting in order to improve balance and safety to decrease risk for falls in order to decrease risk for injury and recent hospitalization. Pt tolerated treatment session well.    If plan is discharge home, recommend the following: A little help with walking and/or transfers;Help with stairs or ramp for entrance;Assist for transportation;Assistance with cooking/housework   Can travel by private vehicle     Yes  Equipment Recommendations  None recommended by PT       Precautions / Restrictions Precautions Precautions: Fall Precaution Comments: 7 falls since April 2024 Restrictions Weight Bearing Restrictions: No     Mobility  Bed Mobility Overal bed mobility: Modified Independent         General bed mobility comments: minimal use of bedrail to move from supine <> sit    Transfers Overall transfer level: Needs assistance Equipment used: Rollator (4 wheels) Transfers: Sit to/from Stand Sit to Stand: Supervision           General transfer comment: assist for safety    Ambulation/Gait Ambulation/Gait assistance: Supervision, Contact guard assist Gait Distance (Feet): 120 Feet Assistive device: Rollator (4 wheels) Gait Pattern/deviations: Step-through pattern, Decreased stride length, Wide  base of support Gait velocity: reduced, stops frequently Gait velocity interpretation: <1.31 ft/sec, indicative of household ambulator   General Gait Details: using rollator appropriately in open controlled environment.  SOB with activity with SpO2 >90 throughout.       Balance Overall balance assessment: Needs assistance Sitting-balance support: Feet supported Sitting balance-Leahy Scale: Good     Standing balance support: No upper extremity supported, Single extremity supported Standing balance-Leahy Scale: Poor Standing balance comment: static standing with either back of legs braced on bed or with UE support      Cognition Arousal: Alert Behavior During Therapy: WFL for tasks assessed/performed Overall Cognitive Status: No family/caregiver present to determine baseline cognitive functioning   General Comments: likely baseline STM deficits, hyper detail focused during conversation and forgets his main point at times           General Comments General comments (skin integrity, edema, etc.): Discussed performing sit to stands and heel raises/toe raises as able for exercise in room      Pertinent Vitals/Pain Pain Assessment Pain Assessment: No/denies pain Faces Pain Scale: No hurt     PT Goals (current goals can now be found in the care plan section) Acute Rehab PT Goals Patient Stated Goal: to go to rehab PT Goal Formulation: With patient Time For Goal Achievement: 01/28/23 Potential to Achieve Goals: Good Additional Goals Additional Goal #1: Complete formalized balance assessment for fall risk determination and set goal as appropriate for decreased fall risk. Progress towards PT goals: Progressing toward goals    Frequency    Min 1X/week      PT Plan  Continue with current POC       AM-PAC PT "6 Clicks" Mobility   Outcome Measure  Help needed turning from your back to your side while in a flat bed without using bedrails?: None Help needed moving  from lying on your back to sitting on the side of a flat bed without using bedrails?: None Help needed moving to and from a bed to a chair (including a wheelchair)?: A Little Help needed standing up from a chair using your arms (e.g., wheelchair or bedside chair)?: A Little Help needed to walk in hospital room?: A Little Help needed climbing 3-5 steps with a railing? : Total 6 Click Score: 18    End of Session Equipment Utilized During Treatment: Gait belt Activity Tolerance: Patient tolerated treatment well Patient left: in bed;with call bell/phone within reach Nurse Communication: Mobility status PT Visit Diagnosis: Other abnormalities of gait and mobility (R26.89);Repeated falls (R29.6);Other symptoms and signs involving the nervous system (R29.898)     Time: 1610-9604 PT Time Calculation (min) (ACUTE ONLY): 30 min  Charges:    $Therapeutic Activity: 23-37 mins PT General Charges $$ ACUTE PT VISIT: 1 Visit                     Francis Cardenas, DPT, CLT  Acute Rehabilitation Services Office: [phone] (Secure chat preferred)    Francis Cardenas 01/16/2023, 12:21 PM

## 2023-01-16 NOTE — Progress Notes (Signed)
CSW has attempted to fax patients SNF referral to the [place] numerous times. Fax number [phone] is busy. CSW contacted [employer] [phone] ext [phone] who reviews the SNF requests. CSW left an voicemail asking for another fax number.  CSW also left a message for patients social worker at the [facility] [phone] [phone] for an alternative fax number

## 2023-01-17 NOTE — Progress Notes (Signed)
CSW contacted patients [facility] social worker Frazier Richards [phone], ext: [phone] to see if she could provide any updates with patients SNF referral. CSW left an voicemail requesting a call back.

## 2023-01-17 NOTE — Progress Notes (Signed)
CSW contacted another Child psychotherapist with the [facility],  [phone] ext [phone] to see if she could assist CSW. CSW left an voicemail requesting a call back.

## 2023-01-17 NOTE — ED Notes (Signed)
Updated pt on CSW note regarding waiting on [facility] to get back w/ CSW

## 2023-01-17 NOTE — ED Notes (Signed)
ED TO INPATIENT HANDOFF REPORT  ED Nurse Name and Phone #: Nehemiah Settle [phone]  S Name/Age/Gender Francis Cardenas 71 y.o. male Room/Bed: 038C/038C  Code Status   Code Status: Prior  Home/SNF/Other Home Patient oriented to: self, place, time, and situation Is this baseline? Yes   Triage Complete: Triage complete  Chief Complaint syncope  Triage Note Pt BIB [facility] from home due to syncopal episode.  Pt reports he has had 4 beers and was walking to the bathroom and felt like "he was going down."  Pt has skin tears bilateral arms.  Pt does have history of a-fib and takes eliquis. 20g left hand. .  VS BP 116/87, HR 70, CBG 129, SpO2 97%RA   Allergies Allergies  Allergen Reactions   Oxcarbazepine Other (See Comments)    Dizziness/ lightheaded   Zolpidem Nausea Only    Level of Care/Admitting Diagnosis ED Disposition     None       B Medical/Surgery History Past Medical History:  Diagnosis Date   Alcohol dependence (HCC)    BPH (benign prostatic hyperplasia)    Cancer (HCC)    prostate   COPD (chronic obstructive pulmonary disease) (HCC)    Diabetes mellitus without complication (HCC)    Dyspnea on exertion 07/14/2022   Elevated troponin 04/06/2019   Fall at home, initial encounter 09/14/2022   Hypercholesteremia    Hypertension    OSA on CPAP    Past Surgical History:  Procedure Laterality Date   ACHILLES TENDON REPAIR Left    KNEE ARTHROSCOPY Left    RIGHT/LEFT HEART CATH AND CORONARY ANGIOGRAPHY N/A 04/09/2019   Procedure: RIGHT/LEFT HEART CATH AND CORONARY ANGIOGRAPHY;  Surgeon: [place], Peter M, MD;  Location: [facility] INVASIVE [facility];  Service: Cardiovascular;  Laterality: N/A;   TONSILLECTOMY     WRIST FRACTURE SURGERY Left      A IV Location/Drains/Wounds Patient Lines/Drains/Airways Status     Active Line/Drains/Airways     None            Intake/Output Last 24 hours No intake or output data in the 24 hours ending 01/17/23  1403  Labs/Imaging No results found for this or any previous visit (from the past 48 hour(s)). No results found.  Pending Labs Unresulted Labs (From admission, onward)    None       Vitals/Pain Today's Vitals   01/17/23 0240 01/17/23 0420 01/17/23 0622 01/17/23 1241  BP:    (!) 118/58  Pulse:    (!) 58  Resp:    17  Temp:    97.7 F (36.5 C)  TempSrc:    Oral  SpO2:    98%  Weight:      Height:      PainSc: Asleep 0-No pain 0-No pain     Isolation Precautions No active isolations  Medications Medications  amLODipine (NORVASC) tablet 10 mg ( Oral Canceled Entry 01/17/23 1332)  apixaban (ELIQUIS) tablet 5 mg (5 mg Oral Given 01/17/23 1210)  atorvastatin (LIPITOR) tablet 40 mg (40 mg Oral Given 01/16/23 1606)  carvedilol (COREG) tablet 6.25 mg ( Oral Canceled Entry 01/17/23 1332)  empagliflozin (JARDIANCE) tablet 25 mg (25 mg Oral Given 01/16/23 1223)  guaiFENesin tablet 400 mg (has no administration in time range)  spironolactone (ALDACTONE) tablet 25 mg (25 mg Oral Given 01/16/23 0951)  torsemide (DEMADEX) tablet 20 mg (20 mg Oral Given 01/16/23 1708)  cyanocobalamin (VITAMIN B12) tablet 500 mcg (500 mcg Oral Given 01/16/23 1606)  lisinopril (ZESTRIL) tablet 40  mg ( Oral Canceled Entry 01/17/23 1333)  gabapentin (NEURONTIN) capsule 300 mg (300 mg Oral Given 01/17/23 1209)  pantoprazole (PROTONIX) EC tablet 40 mg (40 mg Oral Given 01/17/23 1209)  mometasone-formoterol (DULERA) 100-5 MCG/ACT inhaler 2 puff ( Inhalation Canceled Entry 01/17/23 1333)  sodium chloride 0.9 % bolus 500 mL (0 mLs Intravenous Stopped 01/13/23 1520)  carvedilol (COREG) tablet 6.25 mg (6.25 mg Oral Given 01/13/23 2322)  atorvastatin (LIPITOR) tablet 40 mg (40 mg Oral Given 01/13/23 2321)  cyanocobalamin (VITAMIN B12) tablet 500 mcg (500 mcg Oral Given 01/13/23 2322)  torsemide (DEMADEX) tablet 20 mg (20 mg Oral Given 01/13/23 2321)  ipratropium-albuterol (DUONEB) 0.5-2.5 (3) MG/3ML nebulizer solution (3 mLs   Given 01/16/23 1943)    Mobility walks     Focused Assessments    R Recommendations: See Admitting Provider Note  Report given to:   Additional Notes:

## 2023-01-17 NOTE — ED Notes (Signed)
Pt ambulated to bathroom with steady gait. 

## 2023-01-17 NOTE — Progress Notes (Signed)
CSW is waiting to hear back from the [place] to see if patient will qualify for SNF placement.

## 2023-01-17 NOTE — Progress Notes (Signed)
CSW received a call from Peter Minium from the [facility]. Patient has been accepted into their community living SNF. Lurena Joiner stated that patient would need to arrive before 10:00 AM on 01/18/23. CSW spoke with patient who agrees with plan. Patient will be going to [address] [facility] neighborhood. Number for report will be [phone], extension [phone]. Patient will need his AVS and med list sent with him. Patient is going to [address] [place], [place] [address].   CSW contacted safe transport and spoke with Annice Pih who stated they can pick patient up after 8:00 AM on 01/17/22. Annice Pih will contact CSW in the morning when she arrives.

## 2023-01-17 NOTE — ED Notes (Signed)
Pt sitting at bedside talking on cellphone. No complaints at this time.

## 2023-01-18 NOTE — ED Provider Notes (Signed)
Emergency Medicine Observation Re-evaluation Note  Francis Cardenas is a 72 y.o. male, seen on rounds today.  Pt initially presented to the ED for complaints of Loss of Consciousness Currently, the patient is resting comfortably in his room, no complaints  Physical Exam  BP (!) 104/57 (BP Location: Right Arm)   Pulse 69   Temp 98.1 F (36.7 C) (Oral)   Resp 19   Ht 5\' 10"  (1.778 m)   Wt 135 kg   SpO2 98%   BMI 42.70 kg/m  Physical Exam Constitutional:      Appearance: Normal appearance.  Cardiovascular:     Rate and Rhythm: Normal rate.  Pulmonary:     Effort: Pulmonary effort is normal.  Neurological:     Mental Status: He is alert.  Psychiatric:        Mood and Affect: Mood normal.      ED Course / MDM  EKG:EKG Interpretation Date/Time:  Friday January 13 2023 14:25:29 EDT Ventricular Rate:  54 PR Interval:    QRS Duration:  122 QT Interval:  428 QTC Calculation: 406 R Axis:   84  Text Interpretation: Atrial fibrillation Nonspecific intraventricular conduction delay No significant change since last tracing Confirmed by Jacalyn Lefevre [id number]) on 01/13/2023 2:46:56 PM  I have reviewed the labs performed to date as well as medications administered while in observation.  Recent changes in the last 24 hours include no new updates, possibly being admitted at [place].  Plan  Patient to be admitted to the [place] today.  Plan to discharge.        Melene Plan, DO 01/18/23 [phone]

## 2023-01-18 NOTE — Discharge Instructions (Signed)
Follow up with your family doc in the office.  

## 2023-01-18 NOTE — ED Notes (Signed)
Pt ambulating from room to bathroom throughout shift. No c/o pain or distress

## 2023-01-25 ENCOUNTER — Encounter (HOSPITAL_COMMUNITY): Payer: No Typology Code available for payment source

## 2023-01-26 ENCOUNTER — Encounter (HOSPITAL_COMMUNITY): Payer: Self-pay

## 2023-01-26 ENCOUNTER — Encounter (HOSPITAL_COMMUNITY): Payer: No Typology Code available for payment source

## 2023-02-20 ENCOUNTER — Ambulatory Visit (HOSPITAL_BASED_OUTPATIENT_CLINIC_OR_DEPARTMENT_OTHER): Payer: No Typology Code available for payment source | Admitting: Physical Therapy

## 2023-02-21 ENCOUNTER — Other Ambulatory Visit: Payer: Self-pay

## 2023-02-21 ENCOUNTER — Encounter (HOSPITAL_BASED_OUTPATIENT_CLINIC_OR_DEPARTMENT_OTHER): Payer: Self-pay | Admitting: Physical Therapy

## 2023-02-21 ENCOUNTER — Ambulatory Visit (HOSPITAL_BASED_OUTPATIENT_CLINIC_OR_DEPARTMENT_OTHER): Payer: No Typology Code available for payment source | Attending: Internal Medicine | Admitting: Physical Therapy

## 2023-02-21 DIAGNOSIS — R2689 Other abnormalities of gait and mobility: Secondary | ICD-10-CM | POA: Diagnosis present

## 2023-02-21 DIAGNOSIS — R296 Repeated falls: Secondary | ICD-10-CM | POA: Diagnosis not present

## 2023-02-21 DIAGNOSIS — R531 Weakness: Secondary | ICD-10-CM | POA: Insufficient documentation

## 2023-02-21 DIAGNOSIS — M6281 Muscle weakness (generalized): Secondary | ICD-10-CM

## 2023-02-21 NOTE — Therapy (Signed)
OUTPATIENT PHYSICAL THERAPY LOWER EXTREMITY EVALUATION   Patient Name: Francis Cardenas MRN: [id number] DOB:Feb 27, 1951, 72 y.o., male Today's Date: 02/21/2023  END OF SESSION:  PT End of Session - 02/21/23 0911     Visit Number 1    Number of Visits 16    Date for PT Re-Evaluation 04/18/23    PT Start Time 0855    PT Stop Time 0930    PT Time Calculation (min) 35 min    Activity Tolerance Patient tolerated treatment well    Behavior During Therapy [facility] for tasks assessed/performed             Past Medical History:  Diagnosis Date   Alcohol dependence (HCC)    BPH (benign prostatic hyperplasia)    Cancer (HCC)    prostate   COPD (chronic obstructive pulmonary disease) (HCC)    Diabetes mellitus without complication (HCC)    Dyspnea on exertion 07/14/2022   Elevated troponin 04/06/2019   Fall at home, initial encounter 09/14/2022   Hypercholesteremia    Hypertension    OSA on CPAP    Past Surgical History:  Procedure Laterality Date   ACHILLES TENDON REPAIR Left    KNEE ARTHROSCOPY Left    RIGHT/LEFT HEART CATH AND CORONARY ANGIOGRAPHY N/A 04/09/2019   Procedure: RIGHT/LEFT HEART CATH AND CORONARY ANGIOGRAPHY;  Surgeon: Swazil[place], Peter M, MD;  Location: [facility];  Service: Cardiovascular;  Laterality: N/A;   TONSILLECTOMY     WRIST FRACTURE SURGERY Left    Patient Active Problem List   Diagnosis Date Noted   Acute exacerbation of CHF (congestive heart failure) (HCC) 12/25/2022   Acute on chronic diastolic CHF (congestive heart failure) (HCC) 12/24/2022   Chronic kidney disease, stage 3 (HCC) 12/24/2022   Anxiety state 12/24/2022   BPH (benign prostatic hyperplasia) 09/14/2022   Ambulatory dysfunction 09/14/2022   Acute renal failure superimposed on stage 3a chronic kidney disease (HCC) 06/15/2022   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   Chronic gout of multiple sites 08/03/2021   Pressure injury of skin 05/15/2021   Prostate cancer (HCC)  05/14/2021   Persistent atrial fibrillation (HCC) 05/14/2021   Morbid obesity (HCC) 12/12/2018   (HFpEF) heart failure with preserved ejection fraction (HCC) 12/10/2018   Multiple rib fractures 12/10/2018   OSA (obstructive sleep apnea) 12/10/2018   Chest pain syndrome 12/30/2017   Carcinoma of prostate (HCC) 08/02/2016   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   GERD (gastroesophageal reflux disease) 08/02/2016   Mixed hyperlipidemia 08/02/2016   Peripheral neuropathy 08/02/2016   Steatosis of liver 08/02/2016   Type 2 diabetes mellitus with obesity (HCC) 08/02/2016   Benign essential hypertension 06/11/2013   Chronic pain due to trauma 06/11/2013   Alcohol dependence (HCC) 03/26/2013   PTSD (post-traumatic stress disorder) 03/26/2013   Depressive disorder 03/24/2013    PCP: Benjiman Core  REFERRING PROVIDER: Lawana Pai- Salli Real  REFERRING DIAG:  Diagnosis  R53.1 (ICD-10-CM) - Weakness    THERAPY DIAG:  Other abnormalities of gait and mobility  Muscle weakness (generalized)  Repeated falls  Rationale for Evaluation and Treatment: Rehabilitation  ONSET DATE: Increasing falls over the past year   SUBJECTIVE:   SUBJECTIVE STATEMENT: The patient has a history of falling. He reports he had   PERTINENT HISTORY: COPD. DMII, CHF. Left achilles repair, Knee arthroscopy ( 1980) PAIN:  Are you having pain? Yes: NPRS scale: 3/10 Pain location: bilateral knees Pain description: aching  Aggravating factors: standing/ walking/ sit to stand transfer Relieving  factors: rest   PRECAUTIONS: Fall  RED FLAGS: None   WEIGHT BEARING RESTRICTIONS: No  FALLS:  Has patient fallen in last 6 months? Yes. Number of falls 7 falls this year   LIVING ENVIRONMENT: A few steps from the garage 6 steps up into the house from the porch. Patient can not do  OCCUPATION:  Retired    PLOF: Requires assistive device for independence  PATIENT GOALS:  To have less falls     NEXT MD VISIT:   October 24th    OBJECTIVE:  Note: Objective measures were completed at Evaluation unless otherwise noted.  DIAGNOSTIC FINDINGS:  CT of lumbar and thoraicc spine  CT LUMBAR SPINE FINDINGS   Segmentation: 5 lumbar type vertebrae   Alignment: No traumatic malalignment.   Vertebrae: No acute fracture or focal pathologic process.   Paraspinal and other soft tissues: Atheromatous calcification. Partial coverage of full urinary bladder.   Disc levels: Disc space narrowing and endplate ridging at L2-3 and below. Degenerative facet spurring at the same levels, greatest at L3-4 where there is compressive spinal stenosis.   IMPRESSION: No evidence of acute injury to the thoracic or lumbar spine.  PATIENT SURVEYS:  FOTO    COGNITION: Overall cognitive status: Within functional limits for tasks assessed     SENSATION: WFL  EDEMA:  Bilateral LE edema   MUSCLE LENGTH:   POSTURE: rounded shoulders, forward head, posterior pelvic tilt, and flexed trunk   PALPATION:    LOWER EXTREMITY MMT:  MMT Right eval Left eval  Hip flexion 17.3 17.2  Hip extension    Hip abduction 36.7 36.5  Hip adduction    Hip internal rotation    Hip external rotation    Knee flexion    Knee extension 24.4 23.6  Ankle dorsiflexion    Ankle plantarflexion    Ankle inversion    Ankle eversion     (Blank rows = not tested)   FUNCTIONAL TESTS:  5x sit to stand    GAIT: Using rolling walker, limited distances. Patient became SOB walking more than 50'.  TODAY'S TREATMENT:                                                                                                                              DATE:  unable to give HEP 2nd to time    PATIENT EDUCATION:  Education details: HEP, symptom management,  Person educated: Patient Education method: Explanation, Demonstration, Tactile cues, Verbal cues, and Handouts Education comprehension: verbalized understanding,  returned demonstration, verbal cues required, tactile cues required, and needs further education  HOME EXERCISE PROGRAM: To be established.   ASSESSMENT:  CLINICAL IMPRESSION: Patient is a 72 year old male who presents to therapy with frequent falls and bilateral knee pain.  He feels like at times his legs just give out from under him.  For years he had 1-2 falls a year.  Over the most recent years had 7 falls.  Presents with  baseline mobility deficits.  He uses a walker for primary mobility.  He has a long history of knee issues.  He has decree strength in all major leg muscle groups.  Patient reports he can only ambulate about 150 feet before he becomes fatigued.  He has a limited 5 times sit to stand test time puts him at a fall risk.  Will perform Berg testing next visit as he did not have time today.  Baseline balance testing showed increased difficulty with balance with eyes closed activities.  He would benefit from skilled therapy to decrease fall risk and improve ability to perform daily tasks safely OBJECTIVE IMPAIRMENTS: Abnormal gait, decreased activity tolerance, decreased endurance, decreased mobility, difficulty walking, decreased ROM, decreased strength, increased edema, postural dysfunction, obesity, and pain.   ACTIVITY LIMITATIONS: carrying, standing, squatting, stairs, transfers, and locomotion level  PARTICIPATION LIMITATIONS: meal prep, cleaning, laundry, community activity, occupation, and yard work  PERSONAL FACTORS: 3+ comorbidities: CHF that causes swelling in his legs; OA; Bilateral knee pain   are also affecting patient's functional outcome.   REHAB POTENTIAL: Good  CLINICAL DECISION MAKING: Evolving/moderate complexity  EVALUATION COMPLEXITY: Moderate   GOALS: Goals reviewed with patient? No  SHORT TERM GOALS: Target date: 03/21/2023   Therapy will assess Berg balance score Baseline: Goal status: INITIAL  2.  Patient will demonstrate tandem stance with  eyes closed with standby assist Baseline:  Goal status: INITIAL  3.  Patient will increase gross bilateral lower extremity strength by 5 pounds Baseline:  Goal status: INITIAL  4.  Patient will reduce 5 times sit to stand test by 5 seconds Baseline:  Goal status: INITIAL   LONG TERM GOALS: Target date: 04/18/2023    Patient will ambulate 300 feet without increased knee pain without falling and without increased dyspnea  Baseline:  Goal status: INITIAL  2.  Patient will go up and down 6 steps without difficulty in order to go up his porch Baseline:  Goal status: INITIAL  3.  Patient will have complete exercise program Baseline:  Goal status: INITIAL    PLAN:  PT FREQUENCY: 2x/week  PT DURATION: 8 weeks  PLANNED INTERVENTIONS: Therapeutic exercises, Therapeutic activity, Neuromuscular re-education, Balance training, Gait training, Patient/Family education, Self Care, Joint mobilization, Stair training, DME instructions, Aquatic Therapy, Dry Needling, Electrical stimulation, Cryotherapy, Moist heat, Taping, Manual therapy, and Re-evaluation.   PLAN FOR NEXT SESSION: Consider endurance exercises such as NuStep or exercise bike.  Monitor dyspnea with cardiovascular exercises.  Consider seated lower extremity or upper extremity series.  Consider Berg balance test.  Therapy unable to provide home exercise program this visit secondary to time.  Consider sit to stand transfers.  Sit to stand transfers at this time because of knee pain.  Consider higher level balance activity as tolerated including Airex, hurdles, and coordination exercises.   Dessie Coma, PT 02/21/2023, 12:59 PM

## 2023-03-03 ENCOUNTER — Ambulatory Visit (HOSPITAL_BASED_OUTPATIENT_CLINIC_OR_DEPARTMENT_OTHER): Payer: No Typology Code available for payment source | Admitting: Physical Therapy

## 2023-03-03 DIAGNOSIS — M6281 Muscle weakness (generalized): Secondary | ICD-10-CM

## 2023-03-03 DIAGNOSIS — R531 Weakness: Secondary | ICD-10-CM | POA: Diagnosis not present

## 2023-03-03 DIAGNOSIS — R296 Repeated falls: Secondary | ICD-10-CM

## 2023-03-03 DIAGNOSIS — R2689 Other abnormalities of gait and mobility: Secondary | ICD-10-CM

## 2023-03-03 NOTE — Therapy (Addendum)
OUTPATIENT PHYSICAL THERAPY LOWER EXTREMITY EVALUATION   Patient Name: Francis Cardenas MRN: [id number] DOB:11-07-1950, 72 y.o., male Today's Date: 03/03/2023  END OF SESSION:  PT End of Session - 03/03/23 0837     Visit Number 2    Number of Visits 16    Date for PT Re-Evaluation 04/18/23    PT Start Time 0837    PT Stop Time 0920    PT Time Calculation (min) 43 min    Activity Tolerance Patient tolerated treatment well    Behavior During Therapy [facility] for tasks assessed/performed              Past Medical History:  Diagnosis Date   Alcohol dependence (HCC)    BPH (benign prostatic hyperplasia)    Cancer (HCC)    prostate   COPD (chronic obstructive pulmonary disease) (HCC)    Diabetes mellitus without complication (HCC)    Dyspnea on exertion 07/14/2022   Elevated troponin 04/06/2019   Fall at home, initial encounter 09/14/2022   Hypercholesteremia    Hypertension    OSA on CPAP    Past Surgical History:  Procedure Laterality Date   ACHILLES TENDON REPAIR Left    KNEE ARTHROSCOPY Left    RIGHT/LEFT HEART CATH AND CORONARY ANGIOGRAPHY N/A 04/09/2019   Procedure: RIGHT/LEFT HEART CATH AND CORONARY ANGIOGRAPHY;  Surgeon: [place], Peter M, MD;  Location: [facility];  Service: Cardiovascular;  Laterality: N/A;   TONSILLECTOMY     WRIST FRACTURE SURGERY Left    Patient Active Problem List   Diagnosis Date Noted   Acute exacerbation of CHF (congestive heart failure) (HCC) 12/25/2022   Acute on chronic diastolic CHF (congestive heart failure) (HCC) 12/24/2022   Chronic kidney disease, stage 3 (HCC) 12/24/2022   Anxiety state 12/24/2022   BPH (benign prostatic hyperplasia) 09/14/2022   Ambulatory dysfunction 09/14/2022   Acute renal failure superimposed on stage 3a chronic kidney disease (HCC) 06/15/2022   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   Chronic gout of multiple sites 08/03/2021   Pressure injury of skin 05/15/2021   Prostate cancer (HCC)  05/14/2021   Persistent atrial fibrillation (HCC) 05/14/2021   Morbid obesity (HCC) 12/12/2018   (HFpEF) heart failure with preserved ejection fraction (HCC) 12/10/2018   Multiple rib fractures 12/10/2018   OSA (obstructive sleep apnea) 12/10/2018   Chest pain syndrome 12/30/2017   Carcinoma of prostate (HCC) 08/02/2016   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   GERD (gastroesophageal reflux disease) 08/02/2016   Mixed hyperlipidemia 08/02/2016   Peripheral neuropathy 08/02/2016   Steatosis of liver 08/02/2016   Type 2 diabetes mellitus with obesity (HCC) 08/02/2016   Benign essential hypertension 06/11/2013   Chronic pain due to trauma 06/11/2013   Alcohol dependence (HCC) 03/26/2013   PTSD (post-traumatic stress disorder) 03/26/2013   Depressive disorder 03/24/2013    PCP: Benjiman Core  REFERRING PROVIDER: Lawana Pai- Salli Real  REFERRING DIAG:  Diagnosis  R53.1 (ICD-10-CM) - Weakness    THERAPY DIAG:  Other abnormalities of gait and mobility  Muscle weakness (generalized)  Repeated falls  Rationale for Evaluation and Treatment: Rehabilitation  ONSET DATE: Increasing falls over the past year   SUBJECTIVE:   SUBJECTIVE STATEMENT: The patient report no current falls. He reports pain in the front of his legs and into his calves  PERTINENT HISTORY: COPD. DMII, CHF. Left achilles repair, Knee arthroscopy ( 1980) PAIN:  Are you having pain? Yes: NPRS scale: 3/10 Pain location: bilateral knees Pain description: aching  Aggravating  factors: standing/ walking/ sit to stand transfer Relieving factors: rest   PRECAUTIONS: Fall  RED FLAGS: None   WEIGHT BEARING RESTRICTIONS: No  FALLS:  Has patient fallen in last 6 months? Yes. Number of falls 7 falls this year   LIVING ENVIRONMENT: A few steps from the garage 6 steps up into the house from the porch. Patient can not do  OCCUPATION:  Retired    PLOF: Requires assistive device for  independence  PATIENT GOALS:  To have less falls    NEXT MD VISIT:   October 24th    OBJECTIVE:  Note: Objective measures were completed at Evaluation unless otherwise noted.  DIAGNOSTIC FINDINGS:  CT of lumbar and thoraicc spine  CT LUMBAR SPINE FINDINGS   Segmentation: 5 lumbar type vertebrae   Alignment: No traumatic malalignment.   Vertebrae: No acute fracture or focal pathologic process.   Paraspinal and other soft tissues: Atheromatous calcification. Partial coverage of full urinary bladder.   Disc levels: Disc space narrowing and endplate ridging at L2-3 and below. Degenerative facet spurring at the same levels, greatest at L3-4 where there is compressive spinal stenosis.   IMPRESSION: No evidence of acute injury to the thoracic or lumbar spine.  PATIENT SURVEYS:  FOTO    COGNITION: Overall cognitive status: Within functional limits for tasks assessed     SENSATION: WFL  EDEMA:  Bilateral LE edema   MUSCLE LENGTH:   POSTURE: rounded shoulders, forward head, posterior pelvic tilt, and flexed trunk   PALPATION:    LOWER EXTREMITY MMT:  MMT Right eval Left eval  Hip flexion 17.3 17.2  Hip extension    Hip abduction 36.7 36.5  Hip adduction    Hip internal rotation    Hip external rotation    Knee flexion    Knee extension 24.4 23.6  Ankle dorsiflexion    Ankle plantarflexion    Ankle inversion    Ankle eversion     (Blank rows = not tested)   FUNCTIONAL TESTS:  5x sit to stand    GAIT: Using rolling walker, limited distances. Patient became SOB walking more than 50'.  TODAY'S TREATMENT:                                                                                                                              DATE:    10/11  Nu-step: 4 min L2 minor pain noted in his left knee   Hip abduction yellow 3x10  Heel raise 3x10  Seated march 3x10    Bilateral er 2x10 yellow  Bilateral horizontal abduction 2x10 yellow   Bilateral flexion  2x10 yellow band   Sit to stand 4 x 3 sets with standby assist   PATIENT EDUCATION:  Education details: HEP, symptom management,  Person educated: Patient Education method: Explanation, Demonstration, Tactile cues, Verbal cues, and Handouts Education comprehension: verbalized understanding, returned demonstration, verbal cues required, tactile cues required, and needs further education  HOME EXERCISE  PROGRAM: To be established.  Access Code: [email or web address] URL: [email or web address] Date: 03/03/2023 Prepared by: Lorayne Bender  Exercises - Seated March  - 1 x daily - 7 x weekly - 3 sets - 10 reps - Seated Hip Abduction with Resistance  - 1 x daily - 7 x weekly - 3 sets - 10 reps - Seated Heel Raise  - 1 x daily - 7 x weekly - 3 sets - 10 reps - Shoulder External Rotation and Scapular Retraction with Resistance  - 1 x daily - 7 x weekly - 3 sets - 10 reps - Low Horizontal Abduction with Resistance  - 1 x daily - 7 x weekly - 3 sets - 10 reps  ASSESSMENT:  CLINICAL IMPRESSION: The patient tolerated treatment well. We updated his HEP. He was given several LE and UE exercises to work on at home. We also worked on balance and transfers.  He is advised to adjust his sets and reps according to how he feels.  He is encouraged to try to perform his exercises over the weekend.  Eval:  Patient is a 72 year old male who presents to therapy with frequent falls and bilateral knee pain.  He feels like at times his legs just give out from under him.  For years he had 1-2 falls a year.  Over the most recent years had 7 falls.  Presents with baseline mobility deficits.  He uses a walker for primary mobility.  He has a long history of knee issues.  He has decree strength in all major leg muscle groups.  Patient reports he can only ambulate about 150 feet before he becomes fatigued.  He has a limited 5 times sit to stand test time puts him at a fall risk.  Will perform Berg  testing next visit as he did not have time today.  Baseline balance testing showed increased difficulty with balance with eyes closed activities.  He would benefit from skilled therapy to decrease fall risk and improve ability to perform daily tasks safely OBJECTIVE IMPAIRMENTS: Abnormal gait, decreased activity tolerance, decreased endurance, decreased mobility, difficulty walking, decreased ROM, decreased strength, increased edema, postural dysfunction, obesity, and pain.   ACTIVITY LIMITATIONS: carrying, standing, squatting, stairs, transfers, and locomotion level  PARTICIPATION LIMITATIONS: meal prep, cleaning, laundry, community activity, occupation, and yard work  PERSONAL FACTORS: 3+ comorbidities: CHF that causes swelling in his legs; OA; Bilateral knee pain   are also affecting patient's functional outcome.   REHAB POTENTIAL: Good  CLINICAL DECISION MAKING: Evolving/moderate complexity  EVALUATION COMPLEXITY: Moderate   GOALS: Goals reviewed with patient? No  SHORT TERM GOALS: Target date: 03/21/2023   Therapy will assess Berg balance score Baseline: Goal status: INITIAL  2.  Patient will demonstrate tandem stance with eyes closed with standby assist Baseline:  Goal status: INITIAL  3.  Patient will increase gross bilateral lower extremity strength by 5 pounds Baseline:  Goal status: INITIAL  4.  Patient will reduce 5 times sit to stand test by 5 seconds Baseline:  Goal status: INITIAL   LONG TERM GOALS: Target date: 04/18/2023    Patient will ambulate 300 feet without increased knee pain without falling and without increased dyspnea  Baseline:  Goal status: INITIAL  2.  Patient will go up and down 6 steps without difficulty in order to go up his porch Baseline:  Goal status: INITIAL  3.  Patient will have complete exercise program Baseline:  Goal status: INITIAL    PLAN:  PT FREQUENCY: 2x/week  PT DURATION: 8 weeks  PLANNED INTERVENTIONS:  Therapeutic exercises, Therapeutic activity, Neuromuscular re-education, Balance training, Gait training, Patient/Family education, Self Care, Joint mobilization, Stair training, DME instructions, Aquatic Therapy, Dry Needling, Electrical stimulation, Cryotherapy, Moist heat, Taping, Manual therapy, and Re-evaluation.   PLAN FOR NEXT SESSION: Consider endurance exercises such as NuStep or exercise bike.  Monitor dyspnea with cardiovascular exercises.  Consider seated lower extremity or upper extremity series.  Consider Berg balance test.  Therapy unable to provide home exercise program this visit secondary to time.  Consider sit to stand transfers.  Sit to stand transfers at this time because of knee pain.  Consider higher level balance activity as tolerated including Airex, hurdles, and coordination exercises.   Dessie Coma, PT 03/03/2023, 9:35 AM

## 2023-03-06 ENCOUNTER — Encounter (HOSPITAL_BASED_OUTPATIENT_CLINIC_OR_DEPARTMENT_OTHER): Payer: No Typology Code available for payment source

## 2023-03-06 ENCOUNTER — Encounter (HOSPITAL_BASED_OUTPATIENT_CLINIC_OR_DEPARTMENT_OTHER): Payer: Self-pay

## 2023-03-09 ENCOUNTER — Encounter (HOSPITAL_BASED_OUTPATIENT_CLINIC_OR_DEPARTMENT_OTHER): Payer: No Typology Code available for payment source

## 2023-03-09 ENCOUNTER — Encounter (HOSPITAL_BASED_OUTPATIENT_CLINIC_OR_DEPARTMENT_OTHER): Payer: Self-pay

## 2023-03-14 ENCOUNTER — Encounter (HOSPITAL_BASED_OUTPATIENT_CLINIC_OR_DEPARTMENT_OTHER): Payer: Self-pay

## 2023-03-14 ENCOUNTER — Ambulatory Visit (HOSPITAL_BASED_OUTPATIENT_CLINIC_OR_DEPARTMENT_OTHER): Payer: No Typology Code available for payment source

## 2023-03-14 DIAGNOSIS — R2689 Other abnormalities of gait and mobility: Secondary | ICD-10-CM

## 2023-03-14 DIAGNOSIS — R531 Weakness: Secondary | ICD-10-CM | POA: Diagnosis not present

## 2023-03-14 DIAGNOSIS — R296 Repeated falls: Secondary | ICD-10-CM

## 2023-03-14 DIAGNOSIS — M6281 Muscle weakness (generalized): Secondary | ICD-10-CM

## 2023-03-14 NOTE — Therapy (Signed)
OUTPATIENT PHYSICAL THERAPY LOWER EXTREMITY EVALUATION   Patient Name: Francis Cardenas MRN: [id number] DOB:Jun 12, 1950, 72 y.o., male Today's Date: 03/14/2023  END OF SESSION:  PT End of Session - 03/14/23 0806     Visit Number 3    Number of Visits 16    Date for PT Re-Evaluation 04/18/23    PT Start Time 0801    PT Stop Time 0846    PT Time Calculation (min) 45 min    Activity Tolerance Patient tolerated treatment well    Behavior During Therapy [facility] for tasks assessed/performed               Past Medical History:  Diagnosis Date   Alcohol dependence (HCC)    BPH (benign prostatic hyperplasia)    Cancer (HCC)    prostate   COPD (chronic obstructive pulmonary disease) (HCC)    Diabetes mellitus without complication (HCC)    Dyspnea on exertion 07/14/2022   Elevated troponin 04/06/2019   Fall at home, initial encounter 09/14/2022   Hypercholesteremia    Hypertension    OSA on CPAP    Past Surgical History:  Procedure Laterality Date   ACHILLES TENDON REPAIR Left    KNEE ARTHROSCOPY Left    RIGHT/LEFT HEART CATH AND CORONARY ANGIOGRAPHY N/A 04/09/2019   Procedure: RIGHT/LEFT HEART CATH AND CORONARY ANGIOGRAPHY;  Surgeon: [place], Peter M, MD;  Location: [facility];  Service: Cardiovascular;  Laterality: N/A;   TONSILLECTOMY     WRIST FRACTURE SURGERY Left    Patient Active Problem List   Diagnosis Date Noted   Acute exacerbation of CHF (congestive heart failure) (HCC) 12/25/2022   Acute on chronic diastolic CHF (congestive heart failure) (HCC) 12/24/2022   Chronic kidney disease, stage 3 (HCC) 12/24/2022   Anxiety state 12/24/2022   BPH (benign prostatic hyperplasia) 09/14/2022   Ambulatory dysfunction 09/14/2022   Acute renal failure superimposed on stage 3a chronic kidney disease (HCC) 06/15/2022   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   Chronic gout of multiple sites 08/03/2021   Pressure injury of skin 05/15/2021   Prostate cancer (HCC)  05/14/2021   Persistent atrial fibrillation (HCC) 05/14/2021   Morbid obesity (HCC) 12/12/2018   (HFpEF) heart failure with preserved ejection fraction (HCC) 12/10/2018   Multiple rib fractures 12/10/2018   OSA (obstructive sleep apnea) 12/10/2018   Chest pain syndrome 12/30/2017   Carcinoma of prostate (HCC) 08/02/2016   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   GERD (gastroesophageal reflux disease) 08/02/2016   Mixed hyperlipidemia 08/02/2016   Peripheral neuropathy 08/02/2016   Steatosis of liver 08/02/2016   Type 2 diabetes mellitus with obesity (HCC) 08/02/2016   Benign essential hypertension 06/11/2013   Chronic pain due to trauma 06/11/2013   Alcohol dependence (HCC) 03/26/2013   PTSD (post-traumatic stress disorder) 03/26/2013   Depressive disorder 03/24/2013    PCP: Benjiman Core  REFERRING PROVIDER: Lawana Pai- Salli Real  REFERRING DIAG:  Diagnosis  R53.1 (ICD-10-CM) - Weakness    THERAPY DIAG:  Other abnormalities of gait and mobility  Muscle weakness (generalized)  Repeated falls  Rationale for Evaluation and Treatment: Rehabilitation  ONSET DATE: Increasing falls over the past year   SUBJECTIVE:   SUBJECTIVE STATEMENT: Pt reports he missed his last 2 visits due to other appointments. Arrives with rollator. He denies recent falls. He reports "my knees always bother me."   PERTINENT HISTORY: COPD. DMII, CHF. Left achilles repair, Knee arthroscopy ( 1980) PAIN:  Are you having pain? Yes: NPRS scale: 3/10  Pain location: bilateral knees Pain description: aching  Aggravating factors: standing/ walking/ sit to stand transfer Relieving factors: rest   PRECAUTIONS: Fall  RED FLAGS: None   WEIGHT BEARING RESTRICTIONS: No  FALLS:  Has patient fallen in last 6 months? Yes. Number of falls 7 falls this year   LIVING ENVIRONMENT: A few steps from the garage 6 steps up into the house from the porch. Patient can not do  OCCUPATION:  Retired     PLOF: Requires assistive device for independence  PATIENT GOALS:  To have less falls    NEXT MD VISIT:   October 24th    OBJECTIVE:  Note: Objective measures were completed at Evaluation unless otherwise noted.  DIAGNOSTIC FINDINGS:  CT of lumbar and thoraicc spine  CT LUMBAR SPINE FINDINGS   Segmentation: 5 lumbar type vertebrae   Alignment: No traumatic malalignment.   Vertebrae: No acute fracture or focal pathologic process.   Paraspinal and other soft tissues: Atheromatous calcification. Partial coverage of full urinary bladder.   Disc levels: Disc space narrowing and endplate ridging at L2-3 and below. Degenerative facet spurring at the same levels, greatest at L3-4 where there is compressive spinal stenosis.   IMPRESSION: No evidence of acute injury to the thoracic or lumbar spine.  PATIENT SURVEYS:  FOTO    COGNITION: Overall cognitive status: Within functional limits for tasks assessed     SENSATION: WFL  EDEMA:  Bilateral LE edema   MUSCLE LENGTH:   POSTURE: rounded shoulders, forward head, posterior pelvic tilt, and flexed trunk   PALPATION:    LOWER EXTREMITY MMT:  MMT Right eval Left eval  Hip flexion 17.3 17.2  Hip extension    Hip abduction 36.7 36.5  Hip adduction    Hip internal rotation    Hip external rotation    Knee flexion    Knee extension 24.4 23.6  Ankle dorsiflexion    Ankle plantarflexion    Ankle inversion    Ankle eversion     (Blank rows = not tested)   FUNCTIONAL TESTS:  5x sit to stand    GAIT: Using rolling walker, limited distances. Patient became SOB walking more than 50'.  TODAY'S TREATMENT:                                                                                                                              DATE:    10/22  Nu-step: 5 min L4   Standing HR/TR 2x10 Standing march 2x10 Romberg with EO x30sec; with EC 3x30sec SBA  Seated march 3x10  2# weights Seated LAQ 2#  2x10ea Sit to stand from chair with airex 2x10   Bilateral er 2x10 yellow  Bilateral horizontal abduction 2x10 yellow     10/11  Nu-step: 4 min L2 minor pain noted in his left knee   Hip abduction yellow 3x10  Heel raise 3x10  Seated march 3x10    Bilateral er 2x10 yellow  Bilateral  horizontal abduction 2x10 yellow  Bilateral flexion  2x10 yellow band   Sit to stand 4 x 3 sets with standby assist   PATIENT EDUCATION:  Education details: HEP, symptom management,  Person educated: Patient Education method: Explanation, Demonstration, Tactile cues, Verbal cues, and Handouts Education comprehension: verbalized understanding, returned demonstration, verbal cues required, tactile cues required, and needs further education  HOME EXERCISE PROGRAM: To be established.  Access Code: [email or web address] URL: [email or web address] Date: 03/03/2023 Prepared by: Lorayne Bender  Exercises - Seated March  - 1 x daily - 7 x weekly - 3 sets - 10 reps - Seated Hip Abduction with Resistance  - 1 x daily - 7 x weekly - 3 sets - 10 reps - Seated Heel Raise  - 1 x daily - 7 x weekly - 3 sets - 10 reps - Shoulder External Rotation and Scapular Retraction with Resistance  - 1 x daily - 7 x weekly - 3 sets - 10 reps - Low Horizontal Abduction with Resistance  - 1 x daily - 7 x weekly - 3 sets - 10 reps  ASSESSMENT:  CLINICAL IMPRESSION: Good tolerance for progressions to therex program. Able to initiate additional standing exercise without significant complaint. He does require cuing to stay on task as well as for postural corrections. Able to complete increased repetitions with sit to stands today with good form observed following initial cues. Will continue to progress as tolerated.   Eval:  Patient is a 72 year old male who presents to therapy with frequent falls and bilateral knee pain.  He feels like at times his legs just give out from under him.  For years he had 1-2 falls a year.   Over the most recent years had 7 falls.  Presents with baseline mobility deficits.  He uses a walker for primary mobility.  He has a long history of knee issues.  He has decree strength in all major leg muscle groups.  Patient reports he can only ambulate about 150 feet before he becomes fatigued.  He has a limited 5 times sit to stand test time puts him at a fall risk.  Will perform Berg testing next visit as he did not have time today.  Baseline balance testing showed increased difficulty with balance with eyes closed activities.  He would benefit from skilled therapy to decrease fall risk and improve ability to perform daily tasks safely OBJECTIVE IMPAIRMENTS: Abnormal gait, decreased activity tolerance, decreased endurance, decreased mobility, difficulty walking, decreased ROM, decreased strength, increased edema, postural dysfunction, obesity, and pain.   ACTIVITY LIMITATIONS: carrying, standing, squatting, stairs, transfers, and locomotion level  PARTICIPATION LIMITATIONS: meal prep, cleaning, laundry, community activity, occupation, and yard work  PERSONAL FACTORS: 3+ comorbidities: CHF that causes swelling in his legs; OA; Bilateral knee pain   are also affecting patient's functional outcome.   REHAB POTENTIAL: Good  CLINICAL DECISION MAKING: Evolving/moderate complexity  EVALUATION COMPLEXITY: Moderate   GOALS: Goals reviewed with patient? No  SHORT TERM GOALS: Target date: 03/21/2023   Therapy will assess Berg balance score Baseline: Goal status: INITIAL  2.  Patient will demonstrate tandem stance with eyes closed with standby assist Baseline:  Goal status: INITIAL  3.  Patient will increase gross bilateral lower extremity strength by 5 pounds Baseline:  Goal status: INITIAL  4.  Patient will reduce 5 times sit to stand test by 5 seconds Baseline:  Goal status: INITIAL   LONG TERM GOALS: Target date: 04/18/2023    Patient will ambulate 300 feet without  increased  knee pain without falling and without increased dyspnea  Baseline:  Goal status: INITIAL  2.  Patient will go up and down 6 steps without difficulty in order to go up his porch Baseline:  Goal status: INITIAL  3.  Patient will have complete exercise program Baseline:  Goal status: INITIAL    PLAN:  PT FREQUENCY: 2x/week  PT DURATION: 8 weeks  PLANNED INTERVENTIONS: Therapeutic exercises, Therapeutic activity, Neuromuscular re-education, Balance training, Gait training, Patient/Family education, Self Care, Joint mobilization, Stair training, DME instructions, Aquatic Therapy, Dry Needling, Electrical stimulation, Cryotherapy, Moist heat, Taping, Manual therapy, and Re-evaluation.   PLAN FOR NEXT SESSION: Consider endurance exercises such as NuStep or exercise bike.  Monitor dyspnea with cardiovascular exercises.  Consider seated lower extremity or upper extremity series.  Consider Berg balance test.  Therapy unable to provide home exercise program this visit secondary to time.  Consider sit to stand transfers.  Sit to stand transfers at this time because of knee pain.  Consider higher level balance activity as tolerated including Airex, hurdles, and coordination exercises.   Donnel Saxon Shaivi Rothschild, PTA 03/14/2023, 9:30 AM

## 2023-03-16 ENCOUNTER — Encounter (HOSPITAL_BASED_OUTPATIENT_CLINIC_OR_DEPARTMENT_OTHER): Payer: No Typology Code available for payment source | Admitting: Physical Therapy

## 2023-03-21 ENCOUNTER — Encounter (HOSPITAL_BASED_OUTPATIENT_CLINIC_OR_DEPARTMENT_OTHER): Payer: Self-pay

## 2023-03-21 ENCOUNTER — Ambulatory Visit (HOSPITAL_BASED_OUTPATIENT_CLINIC_OR_DEPARTMENT_OTHER): Payer: No Typology Code available for payment source

## 2023-03-21 DIAGNOSIS — R531 Weakness: Secondary | ICD-10-CM | POA: Diagnosis not present

## 2023-03-21 DIAGNOSIS — R296 Repeated falls: Secondary | ICD-10-CM

## 2023-03-21 DIAGNOSIS — M6281 Muscle weakness (generalized): Secondary | ICD-10-CM

## 2023-03-21 DIAGNOSIS — R2689 Other abnormalities of gait and mobility: Secondary | ICD-10-CM

## 2023-03-21 NOTE — Therapy (Signed)
OUTPATIENT PHYSICAL THERAPY LOWER EXTREMITY EVALUATION   Patient Name: Francis Cardenas MRN: [id number] DOB:[birth year], 72 y.o., male Today's Date: 03/21/2023  END OF SESSION:  PT End of Session - 03/21/23 0804     Visit Number 4    Number of Visits 16    Date for PT Re-Evaluation 04/18/23    PT Start Time 0801    PT Stop Time 0845    PT Time Calculation (min) 44 min    Activity Tolerance Patient tolerated treatment well    Behavior During Therapy [facility] for tasks assessed/performed                Past Medical History:  Diagnosis Date   Alcohol dependence (HCC)    BPH (benign prostatic hyperplasia)    Cancer (HCC)    prostate   COPD (chronic obstructive pulmonary disease) (HCC)    Diabetes mellitus without complication (HCC)    Dyspnea on exertion 07/14/2022   Elevated troponin 04/06/2019   Fall at home, initial encounter 09/14/2022   Hypercholesteremia    Hypertension    OSA on CPAP    Past Surgical History:  Procedure Laterality Date   ACHILLES TENDON REPAIR Left    KNEE ARTHROSCOPY Left    RIGHT/LEFT HEART CATH AND CORONARY ANGIOGRAPHY N/A 04/09/2019   Procedure: RIGHT/LEFT HEART CATH AND CORONARY ANGIOGRAPHY;  Surgeon: [place], Peter M, MD;  Location: [facility];  Service: Cardiovascular;  Laterality: N/A;   TONSILLECTOMY     WRIST FRACTURE SURGERY Left    Patient Active Problem List   Diagnosis Date Noted   Acute exacerbation of CHF (congestive heart failure) (HCC) 12/25/2022   Acute on chronic diastolic CHF (congestive heart failure) (HCC) 12/24/2022   Chronic kidney disease, stage 3 (HCC) 12/24/2022   Anxiety state 12/24/2022   BPH (benign prostatic hyperplasia) 09/14/2022   Ambulatory dysfunction 09/14/2022   Acute renal failure superimposed on stage 3a chronic kidney disease (HCC) 06/15/2022   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   Chronic gout of multiple sites 08/03/2021   Pressure injury of skin 05/15/2021   Prostate cancer (HCC)  05/14/2021   Persistent atrial fibrillation (HCC) 05/14/2021   Morbid obesity (HCC) 12/12/2018   (HFpEF) heart failure with preserved ejection fraction (HCC) 12/10/2018   Multiple rib fractures 12/10/2018   OSA (obstructive sleep apnea) 12/10/2018   Chest pain syndrome 12/30/2017   Carcinoma of prostate (HCC) 08/02/2016   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   GERD (gastroesophageal reflux disease) 08/02/2016   Mixed hyperlipidemia 08/02/2016   Peripheral neuropathy 08/02/2016   Steatosis of liver 08/02/2016   Type 2 diabetes mellitus with obesity (HCC) 08/02/2016   Benign essential hypertension 06/11/2013   Chronic pain due to trauma 06/11/2013   Alcohol dependence (HCC) 03/26/2013   PTSD (post-traumatic stress disorder) 03/26/2013   Depressive disorder 03/24/2013    PCP: Benjiman Core  REFERRING PROVIDER: Lawana Pai- Salli Real  REFERRING DIAG:  Diagnosis  R53.1 (ICD-10-CM) - Weakness    THERAPY DIAG:  Other abnormalities of gait and mobility  Repeated falls  Muscle weakness (generalized)  Rationale for Evaluation and Treatment: Rehabilitation  ONSET DATE: Increasing falls over the past year   SUBJECTIVE:   SUBJECTIVE STATEMENT: Pt reports he is tired today. He states his L knee bothers him after PT. "It pops."   PERTINENT HISTORY: COPD. DMII, CHF. Left achilles repair, Knee arthroscopy ( 1980) PAIN:  Are you having pain? Yes: NPRS scale: 3/10 Pain location: bilateral knees Pain description: aching  Aggravating factors: standing/ walking/ sit to stand transfer Relieving factors: rest   PRECAUTIONS: Fall  RED FLAGS: None   WEIGHT BEARING RESTRICTIONS: No  FALLS:  Has patient fallen in last 6 months? Yes. Number of falls 7 falls this year   LIVING ENVIRONMENT: A few steps from the garage 6 steps up into the house from the porch. Patient can not do  OCCUPATION:  Retired    PLOF: Requires assistive device for independence  PATIENT GOALS:   To have less falls    NEXT MD VISIT:   October 24th    OBJECTIVE:  Note: Objective measures were completed at Evaluation unless otherwise noted.  DIAGNOSTIC FINDINGS:  CT of lumbar and thoraicc spine  CT LUMBAR SPINE FINDINGS   Segmentation: 5 lumbar type vertebrae   Alignment: No traumatic malalignment.   Vertebrae: No acute fracture or focal pathologic process.   Paraspinal and other soft tissues: Atheromatous calcification. Partial coverage of full urinary bladder.   Disc levels: Disc space narrowing and endplate ridging at L2-3 and below. Degenerative facet spurring at the same levels, greatest at L3-4 where there is compressive spinal stenosis.   IMPRESSION: No evidence of acute injury to the thoracic or lumbar spine.  PATIENT SURVEYS:  FOTO    COGNITION: Overall cognitive status: Within functional limits for tasks assessed     SENSATION: WFL  EDEMA:  Bilateral LE edema   MUSCLE LENGTH:   POSTURE: rounded shoulders, forward head, posterior pelvic tilt, and flexed trunk   PALPATION:    LOWER EXTREMITY MMT:  MMT Right eval Left eval Right 10/29 Left  10/29  Hip flexion 17.3 17.2 58.4 62.0  Hip extension      Hip abduction 36.7 36.5 50.3 53.8  Hip adduction      Hip internal rotation      Hip external rotation      Knee flexion      Knee extension 24.4 23.6 56.1 54.8  Ankle dorsiflexion      Ankle plantarflexion      Ankle inversion      Ankle eversion       (Blank rows = not tested)   FUNCTIONAL TESTS:  10/29: 5x sit to stand 11.58seconds   GAIT: Using rolling walker, limited distances. Patient became SOB walking more than 50'.  TODAY'S TREATMENT:                                                                                                                              DATE:     10/29  Nu-step: 5 min L4  Updated strength measures Updated 5xSTS  Standing HR/TR 2x15 Side stepping at rail with RTB at ankles  Standing march  10x10 Romberg with EC 3x30sec SBA Standing march- no Rail- SBA /light CGA x10  Sit to stand from chair with airex 2x10    10/22  Nu-step: 5 min L4   Standing HR/TR 2x10 Standing march 2x10 Romberg with EO  x30sec; with EC 3x30sec SBA  Seated march 3x10  2# weights Seated LAQ 2# 2x10ea Sit to stand from chair with airex 2x10   Bilateral er 2x10 yellow  Bilateral horizontal abduction 2x10 yellow     10/11  Nu-step: 4 min L2 minor pain noted in his left knee   Hip abduction yellow 3x10  Heel raise 3x10  Seated march 3x10    Bilateral er 2x10 yellow  Bilateral horizontal abduction 2x10 yellow  Bilateral flexion  2x10 yellow band   Sit to stand 4 x 3 sets with standby assist   PATIENT EDUCATION:  Education details: HEP, symptom management,  Person educated: Patient Education method: Explanation, Demonstration, Tactile cues, Verbal cues, and Handouts Education comprehension: verbalized understanding, returned demonstration, verbal cues required, tactile cues required, and needs further education  HOME EXERCISE PROGRAM: To be established.  Access Code: [email or web address] URL: [email or web address] Date: 03/03/2023 Prepared by: Lorayne Bender  Exercises - Seated March  - 1 x daily - 7 x weekly - 3 sets - 10 reps - Seated Hip Abduction with Resistance  - 1 x daily - 7 x weekly - 3 sets - 10 reps - Seated Heel Raise  - 1 x daily - 7 x weekly - 3 sets - 10 reps - Shoulder External Rotation and Scapular Retraction with Resistance  - 1 x daily - 7 x weekly - 3 sets - 10 reps - Low Horizontal Abduction with Resistance  - 1 x daily - 7 x weekly - 3 sets - 10 reps  ASSESSMENT:  CLINICAL IMPRESSION: Pt able to ambulate 274ft in clinic using rollator without c/o knee pain. Pt has met strength and 5xSTS goal, demonstrating significant objective progress. Challenged by [facility] tandem stance, though good performance with EO. Utilized gait belt as a precaution. He felt fatigue  in lateral hips with resisted side stepping. Will trial step ups next visit if knees tolerate.   Eval:  Patient is a 72 year old male who presents to therapy with frequent falls and bilateral knee pain.  He feels like at times his legs just give out from under him.  For years he had 1-2 falls a year.  Over the most recent years had 7 falls.  Presents with baseline mobility deficits.  He uses a walker for primary mobility.  He has a long history of knee issues.  He has decree strength in all major leg muscle groups.  Patient reports he can only ambulate about 150 feet before he becomes fatigued.  He has a limited 5 times sit to stand test time puts him at a fall risk.  Will perform Berg testing next visit as he did not have time today.  Baseline balance testing showed increased difficulty with balance with eyes closed activities.  He would benefit from skilled therapy to decrease fall risk and improve ability to perform daily tasks safely OBJECTIVE IMPAIRMENTS: Abnormal gait, decreased activity tolerance, decreased endurance, decreased mobility, difficulty walking, decreased ROM, decreased strength, increased edema, postural dysfunction, obesity, and pain.   ACTIVITY LIMITATIONS: carrying, standing, squatting, stairs, transfers, and locomotion level  PARTICIPATION LIMITATIONS: meal prep, cleaning, laundry, community activity, occupation, and yard work  PERSONAL FACTORS: 3+ comorbidities: CHF that causes swelling in his legs; OA; Bilateral knee pain   are also affecting patient's functional outcome.   REHAB POTENTIAL: Good  CLINICAL DECISION MAKING: Evolving/moderate complexity  EVALUATION COMPLEXITY: Moderate   GOALS: Goals reviewed with patient? Yes  SHORT TERM GOALS: Target date: 03/21/2023   Therapy will assess  Berg balance score Baseline: Goal status: INITIAL  2.  Patient will demonstrate tandem stance with eyes closed with standby assist Baseline:  Goal status: IN PROGRESS (can  compete with EO, required CGA with EC) 10/29  3.  Patient will increase gross bilateral lower extremity strength by 5 pounds Baseline:  Goal status: MET 10/29  4.  Patient will reduce 5 times sit to stand test by 5 seconds Baseline:  Goal status: MET 10/29   LONG TERM GOALS: Target date: 04/18/2023    Patient will ambulate 300 feet without increased knee pain without falling and without increased dyspnea  Baseline:  Goal status: In PROGRESS (289ft today 10/29)  2.  Patient will go up and down 6 steps without difficulty in order to go up his porch Baseline:  Goal status: INITIAL  3.  Patient will have complete exercise program Baseline:  Goal status: INITIAL    PLAN:  PT FREQUENCY: 2x/week  PT DURATION: 8 weeks  PLANNED INTERVENTIONS: Therapeutic exercises, Therapeutic activity, Neuromuscular re-education, Balance training, Gait training, Patient/Family education, Self Care, Joint mobilization, Stair training, DME instructions, Aquatic Therapy, Dry Needling, Electrical stimulation, Cryotherapy, Moist heat, Taping, Manual therapy, and Re-evaluation.   PLAN FOR NEXT SESSION: Consider endurance exercises such as NuStep or exercise bike.  Monitor dyspnea with cardiovascular exercises.  Consider seated lower extremity or upper extremity series.  Consider Berg balance test.  Therapy unable to provide home exercise program this visit secondary to time.  Consider sit to stand transfers.  Sit to stand transfers at this time because of knee pain.  Consider higher level balance activity as tolerated including Airex, hurdles, and coordination exercises.   Donnel Saxon Nazariah Cadet, PTA 03/21/2023, 8:53 AM

## 2023-03-23 ENCOUNTER — Encounter (HOSPITAL_BASED_OUTPATIENT_CLINIC_OR_DEPARTMENT_OTHER): Payer: Self-pay | Admitting: Physical Therapy

## 2023-03-23 ENCOUNTER — Ambulatory Visit (HOSPITAL_BASED_OUTPATIENT_CLINIC_OR_DEPARTMENT_OTHER): Payer: No Typology Code available for payment source | Admitting: Physical Therapy

## 2023-03-23 DIAGNOSIS — M6281 Muscle weakness (generalized): Secondary | ICD-10-CM

## 2023-03-23 DIAGNOSIS — R296 Repeated falls: Secondary | ICD-10-CM

## 2023-03-23 DIAGNOSIS — R531 Weakness: Secondary | ICD-10-CM | POA: Diagnosis not present

## 2023-03-23 DIAGNOSIS — R2689 Other abnormalities of gait and mobility: Secondary | ICD-10-CM

## 2023-03-23 NOTE — Therapy (Signed)
OUTPATIENT PHYSICAL THERAPY LOWER EXTREMITY EVALUATION   Patient Name: Francis Cardenas MRN: [id number] DOB:1950/06/01, 72 y.o., male Today's Date: 03/23/2023  END OF SESSION:  PT End of Session - 03/23/23 0717     Visit Number 5    Number of Visits 16    Date for PT Re-Evaluation 04/18/23    PT Start Time 0716    PT Stop Time 0756    PT Time Calculation (min) 40 min    Activity Tolerance Patient tolerated treatment well    Behavior During Therapy [facility] for tasks assessed/performed                Past Medical History:  Diagnosis Date   Alcohol dependence (HCC)    BPH (benign prostatic hyperplasia)    Cancer (HCC)    prostate   COPD (chronic obstructive pulmonary disease) (HCC)    Diabetes mellitus without complication (HCC)    Dyspnea on exertion 07/14/2022   Elevated troponin 04/06/2019   Fall at home, initial encounter 09/14/2022   Hypercholesteremia    Hypertension    OSA on CPAP    Past Surgical History:  Procedure Laterality Date   ACHILLES TENDON REPAIR Left    KNEE ARTHROSCOPY Left    RIGHT/LEFT HEART CATH AND CORONARY ANGIOGRAPHY N/A 04/09/2019   Procedure: RIGHT/LEFT HEART CATH AND CORONARY ANGIOGRAPHY;  Surgeon: [place], Peter M, MD;  Location: [facility];  Service: Cardiovascular;  Laterality: N/A;   TONSILLECTOMY     WRIST FRACTURE SURGERY Left    Patient Active Problem List   Diagnosis Date Noted   Acute exacerbation of CHF (congestive heart failure) (HCC) 12/25/2022   Acute on chronic diastolic CHF (congestive heart failure) (HCC) 12/24/2022   Chronic kidney disease, stage 3 (HCC) 12/24/2022   Anxiety state 12/24/2022   BPH (benign prostatic hyperplasia) 09/14/2022   Ambulatory dysfunction 09/14/2022   Acute renal failure superimposed on stage 3a chronic kidney disease (HCC) 06/15/2022   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   Chronic gout of multiple sites 08/03/2021   Pressure injury of skin 05/15/2021   Prostate cancer (HCC)  05/14/2021   Persistent atrial fibrillation (HCC) 05/14/2021   Morbid obesity (HCC) 12/12/2018   (HFpEF) heart failure with preserved ejection fraction (HCC) 12/10/2018   Multiple rib fractures 12/10/2018   OSA (obstructive sleep apnea) 12/10/2018   Chest pain syndrome 12/30/2017   Carcinoma of prostate (HCC) 08/02/2016   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   GERD (gastroesophageal reflux disease) 08/02/2016   Mixed hyperlipidemia 08/02/2016   Peripheral neuropathy 08/02/2016   Steatosis of liver 08/02/2016   Type 2 diabetes mellitus with obesity (HCC) 08/02/2016   Benign essential hypertension 06/11/2013   Chronic pain due to trauma 06/11/2013   Alcohol dependence (HCC) 03/26/2013   PTSD (post-traumatic stress disorder) 03/26/2013   Depressive disorder 03/24/2013    PCP: Benjiman Core  REFERRING PROVIDER: Lawana Pai- Salli Real  REFERRING DIAG:  Diagnosis  R53.1 (ICD-10-CM) - Weakness    THERAPY DIAG:  Other abnormalities of gait and mobility  Repeated falls  Muscle weakness (generalized)  Rationale for Evaluation and Treatment: Rehabilitation  ONSET DATE: Increasing falls over the past year   SUBJECTIVE:   SUBJECTIVE STATEMENT: Pt reports this is the first time coming without his rollator. No falls.   PERTINENT HISTORY: COPD. DMII, CHF. Left achilles repair, Knee arthroscopy ( 1980) PAIN:  Are you having pain? Yes: NPRS scale: 7/10 Pain location: bilateral knees Pain description: aching  Aggravating factors: standing/ walking/  sit to stand transfer Relieving factors: rest   PRECAUTIONS: Fall  RED FLAGS: None   WEIGHT BEARING RESTRICTIONS: No  FALLS:  Has patient fallen in last 6 months? Yes. Number of falls 7 falls this year   LIVING ENVIRONMENT: A few steps from the garage 6 steps up into the house from the porch. Patient can not do  OCCUPATION:  Retired    PLOF: Requires assistive device for independence  PATIENT GOALS:  To have  less falls    NEXT MD VISIT:   October 24th    OBJECTIVE:  Note: Objective measures were completed at Evaluation unless otherwise noted.  DIAGNOSTIC FINDINGS:  CT of lumbar and thoraicc spine  CT LUMBAR SPINE FINDINGS   Segmentation: 5 lumbar type vertebrae   Alignment: No traumatic malalignment.   Vertebrae: No acute fracture or focal pathologic process.   Paraspinal and other soft tissues: Atheromatous calcification. Partial coverage of full urinary bladder.   Disc levels: Disc space narrowing and endplate ridging at L2-3 and below. Degenerative facet spurring at the same levels, greatest at L3-4 where there is compressive spinal stenosis.   IMPRESSION: No evidence of acute injury to the thoracic or lumbar spine.  PATIENT SURVEYS:  FOTO    COGNITION: Overall cognitive status: Within functional limits for tasks assessed     SENSATION: WFL  EDEMA:  Bilateral LE edema   MUSCLE LENGTH:   POSTURE: rounded shoulders, forward head, posterior pelvic tilt, and flexed trunk   PALPATION:    LOWER EXTREMITY MMT:  MMT Right eval Left eval Right 10/29 Left  10/29  Hip flexion 17.3 17.2 58.4 62.0  Hip extension      Hip abduction 36.7 36.5 50.3 53.8  Hip adduction      Hip internal rotation      Hip external rotation      Knee flexion      Knee extension 24.4 23.6 56.1 54.8  Ankle dorsiflexion      Ankle plantarflexion      Ankle inversion      Ankle eversion       (Blank rows = not tested)   FUNCTIONAL TESTS:  10/29: 5x sit to stand 11.58seconds   GAIT: Using rolling walker, limited distances. Patient became SOB walking more than 50'.  TODAY'S TREATMENT:                                                                                                                              DATE:   03/23/23 Nustep seat 11, Level 4, 5 minutes for dynamic warm up and conditioning Standing HR/TR 2x15 Standing alternating march 2 x 10 bilateral with unilateral  HHA Standing hip abduction 2 x 10 bilateral NBOS on airex 3 x 30 second holds Standing row RTB 2 x 15 Sit to stand from chair with airex 1x10  10/29  Nu-step: 5 min L4  Updated strength measures Updated 5xSTS  Standing HR/TR 2x15 Side stepping at  rail with RTB at ankles  Standing march 10x10 Romberg with EC 3x30sec SBA Standing march- no Rail- SBA /light CGA x10  Sit to stand from chair with airex 2x10    10/22  Nu-step: 5 min L4   Standing HR/TR 2x10 Standing march 2x10 Romberg with EO x30sec; with EC 3x30sec SBA  Seated march 3x10  2# weights Seated LAQ 2# 2x10ea Sit to stand from chair with airex 2x10   Bilateral er 2x10 yellow  Bilateral horizontal abduction 2x10 yellow     10/11  Nu-step: 4 min L2 minor pain noted in his left knee   Hip abduction yellow 3x10  Heel raise 3x10  Seated march 3x10    Bilateral er 2x10 yellow  Bilateral horizontal abduction 2x10 yellow  Bilateral flexion  2x10 yellow band   Sit to stand 4 x 3 sets with standby assist   PATIENT EDUCATION:  Education details: HEP, symptom management,  Person educated: Patient Education method: Explanation, Demonstration, Tactile cues, Verbal cues, and Handouts Education comprehension: verbalized understanding, returned demonstration, verbal cues required, tactile cues required, and needs further education  HOME EXERCISE PROGRAM: To be established.  Access Code: [email or web address] URL: [email or web address] Date: 03/03/2023 Prepared by: Lorayne Bender  Exercises - Seated March  - 1 x daily - 7 x weekly - 3 sets - 10 reps - Seated Hip Abduction with Resistance  - 1 x daily - 7 x weekly - 3 sets - 10 reps - Seated Heel Raise  - 1 x daily - 7 x weekly - 3 sets - 10 reps - Shoulder External Rotation and Scapular Retraction with Resistance  - 1 x daily - 7 x weekly - 3 sets - 10 reps - Low Horizontal Abduction with Resistance  - 1 x daily - 7 x weekly - 3 sets - 10  reps  ASSESSMENT:  CLINICAL IMPRESSION: Began session with nustep for conditioning and endurance for warm up. Continued with strengthening exercises which are tolerated well. Moderate to high fatigue at times requiring short rest breaks. Mild unsteadiness with static balance exercise. Resisted postural strengthening performed with good mechanics and balance. Patient will continue to benefit from physical therapy in order to improve function and reduce impairment.   Eval:  Patient is a 72 year old male who presents to therapy with frequent falls and bilateral knee pain.  He feels like at times his legs just give out from under him.  For years he had 1-2 falls a year.  Over the most recent years had 7 falls.  Presents with baseline mobility deficits.  He uses a walker for primary mobility.  He has a long history of knee issues.  He has decree strength in all major leg muscle groups.  Patient reports he can only ambulate about 150 feet before he becomes fatigued.  He has a limited 5 times sit to stand test time puts him at a fall risk.  Will perform Berg testing next visit as he did not have time today.  Baseline balance testing showed increased difficulty with balance with eyes closed activities.  He would benefit from skilled therapy to decrease fall risk and improve ability to perform daily tasks safely OBJECTIVE IMPAIRMENTS: Abnormal gait, decreased activity tolerance, decreased endurance, decreased mobility, difficulty walking, decreased ROM, decreased strength, increased edema, postural dysfunction, obesity, and pain.   ACTIVITY LIMITATIONS: carrying, standing, squatting, stairs, transfers, and locomotion level  PARTICIPATION LIMITATIONS: meal prep, cleaning, laundry, community activity, occupation, and yard work  PERSONAL FACTORS: 3+ comorbidities: CHF that  causes swelling in his legs; OA; Bilateral knee pain   are also affecting patient's functional outcome.   REHAB POTENTIAL: Good  CLINICAL  DECISION MAKING: Evolving/moderate complexity  EVALUATION COMPLEXITY: Moderate   GOALS: Goals reviewed with patient? Yes  SHORT TERM GOALS: Target date: 03/21/2023   Therapy will assess Berg balance score Baseline: Goal status: INITIAL  2.  Patient will demonstrate tandem stance with eyes closed with standby assist Baseline:  Goal status: IN PROGRESS (can compete with EO, required CGA with EC) 10/29  3.  Patient will increase gross bilateral lower extremity strength by 5 pounds Baseline:  Goal status: MET 10/29  4.  Patient will reduce 5 times sit to stand test by 5 seconds Baseline:  Goal status: MET 10/29   LONG TERM GOALS: Target date: 04/18/2023    Patient will ambulate 300 feet without increased knee pain without falling and without increased dyspnea  Baseline:  Goal status: In PROGRESS (256ft today 10/29)  2.  Patient will go up and down 6 steps without difficulty in order to go up his porch Baseline:  Goal status: INITIAL  3.  Patient will have complete exercise program Baseline:  Goal status: INITIAL    PLAN:  PT FREQUENCY: 2x/week  PT DURATION: 8 weeks  PLANNED INTERVENTIONS: Therapeutic exercises, Therapeutic activity, Neuromuscular re-education, Balance training, Gait training, Patient/Family education, Self Care, Joint mobilization, Stair training, DME instructions, Aquatic Therapy, Dry Needling, Electrical stimulation, Cryotherapy, Moist heat, Taping, Manual therapy, and Re-evaluation.   PLAN FOR NEXT SESSION: Consider endurance exercises such as NuStep or exercise bike.  Monitor dyspnea with cardiovascular exercises.  Consider seated lower extremity or upper extremity series.  Consider Berg balance test.  Therapy unable to provide home exercise program this visit secondary to time.  Consider sit to stand transfers.  Sit to stand transfers at this time because of knee pain.  Consider higher level balance activity as tolerated including Airex,  hurdles, and coordination exercises.   Reola Mosher Amea Mcphail, PT 03/23/2023, 7:17 AM

## 2023-03-28 ENCOUNTER — Encounter (HOSPITAL_BASED_OUTPATIENT_CLINIC_OR_DEPARTMENT_OTHER): Payer: Self-pay | Admitting: Physical Therapy

## 2023-03-28 ENCOUNTER — Ambulatory Visit (HOSPITAL_BASED_OUTPATIENT_CLINIC_OR_DEPARTMENT_OTHER): Payer: No Typology Code available for payment source | Attending: Internal Medicine | Admitting: Physical Therapy

## 2023-03-28 DIAGNOSIS — M6281 Muscle weakness (generalized): Secondary | ICD-10-CM | POA: Diagnosis present

## 2023-03-28 DIAGNOSIS — R296 Repeated falls: Secondary | ICD-10-CM | POA: Diagnosis present

## 2023-03-28 DIAGNOSIS — R2689 Other abnormalities of gait and mobility: Secondary | ICD-10-CM

## 2023-03-28 NOTE — Therapy (Signed)
OUTPATIENT PHYSICAL THERAPY TREATMENT   Patient Name: Francis Cardenas MRN: [id number] DOB:1951/04/17, 72 y.o., male Today's Date: 03/28/2023  END OF SESSION:  PT End of Session - 03/28/23 0759     Visit Number 6    Number of Visits 16    Date for PT Re-Evaluation 04/18/23    PT Start Time 0801    PT Stop Time 0840    PT Time Calculation (min) 39 min    Activity Tolerance Patient tolerated treatment well    Behavior During Therapy [facility] for tasks assessed/performed                Past Medical History:  Diagnosis Date   Alcohol dependence (HCC)    BPH (benign prostatic hyperplasia)    Cancer (HCC)    prostate   COPD (chronic obstructive pulmonary disease) (HCC)    Diabetes mellitus without complication (HCC)    Dyspnea on exertion 07/14/2022   Elevated troponin 04/06/2019   Fall at home, initial encounter 09/14/2022   Hypercholesteremia    Hypertension    OSA on CPAP    Past Surgical History:  Procedure Laterality Date   ACHILLES TENDON REPAIR Left    KNEE ARTHROSCOPY Left    RIGHT/LEFT HEART CATH AND CORONARY ANGIOGRAPHY N/A 04/09/2019   Procedure: RIGHT/LEFT HEART CATH AND CORONARY ANGIOGRAPHY;  Surgeon: [place], Peter M, MD;  Location: [facility];  Service: Cardiovascular;  Laterality: N/A;   TONSILLECTOMY     WRIST FRACTURE SURGERY Left    Patient Active Problem List   Diagnosis Date Noted   Acute exacerbation of CHF (congestive heart failure) (HCC) 12/25/2022   Acute on chronic diastolic CHF (congestive heart failure) (HCC) 12/24/2022   Chronic kidney disease, stage 3 (HCC) 12/24/2022   Anxiety state 12/24/2022   BPH (benign prostatic hyperplasia) 09/14/2022   Ambulatory dysfunction 09/14/2022   Acute renal failure superimposed on stage 3a chronic kidney disease (HCC) 06/15/2022   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   Chronic gout of multiple sites 08/03/2021   Pressure injury of skin 05/15/2021   Prostate cancer (HCC) 05/14/2021    Persistent atrial fibrillation (HCC) 05/14/2021   Morbid obesity (HCC) 12/12/2018   (HFpEF) heart failure with preserved ejection fraction (HCC) 12/10/2018   Multiple rib fractures 12/10/2018   OSA (obstructive sleep apnea) 12/10/2018   Chest pain syndrome 12/30/2017   Carcinoma of prostate (HCC) 08/02/2016   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   GERD (gastroesophageal reflux disease) 08/02/2016   Mixed hyperlipidemia 08/02/2016   Peripheral neuropathy 08/02/2016   Steatosis of liver 08/02/2016   Type 2 diabetes mellitus with obesity (HCC) 08/02/2016   Benign essential hypertension 06/11/2013   Chronic pain due to trauma 06/11/2013   Alcohol dependence (HCC) 03/26/2013   PTSD (post-traumatic stress disorder) 03/26/2013   Depressive disorder 03/24/2013    PCP: Benjiman Core  REFERRING PROVIDER: Lawana Pai- Salli Real  REFERRING DIAG:  Diagnosis  R53.1 (ICD-10-CM) - Weakness    THERAPY DIAG:  Other abnormalities of gait and mobility  Repeated falls  Muscle weakness (generalized)  Rationale for Evaluation and Treatment: Rehabilitation  ONSET DATE: Increasing falls over the past year   SUBJECTIVE:   SUBJECTIVE STATEMENT: Pt reports moving a little slow this morning. Still has some pain in legs.   PERTINENT HISTORY: COPD. DMII, CHF. Left achilles repair, Knee arthroscopy ( 1980) PAIN:  Are you having pain? Yes: NPRS scale: 7/10 Pain location: bilateral knees Pain description: aching  Aggravating factors: standing/ walking/ sit  to stand transfer Relieving factors: rest   PRECAUTIONS: Fall  RED FLAGS: None   WEIGHT BEARING RESTRICTIONS: No  FALLS:  Has patient fallen in last 6 months? Yes. Number of falls 7 falls this year   LIVING ENVIRONMENT: A few steps from the garage 6 steps up into the house from the porch. Patient can not do  OCCUPATION:  Retired    PLOF: Requires assistive device for independence  PATIENT GOALS:  To have less falls     NEXT MD VISIT:   October 24th    OBJECTIVE:  Note: Objective measures were completed at Evaluation unless otherwise noted.  DIAGNOSTIC FINDINGS:  CT of lumbar and thoraicc spine  CT LUMBAR SPINE FINDINGS   Segmentation: 5 lumbar type vertebrae   Alignment: No traumatic malalignment.   Vertebrae: No acute fracture or focal pathologic process.   Paraspinal and other soft tissues: Atheromatous calcification. Partial coverage of full urinary bladder.   Disc levels: Disc space narrowing and endplate ridging at L2-3 and below. Degenerative facet spurring at the same levels, greatest at L3-4 where there is compressive spinal stenosis.   IMPRESSION: No evidence of acute injury to the thoracic or lumbar spine.  PATIENT SURVEYS:  FOTO    COGNITION: Overall cognitive status: Within functional limits for tasks assessed     SENSATION: WFL  EDEMA:  Bilateral LE edema   MUSCLE LENGTH:   POSTURE: rounded shoulders, forward head, posterior pelvic tilt, and flexed trunk   PALPATION:    LOWER EXTREMITY MMT:  MMT Right eval Left eval Right 10/29 Left  10/29  Hip flexion 17.3 17.2 58.4 62.0  Hip extension      Hip abduction 36.7 36.5 50.3 53.8  Hip adduction      Hip internal rotation      Hip external rotation      Knee flexion      Knee extension 24.4 23.6 56.1 54.8  Ankle dorsiflexion      Ankle plantarflexion      Ankle inversion      Ankle eversion       (Blank rows = not tested)   FUNCTIONAL TESTS:  10/29: 5x sit to stand 11.58seconds   GAIT: Using rolling walker, limited distances. Patient became SOB walking more than 50'.  TODAY'S TREATMENT:                                                                                                                              DATE:   03/28/23 Nustep seat 11, Level 4, 5 minutes for dynamic warm up and conditioning Standing hip abduction 2 x 10 bilateral RTB at knees Standing alternating march on airex 2 x  10 bilateral with unilateral HHA NBOS on airex 3 x 30 second holds Sit to stand from chair with airex 2x10   03/23/23 Nustep seat 11, Level 4, 5 minutes for dynamic warm up and conditioning Standing HR/TR 2x15 Standing alternating march 2 x 10  bilateral with unilateral HHA Standing hip abduction 2 x 10 bilateral NBOS on airex 3 x 30 second holds Standing row RTB 2 x 15 Sit to stand from chair with airex 1x10  10/29  Nu-step: 5 min L4  Updated strength measures Updated 5xSTS  Standing HR/TR 2x15 Side stepping at rail with RTB at ankles  Standing march 10x10 Romberg with EC 3x30sec SBA Standing march- no Rail- SBA /light CGA x10  Sit to stand from chair with airex 2x10    10/22  Nu-step: 5 min L4   Standing HR/TR 2x10 Standing march 2x10 Romberg with EO x30sec; with EC 3x30sec SBA  Seated march 3x10  2# weights Seated LAQ 2# 2x10ea Sit to stand from chair with airex 2x10   Bilateral er 2x10 yellow  Bilateral horizontal abduction 2x10 yellow      PATIENT EDUCATION:  Education details: HEP, symptom management,  Person educated: Patient Education method: Explanation, Demonstration, Tactile cues, Verbal cues, and Handouts Education comprehension: verbalized understanding, returned demonstration, verbal cues required, tactile cues required, and needs further education  HOME EXERCISE PROGRAM: To be established.  Access Code: [email or web address] URL: [email or web address] Date: 03/03/2023 Prepared by: Lorayne Bender  Exercises - Seated March  - 1 x daily - 7 x weekly - 3 sets - 10 reps - Seated Hip Abduction with Resistance  - 1 x daily - 7 x weekly - 3 sets - 10 reps - Seated Heel Raise  - 1 x daily - 7 x weekly - 3 sets - 10 reps - Shoulder External Rotation and Scapular Retraction with Resistance  - 1 x daily - 7 x weekly - 3 sets - 10 reps - Low Horizontal Abduction with Resistance  - 1 x daily - 7 x weekly - 3 sets - 10 reps  ASSESSMENT:  CLINICAL  IMPRESSION: Continued with strengthening exercises which are tolerated well with progressions as able. Intermittent rest breaks for fatigue. Patient will continue to benefit from physical therapy in order to improve function and reduce impairment.   Eval:  Patient is a 72 year old male who presents to therapy with frequent falls and bilateral knee pain.  He feels like at times his legs just give out from under him.  For years he had 1-2 falls a year.  Over the most recent years had 7 falls.  Presents with baseline mobility deficits.  He uses a walker for primary mobility.  He has a long history of knee issues.  He has decree strength in all major leg muscle groups.  Patient reports he can only ambulate about 150 feet before he becomes fatigued.  He has a limited 5 times sit to stand test time puts him at a fall risk.  Will perform Berg testing next visit as he did not have time today.  Baseline balance testing showed increased difficulty with balance with eyes closed activities.  He would benefit from skilled therapy to decrease fall risk and improve ability to perform daily tasks safely OBJECTIVE IMPAIRMENTS: Abnormal gait, decreased activity tolerance, decreased endurance, decreased mobility, difficulty walking, decreased ROM, decreased strength, increased edema, postural dysfunction, obesity, and pain.   ACTIVITY LIMITATIONS: carrying, standing, squatting, stairs, transfers, and locomotion level  PARTICIPATION LIMITATIONS: meal prep, cleaning, laundry, community activity, occupation, and yard work  PERSONAL FACTORS: 3+ comorbidities: CHF that causes swelling in his legs; OA; Bilateral knee pain   are also affecting patient's functional outcome.   REHAB POTENTIAL: Good  CLINICAL DECISION MAKING: Evolving/moderate complexity  EVALUATION COMPLEXITY: Moderate  GOALS: Goals reviewed with patient? Yes  SHORT TERM GOALS: Target date: 03/21/2023   Therapy will assess Berg balance  score Baseline: Goal status: INITIAL  2.  Patient will demonstrate tandem stance with eyes closed with standby assist Baseline:  Goal status: IN PROGRESS (can compete with EO, required CGA with EC) 10/29  3.  Patient will increase gross bilateral lower extremity strength by 5 pounds Baseline:  Goal status: MET 10/29  4.  Patient will reduce 5 times sit to stand test by 5 seconds Baseline:  Goal status: MET 10/29   LONG TERM GOALS: Target date: 04/18/2023    Patient will ambulate 300 feet without increased knee pain without falling and without increased dyspnea  Baseline:  Goal status: In PROGRESS (277ft today 10/29)  2.  Patient will go up and down 6 steps without difficulty in order to go up his porch Baseline:  Goal status: INITIAL  3.  Patient will have complete exercise program Baseline:  Goal status: INITIAL    PLAN:  PT FREQUENCY: 2x/week  PT DURATION: 8 weeks  PLANNED INTERVENTIONS: Therapeutic exercises, Therapeutic activity, Neuromuscular re-education, Balance training, Gait training, Patient/Family education, Self Care, Joint mobilization, Stair training, DME instructions, Aquatic Therapy, Dry Needling, Electrical stimulation, Cryotherapy, Moist heat, Taping, Manual therapy, and Re-evaluation.   PLAN FOR NEXT SESSION: Consider endurance exercises such as NuStep or exercise bike.  Monitor dyspnea with cardiovascular exercises.  Consider seated lower extremity or upper extremity series.  Consider Berg balance test.   Consider sit to stand transfers.  Consider higher level balance activity as tolerated including Airex, hurdles, and coordination exercises.   Reola Mosher Allannah Kempen, PT 03/28/2023, 8:39 AM

## 2023-03-30 ENCOUNTER — Ambulatory Visit (HOSPITAL_BASED_OUTPATIENT_CLINIC_OR_DEPARTMENT_OTHER): Payer: No Typology Code available for payment source | Admitting: Physical Therapy

## 2023-03-30 DIAGNOSIS — R2689 Other abnormalities of gait and mobility: Secondary | ICD-10-CM

## 2023-03-30 DIAGNOSIS — M6281 Muscle weakness (generalized): Secondary | ICD-10-CM

## 2023-03-30 DIAGNOSIS — R296 Repeated falls: Secondary | ICD-10-CM

## 2023-03-30 NOTE — Therapy (Addendum)
 OUTPATIENT PHYSICAL THERAPY TREATMENT  PHYSICAL THERAPY DISCHARGE SUMMARY  Visits from Start of Care: 7  Current functional level related to goals / functional outcomes: unknown   Remaining deficits: pain   Education / Equipment: Management of condition/ HEP   Patient agrees to discharge. Patient goals were partially met. Patient is being discharged due to not returning since the last visit.  Addend Corrie Dandy [facility]) [place] MPT 07/27/23 10:51 AM [facility] [address] [place], [place], [address] Phone: [phone]   Fax:  [phone]   Patient Name: Francis Cardenas MRN: [id number] DOB:[birth year], 72 y.o., male Today's Date: 03/30/2023  END OF SESSION:  PT End of Session - 03/30/23 0758     Visit Number 7    Number of Visits 16    Date for PT Re-Evaluation 04/18/23                Past Medical History:  Diagnosis Date   Alcohol dependence (HCC)    BPH (benign prostatic hyperplasia)    Cancer (HCC)    prostate   COPD (chronic obstructive pulmonary disease) (HCC)    Diabetes mellitus without complication (HCC)    Dyspnea on exertion 07/14/2022   Elevated troponin 04/06/2019   Fall at home, initial encounter 09/14/2022   Hypercholesteremia    Hypertension    OSA on CPAP    Past Surgical History:  Procedure Laterality Date   ACHILLES TENDON REPAIR Left    KNEE ARTHROSCOPY Left    RIGHT/LEFT HEART CATH AND CORONARY ANGIOGRAPHY N/A 04/09/2019   Procedure: RIGHT/LEFT HEART CATH AND CORONARY ANGIOGRAPHY;  Surgeon: [place], Peter M, MD;  Location: [facility] INVASIVE CV LAB;  Service: Cardiovascular;  Laterality: N/A;   TONSILLECTOMY     WRIST FRACTURE SURGERY Left    Patient Active Problem List   Diagnosis Date Noted   Acute exacerbation of CHF (congestive heart failure) (HCC) 12/25/2022   Acute on chronic diastolic CHF (congestive heart failure) (HCC) 12/24/2022   Chronic kidney disease, stage 3 (HCC)  12/24/2022   Anxiety state 12/24/2022   BPH (benign prostatic hyperplasia) 09/14/2022   Ambulatory dysfunction 09/14/2022   Acute renal failure superimposed on stage 3a chronic kidney disease (HCC) 06/15/2022   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   Chronic gout of multiple sites 08/03/2021   Pressure injury of skin 05/15/2021   Prostate cancer (HCC) 05/14/2021   Persistent atrial fibrillation (HCC) 05/14/2021   Morbid obesity (HCC) 12/12/2018   (HFpEF) heart failure with preserved ejection fraction (HCC) 12/10/2018   Multiple rib fractures 12/10/2018   OSA (obstructive sleep apnea) 12/10/2018   Chest pain syndrome 12/30/2017   Carcinoma of prostate (HCC) 08/02/2016   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   GERD (gastroesophageal reflux disease) 08/02/2016   Mixed hyperlipidemia 08/02/2016   Peripheral neuropathy 08/02/2016   Steatosis of liver 08/02/2016   Type 2 diabetes mellitus with obesity (HCC) 08/02/2016   Benign essential hypertension 06/11/2013   Chronic pain due to trauma 06/11/2013   Alcohol dependence (HCC) 03/26/2013   PTSD (post-traumatic stress disorder) 03/26/2013   Depressive disorder 03/24/2013    PCP: Benjiman Core  REFERRING PROVIDER: Lawana Pai- Salli Real  REFERRING DIAG:  Diagnosis  R53.1 (ICD-10-CM) - Weakness    THERAPY DIAG:  No diagnosis found.  Rationale for Evaluation and Treatment: Rehabilitation  ONSET DATE: Increasing falls over the past year   SUBJECTIVE:   SUBJECTIVE STATEMENT: The patient reports he is doing pretty well. He hasgood days and bad days.  PERTINENT HISTORY: COPD. DMII, CHF. Left achilles repair, Knee arthroscopy ( 1980) PAIN:  Are you having pain? Yes: NPRS scale: 7/10 Pain location: bilateral knees Pain description: aching  Aggravating factors: standing/ walking/ sit to stand transfer Relieving factors: rest   PRECAUTIONS: Fall  RED FLAGS: None   WEIGHT BEARING RESTRICTIONS: No  FALLS:  Has  patient fallen in last 6 months? Yes. Number of falls 7 falls this year   LIVING ENVIRONMENT: A few steps from the garage 6 steps up into the house from the porch. Patient can not do  OCCUPATION:  Retired    PLOF: Requires assistive device for independence  PATIENT GOALS:  To have less falls    NEXT MD VISIT:   October 24th    OBJECTIVE:  Note: Objective measures were completed at Evaluation unless otherwise noted.  DIAGNOSTIC FINDINGS:  CT of lumbar and thoraicc spine  CT LUMBAR SPINE FINDINGS   Segmentation: 5 lumbar type vertebrae   Alignment: No traumatic malalignment.   Vertebrae: No acute fracture or focal pathologic process.   Paraspinal and other soft tissues: Atheromatous calcification. Partial coverage of full urinary bladder.   Disc levels: Disc space narrowing and endplate ridging at L2-3 and below. Degenerative facet spurring at the same levels, greatest at L3-4 where there is compressive spinal stenosis.   IMPRESSION: No evidence of acute injury to the thoracic or lumbar spine.  PATIENT SURVEYS:  FOTO    COGNITION: Overall cognitive status: Within functional limits for tasks assessed     SENSATION: WFL  EDEMA:  Bilateral LE edema   MUSCLE LENGTH:   POSTURE: rounded shoulders, forward head, posterior pelvic tilt, and flexed trunk   PALPATION:    LOWER EXTREMITY MMT:  MMT Right eval Left eval Right 10/29 Left  10/29  Hip flexion 17.3 17.2 58.4 62.0  Hip extension      Hip abduction 36.7 36.5 50.3 53.8  Hip adduction      Hip internal rotation      Hip external rotation      Knee flexion      Knee extension 24.4 23.6 56.1 54.8  Ankle dorsiflexion      Ankle plantarflexion      Ankle inversion      Ankle eversion       (Blank rows = not tested)   FUNCTIONAL TESTS:  10/29: 5x sit to stand 11.58seconds   GAIT: Using rolling walker, limited distances. Patient became SOB walking more than 50'.  TODAY'S TREATMENT:                                                                                                                               DATE:   11/7 Sit to stand from chair with airex 2x10 Nustep seat 11, Level 4, 5 minutes for dynamic warm up and conditioning Standing hip abduction 2 x 10 bilateral RTB at knees Standing alternating march on airex 2 x 10 bilateral with  unilateral HHA  Step ups 2 inch 2x10 fwd  Lateral step ups 2x10 sid eto side   Air-ex heel/toe x20    03/28/23 Nustep seat 11, Level 4, 5 minutes for dynamic warm up and conditioning Standing hip abduction 2 x 10 bilateral RTB at knees Standing alternating march on airex 2 x 10 bilateral with unilateral HHA NBOS on airex 3 x 30 second holds Sit to stand from chair with airex 2x10   03/23/23 Nustep seat 11, Level 4, 5 minutes for dynamic warm up and conditioning Standing HR/TR 2x15 Standing alternating march 2 x 10 bilateral with unilateral HHA Standing hip abduction 2 x 10 bilateral NBOS on airex 3 x 30 second holds Standing row RTB 2 x 15 Sit to stand from chair with airex 1x10  10/29  Nu-step: 5 min L4  Updated strength measures Updated 5xSTS  Standing HR/TR 2x15 Side stepping at rail with RTB at ankles  Standing march 10x10 Romberg with EC 3x30sec SBA Standing march- no Rail- SBA /light CGA x10  Sit to stand from chair with airex 2x10    10/22  Nu-step: 5 min L4   Standing HR/TR 2x10 Standing march 2x10 Romberg with EO x30sec; with EC 3x30sec SBA  Seated march 3x10  2# weights Seated LAQ 2# 2x10ea Sit to stand from chair with airex 2x10   Bilateral er 2x10 yellow  Bilateral horizontal abduction 2x10 yellow      PATIENT EDUCATION:  Education details: HEP, symptom management,  Person educated: Patient Education method: Explanation, Demonstration, Tactile cues, Verbal cues, and Handouts Education comprehension: verbalized understanding, returned demonstration, verbal cues required, tactile cues  required, and needs further education  HOME EXERCISE PROGRAM: To be established.  Access Code: [email or web address] URL: [email or web address] Date: 03/03/2023 Prepared by: Lorayne Bender  Exercises - Seated March  - 1 x daily - 7 x weekly - 3 sets - 10 reps - Seated Hip Abduction with Resistance  - 1 x daily - 7 x weekly - 3 sets - 10 reps - Seated Heel Raise  - 1 x daily - 7 x weekly - 3 sets - 10 reps - Shoulder External Rotation and Scapular Retraction with Resistance  - 1 x daily - 7 x weekly - 3 sets - 10 reps - Low Horizontal Abduction with Resistance  - 1 x daily - 7 x weekly - 3 sets - 10 reps  ASSESSMENT:  CLINICAL IMPRESSION: The patient reports minor fatigue and dyspnea with exercises but he is otherwise doing well. He reported almost falling the other day but caught himself. We continue to work on instability exercises and general strengthening exercises. He feels like LAQ is becoming easier.   Eval:  Patient is a 72 year old male who presents to therapy with frequent falls and bilateral knee pain.  He feels like at times his legs just give out from under him.  For years he had 1-2 falls a year.  Over the most recent years had 7 falls.  Presents with baseline mobility deficits.  He uses a walker for primary mobility.  He has a long history of knee issues.  He has decree strength in all major leg muscle groups.  Patient reports he can only ambulate about 150 feet before he becomes fatigued.  He has a limited 5 times sit to stand test time puts him at a fall risk.  Will perform Berg testing next visit as he did not have time today.  Baseline balance testing showed increased difficulty with balance with  eyes closed activities.  He would benefit from skilled therapy to decrease fall risk and improve ability to perform daily tasks safely OBJECTIVE IMPAIRMENTS: Abnormal gait, decreased activity tolerance, decreased endurance, decreased mobility, difficulty walking, decreased ROM,  decreased strength, increased edema, postural dysfunction, obesity, and pain.   ACTIVITY LIMITATIONS: carrying, standing, squatting, stairs, transfers, and locomotion level  PARTICIPATION LIMITATIONS: meal prep, cleaning, laundry, community activity, occupation, and yard work  PERSONAL FACTORS: 3+ comorbidities: CHF that causes swelling in his legs; OA; Bilateral knee pain   are also affecting patient's functional outcome.   REHAB POTENTIAL: Good  CLINICAL DECISION MAKING: Evolving/moderate complexity  EVALUATION COMPLEXITY: Moderate   GOALS: Goals reviewed with patient? Yes  SHORT TERM GOALS: Target date: 03/21/2023   Therapy will assess Berg balance score Baseline: Goal status: INITIAL  2.  Patient will demonstrate tandem stance with eyes closed with standby assist Baseline:  Goal status: IN PROGRESS (can compete with EO, required CGA with EC) 10/29  3.  Patient will increase gross bilateral lower extremity strength by 5 pounds Baseline:  Goal status: MET 10/29  4.  Patient will reduce 5 times sit to stand test by 5 seconds Baseline:  Goal status: MET 10/29   LONG TERM GOALS: Target date: 04/18/2023    Patient will ambulate 300 feet without increased knee pain without falling and without increased dyspnea  Baseline:  Goal status: In PROGRESS (29ft today 10/29)  2.  Patient will go up and down 6 steps without difficulty in order to go up his porch Baseline:  Goal status: INITIAL  3.  Patient will have complete exercise program Baseline:  Goal status: INITIAL    PLAN:  PT FREQUENCY: 2x/week  PT DURATION: 8 weeks  PLANNED INTERVENTIONS: Therapeutic exercises, Therapeutic activity, Neuromuscular re-education, Balance training, Gait training, Patient/Family education, Self Care, Joint mobilization, Stair training, DME instructions, Aquatic Therapy, Dry Needling, Electrical stimulation, Cryotherapy, Moist heat, Taping, Manual therapy, and Re-evaluation.    PLAN FOR NEXT SESSION: Consider endurance exercises such as NuStep or exercise bike.  Monitor dyspnea with cardiovascular exercises.  Consider seated lower extremity or upper extremity series.  Consider Berg balance test.   Consider sit to stand transfers.  Consider higher level balance activity as tolerated including Airex, hurdles, and coordination exercises.   Dessie Coma, PT 03/30/2023, 7:59 AM

## 2023-04-04 ENCOUNTER — Encounter (HOSPITAL_BASED_OUTPATIENT_CLINIC_OR_DEPARTMENT_OTHER): Payer: Self-pay

## 2023-04-04 ENCOUNTER — Ambulatory Visit (HOSPITAL_BASED_OUTPATIENT_CLINIC_OR_DEPARTMENT_OTHER): Payer: No Typology Code available for payment source | Admitting: Physical Therapy

## 2023-04-06 ENCOUNTER — Encounter (HOSPITAL_BASED_OUTPATIENT_CLINIC_OR_DEPARTMENT_OTHER): Payer: Self-pay

## 2023-04-06 ENCOUNTER — Encounter (HOSPITAL_BASED_OUTPATIENT_CLINIC_OR_DEPARTMENT_OTHER): Payer: No Typology Code available for payment source | Admitting: Physical Therapy

## 2023-04-26 ENCOUNTER — Encounter (HOSPITAL_BASED_OUTPATIENT_CLINIC_OR_DEPARTMENT_OTHER): Payer: No Typology Code available for payment source | Admitting: Physical Therapy

## 2023-04-29 ENCOUNTER — Encounter (HOSPITAL_BASED_OUTPATIENT_CLINIC_OR_DEPARTMENT_OTHER): Payer: No Typology Code available for payment source | Admitting: Physical Therapy

## 2023-05-01 ENCOUNTER — Emergency Department (HOSPITAL_COMMUNITY): Payer: No Typology Code available for payment source

## 2023-05-01 ENCOUNTER — Encounter (HOSPITAL_COMMUNITY): Payer: Self-pay

## 2023-05-01 ENCOUNTER — Other Ambulatory Visit: Payer: Self-pay

## 2023-05-01 ENCOUNTER — Emergency Department (HOSPITAL_COMMUNITY)
Admission: EM | Admit: 2023-05-01 | Discharge: 2023-05-01 | Disposition: A | Discharge status: Home / Self Care | Payer: No Typology Code available for payment source | Attending: Emergency Medicine | Admitting: Emergency Medicine

## 2023-05-01 DIAGNOSIS — F1090 Alcohol use, unspecified, uncomplicated: Secondary | ICD-10-CM | POA: Insufficient documentation

## 2023-05-01 DIAGNOSIS — M25551 Pain in right hip: Secondary | ICD-10-CM | POA: Diagnosis not present

## 2023-05-01 DIAGNOSIS — M79621 Pain in right upper arm: Secondary | ICD-10-CM | POA: Diagnosis present

## 2023-05-01 DIAGNOSIS — W182XXA Fall in (into) shower or empty bathtub, initial encounter: Secondary | ICD-10-CM | POA: Insufficient documentation

## 2023-05-01 DIAGNOSIS — S0990XA Unspecified injury of head, initial encounter: Secondary | ICD-10-CM | POA: Insufficient documentation

## 2023-05-01 DIAGNOSIS — S41111A Laceration without foreign body of right upper arm, initial encounter: Secondary | ICD-10-CM | POA: Insufficient documentation

## 2023-05-01 DIAGNOSIS — Z23 Encounter for immunization: Secondary | ICD-10-CM | POA: Diagnosis not present

## 2023-05-01 DIAGNOSIS — W19XXXA Unspecified fall, initial encounter: Secondary | ICD-10-CM

## 2023-05-01 DIAGNOSIS — J449 Chronic obstructive pulmonary disease, unspecified: Secondary | ICD-10-CM | POA: Diagnosis not present

## 2023-05-01 DIAGNOSIS — Z7901 Long term (current) use of anticoagulants: Secondary | ICD-10-CM | POA: Insufficient documentation

## 2023-05-01 DIAGNOSIS — S93401A Sprain of unspecified ligament of right ankle, initial encounter: Secondary | ICD-10-CM | POA: Insufficient documentation

## 2023-05-01 DIAGNOSIS — Z789 Other specified health status: Secondary | ICD-10-CM

## 2023-05-01 DIAGNOSIS — E119 Type 2 diabetes mellitus without complications: Secondary | ICD-10-CM | POA: Insufficient documentation

## 2023-05-01 DIAGNOSIS — M25561 Pain in right knee: Secondary | ICD-10-CM | POA: Insufficient documentation

## 2023-05-01 DIAGNOSIS — Y92002 Bathroom of unspecified non-institutional (private) residence single-family (private) house as the place of occurrence of the external cause: Secondary | ICD-10-CM | POA: Insufficient documentation

## 2023-05-01 LAB — SAMPLE TO BLOOD BANK

## 2023-05-01 LAB — URINALYSIS, ROUTINE W REFLEX MICROSCOPIC
Bilirubin Urine: NEGATIVE
Glucose, UA: 50 mg/dL — AB
Hgb urine dipstick: NEGATIVE
Ketones, ur: NEGATIVE mg/dL
Leukocytes,Ua: NEGATIVE
Nitrite: NEGATIVE
Protein, ur: NEGATIVE mg/dL
Specific Gravity, Urine: 1.005 (ref 1.005–1.030)
pH: 5 (ref 5.0–8.0)

## 2023-05-01 LAB — COMPREHENSIVE METABOLIC PANEL
ALT: 16 U/L (ref 0–44)
AST: 29 U/L (ref 15–41)
Albumin: 4 g/dL (ref 3.5–5.0)
Alkaline Phosphatase: 96 U/L (ref 38–126)
Anion gap: 16 — ABNORMAL HIGH (ref 5–15)
BUN: 37 mg/dL — ABNORMAL HIGH (ref 8–23)
CO2: 19 mmol/L — ABNORMAL LOW (ref 22–32)
Calcium: 10.6 mg/dL — ABNORMAL HIGH (ref 8.9–10.3)
Chloride: 95 mmol/L — ABNORMAL LOW (ref 98–111)
Creatinine, Ser: 1.86 mg/dL — ABNORMAL HIGH (ref 0.61–1.24)
GFR, Estimated: 38 mL/min — ABNORMAL LOW (ref >60)
Glucose, Bld: 106 mg/dL — ABNORMAL HIGH (ref 70–99)
Potassium: 4.7 mmol/L (ref 3.5–5.1)
Sodium: 130 mmol/L — ABNORMAL LOW (ref 135–145)
Total Bilirubin: 1.1 mg/dL (ref <1.2)
Total Protein: 6.6 g/dL (ref 6.5–8.1)

## 2023-05-01 LAB — RAPID URINE DRUG SCREEN, HOSP PERFORMED
Amphetamines: NOT DETECTED
Barbiturates: NOT DETECTED
Benzodiazepines: POSITIVE — AB
Cocaine: NOT DETECTED
Opiates: NOT DETECTED
Tetrahydrocannabinol: NOT DETECTED

## 2023-05-01 LAB — I-STAT CHEM 8, ED
BUN: 49 mg/dL — ABNORMAL HIGH (ref 8–23)
Calcium, Ion: 1.3 mmol/L (ref 1.15–1.40)
Chloride: 97 mmol/L — ABNORMAL LOW (ref 98–111)
Creatinine, Ser: 2.2 mg/dL — ABNORMAL HIGH (ref 0.61–1.24)
Glucose, Bld: 107 mg/dL — ABNORMAL HIGH (ref 70–99)
HCT: 39 % (ref 39.0–52.0)
Hemoglobin: 13.3 g/dL (ref 13.0–17.0)
Potassium: 4.7 mmol/L (ref 3.5–5.1)
Sodium: 129 mmol/L — ABNORMAL LOW (ref 135–145)
TCO2: 22 mmol/L (ref 22–32)

## 2023-05-01 LAB — CBC
HCT: 37.8 % — ABNORMAL LOW (ref 39.0–52.0)
Hemoglobin: 12.3 g/dL — ABNORMAL LOW (ref 13.0–17.0)
MCH: 30.4 pg (ref 26.0–34.0)
MCHC: 32.5 g/dL (ref 30.0–36.0)
MCV: 93.3 fL (ref 80.0–100.0)
Platelets: 203 10*3/uL (ref 150–400)
RBC: 4.05 MIL/uL — ABNORMAL LOW (ref 4.22–5.81)
RDW: 15.9 % — ABNORMAL HIGH (ref 11.5–15.5)
WBC: 7.9 10*3/uL (ref 4.0–10.5)
nRBC: 0 % (ref 0.0–0.2)

## 2023-05-01 LAB — PROTIME-INR
INR: 1.1 (ref 0.8–1.2)
Prothrombin Time: 14.8 s (ref 11.4–15.2)

## 2023-05-01 LAB — I-STAT CG4 LACTIC ACID, ED: Lactic Acid, Venous: 2.6 mmol/L (ref 0.5–1.9)

## 2023-05-01 LAB — ETHANOL: Alcohol, Ethyl (B): 105 mg/dL — ABNORMAL HIGH (ref <10)

## 2023-05-01 MED ORDER — ACETAMINOPHEN 500 MG PO TABS
1000.0000 mg | ORAL_TABLET | ORAL | Status: AC
  Administered 2023-05-01: 1000 mg via ORAL
  Filled 2023-05-01: qty 2

## 2023-05-01 MED ORDER — SODIUM CHLORIDE 0.9 % IV BOLUS: 1000.0000 mL | Freq: Once | INTRAVENOUS | Status: DC

## 2023-05-01 MED ORDER — TETANUS-DIPHTH-ACELL PERTUSSIS 5-2.5-18.5 LF-MCG/0.5 IM SUSY
0.5000 mL | PREFILLED_SYRINGE | Freq: Once | INTRAMUSCULAR | Status: AC
  Administered 2023-05-01: 0.5 mL via INTRAMUSCULAR
  Filled 2023-05-01: qty 0.5

## 2023-05-01 MED ORDER — SODIUM CHLORIDE 0.9 % IV BOLUS
500.0000 mL | Freq: Once | INTRAVENOUS | Status: AC
  Administered 2023-05-01: 500 mL via INTRAVENOUS

## 2023-05-01 MED ORDER — SODIUM CHLORIDE 0.9 % IV BOLUS: 500.0000 mL | Freq: Once | INTRAVENOUS | Status: DC

## 2023-05-01 NOTE — ED Notes (Signed)
Pt ambulatory to restroom with walker, unassisted with steady gait.

## 2023-05-01 NOTE — Progress Notes (Signed)
Orthopedic Tech Progress Note Patient Details:  AMI KUMAGAI [birth year] [id number] Applied air cast per order.  Ortho Devices Type of Ortho Device: Ankle Air splint Ortho Device/Splint Location: RLE Ortho Device/Splint Interventions: Ordered, Application, Adjustment   Post Interventions Patient Tolerated: Well Instructions Provided: Adjustment of device, Care of device  Blase Mess 05/01/2023, 6:25 PM

## 2023-05-01 NOTE — Discharge Instructions (Signed)
You were seen for your fall and ankle sprain in the emergency department.   At home, please use the Aircast we have given you.  You may weight-bear as tolerated.  Use Tylenol and ibuprofen for your pain.  Please elevate your ankle and use ice to limit the swelling.  Check your MyChart online for the results of any tests that had not resulted by the time you left the emergency department.   Please follow-up with sports medicine in 1 to 2 weeks.  Return immediately to the emergency department if you experience any concerning symptoms.  Thank you for visiting our Emergency Department. It was a pleasure taking care of you today.

## 2023-05-01 NOTE — ED Triage Notes (Addendum)
Pt BIB [facility] after falling in his bathroom into his bathtub. Per EMS pt has had 12 beers and appears sluggish. No LOC or obvious head injury but pt does report hitting head. Reports pain on top of head and R knee. Pt takes Eliquis for hx a-fib. Denies neck or back pain. VSS per EMS: BP 112/60 HR 60.

## 2023-05-01 NOTE — ED Notes (Signed)
Critical Lactic Acid called to Dr. Jarold Motto.-LF

## 2023-05-01 NOTE — ED Provider Notes (Signed)
[place] EMERGENCY DEPARTMENT AT [facility] Provider Note   CSN: [id number] Arrival date & time: 05/01/23  1501     History {Add pertinent medical, surgical, social history, OB history to HPI:1} Chief Complaint  Patient presents with   Francis Cardenas is a 72 y.o. male.  72 year old male with a history of obesity, alcohol dependence, COPD, diabetes, and atrial fibrillation on Eliquis who presents emergency department after a fall.  Patient reports that this morning he was at home and went to go to the bathroom when he lost his balance and fell.  Did hit his head.  Did not blackout.  No neck pain.  Says that he fell onto his right side and is having some right upper extremity and right knee and ankle pain.  Last took his Eliquis at 4 AM this morning.  Does drink around 9 beers per day and has been drinking today.  Small skin tear to right upper extremity with gauze placed by EMS.       Home Medications Prior to Admission medications   Medication Sig Start Date End Date Taking? Authorizing Provider  acetaminophen (TYLENOL) 325 MG tablet Take 2 tablets (650 mg total) by mouth every 6 (six) hours as needed. 09/27/21   Sloan Leiter, DO  albuterol (PROVENTIL HFA;VENTOLIN HFA) 108 (90 BASE) MCG/ACT inhaler Inhale 2 puffs into the lungs every 6 (six) hours as needed for wheezing.    [provider]  allopurinol (ZYLOPRIM) 100 MG tablet Take 100 mg by mouth daily. 05/07/22   [provider]  ALPRAZolam Prudy Feeler) 1 MG tablet Take 1 tablet (1 mg total) by mouth at bedtime. 12/30/22   Zannie Cove, MD  amLODipine (NORVASC) 10 MG tablet Take 10 mg by mouth daily. 11/25/22   [provider]  apixaban (ELIQUIS) 5 MG TABS tablet Take 1 tablet (5 mg total) by mouth 2 (two) times daily. 02/28/22   Jodelle Red, MD  atorvastatin (LIPITOR) 80 MG tablet Take 40 mg by mouth daily with supper.    [provider]  carvedilol (COREG) 6.25 MG  tablet Take 6.25 mg by mouth 2 (two) times daily with a meal. 04/08/22   [provider]  empagliflozin (JARDIANCE) 25 MG TABS tablet Take 1 tablet (25 mg total) by mouth daily. 01/01/23   Amin, Ankit C, MD  fluticasone-salmeterol (WIXELA INHUB) 250-50 MCG/ACT AEPB Inhale 1 puff into the lungs in the morning and at bedtime. Patient taking differently: Inhale 1 puff into the lungs daily. 09/05/22   Luciano Cutter, MD  gabapentin (NEURONTIN) 300 MG capsule Take 1 capsule (300 mg total) by mouth 2 (two) times daily. Patient taking differently: Take 300 mg by mouth 3 (three) times daily. 01/01/23   Amin, Ankit C, MD  guaifenesin (HUMIBID E) 400 MG TABS tablet Take 400 mg by mouth See admin instructions.  TAKE ONE TABLET BY MOUTH TWICE A DAY AS NEEDED FOR COUGH, CONGESTION FOR COUGH AND CONGESTION. FOR COUGH, CONGESTION 11/22/22   [provider]  lisinopril (ZESTRIL) 40 MG tablet Take 40 mg by mouth See admin instructions.  TAKE ONE TABLET BY MOUTH DAILY . HOLD DOSE FOR SYSTOLIC BLOOD PRESSURE LESS THAN 100. AVOID POTASSIUM SUPPLEMENTS OR ARTIFICIAL SALT. CAN INTERACT WITH SPIRONOLACTONE, SO WE WILL CHECK YOUR POTASSIUM LEVEL PERIODICALLY. 07/12/22   [provider]  Magnesium Oxide 420 MG TABS Take 420 mg by mouth daily with supper.    [provider]  metoprolol succinate (  TOPROL-XL) 25 MG 24 hr tablet Take 0.5 tablets (12.5 mg total) by mouth daily. Patient not taking: Reported on 01/13/2023 09/04/21   Lonia Blood, MD  Multiple Vitamins-Minerals (MULTIVITAMIN WITH MINERALS) tablet Take 1 tablet by mouth every Monday, Wednesday, and Friday.    [provider]  omeprazole (PRILOSEC) 20 MG capsule Take 20 mg by mouth daily with supper.    [provider]  potassium chloride SA (KLOR-CON M) 20 MEQ tablet Take 1 tablet (20 mEq total) by mouth daily with supper. 05/20/21   Lorin Glass, MD  spironolactone (ALDACTONE) 25 MG tablet Take 25 mg by mouth  every morning.    [provider]  thiamine (VITAMIN B-1) 100 MG tablet Take 100 mg by mouth daily.    [provider]  Tiotropium Bromide Monohydrate (SPIRIVA RESPIMAT) 2.5 MCG/ACT AERS Inhale 2 puffs into the lungs daily. 09/05/22   Luciano Cutter, MD  torsemide (DEMADEX) 20 MG tablet Take 20 mg by mouth 2 (two) times daily. 10/21/21   [provider]  vitamin B-12 (CYANOCOBALAMIN) 500 MCG tablet Take 500 mcg by mouth daily with supper.    [provider]      Allergies    Oxcarbazepine and Zolpidem    Review of Systems   Review of Systems  Physical Exam Updated Vital Signs BP 115/60 (BP Location: Left Arm)   Pulse 63   Temp 98 F (36.7 C) (Oral)   Resp 16   SpO2 95%  Physical Exam Constitutional:      General: He is not in acute distress.    Appearance: Normal appearance. He is not ill-appearing.  HENT:     Head: Normocephalic and atraumatic.     Right Ear: External ear normal.     Left Ear: External ear normal.     Mouth/Throat:     Mouth: Mucous membranes are moist.     Pharynx: Oropharynx is clear.  Eyes:     Extraocular Movements: Extraocular movements intact.     Conjunctiva/sclera: Conjunctivae normal.     Pupils: Pupils are equal, round, and reactive to light.     Comments: Pulse 3 mm bilaterally  Neck:     Comments: No C-spine midline tenderness to palpation Cardiovascular:     Rate and Rhythm: Normal rate. Rhythm irregular.     Pulses: Normal pulses.     Heart sounds: Normal heart sounds.  Pulmonary:     Effort: Pulmonary effort is normal. No respiratory distress.     Breath sounds: Normal breath sounds.  Abdominal:     General: There is no distension.     Palpations: Abdomen is soft.     Tenderness: There is no abdominal tenderness. There is no guarding.  Genitourinary:    Comments: Good rectal tone Musculoskeletal:        General: No deformity. Normal range of motion.     Cervical back: No rigidity or tenderness.      Comments: No tenderness to palpation of midline thoracic or lumbar spine.  No step-offs palpated.  No tenderness to palpation of chest wall.  No bruising noted.  No tenderness to palpation of bilateral clavicles.  No tenderness to palpation, bruising, or deformities noted of bilateral shoulders, elbows, or wrists.  Small laceration to right upper arm with bleeding controlled.  Tenderness to palpation of right hip, knee, and ankle.  Neurological:     General: No focal deficit present.     Mental Status: He is alert and oriented  to person, place, and time. Mental status is at baseline.     Cranial Nerves: No cranial nerve deficit.     Sensory: No sensory deficit.     Motor: No weakness.     ED Results / Procedures / Treatments   Labs (all labs ordered are listed, but only abnormal results are displayed) Labs Reviewed  COMPREHENSIVE METABOLIC PANEL  CBC  ETHANOL  URINALYSIS, ROUTINE W REFLEX MICROSCOPIC  PROTIME-INR  RAPID URINE DRUG SCREEN, HOSP PERFORMED  I-STAT CHEM 8, ED  I-STAT CG4 LACTIC ACID, ED  SAMPLE TO BLOOD BANK    EKG None  Radiology No results found.  Procedures Procedures  {Document cardiac monitor, telemetry assessment procedure when appropriate:1}  Medications Ordered in ED Medications  Tdap (BOOSTRIX) injection 0.5 mL (has no administration in time range)    ED Course/ Medical Decision Making/ A&P   {   Click here for ABCD2, HEART and other calculatorsREFRESH Note before signing :1}                              Medical Decision Making Amount and/or Complexity of Data Reviewed Labs: ordered. Radiology: ordered.  Risk Prescription drug management.   ***  {Document critical care time when appropriate:1} {Document review of labs and clinical decision tools ie heart score, Chads2Vasc2 etc:1}  {Document your independent review of radiology images, and any outside records:1} {Document your discussion with family members, caretakers, and with  consultants:1} {Document social determinants of health affecting pt's care:1} {Document your decision making why or why not admission, treatments were needed:1} Final Clinical Impression(s) / ED Diagnoses Final diagnoses:  None    Rx / DC Orders ED Discharge Orders     None

## 2023-05-01 NOTE — ED Notes (Signed)
Patient to CT on continuous cardiac and pulse oximetry monitoring and q70min BP monitoring with RN, Huntley Dec.

## 2023-05-02 ENCOUNTER — Encounter (HOSPITAL_BASED_OUTPATIENT_CLINIC_OR_DEPARTMENT_OTHER): Payer: No Typology Code available for payment source | Admitting: Physical Therapy

## 2023-05-05 ENCOUNTER — Encounter (HOSPITAL_BASED_OUTPATIENT_CLINIC_OR_DEPARTMENT_OTHER): Payer: No Typology Code available for payment source | Admitting: Physical Therapy

## 2023-06-21 NOTE — Progress Notes (Signed)
Office Visit Note  Patient: Francis Cardenas             Date of Birth: 1950-12-19           MRN: [id number]             PCP: Benjiman Core, MD Referring: Benjiman Core* Visit Date: 07/05/2023 Occupation: @GUAROCC @  Subjective:  Pain in multiple joints  History of Present Illness: Francis Cardenas is a 73 y.o. male seen in consultation per request of his PCP for the evaluation of polyarthralgia and gout.  Patient states about 3 years ago he started having more swelling on his legs.  Since then he has been on diuretics.  He states he cannot wear compression socks as he has having difficulty putting on his pants.  He started having swelling on his ankles and knees.  He was diagnosed with gout.  He states he was initially given colchicine but continued to have recurrent flares.  Then he was started on allopurinol 300 mg a day.  Later that allopurinol was increased to 400 mg a day.  He has also been on meloxicam 15 mg p.o. daily due to ongoing pain and discomfort in his joints.  He states he has been taking meloxicam for the last 1-1/2 years.  He notices some swelling in his right wrist which she relates to previous injury.  He has some discomfort in his left wrist but no swelling.  He has chronic discomfort in his neck due to underlying disc disease and chronic discomfort in his back due to underlying disc disease.  Patient states he is following with [facility].  He has discomfort in his knee joints and has been told that he has end-stage osteoarthritis per patient.  He had injury to his right ankle and was told that he had ankle sprain at the time he also had x-rays of his right ankle.  He has discomfort in his feet but no acute flares of gout.  He states he takes allopurinol and meloxicam on a regular basis without any interruption.  None of the other joints are painful or swollen.  He states he drinks 8 to 10, 12 oz. cans of beer daily.  He states he has been drinking since he was  73 years old.  He is unable to use topical drinking.  He is a former smoker and quit smoking in 1980.  He is a [place] veteran.  He was in the [employer].  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  He is to enjoy playing golf but now he is unable to do that.  He states he has difficulty walking and falls frequently.  Uses a walker at home for ambulation.  He states he had about 8 falls last 2 years.  He states his father had gout.  He is married and has 1 stepchild.    Activities of Daily Living:  Patient reports morning stiffness for all day. Patient Reports nocturnal pain.  Difficulty dressing/grooming: Reports Difficulty climbing stairs: Reports Difficulty getting out of chair: Reports Difficulty using hands for taps, buttons, cutlery, and/or writing: Reports  Review of Systems  Constitutional:  Positive for fatigue.  HENT:  Positive for mouth sores, mouth dryness and nose dryness.   Eyes:  Positive for dryness.  Respiratory:  Positive for shortness of breath.   Cardiovascular:  Negative for palpitations.  Gastrointestinal:  Negative for blood in stool, constipation and diarrhea.  Endocrine: Positive  for increased urination.  Genitourinary:  Negative for involuntary urination.  Musculoskeletal:  Positive for joint pain, gait problem, joint pain, joint swelling, myalgias, muscle weakness, morning stiffness, muscle tenderness and myalgias.  Skin:  Negative for color change, rash and sensitivity to sunlight.  Allergic/Immunologic: Negative for susceptible to infections.  Neurological:  Positive for dizziness, numbness and headaches.  Hematological:  Positive for bruising/bleeding tendency. Negative for swollen glands.  Psychiatric/Behavioral:  Positive for sleep disturbance. Negative for depressed mood. The patient is nervous/anxious.     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   Chronic gout of multiple sites 08/03/2021   Pressure injury of skin 05/15/2021   Prostate cancer (HCC) 05/14/2021   Persistent atrial fibrillation (HCC) 05/14/2021   Morbid obesity (HCC) 12/12/2018   (HFpEF) heart failure with preserved ejection fraction (HCC) 12/10/2018   Multiple rib fractures 12/10/2018   OSA (obstructive sleep apnea) 12/10/2018   Chest pain syndrome 12/30/2017   Carcinoma of prostate (HCC) 08/02/2016   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   GERD (gastroesophageal reflux disease) 08/02/2016   Mixed hyperlipidemia 08/02/2016   Peripheral neuropathy 08/02/2016   Steatosis of liver 08/02/2016   Type 2 diabetes mellitus with obesity (HCC) 08/02/2016   Benign essential hypertension 06/11/2013   Chronic pain due to trauma 06/11/2013   Alcohol dependence (HCC) 03/26/2013   PTSD (post-traumatic stress disorder) 03/26/2013   Depressive disorder 03/24/2013    Past Medical History:  Diagnosis Date   Alcohol dependence (HCC)    BPH (benign prostatic hyperplasia)    Cancer (HCC)    prostate   COPD (chronic obstructive pulmonary disease) (HCC)    Diabetes mellitus without complication (HCC)    Dyspnea on exertion 07/14/2022   Elevated troponin 04/06/2019   Fall at home, initial encounter 09/14/2022   Hypercholesteremia    Hypertension    OSA on CPAP     Family History  Problem Relation Age of Onset   CAD Mother    Leukemia Mother    Heart attack Father    Heart attack Brother    Obesity Brother    Past Surgical History:  Procedure Laterality Date   ACHILLES  TENDON REPAIR Left    CARDIAC CATHETERIZATION     CARPAL TUNNEL RELEASE Right    KNEE SURGERY Left    RIGHT/LEFT HEART CATH AND CORONARY ANGIOGRAPHY N/A 04/09/2019   Procedure: RIGHT/LEFT HEART CATH AND CORONARY ANGIOGRAPHY;  Surgeon: [place], Peter M, MD;  Location: [facility];  Service: Cardiovascular;  Laterality: N/A;   TONSILLECTOMY     WRIST FRACTURE SURGERY Left    Social History   Social History Narrative   Not on file   Immunization History  Administered Date(s) Administered   Influenza, High Dose Seasonal PF 01/29/2018   Tdap 05/01/2023     Objective: Vital Signs: BP 103/63 (BP Location: Right Arm, Patient Position: Sitting, Cuff Size: Large)   Pulse (!) 57   Resp 18   Ht 5' 8.5" (1.74 m)   Wt (!) 317 lb 3.2 oz (143.9 kg)  BMI 47.53 kg/m    Physical Exam Vitals and nursing note reviewed.  Constitutional:      Appearance: He is well-developed.  HENT:     Head: Normocephalic and atraumatic.  Eyes:     Conjunctiva/sclera: Conjunctivae normal.     Pupils: Pupils are equal, round, and reactive to light.  Cardiovascular:     Rate and Rhythm: Normal rate and regular rhythm.     Heart sounds: Normal heart sounds.  Pulmonary:     Effort: Pulmonary effort is normal.     Breath sounds: Normal breath sounds.  Abdominal:     General: Bowel sounds are normal.     Palpations: Abdomen is soft.  Musculoskeletal:     Cervical back: Normal range of motion and neck supple.  Skin:    General: Skin is warm and dry.     Capillary Refill: Capillary refill takes less than 2 seconds.  Neurological:     Mental Status: He is alert and oriented to person, place, and time.  Psychiatric:        Behavior: Behavior normal.      Musculoskeletal Exam: He had limited painful range of motion of the cervical spine.  Lumbar spine range of motion could not be assessed due to body habitus.  Patient was examined in the seated position.  Shoulder joints and elbow joints in good  range of motion.  He had surgical scar over bilateral wrist joints due to previous surgery.  Some puffiness was noted on the dorsal aspect of his right wrist which was nontender.  There was no synovitis over MCPs PIPs or DIPs.  Hip joints could not be assessed in the seated position.  Knee joints were in good range of motion without any warmth swelling or effusion.  He had 4+ pitting edema on his both lower extremities.  There was no tenderness on palpation over ankles or MTPs.  CDAI Exam: CDAI Score: -- Patient Global: --; Provider Global: -- Swollen: --; Tender: -- Joint Exam 07/05/2023   No joint exam has been documented for this visit   There is currently no information documented on the homunculus. Go to the Rheumatology activity and complete the homunculus joint exam.  Investigation: No additional findings.  Imaging: No results found.  Recent Labs: Lab Results  Component Value Date   WBC 7.9 05/01/2023   HGB 13.3 05/01/2023   PLT 203 05/01/2023   NA 129 (L) 05/01/2023   K 4.7 05/01/2023   CL 97 (L) 05/01/2023   CO2 19 (L) 05/01/2023   GLUCOSE 107 (H) 05/01/2023   BUN 49 (H) 05/01/2023   CREATININE 2.20 (H) 05/01/2023   BILITOT 1.1 05/01/2023   ALKPHOS 96 05/01/2023   AST 29 05/01/2023   ALT 16 05/01/2023   PROT 6.6 05/01/2023   ALBUMIN 4.0 05/01/2023   CALCIUM 10.6 (H) 05/01/2023   GFRAA >60 12/16/2019   September 2024 CMP creatinine 1.91 GFR 52, AST 21, ALT 39  November 29, 2022 x-ray right knee showed chondrocalcinosis, trace effusion, mild tricompartmental osteoarthritis, proximal fibular exostosis per radiology report Left knee joint x-ray chondrocalcinosis tricompartment osteoarthritis with severe involvement of medial femoral-tibial joint X-ray lumbar spine moderate and severe lower lumbar spondylosis and vascular calcification per radiology report from [facility]  Speciality Comments: No specialty comments available.  Procedures:  No procedures  performed Allergies: Oxcarbazepine and Zolpidem   Assessment / Plan:     Visit Diagnoses: Idiopathic chronic gout of multiple sites without tophus-patient states his gout symptoms  started about 3 years when he started taking diuretics for pedal edema.  He was initially treated with colchicine and then was started on allopurinol.  He states the allopurinol was recently increased to 400 mg p.o. daily.  He states he has not had a gout flare but he continues to have discomfort in multiple joints.  He also takes meloxicam 15 mg p.o. daily due to underlying arthritis in multiple joints.  Patient drinks about 8-10, 12 ounce beer cans daily.  He states he cannot stop drinking alcohol as he has been drinking since he was 15.  Detail counseled regarding gout was provided.  A handout on management of gout was also provided.  Dietary modifications and cutting back on alcohol was discussed at length.  His last uric acid was 4.1 in January 2024.  I will check her uric acid again today.  Pain in both hands -he complains of a stiffness in his hands which he relates to previous injuries.  Some puffiness was noted over the dorsum of his right wrist but had good range of motion without tenderness.  Plan: XR Hand 2 View Right, XR Hand 2 View Left.  X-rays of bilateral hands with history of osteoarthritis.  An erosion was noted in the right first metacarpal head suggestive of gouty arthropathy.  Chondrocalcinosis of both knees-according to the reports he has chondrocalcinosis in his knees.  I will check magnesium, iron, TIBC and ferritin panel today.  Chronic pain of both knees -he complains of discomfort in his bilateral knee joints.  I do not have x-rays to review.  Patient states he has end-stage osteoarthritis.  He has difficulty with mobility.  He ambulates with the help of a rollator walker at home.  Plan: Uric acid, Rheumatoid factor  Pain in both feet -he complains of discomfort in his bilateral feet.  No tenderness  was elicited over ankles or MTPs today.  He has significant pedal edema.  Plan: XR Foot 2 Views Right, XR Foot 2 Views Left.  X-rays were suggestive of osteoarthritis and inflammatory arthritis overlap.  DDD (degenerative disc disease), cervical-patient states he has disc disease of cervical spine.  CT of the cervical spine from May 01, 2023 was reviewed which showed trace anterolisthesis of C3 on C4 and C7 on T1.  Moderate multilevel disc degeneration and advanced facet arthrosis with moderate to severe multilevel neuroforaminal stenosis was noted.  Lumbar spondylosis-CT of the lumbar spine from Oct 11, 2022 showed disc space narrowing and endplate ridging of L2-3 and below.  Degenerative facet spurring at the same levels greatest at C3-C4 per radiology report.  Other medical problems are listed as follows:  Type 2 diabetes mellitus with obesity (HCC)  Pedal edema-he is difficult bilateral pedal edema.  He states he is unable to use compression socks due to his body habitus.  Chronic heart failure with preserved ejection fraction (HCC)  Persistent atrial fibrillation (HCC)  Dyslipidemia  Benign essential hypertension-blood pressure was normal today but 103/63.  I advised him to monitor blood pressure closely as he falls frequently per patient.  Stage 3a chronic kidney disease (HCC)-creatinine was elevated at 2.20 on May 01, 2023.  The patient may be due to diuretic use.  I advised him to discuss nephrology referral with his PCP.  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  Steatosis of liver  Gastroesophageal reflux disease without esophagitis  Tinnitus of both ears  Alcohol use - 8-10 beer 12 oz cans daily.  Cutting back on alcohol and abstinence  from alcohol was discussed.  Carcinoma of prostate (HCC)  Anxiety and depression  OSA (obstructive sleep apnea) - uses CPAP  Vitamin D deficiency  Frequent falls - 8 falls in 2024 per patient. He uses a rollator walker  In  the house.  He came in without a rollator walker to the office.  Need for using walker on a regular basis was emphasized.  I also advised him to discuss physical therapy through [facility].  Former smoker - 1 PPD X 13 years. Quit smoking 1980.  Morbid obesity (HCC) - BMI 47.53  Orders: Orders Placed This Encounter  Procedures   XR Hand 2 View Right   XR Hand 2 View Left   XR Foot 2 Views Right   XR Foot 2 Views Left   Uric acid   Rheumatoid factor   Magnesium   Iron, TIBC and Ferritin Panel   No orders of the defined types were placed in this encounter.   Face-to-face time spent with patient was over 45 minutes. Greater than 50% of time was spent in counseling and coordination of care.  Follow-Up Instructions: Return for Gout.   Pollyann Savoy, MD  Note - This record has been created using Animal nutritionist.  Chart creation errors have been sought, but may not always  have been located. Such creation errors do not reflect on  the standard of medical care.

## 2023-07-05 ENCOUNTER — Encounter: Payer: Self-pay | Admitting: Rheumatology

## 2023-07-05 ENCOUNTER — Ambulatory Visit: Payer: No Typology Code available for payment source | Attending: Rheumatology

## 2023-07-05 ENCOUNTER — Ambulatory Visit: Payer: No Typology Code available for payment source

## 2023-07-05 ENCOUNTER — Ambulatory Visit: Payer: No Typology Code available for payment source | Attending: Internal Medicine | Admitting: Rheumatology

## 2023-07-05 VITALS — BP 103/63 | HR 57 | Resp 18 | Ht 68.5 in | Wt 317.2 lb

## 2023-07-05 DIAGNOSIS — M79642 Pain in left hand: Secondary | ICD-10-CM

## 2023-07-05 DIAGNOSIS — G4733 Obstructive sleep apnea (adult) (pediatric): Secondary | ICD-10-CM | POA: Insufficient documentation

## 2023-07-05 DIAGNOSIS — F419 Anxiety disorder, unspecified: Secondary | ICD-10-CM | POA: Insufficient documentation

## 2023-07-05 DIAGNOSIS — M1A09X0 Idiopathic chronic gout, multiple sites, without tophus (tophi): Secondary | ICD-10-CM | POA: Insufficient documentation

## 2023-07-05 DIAGNOSIS — E785 Hyperlipidemia, unspecified: Secondary | ICD-10-CM | POA: Insufficient documentation

## 2023-07-05 DIAGNOSIS — M11261 Other chondrocalcinosis, right knee: Secondary | ICD-10-CM | POA: Insufficient documentation

## 2023-07-05 DIAGNOSIS — I10 Essential (primary) hypertension: Secondary | ICD-10-CM | POA: Insufficient documentation

## 2023-07-05 DIAGNOSIS — M79672 Pain in left foot: Secondary | ICD-10-CM | POA: Insufficient documentation

## 2023-07-05 DIAGNOSIS — Z87891 Personal history of nicotine dependence: Secondary | ICD-10-CM | POA: Insufficient documentation

## 2023-07-05 DIAGNOSIS — M79671 Pain in right foot: Secondary | ICD-10-CM | POA: Diagnosis not present

## 2023-07-05 DIAGNOSIS — F32A Depression, unspecified: Secondary | ICD-10-CM | POA: Insufficient documentation

## 2023-07-05 DIAGNOSIS — M25561 Pain in right knee: Secondary | ICD-10-CM | POA: Diagnosis not present

## 2023-07-05 DIAGNOSIS — R600 Localized edema: Secondary | ICD-10-CM | POA: Insufficient documentation

## 2023-07-05 DIAGNOSIS — R296 Repeated falls: Secondary | ICD-10-CM | POA: Insufficient documentation

## 2023-07-05 DIAGNOSIS — M5030 Other cervical disc degeneration, unspecified cervical region: Secondary | ICD-10-CM | POA: Insufficient documentation

## 2023-07-05 DIAGNOSIS — M79641 Pain in right hand: Secondary | ICD-10-CM | POA: Insufficient documentation

## 2023-07-05 DIAGNOSIS — G8929 Other chronic pain: Secondary | ICD-10-CM | POA: Insufficient documentation

## 2023-07-05 DIAGNOSIS — M25562 Pain in left knee: Secondary | ICD-10-CM | POA: Insufficient documentation

## 2023-07-05 DIAGNOSIS — E559 Vitamin D deficiency, unspecified: Secondary | ICD-10-CM | POA: Insufficient documentation

## 2023-07-05 DIAGNOSIS — M47816 Spondylosis without myelopathy or radiculopathy, lumbar region: Secondary | ICD-10-CM | POA: Insufficient documentation

## 2023-07-05 DIAGNOSIS — H9313 Tinnitus, bilateral: Secondary | ICD-10-CM | POA: Insufficient documentation

## 2023-07-05 DIAGNOSIS — C61 Malignant neoplasm of prostate: Secondary | ICD-10-CM | POA: Insufficient documentation

## 2023-07-05 DIAGNOSIS — E669 Obesity, unspecified: Secondary | ICD-10-CM | POA: Insufficient documentation

## 2023-07-05 DIAGNOSIS — I4819 Other persistent atrial fibrillation: Secondary | ICD-10-CM | POA: Insufficient documentation

## 2023-07-05 DIAGNOSIS — I5032 Chronic diastolic (congestive) heart failure: Secondary | ICD-10-CM | POA: Insufficient documentation

## 2023-07-05 DIAGNOSIS — J4489 Other specified chronic obstructive pulmonary disease: Secondary | ICD-10-CM | POA: Insufficient documentation

## 2023-07-05 DIAGNOSIS — K219 Gastro-esophageal reflux disease without esophagitis: Secondary | ICD-10-CM | POA: Insufficient documentation

## 2023-07-05 DIAGNOSIS — Z789 Other specified health status: Secondary | ICD-10-CM | POA: Insufficient documentation

## 2023-07-05 DIAGNOSIS — E1169 Type 2 diabetes mellitus with other specified complication: Secondary | ICD-10-CM | POA: Insufficient documentation

## 2023-07-05 DIAGNOSIS — N1831 Chronic kidney disease, stage 3a: Secondary | ICD-10-CM | POA: Insufficient documentation

## 2023-07-05 DIAGNOSIS — K760 Fatty (change of) liver, not elsewhere classified: Secondary | ICD-10-CM | POA: Insufficient documentation

## 2023-07-05 DIAGNOSIS — M11262 Other chondrocalcinosis, left knee: Secondary | ICD-10-CM | POA: Insufficient documentation

## 2023-07-05 NOTE — Patient Instructions (Signed)
Information for patients with Gout  Gout defined-Gout occurs when urate crystals accumulate in your joint causing the inflammation and intense pain of gout attack.  Urate crystals can form when you have high levels of uric acid in your blood.  Your body produces uric acid when it breaks down prurines-substances that are found naturally in your body, as well as in certain foods such as organ meats, anchioves, herring, asparagus, and mushrooms.  Normally uric acid dissolves in your blood and passes through your kidneys into your urine.  But sometimes your body either produces too much uric acid or your kidneys excrete too little uric acid.  When this happens, uric acid can build up, forming sharp needle-like urate crystals in a joint or surrounding tissue that cause pain, inflammation and swelling.    Gout is characterized by sudden, severe attacks of pain, redness and tenderness in joints, often the joint at the base of the big toe.  Gout is complex form of arthritis that can affect anyone.  Men are more likely to get gout but women become increasingly more susceptible to gout after menopause.  An acute attack of gout can wake you up in the middle of the night with the sensation that your big toe is on fire.  The affected joint is hot, swollen and so tender that even the weight or the sheet on it may seem intolerable.  If you experience symptoms of an acute gout attack it is important to your doctor as soon as the symptoms start.  Gout that goes untreated can lead to worsening pain and joint damage.  Risk Factors:  You are more likely to develop gout if you have high levels of uric acid in your body.    Factors that increase the uric acid level in your body include:  Lifestyle factors.  Excessive alcohol use-generally more than two drinks a day for men and more than one for women increase the risk of gout.  Medical conditions.  Certain conditions make it more likely that you will develop gout.   These include hypertension, and chronic conditions such as diabetes, high levels of fat and cholesterol in the blood, and narrowing of the arteries.  Certain medications.  The uses of Thiazide diuretics- commonly used to treat hypertension and low dose aspirin can also increase uric acid levels.  Family history of gout.  If other members of your family have had gout, you are more likely to develop the disease.  Age and sex. Gout occurs more often in men than it does in women, primarily because women tend to have lower uric acid levels than men do.  Men are more likely to develop gout earlier usually between the ages of 61-50- whereas women generally develop signs and symptoms after menopause.    Tests and diagnosis:  Tests to help diagnose gout may include:  Blood test.  Your doctor may recommend a blood test to measure the uric acid level in your blood .  Blood tests can be misleading, though.  Some people have high uric acid levels but never experience gout.  And some people have signs and symptoms of gout, but don't have unusual levels of uric acid in their blood.  Joint fluid test.  Your doctor may use a needle to draw fluid from your affected joint.  When examined under the microscope, your joint fluid may reveal urate crystals.  Treatment:  Treatment for gout usually involves medications.  What medications you and your doctor choose will be  based on your current health and other medications you currently take.  Gout medications can be used to treat acute gout attacks and prevent future attacks as well as reduce your risk of complications from gout such as the development of tophi from urate crystal deposits.  Alternative medicine:   Certain foods have been studied for their potential to lower uric acid levels, including:  Coffee.  Studies have found an association between coffee drinking (regular and decaf) and lower uric acid levels.  The evidence is not enough to encourage  non-coffee drinkers to start, but it may give clues to new ways of treating gout in the future.  Vitamin C.  Supplements containing vitamin C may reduce the levels of uric acid in your blood.  However, vitamin as a treatment for gout. Don't assume that if a little vitamin C is good, than lots is better.  Megadoses of vitamin C may increase your bodies uric acid levels.  Cherries.  Cherries have been associated with lower levels of uric acid in studies, but it isn't clear if they have any effect on gout signs and symptoms.  Eating more cherries and other dar-colored fruits, such as blackberries, blueberries, purple grapes and raspberries, may be a safe way to support your gout treatment.    Lifestyle/Diet Recommendations:   Drink 8 to 16 cups ( about 2 to 4 liters) of fluid each day, with at least half being water.  Avoid alcohol  Eat a moderate amount of protein, preferably from healthy sources, such as low-fat or fat-free dairy, tofu, eggs, and nut butters.  Limit you daily intake of meat, fish, and poultry to 4 to 6 ounces.  Avoid high fat meats and desserts.  Decrease you intake of shellfish, beef, lamb, pork, eggs and cheese.  Choose a good source of vitamin C daily such as citrus fruits, strawberries, broccoli,  brussel sprouts, papaya, and cantaloupe.   Choose a good source of vitamin A every other day such as yellow fruits, or dark green/yellow vegetables.  Avoid drastic weigh reduction or fasting.  If weigh loss is desired lose it over a period of several months.  See "dietary considerations.." chart for specific food recommendations.  Dietary Considerations for people with Gout  Food with negligible purine content (0-15 mg of purine nitrogen per 100 grams food)  May use as desired except on calorie variations  Non fat milk Cocoa Cereals (except in list II) Hard candies  Buttermilk Carbonated drinks Vegetables (except in list II) Sherbet  Coffee Fruits Sugar Honey  Tea  Cottage Cheese Gelatin-jell-o Salt  Fruit juice Breads Angel food Cake   Herbs/spices Jams/Jellies [employer]    Foods that do not contain excessive purine content, but must be limited due to fat content  Cream Eggs Oil and Salad Dressing  Half and Half Peanut Butter Chocolate  Whole Milk Cakes Potato Chips  Butter Ice Cream Fried Foods  Cheese Nuts Waffles, pancakes   List II: Food with moderate purine content (50-150 mg of purine nitrogen per 100 grams of food)  Limit total amount each day to 5 oz. cooked Lean meat, other than those on list III   Poultry, other than those on list III Fish, other than those on list III   Seafood, other than those on list III  These foods may be used occasionally  Peas Lentils Bran  Spinach Oatmeal Dried Beans and Peas  Asparagus Wheat Germ Mushrooms   Additional information about meat choices  Choose fish  and poultry, particularly without skin, often.  Select lean, well trimmed cuts of meat.  Avoid all fatty meats, bacon , sausage, fried meats, fried fish, or poultry, luncheon meats, cold cuts, hot dogs, meats canned or frozen in gravy, spareribs and frozen and packaged prepared meats.   List III: Foods with HIGH purine content / Foods to AVOID (150-800 mg of purine nitrogen per 100 grams of food)  Anchovies Herring Meat Broths  Liver Mackerel Meat Extracts  Kidney Scallops Meat Drippings  Sardines Wild Game Mincemeat  Sweetbreads Goose Gravy  Heart Tongue Yeast, baker's and brewers   Commercial soups made with any of the foods listed in List II or List III  In addition avoid all alcoholic beverages

## 2023-07-06 LAB — URIC ACID: Uric Acid, Serum: 2.9 mg/dL — ABNORMAL LOW (ref 4.0–8.0)

## 2023-07-06 LAB — RHEUMATOID FACTOR: Rheumatoid fact SerPl-aCnc: <10 [IU]/mL (ref <14)

## 2023-07-06 LAB — IRON,TIBC AND FERRITIN PANEL
%SAT: 24 % (ref 20–48)
Ferritin: 57 ng/mL (ref 24–380)
Iron: 103 ug/dL (ref 50–180)
TIBC: 429 ug/dL — ABNORMAL HIGH (ref 250–425)

## 2023-07-06 LAB — MAGNESIUM: Magnesium: 2.6 mg/dL — ABNORMAL HIGH (ref 1.5–2.5)

## 2023-07-06 NOTE — Progress Notes (Signed)
RF negative, ferritin normal, magnesium normal, uric acid is in the desirable range.  No change in treatment advised.

## 2023-07-21 NOTE — Progress Notes (Signed)
 Office Visit Note  Patient: Francis Cardenas             Date of Birth: Dec 24, 1950           MRN: [id number]             PCP: Benjiman Core, MD Referring: Benjiman Core* Visit Date: 08/04/2023 Occupation: @GUAROCC @  Subjective:  A fall this morning  History of Present Illness: Francis Cardenas is a 73 y.o. male with gout and osteoarthritis.  He returns today after his initial visit on July 05, 2023.  He states this morning he got up to use the restroom and when he came back he picked up the blanket and twisted and lost his balance.  He states he hit his right forearm against footboard of the bed.  He states after some time he started noticing soreness in his right forearm and also on the right side of his abdomen.  He had no loss of function of his forearm and he did not go to the emergency room.  He continues to have some soreness.  He has not had any gout flares since the last visit although he continues to have pain and discomfort in his hands, knee joints and his feet.  He continues to have a stiffness in his neck and lower back.  He takes allopurinol 400 mg a day and has not had a flare of gout.  He also takes meloxicam for pain relief.  None of the other joints are painful.  His other concern is significant fluid retention in his legs.    Activities of Daily Living:  Patient reports morning stiffness for all day. Patient Reports nocturnal pain.  Difficulty dressing/grooming: Reports Difficulty climbing stairs: Reports Difficulty getting out of chair: Reports Difficulty using hands for taps, buttons, cutlery, and/or writing: Reports  Review of Systems  Constitutional:  Positive for fatigue.  HENT:  Positive for mouth dryness. Negative for mouth sores.   Eyes:  Positive for dryness.  Respiratory:  Positive for shortness of breath.   Cardiovascular:  Negative for chest pain and palpitations.  Gastrointestinal:  Negative for blood in stool, constipation and  diarrhea.  Endocrine: Positive for increased urination.  Genitourinary:  Negative for involuntary urination.  Musculoskeletal:  Positive for joint pain, gait problem, joint pain, joint swelling, myalgias, muscle weakness, morning stiffness, muscle tenderness and myalgias.  Skin:  Positive for rash. Negative for color change and sensitivity to sunlight.  Allergic/Immunologic: Negative for susceptible to infections.  Neurological:  Positive for numbness and headaches. Negative for dizziness.  Hematological:  Negative for swollen glands.  Psychiatric/Behavioral:  Negative for depressed mood and sleep disturbance. The patient is nervous/anxious.     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   Chronic gout of multiple sites 08/03/2021   Pressure injury of skin 05/15/2021   Prostate cancer (HCC) 05/14/2021   Persistent atrial fibrillation (HCC) 05/14/2021   Morbid obesity (HCC) 12/12/2018   (HFpEF) heart failure with preserved ejection fraction (HCC) 12/10/2018   Multiple rib fractures 12/10/2018   OSA (  obstructive sleep apnea) 12/10/2018   Chest pain syndrome 12/30/2017   Carcinoma of prostate (HCC) 08/02/2016   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   GERD (gastroesophageal reflux disease) 08/02/2016   Mixed hyperlipidemia 08/02/2016   Peripheral neuropathy 08/02/2016   Steatosis of liver 08/02/2016   Type 2 diabetes mellitus with obesity (HCC) 08/02/2016   Benign essential hypertension 06/11/2013   Chronic pain due to trauma  06/11/2013   Alcohol dependence (HCC) 03/26/2013   PTSD (post-traumatic stress disorder) 03/26/2013   Depressive disorder 03/24/2013    Past Medical History:  Diagnosis Date   Alcohol dependence (HCC)    BPH (benign prostatic hyperplasia)    Cancer (HCC)    prostate   COPD (chronic obstructive pulmonary disease) (HCC)    Diabetes mellitus without complication (HCC)    Dyspnea on exertion 07/14/2022   Elevated troponin 04/06/2019   Fall at home, initial encounter 09/14/2022   Hypercholesteremia    Hypertension    OSA on CPAP     Family History  Problem Relation Age of Onset   CAD Mother    Leukemia Mother    Heart attack Father    Heart attack Brother    Obesity Brother    Past Surgical History:  Procedure Laterality Date   ACHILLES TENDON REPAIR Left    CARDIAC CATHETERIZATION     CARPAL TUNNEL RELEASE Right    KNEE SURGERY Left    RIGHT/LEFT HEART CATH AND CORONARY ANGIOGRAPHY N/A 04/09/2019   Procedure: RIGHT/LEFT HEART CATH AND CORONARY ANGIOGRAPHY;  Surgeon: [place], Peter M, MD;  Location: [facility];  Service: Cardiovascular;  Laterality: N/A;   TONSILLECTOMY     WRIST FRACTURE SURGERY Left    Social History   Social History Narrative   Not on file   Immunization History  Administered Date(s) Administered   Influenza, High Dose Seasonal PF 01/29/2018   Tdap 05/01/2023     Objective: Vital Signs: BP 111/60 (BP Location: Left Arm, Patient Position: Sitting, Cuff Size: Large)   Pulse 70   Resp 16   Ht 5' 8.5" (1.74 m)   Wt (!) 323 lb 9.6 oz (146.8 kg)   BMI 48.49 kg/m    Physical Exam Vitals and nursing note reviewed.  Constitutional:      Appearance: He is well-developed.  HENT:     Head: Normocephalic and atraumatic.  Eyes:     Conjunctiva/sclera: Conjunctivae normal.     Pupils: Pupils are equal, round, and reactive to light.  Cardiovascular:     Rate and Rhythm: Normal rate and regular rhythm.     Heart sounds: Normal heart sounds.   Pulmonary:     Effort: Pulmonary effort is normal.     Breath sounds: Normal breath sounds.  Abdominal:     General: Bowel sounds are normal.     Palpations: Abdomen is soft.  Musculoskeletal:     Cervical back: Normal range of motion and neck supple.  Skin:    General: Skin is warm and dry.     Capillary Refill: Capillary refill takes less than 2 seconds.  Neurological:     Mental Status: He is alert and oriented to person, place, and time.  Psychiatric:        Behavior: Behavior normal.      Musculoskeletal Exam: Patient had limited lateral rotation of the cervical spine with some stiffness.  Lumbar spine could not be assessed in the seated position.  Shoulders and elbow joints were in good range of  motion.  He did not have any point tenderness over the right forearm where he stated head injury.  Both wrist joints were in good range of motion without any synovitis.  There was no synovitis over MCPs PIPs or DIPs.  Bilateral PIP and DIP thickening was noted.  Hip joints could not be assessed in the seated position.  Knee joints were in good range of motion without any warmth swelling or effusion.  Lower extremity edema was noted.  There was no tenderness over ankles or MTPs.  CDAI Exam: CDAI Score: -- Patient Global: --; Provider Global: -- Swollen: --; Tender: -- Joint Exam 08/04/2023   No joint exam has been documented for this visit   There is currently no information documented on the homunculus. Go to the Rheumatology activity and complete the homunculus joint exam.  Investigation: No additional findings.  Imaging: XR Foot 2 Views Left Result Date: 07/05/2023 First MTP, PIP and DIP narrowing was noted.  Intertarsal and subtalar joint space narrowing was noted.  Calcification was noted in the Achillis tendon. Impression: These findings with history of osteoarthritis and inflammatory arthritis overlap.  XR Foot 2 Views Right Result Date: 07/05/2023 Severe PIP and DIP  narrowing was noted.  No MTP narrowing was noted.  Severe intertarsal joint space narrowing was noted.  Tibiotalar and subtalar joint space narrowing was noted.  Cystic versus erosive changes were noted in the tarsal bones. Impression: These findings are suggestive of osteoarthritis and inflammatory arthritis overlap.  XR Hand 2 View Left Result Date: 07/05/2023 CMC, PIP and DIP narrowing was noted.  No intercarpal or radiocarpal joint space narrowing was noted.  No erosive changes were noted. Impression: These findings are suggestive of osteoarthritis of the hand.  XR Hand 2 View Right Result Date: 07/05/2023 CMC, PIP and DIP narrowing was noted.  An erosion was noted over the first metacarpal head.  Mild intercarpal joint space narrowing was noted. Impression: These findings are suggestive of osteoarthritis and inflammatory arthritis most likely gouty arthropathy.   Recent Labs: Lab Results  Component Value Date   WBC 7.9 05/01/2023   HGB 13.3 05/01/2023   PLT 203 05/01/2023   NA 129 (L) 05/01/2023   K 4.7 05/01/2023   CL 97 (L) 05/01/2023   CO2 19 (L) 05/01/2023   GLUCOSE 107 (H) 05/01/2023   BUN 49 (H) 05/01/2023   CREATININE 2.20 (H) 05/01/2023   BILITOT 1.1 05/01/2023   ALKPHOS 96 05/01/2023   AST 29 05/01/2023   ALT 16 05/01/2023   PROT 6.6 05/01/2023   ALBUMIN 4.0 05/01/2023   CALCIUM 10.6 (H) 05/01/2023   GFRAA >60 12/16/2019   07/05/23 uric acid 2.9, RF negative, magnesium 2.6, iron studies iron TIBC 4.29 Speciality Comments: No specialty comments available.  Procedures:  No procedures performed Allergies: Oxcarbazepine and Zolpidem   Assessment / Plan:     Visit Diagnoses: Idiopathic chronic gout of multiple sites without tophus-patient states he has had gout for at least 3 years.  He has been doing well on allopurinol 400 mg daily without any gout flares.  He also takes meloxicam 15 mg at bedtime.  He continues to drink beer.  He states he cannot stop drinking beer  as he has been drinking it for a long time.  Uric acid was 2.9 which was in the desirable range.  Rheumatoid factor was negative.  Primary osteoarthritis of both hands -he has a stiffness in his hands.  No synovitis was noted.  PIP and DIP thickening  was noted.  X-rays obtained at the last visit showed overlap of osteoarthritis and gouty arthropathy.  A possible erosion was noted on the right first MCP suggestive of gouty arthropathy.  Chondrocalcinosis of both knees-previous report shows chondrocalcinosis in the knee joints.  He had no warmth swelling or effusion.  Magnesium mildly elevated as he takes magnesium supplement.  TIBC was mildly elevated.  Primary osteoarthritis of both knees -he continues to have difficulty walking due to knee joint discomfort.  According to the patient he has end-stage osteoarthritis.  He ambulates with the help of a rollator walker.  He did not bring rollator walker with him.  Need for using rollator walker or a cane was emphasized.   Primary osteoarthritis of both feet-he complains of discomfort in his bilateral feet.  X-rays obtained at the last visit were suggestive of osteoarthritis and gout overlap.  DDD (degenerative disc disease), cervical -patient had limited range of motion of the cervical spine with stiffness.  CT cervical spine December 2024 showed trace anterolisthesis of C3 on C4 and C7 and T1 with multilevel disc disease and advanced facet joint arthropathy  Lumbar spondylosis -he continues to have some lower back pain.  CT scan 2024 showed multilevel spondylosis and facet joint arthropathy.  History of recent fall-patient had a fall this morning.  He had some discomfort in his right forearm and the abdominal region.  He had no point tenderness on the examination.  I offered sending him for x-rays of the right forearm which she declined.  I told him to go to the emergency room or the urgent care if his symptoms get worse.  I discussed the risk of internal  bleeding as he is on Eliquis.  Patient states he had another fall in September.  He will benefit from physical therapy.  He states he will discuss with his PCP.  Other medical problems are listed as follows:  Benign essential hypertension-pressure was 111/60 today.  Persistent atrial fibrillation (HCC)  Chronic heart failure with preserved ejection fraction (HCC)  Pedal edema-he has significant 4+ pedal edema on his bilateral lower extremities.  Benefits of using compression socks were discussed.  Type 2 diabetes mellitus with obesity (HCC)  Dyslipidemia  Stage 3a chronic kidney disease (HCC)  COPD with asthma (HCC)  Steatosis of liver  Gastroesophageal reflux disease without esophagitis  Tinnitus of both ears  Carcinoma of prostate (HCC)  Alcohol use - 8-10 beer 12 oz cans daily.  Cutting back on alcohol intake and eventually abstinence from alcohol was discussed.  Anxiety and depression  OSA (obstructive sleep apnea) - Patient is on CPAP.  Vitamin D deficiency  Frequent falls - Patient reported 8 falls in 2024.  Former smoker - 1 pack/day for 13 years and quit smoking in 1980.  Morbid obesity (HCC)-BMI 48.49  Orders: No orders of the defined types were placed in this encounter.  No orders of the defined types were placed in this encounter.    Follow-Up Instructions: Return if symptoms worsen or fail to improve, for Osteoarthritis, Gout.   Pollyann Savoy, MD  Note - This record has been created using Animal nutritionist.  Chart creation errors have been sought, but may not always  have been located. Such creation errors do not reflect on  the standard of medical care.

## 2023-08-04 ENCOUNTER — Encounter: Payer: Self-pay | Admitting: Rheumatology

## 2023-08-04 ENCOUNTER — Ambulatory Visit: Payer: No Typology Code available for payment source | Attending: Internal Medicine | Admitting: Rheumatology

## 2023-08-04 VITALS — BP 111/60 | HR 70 | Resp 16 | Ht 68.5 in | Wt 323.6 lb

## 2023-08-04 DIAGNOSIS — J4489 Other specified chronic obstructive pulmonary disease: Secondary | ICD-10-CM | POA: Insufficient documentation

## 2023-08-04 DIAGNOSIS — F32A Depression, unspecified: Secondary | ICD-10-CM | POA: Insufficient documentation

## 2023-08-04 DIAGNOSIS — Z87891 Personal history of nicotine dependence: Secondary | ICD-10-CM | POA: Insufficient documentation

## 2023-08-04 DIAGNOSIS — C61 Malignant neoplasm of prostate: Secondary | ICD-10-CM | POA: Insufficient documentation

## 2023-08-04 DIAGNOSIS — G4733 Obstructive sleep apnea (adult) (pediatric): Secondary | ICD-10-CM | POA: Insufficient documentation

## 2023-08-04 DIAGNOSIS — E559 Vitamin D deficiency, unspecified: Secondary | ICD-10-CM | POA: Insufficient documentation

## 2023-08-04 DIAGNOSIS — N1831 Chronic kidney disease, stage 3a: Secondary | ICD-10-CM | POA: Insufficient documentation

## 2023-08-04 DIAGNOSIS — E785 Hyperlipidemia, unspecified: Secondary | ICD-10-CM | POA: Insufficient documentation

## 2023-08-04 DIAGNOSIS — M19072 Primary osteoarthritis, left ankle and foot: Secondary | ICD-10-CM | POA: Insufficient documentation

## 2023-08-04 DIAGNOSIS — M19041 Primary osteoarthritis, right hand: Secondary | ICD-10-CM | POA: Insufficient documentation

## 2023-08-04 DIAGNOSIS — E1169 Type 2 diabetes mellitus with other specified complication: Secondary | ICD-10-CM | POA: Insufficient documentation

## 2023-08-04 DIAGNOSIS — R600 Localized edema: Secondary | ICD-10-CM | POA: Insufficient documentation

## 2023-08-04 DIAGNOSIS — I5032 Chronic diastolic (congestive) heart failure: Secondary | ICD-10-CM | POA: Insufficient documentation

## 2023-08-04 DIAGNOSIS — M1A09X0 Idiopathic chronic gout, multiple sites, without tophus (tophi): Secondary | ICD-10-CM | POA: Insufficient documentation

## 2023-08-04 DIAGNOSIS — M170 Bilateral primary osteoarthritis of knee: Secondary | ICD-10-CM | POA: Insufficient documentation

## 2023-08-04 DIAGNOSIS — R296 Repeated falls: Secondary | ICD-10-CM | POA: Insufficient documentation

## 2023-08-04 DIAGNOSIS — M19042 Primary osteoarthritis, left hand: Secondary | ICD-10-CM | POA: Insufficient documentation

## 2023-08-04 DIAGNOSIS — I4819 Other persistent atrial fibrillation: Secondary | ICD-10-CM | POA: Insufficient documentation

## 2023-08-04 DIAGNOSIS — M5030 Other cervical disc degeneration, unspecified cervical region: Secondary | ICD-10-CM | POA: Insufficient documentation

## 2023-08-04 DIAGNOSIS — M19071 Primary osteoarthritis, right ankle and foot: Secondary | ICD-10-CM | POA: Insufficient documentation

## 2023-08-04 DIAGNOSIS — M11262 Other chondrocalcinosis, left knee: Secondary | ICD-10-CM | POA: Insufficient documentation

## 2023-08-04 DIAGNOSIS — K760 Fatty (change of) liver, not elsewhere classified: Secondary | ICD-10-CM | POA: Insufficient documentation

## 2023-08-04 DIAGNOSIS — E669 Obesity, unspecified: Secondary | ICD-10-CM | POA: Insufficient documentation

## 2023-08-04 DIAGNOSIS — I10 Essential (primary) hypertension: Secondary | ICD-10-CM | POA: Insufficient documentation

## 2023-08-04 DIAGNOSIS — H9313 Tinnitus, bilateral: Secondary | ICD-10-CM | POA: Insufficient documentation

## 2023-08-04 DIAGNOSIS — F419 Anxiety disorder, unspecified: Secondary | ICD-10-CM | POA: Insufficient documentation

## 2023-08-04 DIAGNOSIS — Z9181 History of falling: Secondary | ICD-10-CM | POA: Insufficient documentation

## 2023-08-04 DIAGNOSIS — M11261 Other chondrocalcinosis, right knee: Secondary | ICD-10-CM | POA: Diagnosis not present

## 2023-08-04 DIAGNOSIS — M47816 Spondylosis without myelopathy or radiculopathy, lumbar region: Secondary | ICD-10-CM | POA: Insufficient documentation

## 2023-08-04 DIAGNOSIS — Z789 Other specified health status: Secondary | ICD-10-CM | POA: Insufficient documentation

## 2023-08-04 DIAGNOSIS — K219 Gastro-esophageal reflux disease without esophagitis: Secondary | ICD-10-CM | POA: Insufficient documentation

## 2023-08-26 ENCOUNTER — Inpatient Hospital Stay (HOSPITAL_BASED_OUTPATIENT_CLINIC_OR_DEPARTMENT_OTHER)
Admission: EM | Admit: 2023-08-26 | Discharge: 2023-08-31 | DRG: 291 | Disposition: A | Discharge status: Home / Self Care | Attending: Internal Medicine | Admitting: Internal Medicine

## 2023-08-26 ENCOUNTER — Emergency Department (HOSPITAL_BASED_OUTPATIENT_CLINIC_OR_DEPARTMENT_OTHER): Admitting: Radiology

## 2023-08-26 ENCOUNTER — Other Ambulatory Visit: Payer: Self-pay

## 2023-08-26 ENCOUNTER — Encounter (HOSPITAL_BASED_OUTPATIENT_CLINIC_OR_DEPARTMENT_OTHER): Payer: Self-pay

## 2023-08-26 DIAGNOSIS — F1010 Alcohol abuse, uncomplicated: Secondary | ICD-10-CM | POA: Diagnosis not present

## 2023-08-26 DIAGNOSIS — I5033 Acute on chronic diastolic (congestive) heart failure: Secondary | ICD-10-CM | POA: Diagnosis not present

## 2023-08-26 DIAGNOSIS — Z8249 Family history of ischemic heart disease and other diseases of the circulatory system: Secondary | ICD-10-CM | POA: Diagnosis not present

## 2023-08-26 DIAGNOSIS — Z87891 Personal history of nicotine dependence: Secondary | ICD-10-CM

## 2023-08-26 DIAGNOSIS — E7800 Pure hypercholesterolemia, unspecified: Secondary | ICD-10-CM | POA: Diagnosis present

## 2023-08-26 DIAGNOSIS — I10 Essential (primary) hypertension: Secondary | ICD-10-CM | POA: Diagnosis present

## 2023-08-26 DIAGNOSIS — E1142 Type 2 diabetes mellitus with diabetic polyneuropathy: Secondary | ICD-10-CM | POA: Diagnosis present

## 2023-08-26 DIAGNOSIS — Z806 Family history of leukemia: Secondary | ICD-10-CM

## 2023-08-26 DIAGNOSIS — F4310 Post-traumatic stress disorder, unspecified: Secondary | ICD-10-CM | POA: Diagnosis present

## 2023-08-26 DIAGNOSIS — E66813 Obesity, class 3: Secondary | ICD-10-CM | POA: Diagnosis present

## 2023-08-26 DIAGNOSIS — K219 Gastro-esophageal reflux disease without esophagitis: Secondary | ICD-10-CM | POA: Diagnosis not present

## 2023-08-26 DIAGNOSIS — I130 Hypertensive heart and chronic kidney disease with heart failure and stage 1 through stage 4 chronic kidney disease, or unspecified chronic kidney disease: Secondary | ICD-10-CM | POA: Diagnosis not present

## 2023-08-26 DIAGNOSIS — Z79899 Other long term (current) drug therapy: Secondary | ICD-10-CM

## 2023-08-26 DIAGNOSIS — M109 Gout, unspecified: Secondary | ICD-10-CM | POA: Diagnosis present

## 2023-08-26 DIAGNOSIS — I272 Pulmonary hypertension, unspecified: Secondary | ICD-10-CM | POA: Diagnosis present

## 2023-08-26 DIAGNOSIS — M7989 Other specified soft tissue disorders: Secondary | ICD-10-CM | POA: Diagnosis present

## 2023-08-26 DIAGNOSIS — N400 Enlarged prostate without lower urinary tract symptoms: Secondary | ICD-10-CM | POA: Diagnosis not present

## 2023-08-26 DIAGNOSIS — I4819 Other persistent atrial fibrillation: Secondary | ICD-10-CM | POA: Diagnosis not present

## 2023-08-26 DIAGNOSIS — Z6841 Body Mass Index (BMI) 40.0 and over, adult: Secondary | ICD-10-CM

## 2023-08-26 DIAGNOSIS — F411 Generalized anxiety disorder: Secondary | ICD-10-CM | POA: Diagnosis present

## 2023-08-26 DIAGNOSIS — F32A Depression, unspecified: Secondary | ICD-10-CM | POA: Diagnosis not present

## 2023-08-26 DIAGNOSIS — E119 Type 2 diabetes mellitus without complications: Secondary | ICD-10-CM | POA: Diagnosis present

## 2023-08-26 DIAGNOSIS — N1832 Chronic kidney disease, stage 3b: Secondary | ICD-10-CM | POA: Diagnosis present

## 2023-08-26 DIAGNOSIS — Z7901 Long term (current) use of anticoagulants: Secondary | ICD-10-CM | POA: Diagnosis not present

## 2023-08-26 DIAGNOSIS — E1169 Type 2 diabetes mellitus with other specified complication: Secondary | ICD-10-CM | POA: Diagnosis present

## 2023-08-26 DIAGNOSIS — E1122 Type 2 diabetes mellitus with diabetic chronic kidney disease: Secondary | ICD-10-CM | POA: Diagnosis present

## 2023-08-26 DIAGNOSIS — N183 Chronic kidney disease, stage 3 unspecified: Secondary | ICD-10-CM | POA: Diagnosis present

## 2023-08-26 DIAGNOSIS — E669 Obesity, unspecified: Secondary | ICD-10-CM | POA: Diagnosis present

## 2023-08-26 DIAGNOSIS — Z791 Long term (current) use of non-steroidal anti-inflammatories (NSAID): Secondary | ICD-10-CM

## 2023-08-26 DIAGNOSIS — J449 Chronic obstructive pulmonary disease, unspecified: Secondary | ICD-10-CM | POA: Diagnosis present

## 2023-08-26 DIAGNOSIS — C61 Malignant neoplasm of prostate: Secondary | ICD-10-CM | POA: Diagnosis present

## 2023-08-26 DIAGNOSIS — G4733 Obstructive sleep apnea (adult) (pediatric): Secondary | ICD-10-CM

## 2023-08-26 DIAGNOSIS — Z888 Allergy status to other drugs, medicaments and biological substances status: Secondary | ICD-10-CM

## 2023-08-26 DIAGNOSIS — I509 Heart failure, unspecified: Principal | ICD-10-CM

## 2023-08-26 DIAGNOSIS — Z8546 Personal history of malignant neoplasm of prostate: Secondary | ICD-10-CM | POA: Diagnosis not present

## 2023-08-26 DIAGNOSIS — Z7984 Long term (current) use of oral hypoglycemic drugs: Secondary | ICD-10-CM | POA: Diagnosis not present

## 2023-08-26 HISTORY — DX: Heart failure, unspecified: I50.9

## 2023-08-26 LAB — CBC WITH DIFFERENTIAL/PLATELET
Abs Immature Granulocytes: 0.05 10*3/uL (ref 0.00–0.07)
Basophils Absolute: 0 10*3/uL (ref 0.0–0.1)
Basophils Relative: 1 %
Eosinophils Absolute: 0.2 10*3/uL (ref 0.0–0.5)
Eosinophils Relative: 3 %
HCT: 35.8 % — ABNORMAL LOW (ref 39.0–52.0)
Hemoglobin: 11.7 g/dL — ABNORMAL LOW (ref 13.0–17.0)
Immature Granulocytes: 1 %
Lymphocytes Relative: 16 %
Lymphs Abs: 1 10*3/uL (ref 0.7–4.0)
MCH: 30.4 pg (ref 26.0–34.0)
MCHC: 32.7 g/dL (ref 30.0–36.0)
MCV: 93 fL (ref 80.0–100.0)
Monocytes Absolute: 0.4 10*3/uL (ref 0.1–1.0)
Monocytes Relative: 7 %
Neutro Abs: 4.5 10*3/uL (ref 1.7–7.7)
Neutrophils Relative %: 72 %
Platelets: 154 10*3/uL (ref 150–400)
RBC: 3.85 MIL/uL — ABNORMAL LOW (ref 4.22–5.81)
RDW: 16.5 % — ABNORMAL HIGH (ref 11.5–15.5)
WBC: 6.1 10*3/uL (ref 4.0–10.5)
nRBC: 0 % (ref 0.0–0.2)

## 2023-08-26 LAB — MAGNESIUM: Magnesium: 2.3 mg/dL (ref 1.7–2.4)

## 2023-08-26 LAB — BASIC METABOLIC PANEL WITH GFR
Anion gap: 9 (ref 5–15)
BUN: 40 mg/dL — ABNORMAL HIGH (ref 8–23)
CO2: 27 mmol/L (ref 22–32)
Calcium: 10.6 mg/dL — ABNORMAL HIGH (ref 8.9–10.3)
Chloride: 98 mmol/L (ref 98–111)
Creatinine, Ser: 1.89 mg/dL — ABNORMAL HIGH (ref 0.61–1.24)
GFR, Estimated: 37 mL/min — ABNORMAL LOW (ref >60)
Glucose, Bld: 139 mg/dL — ABNORMAL HIGH (ref 70–99)
Potassium: 4.3 mmol/L (ref 3.5–5.1)
Sodium: 134 mmol/L — ABNORMAL LOW (ref 135–145)

## 2023-08-26 LAB — TROPONIN I (HIGH SENSITIVITY)
Troponin I (High Sensitivity): 19 ng/L — ABNORMAL HIGH (ref <18)
Troponin I (High Sensitivity): 17 ng/L (ref <18)

## 2023-08-26 LAB — HEPATIC FUNCTION PANEL
ALT: 24 U/L (ref 0–44)
AST: 24 U/L (ref 15–41)
Albumin: 4.4 g/dL (ref 3.5–5.0)
Alkaline Phosphatase: 86 U/L (ref 38–126)
Bilirubin, Direct: 0.2 mg/dL (ref 0.0–0.2)
Indirect Bilirubin: 0.5 mg/dL (ref 0.3–0.9)
Total Bilirubin: 0.7 mg/dL (ref 0.0–1.2)
Total Protein: 6.6 g/dL (ref 6.5–8.1)

## 2023-08-26 LAB — GLUCOSE, CAPILLARY
Glucose-Capillary: 112 mg/dL — ABNORMAL HIGH (ref 70–99)
Glucose-Capillary: 174 mg/dL — ABNORMAL HIGH (ref 70–99)

## 2023-08-26 LAB — BRAIN NATRIURETIC PEPTIDE: B Natriuretic Peptide: 164.9 pg/mL — ABNORMAL HIGH (ref 0.0–100.0)

## 2023-08-26 LAB — HEMOGLOBIN A1C
Hgb A1c MFr Bld: 5.7 % — ABNORMAL HIGH (ref 4.8–5.6)
Mean Plasma Glucose: 116.89 mg/dL

## 2023-08-26 LAB — ETHANOL: Alcohol, Ethyl (B): <10 mg/dL (ref <10)

## 2023-08-26 MED ORDER — ALLOPURINOL 100 MG PO TABS
100.0000 mg | ORAL_TABLET | Freq: Every day | ORAL | Status: DC
  Administered 2023-08-26 – 2023-08-31 (×6): 100 mg via ORAL
  Filled 2023-08-26 (×6): qty 1

## 2023-08-26 MED ORDER — ALBUTEROL SULFATE HFA 108 (90 BASE) MCG/ACT IN AERS: 2.0000 | INHALATION_SPRAY | Freq: Four times a day (QID) | RESPIRATORY_TRACT | Status: DC | PRN

## 2023-08-26 MED ORDER — ORAL CARE MOUTH RINSE: 15.0000 mL | OROMUCOSAL | Status: DC | PRN

## 2023-08-26 MED ORDER — PANTOPRAZOLE SODIUM 40 MG IV SOLR
40.0000 mg | Freq: Two times a day (BID) | INTRAVENOUS | Status: DC
  Administered 2023-08-26 – 2023-08-28 (×4): 40 mg via INTRAVENOUS
  Filled 2023-08-26 (×5): qty 10

## 2023-08-26 MED ORDER — APIXABAN 5 MG PO TABS
5.0000 mg | ORAL_TABLET | Freq: Two times a day (BID) | ORAL | Status: DC
  Administered 2023-08-26 – 2023-08-31 (×10): 5 mg via ORAL
  Filled 2023-08-26 (×10): qty 1

## 2023-08-26 MED ORDER — SODIUM CHLORIDE 0.9% FLUSH
3.0000 mL | Freq: Two times a day (BID) | INTRAVENOUS | Status: DC
  Administered 2023-08-26 – 2023-08-31 (×10): 3 mL via INTRAVENOUS

## 2023-08-26 MED ORDER — METOPROLOL SUCCINATE ER 25 MG PO TB24
12.5000 mg | ORAL_TABLET | Freq: Every day | ORAL | Status: DC
  Administered 2023-08-26 – 2023-08-27 (×2): 12.5 mg via ORAL
  Filled 2023-08-26 (×2): qty 1

## 2023-08-26 MED ORDER — TIOTROPIUM BROMIDE MONOHYDRATE 2.5 MCG/ACT IN AERS: 2.0000 | INHALATION_SPRAY | Freq: Every day | RESPIRATORY_TRACT | Status: DC

## 2023-08-26 MED ORDER — INSULIN ASPART 100 UNIT/ML IJ SOLN: 0.0000 [IU] | Freq: Every day | INTRAMUSCULAR | Status: DC

## 2023-08-26 MED ORDER — ONDANSETRON HCL 4 MG PO TABS: 4.0000 mg | ORAL_TABLET | Freq: Four times a day (QID) | ORAL | Status: DC | PRN

## 2023-08-26 MED ORDER — TAMSULOSIN HCL 0.4 MG PO CAPS
0.4000 mg | ORAL_CAPSULE | Freq: Every day | ORAL | Status: DC
  Administered 2023-08-26 – 2023-08-30 (×5): 0.4 mg via ORAL
  Filled 2023-08-26 (×5): qty 1

## 2023-08-26 MED ORDER — INSULIN ASPART 100 UNIT/ML IJ SOLN
0.0000 [IU] | Freq: Three times a day (TID) | INTRAMUSCULAR | Status: DC
  Administered 2023-08-28: 1 [IU] via SUBCUTANEOUS

## 2023-08-26 MED ORDER — ALPRAZOLAM 0.5 MG PO TABS
1.0000 mg | ORAL_TABLET | Freq: Every day | ORAL | Status: DC
Start: 2023-08-26 — End: 2023-08-27
  Administered 2023-08-26: 1 mg via ORAL
  Filled 2023-08-26: qty 2

## 2023-08-26 MED ORDER — THIAMINE HCL 100 MG/ML IJ SOLN
100.0000 mg | Freq: Every day | INTRAMUSCULAR | Status: DC
  Administered 2023-08-26 – 2023-08-28 (×3): 100 mg via INTRAVENOUS
  Filled 2023-08-26 (×4): qty 2

## 2023-08-26 MED ORDER — ACETAMINOPHEN 325 MG PO TABS
650.0000 mg | ORAL_TABLET | Freq: Four times a day (QID) | ORAL | Status: DC | PRN
  Administered 2023-08-26 – 2023-08-29 (×3): 650 mg via ORAL
  Filled 2023-08-26 (×3): qty 2

## 2023-08-26 MED ORDER — ACETAMINOPHEN 650 MG RE SUPP: 650.0000 mg | Freq: Four times a day (QID) | RECTAL | Status: DC | PRN

## 2023-08-26 MED ORDER — ATORVASTATIN CALCIUM 40 MG PO TABS
40.0000 mg | ORAL_TABLET | Freq: Every day | ORAL | Status: DC
  Administered 2023-08-26 – 2023-08-30 (×5): 40 mg via ORAL
  Filled 2023-08-26 (×5): qty 1

## 2023-08-26 MED ORDER — FUROSEMIDE 10 MG/ML IJ SOLN
40.0000 mg | Freq: Two times a day (BID) | INTRAMUSCULAR | Status: DC
  Administered 2023-08-26 – 2023-08-27 (×2): 40 mg via INTRAVENOUS
  Filled 2023-08-26 (×3): qty 4

## 2023-08-26 MED ORDER — HYDRALAZINE HCL 20 MG/ML IJ SOLN: 5.0000 mg | INTRAMUSCULAR | Status: DC | PRN

## 2023-08-26 MED ORDER — UMECLIDINIUM BROMIDE 62.5 MCG/ACT IN AEPB
1.0000 | INHALATION_SPRAY | Freq: Every day | RESPIRATORY_TRACT | Status: DC
  Administered 2023-08-26 – 2023-08-30 (×4): 1 via RESPIRATORY_TRACT
  Filled 2023-08-26: qty 7

## 2023-08-26 MED ORDER — FUROSEMIDE 10 MG/ML IJ SOLN
60.0000 mg | Freq: Once | INTRAMUSCULAR | Status: AC
  Administered 2023-08-26: 60 mg via INTRAVENOUS
  Filled 2023-08-26: qty 6

## 2023-08-26 MED ORDER — PREDNISOLONE ACETATE 1 % OP SUSP
1.0000 [drp] | Freq: Every day | OPHTHALMIC | Status: DC
  Administered 2023-08-26 – 2023-08-31 (×6): 1 [drp] via OPHTHALMIC
  Filled 2023-08-26: qty 5

## 2023-08-26 MED ORDER — ALBUTEROL SULFATE (2.5 MG/3ML) 0.083% IN NEBU: 2.5000 mg | INHALATION_SOLUTION | Freq: Four times a day (QID) | RESPIRATORY_TRACT | Status: DC | PRN

## 2023-08-26 MED ORDER — ONDANSETRON HCL 4 MG/2ML IJ SOLN: 4.0000 mg | Freq: Four times a day (QID) | INTRAMUSCULAR | Status: DC | PRN

## 2023-08-26 NOTE — Assessment & Plan Note (Signed)
 Continue patient on Eliquis, metoprolol.

## 2023-08-26 NOTE — ED Notes (Signed)
 [facility] at bedside

## 2023-08-26 NOTE — ED Triage Notes (Signed)
 Present with bilateral feet swelling over last couple of days. Having difficulty with walking.  Short of breath with exertion.

## 2023-08-26 NOTE — Assessment & Plan Note (Signed)
 Aspiration precaution, IV PPI.

## 2023-08-26 NOTE — Assessment & Plan Note (Signed)
 Vitals:   08/26/23 0828 08/26/23 1030 08/26/23 1130 08/26/23 1200  BP: (!) 119/51 (!) 118/59 (!) 129/59 (!) 156/67   08/26/23 1300 08/26/23 1425 08/26/23 1540 08/26/23 1800  BP: 132/62 (!) 125/56 138/68 124/61  To allow diuresis we will currently hold patient's Jardiance/Aldactone continue Lasix 40 IV twice daily. As of blood pressures are soft we are also going to hold patient's lisinopril 40 mg along with hydralazine. We will continue metoprolol 12.5 daily with patient's Lasix.

## 2023-08-26 NOTE — Assessment & Plan Note (Signed)
 Longstanding history of alcohol abuse patient drinks 6-8 beers on a daily basis last drink was about 2 days ago, no history of withdrawals or DTs or anxiety or shakes, will start patient on thiamine and continue monitoring with CIWA protocol.

## 2023-08-26 NOTE — Progress Notes (Addendum)
 Admit for CHF and and alcohol abuse.

## 2023-08-26 NOTE — ED Notes (Signed)
 Pt ambulated on RA 1/2 way around the Emergency room. He stated that he was a little short of breath. SATS 96% and HR 85. The Pt was pale when we got back to the room.

## 2023-08-26 NOTE — ED Notes (Signed)
 [facility] called for transport, pt bed assignment is ready

## 2023-08-26 NOTE — ED Provider Notes (Signed)
 [place] EMERGENCY DEPARTMENT AT [facility] Provider Note   CSN: [id number] Arrival date & time: 08/26/23  0815     History  Chief Complaint  Patient presents with   Foot Swelling    Francis Cardenas is a 73 y.o. male.  73 year old male with past medical history of alcohol abuse and CHF presenting to the emergency department today with lower extremity swelling and dyspnea on exertion.  The patient states he has been feeling unwell now for the past week or so with lower extremity swelling and some dyspnea with exertion.  [facility] at that [facility].  He is taking 2 tablets in the morning and 1 in the evening.  He was taking 1 in the morning 1 in the evening prior to this.  He tried to get in with his primary care doctor and they were unable to see him which is why he came to the ER for further evaluation.  He denies any associated chest pain.  Denies any hemoptysis.        Home Medications Prior to Admission medications   Medication Sig Start Date End Date Taking? Authorizing Provider  acetaminophen (TYLENOL) 325 MG tablet Take 2 tablets (650 mg total) by mouth every 6 (six) hours as needed. 09/27/21   Sloan Leiter, DO  albuterol (PROVENTIL HFA;VENTOLIN HFA) 108 (90 BASE) MCG/ACT inhaler Inhale 2 puffs into the lungs every 6 (six) hours as needed for wheezing.    [provider]  allopurinol (ZYLOPRIM) 100 MG tablet Take 100 mg by mouth daily. 05/07/22   [provider]  allopurinol (ZYLOPRIM) 300 MG tablet Take by mouth. 11/25/22   [provider]  ALPRAZolam Prudy Feeler) 1 MG tablet Take 1 tablet (1 mg total) by mouth at bedtime. 12/30/22   Zannie Cove, MD  amLODipine (NORVASC) 10 MG tablet Take 10 mg by mouth daily. 11/25/22   [provider]  apixaban (ELIQUIS) 5 MG TABS tablet Take 1 tablet (5 mg total) by mouth 2 (two) times daily. 02/28/22   Jodelle Red, MD  atorvastatin (LIPITOR)  80 MG tablet Take 40 mg by mouth daily with supper.    [provider]  carvedilol (COREG) 6.25 MG tablet Take 6.25 mg by mouth 2 (two) times daily with a meal. 04/08/22   [provider]  empagliflozin (JARDIANCE) 25 MG TABS tablet Take 1 tablet (25 mg total) by mouth daily. 01/01/23   Amin, Ankit C, MD  fluticasone-salmeterol (WIXELA INHUB) 250-50 MCG/ACT AEPB Inhale 1 puff into the lungs in the morning and at bedtime. Patient taking differently: Inhale 1 puff into the lungs daily. 09/05/22   Luciano Cutter, MD  gabapentin (NEURONTIN) 300 MG capsule Take 1 capsule (300 mg total) by mouth 2 (two) times daily. Patient taking differently: Take 300 mg by mouth 3 (three) times daily. 01/01/23   Amin, Ankit C, MD  guaifenesin (HUMIBID E) 400 MG TABS tablet Take 400 mg by mouth See admin instructions.  TAKE ONE TABLET BY MOUTH TWICE A DAY AS NEEDED FOR COUGH, CONGESTION FOR COUGH AND CONGESTION. FOR COUGH, CONGESTION 11/22/22   [provider]  lisinopril (ZESTRIL) 40 MG tablet Take 40 mg by mouth See admin instructions.  TAKE ONE TABLET BY MOUTH DAILY . HOLD DOSE FOR SYSTOLIC BLOOD PRESSURE LESS THAN 100. AVOID POTASSIUM SUPPLEMENTS OR ARTIFICIAL SALT. CAN INTERACT WITH SPIRONOLACTONE, SO WE WILL CHECK YOUR POTASSIUM LEVEL PERIODICALLY. 07/12/22   [provider]  Magnesium Oxide 420 MG TABS Take 420 mg by mouth daily with supper.    [provider]  meloxicam (MOBIC) 15 MG tablet Take 15 mg by mouth at bedtime.    [provider]  metoprolol succinate (TOPROL-XL) 25 MG 24 hr tablet Take 0.5 tablets (12.5 mg total) by mouth daily. 09/04/21   Lonia Blood, MD  Multiple Vitamins-Minerals (MULTIVITAMIN WITH MINERALS) tablet Take 1 tablet by mouth every Monday, Wednesday, and Friday. Patient not taking: Reported on 07/05/2023    [provider]  omeprazole (PRILOSEC) 20 MG capsule Take 20 mg by mouth daily with supper.    [provider]   potassium chloride SA (KLOR-CON M) 20 MEQ tablet Take 1 tablet (20 mEq total) by mouth daily with supper. 05/20/21   Lorin Glass, MD  prednisoLONE acetate (PRED FORTE) 1 % ophthalmic suspension Place 1 drop into both eyes daily. 07/10/23   [provider]  spironolactone (ALDACTONE) 25 MG tablet Take 25 mg by mouth every other day.    [provider]  tamsulosin (FLOMAX) 0.4 MG CAPS capsule Take 0.4 mg by mouth daily after supper.    [provider]  thiamine (VITAMIN B-1) 100 MG tablet Take 100 mg by mouth daily. Patient not taking: Reported on 08/04/2023    [provider]  Tiotropium Bromide Monohydrate (SPIRIVA RESPIMAT) 2.5 MCG/ACT AERS Inhale 2 puffs into the lungs daily. 09/05/22   Luciano Cutter, MD  Tiotropium Bromide-Olodaterol (STIOLTO RESPIMAT IN) Inhale into the lungs daily.    [provider]  torsemide (DEMADEX) 20 MG tablet Take 20 mg by mouth 2 (two) times daily. 10/21/21   [provider]  vitamin B-12 (CYANOCOBALAMIN) 500 MCG tablet Take 500 mcg by mouth daily with supper.    [provider]      Allergies    Oxcarbazepine and Zolpidem    Review of Systems   Review of Systems  Respiratory:  Positive for shortness of breath.   Cardiovascular:  Positive for leg swelling.  All other systems reviewed and are negative.   Physical Exam Updated Vital Signs BP (!) 118/59 (BP Location: Right Arm)   Pulse 62   Temp 98.1 F (36.7 C) (Oral)   Resp 18   Ht 5\' 10"  (1.778 m)   Wt (!) 142.9 kg   SpO2 95%   BMI 45.20 kg/m  Physical Exam Vitals and nursing note reviewed.   Gen: NAD Eyes: PERRL, EOMI HEENT: no oropharyngeal swelling Neck: trachea midline Resp: Diminished at bilateral lung bases Card: RRR, no murmurs, rubs, or gallops Abd: nontender, nondistended Extremities: no calf tenderness, 2+ edema bilaterally Vascular: 2+ radial pulses bilaterally, 2+ DP pulses bilaterally Skin: no rashes Psyc:  acting appropriately   ED Results / Procedures / Treatments   Labs (all labs ordered are listed, but only abnormal results are displayed) Labs Reviewed  BASIC METABOLIC PANEL WITH GFR - Abnormal; Notable for the following components:      Result Value   Sodium 134 (*)    Glucose, Bld 139 (*)    BUN 40 (*)    Creatinine, Ser 1.89 (*)    Calcium 10.6 (*)    GFR, Estimated 37 (*)    All other components within normal limits  CBC WITH DIFFERENTIAL/PLATELET - Abnormal; Notable for the following components:   RBC 3.85 (*)    Hemoglobin 11.7 (*)    HCT 35.8 (*)    RDW 16.5 (*)    All  other components within normal limits  BRAIN NATRIURETIC PEPTIDE - Abnormal; Notable for the following components:   B Natriuretic Peptide 164.9 (*)    All other components within normal limits    EKG EKG Interpretation Date/Time:  Saturday August 26 2023 08:29:28 EDT Ventricular Rate:  63 PR Interval:    QRS Duration:  109 QT Interval:  429 QTC Calculation: 440 R Axis:   74  Text Interpretation: Atrial fibrillation Low voltage, extremity and precordial leads Baseline wander in lead(s) V2 Confirmed by Beckey Downing [phone]) on 08/26/2023 8:31:36 AM  Radiology DG Chest Port 1 View Result Date: 08/26/2023 CLINICAL DATA:  Chest pain EXAM: PORTABLE CHEST 1 VIEW COMPARISON:  05/01/2023 FINDINGS: Heart and mediastinal contours are within normal limits. No focal opacities or effusions. No acute bony abnormality. IMPRESSION: No active disease. Electronically Signed   By: Charlett Nose M.D.   On: 08/26/2023 10:16    Procedures Procedures    Medications Ordered in ED Medications  furosemide (LASIX) injection 60 mg (has no administration in time range)    ED Course/ Medical Decision Making/ A&P                                 Medical Decision Making 73 year old male with past medical history of CHF and alcohol abuse presenting to the emergency department today with lower extremity swelling and dyspnea on  exertion.  I will further evaluate the patient here with basic labs as well as a chest x-ray to evaluate for CHF.  The patient is not requiring any oxygen here.  Will reevaluate for ultimate disposition.  The above workup will also evaluate for any renal dysfunction that may be contributing.  Given the bilateral nature no real pleuritic pain suspicion for pulmonary embolism is low at this time.  The patient's labs were assuring.  Chest x-ray was largely unremarkable.  On ambulation the patient did not have any desaturations but was very dyspneic and became pale appearing towards the end.  I did call discussed this with Dr. Tenny Craw from cardiology.  After some discussion given the patient's significant edema of the bilateral lower extremities and his symptoms with relatively minimal exertion he calls placed to hospitalist service for admission.  He is given 60 mg of Lasix here.  Amount and/or Complexity of Data Reviewed Labs: ordered. Radiology: ordered.  Risk Prescription drug management.           Final Clinical Impression(s) / ED Diagnoses Final diagnoses:  Acute on chronic congestive heart failure, unspecified heart failure type [facility])    Rx / DC Orders ED Discharge Orders     None         Durwin Glaze, MD 08/26/23 1128

## 2023-08-26 NOTE — Assessment & Plan Note (Signed)
 Strict I's and O's monitor patient's urine output continue patient with his Flomax.

## 2023-08-26 NOTE — Assessment & Plan Note (Signed)
 Will defer to PCP to consider antidepressants and Anxiolytic.  Patient is taking Xanax on a as needed basis.

## 2023-08-26 NOTE — Plan of Care (Signed)
  Problem: Health Behavior/Discharge Planning: Goal: Ability to manage health-related needs will improve Outcome: Progressing   Problem: Clinical Measurements: Goal: Will remain free from infection Outcome: Progressing Goal: Respiratory complications will improve Outcome: Progressing Goal: Cardiovascular complication will be avoided Outcome: Progressing   Problem: Activity: Goal: Risk for activity intolerance will decrease Outcome: Progressing   Problem: Nutrition: Goal: Adequate nutrition will be maintained Outcome: Progressing   Problem: Coping: Goal: Level of anxiety will decrease Outcome: Progressing

## 2023-08-26 NOTE — Assessment & Plan Note (Signed)
 CPAP per home settings.

## 2023-08-26 NOTE — Assessment & Plan Note (Signed)
 Carb consistent diet and Accu-Cheks.

## 2023-08-26 NOTE — Hospital Course (Signed)
 Marland Kitchen

## 2023-08-26 NOTE — Assessment & Plan Note (Signed)
 Nutritionist consult.

## 2023-08-26 NOTE — H&P (Signed)
 History and Physical    Patient: Francis Cardenas DOB: 27-May-1950 DOA: 08/26/2023 DOS: the patient was seen and examined on 08/26/2023 PCP: Benjiman Core, MD  Patient coming from:  [facility] Chief complaint: Chief Complaint  Patient presents with   Foot Swelling   HPI:  Francis Cardenas is a 73 y.o. male with past medical history  of allergy to oxcarbazepine and zolpidem,  chronic diastolic CHF, morbid obesity, heavy alcohol use, hypertension, GERD, OSA, paroxysmal A-fib, prostate cancer, type 2 diabetes mellitus, COPD, CKD 3 AA, PTSD, depression, anxiety, gout, chronic pain , noncompliance transferred from [facility] coming for leg swelling and shortness of breath.  [facility] and [facility] a cardiologist here in the [facility] health system.  At bedside patient is arguing with dietary to bring him a regular tray with cheese on the hamburger and a diet soda.  Patient is not happy with this heart healthy diet. Discussed patient's alcohol abuse and he states that he has gone long and not had any history of withdrawals ever when he stops drinking for a few days.  Patient also denies any history of DTs or shakes.  When asked about diabetes he states that he does not have diabetes but he thinks he does because he is on [place].  Patient's most recent echocardiogram was in August showing EF of 60-65%, reduced right ventricular systolic function and moderately enlarged ventricle size dilated left atrium, along with right atrium suspect patient has pulmonary hypertension.  And as expected patient has elevated pulmonary artery systolic pressure mildly on the echo. patient presents to the emergency room today at [facility] with lower extremity edema over the past few days also with shortness of breath.  At home patient's been taking the torsemide 20 mg twice daily,He is taking 2 tablets in the morning and 1 in the evening.   ED Course: Pt in ed at  bedside  is alert awake oriented.  Oxygenating 98% on room air. Vital signs in the ED were notable for the following:  Vitals:   08/26/23 1542 08/26/23 1554 08/26/23 1752 08/26/23 1800  BP:    124/61  Pulse:  62 68   Temp:  97.7 F (36.5 C)    Resp:      Height: 5\' 10"  (1.778 m)     Weight:  (!) 145.1 kg    SpO2:  99%    TempSrc:  Oral    BMI (Calculated):  45.9     >>ED evaluation thus far shows: Initial CMP shows sodium 134 glucose 139 BUN of 40 creatinine 1.89 calcium mildly elevated 10.6 normal LFTs. Initial BNP of 164.9, initial troponin of 19 and repeat of 17. CBC shows a normal white count hemoglobin of 7.7 platelets of 154. Chest x-ray done earlier today was negative for any acute findings or any active disease.  >>While in the ED patient received the following: Medications  furosemide (LASIX) injection 40 mg (has no administration in time range)  insulin aspart (novoLOG) injection 0-5 Units (has no administration in time range)  sodium chloride flush (NS) 0.9 % injection 3 mL (has no administration in time range)  acetaminophen (TYLENOL) tablet 650 mg (has no administration in time range)    Or  acetaminophen (TYLENOL) suppository 650 mg (has no administration in time range)  ondansetron (ZOFRAN) tablet 4 mg (has no administration in time range)    Or  ondansetron (ZOFRAN) injection 4 mg (has no administration  in time range)  hydrALAZINE (APRESOLINE) injection 5 mg (has no administration in time range)  insulin aspart (novoLOG) injection 0-6 Units (0 Units Subcutaneous Not Given 08/26/23 1725)  pantoprazole (PROTONIX) injection 40 mg (40 mg Intravenous Given 08/26/23 1751)  thiamine (VITAMIN B1) injection 100 mg (100 mg Intravenous Given 08/26/23 1753)  allopurinol (ZYLOPRIM) tablet 100 mg (100 mg Oral Given 08/26/23 1752)  ALPRAZolam (XANAX) tablet 1 mg (has no administration in time range)  apixaban (ELIQUIS) tablet 5 mg (has no administration in time range)  atorvastatin  (LIPITOR) tablet 40 mg (40 mg Oral Given 08/26/23 1752)  metoprolol succinate (TOPROL-XL) 24 hr tablet 12.5 mg (12.5 mg Oral Given 08/26/23 1752)  prednisoLONE acetate (PRED FORTE) 1 % ophthalmic suspension 1 drop (1 drop Both Eyes Given 08/26/23 1759)  tamsulosin (FLOMAX) capsule 0.4 mg (0.4 mg Oral Given 08/26/23 1751)  albuterol (PROVENTIL) (2.5 MG/3ML) 0.083% nebulizer solution 2.5 mg (has no administration in time range)  umeclidinium bromide (INCRUSE ELLIPTA) 62.5 MCG/ACT 1 puff (1 puff Inhalation Given 08/26/23 1750)  Oral care mouth rinse (has no administration in time range)  furosemide (LASIX) injection 60 mg (60 mg Intravenous Given 08/26/23 1204)   Review of Systems  Respiratory:  Positive for shortness of breath.   Cardiovascular:  Positive for leg swelling.  All other systems reviewed and are negative.  Past Medical History:  Diagnosis Date   Alcohol dependence (HCC)    BPH (benign prostatic hyperplasia)    Cancer (HCC)    prostate   CHF (congestive heart failure) (HCC)    COPD (chronic obstructive pulmonary disease) (HCC)    Diabetes mellitus without complication (HCC)    Dyspnea on exertion 07/14/2022   Elevated troponin 04/06/2019   Fall at home, initial encounter 09/14/2022   Hypercholesteremia    Hypertension    OSA on CPAP    Past Surgical History:  Procedure Laterality Date   ACHILLES TENDON REPAIR Left    CARDIAC CATHETERIZATION     CARPAL TUNNEL RELEASE Right    KNEE SURGERY Left    RIGHT/LEFT HEART CATH AND CORONARY ANGIOGRAPHY N/A 04/09/2019   Procedure: RIGHT/LEFT HEART CATH AND CORONARY ANGIOGRAPHY;  Surgeon: [place], Peter M, MD;  Location: [facility]ASIVE [facility];  Service: Cardiovascular;  Laterality: N/A;   TONSILLECTOMY     WRIST FRACTURE SURGERY Left     reports that he quit smoking about 45 years ago. His smoking use included cigarettes. He started smoking about 58 years ago. He has a 13 pack-year smoking history. He has been exposed to tobacco smoke. He has  never used smokeless tobacco. He reports current alcohol use of about 8.0 standard drinks of alcohol per week. He reports that he does not use drugs. Allergies  Allergen Reactions   Oxcarbazepine Other (See Comments)    Dizziness/ lightheaded   Zolpidem Nausea Only   Family History  Problem Relation Age of Onset   CAD Mother    Leukemia Mother    Heart attack Father    Heart attack Brother    Obesity Brother    Prior to Admission medications   Medication Sig Start Date End Date Taking? Authorizing Provider  acetaminophen (TYLENOL) 325 MG tablet Take 2 tablets (650 mg total) by mouth every 6 (six) hours as needed. 09/27/21   Sloan Leiter, DO  albuterol (PROVENTIL HFA;VENTOLIN HFA) 108 (90 BASE) MCG/ACT inhaler Inhale 2 puffs into the lungs every 6 (six) hours as needed for wheezing.    [provider]  allopurinol (ZYLOPRIM) 100 MG tablet Take 100 mg by mouth daily. 05/07/22   [provider]  allopurinol (ZYLOPRIM) 300 MG tablet Take by mouth. 11/25/22   [provider]  ALPRAZolam Prudy Feeler) 1 MG tablet Take 1 tablet (1 mg total) by mouth at bedtime. 12/30/22   Zannie Cove, MD  amLODipine (NORVASC) 10 MG tablet Take 10 mg by mouth daily. 11/25/22   [provider]  apixaban (ELIQUIS) 5 MG TABS tablet Take 1 tablet (5 mg total) by mouth 2 (two) times daily. 02/28/22   Jodelle Red, MD  atorvastatin (LIPITOR) 80 MG tablet Take 40 mg by mouth daily with supper.    [provider]  carvedilol (COREG) 6.25 MG tablet Take 6.25 mg by mouth 2 (two) times daily with a meal. 04/08/22   [provider]  empagliflozin (JARDIANCE) 25 MG TABS tablet Take 1 tablet (25 mg total) by mouth daily. 01/01/23   Amin, Ankit C, MD  fluticasone-salmeterol (WIXELA INHUB) 250-50 MCG/ACT AEPB Inhale 1 puff into the lungs in the morning and at bedtime. Patient taking differently: Inhale 1 puff into the lungs daily. 09/05/22   Luciano Cutter, MD  gabapentin  (NEURONTIN) 300 MG capsule Take 1 capsule (300 mg total) by mouth 2 (two) times daily. Patient taking differently: Take 300 mg by mouth 3 (three) times daily. 01/01/23   Amin, Ankit C, MD  guaifenesin (HUMIBID E) 400 MG TABS tablet Take 400 mg by mouth See admin instructions.  TAKE ONE TABLET BY MOUTH TWICE A DAY AS NEEDED FOR COUGH, CONGESTION FOR COUGH AND CONGESTION. FOR COUGH, CONGESTION 11/22/22   [provider]  lisinopril (ZESTRIL) 40 MG tablet Take 40 mg by mouth See admin instructions.  TAKE ONE TABLET BY MOUTH DAILY . HOLD DOSE FOR SYSTOLIC BLOOD PRESSURE LESS THAN 100. AVOID POTASSIUM SUPPLEMENTS OR ARTIFICIAL SALT. CAN INTERACT WITH SPIRONOLACTONE, SO WE WILL CHECK YOUR POTASSIUM LEVEL PERIODICALLY. 07/12/22   [provider]  Magnesium Oxide 420 MG TABS Take 420 mg by mouth daily with supper.    [provider]  meloxicam (MOBIC) 15 MG tablet Take 15 mg by mouth at bedtime.    [provider]  metoprolol succinate (TOPROL-XL) 25 MG 24 hr tablet Take 0.5 tablets (12.5 mg total) by mouth daily. 09/04/21   Lonia Blood, MD  Multiple Vitamins-Minerals (MULTIVITAMIN WITH MINERALS) tablet Take 1 tablet by mouth every Monday, Wednesday, and Friday. Patient not taking: Reported on 07/05/2023    [provider]  omeprazole (PRILOSEC) 20 MG capsule Take 20 mg by mouth daily with supper.    [provider]  potassium chloride SA (KLOR-CON M) 20 MEQ tablet Take 1 tablet (20 mEq total) by mouth daily with supper. 05/20/21   Lorin Glass, MD  prednisoLONE acetate (PRED FORTE) 1 % ophthalmic suspension Place 1 drop into both eyes daily. 07/10/23   [provider]  spironolactone (ALDACTONE) 25 MG tablet Take 25 mg by mouth every other day.    [provider]  tamsulosin (FLOMAX) 0.4 MG CAPS capsule Take 0.4 mg by mouth daily after supper.    [provider]  thiamine (VITAMIN B-1) 100 MG tablet Take 100 mg by mouth  daily. Patient not taking: Reported on 08/04/2023    [provider]  Tiotropium Bromide Monohydrate (SPIRIVA RESPIMAT) 2.5 MCG/ACT AERS Inhale 2 puffs into the lungs daily. 09/05/22   Luciano Cutter, MD  Tiotropium Bromide-Olodaterol (STIOLTO RESPIMAT IN) Inhale into the lungs daily.  [provider]  torsemide (DEMADEX) 20 MG tablet Take 20 mg by mouth 2 (two) times daily. 10/21/21   [provider]  vitamin B-12 (CYANOCOBALAMIN) 500 MCG tablet Take 500 mcg by mouth daily with supper.    [provider]                                                                                 Vitals:   08/26/23 1542 08/26/23 1554 08/26/23 1752 08/26/23 1800  BP:    124/61  Pulse:  62 68   Resp:      Temp:  97.7 F (36.5 C)    TempSrc:  Oral    SpO2:  99%    Weight:  (!) 145.1 kg    Height: 5\' 10"  (1.778 m)      Physical Exam Vitals and nursing note reviewed.  Constitutional:      General: He is not in acute distress.    Appearance: He is obese.  HENT:     Head: Normocephalic and atraumatic.     Right Ear: Hearing normal.     Left Ear: Hearing normal.     Nose: Nose normal. No nasal deformity.     Mouth/Throat:     Lips: Pink.     Tongue: No lesions.  Eyes:     General: Lids are normal.     Extraocular Movements: Extraocular movements intact.  Cardiovascular:     Rate and Rhythm: Normal rate and regular rhythm.     Heart sounds: Normal heart sounds.  Pulmonary:     Effort: Pulmonary effort is normal.     Breath sounds: Normal breath sounds.  Abdominal:     General: Abdomen is protuberant. Bowel sounds are normal. There is no distension.     Palpations: Abdomen is soft. There is no mass.     Tenderness: There is no abdominal tenderness.  Musculoskeletal:     Right lower leg: Edema present.     Left lower leg: Edema present.  Skin:    General: Skin is warm.  Neurological:     General: No focal deficit present.     Mental Status: He is  alert and oriented to person, place, and time.     Cranial Nerves: Cranial nerves 2-12 are intact.  Psychiatric:        Attention and Perception: Attention normal.        Mood and Affect: Mood normal.        Speech: Speech normal.        Behavior: Behavior normal. Behavior is cooperative.     Labs on Admission: I have personally reviewed following labs and imaging studies CBC: Recent Labs  Lab 08/26/23 0850  WBC 6.1  NEUTROABS 4.5  HGB 11.7*  HCT 35.8*  MCV 93.0  PLT 154   Basic Metabolic Panel: Recent Labs  Lab 08/26/23 0850 08/26/23 1656  NA 134*  --   K 4.3  --   CL 98  --   CO2 27  --   GLUCOSE 139*  --   BUN 40*  --   CREATININE 1.89*  --   CALCIUM 10.6*  --   MG  --  2.3   GFR: Estimated Creatinine Clearance: 49.4 mL/min (A) (by C-G formula based on SCr of 1.89 mg/dL (H)). Liver Function Tests: Recent Labs  Lab 08/26/23 0850  AST 24  ALT 24  ALKPHOS 86  BILITOT 0.7  PROT 6.6  ALBUMIN 4.4   No results for input(s): "LIPASE", "AMYLASE" in the last 168 hours. No results for input(s): "AMMONIA" in the last 168 hours. Coagulation Profile: No results for input(s): "INR", "PROTIME" in the last 168 hours. Cardiac Enzymes: No results for input(s): "CKTOTAL", "CKMB", "CKMBINDEX", "TROPONINI" in the last 168 hours. BNP (last 3 results) No results for input(s): "PROBNP" in the last 8760 hours. HbA1C: Recent Labs    08/26/23 1656  HGBA1C 5.7*   CBG: Recent Labs  Lab 08/26/23 1646  GLUCAP 112*   Lipid Profile: No results for input(s): "CHOL", "HDL", "LDLCALC", "TRIG", "CHOLHDL", "LDLDIRECT" in the last 72 hours. Thyroid Function Tests: No results for input(s): "TSH", "T4TOTAL", "FREET4", "T3FREE", "THYROIDAB" in the last 72 hours. Anemia Panel: No results for input(s): "VITAMINB12", "FOLATE", "FERRITIN", "TIBC", "IRON", "RETICCTPCT" in the last 72 hours. Urine analysis:    Component Value Date/Time   COLORURINE YELLOW 05/01/2023 1521    APPEARANCEUR CLEAR 05/01/2023 1521   LABSPEC 1.005 05/01/2023 1521   PHURINE 5.0 05/01/2023 1521   GLUCOSEU 50 (A) 05/01/2023 1521   HGBUR NEGATIVE 05/01/2023 1521   BILIRUBINUR NEGATIVE 05/01/2023 1521   KETONESUR NEGATIVE 05/01/2023 1521   PROTEINUR NEGATIVE 05/01/2023 1521   UROBILINOGEN 0.2 03/24/2013 1255   NITRITE NEGATIVE 05/01/2023 1521   LEUKOCYTESUR NEGATIVE 05/01/2023 1521   Radiological Exams on Admission: DG Chest Port 1 View Result Date: 08/26/2023 CLINICAL DATA:  Chest pain EXAM: PORTABLE CHEST 1 VIEW COMPARISON:  05/01/2023 FINDINGS: Heart and mediastinal contours are within normal limits. No focal opacities or effusions. No acute bony abnormality. IMPRESSION: No active disease. Electronically Signed   By: Charlett Nose M.D.   On: 08/26/2023 10:16   Data Reviewed: Relevant notes from primary care and specialist visits, past discharge summaries as available in EHR, including Care Everywhere. Prior diagnostic testing as pertinent to current admission diagnoses, Updated medications and problem lists for reconciliation ED course, including vitals, labs, imaging, treatment and response to treatment,Triage notes, nursing and pharmacy notes and ED provider's notes Notable results as noted in HPI.Discussed case with EDMD/ ED APP/ or Specialty MD on call and as needed. Assessment & Plan Acute on chronic diastolic CHF (congestive heart failure) (HCC) Echo as documented above, patient received Lasix in the emergency room which we will continue at 40 twice daily and monitor electrolytes kidney function.  Strict I's and O's, monitoring of potassium and mag . Cardiac diet with fluid restriction.  Sodium restriction.  Nutritionist consult. Benign essential hypertension Vitals:   08/26/23 0828 08/26/23 1030 08/26/23 1130 08/26/23 1200  BP: (!) 119/51 (!) 118/59 (!) 129/59 (!) 156/67   08/26/23 1300 08/26/23 1425 08/26/23 1540 08/26/23 1800  BP: 132/62 (!) 125/56 138/68 124/61  To allow  diuresis we will currently hold patient's Jardiance/Aldactone continue Lasix 40 IV twice daily. As of blood pressures are soft we are also going to hold patient's lisinopril 40 mg along with hydralazine. We will continue metoprolol 12.5 daily with patient's Lasix.  Type 2 diabetes mellitus with obesity (HCC) Carb consistent diet and Accu-Cheks. Persistent atrial fibrillation (HCC) Continue patient on Eliquis, metoprolol. OSA on CPAP CPAP per home settings. GERD (gastroesophageal reflux disease) Aspiration precaution, IV PPI.  BPH (benign prostatic hyperplasia) Strict I's and O's  monitor patient's urine output continue patient with his Flomax. Alcohol abuse Longstanding history of alcohol abuse patient drinks 6-8 beers on a daily basis last drink was about 2 days ago, no history of withdrawals or DTs or anxiety or shakes, will start patient on thiamine and continue monitoring with CIWA protocol. Morbid obesity with BMI of 45.0-49.9, adult [facility]) Nutritionist consult. Depressive disorder Will defer to PCP to consider antidepressants and Anxiolytic.  Patient is taking Xanax on a as needed basis.   DVT prophylaxis:  Eliquis.  Consults:  None  Advance Care Planning:    Code Status: Full Code   Family Communication:  None Disposition Plan:  TBD Severity of Illness: The appropriate patient status for this patient is OBSERVATION. Observation status is judged to be reasonable and necessary in order to provide the required intensity of service to ensure the patient's safety. The patient's presenting symptoms, physical exam findings, and initial radiographic and laboratory data in the context of their medical condition is felt to place them at decreased risk for further clinical deterioration. Furthermore, it is anticipated that the patient will be medically stable for discharge from the hospital within 2 midnights of admission.   Unresulted Labs (From admission, onward)     Start      Ordered   08/27/23 0500  Magnesium  Tomorrow morning,   R        08/26/23 1647            Orders Placed This Encounter  Procedures   DG Chest Port 1 View   Basic metabolic panel   CBC with Differential   Brain natriuretic peptide   Hepatic function panel   Hemoglobin A1c   Ethanol   Magnesium   Magnesium   Glucose, capillary   Diet Heart Room service appropriate? Yes; Fluid consistency: Thin; Fluid restriction: 1500 mL Fluid   Check Pulse Oximetry while ambulating   Cardiac Monitoring - Continuous Indefinite   Notify physician (specify)   Initiate Heart Failure Care Plan   Daily weights   Strict intake and output   In and Out Cath   Patient Education:   Apply Heart Failure Care Plan   [facility] and [facility] only) Obtain REDS clips reading Every morning   Apply Diabetes Mellitus Care Plan   STAT CBG when hypoglycemia is suspected. If treated, recheck every 15 minutes after each treatment until CBG >/= 70 mg/dl   Refer to Hypoglycemia Protocol Sidebar Report for treatment of CBG < 70 mg/dl   Patient has an active order for admit to inpatient/place in observation   Cardiac Monitoring Continuous x 24 hours Indications for use: Sub-acute heart failure   Maintain IV access   Vital signs   Notify physician (specify)   Mobility Protocol: No Restrictions RN to initiate protocols based on patient's level of care   Refer to Sidebar Report Refer to ICU, Med-Surg, Progressive, and Step-Down Mobility Protocol Sidebars   Initiate Adult Central Line Maintenance and Catheter Protocol for patients with central line (CVC, PICC, Port, Hemodialysis, Trialysis)   If patient diabetic or glucose greater than 140 notify physician for Sliding Scale Insulin Orders   Initiate Oral Care Protocol   Initiate Carrier Fluid Protocol   RN may order General Admission PRN Orders utilizing "General Admission PRN medications" (through manage orders) for the following patient needs: allergy symptoms (Claritin), cold  sores (Carmex), cough (Robitussin DM), eye irritation (Liquifilm Tears), hemorrhoids (Tucks), indigestion (Maalox), minor skin irritation (Hydrocortisone Cream), muscle pain (Ben Gay), nose irritation (  saline nasal spray) and sore throat (Chloraseptic spray).   Clinical institute withdrawal assessment   Complete oral care assessment tool on admission, transfer, and q shift   Refer to Sidebar Report Adult Oral Care Protocol   Brush teeth with toothbrush and toothpaste 3 times daily   Apply moisturizer in mouth and lips prn   Full code   Inpatient consult to Cardiology   Consult to hospitalist   Inpatient consult to Cardiology Consult Timeframe: ROUTINE - requires response within 24 hours; Reason for Consult? CHF Already called   Consult to Transition of Care Team   Consult to Heart Failure Navigation Team [facility] [place], and [facility])   Nutritional services consult   Consult to Registered Dietitian   OT eval and treat   PT eval and treat   Pulse oximetry check with vital signs   Oxygen therapy Mode or (Route): Nasal cannula; Liters Per Minute: 2; Keep O2 saturation between: greater than 92 %   Incentive spirometry   CPAP   EKG 12-Lead   ED EKG   EKG   EKG   ECHOCARDIOGRAM COMPLETE   Insert peripheral IV   Insert peripheral IV   Place in observation (patient's expected length of stay will be less than 2 midnights)    Author: Gertha Calkin, MD 12 pm -8 pm. 08/26/2023 6:47 PM Please note for questions,concerns,abnormal labs, or critical results past 8pm after hours please contact [place] floor coverage provider from 7 PM- 7 AM. For on call review [email or web address], username [email or web address] and PW: your phone number.

## 2023-08-26 NOTE — Assessment & Plan Note (Signed)
 Echo as documented above, patient received Lasix in the emergency room which we will continue at 40 twice daily and monitor electrolytes kidney function.  Strict I's and O's, monitoring of potassium and mag . Cardiac diet with fluid restriction.  Sodium restriction.  Nutritionist consult.

## 2023-08-26 NOTE — Progress Notes (Signed)
 Patient arrived via hospital transport from [facility] ED.  Patient stated, " I have cut back to 8-9 12 oz bottles of beer daily". Patient also reported that he does not bathe, ever.  His groin and scrotum are very red. Skin is red and cracked inner thighs.

## 2023-08-27 DIAGNOSIS — F1010 Alcohol abuse, uncomplicated: Secondary | ICD-10-CM | POA: Diagnosis present

## 2023-08-27 DIAGNOSIS — I4819 Other persistent atrial fibrillation: Secondary | ICD-10-CM | POA: Diagnosis present

## 2023-08-27 DIAGNOSIS — J449 Chronic obstructive pulmonary disease, unspecified: Secondary | ICD-10-CM | POA: Diagnosis present

## 2023-08-27 DIAGNOSIS — G4733 Obstructive sleep apnea (adult) (pediatric): Secondary | ICD-10-CM | POA: Diagnosis present

## 2023-08-27 DIAGNOSIS — E7800 Pure hypercholesterolemia, unspecified: Secondary | ICD-10-CM | POA: Diagnosis present

## 2023-08-27 DIAGNOSIS — M7989 Other specified soft tissue disorders: Secondary | ICD-10-CM | POA: Diagnosis present

## 2023-08-27 DIAGNOSIS — Z7984 Long term (current) use of oral hypoglycemic drugs: Secondary | ICD-10-CM | POA: Diagnosis not present

## 2023-08-27 DIAGNOSIS — C61 Malignant neoplasm of prostate: Secondary | ICD-10-CM | POA: Diagnosis present

## 2023-08-27 DIAGNOSIS — I130 Hypertensive heart and chronic kidney disease with heart failure and stage 1 through stage 4 chronic kidney disease, or unspecified chronic kidney disease: Secondary | ICD-10-CM | POA: Diagnosis present

## 2023-08-27 DIAGNOSIS — K219 Gastro-esophageal reflux disease without esophagitis: Secondary | ICD-10-CM | POA: Diagnosis present

## 2023-08-27 DIAGNOSIS — Z8249 Family history of ischemic heart disease and other diseases of the circulatory system: Secondary | ICD-10-CM | POA: Diagnosis not present

## 2023-08-27 DIAGNOSIS — E1122 Type 2 diabetes mellitus with diabetic chronic kidney disease: Secondary | ICD-10-CM | POA: Diagnosis present

## 2023-08-27 DIAGNOSIS — Z87891 Personal history of nicotine dependence: Secondary | ICD-10-CM | POA: Diagnosis not present

## 2023-08-27 DIAGNOSIS — F32A Depression, unspecified: Secondary | ICD-10-CM | POA: Diagnosis present

## 2023-08-27 DIAGNOSIS — M109 Gout, unspecified: Secondary | ICD-10-CM | POA: Diagnosis present

## 2023-08-27 DIAGNOSIS — E1142 Type 2 diabetes mellitus with diabetic polyneuropathy: Secondary | ICD-10-CM | POA: Diagnosis present

## 2023-08-27 DIAGNOSIS — I5033 Acute on chronic diastolic (congestive) heart failure: Secondary | ICD-10-CM | POA: Diagnosis present

## 2023-08-27 DIAGNOSIS — N1832 Chronic kidney disease, stage 3b: Secondary | ICD-10-CM | POA: Diagnosis present

## 2023-08-27 DIAGNOSIS — Z6841 Body Mass Index (BMI) 40.0 and over, adult: Secondary | ICD-10-CM | POA: Diagnosis not present

## 2023-08-27 DIAGNOSIS — Z7901 Long term (current) use of anticoagulants: Secondary | ICD-10-CM | POA: Diagnosis not present

## 2023-08-27 DIAGNOSIS — Z8546 Personal history of malignant neoplasm of prostate: Secondary | ICD-10-CM | POA: Diagnosis not present

## 2023-08-27 DIAGNOSIS — N400 Enlarged prostate without lower urinary tract symptoms: Secondary | ICD-10-CM | POA: Diagnosis present

## 2023-08-27 DIAGNOSIS — E66813 Obesity, class 3: Secondary | ICD-10-CM | POA: Diagnosis present

## 2023-08-27 DIAGNOSIS — I272 Pulmonary hypertension, unspecified: Secondary | ICD-10-CM | POA: Diagnosis present

## 2023-08-27 LAB — BASIC METABOLIC PANEL WITH GFR
Anion gap: 9 (ref 5–15)
BUN: 39 mg/dL — ABNORMAL HIGH (ref 8–23)
CO2: 27 mmol/L (ref 22–32)
Calcium: 10.5 mg/dL — ABNORMAL HIGH (ref 8.9–10.3)
Chloride: 101 mmol/L (ref 98–111)
Creatinine, Ser: 1.83 mg/dL — ABNORMAL HIGH (ref 0.61–1.24)
GFR, Estimated: 38 mL/min — ABNORMAL LOW
Glucose, Bld: 104 mg/dL — ABNORMAL HIGH (ref 70–99)
Potassium: 3.7 mmol/L (ref 3.5–5.1)
Sodium: 137 mmol/L (ref 135–145)

## 2023-08-27 LAB — GLUCOSE, CAPILLARY
Glucose-Capillary: 110 mg/dL — ABNORMAL HIGH (ref 70–99)
Glucose-Capillary: 129 mg/dL — ABNORMAL HIGH (ref 70–99)
Glucose-Capillary: 140 mg/dL — ABNORMAL HIGH (ref 70–99)
Glucose-Capillary: 118 mg/dL — ABNORMAL HIGH (ref 70–99)

## 2023-08-27 LAB — MAGNESIUM: Magnesium: 2.4 mg/dL (ref 1.7–2.4)

## 2023-08-27 MED ORDER — CARVEDILOL 6.25 MG PO TABS
6.2500 mg | ORAL_TABLET | Freq: Every day | ORAL | Status: DC
  Administered 2023-08-28 – 2023-08-31 (×4): 6.25 mg via ORAL
  Filled 2023-08-27 (×4): qty 1

## 2023-08-27 MED ORDER — EMPAGLIFLOZIN 25 MG PO TABS
25.0000 mg | ORAL_TABLET | Freq: Every day | ORAL | Status: DC
  Administered 2023-08-27 – 2023-08-31 (×5): 25 mg via ORAL
  Filled 2023-08-27 (×6): qty 1

## 2023-08-27 MED ORDER — POTASSIUM CHLORIDE CRYS ER 20 MEQ PO TBCR
20.0000 meq | EXTENDED_RELEASE_TABLET | Freq: Once | ORAL | Status: AC
  Administered 2023-08-27: 20 meq via ORAL
  Filled 2023-08-27: qty 1

## 2023-08-27 MED ORDER — FUROSEMIDE 10 MG/ML IJ SOLN
60.0000 mg | Freq: Two times a day (BID) | INTRAMUSCULAR | Status: DC
  Administered 2023-08-27 – 2023-08-29 (×5): 60 mg via INTRAVENOUS
  Filled 2023-08-27 (×5): qty 6

## 2023-08-27 MED ORDER — ADULT MULTIVITAMIN W/MINERALS CH
1.0000 | ORAL_TABLET | Freq: Every day | ORAL | Status: DC
  Administered 2023-08-28 – 2023-08-31 (×4): 1 via ORAL
  Filled 2023-08-27 (×5): qty 1

## 2023-08-27 MED ORDER — LORAZEPAM 1 MG PO TABS: 1.0000 mg | ORAL_TABLET | ORAL | Status: DC | PRN

## 2023-08-27 MED ORDER — HYDRALAZINE HCL 25 MG PO TABS
25.0000 mg | ORAL_TABLET | Freq: Every day | ORAL | Status: DC
  Administered 2023-08-28 – 2023-08-31 (×4): 25 mg via ORAL
  Filled 2023-08-27 (×4): qty 1

## 2023-08-27 MED ORDER — FOLIC ACID 1 MG PO TABS
1.0000 mg | ORAL_TABLET | Freq: Every day | ORAL | Status: DC
  Administered 2023-08-27 – 2023-08-31 (×5): 1 mg via ORAL
  Filled 2023-08-27 (×5): qty 1

## 2023-08-27 MED ORDER — LORAZEPAM 2 MG/ML IJ SOLN: 1.0000 mg | INTRAMUSCULAR | Status: DC | PRN

## 2023-08-27 MED ORDER — MOMETASONE FURO-FORMOTEROL FUM 200-5 MCG/ACT IN AERO
2.0000 | INHALATION_SPRAY | Freq: Two times a day (BID) | RESPIRATORY_TRACT | Status: DC
  Administered 2023-08-27 – 2023-08-31 (×7): 2 via RESPIRATORY_TRACT
  Filled 2023-08-27: qty 8.8

## 2023-08-27 NOTE — Plan of Care (Signed)
Problem: Health Behavior/Discharge Planning: Goal: Ability to manage health-related needs will improve Outcome: Progressing   Problem: Clinical Measurements: Goal: Will remain free from infection Outcome: Progressing   Problem: Nutrition: Goal: Adequate nutrition will be maintained Outcome: Progressing   Problem: Coping: Goal: Level of anxiety will decrease Outcome: Progressing   Problem: Elimination: Goal: Will not experience complications related to urinary retention Outcome: Progressing

## 2023-08-27 NOTE — Progress Notes (Signed)
 Initial Nutrition Assessment  DOCUMENTATION CODES:   Morbid obesity  INTERVENTION:   Order double protein with meals.   Diet education in discharge instructions.   Continue MVI with minerals.   NUTRITION DIAGNOSIS:   Increased nutrient needs related to chronic illness as evidenced by estimated needs.  GOAL:   Patient will meet greater than or equal to 90% of their needs  MONITOR:   PO intake, Supplement acceptance, Weight trends  REASON FOR ASSESSMENT:   Consult Assessment of nutrition requirement/status, Diet education  ASSESSMENT:   PMH of chronic diastolic CHF, morbid obesity, heavy alcohol use, hypertension, GERD, OSA, paroxysmal A-fib, prostate cancer, type 2 diabetes mellitus, COPD, CKD 3 AA, PTSD, depression, anxiety, gout, chronic pain , noncompliance transferred from [facility] for leg swelling and shortness of breath. Adm for benign essential hypertension.  RD attempted to call patient, but pt did not answer. Chart notes reveal pt requesting burger and fries but not compliant with heart healthy diet. Nursing document meal completion x1 100%. Chart notes reveal pt drinks 8-10 12oz beers daily. Pt receiving MVI, thiamine and folic acid. Will discuss diet education at f/u.   Medications reviewed and include: folic acid, lasix, SSI 0-5 units with meals and at bedtime, MVI, protonix, flomax, thiamine,   Labs reviewed: Sodium 134 L, BUN 40 H, Creatinine 1.89 H, A1c 5.7%   Intake/Output Summary (Last 24 hours) at 08/27/2023 0951 Last data filed at 08/27/2023 0700 Gross per 24 hour  Intake 480 ml  Output 3350 ml  Net -2870 ml    Weights reviewed. Admit weight 145.1 kg. Wt loss likely due to fluid shifts. Pt is on diuretics.    NUTRITION - FOCUSED PHYSICAL EXAM:  Defer to f/u.  Diet Order:   Diet Order             Diet Heart Room service appropriate? Yes; Fluid consistency: Thin; Fluid restriction: 1500 mL Fluid  Diet effective now                    EDUCATION NEEDS:   Not appropriate for education at this time  Skin:  Skin Assessment: Reviewed RN Assessment  Last BM:  4/4  Height:   Ht Readings from Last 1 Encounters:  08/26/23 5\' 10"  (1.778 m)    Weight:   Wt Readings from Last 1 Encounters:  08/27/23 (!) 140.5 kg    Ideal Body Weight:  75 kg  BMI:  Body mass index is 44.44 kg/m.  Estimated Nutritional Needs:   Kcal:  1600-1950  Protein:  100-115  Fluid:  per MD  Kathrynn Speed, MPH, RD, LDN Clinical Dietitian Contact information can be found at [facility].

## 2023-08-27 NOTE — Evaluation (Signed)
 Physical Therapy Brief Evaluation and Discharge Note Patient Details Name: Francis Cardenas MRN: [id number] DOB: 11-Dec-1950 Today's Date: 08/27/2023   History of Present Illness  This 73 yo male admitted to [facility] on 08/26/23 with LE swelling and SOB. Pt being worked up for benign essential HTN and CHF. PMH includes:  COPD, benign essential HTN, DM, chest pain syndrome, CHF, OSA, morbid obesity, CKD stage III, ETOH abuse, s/p achilles tendon repair, s/p wrist fx surgery  Clinical Impression  Pt very verbose, and speaks about past and actual home set up and activity level concurrently, but likely PTA pt living with wife in single story home with ramped entrance. Pt reports modified independence with mobility using Rollator, independent with ADLs, wife assists with iADLs. Pt is currently limited in safe mobility by increased DoE however SpO2 on RA >90%O2 throughout session. Pt is supervision for transfers, and ambulation and contact guard for one step. Pt has no further PT needs, will refer to Mobility Specialist to increased activity tolerance         PT Assessment Patient does not need any further PT services  Assistance Needed at Discharge  PRN    Equipment Recommendations None recommended by PT  Recommendations for Other Services       Precautions/Restrictions Precautions Precautions: Fall Recall of Precautions/Restrictions: Intact Precaution/Restrictions Comments: verbose, needs a lot of redirection Restrictions Weight Bearing Restrictions Per Provider Order: No        Mobility  Bed Mobility       General bed mobility comments: up in recliner, sleeps in recliner at home  Transfers Overall transfer level: Needs assistance Equipment used: None Transfers: Sit to/from Stand Sit to Stand: Supervision           General transfer comment: Supervision for safety, Pt did not need DME to perform stand -used Rollator for gait    Ambulation/Gait Ambulation/Gait assistance:  Supervision Gait Distance (Feet): 250 Feet Assistive device: Rollator (4 wheels) Gait Pattern/deviations: WFL(Within Functional Limits), Step-through pattern Gait Speed: Pace WFL General Gait Details: supervision for safety, slowed, generally steady gait, poor awareness of safety for seated rest break with Rollator provided education  Home Activity Instructions Home Activity Instructions: activity hourly when awake  Stairs Stairs: Yes Stairs assistance: Contact guard assist Stair Management: One rail Right, Forwards, Backwards Number of Stairs: 1 General stair comments: had pt step up and down 1 step increased effort but able to complete with contact guard assist     Balance   Sitting-balance support: No upper extremity supported, Feet supported Sitting balance-Leahy Scale: Good Sitting balance - Comments: supported sitting in recliner     Standing balance-Leahy Scale: Fair Standing balance comment: static standing no AD          Pertinent Vitals/Pain   Pain Assessment Pain Assessment: Faces Faces Pain Scale: No hurt     Home Living Family/patient expects to be discharged to:: Private residence Living Arrangements: Spouse/significant other Available Help at Discharge: Available PRN/intermittently Home Environment: Ramped entrance;Stairs to enter  Stairs-Number of Steps: 1 Home Equipment: Rollator (4 wheels);Shower seat;Grab bars - tub/shower;Electric scooter   Additional Comments: sleeps in a recliner, a fall 3wks ago    Prior Function Level of Independence: Independent with assistive device(s)      UE/LE Assessment   UE ROM/Strength/Tone/Coordination: Generalized weakness    LE ROM/Strength/Tone/Coordination: Generalized weakness      Communication   Communication Communication: No apparent difficulties     Cognition Overall Cognitive Status: Impaired Comments: decreased awareness of  medical condition     General Comments General comments (skin  integrity, edema, etc.): Sp02 <97% on RA with activity. cues for pt to reduce talking to catch his breath.     Assessment/Plan       PT Visit Diagnosis Muscle weakness (generalized) (M62.81)    No Skilled PT Patient is supervision for all activity/mobility;Patient at baseline level of functioning;All education completed;Patient will have necessary level of assist by caregiver at discharge    AMPAC 6 Clicks Help needed turning from your back to your side while in a flat bed without using bedrails?: A Lot Help needed moving from lying on your back to sitting on the side of a flat bed without using bedrails?: A Lot Help needed moving to and from a bed to a chair (including a wheelchair)?: A Little Help needed standing up from a chair using your arms (e.g., wheelchair or bedside chair)?: A Little Help needed to walk in hospital room?: A Little Help needed climbing 3-5 steps with a railing? : A Little 6 Click Score: 16      End of Session Equipment Utilized During Treatment: Gait belt Activity Tolerance: Patient tolerated treatment well Patient left: in chair Nurse Communication: Mobility status PT Visit Diagnosis: Muscle weakness (generalized) (M62.81)     Time: 1610-9604 PT Time Calculation (min) (ACUTE ONLY): 17 min  Charges:   PT Evaluation $PT Eval Low Complexity: 1 Low      Francis Cardenas B. Francis Cardenas PT, DPT Acute Rehabilitation Services Please use secure chat or  Call Office [phone]   Francis Cardenas  08/27/2023, 11:39 AM

## 2023-08-27 NOTE — Discharge Instructions (Signed)
 Heart Healthy, Consistent Carbohydrate Nutrition Therapy   A heart-healthy and consistent carbohydrate diet is recommended to manage heart disease and diabetes. To follow a heart-healthy and consistent carbohydrate diet, Eat a balanced diet with whole grains, fruits and vegetables, and lean protein sources.  Choose heart-healthy unsaturated fats. Limit saturated fats, trans fats, and cholesterol intake. Eat more plant-based or vegetarian meals using beans and soy foods for protein.  Eat whole, unprocessed foods to limit the amount of sodium (salt) you eat.  Choose a consistent amount of carbohydrate at each meal and snack. Limit refined carbohydrates especially sugar, sweets and sugar-sweetened beverages.  If you drink alcohol, do so in moderation: one serving per day (women) and two servings per day (men). o One serving is equivalent to 12 ounces beer, 5 ounces wine, or 1.5 ounces distilled spirits  Tips Tips for Choosing Heart-Healthy Fats Choose lean protein and low-fat dairy foods to reduce saturated fat intake. Saturated fat is usually found in animal-based protein and is associated with certain health risks. Saturated fat is the biggest contributor to raise low-density lipoprotein (LDL) cholesterol levels. Research shows that limiting saturated fat lowers unhealthy cholesterol levels. Eat no more than 7% of your total calories each day from saturated fat. Ask your RDN to help you determine how much saturated fat is right for you. There are many foods that do not contain large amounts of saturated fats. Swapping these foods to replace foods high in saturated fats will help you limit the saturated fat you eat and improve your cholesterol levels. You can also try eating more plant-based or vegetarian meals. Instead of. Try:  Whole milk, cheese, yogurt, and ice cream 1% or skim milk, low-fat cheese, non-fat yogurt, and low-fat ice cream  Fatty, marbled beef and pork Lean beef, pork, or venison   Poultry with skin Poultry without skin  Butter, stick margarine Reduced-fat, whipped, or liquid spreads  Coconut oil, palm oil Liquid vegetable oils: corn, canola, olive, soybean and safflower oils   Avoid foods that contain trans fats. Trans fats increase levels of LDL-cholesterol. Hydrogenated fat in processed foods is the main source of trans fats in foods.  Trans fats can be found in stick margarine, shortening, processed sweets, baked goods, some fried foods, and packaged foods made with hydrogenated oils. Avoid foods with "partially hydrogenated oil" on the ingredient list such as: cookies, pastries, baked goods, biscuits, crackers, microwave popcorn, and frozen dinners. Choose foods with heart healthy fats. Polyunsaturated and monounsaturated fat are unsaturated fats that may help lower your blood cholesterol level when used in place of saturated fat in your diet. Ask your RDN about taking a dietary supplement with plant sterols and stanols to help lower your cholesterol level. Research shows that substituting saturated fats with unsaturated fats is beneficial to cholesterol levels. Try these easy swaps: Instead of. Try:  Butter, stick margarine, or solid shortening Reduced-fat, whipped, or liquid spreads  Beef, pork, or poultry with skin Fish and seafood  Chips, crackers, snack foods Raw or unsalted nuts and seeds or nut butters Hummus with vegetables Avocado on toast  Coconut oil, palm oil Liquid vegetable oils: corn, canola, olive, soybean and safflower oils  Limit the amount of cholesterol you eat to less than 200 milligrams per day. Cholesterol is a substance carried through the bloodstream via lipoproteins, which are known as "transporters" of fat. Some body functions need cholesterol to work properly, but too much cholesterol in the bloodstream can damage arteries and build up blood vessel linings (  which can lead to heart attack and stroke). You should eat less than 200 milligrams  cholesterol per day. People respond differently to eating cholesterol. There is no test available right now that can figure out which people will respond more to dietary cholesterol and which will respond less. For individuals with high intake of dietary cholesterol, different types of increase (none, small, moderate, large) in LDL-cholesterol levels are all possible.  Food sources of cholesterol include egg yolks and organ meats such as liver, gizzards. Limit egg yolks to two to four per week and avoid organ meats like liver and gizzards to control cholesterol intake. Tips for Choosing Heart-Healthy Carbohydrates Consume a consistent amount of carbohydrate It is important to eat foods with carbohydrates in moderation because they impact your blood glucose level. Carbohydrates can be found in many foods such as: Grains (breads, crackers, rice, pasta, and cereals)  Starchy Vegetables (potatoes, corn, and peas)  Beans and legumes  Milk, soy milk, and yogurt  Fruit and fruit juice  Sweets (cakes, cookies, ice cream, jam and jelly) Your RDN will help you set a goal for how many carbohydrate servings to eat at your meals and snacks. For many adults, eating 3 to 5 servings of carbohydrate foods at each meal and 1 or 2 carbohydrate servings for each snack works well.  Check your blood glucose level regularly. It can tell you if you need to adjust when you eat carbohydrates. Choose foods rich in viscous (soluble) fiber Viscous, or soluble, is found in the walls of plant cells. Viscous fiber is found only in plant-based foods. Eating foods with fiber helps to lower your unhealthy cholesterol and keep your blood glucose in range  Rich sources of viscous fiber include vegetables (asparagus, Brussels sprouts, sweet potatoes, turnips) fruit (apricots, mangoes, oranges), legumes, and whole grains (barley, oats, and oat bran).  As you increase your fiber intake gradually, also increase the amount of water you  drink. This will help prevent constipation.  If you have difficulty achieving this goal, ask your RDN about fiber laxatives. Choose fiber supplements made with viscous fibers such as psyllium seed husks or methylcellulose to help lower unhealthy cholesterol.  Limit refined carbohydrates  There are three types of carbohydrates: starches, sugar, and fiber. Some carbohydrates occur naturally in food, like the starches in rice or corn or the sugars in fruits and milk. Refined carbohydrates--foods with high amounts of simple sugars--can raise triglyceride levels. High triglyceride levels are associated with coronary heart disease. Some examples of refined carbohydrate foods are table sugar, sweets, and beverages sweetened with added sugar. Tips for Reducing Sodium (Salt) Although sodium is important for your body to function, too much sodium can be harmful for people with high blood pressure. As sodium and fluid buildup in your tissues and bloodstream, your blood pressure increases. High blood pressure may cause damage to other organs and increase your risk for a stroke. Even if you take a pill for blood pressure or a water pill (diuretic) to remove fluid, it is still important to have less salt in your diet. Ask your doctor and RDN what amount of sodium is right for you. Avoid processed foods. Eat more fresh foods.  Fresh fruits and vegetables are naturally low in sodium, as well as frozen vegetables and fruits that have no added juices or sauces.  Fresh meats are lower in sodium than processed meats, such as bacon, sausage, and hotdogs. Read the nutrition label or ask your butcher to help you find a  fresh meat that is low in sodium. Eat less salt--at the table and when cooking.  A single teaspoon of table salt has 2,300 mg of sodium.  Leave the salt out of recipes for pasta, casseroles, and soups.  Ask your RDN how to cook your favorite recipes without sodium Be a smart shopper.  Look for food packages  that say "salt-free" or "sodium-free." These items contain less than 5 milligrams of sodium per serving.  "Very low-sodium" products contain less than 35 milligrams of sodium per serving.  "Low-sodium" products contain less than 140 milligrams of sodium per serving.  Beware for "Unsalted" or "No Added Salt" products. These items may still be high in sodium. Check the nutrition label. Add flavors to your food without adding sodium.  Try lemon juice, lime juice, fruit juice or vinegar.  Dry or fresh herbs add flavor. Try basil, bay leaf, dill, rosemary, parsley, sage, dry mustard, nutmeg, thyme, and paprika.  Pepper, red pepper flakes, and cayenne pepper can add spice t your meals without adding sodium. Hot sauce contains sodium, but if you use just a drop or two, it will not add up to much.  Buy a sodium-free seasoning blend or make your own at home. Additional Lifestyle Tips Achieve and maintain a healthy weight. Talk with your RDN or your doctor about what is a healthy weight for you. Set goals to reach and maintain that weight.  To lose weight, reduce your calorie intake along with increasing your physical activity. A weight loss of 10 to 15 pounds could reduce LDL-cholesterol by 5 milligrams per deciliter. Participate in physical activity. Talk with your health care team to find out what types of physical activity are best for you. Set a plan to get about 30 minutes of exercise on most days.  Foods Recommended Food Group Foods Recommended  Grains Whole grain breads and cereals, including whole wheat, barley, rye, buckwheat, corn, teff, quinoa, millet, amaranth, brown or wild rice, sorghum, and oats Pasta, especially whole wheat or other whole grain types  [employer] [place] [employer], quinoa or wild rice Whole grain crackers, bread, rolls, pitas Home-made bread with reduced-sodium baking soda  Protein Foods Lean cuts of beef and pork (loin, leg, round, extra lean hamburger)  Skinless Press photographer  and other wild game Dried beans and peas Nuts and nut butters Meat alternatives made with soy or textured vegetable protein  Egg whites or egg substitute Cold cuts made with lean meat or soy protein  Dairy Nonfat (skim), low-fat, or 1%-fat milk  Nonfat or low-fat yogurt or cottage cheese Fat-free and low-fat cheese  Vegetables Fresh, frozen, or canned vegetables without added fat or salt   Fruits Fresh, frozen, canned, or dried fruit   Oils Unsaturated oils (corn, olive, peanut, soy, sunflower, canola)  Soft or liquid margarines and vegetable oil spreads  Salad dressings Seeds and nuts  Avocado   Foods Not Recommended Food Group Foods Not Recommended  Grains Breads or crackers topped with salt Cereals (hot or cold) with more than 300 mg sodium per serving Biscuits, cornbread, and other "quick" breads prepared with baking soda Bread crumbs or stuffing mix from a store High-fat bakery products, such as doughnuts, biscuits, croissants, danish pastries, pies, cookies Instant cooking foods to which you add hot water and stir--potatoes, noodles, rice, etc. Packaged starchy foods--seasoned noodle or rice dishes, stuffing mix, macaroni and cheese dinner Snacks made with partially hydrogenated oils, including chips, cheese puffs, snack mixes, regular crackers, butter-flavored popcorn  Protein Foods  Higher-fat cuts of meats (ribs, t-bone steak, regular hamburger) Bacon, sausage, or hot dogs Cold cuts, such as salami or bologna, deli meats, cured meats, corned beef Organ meats (liver, brains, gizzards, sweetbreads) Poultry with skin Fried or smoked meat, poultry, and fish Whole eggs and egg yolks (more than 2-4 per week) Salted legumes, nuts, seeds, or nut/seed butters Meat alternatives with high levels of sodium (>300 mg per serving) or saturated fat (>5 g per serving)  Dairy Whole milk,?2% fat milk, buttermilk Whole milk yogurt or ice cream Cream Half-&-half Cream cheese Sour  cream Cheese  Vegetables Canned or frozen vegetables with salt, fresh vegetables prepared with salt, butter, cheese, or cream sauce Fried vegetables Pickled vegetables such as olives, pickles, or sauerkraut  Fruits Fried fruits Fruits served with butter or cream  Oils Butter, stick margarine, shortening Partially hydrogenated oils or trans fats Tropical oils (coconut, palm, palm kernel oils)  Other Candy, sugar sweetened soft drinks and desserts Salt, sea salt, garlic salt, and seasoning mixes containing salt Bouillon cubes Ketchup, barbecue sauce, Worcestershire sauce, soy sauce, teriyaki sauce Miso Salsa Pickles, olives, relish   Heart Healthy Consistent Carbohydrate Vegetarian (Lacto-Ovo) Sample 1-Day Menu  Breakfast 1 cup oatmeal, cooked (2 carbohydrate servings)   cup blueberries (1 carbohydrate serving)  11 almonds, without salt  1 cup 1% milk (1 carbohydrate serving)  1 cup coffee  Morning Snack 1 cup fat-free plain yogurt (1 carbohydrate serving)  Lunch 1 whole wheat bun (1 carbohydrate servings)  1 black bean burger (1 carbohydrate servings)  1 slice cheddar cheese, low sodium  2 slices tomatoes  2 leaves lettuce  1 teaspoon mustard  1 small pear (1 carbohydrate servings)  1 cup green tea, unsweetened  Afternoon Snack 1/3 cup trail mix with nuts, seeds, and raisins, without salt (1 carbohydrate servinga)  Evening Meal  cup meatless chicken  2/3 cup brown rice, cooked (2 carbohydrate servings)  1 cup broccoli, cooked (2/3 carbohydrate serving)   cup carrots, cooked (1/3 carbohydrate serving)  2 teaspoons olive oil  1 teaspoon balsamic vinegar  1 whole wheat dinner roll (1 carbohydrate serving)  1 teaspoon margarine, soft, tub  1 cup 1% milk (1 carbohydrate serving)  Evening Snack 1 extra small banana (1 carbohydrate serving)  1 tablespoon peanut butter   Heart Healthy Consistent Carbohydrate Vegan Sample 1-Day Menu  Breakfast 1 cup oatmeal, cooked (2  carbohydrate servings)   cup blueberries (1 carbohydrate serving)  11 almonds, without salt  1 cup soymilk fortified with calcium, vitamin B12, and vitamin D  1 cup coffee  Morning Snack 6 ounces soy yogurt (1 carbohydrate servings)  Lunch 1 whole wheat bun(1 carbohydrate servings)  1 black bean burger (1 carbohydrate serving)  2 slices tomatoes  2 leaves lettuce  1 teaspoon mustard  1 small pear (1 carbohydrate servings)  1 cup green tea, unsweetened  Afternoon Snack 1/3 cup trail mix with nuts, seeds, and raisins, without salt (1 carbohydrate servings)  Evening Meal  cup meatless chicken  2/3 cup brown rice, cooked (2 carbohydrate servings)  1 cup broccoli, cooked (2/3 carbohydrate serving)   cup carrots, cooked (1/3 carbohydrate serving)  2 teaspoons olive oil  1 teaspoon balsamic vinegar  1 whole wheat dinner roll (1 carbohydrate serving)  1 teaspoon margarine, soft, tub  1 cup soymilk fortified with calcium, vitamin B12, and vitamin D  Evening Snack 1 extra small banana (1 carbohydrate serving)  1 tablespoon peanut butter    Heart Healthy Consistent Carbohydrate Sample  1-Day Menu  Breakfast 1 cup cooked oatmeal (2 carbohydrate servings)  3/4 cup blueberries (1 carbohydrate serving)  1 ounce almonds  1 cup skim milk (1 carbohydrate serving)  1 cup coffee  Morning Snack 1 cup sugar-free nonfat yogurt (1 carbohydrate serving)  Lunch 2 slices whole-wheat bread (2 carbohydrate servings)  2 ounces lean [place] breast  1 ounce low-fat Swiss cheese  1 teaspoon mustard  1 slice tomato  1 lettuce leaf  1 small pear (1 carbohydrate serving)  1 cup skim milk (1 carbohydrate serving)  Afternoon Snack 1 ounce trail mix with unsalted nuts, seeds, and raisins (1 carbohydrate serving)  Evening Meal 3 ounces salmon  2/3 cup cooked brown rice (2 carbohydrate servings)  1 teaspoon soft margarine  1 cup cooked broccoli with 1/2 cup cooked carrots (1 carbohydrate serving  Carrots,  cooked, boiled, drained, without salt  1 cup lettuce  1 teaspoon olive oil with vinegar for dressing  1 small whole grain roll (1 carbohydrate serving)  1 teaspoon soft margarine  1 cup unsweetened tea  Evening Snack 1 extra-small banana (1 carbohydrate serving)  Copyright 2020  [employer]. All rights reserved.

## 2023-08-27 NOTE — Progress Notes (Signed)
   08/27/23 0022  BiPAP/CPAP/SIPAP  $ Non-Invasive Ventilator  Non-Invasive Vent Set Up  $ Non-Invasive Home Ventilator  Initial  $ Face Mask Large  Yes  BiPAP/CPAP/SIPAP Pt Type Adult  BiPAP/CPAP/SIPAP DREAMSTATIOND  Mask Type Full face mask  Dentures removed? Not applicable  Mask Size Large  EPAP 14 cmH2O (settings set per patient stating home settings is 14cmh2o. patient states pressure is comfortable)  Patient Home Machine No  Patient Home Mask No  Patient Home Tubing No  Auto Titrate No  Device Plugged into RED Power Outlet Yes    Patient is on CPAP at this time. Settings were adjusted/set per patient stating home settings were 14cm2ho. Patient states pressure is comfortable. No complications noted. RN aware  Everlean Alstrom L. Katrinka Blazing, BS, RRT-ACCS, RCP

## 2023-08-27 NOTE — Progress Notes (Signed)
  Progress Note   Patient: Francis Cardenas:[id number] DOB: [birth year] DOA: 08/26/2023     0 DOS: the patient was seen and examined on 08/27/2023 at 1:05PM      Brief hospital course: 73 y.o. M with MO, dCHF, EtOH 8-9 beers daily, no prior withdrawals, OSA, pAF on ELiquis, CKD IIIb baseline 1.8, DM, gout, PTSD, and ProsCA who presented with few weeks of leg swelling and SOB with exertion.    Recently seen in PCP's office and his torsemide was doubled, but his symptoms didn't improve so he came to the ER.      Assessment and Plan: * Acute on chronic diastolic CHF (congestive heart failure) (HCC) Net -2.8 L yesterday, weight down 2 kg.  Creatinine and potassium stable He failed outpatient therapy, and also still appears severely dyspneic working with therapy, and dyspneic just with talking while at rest - Continue IV Lasix, increase to 60 mg IV twice daily -Daily BMP - Daily weights, strict ins and outs -Continue home beta-blocker, Jardiance  Alcohol use disorder Reports that his never had alcohol withdrawals of any kind.  Reports that he drinks 8-9 beers per day, starting in the morning. -Thiamine and folate -CIWA scoring - On-demand lorazepam  Persistent atrial fibrillation (HCC) -Continue apixaban - Continue metoprolol  Morbid obesity with BMI of 45.0-49.9, adult (HCC) Class III obesity, complicates care  OSA on CPAP -CPAP  Type 2 diabetes mellitus with obesity (HCC) Diabetic polyneuropathy -Continue sliding scale corrections - Hold home gabapentin - Continue Lipitor  Chronic kidney disease stage IIIb Creatinine stable relative to baseline  Benign essential hypertension BP normal - Hold amlodipine - Continue furosemide, hydralazine, beta-blocker  Mood disorder -Hold home alprazolam -Lorazepam as needed  Gout -Continue allopurinol  BPH - Continue Flomax  COPD No evidence of flare - Continue ICS/LABA/LAMA        Subjective: Patient still quite  dyspneic.  No change in his leg swelling.     Physical Exam: BP 131/67 (BP Location: Left Arm)   Pulse 62   Temp 97.8 F (36.6 C) (Oral)   Resp 17   Ht 5\' 10"  (1.778 m)   Wt (!) 140.5 kg   SpO2 97%   BMI 44.44 kg/m   Obese adult male, sitting up in recliner, appears short of breath at rest RRR, no murmurs, JVP not visible, pitting in the lower extremities bilaterally Respiratory effort seems increased, lung sounds diminished Abdomen large, precludes exam Attention normal, affect appropriate, judgment and insight appear normal, face symmetric, speech fluent, generalized weakness but symmetric strength    Data Reviewed: Basic metabolic panel shows creatinine stable at 1.8, potassium stable at 4.3 Echocardiogram from last year shows EF 60 to 65%, elevated PASP Magnesium normal BNP 164  Family Communication: None present    Disposition: Status is: Inpatient The patient was admitted with congestive heart failure  He remains quite dyspneic, even at rest, or with minimal exertion, and this is a failure of outpatient management  We will continue IV Lasix        Author: Alberteen Sam, MD 08/27/2023 6:29 PM  For on call review [email or web address].

## 2023-08-27 NOTE — Plan of Care (Signed)
   Problem: Education: Goal: Knowledge of General Education information will improve Description Including pain rating scale, medication(s)/side effects and non-pharmacologic comfort measures Outcome: Progressing   Problem: Clinical Measurements: Goal: Will remain free from infection Outcome: Progressing  Goal: Diagnostic test results will improve Outcome: Progressing Goal: Respiratory complications will improve Outcome: Progressing Goal: Cardiovascular complication will be avoided Outcome: Progressing   Problem: Activity: Goal: Risk for activity intolerance will decrease Outcome: Progressing

## 2023-08-27 NOTE — Evaluation (Signed)
 Occupational Therapy Evaluation and DC Summary Patient Details Name: Francis Cardenas MRN: [id number] DOB: 08/02/1950 Today's Date: 08/27/2023   History of Present Illness   This 73 yo male admitted to [facility] on 08/26/23 with LE swelling and SOB. Pt being worked up for benign essential HTN and CHF. PMH includes:  COPD, benign essential HTN, DM, chest pain syndrome, CHF, OSA, morbid obesity, CKD stage III, ETOH abuse, s/p achilles tendon repair, s/p wrist fx surgery     Clinical Impressions Pt admitted for above, PTA pt was ambulating Mod I using rollator and per notes does not bathe but overall ind with dressing, his wife completes household iADLs. Pt currently presenting close to baseline, ambulatory with Rollator + Supervision for safety, educated pt on safe use of Rollator at home. Pt VSS on RA, provided education for pt to allow breathing to return to normal function before conversing. Pt has no further acute skilled OT needs, would benefit from working with mobility specialist team. No post acute OT recommended.      If plan is discharge home, recommend the following:   Assistance with cooking/housework     Functional Status Assessment   Patient has not had a recent decline in their functional status     Equipment Recommendations   None recommended by OT     Recommendations for Other Services         Precautions/Restrictions   Precautions Precautions: Fall Recall of Precautions/Restrictions: Intact Precaution/Restrictions Comments: verbose, needs a lot of redirection Restrictions Weight Bearing Restrictions Per Provider Order: No     Mobility Bed Mobility               General bed mobility comments: pt up in recliner on arrival    Transfers Overall transfer level: Needs assistance Equipment used: None Transfers: Sit to/from Stand Sit to Stand: Supervision           General transfer comment: Supervision for safety, Pt did not need DME to perform  stand -used Rollator for gait      Balance Overall balance assessment: Needs assistance Sitting-balance support: No upper extremity supported, Feet supported Sitting balance-Leahy Scale: Good Sitting balance - Comments: supported sitting in recliner     Standing balance-Leahy Scale: Fair Standing balance comment: static standing no AD                           ADL either performed or assessed with clinical judgement   ADL Overall ADL's : Needs assistance/impaired Eating/Feeding: Independent;Sitting   Grooming: Standing;Supervision/safety   Upper Body Bathing: Standing;Supervision/ safety     Lower Body Bathing Details (indicate cue type and reason): Does not bathe?? Upper Body Dressing : Set up;Sitting   Lower Body Dressing: Supervision/safety;Sitting/lateral leans;Sit to/from stand Lower Body Dressing Details (indicate cue type and reason): wears slip on shoes, demonstrated ability to doff/don pants Toilet Transfer: Supervision/safety;Rollator (4 wheels);Ambulation   Toileting- Clothing Manipulation and Hygiene: Sit to/from stand;Set up       Functional mobility during ADLs: Supervision/safety;Rollator (4 wheels) General ADL Comments: Educated pt to park RW on even surfaces for safety and best to use it against supported surfaces if needed to take a seated rest break     Vision         Perception         Praxis         Pertinent Vitals/Pain Pain Assessment Pain Assessment: Faces Faces Pain Scale: No hurt  Extremity/Trunk Assessment Upper Extremity Assessment Upper Extremity Assessment: Overall WFL for tasks assessed   Lower Extremity Assessment Lower Extremity Assessment: Defer to PT evaluation       Communication Communication Communication: No apparent difficulties   Cognition Arousal: Alert Behavior During Therapy: WFL for tasks assessed/performed Cognition: No apparent impairments             OT - Cognition Comments:  tangenital                 Following commands: Intact       Cueing  General Comments   Cueing Techniques: Verbal cues  Sp02 <97% on RA with activity. cues for pt to reduce talking to catch his breath.   Exercises     Shoulder Instructions      Home Living Family/patient expects to be discharged to:: Private residence Living Arrangements: Spouse/significant other Available Help at Discharge: Family;Available PRN/intermittently (wife may to shopping or etc) Type of Home: House Home Access: Ramped entrance;Other (comment) (4" step to his deck)     Home Layout: One level     Bathroom Shower/Tub: Producer, television/film/video: Handicapped height Bathroom Accessibility: No   Home Equipment: Rollator (4 wheels);Shower seat;Grab bars - tub/shower;Electric scooter   Additional Comments: sleeps in a recliner, a fall 3wks ago      Prior Functioning/Environment Prior Level of Function : Independent/Modified Independent;History of Falls (last six months)             Mobility Comments: Mod I rollator ADLs Comments: spouse does cooking/cleaning. Per notes pt does not bathe    OT Problem List: Obesity   OT Treatment/Interventions:        OT Goals(Current goals can be found in the care plan section)   Acute Rehab OT Goals OT Goal Formulation: All assessment and education complete, DC therapy Time For Goal Achievement: 09/10/23 Potential to Achieve Goals: Good   OT Frequency:       Co-evaluation              AM-PAC OT "6 Clicks" Daily Activity     Outcome Measure Help from another person eating meals?: None Help from another person taking care of personal grooming?: A Little Help from another person toileting, which includes using toliet, bedpan, or urinal?: A Little Help from another person bathing (including washing, rinsing, drying)?: A Little Help from another person to put on and taking off regular upper body clothing?: A Little Help from  another person to put on and taking off regular lower body clothing?: A Little 6 Click Score: 19   End of Session Equipment Utilized During Treatment: Gait belt;Rollator (4 wheels) Nurse Communication: Mobility status  Activity Tolerance: Patient tolerated treatment well Patient left: in chair;with call bell/phone within reach  OT Visit Diagnosis: Other (comment) (SOB)                Time: 1610-9604 OT Time Calculation (min): 17 min Charges:  OT General Charges $OT Visit: 1 Visit OT Evaluation $OT Eval Low Complexity: 1 Low  08/27/2023  AB, OTR/L  Acute Rehabilitation Services  Office: [phone]   Tristan Schroeder 08/27/2023, 10:33 AM

## 2023-08-27 NOTE — Plan of Care (Signed)
  Problem: Education: Goal: Knowledge of General Education information will improve Description: Including pain rating scale, medication(s)/side effects and non-pharmacologic comfort measures Outcome: Progressing   Problem: Activity: Goal: Risk for activity intolerance will decrease Outcome: Progressing   Problem: Clinical Measurements: Goal: Diagnostic test results will improve Outcome: Progressing   Problem: Nutrition: Goal: Adequate nutrition will be maintained Outcome: Progressing   Problem: Coping: Goal: Level of anxiety will decrease Outcome: Progressing

## 2023-08-28 DIAGNOSIS — I5033 Acute on chronic diastolic (congestive) heart failure: Secondary | ICD-10-CM | POA: Diagnosis not present

## 2023-08-28 LAB — BASIC METABOLIC PANEL WITH GFR
Anion gap: 10 (ref 5–15)
BUN: 40 mg/dL — ABNORMAL HIGH (ref 8–23)
CO2: 24 mmol/L (ref 22–32)
Calcium: 10.7 mg/dL — ABNORMAL HIGH (ref 8.9–10.3)
Chloride: 103 mmol/L (ref 98–111)
Creatinine, Ser: 1.72 mg/dL — ABNORMAL HIGH (ref 0.61–1.24)
GFR, Estimated: 41 mL/min — ABNORMAL LOW (ref >60)
Glucose, Bld: 124 mg/dL — ABNORMAL HIGH (ref 70–99)
Potassium: 3.9 mmol/L (ref 3.5–5.1)
Sodium: 137 mmol/L (ref 135–145)

## 2023-08-28 LAB — GLUCOSE, CAPILLARY
Glucose-Capillary: 116 mg/dL — ABNORMAL HIGH (ref 70–99)
Glucose-Capillary: 151 mg/dL — ABNORMAL HIGH (ref 70–99)
Glucose-Capillary: 135 mg/dL — ABNORMAL HIGH (ref 70–99)
Glucose-Capillary: 143 mg/dL — ABNORMAL HIGH (ref 70–99)

## 2023-08-28 LAB — CBC
HCT: 36.7 % — ABNORMAL LOW (ref 39.0–52.0)
Hemoglobin: 11.8 g/dL — ABNORMAL LOW (ref 13.0–17.0)
MCH: 30 pg (ref 26.0–34.0)
MCHC: 32.2 g/dL (ref 30.0–36.0)
MCV: 93.4 fL (ref 80.0–100.0)
Platelets: 151 10*3/uL (ref 150–400)
RBC: 3.93 MIL/uL — ABNORMAL LOW (ref 4.22–5.81)
RDW: 16.3 % — ABNORMAL HIGH (ref 11.5–15.5)
WBC: 5.6 10*3/uL (ref 4.0–10.5)
nRBC: 0 % (ref 0.0–0.2)

## 2023-08-28 MED ORDER — PANTOPRAZOLE SODIUM 40 MG PO TBEC
40.0000 mg | DELAYED_RELEASE_TABLET | Freq: Two times a day (BID) | ORAL | Status: DC
  Administered 2023-08-28 – 2023-08-31 (×6): 40 mg via ORAL
  Filled 2023-08-28 (×2): qty 1
  Filled 2023-08-28: qty 2
  Filled 2023-08-28 (×3): qty 1

## 2023-08-28 MED ORDER — THIAMINE MONONITRATE 100 MG PO TABS
100.0000 mg | ORAL_TABLET | Freq: Every day | ORAL | Status: DC
  Administered 2023-08-29 – 2023-08-31 (×3): 100 mg via ORAL
  Filled 2023-08-28 (×3): qty 1

## 2023-08-28 MED ORDER — BISACODYL 5 MG PO TBEC: 10.0000 mg | DELAYED_RELEASE_TABLET | Freq: Every day | ORAL | Status: DC | PRN

## 2023-08-28 NOTE — Assessment & Plan Note (Signed)
 Diabetic polyneuropathy -Continue sliding scale corrections - Hold home gabapentin - Continue Lipitor

## 2023-08-28 NOTE — Plan of Care (Signed)
  Problem: Education: Goal: Knowledge of General Education information will improve Description: Including pain rating scale, medication(s)/side effects and non-pharmacologic comfort measures 08/28/2023 1826 by Elnita Maxwell, RN Outcome: Progressing 08/28/2023 1644 by Elnita Maxwell, RN Outcome: Progressing   Problem: Health Behavior/Discharge Planning: Goal: Ability to manage health-related needs will improve 08/28/2023 1826 by Elnita Maxwell, RN Outcome: Progressing 08/28/2023 1644 by Elnita Maxwell, RN Outcome: Progressing   Problem: Clinical Measurements: Goal: Ability to maintain clinical measurements within normal limits will improve 08/28/2023 1826 by Elnita Maxwell, RN Outcome: Progressing 08/28/2023 1644 by Elnita Maxwell, RN Outcome: Progressing   Problem: Clinical Measurements: Goal: Will remain free from infection 08/28/2023 1826 by Elnita Maxwell, RN Outcome: Progressing 08/28/2023 1644 by Elnita Maxwell, RN Outcome: Progressing   Problem: Clinical Measurements: Goal: Diagnostic test results will improve 08/28/2023 1826 by Elnita Maxwell, RN Outcome: Progressing 08/28/2023 1644 by Elnita Maxwell, RN Outcome: Progressing   Problem: Clinical Measurements: Goal: Respiratory complications will improve 08/28/2023 1826 by Elnita Maxwell, RN Outcome: Progressing 08/28/2023 1644 by Elnita Maxwell, RN Outcome: Progressing

## 2023-08-28 NOTE — Assessment & Plan Note (Signed)
 On Xanax at home.

## 2023-08-28 NOTE — Assessment & Plan Note (Signed)
 BP trending up - Continue furosemide, hydralazine, beta-blocker - Hold amlodipine, may resume soon

## 2023-08-28 NOTE — Assessment & Plan Note (Signed)
-   Continue apixaban, metoprolol

## 2023-08-28 NOTE — Progress Notes (Signed)
  Progress Note   Patient: Francis Cardenas:[id number] DOB: 1950-11-17 DOA: 08/26/2023     1 DOS: the patient was seen and examined on 08/28/2023 at 9:12AM      Brief hospital course: 73 y.o. M with MO, dCHF, EtOH 8-9 beers daily, no prior withdrawals, OSA, pAF on ELiquis, CKD IIIb baseline 1.8, DM, gout, PTSD, and ProsCA who presented with few weeks of leg swelling and SOB with exertion.     Recently seen in PCP's office and his torsemide was doubled, but his symptoms didn't improve so he came to the ER.     Assessment and Plan: * Acute on chronic diastolic CHF (congestive heart failure) (HCC) Net -2 L yesterday, weights unchanged.  Creatinine and potassium stable  - Continue IV Lasix 60 BID - Daily BMP - Daily weights, strict ins and outs - Continue home beta-blocker, Jardiance  CKD (chronic kidney disease), stage IIIb (HCC) Cr stable relative to baseline  BPH (benign prostatic hyperplasia) - Continue Flomax  Persistent atrial fibrillation (HCC) - Continue apixaban, metoprolol  Morbid obesity with BMI of 45.0-49.9, adult (HCC) Class III obesity, complicates care  OSA on CPAP - Continue CPAP  Type 2 diabetes mellitus with obesity (HCC) Diabetic polyneuropathy -Continue sliding scale corrections - Hold home gabapentin - Continue Lipitor  GERD (gastroesophageal reflux disease)     Benign essential hypertension BP normal - Hold amlodipine - Continue furosemide, hydralazine, beta-blocker  Depressive disorder - Hold Xanax - Lorazepam as needed  Alcohol use disorder Reports that his never had alcohol withdrawals of any kind.  Reports that he drinks 8-9 beers per day, starting in the morning. -Thiamine and folate -CIWA scoring - On-demand lorazepam          Subjective: Patient is making a large amount of urine.  His breathing is slightly better.  His  is unchanged.     Physical Exam: BP (!) 124/56 (BP Location: Right Arm)   Pulse 62   Temp 97.8  F (36.6 C) (Oral)   Resp 18   Ht 5\' 10"  (1.778 m)   Wt (!) 140.6 kg   SpO2 93%   BMI 44.48 kg/m   adult male, sitting up in recliner, interactive and appropriate RRR, no murmurs, JVP not visible, slightly improved pitting edema in lower extremities bilaterally Respiratory effort more comfortable at rest, lung sounds diminished, rales at bilateral bases Attention normal, affect appropriate, judgment and insight appear normal, face symmetric, speech fluent, generalized weakness but symmetric strength    Data Reviewed: Basic metabolic panel shows creatinine slightly down to 1.7, potassium stable at 3.9 CBC shows hemoglobin 11.8, stable  Family Communication: None present    Disposition: Status is: Inpatient 73 yo M with CHF, failed outpatient management.  Will continue IV Lasix, I suspect that he has several more days of diuresis to go        Author: Alberteen Sam, MD 08/28/2023 3:49 PM  For on call review [email or web address].

## 2023-08-28 NOTE — Assessment & Plan Note (Signed)
 Marland Kitchen

## 2023-08-28 NOTE — Assessment & Plan Note (Signed)
 -  Continue Flomax

## 2023-08-28 NOTE — Progress Notes (Signed)
PHARMACIST - PHYSICIAN COMMUNICATION   CONCERNING: IV to Oral Route Change Policy  RECOMMENDATION: This patient is receiving protonix by the intravenous route.  Based on criteria approved by the Pharmacy and Therapeutics Committee, the intravenous medication(s) is/are being converted to the equivalent oral dose form(s).   DESCRIPTION: These criteria include:  The patient is eating (either orally or via tube) and/or has been taking other orally administered medications for a least 24 hours  The patient has no evidence of active gastrointestinal bleeding or impaired GI absorption (gastrectomy, short bowel, patient on TNA or NPO).  If you have questions about this conversion, please contact the Pharmacy Department  []  ( [phone] )  [place] []  ( [phone] )  [facility] [x]  ( [phone] )  [place] []  ( [phone] )  [facility] []  ( [phone] )  [facility]    Andrew Meyer, PharmD Clinical Pharmacist **Pharmacist phone directory can now be found on [email or web address] ([email or web address]).  Listed under [facility].     

## 2023-08-28 NOTE — Assessment & Plan Note (Signed)
 Continue CPAP.

## 2023-08-28 NOTE — Progress Notes (Signed)
 Mobility Specialist Progress Note:   08/28/23 1100  Mobility  Activity Ambulated with assistance in hallway  Level of Assistance Standby assist, set-up cues, supervision of patient - no hands on  Assistive Device Four wheel walker  Distance Ambulated (ft) 290 ft  Activity Response Tolerated well  Mobility Referral Yes  Mobility visit 1 Mobility  Mobility Specialist Start Time (ACUTE ONLY) 1100  Mobility Specialist Stop Time (ACUTE ONLY) 1120  Mobility Specialist Time Calculation (min) (ACUTE ONLY) 20 min   Pt agreeable to mobility session. Required no physical assistance throughout ambulation with rollator. SpO2 97% on RA. Back in chair with all needs met.   Addison Lank Mobility Specialist Please contact via SecureChat or  Rehab office at [phone]

## 2023-08-28 NOTE — Assessment & Plan Note (Signed)
 Class III obesity, complicates care

## 2023-08-28 NOTE — Assessment & Plan Note (Signed)
 Reports that his never had alcohol withdrawals of any kind.  Reports that he drinks 8-9 beers per day, starting in the morning. -Thiamine and folate -CIWA scoring - On-demand lorazepam

## 2023-08-28 NOTE — Plan of Care (Signed)

## 2023-08-28 NOTE — Assessment & Plan Note (Signed)
 Creatinine actually trending down from baseline 1.7

## 2023-08-28 NOTE — TOC CM/SW Note (Signed)
 Transition of Care [facility]) - Inpatient Brief Assessment   Patient Details  Name: Francis Cardenas MRN: [id number] Date of Birth: 11-30-1950  [facility] of [facility]) CM/SW Contact:    Michaela Corner, LCSWA Phone Number: 08/28/2023, 12:24 PM   Clinical Narrative: CSW met patient at bedside and completed substance use consult. Patient accepted substance use resources. Patient discussed his life with CSW and stated that he use to be in the [employer]. He has an estranged dtr and lives with his current wife.   TOC will continue to follow.    Transition of Care Asessment: Insurance and Status: Insurance coverage has been reviewed Patient has primary care physician: Yes Home environment has been reviewed: Home with spouse Prior level of function:: Independent Prior/Current Home Services: No current home services Social Drivers of Health Review: SDOH reviewed no interventions necessary Readmission risk has been reviewed: Yes Transition of care needs: no transition of care needs at this time

## 2023-08-28 NOTE — Progress Notes (Signed)
 Nutrition Follow-up  DOCUMENTATION CODES:   Morbid obesity  INTERVENTION:   Continue double protein with meals.    Provided diet education materials.    Continue MVI with minerals.   NUTRITION DIAGNOSIS:   Increased nutrient needs related to chronic illness as evidenced by estimated needs. *continues  GOAL:   Patient will meet greater than or equal to 90% of their needs *meeting  MONITOR:   PO intake, Supplement acceptance, Weight trends  REASON FOR ASSESSMENT:   Consult Assessment of nutrition requirement/status, Diet education  ASSESSMENT:   PMH of chronic diastolic CHF, morbid obesity, heavy alcohol use, hypertension, GERD, OSA, paroxysmal A-fib, prostate cancer, type 2 diabetes mellitus, COPD, CKD 3 AA, PTSD, depression, anxiety, gout, chronic pain , noncompliance transferred from [facility] coming for leg swelling and shortness of breath. Adm for benign essential hypertension.  RD met with pt at bedside. RD provided diet education to pt and left materials at bedside. Reviewed patient's dietary recall. Provided examples on ways to decrease sodium intake in diet. Discouraged intake of processed foods and use of salt shaker. Encouraged fresh fruits and vegetables as well as whole grain sources of carbohydrates to maximize fiber intake.   NUTRITION - FOCUSED PHYSICAL EXAM:  Flowsheet Row Most Recent Value  Orbital Region Mild depletion  Upper Arm Region Mild depletion  Thoracic and Lumbar Region No depletion  Buccal Region Mild depletion  Temple Region No depletion  Clavicle Bone Region No depletion  Clavicle and Acromion Bone Region No depletion  Scapular Bone Region No depletion  Dorsal Hand No depletion  Patellar Region No depletion  Anterior Thigh Region No depletion  Posterior Calf Region No depletion  Edema (RD Assessment) Moderate  Hair Reviewed  Eyes Reviewed  Mouth Reviewed  Skin Reviewed  Nails Reviewed       Diet Order:   Diet Order              Diet Heart Room service appropriate? Yes; Fluid consistency: Thin; Fluid restriction: 1500 mL Fluid  Diet effective now                   EDUCATION NEEDS:   Not appropriate for education at this time  Skin:  Skin Assessment: Reviewed RN Assessment  Last BM:  4/4  Height:   Ht Readings from Last 1 Encounters:  08/26/23 5\' 10"  (1.778 m)    Weight:   Wt Readings from Last 1 Encounters:  08/28/23 (!) 140.6 kg    Ideal Body Weight:  75 kg  BMI:  Body mass index is 44.48 kg/m.  Estimated Nutritional Needs:   Kcal:  1600-1950  Protein:  100-115  Fluid:  per MD  Kathrynn Speed, MPH, RD, LDN Clinical Dietitian Contact information can be found at [facility].

## 2023-08-28 NOTE — Assessment & Plan Note (Signed)
 Net -2.1 L yesterday, weight down 3kg.  Creatinine improving with diuresis and potassium stable - Continue IV Lasix 60 BID - Daily BMP - Daily weights, strict ins and outs - Continue home beta-blocker, Jardiance, hydralazine

## 2023-08-29 DIAGNOSIS — I5033 Acute on chronic diastolic (congestive) heart failure: Secondary | ICD-10-CM | POA: Diagnosis not present

## 2023-08-29 LAB — BASIC METABOLIC PANEL WITH GFR
Anion gap: 12 (ref 5–15)
BUN: 36 mg/dL — ABNORMAL HIGH (ref 8–23)
CO2: 25 mmol/L (ref 22–32)
Calcium: 10.9 mg/dL — ABNORMAL HIGH (ref 8.9–10.3)
Chloride: 103 mmol/L (ref 98–111)
Creatinine, Ser: 1.54 mg/dL — ABNORMAL HIGH (ref 0.61–1.24)
GFR, Estimated: 47 mL/min — ABNORMAL LOW (ref >60)
Glucose, Bld: 122 mg/dL — ABNORMAL HIGH (ref 70–99)
Potassium: 3.5 mmol/L (ref 3.5–5.1)
Sodium: 140 mmol/L (ref 135–145)

## 2023-08-29 LAB — CBC
HCT: 38.2 % — ABNORMAL LOW (ref 39.0–52.0)
Hemoglobin: 12.3 g/dL — ABNORMAL LOW (ref 13.0–17.0)
MCH: 30 pg (ref 26.0–34.0)
MCHC: 32.2 g/dL (ref 30.0–36.0)
MCV: 93.2 fL (ref 80.0–100.0)
Platelets: 153 10*3/uL (ref 150–400)
RBC: 4.1 MIL/uL — ABNORMAL LOW (ref 4.22–5.81)
RDW: 16.2 % — ABNORMAL HIGH (ref 11.5–15.5)
WBC: 6.2 10*3/uL (ref 4.0–10.5)
nRBC: 0 % (ref 0.0–0.2)

## 2023-08-29 LAB — GLUCOSE, CAPILLARY
Glucose-Capillary: 150 mg/dL — ABNORMAL HIGH (ref 70–99)
Glucose-Capillary: 130 mg/dL — ABNORMAL HIGH (ref 70–99)
Glucose-Capillary: 136 mg/dL — ABNORMAL HIGH (ref 70–99)
Glucose-Capillary: 143 mg/dL — ABNORMAL HIGH (ref 70–99)

## 2023-08-29 MED ORDER — POTASSIUM CHLORIDE CRYS ER 20 MEQ PO TBCR
40.0000 meq | EXTENDED_RELEASE_TABLET | Freq: Once | ORAL | Status: AC
  Administered 2023-08-29: 40 meq via ORAL
  Filled 2023-08-29: qty 2

## 2023-08-29 NOTE — Progress Notes (Signed)
   08/29/23 2051  BiPAP/CPAP/SIPAP  $ Non-Invasive Ventilator  Non-Invasive Vent Subsequent  BiPAP/CPAP/SIPAP Pt Type Adult  BiPAP/CPAP/SIPAP DREAMSTATIOND  Mask Type Full face mask  Mask Size Large  EPAP 10 cmH2O  FiO2 (%) 21 %  Patient Home Machine No  Patient Home Mask No  Patient Home Tubing No  Auto Titrate No  Device Plugged into RED Power Outlet Yes  BiPAP/CPAP /SiPAP Vitals  Pulse Rate 76  Resp 18  SpO2 95 %  Bilateral Breath Sounds Clear;Diminished  MEWS Score/Color  MEWS Score 0  MEWS Score Color [place] Si

## 2023-08-29 NOTE — Plan of Care (Signed)
°  Problem: Clinical Measurements: °Goal: Diagnostic test results will improve °Outcome: Progressing °Goal: Respiratory complications will improve °Outcome: Progressing °  °Problem: Elimination: °Goal: Will not experience complications related to urinary retention °Outcome: Progressing °  °Problem: Safety: °Goal: Ability to remain free from injury will improve °Outcome: Progressing °  °

## 2023-08-29 NOTE — Progress Notes (Signed)
 Mobility Specialist Progress Note:   08/29/23 1005  Mobility  Activity Ambulated with assistance in hallway  Level of Assistance Standby assist, set-up cues, supervision of patient - no hands on  Assistive Device Four wheel walker  Distance Ambulated (ft) 200 ft  Activity Response Tolerated well  Mobility Referral Yes  Mobility visit 1 Mobility  Mobility Specialist Start Time (ACUTE ONLY) 1005  Mobility Specialist Stop Time (ACUTE ONLY) 1020  Mobility Specialist Time Calculation (min) (ACUTE ONLY) 15 min   Pt agreeable to mobility session. Required no physical assistance to ambulate with RW. Required x1 standing rest break d/t fatigue. VSS on RA. Pt left sitting EOB with all needs met.   Addison Lank Mobility Specialist Please contact via SecureChat or  Rehab office at [phone]

## 2023-08-29 NOTE — Plan of Care (Signed)
 ?  Problem: Clinical Measurements: ?Goal: Respiratory complications will improve ?Outcome: Progressing ?  ?Problem: Activity: ?Goal: Risk for activity intolerance will decrease ?Outcome: Progressing ?  ?Problem: Coping: ?Goal: Level of anxiety will decrease ?Outcome: Progressing ?  ?Problem: Elimination: ?Goal: Will not experience complications related to urinary retention ?Outcome: Progressing ?  ?

## 2023-08-29 NOTE — Progress Notes (Signed)
  Progress Note   Patient: Francis Cardenas BJY:[id number] DOB: 14-Dec-1950 DOA: 08/26/2023     2 DOS: the patient was seen and examined on 08/29/2023 at 10:25 AM      Brief hospital course: 73 y.o. M with MO, dCHF, EtOH 8-9 beers daily, no prior withdrawals, OSA, pAF on ELiquis, CKD IIIb baseline 1.8, DM, gout, PTSD, and ProsCA who presented with few weeks of leg swelling and SOB with exertion.     Recently seen in PCP's office and his torsemide was doubled, but his symptoms didn't improve so he came to the ER.     Assessment and Plan: * Acute on chronic diastolic CHF (congestive heart failure) (HCC) Net -2.1 L yesterday, weight down 3kg.  Creatinine improving with diuresis and potassium stable - Continue IV Lasix 60 BID - Daily BMP - Daily weights, strict ins and outs - Continue home beta-blocker, Jardiance, hydralazine    CKD (chronic kidney disease), stage IIIb (HCC) Creatinine actually trending down from baseline 1.7  BPH (benign prostatic hyperplasia) - Continue Flomax  Persistent atrial fibrillation (HCC) - Continue apixaban, carvedilol  Morbid obesity with BMI of 45.0-49.9, adult (HCC) Class III obesity, complicates care  OSA on CPAP - Continue CPAP  Type 2 diabetes mellitus with obesity (HCC) Diabetic polyneuropathy - Continue sliding scale corrections - Hold home gabapentin - Continue Lipitor  GERD (gastroesophageal reflux disease)     Benign essential hypertension BP trending up - Continue furosemide, hydralazine, beta-blocker - Hold amlodipine, may resume soon  Depressive disorder - On Xanax at home  Alcohol use disorder Reports that he drinks 8-9 beers per day, starting in the morning.  States he has never had withdrawal of any kind. - Stop CIWA - Continue thiamine and folate - TOC consult for EToh outpatient treatment, patient interested in quitting    Hypercalcemia Mild, asymptomatic.  Prior history of elevated PTH - Follow up with  PCP        Subjective: Patient feels like his swelling is getting slightly better.  He is able to ambulate with therapy.  No confusion, fever.     Physical Exam: BP (!) 140/67 (BP Location: Right Arm)   Pulse 69   Temp (!) 97.5 F (36.4 C) (Oral)   Resp 18   Ht 5\' 10"  (1.778 m)   Wt (!) 137.9 kg   SpO2 98%   BMI 43.63 kg/m   Adult male, sitting up in recliner, interactive, conversational RRR, no murmurs, JVP not possible to see, 1+ pitting edema to midshin Respiratory effort seems normal, lung sounds diminished, no rales Attention normal, affect normal, judgment and insight appear normal, face symmetric, generalized weakness but symmetric strength, speech fluent    Data Reviewed: Basic metabolic panel shows creatinine down to 1.5, potassium 3.5 CBC with mild anemia  Family Communication: Wife by phone    Disposition: Status is: Inpatient  73 y.o. M admitted with CHF failed outpatient mgmt  Will continue IV Lasix, hard to estimate fluid status given habitus, but suspect needs a few days more        Author: Alberteen Sam, MD 08/29/2023 12:49 PM  For on call review [email or web address].

## 2023-08-30 DIAGNOSIS — I5033 Acute on chronic diastolic (congestive) heart failure: Secondary | ICD-10-CM | POA: Diagnosis not present

## 2023-08-30 LAB — CBC
HCT: 41.7 % (ref 39.0–52.0)
Hemoglobin: 13.6 g/dL (ref 13.0–17.0)
MCH: 30.4 pg (ref 26.0–34.0)
MCHC: 32.6 g/dL (ref 30.0–36.0)
MCV: 93.1 fL (ref 80.0–100.0)
Platelets: 169 10*3/uL (ref 150–400)
RBC: 4.48 MIL/uL (ref 4.22–5.81)
RDW: 16.1 % — ABNORMAL HIGH (ref 11.5–15.5)
WBC: 6.9 10*3/uL (ref 4.0–10.5)
nRBC: 0 % (ref 0.0–0.2)

## 2023-08-30 LAB — BASIC METABOLIC PANEL WITH GFR
Anion gap: 12 (ref 5–15)
BUN: 30 mg/dL — ABNORMAL HIGH (ref 8–23)
CO2: 26 mmol/L (ref 22–32)
Calcium: 11.4 mg/dL — ABNORMAL HIGH (ref 8.9–10.3)
Chloride: 103 mmol/L (ref 98–111)
Creatinine, Ser: 1.46 mg/dL — ABNORMAL HIGH (ref 0.61–1.24)
GFR, Estimated: 50 mL/min — ABNORMAL LOW
Glucose, Bld: 129 mg/dL — ABNORMAL HIGH (ref 70–99)
Potassium: 4 mmol/L (ref 3.5–5.1)
Sodium: 141 mmol/L (ref 135–145)

## 2023-08-30 LAB — GLUCOSE, CAPILLARY
Glucose-Capillary: 129 mg/dL — ABNORMAL HIGH (ref 70–99)
Glucose-Capillary: 118 mg/dL — ABNORMAL HIGH (ref 70–99)
Glucose-Capillary: 119 mg/dL — ABNORMAL HIGH (ref 70–99)
Glucose-Capillary: 110 mg/dL — ABNORMAL HIGH (ref 70–99)

## 2023-08-30 LAB — MAGNESIUM: Magnesium: 2.3 mg/dL (ref 1.7–2.4)

## 2023-08-30 MED ORDER — ALUM & MAG HYDROXIDE-SIMETH 200-200-20 MG/5ML PO SUSP
30.0000 mL | ORAL | Status: DC | PRN
  Administered 2023-08-30: 30 mL via ORAL
  Filled 2023-08-30: qty 30

## 2023-08-30 MED ORDER — FUROSEMIDE 10 MG/ML IJ SOLN
40.0000 mg | Freq: Two times a day (BID) | INTRAMUSCULAR | Status: AC
  Administered 2023-08-30: 40 mg via INTRAVENOUS
  Filled 2023-08-30: qty 4

## 2023-08-30 MED ORDER — FUROSEMIDE 10 MG/ML IJ SOLN: 40.0000 mg | Freq: Two times a day (BID) | INTRAMUSCULAR | Status: DC

## 2023-08-30 MED ORDER — MELATONIN 3 MG PO TABS
3.0000 mg | ORAL_TABLET | Freq: Every day | ORAL | Status: DC
  Administered 2023-08-30: 3 mg via ORAL
  Filled 2023-08-30: qty 1

## 2023-08-30 NOTE — Progress Notes (Signed)
 PROGRESS NOTE    Francis Cardenas  RUE:[id number] DOB: [birth year] DOA: 08/26/2023 PCP: Benjiman Core, MD  72/M with morbid obesity, diastolic CHF, EtOH abuse, obstructive sleep apnea, paroxysmal A-fib, CKD 3B, diabetes, gout, PTSD, prostate cancer presented to the ED with progressive lower extremity edema and dyspnea on exertion, compliant with torsemide, PCP had increased torsemide dose recently  Subjective: -Feels much better overall, swelling has gone down a lot  Assessment and Plan:  Acute on chronic diastolic CHF  -Improving with diuresis, 10.6 L negative, weight down 17 LB  -Switch to oral Lasix tomorrow  -Continue Coreg, Jardiance, hydralazine  -Increase activity, discharge planning  CKD (chronic kidney disease), stage IIIb (HCC) -Improving, creatinine down to 1.46 -Cutting back on diuretics  BPH (benign prostatic hyperplasia) - Continue Flomax  Persistent atrial fibrillation (HCC) - Continue apixaban, carvedilol  Morbid obesity with BMI of 45.0-49.9, adult (HCC) Class III obesity, complicates care  OSA on CPAP - Continue CPAP  Type 2 diabetes mellitus with obesity (HCC) Diabetic polyneuropathy - Jardiance, SSI - Hold home gabapentin - Continue Lipitor  GERD (gastroesophageal reflux disease)   Benign essential hypertension BP trending up - Continue furosemide, hydralazine, beta-blocker - Hold amlodipine, may resume soon  Depressive disorder - On Xanax at home  Alcohol use disorder Reports that he drinks 8-9 beers per day, starting in the morning.  States he has never had withdrawal of any kind. -Counseled, thiamine and multivitamins  DVT prophylaxis: Apixaban Code Status: Full code Family Communication: None present Disposition Plan: Home likely tomorrow  Consultants:    Procedures:   Antimicrobials:    Objective: Vitals:   08/29/23 2051 08/30/23 0009 08/30/23 0616 08/30/23 0803  BP:  135/60 135/62 (!) 141/75  Pulse: 76 70 69 70   Resp: 18 18 18 18   Temp:  98.1 F (36.7 C) 97.6 F (36.4 C) 97.6 F (36.4 C)  TempSrc:  Axillary Oral Oral  SpO2: 95% 96% 94% 96%  Weight:   (!) 137.3 kg   Height:        Intake/Output Summary (Last 24 hours) at 08/30/2023 1123 Last data filed at 08/30/2023 0617 Gross per 24 hour  Intake 460 ml  Output 2425 ml  Net -1965 ml   Filed Weights   08/28/23 0743 08/29/23 0416 08/30/23 0616  Weight: (!) 140.6 kg (!) 137.9 kg (!) 137.3 kg    Examination:  General exam: Obese chronically ill male appears calm and comfortable  Respiratory system: Rare basilar Rales Cardiovascular system: S1 & S2 heard, RRR.  Abd: nondistended, soft and nontender.Normal bowel sounds heard. Central nervous system: Alert and oriented. No focal neurological deficits. Extremities: Trace edema Skin: No rashes Psychiatry:  Mood & affect appropriate.     Data Reviewed:   CBC: Recent Labs  Lab 08/26/23 0850 08/28/23 0323 08/29/23 0307 08/30/23 0258  WBC 6.1 5.6 6.2 6.9  NEUTROABS 4.5  --   --   --   HGB 11.7* 11.8* 12.3* 13.6  HCT 35.8* 36.7* 38.2* 41.7  MCV 93.0 93.4 93.2 93.1  PLT 154 151 153 169   Basic Metabolic Panel: Recent Labs  Lab 08/26/23 0850 08/26/23 1656 08/27/23 0236 08/28/23 0323 08/29/23 0307 08/30/23 0258  NA 134*  --  137 137 140 141  K 4.3  --  3.7 3.9 3.5 4.0  CL 98  --  101 103 103 103  CO2 27  --  27 24 25 26   GLUCOSE 139*  --  104* 124* 122* 129*  BUN  40*  --  39* 40* 36* 30*  CREATININE 1.89*  --  1.83* 1.72* 1.54* 1.46*  CALCIUM 10.6*  --  10.5* 10.7* 10.9* 11.4*  MG  --  2.3 2.4  --   --   --    GFR: Estimated Creatinine Clearance: 62 mL/min (A) (by C-G formula based on SCr of 1.46 mg/dL (H)). Liver Function Tests: Recent Labs  Lab 08/26/23 0850  AST 24  ALT 24  ALKPHOS 86  BILITOT 0.7  PROT 6.6  ALBUMIN 4.4   No results for input(s): "LIPASE", "AMYLASE" in the last 168 hours. No results for input(s): "AMMONIA" in the last 168  hours. Coagulation Profile: No results for input(s): "INR", "PROTIME" in the last 168 hours. Cardiac Enzymes: No results for input(s): "CKTOTAL", "CKMB", "CKMBINDEX", "TROPONINI" in the last 168 hours. BNP (last 3 results) No results for input(s): "PROBNP" in the last 8760 hours. HbA1C: No results for input(s): "HGBA1C" in the last 72 hours. CBG: Recent Labs  Lab 08/29/23 0718 08/29/23 1134 08/29/23 1523 08/29/23 2057 08/30/23 0656  GLUCAP 150* 130* 136* 143* 129*   Lipid Profile: No results for input(s): "CHOL", "HDL", "LDLCALC", "TRIG", "CHOLHDL", "LDLDIRECT" in the last 72 hours. Thyroid Function Tests: No results for input(s): "TSH", "T4TOTAL", "FREET4", "T3FREE", "THYROIDAB" in the last 72 hours. Anemia Panel: No results for input(s): "VITAMINB12", "FOLATE", "FERRITIN", "TIBC", "IRON", "RETICCTPCT" in the last 72 hours. Urine analysis:    Component Value Date/Time   COLORURINE YELLOW 05/01/2023 1521   APPEARANCEUR CLEAR 05/01/2023 1521   LABSPEC 1.005 05/01/2023 1521   PHURINE 5.0 05/01/2023 1521   GLUCOSEU 50 (A) 05/01/2023 1521   HGBUR NEGATIVE 05/01/2023 1521   BILIRUBINUR NEGATIVE 05/01/2023 1521   KETONESUR NEGATIVE 05/01/2023 1521   PROTEINUR NEGATIVE 05/01/2023 1521   UROBILINOGEN 0.2 03/24/2013 1255   NITRITE NEGATIVE 05/01/2023 1521   LEUKOCYTESUR NEGATIVE 05/01/2023 1521   Sepsis Labs: @LABRCNTIP (procalcitonin:4,lacticidven:4)  )No results found for this or any previous visit (from the past 240 hours).   Radiology Studies: No results found.   Scheduled Meds:  allopurinol  100 mg Oral Daily   apixaban  5 mg Oral BID   atorvastatin  40 mg Oral Q supper   carvedilol  6.25 mg Oral Daily   empagliflozin  25 mg Oral Daily   folic acid  1 mg Oral Daily   furosemide  40 mg Intravenous BID   hydrALAZINE  25 mg Oral Daily   insulin aspart  0-5 Units Subcutaneous QHS   insulin aspart  0-6 Units Subcutaneous TID WC   melatonin  3 mg Oral QHS    mometasone-formoterol  2 puff Inhalation BID   multivitamin with minerals  1 tablet Oral Daily   pantoprazole  40 mg Oral BID   prednisoLONE acetate  1 drop Both Eyes Daily   sodium chloride flush  3 mL Intravenous Q12H   tamsulosin  0.4 mg Oral QPC supper   thiamine  100 mg Oral Daily   Continuous Infusions:   LOS: 3 days    Time spent:    Zannie Cove, MD [facility]   08/30/2023, 11:23 AM

## 2023-08-30 NOTE — Progress Notes (Signed)
 Mobility Specialist Progress Note:   08/30/23 0936  Mobility  Activity Ambulated with assistance in hallway  Level of Assistance Standby assist, set-up cues, supervision of patient - no hands on  Assistive Device Four wheel walker  Distance Ambulated (ft) 200 ft  Activity Response Tolerated well  Mobility Referral Yes  Mobility visit 1 Mobility  Mobility Specialist Start Time (ACUTE ONLY) [id number]  Mobility Specialist Stop Time (ACUTE ONLY) 0950  Mobility Specialist Time Calculation (min) (ACUTE ONLY) 14 min   Pt agreeable to mobility session. C/o BUE fatigue with ambulation and rollator use. No other c/o. Pt back in chair with all needs met.   Addison Lank Mobility Specialist Please contact via SecureChat or  Rehab office at [phone]

## 2023-08-30 NOTE — Progress Notes (Signed)
 Heart Failure Navigator Progress Note  Assessed for Heart & Vascular TOC clinic readiness.  Patient does not meet criteria due to EF 60-65%, has a scheduled [facility] appointment on 09/11/2023. No HF TOC. .   Navigator will sign off at this time.   Rhae Hammock, BSN, Scientist, clinical (histocompatibility and immunogenetics) Only

## 2023-08-30 NOTE — Plan of Care (Signed)

## 2023-08-31 LAB — BASIC METABOLIC PANEL WITH GFR
Anion gap: 11 (ref 5–15)
BUN: 29 mg/dL — ABNORMAL HIGH (ref 8–23)
CO2: 25 mmol/L (ref 22–32)
Calcium: 11.3 mg/dL — ABNORMAL HIGH (ref 8.9–10.3)
Chloride: 104 mmol/L (ref 98–111)
Creatinine, Ser: 1.26 mg/dL — ABNORMAL HIGH (ref 0.61–1.24)
GFR, Estimated: 60 mL/min — ABNORMAL LOW (ref >60)
Glucose, Bld: 119 mg/dL — ABNORMAL HIGH (ref 70–99)
Potassium: 3.6 mmol/L (ref 3.5–5.1)
Sodium: 140 mmol/L (ref 135–145)

## 2023-08-31 LAB — GLUCOSE, CAPILLARY: Glucose-Capillary: 130 mg/dL — ABNORMAL HIGH (ref 70–99)

## 2023-08-31 NOTE — Plan of Care (Signed)
  Problem: Education: Goal: Knowledge of General Education information will improve Description: Including pain rating scale, medication(s)/side effects and non-pharmacologic comfort measures Outcome: Adequate for Discharge   Problem: Health Behavior/Discharge Planning: Goal: Ability to manage health-related needs will improve Outcome: Adequate for Discharge   Problem: Clinical Measurements: Goal: Ability to maintain clinical measurements within normal limits will improve Outcome: Adequate for Discharge Goal: Will remain free from infection Outcome: Adequate for Discharge Goal: Respiratory complications will improve Outcome: Adequate for Discharge   Problem: Activity: Goal: Risk for activity intolerance will decrease Outcome: Adequate for Discharge

## 2023-08-31 NOTE — Progress Notes (Signed)
 Mobility Specialist Progress Note:   08/31/23 0915  Mobility  Activity Ambulated with assistance in hallway  Level of Assistance Standby assist, set-up cues, supervision of patient - no hands on  Assistive Device Four wheel walker  Distance Ambulated (ft) 450 ft  Activity Response Tolerated well  Mobility Referral Yes  Mobility visit 1 Mobility  Mobility Specialist Start Time (ACUTE ONLY) 0915  Mobility Specialist Stop Time (ACUTE ONLY) 0930  Mobility Specialist Time Calculation (min) (ACUTE ONLY) 15 min   Pt agreeable to mobility session. Required no physical assistance throughout. Displayed minor SOB, however VSS on RA. Pt back in chair with all needs met.   Addison Lank Mobility Specialist Please contact via SecureChat or  Rehab office at [phone]

## 2023-08-31 NOTE — TOC Transition Note (Signed)
 Transition of Care [facility]) - Discharge Note   Patient Details  Name: Francis Cardenas MRN: [id number] Date of Birth: June 18, 1950  Transition of Care [facility]) CM/SW Contact:  Leone Haven, RN Phone Number: 08/31/2023, 10:04 AM   Clinical Narrative:    For dc today, wife will transport him home, has no needs.         Patient Goals and CMS Choice            Discharge Placement                       Discharge Plan and Services Additional resources added to the After Visit Summary for                                       Social Drivers of Health (SDOH) Interventions SDOH Screenings   Food Insecurity: No Food Insecurity (08/26/2023)  Housing: Low Risk  (08/26/2023)  Transportation Needs: No Transportation Needs (08/26/2023)  Utilities: Not At Risk (08/26/2023)  Alcohol Screen: Low Risk  (12/26/2022)  Financial Resource Strain: Low Risk  (12/26/2022)  Social Connections: Socially Isolated (08/26/2023)  Tobacco Use: Medium Risk (08/26/2023)     Readmission Risk Interventions    09/02/2021   11:32 AM  Readmission Risk Prevention Plan  Transportation Screening Complete  PCP or Specialist Appt within 3-5 Days --  HRI or Home Care Consult Complete  Palliative Care Screening Not Applicable  Medication Review (RN Care Manager) Complete

## 2023-08-31 NOTE — Progress Notes (Signed)
 Patient alert and oriented, verbalized understanding of dc instructions.all belongings and paperwork given to patient.Patient reported he has all medicines listed on AVS at home.

## 2023-08-31 NOTE — Progress Notes (Signed)
 Left forearm piv dcd.purewick dcd. Tolerated well. Ccmnd notified patient for dcd.

## 2023-09-11 ENCOUNTER — Ambulatory Visit (INDEPENDENT_AMBULATORY_CARE_PROVIDER_SITE_OTHER): Admitting: Family

## 2023-09-11 ENCOUNTER — Encounter (HOSPITAL_BASED_OUTPATIENT_CLINIC_OR_DEPARTMENT_OTHER): Payer: Self-pay | Admitting: Family

## 2023-09-11 VITALS — BP 112/50 | HR 80 | Ht 70.0 in | Wt 303.0 lb

## 2023-09-11 DIAGNOSIS — D6859 Other primary thrombophilia: Secondary | ICD-10-CM

## 2023-09-11 DIAGNOSIS — R5383 Other fatigue: Secondary | ICD-10-CM

## 2023-09-11 DIAGNOSIS — I4819 Other persistent atrial fibrillation: Secondary | ICD-10-CM | POA: Diagnosis not present

## 2023-09-11 DIAGNOSIS — I10 Essential (primary) hypertension: Secondary | ICD-10-CM

## 2023-09-11 DIAGNOSIS — I5032 Chronic diastolic (congestive) heart failure: Secondary | ICD-10-CM | POA: Diagnosis not present

## 2023-09-11 DIAGNOSIS — N1831 Chronic kidney disease, stage 3a: Secondary | ICD-10-CM | POA: Diagnosis not present

## 2023-09-11 NOTE — Patient Instructions (Addendum)
 Medication Instructions:  Be sure to only take your Torsemide  twice per day.   Make sure you are not taking Lisinopril .  Lab Work: Your physician recommends that you return for lab work today: BMET, BNP, TSH  This is to recheck your kidney function, electrolytes, fluid levels. We will route the results to your primary care provider at [facility].   Follow-Up: Your next appointment:   6 week(s)  Provider:   Sheryle Donning, MD, Slater Duncan, NP, or Neomi Banks, NP    Other Instructions  Recommend drinking no more than 2L (64 oz) of fluid (water, diet ginger ale, etc) per day Keep up the great work walking around the house!

## 2023-09-11 NOTE — Progress Notes (Signed)
 Cardiology Office Note:  .   Date:  09/11/2023  ID:  ICHAEL PULLARA, DOB April 28, 1951, MRN [id number] PCP: Doak Free, MD  [facility] Providers Cardiologist:  Sheryle Donning, MD    History of Present Illness: .   KRESTON AHRENDT is a 73 y.o. male with hx of HFpEF, CKDIIIb, BPH, permanent atrial fibrillation, morbid obesity, OSA on CPAP, DM2, diabetic polyneuropathy, GERD, HTN, etoh use.  Last seen 01/2022 recommended to [facility]   Echo 12/25/22 LVEF 60 to 65%, no RWMA, mild LVH, indeterminate diastolic parameters in the setting of atrial fibrillation, RV SF mildly reduced, RV moderately enlarged, mildly elevated PASP, small pericardial effusion, trivial MR.  Admitted 4/5-4/10/25 for  acute on chronic diastolic heart failure. Diuresed 10.6L with weight down 17 lbs. noted that he continued drinking 8-9 beers per day starting in the morning.  Discharged on torsemide  20 mg twice daily, hydralazine  25 mg daily, Jardiance  25 mg daily, carvedilol  6.25 mg twice daily.  Creatinine prior to discharge improved to 1.46. Discharge weight 302 lbs.   Presents today for follow-up independently.  Reports he has not had a drink since discharge but is feeling more frustrated and considering resuming drinking.  Reports weight at home gradually decreasing. He did take an extra Tosremide yesterday at lunchtime, unclear what prompted extra dose. He had labs with PCP 09/06/23 creatinine 3.05 GFR 21, BUN 86, AST 21, ALT 29, Hb 12.8. He notes he was contacted for an urgent nephrology appointment but is uninterested as feeling overwhelmed by multiple appointments (has eye doctor and HVAC this week). He is uncertain if he is taking LIsinopril , discussed to discontinue. He reports ongoing fatigue, exertional dyspnea. Has seen improvement in stamina since decreased fluid status after admission.  ROS: Please see the history of present illness.    All other systems  reviewed and are negative.   Studies Reviewed: .        Cardiac Studies & Procedures   ______________________________________________________________________________________________ CARDIAC CATHETERIZATION  CARDIAC CATHETERIZATION 04/09/2019  Conclusion  LV end diastolic pressure is moderately elevated.  The left ventricular systolic function is normal.  The left ventricular ejection fraction is 55-65% by visual estimate.  There is trivial (1+) mitral regurgitation.  There is no aortic valve stenosis.  Hemodynamic findings consistent with moderate pulmonary hypertension.  1. Normal coronary anatomy 2. Normal LV systolic function 3. Moderately elevated LV filling pressures. Large V waves to 58 mm Hg c/w LV restriction. No significant MR noted. 4. No AV gradient. 5. Moderate pulmonary HTN 6. Preserved cardiac output. Index 3.  Findings Coronary Findings Diagnostic  Dominance: Right  Left Main Vessel was injected. Vessel is normal in caliber. Vessel is angiographically normal.  Left Anterior Descending Vessel was injected. Vessel is normal in caliber. Vessel is angiographically normal.  Left Circumflex Vessel was injected. Vessel is normal in caliber. Vessel is angiographically normal.  Right Coronary Artery Vessel was injected. Vessel is normal in caliber. Vessel is angiographically normal.  Intervention  No interventions have been documented.   STRESS TESTS  NM MYOCAR MULTI W/SPECT W 04/08/2019  Narrative  There was no ST segment deviation noted during stress.  The left ventricular ejection fraction is mildly decreased (45-54%).  This is an intermediate risk study.  There is a fixed perfusion defect of moderate severity present in the inferior/inferolateral segments base to apex.  This defect is moderate in size and comprises roughly 15-20% of the LV myocardium.  The wall  motion in this region remains normal, and there is significant gut uptake artifact  present on the study.  Overall, this likely reflects artifact due to increased radiotracer in the gut, but unable to exclude ischemia in this region.  There is no evidence of reversible ischemia in the other segments.  Impression:  1.  Equivocal myocardial perfusion imaging study with significant gut uptake artifact.  Indeterminate for ischemia in the inferior/inferolateral segments as described above. 2.  Mildly dilated left ventricular cavity with mildly reduced ejection fraction, 45%.  Melodee Spruce T. Rolm Clos, MD [facility] [address] [place], [place] [address] [phone] 4:35 PM   ECHOCARDIOGRAM  ECHOCARDIOGRAM COMPLETE 12/25/2022  Narrative ECHOCARDIOGRAM REPORT    Patient Name:   KEMPER HEUPEL Date of Exam: 12/25/2022 Medical Rec #:  [id number]      Height:       70.0 in Accession #:    [id number]     Weight:       304.2 lb Date of Birth:  May 14, 1951     BSA:          2.495 m Patient Age:    71 years       BP:           106/44 mmHg Patient Gender: M              HR:           60 bpm. Exam Location:  Inpatient  Procedure: 2D Echo, Cardiac Doppler, Color Doppler and Intracardiac Opacification Agent  Indications:    CHF-Acute Diastolic I50.31  History:        Patient has prior history of Echocardiogram examinations, most recent 08/31/2021. CHF, COPD; Risk Factors:Sleep Apnea, Diabetes, Former Smoker and Dyslipidemia.  Sonographer:    Adelia Homestead RVT RCS Referring Phys: [id number] Gayl Katos [facility]   Sonographer Comments: Technically difficult study due to poor echo windows and patient is obese. IMPRESSIONS   1. Left ventricular ejection fraction, by estimation, is 60 to 65%. The left ventricle has normal function. The left ventricle has no regional wall motion abnormalities. There is mild left ventricular hypertrophy. Left ventricular diastolic parameters are indeterminate. 2. Right ventricular systolic function is mildly reduced. The right  ventricular size is moderately enlarged. There is mildly elevated pulmonary artery systolic pressure. The estimated right ventricular systolic pressure is 40.5 mmHg. 3. Left atrial size was moderately dilated. 4. Right atrial size was mildly dilated. 5. A small pericardial effusion is present. The pericardial effusion is anterior to the right ventricle. 6. The mitral valve is grossly normal. Trivial mitral valve regurgitation. No evidence of mitral stenosis. 7. The aortic valve is tricuspid. Aortic valve regurgitation is not visualized. Aortic valve sclerosis/calcification is present, without any evidence of aortic stenosis. 8. The inferior vena cava is dilated in size with >50% respiratory variability, suggesting right atrial pressure of 8 mmHg.  FINDINGS Left Ventricle: Left ventricular ejection fraction, by estimation, is 60 to 65%. The left ventricle has normal function. The left ventricle has no regional wall motion abnormalities. Definity  contrast agent was given IV to delineate the left ventricular endocardial borders. The left ventricular internal cavity size was normal in size. There is mild left ventricular hypertrophy. Left ventricular diastolic parameters are indeterminate.  Right Ventricle: The right ventricular size is moderately enlarged. No increase in right ventricular wall thickness. Right ventricular systolic function is mildly reduced. There is mildly elevated pulmonary artery systolic pressure. The tricuspid regurgitant velocity is 2.85  m/s, and with an assumed right atrial pressure of 8 mmHg, the estimated right ventricular systolic pressure is 40.5 mmHg.  Left Atrium: Left atrial size was moderately dilated.  Right Atrium: Right atrial size was mildly dilated.  Pericardium: A small pericardial effusion is present. The pericardial effusion is anterior to the right ventricle. Presence of epicardial fat layer.  Mitral Valve: The mitral valve is grossly normal. Trivial mitral  valve regurgitation. No evidence of mitral valve stenosis.  Tricuspid Valve: The tricuspid valve is normal in structure. Tricuspid valve regurgitation is trivial. No evidence of tricuspid stenosis.  Aortic Valve: The aortic valve is tricuspid. Aortic valve regurgitation is not visualized. Aortic valve sclerosis/calcification is present, without any evidence of aortic stenosis. Aortic valve mean gradient measures 5.0 mmHg. Aortic valve peak gradient measures 9.4 mmHg. Aortic valve area, by VTI measures 3.61 cm.  Pulmonic Valve: The pulmonic valve was not well visualized. Pulmonic valve regurgitation is not visualized. No evidence of pulmonic stenosis.  Aorta: The aortic root is normal in size and structure. Ascending aorta measurements are within normal limits for age when indexed to body surface area.  Venous: The inferior vena cava is dilated in size with greater than 50% respiratory variability, suggesting right atrial pressure of 8 mmHg.  IAS/Shunts: No atrial level shunt detected by color flow Doppler.   LEFT VENTRICLE PLAX 2D LVIDd:         5.40 cm   Diastology LVIDs:         2.80 cm   LV e' medial: 9.73 cm/s LV PW:         1.30 cm LV IVS:        1.00 cm LVOT diam:     2.30 cm LV SV:         118 LV SV Index:   47 LVOT Area:     4.15 cm   RIGHT VENTRICLE             IVC RV Basal diam:  4.40 cm     IVC diam: 2.50 cm RV S prime:     15.30 cm/s TAPSE (M-mode): 2.5 cm  LEFT ATRIUM              Index        RIGHT ATRIUM           Index LA diam:        4.30 cm  1.72 cm/m   RA Area:     23.90 cm LA Vol (A2C):   118.0 ml 47.30 ml/m  RA Volume:   77.40 ml  31.03 ml/m LA Vol (A4C):   104.0 ml 41.69 ml/m LA Biplane Vol: 116.0 ml 46.50 ml/m AORTIC VALVE                     PULMONIC VALVE AV Area (Vmax):    3.49 cm      PV Vmax:       0.81 m/s AV Area (Vmean):   3.40 cm      PV Peak grad:  2.7 mmHg AV Area (VTI):     3.61 cm AV Vmax:           153.50 cm/s AV Vmean:           103.000 cm/s AV VTI:            0.326 m AV Peak Grad:      9.4 mmHg AV Mean Grad:      5.0 mmHg LVOT  Vmax:         129.00 cm/s LVOT Vmean:        84.200 cm/s LVOT VTI:          0.283 m LVOT/AV VTI ratio: 0.87  AORTA Ao Root diam: 3.10 cm Ao Asc diam:  3.80 cm  TRICUSPID VALVE TR Peak grad:   32.5 mmHg TR Vmax:        285.00 cm/s  SHUNTS Systemic VTI:  0.28 m Systemic Diam: 2.30 cm  Grady Lawman MD Electronically signed by Grady Lawman MD Signature Date/Time: 12/25/2022/10:47:22 AM    Final          ______________________________________________________________________________________________      Risk Assessment/Calculations:    CHA2DS2-VASc Score = 4   This indicates a 4.8% annual risk of stroke. The patient's score is based upon: CHF History: 1 HTN History: 1 Diabetes History: 1 Stroke History: 0 Vascular Disease History: 0 Age Score: 1 Gender Score: 0            Physical Exam:   VS:  BP (!) 112/50 (BP Location: Left Arm, Patient Position: Sitting, Cuff Size: Normal)   Pulse 80   Ht 5\' 10"  (1.778 m)   Wt (!) 303 lb (137.4 kg)   BMI 43.48 kg/m    Wt Readings from Last 3 Encounters:  09/11/23 (!) 303 lb (137.4 kg)  08/31/23 297 lb 9.9 oz (135 kg)  08/04/23 (!) 323 lb 9.6 oz (146.8 kg)    GEN: Well nourished, overweight, well developed in no acute distress NECK: No JVD; No carotid bruits CARDIAC: IRIR, no murmurs, rubs, gallops RESPIRATORY:  Clear to auscultation without rales, wheezing or rhonchi  ABDOMEN: Soft, non-tender, non-distended EXTREMITIES:  No edema; No deformity   ASSESSMENT AND PLAN: .    Assessment & Plan HFpEF /hypertension Heart failure with fluid overload, managed with torsemide . Reduced leg swelling post-hospitalization, but fatigue and fluid management issues persist. Has been taking extra doses of midday Torsemide  at his discretion (prescribed BID).  - Order BMET/BNP levels.  Medication changes pending lab  results. -Continue Torsemide  20mg  BID, Jardiance  25mg  daily, Hydralazine  25mg  daily until lab results obtained. Unclear why Hydralazine  only daily.  Consider adjustment to Hydralazine  BID at follow up with consideration of reducing Amlodipine  if needed for BP room.  -Avoid MRA due to renal dysfunction. -Lisinopril  held during recent admission,ensure not taking at home.  Alcohol  use Prior to admission 8-9 beers per day -Continued avoidance of alcohol  recommended.  HTN Relatively hypotensive but asymptomatic with no lightheadedness.  Continue present medication regimen, as above.  Persistent atrial fibrillation/hypercoagulable state Rate controlled by auscultation today. CHA2DS2-VASc Score = 4 [CHF History: 1, HTN History: 1, Diabetes History: 1, Stroke History: 0, Vascular Disease History: 0, Age Score: 1, Gender Score: 0].  Therefore, the patient's annual risk of stroke is 4.8 %.     - Continue Eliquis  5 mg twice daily, does not meet dose reduction criteria. -Continue carvedilol  6.25 mg twice daily.  Chronic Kidney Disease Worsening kidney function post-discharge, 08/31/23 creatinine 1.26 ? 09/06/23 creatinine 3.05, GFR 21. Extra doses torsemide  he has been taking likely contribute to kidney strain. Discussed fluid management and kidney function balance. PCP had gotten him an urgent nephrology appt for today which he declined, now scheduled for 01/01/24 at 3p.  - BMET and BNP today - Advise against extra doses of torsemide . -He will check pill bottles at home to ensure not taking Lisinopril . -Consider medication adjustments based on lab results.   Fatigue  Persistent fatigue post-hospitalization, possibly related to fluid overload vs deconditioning. Thyroid  dysfunction considered. - Check thyroid  function.  DM2 Continue to follow with PCP.           Dispo: follow up in 6 weeks  Signed, Clearnce Curia, NP

## 2023-09-12 ENCOUNTER — Telehealth (HOSPITAL_BASED_OUTPATIENT_CLINIC_OR_DEPARTMENT_OTHER): Payer: Self-pay | Admitting: Family

## 2023-09-12 ENCOUNTER — Encounter (HOSPITAL_BASED_OUTPATIENT_CLINIC_OR_DEPARTMENT_OTHER): Payer: Self-pay

## 2023-09-12 LAB — BRAIN NATRIURETIC PEPTIDE: BNP: 99.7 pg/mL (ref 0.0–100.0)

## 2023-09-12 LAB — BASIC METABOLIC PANEL WITH GFR
BUN/Creatinine Ratio: 23 (ref 10–24)
BUN: 37 mg/dL — ABNORMAL HIGH (ref 8–27)
CO2: 22 mmol/L (ref 20–29)
Calcium: 11.5 mg/dL — ABNORMAL HIGH (ref 8.6–10.2)
Chloride: 102 mmol/L (ref 96–106)
Creatinine, Ser: 1.58 mg/dL — ABNORMAL HIGH (ref 0.76–1.27)
Glucose: 113 mg/dL — ABNORMAL HIGH (ref 70–99)
Potassium: 3.8 mmol/L (ref 3.5–5.2)
Sodium: 141 mmol/L (ref 134–144)
eGFR: 46 mL/min/{1.73_m2} — ABNORMAL LOW (ref >59)

## 2023-09-12 LAB — TSH: TSH: 2.5 u[IU]/mL (ref 0.450–4.500)

## 2023-09-12 NOTE — Telephone Encounter (Signed)
 Labs reviewed via [facility] DXA from 09/12/23. BNP still pending. Creatinine 1.58, BUN 37, GFR 46, Ca 11.5, K 3.8, TSH 2.5.  Will ask nursing team to update [name]: Kidney function improving.  Follow up with nephrology in August as arranged by PCP Continue Torsemide  20mg  BID (avoid extra doses). Ensure not taking Lisinopril  as discussed in clinic.    Francis Cardenas S Francis Maione, NP

## 2023-09-12 NOTE — Telephone Encounter (Signed)
Called patient, no answer, left detailed message (ok per DPR).

## 2023-09-13 NOTE — Discharge Summary (Addendum)
 Physician Discharge Summary  Francis Cardenas ION:[id number] DOB: 11-10-1950 DOA: 08/26/2023  PCP: Margaret Sharp, PA-C  Admit date: 08/26/2023 Discharge date: 08/31/2023  Time spent: 35 minutes  Recommendations for Outpatient Follow-up:  [facility] 4/21, please check BMP at follow-up   Discharge Diagnoses:  Principal Problem:   Acute on chronic diastolic CHF (congestive heart failure) (HCC) Active Problems:   Alcohol  use disorder   Depressive disorder   Benign essential hypertension   Type 2 diabetes mellitus with obesity (HCC)   OSA on CPAP   Morbid obesity with BMI of 45.0-49.9, adult (HCC)   Persistent atrial fibrillation (HCC)   BPH (benign prostatic hyperplasia)   CKD (chronic kidney disease), stage IIIb [facility])   Discharge Condition: Improved  Diet recommendation: Diabetic, heart healthy  Filed Weights   08/29/23 0416 08/30/23 0616 08/31/23 0357  Weight: (!) 137.9 kg (!) 137.3 kg 135 kg    History of present illness:  72/M with morbid obesity, diastolic CHF, EtOH abuse, obstructive sleep apnea, paroxysmal A-fib, CKD 3B, diabetes, gout, PTSD, prostate cancer presented to the ED with progressive lower extremity edema and dyspnea on exertion, compliant with torsemide , PCP had increased torsemide  dose recently   Hospital Course:   Acute on chronic diastolic CHF  -Improving with diuresis, 10.6 L negative, weight down 17 LB  -Switch to oral torsemide  -Continue Coreg , Jardiance , hydralazine   - Discharged home in stable condition, follow-up with [facility] on 4/21, advised on EtOH cessation   CKD (chronic kidney disease), stage IIIb (HCC) -Improving, creatinine down to 1.46 -Cutting back on diuretics   BPH (benign prostatic hyperplasia) - Continue Flomax    Persistent atrial fibrillation (HCC) - Continue apixaban , carvedilol    Morbid obesity with BMI of 45.0-49.9, adult (HCC) Class III obesity, complicates care   OSA on CPAP - Continue CPAP   Type 2  diabetes mellitus with obesity (HCC) Diabetic polyneuropathy - Jardiance , SSI   GERD (gastroesophageal reflux disease)   Benign essential hypertension BP trending up - Continue furosemide , hydralazine , beta-blocker   Depressive disorder - On Xanax  at home   Alcohol  use disorder Reports that he drinks 8-9 beers per day, starting in the morning.  States he has never had withdrawal of any kind. -Counseled, thiamine  and multivitamins  Discharge Exam: Vitals:   08/31/23 0732 08/31/23 0823  BP: (!) 151/80 (!) 149/79  Pulse: 83   Resp: 16   Temp: 97.9 F (36.6 C) 97.7 F (36.5 C)  SpO2: 98% 98%    Gen: Awake, Alert, Oriented X 3,  HEENT: no JVD Lungs: Good air movement bilaterally, CTAB CVS: S1S2/RRR Abd: soft, Non tender, non distended, BS present Extremities: No edema Skin: no new rashes on exposed skin   Discharge Instructions   Discharge Instructions     Diet - low sodium heart healthy   Complete by: As directed    Increase activity slowly   Complete by: As directed       Allergies as of 08/31/2023       Reactions   Oxcarbazepine  Other (See Comments)   Dizziness/ lightheaded   Zolpidem  Nausea Only        Medication List     STOP taking these medications    lisinopril  20 MG tablet Commonly known as: ZESTRIL    meloxicam  15 MG tablet Commonly known as: MOBIC        TAKE these medications    acetaminophen  325 MG tablet Commonly known as: Tylenol  Take 2 tablets (650 mg total) by  mouth every 6 (six) hours as needed.   albuterol  108 (90 Base) MCG/ACT inhaler Commonly known as: VENTOLIN  HFA Inhale 2 puffs into the lungs every 6 (six) hours as needed for wheezing or shortness of breath.   allopurinol  100 MG tablet Commonly known as: ZYLOPRIM  Take 100 mg by mouth daily.   allopurinol  300 MG tablet Commonly known as: ZYLOPRIM  Take 300 mg by mouth daily.   ALPRAZolam  1 MG tablet Commonly known as: XANAX  Take 1 tablet (1 mg total) by mouth at  bedtime.   amLODipine  10 MG tablet Commonly known as: NORVASC  Take 10 mg by mouth daily.   apixaban  5 MG Tabs tablet Commonly known as: ELIQUIS  Take 1 tablet (5 mg total) by mouth 2 (two) times daily.   atorvastatin  80 MG tablet Commonly known as: LIPITOR Take 40 mg by mouth daily with supper. NOTIFY CLINIC FOR UNUSUAL MUSCLE ACHES OR WEAKNESS, BROWN URINE, BLOOD IN URINE, YELLOWING OF SKIN/WHITES OF EYES, PERSISTENT NAUSEA/VOMITING/DIARRHEA; REPLACES PRAVASTATIN  FOR CHOLESTEROL NOTIFY CLINIC FOR UNUSUAL MUSCLE ACHES OR WEAKNESS, BROWN URINE, BLOOD IN URINE, YELLOWING OF SKIN/WHITES OF EYES, PERSISTENT NAUSEA/VOMITING/DIARRHEA; REPLACES PRAVASTATIN    carvedilol  6.25 MG tablet Commonly known as: COREG  Take 6.25 mg by mouth daily. Hold dose for systolic blood pressure below 161 or pulse less than 50   empagliflozin  25 MG Tabs tablet Commonly known as: Jardiance  Take 1 tablet (25 mg total) by mouth daily.   fluticasone -salmeterol 250-50 MCG/ACT Aepb Commonly known as: Wixela Inhub Inhale 1 puff into the lungs in the morning and at bedtime.   gabapentin  300 MG capsule Commonly known as: NEURONTIN  Take 1 capsule (300 mg total) by mouth 2 (two) times daily. What changed: when to take this   guaifenesin  400 MG Tabs tablet Commonly known as: HUMIBID E Take 400 mg by mouth daily.   hydrALAZINE  25 MG tablet Commonly known as: APRESOLINE  Take 25 mg by mouth daily.   Magnesium  Oxide 420 MG Tabs Take 420 mg by mouth daily with supper.   omeprazole  20 MG capsule Commonly known as: PRILOSEC Take 20 mg by mouth daily with supper.   prednisoLONE  acetate 1 % ophthalmic suspension Commonly known as: PRED FORTE  Place 1 drop into both eyes daily.   Spiriva  Respimat 2.5 MCG/ACT Aers Generic drug: Tiotropium Bromide  Monohydrate Inhale 2 puffs into the lungs daily.   tamsulosin  0.4 MG Caps capsule Commonly known as: FLOMAX  Take 0.4 mg by mouth daily after supper.   torsemide  20 MG  tablet Commonly known as: DEMADEX  Take 20 mg by mouth 2 (two) times daily. Hold dose for systolic blood pressure less than 100   vitamin B-12 500 MCG tablet Commonly known as: CYANOCOBALAMIN  Take 500 mcg by mouth daily with supper.       Allergies  Allergen Reactions   Oxcarbazepine  Other (See Comments)    Dizziness/ lightheaded   Zolpidem  Nausea Only    Follow-up Information     Doak Free, MD Follow up.   Specialty: Internal Medicine Why: Please follow up in one week! Contact information: [address] [place] [address] [phone]                  The results of significant diagnostics from this hospitalization (including imaging, microbiology, ancillary and laboratory) are listed below for reference.    Significant Diagnostic Studies: DG Chest Port 1 View Result Date: 08/26/2023 CLINICAL DATA:  Chest pain EXAM: PORTABLE CHEST 1 VIEW COMPARISON:  05/01/2023 FINDINGS: Heart and mediastinal contours are within  normal limits. No focal opacities or effusions. No acute bony abnormality. IMPRESSION: No active disease. Electronically Signed   By: Janeece Mechanic M.D.   On: 08/26/2023 10:16    Microbiology: No results found for this or any previous visit (from the past 240 hours).   Labs: Basic Metabolic Panel: Recent Labs  Lab 09/11/23 1157  NA 141  K 3.8  CL 102  CO2 22  GLUCOSE 113*  BUN 37*  CREATININE 1.58*  CALCIUM  11.5*   Liver Function Tests: No results for input(s): "AST", "ALT", "ALKPHOS", "BILITOT", "PROT", "ALBUMIN" in the last 168 hours. No results for input(s): "LIPASE", "AMYLASE" in the last 168 hours. No results for input(s): "AMMONIA" in the last 168 hours. CBC: No results for input(s): "WBC", "NEUTROABS", "HGB", "HCT", "MCV", "PLT" in the last 168 hours. Cardiac Enzymes: No results for input(s): "CKTOTAL", "CKMB", "CKMBINDEX", "TROPONINI" in the last 168 hours. BNP: BNP (last 3 results) Recent Labs     12/24/22 0414 08/26/23 0850 09/11/23 1157  BNP 287.3* 164.9* 99.7    ProBNP (last 3 results) No results for input(s): "PROBNP" in the last 8760 hours.  CBG: No results for input(s): "GLUCAP" in the last 168 hours.     Signed:  Deforest Fast MD.  [facility] 09/13/2023, 2:31 PM

## 2023-10-25 ENCOUNTER — Emergency Department (HOSPITAL_COMMUNITY)

## 2023-10-25 ENCOUNTER — Observation Stay (HOSPITAL_COMMUNITY)
Admission: EM | Admit: 2023-10-25 | Discharge: 2023-10-27 | Disposition: A | Discharge status: Home / Self Care | Attending: Internal Medicine | Admitting: Internal Medicine

## 2023-10-25 DIAGNOSIS — N400 Enlarged prostate without lower urinary tract symptoms: Secondary | ICD-10-CM | POA: Diagnosis not present

## 2023-10-25 DIAGNOSIS — I4891 Unspecified atrial fibrillation: Secondary | ICD-10-CM | POA: Diagnosis not present

## 2023-10-25 DIAGNOSIS — G4733 Obstructive sleep apnea (adult) (pediatric): Secondary | ICD-10-CM | POA: Diagnosis not present

## 2023-10-25 DIAGNOSIS — Z8546 Personal history of malignant neoplasm of prostate: Secondary | ICD-10-CM | POA: Insufficient documentation

## 2023-10-25 DIAGNOSIS — R770 Abnormality of albumin: Secondary | ICD-10-CM | POA: Insufficient documentation

## 2023-10-25 DIAGNOSIS — I130 Hypertensive heart and chronic kidney disease with heart failure and stage 1 through stage 4 chronic kidney disease, or unspecified chronic kidney disease: Secondary | ICD-10-CM | POA: Insufficient documentation

## 2023-10-25 DIAGNOSIS — Z79899 Other long term (current) drug therapy: Secondary | ICD-10-CM | POA: Insufficient documentation

## 2023-10-25 DIAGNOSIS — Z6841 Body Mass Index (BMI) 40.0 and over, adult: Secondary | ICD-10-CM | POA: Diagnosis not present

## 2023-10-25 DIAGNOSIS — N179 Acute kidney failure, unspecified: Secondary | ICD-10-CM

## 2023-10-25 DIAGNOSIS — Z87891 Personal history of nicotine dependence: Secondary | ICD-10-CM | POA: Diagnosis not present

## 2023-10-25 DIAGNOSIS — E1122 Type 2 diabetes mellitus with diabetic chronic kidney disease: Secondary | ICD-10-CM | POA: Insufficient documentation

## 2023-10-25 DIAGNOSIS — R001 Bradycardia, unspecified: Principal | ICD-10-CM

## 2023-10-25 DIAGNOSIS — Z7984 Long term (current) use of oral hypoglycemic drugs: Secondary | ICD-10-CM | POA: Diagnosis not present

## 2023-10-25 DIAGNOSIS — F1090 Alcohol use, unspecified, uncomplicated: Secondary | ICD-10-CM | POA: Insufficient documentation

## 2023-10-25 DIAGNOSIS — W19XXXA Unspecified fall, initial encounter: Principal | ICD-10-CM

## 2023-10-25 DIAGNOSIS — R296 Repeated falls: Secondary | ICD-10-CM | POA: Insufficient documentation

## 2023-10-25 DIAGNOSIS — E871 Hypo-osmolality and hyponatremia: Secondary | ICD-10-CM

## 2023-10-25 DIAGNOSIS — J449 Chronic obstructive pulmonary disease, unspecified: Secondary | ICD-10-CM | POA: Insufficient documentation

## 2023-10-25 DIAGNOSIS — I5032 Chronic diastolic (congestive) heart failure: Secondary | ICD-10-CM

## 2023-10-25 DIAGNOSIS — Z7901 Long term (current) use of anticoagulants: Secondary | ICD-10-CM | POA: Insufficient documentation

## 2023-10-25 DIAGNOSIS — E8809 Other disorders of plasma-protein metabolism, not elsewhere classified: Secondary | ICD-10-CM

## 2023-10-25 DIAGNOSIS — N1831 Chronic kidney disease, stage 3a: Secondary | ICD-10-CM | POA: Diagnosis not present

## 2023-10-25 LAB — COMPREHENSIVE METABOLIC PANEL WITH GFR
ALT: 21 U/L (ref 0–44)
AST: 18 U/L (ref 15–41)
Albumin: 3.5 g/dL (ref 3.5–5.0)
Alkaline Phosphatase: 88 U/L (ref 38–126)
Anion gap: 15 (ref 5–15)
BUN: 34 mg/dL — ABNORMAL HIGH (ref 8–23)
CO2: 19 mmol/L — ABNORMAL LOW (ref 22–32)
Calcium: 10.4 mg/dL — ABNORMAL HIGH (ref 8.9–10.3)
Chloride: 95 mmol/L — ABNORMAL LOW (ref 98–111)
Creatinine, Ser: 1.95 mg/dL — ABNORMAL HIGH (ref 0.61–1.24)
GFR, Estimated: 35 mL/min — ABNORMAL LOW (ref >60)
Glucose, Bld: 117 mg/dL — ABNORMAL HIGH (ref 70–99)
Potassium: 3.5 mmol/L (ref 3.5–5.1)
Sodium: 129 mmol/L — ABNORMAL LOW (ref 135–145)
Total Bilirubin: 0.8 mg/dL (ref 0.0–1.2)
Total Protein: 6.4 g/dL — ABNORMAL LOW (ref 6.5–8.1)

## 2023-10-25 LAB — CBC WITH DIFFERENTIAL/PLATELET
Abs Immature Granulocytes: 0.15 10*3/uL — ABNORMAL HIGH (ref 0.00–0.07)
Basophils Absolute: 0.1 10*3/uL (ref 0.0–0.1)
Basophils Relative: 1 %
Eosinophils Absolute: 0.1 10*3/uL (ref 0.0–0.5)
Eosinophils Relative: 2 %
HCT: 41.7 % (ref 39.0–52.0)
Hemoglobin: 13.2 g/dL (ref 13.0–17.0)
Immature Granulocytes: 2 %
Lymphocytes Relative: 17 %
Lymphs Abs: 1.3 10*3/uL (ref 0.7–4.0)
MCH: 29.8 pg (ref 26.0–34.0)
MCHC: 31.7 g/dL (ref 30.0–36.0)
MCV: 94.1 fL (ref 80.0–100.0)
Monocytes Absolute: 0.6 10*3/uL (ref 0.1–1.0)
Monocytes Relative: 8 %
Neutro Abs: 5.1 10*3/uL (ref 1.7–7.7)
Neutrophils Relative %: 70 %
Platelets: 213 10*3/uL (ref 150–400)
RBC: 4.43 MIL/uL (ref 4.22–5.81)
RDW: 15.9 % — ABNORMAL HIGH (ref 11.5–15.5)
WBC: 7.3 10*3/uL (ref 4.0–10.5)
nRBC: 0 % (ref 0.0–0.2)

## 2023-10-25 LAB — TYPE AND SCREEN
ABO/RH(D): O NEG
Antibody Screen: NEGATIVE

## 2023-10-25 LAB — ETHANOL: Alcohol, Ethyl (B): 26 mg/dL — ABNORMAL HIGH (ref <15)

## 2023-10-25 MED ORDER — TAMSULOSIN HCL 0.4 MG PO CAPS
0.4000 mg | ORAL_CAPSULE | Freq: Every day | ORAL | Status: DC
  Administered 2023-10-25 – 2023-10-26 (×2): 0.4 mg via ORAL
  Filled 2023-10-25 (×2): qty 1

## 2023-10-25 MED ORDER — ONDANSETRON HCL 4 MG/2ML IJ SOLN: 4.0000 mg | Freq: Four times a day (QID) | INTRAMUSCULAR | Status: DC | PRN

## 2023-10-25 MED ORDER — ALBUTEROL SULFATE (2.5 MG/3ML) 0.083% IN NEBU: 2.5000 mg | INHALATION_SOLUTION | Freq: Four times a day (QID) | RESPIRATORY_TRACT | Status: DC | PRN

## 2023-10-25 MED ORDER — ALBUTEROL SULFATE HFA 108 (90 BASE) MCG/ACT IN AERS: 2.0000 | INHALATION_SPRAY | Freq: Four times a day (QID) | RESPIRATORY_TRACT | Status: DC | PRN

## 2023-10-25 MED ORDER — SODIUM CHLORIDE 0.9 % IV BOLUS
1000.0000 mL | Freq: Once | INTRAVENOUS | Status: AC
  Administered 2023-10-25: 1000 mL via INTRAVENOUS

## 2023-10-25 MED ORDER — UMECLIDINIUM BROMIDE 62.5 MCG/ACT IN AEPB
1.0000 | INHALATION_SPRAY | Freq: Every day | RESPIRATORY_TRACT | Status: DC
  Administered 2023-10-26 – 2023-10-27 (×2): 1 via RESPIRATORY_TRACT
  Filled 2023-10-25: qty 7

## 2023-10-25 MED ORDER — APIXABAN 5 MG PO TABS
5.0000 mg | ORAL_TABLET | Freq: Two times a day (BID) | ORAL | Status: DC
  Administered 2023-10-25 – 2023-10-27 (×4): 5 mg via ORAL
  Filled 2023-10-25 (×4): qty 1

## 2023-10-25 MED ORDER — ACETAMINOPHEN 650 MG RE SUPP: 650.0000 mg | Freq: Four times a day (QID) | RECTAL | Status: DC | PRN

## 2023-10-25 MED ORDER — FLUTICASONE FUROATE-VILANTEROL 200-25 MCG/ACT IN AEPB
1.0000 | INHALATION_SPRAY | Freq: Every day | RESPIRATORY_TRACT | Status: DC
  Administered 2023-10-26 – 2023-10-27 (×2): 1 via RESPIRATORY_TRACT
  Filled 2023-10-25: qty 28

## 2023-10-25 MED ORDER — ONDANSETRON HCL 4 MG PO TABS: 4.0000 mg | ORAL_TABLET | Freq: Four times a day (QID) | ORAL | Status: DC | PRN

## 2023-10-25 MED ORDER — ATORVASTATIN CALCIUM 40 MG PO TABS
40.0000 mg | ORAL_TABLET | Freq: Every day | ORAL | Status: DC
  Administered 2023-10-25 – 2023-10-27 (×3): 40 mg via ORAL
  Filled 2023-10-25 (×3): qty 1

## 2023-10-25 MED ORDER — ACETAMINOPHEN 325 MG PO TABS
650.0000 mg | ORAL_TABLET | Freq: Four times a day (QID) | ORAL | Status: DC | PRN
  Administered 2023-10-25 – 2023-10-26 (×2): 650 mg via ORAL
  Filled 2023-10-25 (×2): qty 2

## 2023-10-25 MED ORDER — TIOTROPIUM BROMIDE MONOHYDRATE 2.5 MCG/ACT IN AERS: 2.0000 | INHALATION_SPRAY | Freq: Every day | RESPIRATORY_TRACT | Status: DC

## 2023-10-25 NOTE — ED Provider Notes (Signed)
  Physical Exam  BP (!) 119/48   Pulse (!) 44   Temp 97.7 F (36.5 C) (Oral)   Resp (!) 22   SpO2 100%   Physical Exam  Procedures  Procedures  ED Course / MDM   Clinical Course as of 10/26/23 0055  Wed Oct 25, 2023  1530 I, Rafael Bun DO, am transitioning care of this patient to the oncoming provider pending remainder of laboratory workup, imaging reports, reevaluation and disposition [MP]  1633 Creatinine(!): 1.95 [JL]    Clinical Course User Index [JL] Rosealee Concha, MD [MP] Sallyanne Creamer, DO   Medical Decision Making Amount and/or Complexity of Data Reviewed Labs: ordered. Decision-making details documented in ED Course. Radiology: ordered.  Risk Decision regarding hospitalization.   49M presenting with a fall on Eliquis , drinks 5 beers daily, Waiting on CT imaging.     CT C/A/P: IMPRESSION:  1. No acute traumatic injury to the chest, abdomen or pelvis.  2. Multiple other nonacute observations, as described above.    Aortic Atherosclerosis (ICD10-I70.0).   CT Head and Cervical Spine: IMPRESSION:  1. No acute intracranial abnormality.  2. No acute osseous injury or traumatic listhesis of the cervical  spine.   Pt with an AKI. Administered a 500cc NaCl bolus. HR on tele noted to be bradycardic. EKG obtained.  His last EF was notably normal with diastolic dysfunction on echocardiogram in August 2024.  EKG: Atrial fibrillation with slow ventricular response, heart rate 48, no acute ischemic changes noted.  The patient describes multiple near syncopal episodes and unsteadiness of gait causing recent falls.  He walks with a rollator.  He has not had a full syncopal episode.  He denies any chest pain or shortness of breath.  His trauma workup is overall reassuring. Considered EtOH as the etiology of his falls versus slow atrial fibrillation resulting in near syncopal episodes versus baseline gait instability.  Cardiology consulted for further evaluation.  In  the setting of the patient's worsening renal function, medicine consulted for admission.     Rosealee Concha, MD 10/25/23 1635    Rosealee Concha, MD 10/26/23 [phone]

## 2023-10-25 NOTE — H&P (Signed)
 History and Physical    Francis Cardenas:[id number] DOB: April 26, 1951 DOA: 10/25/2023  I have briefly reviewed the patient's prior medical records in [facility]  PCP: Margaret Sharp, PA-C  Patient coming from: home  Chief Complaint: Weakness, recurrent falls  HPI: Francis Cardenas is a 73 y.o. male with medical history significant of daily EtOH use, BPH, prostate cancer, chronic diastolic CHF, COPD, DM2, obesity comes into the hospital with complaints of several falls over the last 3 to 4 days.  He denies any chest pain or palpitations, no lightheadedness or dizziness but gets weak to the point that falls down.  Last fall was this morning.  He tells me he drinks beer every day, 8-10, starts drinking in the morning and today he was done with his fifth one, got to his walker and that is when he fell.  He states that he has been drinking for a long time ever since he was 71, never experienced any withdrawals or any issues with it.  He denies any recent fever or chills.  Reports recent hospitalization for fluid overload, currently he does not feel like he has significant fluid overload and feels close to baseline.  ED Course: In the emergency room he is afebrile, soft blood pressure initially and rates on the monitor were in the 30s at 1 point, showing slow A-fib.  Cardiology consulted by EDP.  Blood work reveals a sodium of 129, creatinine 1.9, chloride at 95.  CBC is unremarkable.  Alcohol  level is borderline at 26.  CT C-spine, head, abdomen without any trauma.  Review of Systems: All systems reviewed, and apart from HPI, all negative  Past Medical History:  Diagnosis Date   Alcohol  dependence (HCC)    BPH (benign prostatic hyperplasia)    Cancer (HCC)    prostate   CHF (congestive heart failure) (HCC)    COPD (chronic obstructive pulmonary disease) (HCC)    Diabetes mellitus without complication (HCC)    Dyspnea on exertion 07/14/2022   Elevated troponin 04/06/2019   Fall at home,  initial encounter 09/14/2022   Hypercholesteremia    Hypertension    OSA on CPAP     Past Surgical History:  Procedure Laterality Date   ACHILLES TENDON REPAIR Left    CARDIAC CATHETERIZATION     CARPAL TUNNEL RELEASE Right    KNEE SURGERY Left    RIGHT/LEFT HEART CATH AND CORONARY ANGIOGRAPHY N/A 04/09/2019   Procedure: RIGHT/LEFT HEART CATH AND CORONARY ANGIOGRAPHY;  Surgeon: [place], Peter M, Cardenas;  Location: [facility];  Service: Cardiovascular;  Laterality: N/A;   TONSILLECTOMY     WRIST FRACTURE SURGERY Left      reports that he quit smoking about 45 years ago. His smoking use included cigarettes. He started smoking about 58 years ago. He has a 13 pack-year smoking history. He has been exposed to tobacco smoke. He has never used smokeless tobacco. He reports current alcohol  use of about 8.0 standard drinks of alcohol  per week. He reports that he does not use drugs.  Allergies  Allergen Reactions   Oxcarbazepine  Other (See Comments)    Dizziness/ lightheaded   Zolpidem  Nausea Only    Family History  Problem Relation Age of Onset   CAD Mother    Leukemia Mother    Heart attack Father    Heart attack Brother    Obesity Brother     Prior to Admission medications   Medication Sig Start Date End Date Taking? Authorizing  Provider  acetaminophen  (TYLENOL ) 325 MG tablet Take 2 tablets (650 mg total) by mouth every 6 (six) hours as needed. 09/27/21   Francis Cardenas  albuterol  (PROVENTIL  HFA;VENTOLIN  HFA) 108 (90 BASE) MCG/ACT inhaler Inhale 2 puffs into the lungs every 6 (six) hours as needed for wheezing or shortness of breath.    Provider, Historical, Cardenas  allopurinol  (ZYLOPRIM ) 100 MG tablet Take 100 mg by mouth daily.    Provider, Historical, Cardenas  allopurinol  (ZYLOPRIM ) 300 MG tablet Take 300 mg by mouth daily. 11/25/22   Provider, Historical, Cardenas  ALPRAZolam  (XANAX ) 1 MG tablet Take 1 tablet (1 mg total) by mouth at bedtime. 12/30/22   Deforest Fast, Cardenas  amLODipine   (NORVASC ) 10 MG tablet Take 10 mg by mouth daily. 11/25/22   Provider, Historical, Cardenas  apixaban  (ELIQUIS ) 5 MG TABS tablet Take 1 tablet (5 mg total) by mouth 2 (two) times daily. 02/28/22   Francis Cardenas  atorvastatin  (LIPITOR) 80 MG tablet Take 40 mg by mouth daily with supper. NOTIFY CLINIC FOR UNUSUAL MUSCLE ACHES OR WEAKNESS, BROWN URINE, BLOOD IN URINE, YELLOWING OF SKIN/WHITES OF EYES, PERSISTENT NAUSEA/VOMITING/DIARRHEA; REPLACES PRAVASTATIN  FOR CHOLESTEROL NOTIFY CLINIC FOR UNUSUAL MUSCLE ACHES OR WEAKNESS, BROWN URINE, BLOOD IN URINE, YELLOWING OF SKIN/WHITES OF EYES, PERSISTENT NAUSEA/VOMITING/DIARRHEA; REPLACES PRAVASTATIN     Provider, Historical, Cardenas  carvedilol  (COREG ) 6.25 MG tablet Take 6.25 mg by mouth daily. Hold dose for systolic blood pressure below 161 or pulse less than 50 04/08/22   Provider, Historical, Cardenas  empagliflozin  (JARDIANCE ) 25 MG TABS tablet Take 1 tablet (25 mg total) by mouth daily. 01/01/23   Francis Cardenas  fluticasone -salmeterol (WIXELA INHUB) 250-50 MCG/ACT AEPB Inhale 1 puff into the lungs in the morning and at bedtime. 09/05/22   Quillian Brunt, Cardenas  gabapentin  (NEURONTIN ) 300 MG capsule Take 1 capsule (300 mg total) by mouth 2 (two) times daily. Patient taking differently: Take 300 mg by mouth 3 (three) times daily. 01/01/23   Francis Cardenas  guaifenesin  (HUMIBID E) 400 MG TABS tablet Take 400 mg by mouth daily. 11/22/22   Provider, Historical, Cardenas  hydrALAZINE  (APRESOLINE ) 25 MG tablet Take 25 mg by mouth daily.    Provider, Historical, Cardenas  Magnesium  Oxide 420 MG TABS Take 420 mg by mouth daily with supper.    Provider, Historical, Cardenas  omeprazole  (PRILOSEC) 20 MG capsule Take 20 mg by mouth daily with supper.    Provider, Historical, Cardenas  prednisoLONE  acetate (PRED FORTE ) 1 % ophthalmic suspension Place 1 drop into both eyes daily. 07/10/23   Provider, Historical, Cardenas  tamsulosin  (FLOMAX ) 0.4 MG CAPS capsule Take 0.4 mg by mouth daily after supper.     Provider, Historical, Cardenas  Tiotropium Bromide  Monohydrate (SPIRIVA  RESPIMAT) 2.5 MCG/ACT AERS Inhale 2 puffs into the lungs daily. 09/05/22   Quillian Brunt, Cardenas  torsemide  (DEMADEX ) 20 MG tablet Take 20 mg by mouth 2 (two) times daily. Hold dose for systolic blood pressure less than 100 10/21/21   Provider, Historical, Cardenas  vitamin B-12 (CYANOCOBALAMIN ) 500 MCG tablet Take 500 mcg by mouth daily with supper.    Provider, Historical, Cardenas    Physical Exam: Vitals:   10/25/23 1315 10/25/23 1330 10/25/23 1430 10/25/23 1610  BP: (!) 119/48   (!) 124/52  Pulse: (!) 44  (!) 55 (!) 57  Resp: (!) 22  12 16   Temp:  97.7 F (36.5 C)    TempSrc:  Oral    SpO2:  100%  100% 100%   Constitutional: NAD, calm, comfortable Eyes: PERRL, lids and conjunctivae normal ENMT: Mucous membranes are moist.  Neck: normal, supple Respiratory: clear to auscultation bilaterally, no wheezing, no crackles. Normal respiratory effort.  Cardiovascular: Regular rate and rhythm, no murmurs / rubs / gallops. 1+ edema bilateral LE, R>L Abdomen: no tenderness, no masses palpated. Bowel sounds positive.  Musculoskeletal: no clubbing / cyanosis. Normal muscle tone.  Skin: no rashes, lesions, ulcers. No induration Neurologic: non focal   Labs on Admission: I have personally reviewed following labs and imaging studies  CBC: Recent Labs  Lab 10/25/23 1339  WBC 7.3  NEUTROABS 5.1  HGB 13.2  HCT 41.7  MCV 94.1  PLT 213   Basic Metabolic Panel: Recent Labs  Lab 10/25/23 1339  NA 129*  K 3.5  CL 95*  CO2 19*  GLUCOSE 117*  BUN 34*  CREATININE 1.95*  CALCIUM  10.4*   Liver Function Tests: Recent Labs  Lab 10/25/23 1339  AST 18  ALT 21  ALKPHOS 88  BILITOT 0.8  PROT 6.4*  ALBUMIN 3.5   Coagulation Profile: No results for input(s): "INR", "PROTIME" in the last 168 hours. BNP (last 3 results) No results for input(s): "PROBNP" in the last 8760 hours. CBG: No results for input(s): "GLUCAP" in the last 168  hours. Thyroid  Function Tests: No results for input(s): "TSH", "T4TOTAL", "FREET4", "T3FREE", "THYROIDAB" in the last 72 hours. Urine analysis:    Component Value Date/Time   COLORURINE YELLOW 05/01/2023 1521   APPEARANCEUR CLEAR 05/01/2023 1521   LABSPEC 1.005 05/01/2023 1521   PHURINE 5.0 05/01/2023 1521   GLUCOSEU 50 (A) 05/01/2023 1521   HGBUR NEGATIVE 05/01/2023 1521   BILIRUBINUR NEGATIVE 05/01/2023 1521   KETONESUR NEGATIVE 05/01/2023 1521   PROTEINUR NEGATIVE 05/01/2023 1521   UROBILINOGEN 0.2 03/24/2013 1255   NITRITE NEGATIVE 05/01/2023 1521   LEUKOCYTESUR NEGATIVE 05/01/2023 1521     Radiological Exams on Admission: CT CERVICAL SPINE WO CONTRAST Result Date: 10/25/2023 CLINICAL DATA:  Head trauma, moderate-severe; Polytrauma, blunt. EXAM: CT HEAD WITHOUT CONTRAST CT CERVICAL SPINE WITHOUT CONTRAST TECHNIQUE: Multidetector CT imaging of the head and cervical spine was performed following the standard protocol without intravenous contrast. Multiplanar CT image reconstructions of the cervical spine were also generated. RADIATION DOSE REDUCTION: This exam was performed according to the departmental dose-optimization program which includes automated exposure control, adjustment of the mA and/or kV according to patient size and/or use of iterative reconstruction technique. COMPARISON:  CT scan head and cervical spine from 05/01/2023. FINDINGS: CT HEAD FINDINGS Brain: No evidence of acute infarction, hemorrhage, hydrocephalus, extra-axial collection or mass lesion/mass effect. There is bilateral periventricular hypodensity, which is non-specific but most likely seen in the settings of microvascular ischemic changes. Moderate in extent. Otherwise normal appearance of brain parenchyma. Ventricles are normal. Cerebral volume is age appropriate. Vascular: No hyperdense vessel or unexpected calcification. Intracranial arteriosclerosis. Skull: Normal. Negative for fracture or focal lesion.  Sinuses/Orbits: No acute finding. Other: Visualized mastoid air cells are unremarkable. No mastoid effusion. CT CERVICAL SPINE FINDINGS Alignment: There is loss of cervical lordosis, which may be positional or due to muscle spasm. This examination does not assess for ligamentous injury or stability. Skull base and vertebrae: No acute fracture. No primary bone lesion or focal pathologic process. Soft tissues and spinal canal: No prevertebral fluid or swelling. No visible canal hematoma. Disc levels: There are moderate multilevel degenerative changes of the cervical spine characterized by reduced intervertebral disc height, facet arthropathy and  marginal osteophyte formation. Upper chest: Negative. IMPRESSION: 1. No acute intracranial abnormality. 2. No acute osseous injury or traumatic listhesis of the cervical spine. Electronically Signed   By: Beula Brunswick M.D.   On: 10/25/2023 15:36   CT HEAD WO CONTRAST Result Date: 10/25/2023 CLINICAL DATA:  Head trauma, moderate-severe; Polytrauma, blunt. EXAM: CT HEAD WITHOUT CONTRAST CT CERVICAL SPINE WITHOUT CONTRAST TECHNIQUE: Multidetector CT imaging of the head and cervical spine was performed following the standard protocol without intravenous contrast. Multiplanar CT image reconstructions of the cervical spine were also generated. RADIATION DOSE REDUCTION: This exam was performed according to the departmental dose-optimization program which includes automated exposure control, adjustment of the mA and/or kV according to patient size and/or use of iterative reconstruction technique. COMPARISON:  CT scan head and cervical spine from 05/01/2023. FINDINGS: CT HEAD FINDINGS Brain: No evidence of acute infarction, hemorrhage, hydrocephalus, extra-axial collection or mass lesion/mass effect. There is bilateral periventricular hypodensity, which is non-specific but most likely seen in the settings of microvascular ischemic changes. Moderate in extent. Otherwise normal  appearance of brain parenchyma. Ventricles are normal. Cerebral volume is age appropriate. Vascular: No hyperdense vessel or unexpected calcification. Intracranial arteriosclerosis. Skull: Normal. Negative for fracture or focal lesion. Sinuses/Orbits: No acute finding. Other: Visualized mastoid air cells are unremarkable. No mastoid effusion. CT CERVICAL SPINE FINDINGS Alignment: There is loss of cervical lordosis, which may be positional or due to muscle spasm. This examination does not assess for ligamentous injury or stability. Skull base and vertebrae: No acute fracture. No primary bone lesion or focal pathologic process. Soft tissues and spinal canal: No prevertebral fluid or swelling. No visible canal hematoma. Disc levels: There are moderate multilevel degenerative changes of the cervical spine characterized by reduced intervertebral disc height, facet arthropathy and marginal osteophyte formation. Upper chest: Negative. IMPRESSION: 1. No acute intracranial abnormality. 2. No acute osseous injury or traumatic listhesis of the cervical spine. Electronically Signed   By: Beula Brunswick M.D.   On: 10/25/2023 15:36   CT CHEST ABDOMEN PELVIS WO CONTRAST Result Date: 10/25/2023 CLINICAL DATA:  Polytrauma, blunt.  Fall. EXAM: CT CHEST, ABDOMEN AND PELVIS WITHOUT CONTRAST TECHNIQUE: Multidetector CT imaging of the chest, abdomen and pelvis was performed following the standard protocol without IV contrast. RADIATION DOSE REDUCTION: This exam was performed according to the departmental dose-optimization program which includes automated exposure control, adjustment of the mA and/or kV according to patient size and/or use of iterative reconstruction technique. COMPARISON:  CT scan chest, abdomen and pelvis from 05/01/2023. FINDINGS: CT CHEST FINDINGS Cardiovascular: Normal cardiac size. No pericardial effusion. No aortic aneurysm. There are coronary artery calcifications, in keeping with coronary artery disease. There  are also mild peripheral atherosclerotic vascular calcifications of thoracic aorta and its major branches. Mediastinum/Nodes: Visualized thyroid  gland appears grossly unremarkable. No solid / cystic mediastinal masses. The esophagus is nondistended precluding optimal assessment. No mediastinal or axillary lymphadenopathy by size criteria. Evaluation of bilateral hila is limited due to lack on intravenous contrast: however, no large hilar lymphadenopathy identified. Lungs/Pleura: The central tracheo-bronchial tree is patent. No mass or consolidation. No pleural effusion or pneumothorax. No suspicious lung nodules. Musculoskeletal: The visualized soft tissues of the chest wall are grossly unremarkable. No suspicious osseous lesions. There are mild multilevel degenerative changes in the visualized spine. Multiple old, healed, right-sided rib fractures noted. CT ABDOMEN PELVIS FINDINGS Hepatobiliary: The liver is normal in size. Non-cirrhotic configuration. No suspicious mass. No intrahepatic or extrahepatic bile duct dilation. Very small volume noncalcified gallstone  noted without imaging signs of acute cholecystitis. Normal gallbladder wall thickness. No pericholecystic inflammatory changes. Pancreas: Unremarkable. No pancreatic ductal dilatation or surrounding inflammatory changes. Spleen: Within normal limits. No focal lesion. Adrenals/Urinary Tract: Adrenal glands are unremarkable. No suspicious renal mass within the limitations of this unenhanced exam. No nephroureterolithiasis or obstructive uropathy. Unremarkable urinary bladder. Stomach/Bowel: No disproportionate dilation of the small or large bowel loops. No evidence of abnormal bowel wall thickening or inflammatory changes. The appendix is unremarkable. There are multiple diverticula mainly in the sigmoid colon, without imaging signs of diverticulitis. Vascular/Lymphatic: No ascites or pneumoperitoneum. No abdominal or pelvic lymphadenopathy, by size  criteria. No aneurysmal dilation of the major abdominal arteries. There are mild peripheral atherosclerotic vascular calcifications of the aorta and its major branches. Reproductive: Normal size prostate. Symmetric seminal vesicles. Fiducial markers noted in the prostate. Other: There is a tiny fat containing umbilical hernia. The soft tissues and abdominal wall are otherwise unremarkable. Musculoskeletal: No suspicious osseous lesions. There are mild - moderate multilevel degenerative changes in the visualized spine. IMPRESSION: 1. No acute traumatic injury to the chest, abdomen or pelvis. 2. Multiple other nonacute observations, as described above. Aortic Atherosclerosis (ICD10-I70.0). Electronically Signed   By: Beula Brunswick M.D.   On: 10/25/2023 15:19   DG Chest Port 1 View Result Date: 10/25/2023 CLINICAL DATA:  Trauma. EXAM: PORTABLE CHEST 1 VIEW COMPARISON:  Chest radiograph dated 08/26/2023 FINDINGS: No focal consolidation, pleural effusion or pneumothorax. There is mild cardiomegaly and mild vascular congestion. No acute osseous pathology. IMPRESSION: 1. No focal consolidation. 2. Mild cardiomegaly and mild vascular congestion. Electronically Signed   By: Angus Bark M.D.   On: 10/25/2023 13:52    EKG: Independently reviewed. Slow a fib  Assessment/Plan Principal problem Recurrent falls -fairly new, he has been drinking heavily however seems to have tolerated it in the past without falls.  Was found to have very slow A-fib with rates in the 30s and cardiology consulted, potentially contributing to his falls - Admit on telemetry, cardiology to see, appreciate input - Hold home carvedilol  and antihypertensives -PT/OT consult, obtain orthostatic vital signs  Active problems Hyponatremia-with hypochloremia, we wonder whether he is slightly on the dry side /intravascularly depleted.  He does have lower extremity pitting edema but tells me this is chronic.  It is more on the right leg but he  had a fracture there and it has always more swollen  A-fib, with slow rates-Coreg  on hold as above.  Continue anticoagulation with Eliquis   Acute kidney injury on chronic kidney disease stage IIIb -baseline creatinine around 1.5, currently at 1.9.  Has received IV fluids in the ED, repeat renal function in the morning -hold home diuretics  Chronic diastolic CHF-most recent 2D echo August 2024 shows LVEF 60 to 65%, no WMA, mild LVH.  RV was mildly reduced  Alcohol  use-patient drinks 8-10 beers a day, starts in the morning.  He has never had withdrawals.  Monitor  Obesity, morbid-BMI above 40  BPH-continue home medication  OSA on CPAP-continue nightly CPAP  DM2-resume Jardiance  once home med rec is done  Lab Results  Component Value Date   HGBA1C 5.7 (H) 08/26/2023   DVT prophylaxis: Eliquis   Code Status: Full code  Family Communication: no family at bedside  Disposition Plan: home when ready  Bed Type: progressive Consults called: cardiology   Obs/Inp: obs  Kathlen Para, MD, PhD [facility]  Contact via [email or web address]  10/25/2023, 5:13 PM

## 2023-10-25 NOTE — Consult Note (Addendum)
 Cardiology Consultation   Patient ID: Francis Cardenas MRN: [id number]; DOB: Dec 17, 1950  Admit date: 10/25/2023 Date of Consult: 10/25/2023  PCP:  Margaret Sharp, PA-C   [facility] Providers Cardiologist:  Sheryle Donning, MD        Patient Profile: Francis Cardenas is a 73 y.o. male with a hx of permanent A-fib, OSA on CPAP, CKD stage III, HFpEF, hypertension, DM2, GERD and EtOH abuse who is being seen 10/25/2023 for the evaluation of bradycardia at the request of Dr. Adrain Alar.  History of Present Illness: Francis Cardenas is a morbidly obese 73 year old male with past medical history of permanent A-fib, OSA on CPAP, CKD stage III, HFpEF, hypertension, DM2, GERD and EtOH abuse.  Cardiac catheterization performed 911/17/2020 showed normal coronary arteries.  Last echocardiogram obtained on 12/25/2022 showed EF 60 to 65%, mild LVH, mildly reduced RVEF, RVSP 40.5 mmHg, moderate LAE, small pericardial effusion, trivial MR.  Patient was recently admitted again in April 2025 with acute on chronic diastolic heart failure.  He was diuresed 10 L and his weight dropped by 17 pounds.  Patient was discharged on 20 mg twice a day of torsemide .  Creatinine at the time of discharge was 1.46.  The weight was down to 302 pounds.  Lab work obtained by PCP on 09/06/2023 however showed creatinine jumped to 3.05.  Patient was seen by Neomi Banks, NP on/20 05/2023, at the time my interview, he has been able to avoid drinking alcohol  for more than 10 days.  However based on the note, patient felt increased frustration without alcohol  and plan to resume drinking.  Blood work obtained on the same day showed creatinine has improved to 1.58.  Since the last office visit, patient resumed drinking roughly 8-10 cans of 12 ounces cans of beers on a daily basis.  He says he usually drink alcohol  as a way to celebrate, he will drink more alcohol  on a good day.  He does not feel depressed.  Since he resumed drinking, he has  restarted using his rollator to get around.  Since Sunday, he has fallen 3 different times.  When asked if he was feeling dizzy or he lost balance, he think the fall was due to a combination of both.  He occasionally has twinges in the left chest that only last 1 second.  He denies any shortness of breath.  He has trace amount of lower extremity edema.  He has been compliant with his heart failure medication.  After a fall this morning, he eventually sought medical attention at [facility], ED.  While in the emergency room, there was report of A-fib with bradycardia down to the 30s, cardiology service was consulted for bradycardia and frequent falls.  On further review of cardiac telemetry, his heart rate is mostly in the 50s-60s, the only time telemetry recorded heart rate down to the 30s is due to missed counting of the QRS complex.  He has occasional change in the morphology with widening of QRS but no tachycardia.   Past Medical History:  Diagnosis Date   Alcohol  dependence (HCC)    BPH (benign prostatic hyperplasia)    Cancer (HCC)    prostate   CHF (congestive heart failure) (HCC)    COPD (chronic obstructive pulmonary disease) (HCC)    Diabetes mellitus without complication (HCC)    Dyspnea on exertion 07/14/2022   Elevated troponin 04/06/2019   Fall at home, initial encounter 09/14/2022   Hypercholesteremia    Hypertension  OSA on CPAP     Past Surgical History:  Procedure Laterality Date   ACHILLES TENDON REPAIR Left    CARDIAC CATHETERIZATION     CARPAL TUNNEL RELEASE Right    KNEE SURGERY Left    RIGHT/LEFT HEART CATH AND CORONARY ANGIOGRAPHY N/A 04/09/2019   Procedure: RIGHT/LEFT HEART CATH AND CORONARY ANGIOGRAPHY;  Surgeon: [place], Peter M, MD;  Location: [facility] INVASIVE CV LAB;  Service: Cardiovascular;  Laterality: N/A;   TONSILLECTOMY     WRIST FRACTURE SURGERY Left      Home Medications:  Prior to Admission medications   Medication Sig Start Date End Date Taking?  Authorizing Provider  acetaminophen  (TYLENOL ) 325 MG tablet Take 2 tablets (650 mg total) by mouth every 6 (six) hours as needed. 09/27/21  Yes Russella Courts A, DO  albuterol  (PROVENTIL  HFA;VENTOLIN  HFA) 108 (90 BASE) MCG/ACT inhaler Inhale 2 puffs into the lungs every 6 (six) hours as needed for wheezing or shortness of breath.   Yes [provider]  allopurinol  (ZYLOPRIM ) 100 MG tablet Take 100 mg by mouth daily.   Yes [provider]  allopurinol  (ZYLOPRIM ) 300 MG tablet Take 300 mg by mouth daily. 11/25/22  Yes [provider]  ALPRAZolam  (XANAX ) 1 MG tablet Take 1 tablet (1 mg total) by mouth at bedtime. Patient taking differently: Take 1 mg by mouth at bedtime as needed for anxiety or sleep. 12/30/22  Yes Deforest Fast, MD  amLODipine  (NORVASC ) 10 MG tablet Take 10 mg by mouth every evening. 11/25/22  Yes [provider]  apixaban  (ELIQUIS ) 5 MG TABS tablet Take 1 tablet (5 mg total) by mouth 2 (two) times daily. 02/28/22  Yes Sheryle Donning, MD  atorvastatin  (LIPITOR) 80 MG tablet Take 40 mg by mouth daily with supper. NOTIFY CLINIC FOR UNUSUAL MUSCLE ACHES OR WEAKNESS, BROWN URINE, BLOOD IN URINE, YELLOWING OF SKIN/WHITES OF EYES, PERSISTENT NAUSEA/VOMITING/DIARRHEA; REPLACES PRAVASTATIN  FOR CHOLESTEROL NOTIFY CLINIC FOR UNUSUAL MUSCLE ACHES OR WEAKNESS, BROWN URINE, BLOOD IN URINE, YELLOWING OF SKIN/WHITES OF EYES, PERSISTENT NAUSEA/VOMITING/DIARRHEA; REPLACES PRAVASTATIN    Yes [provider]  carvedilol  (COREG ) 6.25 MG tablet Take 6.25 mg by mouth daily. Hold dose for systolic blood pressure below 161 or pulse less than 50 04/08/22  Yes [provider]  empagliflozin  (JARDIANCE ) 25 MG TABS tablet Take 1 tablet (25 mg total) by mouth daily. 01/01/23  Yes Amin, Ankit C, MD  fluticasone -salmeterol (WIXELA INHUB) 250-50 MCG/ACT AEPB Inhale 1 puff into the lungs in the morning and at bedtime. 09/05/22  Yes Quillian Brunt, MD  gabapentin   (NEURONTIN ) 300 MG capsule Take 1 capsule (300 mg total) by mouth 2 (two) times daily. Patient taking differently: Take 300 mg by mouth 3 (three) times daily. 01/01/23  Yes Amin, Ankit C, MD  guaifenesin  (HUMIBID E) 400 MG TABS tablet Take 400 mg by mouth daily. 11/22/22  Yes [provider]  hydrALAZINE  (APRESOLINE ) 25 MG tablet Take 25 mg by mouth daily.   Yes [provider]  lisinopril  (ZESTRIL ) 20 MG tablet Take 20 mg by mouth daily. 10/20/23  Yes [provider]  Magnesium  Oxide 420 MG TABS Take 420 mg by mouth daily with supper.   Yes [provider]  omeprazole  (PRILOSEC) 20 MG capsule Take 20 mg by mouth daily with supper.   Yes [provider]  prednisoLONE  acetate (PRED FORTE ) 1 % ophthalmic suspension Place 1 drop into both eyes daily. 07/10/23  Yes [provider]  spironolactone  (ALDACTONE ) 25 MG tablet Take  25 mg by mouth daily.   Yes [provider]  tamsulosin  (FLOMAX ) 0.4 MG CAPS capsule Take 0.4 mg by mouth daily after supper.   Yes [provider]  Tiotropium Bromide  Monohydrate (SPIRIVA  RESPIMAT) 2.5 MCG/ACT AERS Inhale 2 puffs into the lungs daily. Patient taking differently: Inhale 1 puff into the lungs daily. 09/05/22  Yes Quillian Brunt, MD  torsemide  (DEMADEX ) 20 MG tablet Take 20 mg by mouth 2 (two) times daily. Hold dose for systolic blood pressure less than 100 10/21/21  Yes [provider]  vitamin B-12 (CYANOCOBALAMIN ) 500 MCG tablet Take 500 mcg by mouth every evening.   Yes [provider]    Scheduled Meds:  Continuous Infusions:  PRN Meds:   Allergies:    Allergies  Allergen Reactions   Oxcarbazepine  Other (See Comments)    Dizziness/ lightheaded   Zolpidem  Nausea Only    Social History:   Social History   Socioeconomic History   Marital status: Married    Spouse name: Margo   Number of children: 1   Years of education: Not on file   Highest education level:  Not on file  Occupational History   Occupation: retired  Tobacco Use   Smoking status: Former    Current packs/day: 0.00    Average packs/day: 1 pack/day for 13.0 years (13.0 ttl pk-yrs)    Types: Cigarettes    Start date: 45    Quit date: 1980    Years since quitting: 45.4    Passive exposure: Past   Smokeless tobacco: Never  Vaping Use   Vaping status: Never Used  Substance and Sexual Activity   Alcohol  use: Yes    Alcohol /week: 8.0 standard drinks of alcohol     Types: 8 Cans of beer per week    Comment: 8 beers per day.   Drug use: No   Sexual activity: Not Currently  Other Topics Concern   Not on file  Social History Narrative   Not on file   Social Drivers of Health   Financial Resource Strain: Low Risk  (12/26/2022)   Overall Financial Resource Strain (CARDIA)    Difficulty of Paying Living Expenses: Not very hard  Food Insecurity: Low Risk  (09/06/2023)   Received from [facility]   Hunger Vital Sign    Worried About Running Out of Food in the Last Year: Never true    Ran Out of Food in the Last Year: Never true  Transportation Needs: No Transportation Needs (09/06/2023)   Received from [facility]    In the past 12 months, has lack of reliable transportation kept you from medical appointments, meetings, work or from getting things needed for daily living? : No  Physical Activity: Not on file  Stress: Not on file  Social Connections: Socially Isolated (08/26/2023)   Social Connection and Isolation Panel [NHANES]    Frequency of Communication with Friends and Family: Never    Frequency of Social Gatherings with Friends and Family: Never    Attends Religious Services: Never    Database administrator or Organizations: No    Attends Banker Meetings: Never    Marital Status: Married  Catering manager Violence: Not At Risk (08/26/2023)   Humiliation, Afraid, Rape, and Kick questionnaire    Fear of Current or Ex-Partner: No     Emotionally Abused: No    Physically Abused: No    Sexually Abused: No    Family History:  Family History  Problem Relation Age of Onset   CAD Mother    Leukemia Mother    Heart attack Father    Heart attack Brother    Obesity Brother      ROS:  Please see the history of present illness.   All other ROS reviewed and negative.     Physical Exam/Data: Vitals:   10/25/23 1315 10/25/23 1330 10/25/23 1430 10/25/23 1610  BP: (!) 119/48   (!) 124/52  Pulse: (!) 44  (!) 55 (!) 57  Resp: (!) 22  12 16   Temp:  97.7 F (36.5 C)    TempSrc:  Oral    SpO2: 100%  100% 100%   No intake or output data in the 24 hours ending 10/25/23 1803    09/11/2023   10:57 AM 08/31/2023    3:57 AM 08/30/2023    6:16 AM  Last 3 Weights  Weight (lbs) 303 lb 297 lb 9.9 oz 302 lb 11.1 oz  Weight (kg) 137.44 kg 135 kg 137.3 kg     There is no height or weight on file to calculate BMI.  General:  Well nourished, well developed, in no acute distress HEENT: normal Neck: no JVD Vascular: No carotid bruits; Distal pulses 2+ bilaterally Cardiac:  normal S1, S2; RRR; no murmur  Lungs:  clear to auscultation bilaterally, no wheezing, rhonchi or rales  Abd: soft, nontender, no hepatomegaly  Ext: no edema Musculoskeletal:  No deformities, BUE and BLE strength normal and equal Skin: warm and dry  Neuro:  CNs 2-12 intact, no focal abnormalities noted Psych:  Normal affect   EKG:  The EKG was personally reviewed and demonstrates: Atrial fibrillation, no significant ST changes. Telemetry:  Telemetry was personally reviewed and demonstrates: Atrial fibrillation, heart rate mostly in the 50s-60s, occasional reported heart rate of down to the 30s, however on closer examination, QRS complex was not picked up by the telemetry.  Relevant CV Studies:  Echo 12/2022 1. Left ventricular ejection fraction, by estimation, is 60 to 65%. The  left ventricle has normal function. The left ventricle has no regional  wall  motion abnormalities. There is mild left ventricular hypertrophy.  Left ventricular diastolic parameters  are indeterminate.   2. Right ventricular systolic function is mildly reduced. The right  ventricular size is moderately enlarged. There is mildly elevated  pulmonary artery systolic pressure. The estimated right ventricular  systolic pressure is 40.5 mmHg.   3. Left atrial size was moderately dilated.   4. Right atrial size was mildly dilated.   5. A small pericardial effusion is present. The pericardial effusion is  anterior to the right ventricle.   6. The mitral valve is grossly normal. Trivial mitral valve  regurgitation. No evidence of mitral stenosis.   7. The aortic valve is tricuspid. Aortic valve regurgitation is not  visualized. Aortic valve sclerosis/calcification is present, without any  evidence of aortic stenosis.   8. The inferior vena cava is dilated in size with >50% respiratory  variability, suggesting right atrial pressure of 8 mmHg.   Laboratory Data: High Sensitivity Troponin:  No results for input(s): "TROPONINIHS" in the last 720 hours.   Chemistry Recent Labs  Lab 10/25/23 1339  NA 129*  K 3.5  CL 95*  CO2 19*  GLUCOSE 117*  BUN 34*  CREATININE 1.95*  CALCIUM  10.4*  GFRNONAA 35*  ANIONGAP 15    Recent Labs  Lab 10/25/23 1339  PROT 6.4*  ALBUMIN 3.5  AST 18  ALT 21  ALKPHOS 88  BILITOT 0.8   Lipids No results for input(s): "CHOL", "TRIG", "HDL", "LABVLDL", "LDLCALC", "CHOLHDL" in the last 168 hours.  Hematology Recent Labs  Lab 10/25/23 1339  WBC 7.3  RBC 4.43  HGB 13.2  HCT 41.7  MCV 94.1  MCH 29.8  MCHC 31.7  RDW 15.9*  PLT 213   Thyroid  No results for input(s): "TSH", "FREET4" in the last 168 hours.  BNPNo results for input(s): "BNP", "PROBNP" in the last 168 hours.  DDimer No results for input(s): "DDIMER" in the last 168 hours.  Radiology/Studies:  CT CERVICAL SPINE WO CONTRAST Result Date: 10/25/2023 CLINICAL DATA:   Head trauma, moderate-severe; Polytrauma, blunt. EXAM: CT HEAD WITHOUT CONTRAST CT CERVICAL SPINE WITHOUT CONTRAST TECHNIQUE: Multidetector CT imaging of the head and cervical spine was performed following the standard protocol without intravenous contrast. Multiplanar CT image reconstructions of the cervical spine were also generated. RADIATION DOSE REDUCTION: This exam was performed according to the departmental dose-optimization program which includes automated exposure control, adjustment of the mA and/or kV according to patient size and/or use of iterative reconstruction technique. COMPARISON:  CT scan head and cervical spine from 05/01/2023. FINDINGS: CT HEAD FINDINGS Brain: No evidence of acute infarction, hemorrhage, hydrocephalus, extra-axial collection or mass lesion/mass effect. There is bilateral periventricular hypodensity, which is non-specific but most likely seen in the settings of microvascular ischemic changes. Moderate in extent. Otherwise normal appearance of brain parenchyma. Ventricles are normal. Cerebral volume is age appropriate. Vascular: No hyperdense vessel or unexpected calcification. Intracranial arteriosclerosis. Skull: Normal. Negative for fracture or focal lesion. Sinuses/Orbits: No acute finding. Other: Visualized mastoid air cells are unremarkable. No mastoid effusion. CT CERVICAL SPINE FINDINGS Alignment: There is loss of cervical lordosis, which may be positional or due to muscle spasm. This examination does not assess for ligamentous injury or stability. Skull base and vertebrae: No acute fracture. No primary bone lesion or focal pathologic process. Soft tissues and spinal canal: No prevertebral fluid or swelling. No visible canal hematoma. Disc levels: There are moderate multilevel degenerative changes of the cervical spine characterized by reduced intervertebral disc height, facet arthropathy and marginal osteophyte formation. Upper chest: Negative. IMPRESSION: 1. No acute  intracranial abnormality. 2. No acute osseous injury or traumatic listhesis of the cervical spine. Electronically Signed   By: Beula Brunswick M.D.   On: 10/25/2023 15:36   CT HEAD WO CONTRAST Result Date: 10/25/2023 CLINICAL DATA:  Head trauma, moderate-severe; Polytrauma, blunt. EXAM: CT HEAD WITHOUT CONTRAST CT CERVICAL SPINE WITHOUT CONTRAST TECHNIQUE: Multidetector CT imaging of the head and cervical spine was performed following the standard protocol without intravenous contrast. Multiplanar CT image reconstructions of the cervical spine were also generated. RADIATION DOSE REDUCTION: This exam was performed according to the departmental dose-optimization program which includes automated exposure control, adjustment of the mA and/or kV according to patient size and/or use of iterative reconstruction technique. COMPARISON:  CT scan head and cervical spine from 05/01/2023. FINDINGS: CT HEAD FINDINGS Brain: No evidence of acute infarction, hemorrhage, hydrocephalus, extra-axial collection or mass lesion/mass effect. There is bilateral periventricular hypodensity, which is non-specific but most likely seen in the settings of microvascular ischemic changes. Moderate in extent. Otherwise normal appearance of brain parenchyma. Ventricles are normal. Cerebral volume is age appropriate. Vascular: No hyperdense vessel or unexpected calcification. Intracranial arteriosclerosis. Skull: Normal. Negative for fracture or focal lesion. Sinuses/Orbits: No acute finding. Other: Visualized mastoid air cells are unremarkable. No mastoid effusion. CT CERVICAL SPINE FINDINGS Alignment: There is  loss of cervical lordosis, which may be positional or due to muscle spasm. This examination does not assess for ligamentous injury or stability. Skull base and vertebrae: No acute fracture. No primary bone lesion or focal pathologic process. Soft tissues and spinal canal: No prevertebral fluid or swelling. No visible canal hematoma. Disc  levels: There are moderate multilevel degenerative changes of the cervical spine characterized by reduced intervertebral disc height, facet arthropathy and marginal osteophyte formation. Upper chest: Negative. IMPRESSION: 1. No acute intracranial abnormality. 2. No acute osseous injury or traumatic listhesis of the cervical spine. Electronically Signed   By: Beula Brunswick M.D.   On: 10/25/2023 15:36   CT CHEST ABDOMEN PELVIS WO CONTRAST Result Date: 10/25/2023 CLINICAL DATA:  Polytrauma, blunt.  Fall. EXAM: CT CHEST, ABDOMEN AND PELVIS WITHOUT CONTRAST TECHNIQUE: Multidetector CT imaging of the chest, abdomen and pelvis was performed following the standard protocol without IV contrast. RADIATION DOSE REDUCTION: This exam was performed according to the departmental dose-optimization program which includes automated exposure control, adjustment of the mA and/or kV according to patient size and/or use of iterative reconstruction technique. COMPARISON:  CT scan chest, abdomen and pelvis from 05/01/2023. FINDINGS: CT CHEST FINDINGS Cardiovascular: Normal cardiac size. No pericardial effusion. No aortic aneurysm. There are coronary artery calcifications, in keeping with coronary artery disease. There are also mild peripheral atherosclerotic vascular calcifications of thoracic aorta and its major branches. Mediastinum/Nodes: Visualized thyroid  gland appears grossly unremarkable. No solid / cystic mediastinal masses. The esophagus is nondistended precluding optimal assessment. No mediastinal or axillary lymphadenopathy by size criteria. Evaluation of bilateral hila is limited due to lack on intravenous contrast: however, no large hilar lymphadenopathy identified. Lungs/Pleura: The central tracheo-bronchial tree is patent. No mass or consolidation. No pleural effusion or pneumothorax. No suspicious lung nodules. Musculoskeletal: The visualized soft tissues of the chest wall are grossly unremarkable. No suspicious osseous  lesions. There are mild multilevel degenerative changes in the visualized spine. Multiple old, healed, right-sided rib fractures noted. CT ABDOMEN PELVIS FINDINGS Hepatobiliary: The liver is normal in size. Non-cirrhotic configuration. No suspicious mass. No intrahepatic or extrahepatic bile duct dilation. Very small volume noncalcified gallstone noted without imaging signs of acute cholecystitis. Normal gallbladder wall thickness. No pericholecystic inflammatory changes. Pancreas: Unremarkable. No pancreatic ductal dilatation or surrounding inflammatory changes. Spleen: Within normal limits. No focal lesion. Adrenals/Urinary Tract: Adrenal glands are unremarkable. No suspicious renal mass within the limitations of this unenhanced exam. No nephroureterolithiasis or obstructive uropathy. Unremarkable urinary bladder. Stomach/Bowel: No disproportionate dilation of the small or large bowel loops. No evidence of abnormal bowel wall thickening or inflammatory changes. The appendix is unremarkable. There are multiple diverticula mainly in the sigmoid colon, without imaging signs of diverticulitis. Vascular/Lymphatic: No ascites or pneumoperitoneum. No abdominal or pelvic lymphadenopathy, by size criteria. No aneurysmal dilation of the major abdominal arteries. There are mild peripheral atherosclerotic vascular calcifications of the aorta and its major branches. Reproductive: Normal size prostate. Symmetric seminal vesicles. Fiducial markers noted in the prostate. Other: There is a tiny fat containing umbilical hernia. The soft tissues and abdominal wall are otherwise unremarkable. Musculoskeletal: No suspicious osseous lesions. There are mild - moderate multilevel degenerative changes in the visualized spine. IMPRESSION: 1. No acute traumatic injury to the chest, abdomen or pelvis. 2. Multiple other nonacute observations, as described above. Aortic Atherosclerosis (ICD10-I70.0). Electronically Signed   By: Beula Brunswick  M.D.   On: 10/25/2023 15:19   DG Chest Port 1 View Result Date: 10/25/2023 CLINICAL DATA:  Trauma.  EXAM: PORTABLE CHEST 1 VIEW COMPARISON:  Chest radiograph dated 08/26/2023 FINDINGS: No focal consolidation, pleural effusion or pneumothorax. There is mild cardiomegaly and mild vascular congestion. No acute osseous pathology. IMPRESSION: 1. No focal consolidation. 2. Mild cardiomegaly and mild vascular congestion. Electronically Signed   By: Angus Bark M.D.   On: 10/25/2023 13:52     Assessment and Plan: Frequent falls  - 3 episode of fall since Sunday.  Symptom also occurred while walking.  Patient cannot tell me accurately whether or not he had true dizziness prior to the event or simply lost balance.  He has resumed drinking and since then he has restarted using his rollator to get around.  Blood work shows he is likely dehydrated drinking alcohol  and has hyponatremia which contribute to the weakness.  There is some concern of possible bradycardia in the emergency room, on close examination of telemetry, he is in A-fib with a heart rate in the 50s-60s.  Although there was a few documentation of heart rate in the 30s, however this is due to the telemetry not picking up the QRS complex.  I did not see any true evidence of bradycardia.  Permanent atrial fibrillation: Continue Eliquis .  Emphasized on the need to cut back on drinking.  Acute on chronic renal insufficiency: Creatinine trended up from 1.5 recently to 1.9.  Patient started drinking early in the morning around 7 AM.  Suspect he is somewhat dry.  Hyponatremia: Related to potomania.  EtOH abuse: Need to cut back on drinking.  Currently drinking 8 to 10 cans of 12 ounces of alcohol .   Risk Assessment/Risk Scores:    CHA2DS2-VASc Score = 4   This indicates a 4.8% annual risk of stroke. The patient's score is based upon: CHF History: 1 HTN History: 1 Diabetes History: 1 Stroke History: 0 Vascular Disease History: 0 Age Score:  1 Gender Score: 0   For questions or updates, please contact [facility] Please consult [email or web address] for contact info under   Mikael Albright, [place]  10/25/2023 6:03 PM  Patient seen and examined, note reviewed with the signed Advanced Practice Provider. I personally reviewed laboratory data, imaging studies and relevant notes. I independently examined the patient and formulated the important aspects of the plan. I have personally discussed the plan with the patient and/or family. Comments or changes to the note/plan are indicated below.   Patient seen examined at his bedside.  He was awake in bed when I arrived.  Currently complaints of headaches.   GEN:  obese,  well developed in no acute distress HEENT: Mucous membranes moist, good dentition NECK: No JVD; No carotid bruits LYMPHATICS: No lymphadenopathy CARDIAC: S1S2 noted, RRR, no murmurs, rubs, gallops RESPIRATORY:  Clear to auscultation without rales, wheezing or rhonchi  ABDOMEN: Soft, non-tender, non-distended, bowel sounds noted, no guarding EXTREMITIES:No cyanosis, no cyanosis, no clubbing MUSCULOSKELETAL: No deformity  SKIN: Warm and dry NEUROLOGIC:  Alert and oriented x 3, nonfocal PSYCHIATRIC:  Normal affect, good insight    Frequent/Recurrent falls suspected to be mechanical falls  Permanent atrial fibrillation  Acute on chronic renal insufficiency Hyponatremia  ETOH Abuse    He has had multiple falls which is suspected to be mechanical falls that may be associated with loss of balance from excessive etoh intake. He had a CT of the head which is negative.  His heart rate is between high 60s and 50s with occassional PVCs which are not being  coutned. I do not believe symptomatic PVC is  playing a role here we will continue to monitor.   Continue eliquis  for now, but if he continues to have these episodes there may need to be a long term discussion for safety of being on anticoagulation.   He needs to be on  CIWA protocol but will defer to the primary team   We will continue to follow    Vence Lalor DO, MS [facility] Attending Cardiologist [facility]  [address] [place], [place] [address] [phone] Website: [email or web address]

## 2023-10-25 NOTE — Progress Notes (Signed)
Placed patient on CPAP for the night via auto-mode.  

## 2023-10-25 NOTE — ED Notes (Signed)
 Got patient on the monitor did manual blood pressure patient is resting with call bell in reach

## 2023-10-25 NOTE — ED Triage Notes (Signed)
 [name] for multiple falls on thinners over the last 3-4 days, fell today and hit head on counter. Patient had 5 - 12oz cans of beer today. Took eliquis  today.

## 2023-10-25 NOTE — ED Provider Notes (Signed)
 [place] EMERGENCY DEPARTMENT AT [facility] Provider Note   CSN: [id number] Arrival date & time: 10/25/23  1301     History  Chief Complaint  Patient presents with   Francis Cardenas Francis Cardenas is a 73 y.o. male.  With a history of HFpEF, COPD, type 2 diabetes and alcohol  use disorder presents to the ED after another fall.  Multiple falls at home recently.  Today he was walking with his rolling walker when he fell forward in his kitchen on his way to get another beer.  Reports that he did strike his head on the counter.  Uncertain if he lost consciousness.  Reports drinking 512 ounce cans of beer today.  Has not been evaluated for other falls   Fall       Home Medications Prior to Admission medications   Medication Sig Start Date End Date Taking? Authorizing Provider  acetaminophen  (TYLENOL ) 325 MG tablet Take 2 tablets (650 mg total) by mouth every 6 (six) hours as needed. 09/27/21   Teddi Favors, DO  albuterol  (PROVENTIL  HFA;VENTOLIN  HFA) 108 (90 BASE) MCG/ACT inhaler Inhale 2 puffs into the lungs every 6 (six) hours as needed for wheezing or shortness of breath.    [provider]  allopurinol  (ZYLOPRIM ) 100 MG tablet Take 100 mg by mouth daily.    [provider]  allopurinol  (ZYLOPRIM ) 300 MG tablet Take 300 mg by mouth daily. 11/25/22   [provider]  ALPRAZolam  (XANAX ) 1 MG tablet Take 1 tablet (1 mg total) by mouth at bedtime. 12/30/22   Deforest Fast, MD  amLODipine  (NORVASC ) 10 MG tablet Take 10 mg by mouth daily. 11/25/22   [provider]  apixaban  (ELIQUIS ) 5 MG TABS tablet Take 1 tablet (5 mg total) by mouth 2 (two) times daily. 02/28/22   Sheryle Donning, MD  atorvastatin  (LIPITOR) 80 MG tablet Take 40 mg by mouth daily with supper. NOTIFY CLINIC FOR UNUSUAL MUSCLE ACHES OR WEAKNESS, BROWN URINE, BLOOD IN URINE, YELLOWING OF SKIN/WHITES OF EYES, PERSISTENT NAUSEA/VOMITING/DIARRHEA; REPLACES PRAVASTATIN  FOR CHOLESTEROL  NOTIFY CLINIC FOR UNUSUAL MUSCLE ACHES OR WEAKNESS, BROWN URINE, BLOOD IN URINE, YELLOWING OF SKIN/WHITES OF EYES, PERSISTENT NAUSEA/VOMITING/DIARRHEA; REPLACES PRAVASTATIN     [provider]  carvedilol  (COREG ) 6.25 MG tablet Take 6.25 mg by mouth daily. Hold dose for systolic blood pressure below 829 or pulse less than 50 04/08/22   [provider]  empagliflozin  (JARDIANCE ) 25 MG TABS tablet Take 1 tablet (25 mg total) by mouth daily. 01/01/23   Amin, Ankit C, MD  fluticasone -salmeterol (WIXELA INHUB) 250-50 MCG/ACT AEPB Inhale 1 puff into the lungs in the morning and at bedtime. 09/05/22   Quillian Brunt, MD  gabapentin  (NEURONTIN ) 300 MG capsule Take 1 capsule (300 mg total) by mouth 2 (two) times daily. Patient taking differently: Take 300 mg by mouth 3 (three) times daily. 01/01/23   Amin, Ankit C, MD  guaifenesin  (HUMIBID E) 400 MG TABS tablet Take 400 mg by mouth daily. 11/22/22   [provider]  hydrALAZINE  (APRESOLINE ) 25 MG tablet Take 25 mg by mouth daily.    [provider]  Magnesium  Oxide 420 MG TABS Take 420 mg by mouth daily with supper.    [provider]  omeprazole  (PRILOSEC) 20 MG capsule Take 20 mg by mouth daily with supper.    [provider]  prednisoLONE  acetate (PRED FORTE ) 1 % ophthalmic suspension Place 1 drop into both eyes daily. 07/10/23  [provider]  tamsulosin  (FLOMAX ) 0.4 MG CAPS capsule Take 0.4 mg by mouth daily after supper.    [provider]  Tiotropium Bromide  Monohydrate (SPIRIVA  RESPIMAT) 2.5 MCG/ACT AERS Inhale 2 puffs into the lungs daily. 09/05/22   Quillian Brunt, MD  torsemide  (DEMADEX ) 20 MG tablet Take 20 mg by mouth 2 (two) times daily. Hold dose for systolic blood pressure less than 100 10/21/21   [provider]  vitamin B-12 (CYANOCOBALAMIN ) 500 MCG tablet Take 500 mcg by mouth daily with supper.    [provider]      Allergies    Oxcarbazepine  and  Zolpidem     Review of Systems   Review of Systems  Physical Exam Updated Vital Signs BP (!) 124/52   Pulse (!) 57   Temp 97.7 F (36.5 C) (Oral)   Resp 16   SpO2 100%  Physical Exam Vitals and nursing note reviewed.  HENT:     Head: Normocephalic and atraumatic.  Eyes:     Extraocular Movements: Extraocular movements intact.     Pupils: Pupils are equal, round, and reactive to light.  Cardiovascular:     Rate and Rhythm: Normal rate and regular rhythm.  Pulmonary:     Effort: Pulmonary effort is normal.     Breath sounds: Normal breath sounds.  Abdominal:     Palpations: Abdomen is soft.     Tenderness: There is abdominal tenderness.     Comments: Right upper quadrant right lower quadrant tenderness to palpation  Musculoskeletal:     Cervical back: Neck supple. No tenderness.     Comments: Tenderness over mid thoracic region but no step-off deformities No midline tenderness step-off deformities of lumbar region  Skin:    General: Skin is warm and dry.  Neurological:     Mental Status: He is alert.  Psychiatric:        Mood and Affect: Mood normal.     ED Results / Procedures / Treatments   Labs (all labs ordered are listed, but only abnormal results are displayed) Labs Reviewed  CBC WITH DIFFERENTIAL/PLATELET - Abnormal; Notable for the following components:      Result Value   RDW 15.9 (*)    Abs Immature Granulocytes 0.15 (*)    All other components within normal limits  COMPREHENSIVE METABOLIC PANEL WITH GFR - Abnormal; Notable for the following components:   Sodium 129 (*)    Chloride 95 (*)    CO2 19 (*)    Glucose, Bld 117 (*)    BUN 34 (*)    Creatinine, Ser 1.95 (*)    Calcium  10.4 (*)    Total Protein 6.4 (*)    GFR, Estimated 35 (*)    All other components within normal limits  ETHANOL - Abnormal; Notable for the following components:   Alcohol , Ethyl (B) 26 (*)    All other components within normal limits  TYPE AND SCREEN    EKG EKG  Interpretation Date/Time:  Wednesday October 25 2023 16:10:59 EDT Ventricular Rate:  96 PR Interval:    QRS Duration:  131 QT Interval:  440 QTC Calculation: 461 R Axis:   77  Text Interpretation: Atrial fibrillation Paired ventricular premature complexes Nonspecific intraventricular conduction delay Confirmed by Rosealee Concha ([id number]) on 10/25/2023 4:15:36 PM  Radiology CT CERVICAL SPINE WO CONTRAST Result Date: 10/25/2023 CLINICAL DATA:  Head trauma, moderate-severe; Polytrauma, blunt. EXAM: CT HEAD WITHOUT CONTRAST CT CERVICAL SPINE WITHOUT CONTRAST TECHNIQUE: Multidetector CT imaging of  the head and cervical spine was performed following the standard protocol without intravenous contrast. Multiplanar CT image reconstructions of the cervical spine were also generated. RADIATION DOSE REDUCTION: This exam was performed according to the departmental dose-optimization program which includes automated exposure control, adjustment of the mA and/or kV according to patient size and/or use of iterative reconstruction technique. COMPARISON:  CT scan head and cervical spine from 05/01/2023. FINDINGS: CT HEAD FINDINGS Brain: No evidence of acute infarction, hemorrhage, hydrocephalus, extra-axial collection or mass lesion/mass effect. There is bilateral periventricular hypodensity, which is non-specific but most likely seen in the settings of microvascular ischemic changes. Moderate in extent. Otherwise normal appearance of brain parenchyma. Ventricles are normal. Cerebral volume is age appropriate. Vascular: No hyperdense vessel or unexpected calcification. Intracranial arteriosclerosis. Skull: Normal. Negative for fracture or focal lesion. Sinuses/Orbits: No acute finding. Other: Visualized mastoid air cells are unremarkable. No mastoid effusion. CT CERVICAL SPINE FINDINGS Alignment: There is loss of cervical lordosis, which may be positional or due to muscle spasm. This examination does not assess for ligamentous  injury or stability. Skull base and vertebrae: No acute fracture. No primary bone lesion or focal pathologic process. Soft tissues and spinal canal: No prevertebral fluid or swelling. No visible canal hematoma. Disc levels: There are moderate multilevel degenerative changes of the cervical spine characterized by reduced intervertebral disc height, facet arthropathy and marginal osteophyte formation. Upper chest: Negative. IMPRESSION: 1. No acute intracranial abnormality. 2. No acute osseous injury or traumatic listhesis of the cervical spine. Electronically Signed   By: Beula Brunswick M.D.   On: 10/25/2023 15:36   CT HEAD WO CONTRAST Result Date: 10/25/2023 CLINICAL DATA:  Head trauma, moderate-severe; Polytrauma, blunt. EXAM: CT HEAD WITHOUT CONTRAST CT CERVICAL SPINE WITHOUT CONTRAST TECHNIQUE: Multidetector CT imaging of the head and cervical spine was performed following the standard protocol without intravenous contrast. Multiplanar CT image reconstructions of the cervical spine were also generated. RADIATION DOSE REDUCTION: This exam was performed according to the departmental dose-optimization program which includes automated exposure control, adjustment of the mA and/or kV according to patient size and/or use of iterative reconstruction technique. COMPARISON:  CT scan head and cervical spine from 05/01/2023. FINDINGS: CT HEAD FINDINGS Brain: No evidence of acute infarction, hemorrhage, hydrocephalus, extra-axial collection or mass lesion/mass effect. There is bilateral periventricular hypodensity, which is non-specific but most likely seen in the settings of microvascular ischemic changes. Moderate in extent. Otherwise normal appearance of brain parenchyma. Ventricles are normal. Cerebral volume is age appropriate. Vascular: No hyperdense vessel or unexpected calcification. Intracranial arteriosclerosis. Skull: Normal. Negative for fracture or focal lesion. Sinuses/Orbits: No acute finding. Other:  Visualized mastoid air cells are unremarkable. No mastoid effusion. CT CERVICAL SPINE FINDINGS Alignment: There is loss of cervical lordosis, which may be positional or due to muscle spasm. This examination does not assess for ligamentous injury or stability. Skull base and vertebrae: No acute fracture. No primary bone lesion or focal pathologic process. Soft tissues and spinal canal: No prevertebral fluid or swelling. No visible canal hematoma. Disc levels: There are moderate multilevel degenerative changes of the cervical spine characterized by reduced intervertebral disc height, facet arthropathy and marginal osteophyte formation. Upper chest: Negative. IMPRESSION: 1. No acute intracranial abnormality. 2. No acute osseous injury or traumatic listhesis of the cervical spine. Electronically Signed   By: Beula Brunswick M.D.   On: 10/25/2023 15:36   CT CHEST ABDOMEN PELVIS WO CONTRAST Result Date: 10/25/2023 CLINICAL DATA:  Polytrauma, blunt.  Fall. EXAM: CT CHEST, ABDOMEN  AND PELVIS WITHOUT CONTRAST TECHNIQUE: Multidetector CT imaging of the chest, abdomen and pelvis was performed following the standard protocol without IV contrast. RADIATION DOSE REDUCTION: This exam was performed according to the departmental dose-optimization program which includes automated exposure control, adjustment of the mA and/or kV according to patient size and/or use of iterative reconstruction technique. COMPARISON:  CT scan chest, abdomen and pelvis from 05/01/2023. FINDINGS: CT CHEST FINDINGS Cardiovascular: Normal cardiac size. No pericardial effusion. No aortic aneurysm. There are coronary artery calcifications, in keeping with coronary artery disease. There are also mild peripheral atherosclerotic vascular calcifications of thoracic aorta and its major branches. Mediastinum/Nodes: Visualized thyroid  gland appears grossly unremarkable. No solid / cystic mediastinal masses. The esophagus is nondistended precluding optimal  assessment. No mediastinal or axillary lymphadenopathy by size criteria. Evaluation of bilateral hila is limited due to lack on intravenous contrast: however, no large hilar lymphadenopathy identified. Lungs/Pleura: The central tracheo-bronchial tree is patent. No mass or consolidation. No pleural effusion or pneumothorax. No suspicious lung nodules. Musculoskeletal: The visualized soft tissues of the chest wall are grossly unremarkable. No suspicious osseous lesions. There are mild multilevel degenerative changes in the visualized spine. Multiple old, healed, right-sided rib fractures noted. CT ABDOMEN PELVIS FINDINGS Hepatobiliary: The liver is normal in size. Non-cirrhotic configuration. No suspicious mass. No intrahepatic or extrahepatic bile duct dilation. Very small volume noncalcified gallstone noted without imaging signs of acute cholecystitis. Normal gallbladder wall thickness. No pericholecystic inflammatory changes. Pancreas: Unremarkable. No pancreatic ductal dilatation or surrounding inflammatory changes. Spleen: Within normal limits. No focal lesion. Adrenals/Urinary Tract: Adrenal glands are unremarkable. No suspicious renal mass within the limitations of this unenhanced exam. No nephroureterolithiasis or obstructive uropathy. Unremarkable urinary bladder. Stomach/Bowel: No disproportionate dilation of the small or large bowel loops. No evidence of abnormal bowel wall thickening or inflammatory changes. The appendix is unremarkable. There are multiple diverticula mainly in the sigmoid colon, without imaging signs of diverticulitis. Vascular/Lymphatic: No ascites or pneumoperitoneum. No abdominal or pelvic lymphadenopathy, by size criteria. No aneurysmal dilation of the major abdominal arteries. There are mild peripheral atherosclerotic vascular calcifications of the aorta and its major branches. Reproductive: Normal size prostate. Symmetric seminal vesicles. Fiducial markers noted in the prostate.  Other: There is a tiny fat containing umbilical hernia. The soft tissues and abdominal wall are otherwise unremarkable. Musculoskeletal: No suspicious osseous lesions. There are mild - moderate multilevel degenerative changes in the visualized spine. IMPRESSION: 1. No acute traumatic injury to the chest, abdomen or pelvis. 2. Multiple other nonacute observations, as described above. Aortic Atherosclerosis (ICD10-I70.0). Electronically Signed   By: Beula Brunswick M.D.   On: 10/25/2023 15:19   DG Chest Port 1 View Result Date: 10/25/2023 CLINICAL DATA:  Trauma. EXAM: PORTABLE CHEST 1 VIEW COMPARISON:  Chest radiograph dated 08/26/2023 FINDINGS: No focal consolidation, pleural effusion or pneumothorax. There is mild cardiomegaly and mild vascular congestion. No acute osseous pathology. IMPRESSION: 1. No focal consolidation. 2. Mild cardiomegaly and mild vascular congestion. Electronically Signed   By: Angus Bark M.D.   On: 10/25/2023 13:52    Procedures Ultrasound ED FAST  Date/Time: 10/25/2023 1:40 PM  Performed by: Sallyanne Creamer, DO Authorized by: Sallyanne Creamer, DO  Procedure details:    Indications: blunt abdominal trauma and blunt chest trauma       Assess for:  Hemothorax, intra-abdominal fluid, pericardial effusion and pneumothorax    Technique:  Abdominal, cardiac and chest    Images: archived      Abdominal findings:  L kidney:  Visualized   R kidney:  Visualized   Liver:  Visualized    Bladder:  Visualized, Foley catheter not visualized   Hepatorenal space visualized: identified     Splenorenal space: identified     Rectovesical free fluid: not identified     Splenorenal free fluid: not identified     Hepatorenal space free fluid: not identified   Cardiac findings:    Heart:  Visualized   Wall motion: identified     Pericardial effusion: not identified   Chest findings:    L lung sliding: identified     R lung sliding: identified     Fluid in thorax: not  identified   Comments:     E-FAST negative     Medications Ordered in ED Medications  sodium chloride  0.9 % bolus 1,000 mL (1,000 mLs Intravenous New Bag/Given 10/25/23 1601)    ED Course/ Medical Decision Making/ A&P Clinical Course as of 10/25/23 1642  Wed Oct 25, 2023  1530 I, Rafael Bun DO, am transitioning care of this patient to the oncoming provider pending remainder of laboratory workup, imaging reports, reevaluation and disposition [MP]  1633 Creatinine(!): 1.95 [JL]    Clinical Course User Index [JL] Rosealee Concha, MD [MP] Sallyanne Creamer, DO                                 Medical Decision Making 73 year old male with history as above presents after fall.  Level 2 trauma as he is on Eliquis .  Last dose was this morning.  Positive EtOH.  Fall from standing while ambulating with rolling walker at home.  Positive head trauma no LOC.  Awake alert cooperative hemodynamically stable.  E-FAST is negative.  Given there is EtOH on board and he is on Eliquis  will obtain CT head C-spine chest abdomen pelvis to look for evidence of traumatic injury.  Will obtain trauma laboratory panel and continue to monitor  Amount and/or Complexity of Data Reviewed Labs: ordered. Decision-making details documented in ED Course. Radiology: ordered.  Risk Decision regarding hospitalization.           Final Clinical Impression(s) / ED Diagnoses Final diagnoses:  Fall, initial encounter  AKI (acute kidney injury) (HCC)  Atrial fibrillation with slow ventricular response [facility])    Rx / DC Orders ED Discharge Orders     None         Sallyanne Creamer, DO 10/25/23 1642

## 2023-10-25 NOTE — ED Notes (Signed)
 Patient transported to CT

## 2023-10-26 DIAGNOSIS — I4891 Unspecified atrial fibrillation: Secondary | ICD-10-CM | POA: Diagnosis not present

## 2023-10-26 DIAGNOSIS — N179 Acute kidney failure, unspecified: Secondary | ICD-10-CM | POA: Diagnosis not present

## 2023-10-26 DIAGNOSIS — R001 Bradycardia, unspecified: Secondary | ICD-10-CM | POA: Diagnosis not present

## 2023-10-26 DIAGNOSIS — W19XXXA Unspecified fall, initial encounter: Secondary | ICD-10-CM

## 2023-10-26 DIAGNOSIS — E871 Hypo-osmolality and hyponatremia: Secondary | ICD-10-CM

## 2023-10-26 DIAGNOSIS — I5032 Chronic diastolic (congestive) heart failure: Secondary | ICD-10-CM

## 2023-10-26 DIAGNOSIS — F1010 Alcohol abuse, uncomplicated: Secondary | ICD-10-CM

## 2023-10-26 DIAGNOSIS — E8809 Other disorders of plasma-protein metabolism, not elsewhere classified: Secondary | ICD-10-CM

## 2023-10-26 DIAGNOSIS — N1831 Chronic kidney disease, stage 3a: Secondary | ICD-10-CM

## 2023-10-26 LAB — COMPREHENSIVE METABOLIC PANEL WITH GFR
ALT: 21 U/L (ref 0–44)
AST: 19 U/L (ref 15–41)
Albumin: 2.8 g/dL — ABNORMAL LOW (ref 3.5–5.0)
Alkaline Phosphatase: 68 U/L (ref 38–126)
Anion gap: 8 (ref 5–15)
BUN: 30 mg/dL — ABNORMAL HIGH (ref 8–23)
CO2: 23 mmol/L (ref 22–32)
Calcium: 10.1 mg/dL (ref 8.9–10.3)
Chloride: 102 mmol/L (ref 98–111)
Creatinine, Ser: 1.91 mg/dL — ABNORMAL HIGH (ref 0.61–1.24)
GFR, Estimated: 36 mL/min — ABNORMAL LOW (ref >60)
Glucose, Bld: 139 mg/dL — ABNORMAL HIGH (ref 70–99)
Potassium: 3.3 mmol/L — ABNORMAL LOW (ref 3.5–5.1)
Sodium: 133 mmol/L — ABNORMAL LOW (ref 135–145)
Total Bilirubin: 0.8 mg/dL (ref 0.0–1.2)
Total Protein: 5.3 g/dL — ABNORMAL LOW (ref 6.5–8.1)

## 2023-10-26 LAB — CBC
HCT: 35.5 % — ABNORMAL LOW (ref 39.0–52.0)
Hemoglobin: 11.3 g/dL — ABNORMAL LOW (ref 13.0–17.0)
MCH: 29.8 pg (ref 26.0–34.0)
MCHC: 31.8 g/dL (ref 30.0–36.0)
MCV: 93.7 fL (ref 80.0–100.0)
Platelets: 185 10*3/uL (ref 150–400)
RBC: 3.79 MIL/uL — ABNORMAL LOW (ref 4.22–5.81)
RDW: 16 % — ABNORMAL HIGH (ref 11.5–15.5)
WBC: 5.2 10*3/uL (ref 4.0–10.5)
nRBC: 0 % (ref 0.0–0.2)

## 2023-10-26 LAB — ABO/RH: ABO/RH(D): O NEG

## 2023-10-26 MED ORDER — POTASSIUM CHLORIDE CRYS ER 20 MEQ PO TBCR
40.0000 meq | EXTENDED_RELEASE_TABLET | Freq: Once | ORAL | Status: AC
  Administered 2023-10-26: 40 meq via ORAL
  Filled 2023-10-26: qty 2

## 2023-10-26 NOTE — Evaluation (Signed)
 Occupational Therapy Evaluation Patient Details Name: Francis Cardenas MRN: [id number] DOB: [birth year] Today's Date: 10/26/2023   History of Present Illness   Patient is 73 y.o. male presents to hospital with c/o several falls over the last 3 to 4 days. Pt denies any CP or palpitations, no lightheadedness or dizziness, but reports weakness causing falls. ED w/u revealed soft BP and bradycardic. CT C-spine, head, abdomen without any trauma. PMH significant for permanent A-fib, OSA on CPAP, CKD stage III, HFpEF, hypertension, DM2, GERD, COPD, morbid obesity, and EtOH abuse.     Clinical Impressions At baseline, pt reports he is Independent to Mod I with ADLs, receives assist for meal prep and home management tasks, and performs functional mobility Mod I with a Rollator. However, pt also reports washing up at the sink "every few months" and presents with signs of skin irritation in skin fold in groin and under panus, as well as with poor insight into importance of participation in regular hygiene tasks and potential of poor hygiene and alcohol  use to lead to further health issues. Pt also presents with decreased activity tolerance, generalized B UE weakness, decreased cognition, impaired B UE sensation, decreased balance, and decreased safety and independence with functional tasks. Pt currently demonstrates ability to complete UB ADLs with Set up to Min assist, LB ADLs with Supervision to Mod assist, and functional transfers/mobility with a Rollator with Supervision to Contact guard assist. Pt participated well during session. Pt will benefit from acute skilled OT services to address deficits and increase safety and independence with functional tasks. Post acute discharge, OT recommends use of AE for LB bathing and toileting hygiene and increased assistance from family for improved participation in and thoroughness with LB ADLs. Pt may also be a good candidate for PACE or similar community-based program to  improve overall health management and increase socialization.      If plan is discharge home, recommend the following:   A little help with walking and/or transfers;A lot of help with bathing/dressing/bathroom;Assistance with cooking/housework;Assist for transportation;Help with stairs or ramp for entrance     Functional Status Assessment   Patient has had a recent decline in their functional status and demonstrates the ability to make significant improvements in function in a reasonable and predictable amount of time.     Equipment Recommendations   Tub/shower seat;Other (comment) (long handled sponge; toileting aide)     Recommendations for Other Services         Precautions/Restrictions   Precautions Precautions: Fall Restrictions Weight Bearing Restrictions Per Provider Order: No     Mobility Bed Mobility Overal bed mobility: Needs Assistance             General bed mobility comments: Pt sitting in recliner at beginning and end of session    Transfers Overall transfer level: Needs assistance Equipment used: Rollator (4 wheels) Transfers: Sit to/from Stand, Bed to chair/wheelchair/BSC Sit to Stand: Supervision     Step pivot transfers: Contact guard assist     General transfer comment: Supervision to CGA for safety      Balance Overall balance assessment: Needs assistance Sitting-balance support: No upper extremity supported, Feet supported Sitting balance-Leahy Scale: Good     Standing balance support: Single extremity supported, Bilateral upper extremity supported, During functional activity, Reliant on assistive device for balance Standing balance-Leahy Scale: Fair  ADL either performed or assessed with clinical judgement   ADL Overall ADL's : Needs assistance/impaired Eating/Feeding: Set up;Sitting Eating/Feeding Details (indicate cue type and reason): requires assistance for opening  containers Grooming: Set up;Sitting Grooming Details (indicate cue type and reason): requires assistance for opening containers Upper Body Bathing: Minimal assistance;Sitting;Cueing for compensatory techniques   Lower Body Bathing: Moderate assistance;Sitting/lateral leans;Sit to/from stand;Cueing for compensatory techniques   Upper Body Dressing : Contact guard assist;Sitting   Lower Body Dressing: Supervision/safety;Contact guard assist;Sitting/lateral leans;Sit to/from stand;Cueing for compensatory techniques Lower Body Dressing Details (indicate cue type and reason): with increased time and effort Toilet Transfer: Contact guard assist;Rollator (4 wheels);BSC/3in1 Toilet Transfer Details (indicate cue type and reason): simulated at recliner Toileting- Clothing Manipulation and Hygiene: Moderate assistance;Sit to/from stand;Cueing for compensatory techniques       Functional mobility during ADLs: Contact guard assist;Rollator (4 wheels) General ADL Comments: Pt with decreased activity tolerance and poor insight into importance of hygiene in supporting overall health. OT educated pt in role of hygiene in maintaining overall health and importance of thouroughly washing/drying self regularly and importance of thouroughness with toileting hygiene. OT initiated education in available AE to assist with LB bathing and toileting. Pt will benefit from reinforcement of education.     Vision Baseline Vision/History: 0 No visual deficits Ability to See in Adequate Light: 0 Adequate Patient Visual Report: No change from baseline       Perception         Praxis         Pertinent Vitals/Pain Pain Assessment Pain Assessment: No/denies pain     Extremity/Trunk Assessment Upper Extremity Assessment Upper Extremity Assessment: Right hand dominant;Generalized weakness;RUE deficits/detail;LUE deficits/detail RUE Deficits / Details: generalized weakness; history of carpal tunnel with surgical  repair RUE Sensation: history of peripheral neuropathy;decreased light touch RUE Coordination: decreased fine motor (mild) LUE Deficits / Details: generalized weakness; pt reports distant hx of wrist injury LUE Sensation: history of peripheral neuropathy;decreased light touch LUE Coordination: decreased fine motor (mild)   Lower Extremity Assessment Lower Extremity Assessment: Defer to PT evaluation   Cervical / Trunk Assessment Cervical / Trunk Assessment: Other exceptions Cervical / Trunk Exceptions: increased body habitus   Communication Communication Communication: No apparent difficulties   Cognition Arousal: Alert Behavior During Therapy: WFL for tasks assessed/performed Cognition: Cognition impaired, No family/caregiver present to determine baseline     Awareness: Intellectual awareness intact, Online awareness intact   Attention impairment (select first level of impairment): Alternating attention Executive functioning impairment (select all impairments): Reasoning, Problem solving OT - Cognition Comments: Pt AAOx4 and pleasant throughout session. Pt with poor insight into overall health, importance of hygiene, and potential of poor hygiene and alcohol  use to lead to further health issues.                 Following commands: Intact       Cueing  General Comments   Cueing Techniques: Verbal cues  HR in the 60s to 70s and O2 sat >/95% on RA throughout session.   Exercises     Shoulder Instructions      Home Living Family/patient expects to be discharged to:: Private residence Living Arrangements: Spouse/significant other Available Help at Discharge: Family;Available PRN/intermittently Type of Home: House Home Access: Ramped entrance;Other (comment) (1 step up from deck into home)     Home Layout: One level     Bathroom Shower/Tub: Producer, television/film/video: Standard (next to sink) Bathroom Accessibility: No   Home  Equipment: Rollator (4  wheels);Shower seat;Grab bars - tub/shower;Insurance risk surveyor (2 wheels);Adaptive equipment Adaptive Equipment: Long-handled shoe horn;Sock aid Additional Comments: Pt sleeps in a recliner. Pt reports daily alcohol  use beginning early in the morning and continuing throughout the day. Pt is a retired Cytogeneticist.      Prior Functioning/Environment Prior Level of Function : Independent/Modified Independent;History of Falls (last six months)             Mobility Comments: Mod I with use of Rollator. Pt reports 3 falls in past week. ADLs Comments: Pt reports he is Independent to Mod I with ADLs. However, pt reports he does not like to take showers and reports only washing up at the sink "every few months" with pt reporting/presenting with redness, irritation, moisture, and foul smell in folds of skin under pannus and folds of skin around groin. OT notified RN. Pt also reports he "knows" he "doesn't do a great job" of cleaning himself after a BM and reporting he cleans his feet by spraying them with the hose outside. Pt reports his wife assists with meal prep and home management tasks.    OT Problem List: Decreased strength;Decreased activity tolerance;Impaired balance (sitting and/or standing);Decreased cognition;Decreased safety awareness;Decreased knowledge of use of DME or AE;Decreased knowledge of precautions;Impaired sensation   OT Treatment/Interventions: Self-care/ADL training;Therapeutic exercise;Energy conservation;DME and/or AE instruction;Therapeutic activities;Patient/family education;Balance training      OT Goals(Current goals can be found in the care plan section)   Acute Rehab OT Goals Patient Stated Goal: to return home and stop falling OT Goal Formulation: With patient Time For Goal Achievement: 11/09/23 Potential to Achieve Goals: Good ADL Goals Pt Will Perform Upper Body Bathing: with supervision;sitting Pt Will Perform Lower Body Bathing: with contact guard  assist;with adaptive equipment;sitting/lateral leans;sit to/from stand Pt Will Perform Lower Body Dressing: with modified independence;sitting/lateral leans;sit to/from stand Pt Will Transfer to Toilet: with modified independence;ambulating;regular height toilet (with least restrictive AD) Pt Will Perform Toileting - Clothing Manipulation and hygiene: with contact guard assist;with adaptive equipment;sitting/lateral leans;sit to/from stand Pt/caregiver will Perform Home Exercise Program: Increased strength;Both right and left upper extremity;With theraband;With Supervision;With written HEP provided Additional ADL Goal #1: Patient will demonstrate ability to Independently state 3 fall prevention strategies to increase safety and independence with functional tasks.   OT Frequency:  Min 2X/week    Co-evaluation              AM-PAC OT "6 Clicks" Daily Activity     Outcome Measure Help from another person eating meals?: A Little Help from another person taking care of personal grooming?: A Little Help from another person toileting, which includes using toliet, bedpan, or urinal?: A Lot Help from another person bathing (including washing, rinsing, drying)?: A Lot Help from another person to put on and taking off regular upper body clothing?: A Little Help from another person to put on and taking off regular lower body clothing?: A Little 6 Click Score: 16   End of Session Equipment Utilized During Treatment: Rollator (4 wheels);Gait belt Nurse Communication: Mobility status;Other (comment) (Pt with redness, irritation, moisture, and foul smell in folds of skin under pannus and folds of skin around groin.)  Activity Tolerance: Patient tolerated treatment well Patient left: in chair;with call bell/phone within reach;with chair alarm set  OT Visit Diagnosis: Unsteadiness on feet (R26.81);Other abnormalities of gait and mobility (R26.89);Repeated falls (R29.6);History of falling  (Z91.81);Muscle weakness (generalized) (M62.81);Other symptoms and signs involving cognitive function;Other (comment) (decreased activity tolerance)  Time: 4098-1191 OT Time Calculation (min): 56 min Charges:  OT General Charges $OT Visit: 1 Visit OT Evaluation $OT Eval Low Complexity: 1 Low OT Treatments $Self Care/Home Management : 38-52 mins  Shantaya Bluestone "Darral Ellis., OTR/L, MA Acute Rehab [phone]  Walt Gunner 10/26/2023, 4:26 PM

## 2023-10-26 NOTE — Plan of Care (Signed)

## 2023-10-26 NOTE — Progress Notes (Signed)
 PROGRESS NOTE  Francis Cardenas:[id number] DOB: 07-14-1950 DOA: 10/25/2023 PCP: Margaret Sharp, PA-C   LOS: 0 days   Brief Narrative / Interim history: Francis Cardenas is a 73 y.o. male with medical history significant of daily EtOH use, BPH, prostate cancer, chronic diastolic CHF, COPD, DM2, obesity comes into the hospital with complaints of several falls over the last 3 to 4 days.  He denies any chest pain or palpitations, no lightheadedness or dizziness but gets weak to the point that falls down.  Last fall was this morning.  He tells me he drinks beer every day, 8-10, starts drinking in the morning and today he was done with his fifth one, got to his walker and that is when he fell.  He states that he has been drinking for a long time ever since he was 56, never experienced any withdrawals or any issues with it.  He denies any recent fever or chills.  Reports recent hospitalization for fluid overload, currently he does not feel like he has significant fluid overload and feels close to baseline.   Subjective / 24h Interval events: He is doing well this morning, no chest pain, no palpitations.  Assesement and Plan: Principal problem Recurrent falls -fairly new, he has been drinking heavily however seems to have tolerated it in the past without falls.  Was found to have very slow A-fib on admission cardiology consulted.  Did have brief rates in the 30s during sleep overnight - Cardiology consulted and evaluated patient, will need to be placed on heart monitor at discharge - Continue to hold Coreg  - PT evaluation still pending  Active problems Hyponatremia-with hypochloremia, we wonder whether he is slightly on the dry side /intravascularly depleted.  He does have lower extremity pitting edema but tells me this is chronic.  It is more on the right leg but he had a fracture there and it has always more swollen -Sodium improved today   A-fib, with slow rates-Coreg  on hold as above.  Continue  anticoagulation with Eliquis    Acute kidney injury on chronic kidney disease stage IIIb -baseline creatinine around 1.5, currently at 1.9 and is somewhat stable today.  Continue to hold all diuretics, closely monitor creatinine in the morning, has had good p.o. intake today   Chronic diastolic CHF-most recent 2D echo August 2024 shows LVEF 60 to 65%, no WMA, mild LVH.  RV was mildly reduced   Alcohol  use-patient drinks 8-10 beers a day, starts in the morning.  He has never had withdrawals.  Monitor, no withdrawals so far   Obesity, morbid-BMI above 40   BPH-continue home medication   OSA on CPAP-continue nightly CPAP   DM2-med rec still not done 24 hours after admission  Scheduled Meds:  apixaban   5 mg Oral BID   atorvastatin   40 mg Oral Daily   fluticasone  furoate-vilanterol  1 puff Inhalation Daily   tamsulosin   0.4 mg Oral QPC supper   umeclidinium bromide   1 puff Inhalation Daily   Continuous Infusions: PRN Meds:.acetaminophen  **OR** acetaminophen , albuterol , ondansetron  **OR** ondansetron  (ZOFRAN ) IV  Current Outpatient Medications  Medication Instructions   acetaminophen  (TYLENOL ) 650 mg, Oral, Every 6 hours PRN   albuterol  (PROVENTIL  HFA;VENTOLIN  HFA) 108 (90 BASE) MCG/ACT inhaler 2 puffs, Every 6 hours PRN   allopurinol  (ZYLOPRIM ) 300 mg, Daily   allopurinol  (ZYLOPRIM ) 100 mg, Daily   ALPRAZolam  (XANAX ) 1 mg, Oral, Daily at bedtime   amLODipine  (NORVASC ) 10 mg, Every evening   apixaban  (ELIQUIS ) 5  mg, Oral, 2 times daily   atorvastatin  (LIPITOR) 40 mg, Daily with supper   carvedilol  (COREG ) 6.25 mg, Daily   empagliflozin  (JARDIANCE ) 25 mg, Oral, Daily   fluticasone -salmeterol (WIXELA INHUB) 250-50 MCG/ACT AEPB 1 puff, Inhalation, 2 times daily   gabapentin  (NEURONTIN ) 300 mg, Oral, 2 times daily   guaifenesin  (HUMIBID E) 400 mg, Daily   hydrALAZINE  (APRESOLINE ) 25 mg, Daily   lisinopril  (ZESTRIL ) 20 mg, Oral, Daily   Magnesium  Oxide 420 mg, Daily with supper    omeprazole  (PRILOSEC) 20 mg, Daily with supper   prednisoLONE  acetate (PRED FORTE ) 1 % ophthalmic suspension 1 drop, Daily   spironolactone  (ALDACTONE ) 25 mg, Daily   tamsulosin  (FLOMAX ) 0.4 mg, Daily after supper   Tiotropium Bromide  Monohydrate (SPIRIVA  RESPIMAT) 2.5 MCG/ACT AERS 2 puffs, Inhalation, Daily   torsemide  (DEMADEX ) 20 mg, 2 times daily   vitamin B-12 (CYANOCOBALAMIN ) 500 mcg, Every evening    Diet Orders (From admission, onward)     Start     Ordered   10/25/23 1835  Diet 2 gram sodium Room service appropriate? Yes; Fluid consistency: Thin  Diet effective now       Question Answer Comment  Room service appropriate? Yes   Fluid consistency: Thin      10/25/23 1834            DVT prophylaxis: SCDs Start: 10/25/23 1835 apixaban  (ELIQUIS ) tablet 5 mg   Lab Results  Component Value Date   PLT 185 10/26/2023      Code Status: Full Code  Family Communication: No family at bedside  Status is: Observation  Level of care: Progressive  Consultants:  Cardiology   Objective: Vitals:   10/25/23 2300 10/26/23 0258 10/26/23 0721 10/26/23 1107  BP: (!) 132/53 (!) 100/40 (!) 133/59 (!) 132/46  Pulse: (!) 55 66 60 62  Resp: 19 15 18 18   Temp: (!) 97 F (36.1 C) 97.8 F (36.6 C) 98.2 F (36.8 C) 98.5 F (36.9 C)  TempSrc: Axillary Axillary Oral Oral  SpO2: 96% 97%  96%    Intake/Output Summary (Last 24 hours) at 10/26/2023 1337 Last data filed at 10/26/2023 1100 Gross per 24 hour  Intake --  Output 2450 ml  Net -2450 ml   Wt Readings from Last 3 Encounters:  09/11/23 (!) 137.4 kg  08/31/23 135 kg  08/04/23 (!) 146.8 kg    Examination:  Constitutional: NAD Eyes: no scleral icterus ENMT: Mucous membranes are moist.  Neck: normal, supple Respiratory: clear to auscultation bilaterally, no wheezing, no crackles. Normal respiratory effort. No accessory muscle use.  Cardiovascular: Regular rate and rhythm, no murmurs / rubs / gallops. No LE edema.   Abdomen: non distended, no tenderness. Bowel sounds positive.  Musculoskeletal: no clubbing / cyanosis.  Skin: no rashes Neurologic: non focal   Data Reviewed: I have independently reviewed following labs and imaging studies   CBC Recent Labs  Lab 10/25/23 1339 10/26/23 0314  WBC 7.3 5.2  HGB 13.2 11.3*  HCT 41.7 35.5*  PLT 213 185  MCV 94.1 93.7  MCH 29.8 29.8  MCHC 31.7 31.8  RDW 15.9* 16.0*  LYMPHSABS 1.3  --   MONOABS 0.6  --   EOSABS 0.1  --   BASOSABS 0.1  --     Recent Labs  Lab 10/25/23 1339 10/26/23 0314  NA 129* 133*  K 3.5 3.3*  CL 95* 102  CO2 19* 23  GLUCOSE 117* 139*  BUN 34* 30*  CREATININE 1.95* 1.91*  CALCIUM  10.4* 10.1  AST 18 19  ALT 21 21  ALKPHOS 88 68  BILITOT 0.8 0.8  ALBUMIN 3.5 2.8*    ------------------------------------------------------------------------------------------------------------------ No results for input(s): "CHOL", "HDL", "LDLCALC", "TRIG", "CHOLHDL", "LDLDIRECT" in the last 72 hours.  Lab Results  Component Value Date   HGBA1C 5.7 (H) 08/26/2023   ------------------------------------------------------------------------------------------------------------------ No results for input(s): "TSH", "T4TOTAL", "T3FREE", "THYROIDAB" in the last 72 hours.  Invalid input(s): "FREET3"  Cardiac Enzymes No results for input(s): "CKMB", "TROPONINI", "MYOGLOBIN" in the last 168 hours.  Invalid input(s): "CK" ------------------------------------------------------------------------------------------------------------------    Component Value Date/Time   BNP 99.7 09/11/2023 1157   BNP 164.9 (H) 08/26/2023 0850    CBG: No results for input(s): "GLUCAP" in the last 168 hours.  No results found for this or any previous visit (from the past 240 hours).   Radiology Studies: CT CERVICAL SPINE WO CONTRAST Result Date: 10/25/2023 CLINICAL DATA:  Head trauma, moderate-severe; Polytrauma, blunt. EXAM: CT HEAD WITHOUT CONTRAST  CT CERVICAL SPINE WITHOUT CONTRAST TECHNIQUE: Multidetector CT imaging of the head and cervical spine was performed following the standard protocol without intravenous contrast. Multiplanar CT image reconstructions of the cervical spine were also generated. RADIATION DOSE REDUCTION: This exam was performed according to the departmental dose-optimization program which includes automated exposure control, adjustment of the mA and/or kV according to patient size and/or use of iterative reconstruction technique. COMPARISON:  CT scan head and cervical spine from 05/01/2023. FINDINGS: CT HEAD FINDINGS Brain: No evidence of acute infarction, hemorrhage, hydrocephalus, extra-axial collection or mass lesion/mass effect. There is bilateral periventricular hypodensity, which is non-specific but most likely seen in the settings of microvascular ischemic changes. Moderate in extent. Otherwise normal appearance of brain parenchyma. Ventricles are normal. Cerebral volume is age appropriate. Vascular: No hyperdense vessel or unexpected calcification. Intracranial arteriosclerosis. Skull: Normal. Negative for fracture or focal lesion. Sinuses/Orbits: No acute finding. Other: Visualized mastoid air cells are unremarkable. No mastoid effusion. CT CERVICAL SPINE FINDINGS Alignment: There is loss of cervical lordosis, which may be positional or due to muscle spasm. This examination does not assess for ligamentous injury or stability. Skull base and vertebrae: No acute fracture. No primary bone lesion or focal pathologic process. Soft tissues and spinal canal: No prevertebral fluid or swelling. No visible canal hematoma. Disc levels: There are moderate multilevel degenerative changes of the cervical spine characterized by reduced intervertebral disc height, facet arthropathy and marginal osteophyte formation. Upper chest: Negative. IMPRESSION: 1. No acute intracranial abnormality. 2. No acute osseous injury or traumatic listhesis of the  cervical spine. Electronically Signed   By: Beula Brunswick M.D.   On: 10/25/2023 15:36   CT HEAD WO CONTRAST Result Date: 10/25/2023 CLINICAL DATA:  Head trauma, moderate-severe; Polytrauma, blunt. EXAM: CT HEAD WITHOUT CONTRAST CT CERVICAL SPINE WITHOUT CONTRAST TECHNIQUE: Multidetector CT imaging of the head and cervical spine was performed following the standard protocol without intravenous contrast. Multiplanar CT image reconstructions of the cervical spine were also generated. RADIATION DOSE REDUCTION: This exam was performed according to the departmental dose-optimization program which includes automated exposure control, adjustment of the mA and/or kV according to patient size and/or use of iterative reconstruction technique. COMPARISON:  CT scan head and cervical spine from 05/01/2023. FINDINGS: CT HEAD FINDINGS Brain: No evidence of acute infarction, hemorrhage, hydrocephalus, extra-axial collection or mass lesion/mass effect. There is bilateral periventricular hypodensity, which is non-specific but most likely seen in the settings of microvascular ischemic changes. Moderate in extent. Otherwise normal appearance of brain parenchyma. Ventricles  are normal. Cerebral volume is age appropriate. Vascular: No hyperdense vessel or unexpected calcification. Intracranial arteriosclerosis. Skull: Normal. Negative for fracture or focal lesion. Sinuses/Orbits: No acute finding. Other: Visualized mastoid air cells are unremarkable. No mastoid effusion. CT CERVICAL SPINE FINDINGS Alignment: There is loss of cervical lordosis, which may be positional or due to muscle spasm. This examination does not assess for ligamentous injury or stability. Skull base and vertebrae: No acute fracture. No primary bone lesion or focal pathologic process. Soft tissues and spinal canal: No prevertebral fluid or swelling. No visible canal hematoma. Disc levels: There are moderate multilevel degenerative changes of the cervical spine  characterized by reduced intervertebral disc height, facet arthropathy and marginal osteophyte formation. Upper chest: Negative. IMPRESSION: 1. No acute intracranial abnormality. 2. No acute osseous injury or traumatic listhesis of the cervical spine. Electronically Signed   By: Beula Brunswick M.D.   On: 10/25/2023 15:36   CT CHEST ABDOMEN PELVIS WO CONTRAST Result Date: 10/25/2023 CLINICAL DATA:  Polytrauma, blunt.  Fall. EXAM: CT CHEST, ABDOMEN AND PELVIS WITHOUT CONTRAST TECHNIQUE: Multidetector CT imaging of the chest, abdomen and pelvis was performed following the standard protocol without IV contrast. RADIATION DOSE REDUCTION: This exam was performed according to the departmental dose-optimization program which includes automated exposure control, adjustment of the mA and/or kV according to patient size and/or use of iterative reconstruction technique. COMPARISON:  CT scan chest, abdomen and pelvis from 05/01/2023. FINDINGS: CT CHEST FINDINGS Cardiovascular: Normal cardiac size. No pericardial effusion. No aortic aneurysm. There are coronary artery calcifications, in keeping with coronary artery disease. There are also mild peripheral atherosclerotic vascular calcifications of thoracic aorta and its major branches. Mediastinum/Nodes: Visualized thyroid  gland appears grossly unremarkable. No solid / cystic mediastinal masses. The esophagus is nondistended precluding optimal assessment. No mediastinal or axillary lymphadenopathy by size criteria. Evaluation of bilateral hila is limited due to lack on intravenous contrast: however, no large hilar lymphadenopathy identified. Lungs/Pleura: The central tracheo-bronchial tree is patent. No mass or consolidation. No pleural effusion or pneumothorax. No suspicious lung nodules. Musculoskeletal: The visualized soft tissues of the chest wall are grossly unremarkable. No suspicious osseous lesions. There are mild multilevel degenerative changes in the visualized spine.  Multiple old, healed, right-sided rib fractures noted. CT ABDOMEN PELVIS FINDINGS Hepatobiliary: The liver is normal in size. Non-cirrhotic configuration. No suspicious mass. No intrahepatic or extrahepatic bile duct dilation. Very small volume noncalcified gallstone noted without imaging signs of acute cholecystitis. Normal gallbladder wall thickness. No pericholecystic inflammatory changes. Pancreas: Unremarkable. No pancreatic ductal dilatation or surrounding inflammatory changes. Spleen: Within normal limits. No focal lesion. Adrenals/Urinary Tract: Adrenal glands are unremarkable. No suspicious renal mass within the limitations of this unenhanced exam. No nephroureterolithiasis or obstructive uropathy. Unremarkable urinary bladder. Stomach/Bowel: No disproportionate dilation of the small or large bowel loops. No evidence of abnormal bowel wall thickening or inflammatory changes. The appendix is unremarkable. There are multiple diverticula mainly in the sigmoid colon, without imaging signs of diverticulitis. Vascular/Lymphatic: No ascites or pneumoperitoneum. No abdominal or pelvic lymphadenopathy, by size criteria. No aneurysmal dilation of the major abdominal arteries. There are mild peripheral atherosclerotic vascular calcifications of the aorta and its major branches. Reproductive: Normal size prostate. Symmetric seminal vesicles. Fiducial markers noted in the prostate. Other: There is a tiny fat containing umbilical hernia. The soft tissues and abdominal wall are otherwise unremarkable. Musculoskeletal: No suspicious osseous lesions. There are mild - moderate multilevel degenerative changes in the visualized spine. IMPRESSION: 1. No acute traumatic injury to  the chest, abdomen or pelvis. 2. Multiple other nonacute observations, as described above. Aortic Atherosclerosis (ICD10-I70.0). Electronically Signed   By: Beula Brunswick M.D.   On: 10/25/2023 15:19     Kathlen Para, MD, PhD [facility]  Between 7 am - 7 pm I am available, please contact me via Amion (for emergencies) or Securechat (non urgent messages)  Between 7 pm - 7 am I am not available, please contact night coverage MD/APP via Amion

## 2023-10-26 NOTE — Plan of Care (Signed)

## 2023-10-26 NOTE — Progress Notes (Signed)
 Mobility Specialist Progress Note:    10/26/23 1448  Mobility  Activity Transferred from chair to bed  Level of Assistance Contact guard assist, steadying assist  Assistive Device None  Distance Ambulated (ft) 2 ft  Activity Response Tolerated well  Mobility Referral Yes  Mobility visit 1 Mobility  Mobility Specialist Start Time (ACUTE ONLY) 1430  Mobility Specialist Stop Time (ACUTE ONLY) 1435  Mobility Specialist Time Calculation (min) (ACUTE ONLY) 5 min   Pt received in chair, requesting assistance to transfer back to bed. Tolerated well, asx throughout. Left pt in bed with all needs met, call bell in reach.   Jaielle Dlouhy Mobility Specialist Please contact via Special educational needs teacher or  Rehab office at [phone]

## 2023-10-26 NOTE — Evaluation (Signed)
 Physical Therapy Evaluation Patient Details Name: Francis Cardenas MRN: [id number] DOB: Jul 22, 1950 Today's Date: 10/26/2023  History of Present Illness  Patient is 73 y.o. male presents to hospital with c/o several falls over the last 3 to 4 days. Pt denies any CP or palpitations, no lightheadedness or dizziness, but reports weakness causing falls. ED w/u revealed soft BP and bradycardic. CT C-spine, head, abdomen without any trauma. PMH significant for permanent A-fib, OSA on CPAP, CKD stage III, HFpEF, hypertension, DM2, GERD, COPD, morbid obesity, and EtOH abuse.   Clinical Impression  JAYVYN HASELTON is 73 y.o. male admitted with above HPI and diagnosis. Patient is currently limited by functional impairments below (see PT problem list). Patient lives with spouse and is mod ind with rollator for mobility at baseline. Currently pt requires supervision for safety with bed mobility and transfers to rollator. Pt denied dizziness/foggy sensation and VSS throughout (see below). Pt ambulated ~300' with Rollator and no overt LOB and demonstrated good management of 4WW. Patient will benefit from continued skilled PT interventions to address impairments and progress independence with mobility. Acute PT will follow and progress as able.      Orthostatic VS for the past 24 hrs:  BP- Lying Pulse- Lying BP- Sitting Pulse- Sitting BP- Standing at 0 minutes Pulse- Standing at 0 minutes  10/26/23 0918 (post gait) -- -- 157/62 67 -- --  10/26/23 0855 134/53 63 140/61 68 153/58 78        If plan is discharge home, recommend the following: Assist for transportation;Help with stairs or ramp for entrance;Assistance with cooking/housework   Can travel by private vehicle        Equipment Recommendations None recommended by PT  Recommendations for Other Services       Functional Status Assessment Patient has had a recent decline in their functional status and demonstrates the ability to make significant  improvements in function in a reasonable and predictable amount of time.     Precautions / Restrictions Precautions Precautions: Fall Restrictions Weight Bearing Restrictions Per Provider Order: No      Mobility  Bed Mobility Overal bed mobility: Needs Assistance Bed Mobility: Supine to Sit     Supine to sit: Supervision, Used rails, HOB elevated     General bed mobility comments: extra time, reliant on use of bed rails    Transfers Overall transfer level: Needs assistance Equipment used: Rollator (4 wheels) Transfers: Sit to/from Stand Sit to Stand: Supervision           General transfer comment: use of hands to rise and lower. sup for safety.    Ambulation/Gait Ambulation/Gait assistance: Contact guard assist, Supervision Gait Distance (Feet): 300 Feet Assistive device: Rollator (4 wheels) Gait Pattern/deviations: Step-through pattern, Decreased stride length, Trunk flexed, Wide base of support Gait velocity: decr     General Gait Details: cues for brakes at start to unlock for gait. pt maintained safe management and position to rollator with gait. no overt LOB.  Stairs            Wheelchair Mobility     Tilt Bed    Modified Rankin (Stroke Patients Only)       Balance Overall balance assessment: Needs assistance Sitting-balance support: Feet supported Sitting balance-Leahy Scale: Good     Standing balance support: Bilateral upper extremity supported, During functional activity, Reliant on assistive device for balance Standing balance-Leahy Scale: Fair  Pertinent Vitals/Pain Pain Assessment Pain Assessment: No/denies pain    Home Living Family/patient expects to be discharged to:: Private residence Living Arrangements: Spouse/significant other Available Help at Discharge: Family;Available PRN/intermittently Type of Home: House Home Access: Ramped entrance;Other (comment)       Home Layout:  One level Home Equipment: Rollator (4 wheels);Shower seat;Grab bars - tub/shower;Insurance risk surveyor (2 wheels)      Prior Function Prior Level of Function : Independent/Modified Independent;History of Falls (last six months)             Mobility Comments: Mod I rollator ADLs Comments: spouse does cooking/cleaning. Per notes pt does not bathe     Extremity/Trunk Assessment   Upper Extremity Assessment Upper Extremity Assessment: Defer to OT evaluation;Overall WFL for tasks assessed    Lower Extremity Assessment Lower Extremity Assessment: Overall WFL for tasks assessed    Cervical / Trunk Assessment Cervical / Trunk Assessment: Other exceptions Cervical / Trunk Exceptions: habitus  Communication   Communication Communication: No apparent difficulties    Cognition Arousal: Alert Behavior During Therapy: WFL for tasks assessed/performed   PT - Cognitive impairments: No apparent impairments                       PT - Cognition Comments: verbose Following commands: Intact       Cueing Cueing Techniques: Verbal cues     General Comments      Exercises     Assessment/Plan    PT Assessment Patient needs continued PT services  PT Problem List Decreased strength;Decreased knowledge of precautions;Decreased safety awareness;Decreased knowledge of use of DME;Decreased mobility;Decreased balance;Decreased activity tolerance;Decreased range of motion;Cardiopulmonary status limiting activity;Obesity       PT Treatment Interventions DME instruction;Gait training;Stair training;Functional mobility training;Therapeutic activities;Therapeutic exercise;Balance training;Patient/family education    PT Goals (Current goals can be found in the Care Plan section)  Acute Rehab PT Goals Patient Stated Goal: get home, stop falling PT Goal Formulation: With patient Time For Goal Achievement: 11/09/23 Potential to Achieve Goals: Good    Frequency Min  2X/week     Co-evaluation               AM-PAC PT "6 Clicks" Mobility  Outcome Measure Help needed turning from your back to your side while in a flat bed without using bedrails?: A Little Help needed moving from lying on your back to sitting on the side of a flat bed without using bedrails?: A Little Help needed moving to and from a bed to a chair (including a wheelchair)?: A Little Help needed standing up from a chair using your arms (e.g., wheelchair or bedside chair)?: A Little Help needed to walk in hospital room?: A Little Help needed climbing 3-5 steps with a railing? : A Little 6 Click Score: 18    End of Session Equipment Utilized During Treatment: Gait belt Activity Tolerance: Patient tolerated treatment well Patient left: in chair;with call bell/phone within reach;with chair alarm set Nurse Communication: Mobility status PT Visit Diagnosis: Other abnormalities of gait and mobility (R26.89);Muscle weakness (generalized) (M62.81);Difficulty in walking, not elsewhere classified (R26.2)    Time: 1191-4782 PT Time Calculation (min) (ACUTE ONLY): 40 min   Charges:   PT Evaluation $PT Eval Moderate Complexity: 1 Mod PT Treatments $Gait Training: 8-22 mins $Therapeutic Activity: 8-22 mins PT General Charges $$ ACUTE PT VISIT: 1 Visit         Tish Forge, DPT Acute Rehabilitation Services Office [phone]  10/26/23 2:31  PM

## 2023-10-26 NOTE — Progress Notes (Signed)
 Rounding Note   Patient Name: Francis Cardenas Date of Encounter: 10/26/2023  [facility] Cardiologist: Sheryle Donning, MD   Subjective Pt states he doesn't drink 18 beers per day anymore but does drink alcohol  daily. He describes "not feeling right" prior to falls, but can't describe symptoms further.   Scheduled Meds:  apixaban   5 mg Oral BID   atorvastatin   40 mg Oral Daily   fluticasone  furoate-vilanterol  1 puff Inhalation Daily   tamsulosin   0.4 mg Oral QPC supper   umeclidinium bromide   1 puff Inhalation Daily   Continuous Infusions:  PRN Meds: acetaminophen  **OR** acetaminophen , albuterol , ondansetron  **OR** ondansetron  (ZOFRAN ) IV   Vital Signs  Vitals:   10/25/23 2300 10/26/23 0258 10/26/23 0721 10/26/23 1107  BP: (!) 132/53 (!) 100/40 (!) 133/59 (!) 132/46  Pulse: (!) 55 66 60 62  Resp: 19 15 18 18   Temp: (!) 97 F (36.1 C) 97.8 F (36.6 C) 98.2 F (36.8 C) 98.5 F (36.9 C)  TempSrc: Axillary Axillary Oral Oral  SpO2: 96% 97%  96%    Intake/Output Summary (Last 24 hours) at 10/26/2023 1207 Last data filed at 10/26/2023 1100 Gross per 24 hour  Intake --  Output 2450 ml  Net -2450 ml      09/11/2023   10:57 AM 08/31/2023    3:57 AM 08/30/2023    6:16 AM  Last 3 Weights  Weight (lbs) 303 lb 297 lb 9.9 oz 302 lb 11.1 oz  Weight (kg) 137.44 kg 135 kg 137.3 kg      Telemetry Afib with rates generally in the 50-60s, some rates in the high 30s overnight during nocturnal hours - Personally Reviewed   Physical Exam  GEN: obese male in NAD.   Neck: No JVD exam difficult Cardiac: irregular rhythm, regular rate.  Respiratory: Clear to auscultation bilaterally. GI: Soft, nontender, non-distended  MS: 1+ B LE pitting edema; No deformity. Neuro:  Nonfocal  Psych: Normal affect   Labs High Sensitivity Troponin:  No results for input(s): "TROPONINIHS" in the last 720 hours.   Chemistry Recent Labs  Lab 10/25/23 1339 10/26/23 0314  NA 129*  133*  K 3.5 3.3*  CL 95* 102  CO2 19* 23  GLUCOSE 117* 139*  BUN 34* 30*  CREATININE 1.95* 1.91*  CALCIUM  10.4* 10.1  PROT 6.4* 5.3*  ALBUMIN 3.5 2.8*  AST 18 19  ALT 21 21  ALKPHOS 88 68  BILITOT 0.8 0.8  GFRNONAA 35* 36*  ANIONGAP 15 8    Lipids No results for input(s): "CHOL", "TRIG", "HDL", "LABVLDL", "LDLCALC", "CHOLHDL" in the last 168 hours.  Hematology Recent Labs  Lab 10/25/23 1339 10/26/23 0314  WBC 7.3 5.2  RBC 4.43 3.79*  HGB 13.2 11.3*  HCT 41.7 35.5*  MCV 94.1 93.7  MCH 29.8 29.8  MCHC 31.7 31.8  RDW 15.9* 16.0*  PLT 213 185   Thyroid  No results for input(s): "TSH", "FREET4" in the last 168 hours.  BNPNo results for input(s): "BNP", "PROBNP" in the last 168 hours.  DDimer No results for input(s): "DDIMER" in the last 168 hours.   Radiology  CT CERVICAL SPINE WO CONTRAST Result Date: 10/25/2023 CLINICAL DATA:  Head trauma, moderate-severe; Polytrauma, blunt. EXAM: CT HEAD WITHOUT CONTRAST CT CERVICAL SPINE WITHOUT CONTRAST TECHNIQUE: Multidetector CT imaging of the head and cervical spine was performed following the standard protocol without intravenous contrast. Multiplanar CT image reconstructions of the cervical spine were also generated. RADIATION DOSE REDUCTION: This exam was  performed according to the departmental dose-optimization program which includes automated exposure control, adjustment of the mA and/or kV according to patient size and/or use of iterative reconstruction technique. COMPARISON:  CT scan head and cervical spine from 05/01/2023. FINDINGS: CT HEAD FINDINGS Brain: No evidence of acute infarction, hemorrhage, hydrocephalus, extra-axial collection or mass lesion/mass effect. There is bilateral periventricular hypodensity, which is non-specific but most likely seen in the settings of microvascular ischemic changes. Moderate in extent. Otherwise normal appearance of brain parenchyma. Ventricles are normal. Cerebral volume is age appropriate.  Vascular: No hyperdense vessel or unexpected calcification. Intracranial arteriosclerosis. Skull: Normal. Negative for fracture or focal lesion. Sinuses/Orbits: No acute finding. Other: Visualized mastoid air cells are unremarkable. No mastoid effusion. CT CERVICAL SPINE FINDINGS Alignment: There is loss of cervical lordosis, which may be positional or due to muscle spasm. This examination does not assess for ligamentous injury or stability. Skull base and vertebrae: No acute fracture. No primary bone lesion or focal pathologic process. Soft tissues and spinal canal: No prevertebral fluid or swelling. No visible canal hematoma. Disc levels: There are moderate multilevel degenerative changes of the cervical spine characterized by reduced intervertebral disc height, facet arthropathy and marginal osteophyte formation. Upper chest: Negative. IMPRESSION: 1. No acute intracranial abnormality. 2. No acute osseous injury or traumatic listhesis of the cervical spine. Electronically Signed   By: Beula Brunswick M.D.   On: 10/25/2023 15:36   CT HEAD WO CONTRAST Result Date: 10/25/2023 CLINICAL DATA:  Head trauma, moderate-severe; Polytrauma, blunt. EXAM: CT HEAD WITHOUT CONTRAST CT CERVICAL SPINE WITHOUT CONTRAST TECHNIQUE: Multidetector CT imaging of the head and cervical spine was performed following the standard protocol without intravenous contrast. Multiplanar CT image reconstructions of the cervical spine were also generated. RADIATION DOSE REDUCTION: This exam was performed according to the departmental dose-optimization program which includes automated exposure control, adjustment of the mA and/or kV according to patient size and/or use of iterative reconstruction technique. COMPARISON:  CT scan head and cervical spine from 05/01/2023. FINDINGS: CT HEAD FINDINGS Brain: No evidence of acute infarction, hemorrhage, hydrocephalus, extra-axial collection or mass lesion/mass effect. There is bilateral periventricular  hypodensity, which is non-specific but most likely seen in the settings of microvascular ischemic changes. Moderate in extent. Otherwise normal appearance of brain parenchyma. Ventricles are normal. Cerebral volume is age appropriate. Vascular: No hyperdense vessel or unexpected calcification. Intracranial arteriosclerosis. Skull: Normal. Negative for fracture or focal lesion. Sinuses/Orbits: No acute finding. Other: Visualized mastoid air cells are unremarkable. No mastoid effusion. CT CERVICAL SPINE FINDINGS Alignment: There is loss of cervical lordosis, which may be positional or due to muscle spasm. This examination does not assess for ligamentous injury or stability. Skull base and vertebrae: No acute fracture. No primary bone lesion or focal pathologic process. Soft tissues and spinal canal: No prevertebral fluid or swelling. No visible canal hematoma. Disc levels: There are moderate multilevel degenerative changes of the cervical spine characterized by reduced intervertebral disc height, facet arthropathy and marginal osteophyte formation. Upper chest: Negative. IMPRESSION: 1. No acute intracranial abnormality. 2. No acute osseous injury or traumatic listhesis of the cervical spine. Electronically Signed   By: Beula Brunswick M.D.   On: 10/25/2023 15:36   CT CHEST ABDOMEN PELVIS WO CONTRAST Result Date: 10/25/2023 CLINICAL DATA:  Polytrauma, blunt.  Fall. EXAM: CT CHEST, ABDOMEN AND PELVIS WITHOUT CONTRAST TECHNIQUE: Multidetector CT imaging of the chest, abdomen and pelvis was performed following the standard protocol without IV contrast. RADIATION DOSE REDUCTION: This exam was performed according  to the departmental dose-optimization program which includes automated exposure control, adjustment of the mA and/or kV according to patient size and/or use of iterative reconstruction technique. COMPARISON:  CT scan chest, abdomen and pelvis from 05/01/2023. FINDINGS: CT CHEST FINDINGS Cardiovascular: Normal  cardiac size. No pericardial effusion. No aortic aneurysm. There are coronary artery calcifications, in keeping with coronary artery disease. There are also mild peripheral atherosclerotic vascular calcifications of thoracic aorta and its major branches. Mediastinum/Nodes: Visualized thyroid  gland appears grossly unremarkable. No solid / cystic mediastinal masses. The esophagus is nondistended precluding optimal assessment. No mediastinal or axillary lymphadenopathy by size criteria. Evaluation of bilateral hila is limited due to lack on intravenous contrast: however, no large hilar lymphadenopathy identified. Lungs/Pleura: The central tracheo-bronchial tree is patent. No mass or consolidation. No pleural effusion or pneumothorax. No suspicious lung nodules. Musculoskeletal: The visualized soft tissues of the chest wall are grossly unremarkable. No suspicious osseous lesions. There are mild multilevel degenerative changes in the visualized spine. Multiple old, healed, right-sided rib fractures noted. CT ABDOMEN PELVIS FINDINGS Hepatobiliary: The liver is normal in size. Non-cirrhotic configuration. No suspicious mass. No intrahepatic or extrahepatic bile duct dilation. Very small volume noncalcified gallstone noted without imaging signs of acute cholecystitis. Normal gallbladder wall thickness. No pericholecystic inflammatory changes. Pancreas: Unremarkable. No pancreatic ductal dilatation or surrounding inflammatory changes. Spleen: Within normal limits. No focal lesion. Adrenals/Urinary Tract: Adrenal glands are unremarkable. No suspicious renal mass within the limitations of this unenhanced exam. No nephroureterolithiasis or obstructive uropathy. Unremarkable urinary bladder. Stomach/Bowel: No disproportionate dilation of the small or large bowel loops. No evidence of abnormal bowel wall thickening or inflammatory changes. The appendix is unremarkable. There are multiple diverticula mainly in the sigmoid colon,  without imaging signs of diverticulitis. Vascular/Lymphatic: No ascites or pneumoperitoneum. No abdominal or pelvic lymphadenopathy, by size criteria. No aneurysmal dilation of the major abdominal arteries. There are mild peripheral atherosclerotic vascular calcifications of the aorta and its major branches. Reproductive: Normal size prostate. Symmetric seminal vesicles. Fiducial markers noted in the prostate. Other: There is a tiny fat containing umbilical hernia. The soft tissues and abdominal wall are otherwise unremarkable. Musculoskeletal: No suspicious osseous lesions. There are mild - moderate multilevel degenerative changes in the visualized spine. IMPRESSION: 1. No acute traumatic injury to the chest, abdomen or pelvis. 2. Multiple other nonacute observations, as described above. Aortic Atherosclerosis (ICD10-I70.0). Electronically Signed   By: Beula Brunswick M.D.   On: 10/25/2023 15:19   DG Chest Port 1 View Result Date: 10/25/2023 CLINICAL DATA:  Trauma. EXAM: PORTABLE CHEST 1 VIEW COMPARISON:  Chest radiograph dated 08/26/2023 FINDINGS: No focal consolidation, pleural effusion or pneumothorax. There is mild cardiomegaly and mild vascular congestion. No acute osseous pathology. IMPRESSION: 1. No focal consolidation. 2. Mild cardiomegaly and mild vascular congestion. Electronically Signed   By: Angus Bark M.D.   On: 10/25/2023 13:52    Cardiac Studies    Patient Profile   73 y.o. male  with a hx of permanent A-fib, OSA on CPAP, CKD stage III, HFpEF, hypertension, DM2, GERD and EtOH abuse who is being seen for bradycardia.    Assessment & Plan   Bradycardia - telemetry with HR in the 50-60s, Afib - no significant pauses seen, but did have brief rates in the 30s during sleeping hours - will place heart monitor at discharge - agree with holding coreg    Frequent falls - in the setting of alcohol  use - 3 episodes since Sunday - occur with walking - not  typically right after  standing, does not sound orthostatic - started using a rollator when he resumed drinking alcohol  - unclear etiology of falls, but suspicious for mechanical falls vs bradycardia vs orthostasis - orthostatic vitals negative for hypotension   Permanent atrial fibrillation Chronic anticoagulation - no true syncope, but falls recently  - will need to monitor closely given OAC   ETOH abuse - counseled on cessation   Lower extremity edema HFpEF AonCKD Hypertension - he states this LE edema is at baseline - lungs clear - holding torsemide  and spironolactone  - sCr 1.91 today - creatinine trending down - now 1.91, from 1.95, baseline 1.2-1.5 - home anti-hypertensives also held - may need to taylor medications to current BP - home amlodipine  10 mg, 25 mg hydralazine  daily, 20 mg lisinopril  - if renal function returns to baseline, could consider low does entresto rather than lisinopril  and daily hydralazine  - somewhat wide pulse pressure on BP trends     For questions or updates, please contact  [facility] Please consult [email or web address] for contact info under     Signed, Lamond Pilot, PA  10/26/2023, 12:07 PM

## 2023-10-27 ENCOUNTER — Ambulatory Visit (HOSPITAL_BASED_OUTPATIENT_CLINIC_OR_DEPARTMENT_OTHER): Admitting: Family

## 2023-10-27 ENCOUNTER — Inpatient Hospital Stay (HOSPITAL_COMMUNITY)
Admit: 2023-10-27 | Discharge: 2023-10-27 | Disposition: A | Discharge status: Home / Self Care | Attending: Physician Assistant | Admitting: Physician Assistant

## 2023-10-27 ENCOUNTER — Encounter (HOSPITAL_BASED_OUTPATIENT_CLINIC_OR_DEPARTMENT_OTHER): Payer: Self-pay

## 2023-10-27 ENCOUNTER — Telehealth: Payer: Self-pay | Admitting: Home Health

## 2023-10-27 ENCOUNTER — Other Ambulatory Visit (HOSPITAL_COMMUNITY): Payer: Self-pay

## 2023-10-27 DIAGNOSIS — I4891 Unspecified atrial fibrillation: Secondary | ICD-10-CM | POA: Diagnosis not present

## 2023-10-27 DIAGNOSIS — R001 Bradycardia, unspecified: Secondary | ICD-10-CM

## 2023-10-27 DIAGNOSIS — E8809 Other disorders of plasma-protein metabolism, not elsewhere classified: Secondary | ICD-10-CM | POA: Diagnosis not present

## 2023-10-27 DIAGNOSIS — I5032 Chronic diastolic (congestive) heart failure: Secondary | ICD-10-CM | POA: Diagnosis not present

## 2023-10-27 DIAGNOSIS — N179 Acute kidney failure, unspecified: Secondary | ICD-10-CM | POA: Diagnosis not present

## 2023-10-27 LAB — CBC
HCT: 36.7 % — ABNORMAL LOW (ref 39.0–52.0)
Hemoglobin: 11.6 g/dL — ABNORMAL LOW (ref 13.0–17.0)
MCH: 29.5 pg (ref 26.0–34.0)
MCHC: 31.6 g/dL (ref 30.0–36.0)
MCV: 93.4 fL (ref 80.0–100.0)
Platelets: 193 10*3/uL (ref 150–400)
RBC: 3.93 MIL/uL — ABNORMAL LOW (ref 4.22–5.81)
RDW: 16 % — ABNORMAL HIGH (ref 11.5–15.5)
WBC: 5.1 10*3/uL (ref 4.0–10.5)
nRBC: 0 % (ref 0.0–0.2)

## 2023-10-27 LAB — BASIC METABOLIC PANEL WITH GFR
Anion gap: 7 (ref 5–15)
BUN: 22 mg/dL (ref 8–23)
CO2: 25 mmol/L (ref 22–32)
Calcium: 10.2 mg/dL (ref 8.9–10.3)
Chloride: 103 mmol/L (ref 98–111)
Creatinine, Ser: 1.3 mg/dL — ABNORMAL HIGH (ref 0.61–1.24)
GFR, Estimated: 58 mL/min — ABNORMAL LOW (ref >60)
Glucose, Bld: 140 mg/dL — ABNORMAL HIGH (ref 70–99)
Potassium: 3.8 mmol/L (ref 3.5–5.1)
Sodium: 135 mmol/L (ref 135–145)

## 2023-10-27 LAB — MAGNESIUM: Magnesium: 2.4 mg/dL (ref 1.7–2.4)

## 2023-10-27 MED ORDER — TORSEMIDE 20 MG PO TABS: 20.0000 mg | ORAL_TABLET | Freq: Every day | ORAL | Status: AC

## 2023-10-27 MED ORDER — ENTRESTO 24-26 MG PO TABS
1.0000 | ORAL_TABLET | Freq: Two times a day (BID) | ORAL | 0 refills | Status: AC
  Filled 2023-10-27: qty 60, 30d supply, fill #0

## 2023-10-27 NOTE — Discharge Summary (Signed)
 Physician Discharge Summary  Francis Cardenas:[id number] DOB: 19-Jun-1950 DOA: 10/25/2023  PCP: Margaret Sharp, PA-C  Admit date: 10/25/2023 Discharge date: 10/27/2023  Admitted From: home Disposition:  home  Recommendations for Outpatient Follow-up:  Follow up with PCP in 1-2 weeks Please obtain BMP/CBC in one week  Home Health: PT Equipment/Devices: none  Discharge Condition: stable CODE STATUS: Full code Diet Orders (From admission, onward)     Start     Ordered   10/25/23 1835  Diet 2 gram sodium Room service appropriate? Yes; Fluid consistency: Thin  Diet effective now       Question Answer Comment  Room service appropriate? Yes   Fluid consistency: Thin      10/25/23 1834            Brief Narrative / Interim history: Francis Cardenas is a 73 y.o. male with medical history significant of daily EtOH use, BPH, prostate cancer, chronic diastolic CHF, COPD, DM2, obesity comes into the hospital with complaints of several falls over the last 3 to 4 days.  He denies any chest pain or palpitations, no lightheadedness or dizziness but gets weak to the point that falls down.  Last fall was this morning.  He tells me he drinks beer every day, 8-10, starts drinking in the morning and today he was done with his fifth one, got to his walker and that is when he fell.  He states that he has been drinking for a long time ever since he was 29, never experienced any withdrawals or any issues with it.  He denies any recent fever or chills.  Reports recent hospitalization for fluid overload, currently he does not feel like he has significant fluid overload and feels close to baseline.   Hospital Course / Discharge diagnoses: Principal Problem:   Symptomatic bradycardia Active Problems:   Fall   Acute kidney injury superimposed on stage 3a chronic kidney disease (HCC)   Hyponatremia   Hypoalbuminemia   Chronic diastolic heart failure (HCC)   Atrial fibrillation with slow ventricular  response (HCC)   Principal problem Recurrent falls -fairly new, he has been drinking heavily however seems to have tolerated it in the past without falls.  Was found to have very slow A-fib on admission cardiology consulted.  Did have brief rates in the 30s during sleep overnight. Cardiology consulted and evaluated patient while hospitalized. His medications were changes as below, Coreg  held and will have a heart monitor at discharge.  He was able to work with physical therapy without further difficulties, strongly advised to avoid alcohol  altogether and will be discharged home in stable condition with home health PT   Active problems Hyponatremia-with hypochloremia, felt to be slightly intravascularly depleted, improved with diuretic hold  A-fib, with slow rates-Coreg  on hold as above.  Continue anticoagulation with Eliquis  Acute kidney injury on chronic kidney disease stage IIIb -baseline creatinine around 1.5, up to 1.9 in the setting of being dry, improved off diuretics Chronic diastolic CHF-most recent 2D echo August 2024 shows LVEF 60 to 65%, no WMA, mild LVH.  RV was mildly reduced. Multiple medication changes as below including diuretic change and starting entresto Alcohol  use-patient drinks 8-10 beers a day, starts in the morning.  He has never had withdrawals and exhibited no withdrawals while here.  He was counseled for cessation Obesity, morbid-BMI above 40 BPH-continue home medication OSA on CPAP-continue nightly CPAP DM2-resume home medications  Sepsis ruled out   Discharge Instructions   Allergies as  of 10/27/2023       Reactions   Oxcarbazepine  Other (See Comments)   Dizziness/ lightheaded   Zolpidem  Nausea Only        Medication List     STOP taking these medications    amLODipine  10 MG tablet Commonly known as: NORVASC    carvedilol  6.25 MG tablet Commonly known as: COREG    hydrALAZINE  25 MG tablet Commonly known as: APRESOLINE    lisinopril  20 MG  tablet Commonly known as: ZESTRIL    spironolactone  25 MG tablet Commonly known as: ALDACTONE        TAKE these medications    acetaminophen  325 MG tablet Commonly known as: Tylenol  Take 2 tablets (650 mg total) by mouth every 6 (six) hours as needed.   albuterol  108 (90 Base) MCG/ACT inhaler Commonly known as: VENTOLIN  HFA Inhale 2 puffs into the lungs every 6 (six) hours as needed for wheezing or shortness of breath.   allopurinol  100 MG tablet Commonly known as: ZYLOPRIM  Take 100 mg by mouth daily.   allopurinol  300 MG tablet Commonly known as: ZYLOPRIM  Take 300 mg by mouth daily.   ALPRAZolam  1 MG tablet Commonly known as: XANAX  Take 1 tablet (1 mg total) by mouth at bedtime. What changed:  when to take this reasons to take this   apixaban  5 MG Tabs tablet Commonly known as: ELIQUIS  Take 1 tablet (5 mg total) by mouth 2 (two) times daily.   atorvastatin  80 MG tablet Commonly known as: LIPITOR Take 40 mg by mouth daily with supper. NOTIFY CLINIC FOR UNUSUAL MUSCLE ACHES OR WEAKNESS, BROWN URINE, BLOOD IN URINE, YELLOWING OF SKIN/WHITES OF EYES, PERSISTENT NAUSEA/VOMITING/DIARRHEA; REPLACES PRAVASTATIN  FOR CHOLESTEROL NOTIFY CLINIC FOR UNUSUAL MUSCLE ACHES OR WEAKNESS, BROWN URINE, BLOOD IN URINE, YELLOWING OF SKIN/WHITES OF EYES, PERSISTENT NAUSEA/VOMITING/DIARRHEA; REPLACES PRAVASTATIN    empagliflozin  25 MG Tabs tablet Commonly known as: Jardiance  Take 1 tablet (25 mg total) by mouth daily.   Entresto 24-26 MG Generic drug: sacubitril-valsartan Take 1 tablet by mouth 2 (two) times daily.   fluticasone -salmeterol 250-50 MCG/ACT Aepb Commonly known as: Wixela Inhub Inhale 1 puff into the lungs in the morning and at bedtime.   gabapentin  300 MG capsule Commonly known as: NEURONTIN  Take 1 capsule (300 mg total) by mouth 2 (two) times daily. What changed: when to take this   guaifenesin  400 MG Tabs tablet Commonly known as: HUMIBID E Take 400 mg by mouth  daily.   Magnesium  Oxide 420 MG Tabs Take 420 mg by mouth daily with supper.   omeprazole  20 MG capsule Commonly known as: PRILOSEC Take 20 mg by mouth daily with supper.   prednisoLONE  acetate 1 % ophthalmic suspension Commonly known as: PRED FORTE  Place 1 drop into both eyes daily.   Spiriva  Respimat 2.5 MCG/ACT Aers Generic drug: Tiotropium Bromide  Monohydrate Inhale 2 puffs into the lungs daily. What changed: how much to take   tamsulosin  0.4 MG Caps capsule Commonly known as: FLOMAX  Take 0.4 mg by mouth daily after supper.   torsemide  20 MG tablet Commonly known as: DEMADEX  Take 1 tablet (20 mg total) by mouth daily. Hold dose for systolic blood pressure less than 100. Take an extra dose in the afternoon dose if weight up 5 lbs in a week What changed:  when to take this additional instructions   vitamin B-12 500 MCG tablet Commonly known as: CYANOCOBALAMIN  Take 500 mcg by mouth every evening.       Consultations: Cardiology   Procedures/Studies:  CT CERVICAL SPINE WO CONTRAST  Result Date: 10/25/2023 CLINICAL DATA:  Head trauma, moderate-severe; Polytrauma, blunt. EXAM: CT HEAD WITHOUT CONTRAST CT CERVICAL SPINE WITHOUT CONTRAST TECHNIQUE: Multidetector CT imaging of the head and cervical spine was performed following the standard protocol without intravenous contrast. Multiplanar CT image reconstructions of the cervical spine were also generated. RADIATION DOSE REDUCTION: This exam was performed according to the departmental dose-optimization program which includes automated exposure control, adjustment of the mA and/or kV according to patient size and/or use of iterative reconstruction technique. COMPARISON:  CT scan head and cervical spine from 05/01/2023. FINDINGS: CT HEAD FINDINGS Brain: No evidence of acute infarction, hemorrhage, hydrocephalus, extra-axial collection or mass lesion/mass effect. There is bilateral periventricular hypodensity, which is non-specific  but most likely seen in the settings of microvascular ischemic changes. Moderate in extent. Otherwise normal appearance of brain parenchyma. Ventricles are normal. Cerebral volume is age appropriate. Vascular: No hyperdense vessel or unexpected calcification. Intracranial arteriosclerosis. Skull: Normal. Negative for fracture or focal lesion. Sinuses/Orbits: No acute finding. Other: Visualized mastoid air cells are unremarkable. No mastoid effusion. CT CERVICAL SPINE FINDINGS Alignment: There is loss of cervical lordosis, which may be positional or due to muscle spasm. This examination does not assess for ligamentous injury or stability. Skull base and vertebrae: No acute fracture. No primary bone lesion or focal pathologic process. Soft tissues and spinal canal: No prevertebral fluid or swelling. No visible canal hematoma. Disc levels: There are moderate multilevel degenerative changes of the cervical spine characterized by reduced intervertebral disc height, facet arthropathy and marginal osteophyte formation. Upper chest: Negative. IMPRESSION: 1. No acute intracranial abnormality. 2. No acute osseous injury or traumatic listhesis of the cervical spine. Electronically Signed   By: Beula Brunswick M.D.   On: 10/25/2023 15:36   CT HEAD WO CONTRAST Result Date: 10/25/2023 CLINICAL DATA:  Head trauma, moderate-severe; Polytrauma, blunt. EXAM: CT HEAD WITHOUT CONTRAST CT CERVICAL SPINE WITHOUT CONTRAST TECHNIQUE: Multidetector CT imaging of the head and cervical spine was performed following the standard protocol without intravenous contrast. Multiplanar CT image reconstructions of the cervical spine were also generated. RADIATION DOSE REDUCTION: This exam was performed according to the departmental dose-optimization program which includes automated exposure control, adjustment of the mA and/or kV according to patient size and/or use of iterative reconstruction technique. COMPARISON:  CT scan head and cervical spine  from 05/01/2023. FINDINGS: CT HEAD FINDINGS Brain: No evidence of acute infarction, hemorrhage, hydrocephalus, extra-axial collection or mass lesion/mass effect. There is bilateral periventricular hypodensity, which is non-specific but most likely seen in the settings of microvascular ischemic changes. Moderate in extent. Otherwise normal appearance of brain parenchyma. Ventricles are normal. Cerebral volume is age appropriate. Vascular: No hyperdense vessel or unexpected calcification. Intracranial arteriosclerosis. Skull: Normal. Negative for fracture or focal lesion. Sinuses/Orbits: No acute finding. Other: Visualized mastoid air cells are unremarkable. No mastoid effusion. CT CERVICAL SPINE FINDINGS Alignment: There is loss of cervical lordosis, which may be positional or due to muscle spasm. This examination does not assess for ligamentous injury or stability. Skull base and vertebrae: No acute fracture. No primary bone lesion or focal pathologic process. Soft tissues and spinal canal: No prevertebral fluid or swelling. No visible canal hematoma. Disc levels: There are moderate multilevel degenerative changes of the cervical spine characterized by reduced intervertebral disc height, facet arthropathy and marginal osteophyte formation. Upper chest: Negative. IMPRESSION: 1. No acute intracranial abnormality. 2. No acute osseous injury or traumatic listhesis of the cervical spine. Electronically Signed   By: Belynda Brace.D.  On: 10/25/2023 15:36   CT CHEST ABDOMEN PELVIS WO CONTRAST Result Date: 10/25/2023 CLINICAL DATA:  Polytrauma, blunt.  Fall. EXAM: CT CHEST, ABDOMEN AND PELVIS WITHOUT CONTRAST TECHNIQUE: Multidetector CT imaging of the chest, abdomen and pelvis was performed following the standard protocol without IV contrast. RADIATION DOSE REDUCTION: This exam was performed according to the departmental dose-optimization program which includes automated exposure control, adjustment of the mA and/or  kV according to patient size and/or use of iterative reconstruction technique. COMPARISON:  CT scan chest, abdomen and pelvis from 05/01/2023. FINDINGS: CT CHEST FINDINGS Cardiovascular: Normal cardiac size. No pericardial effusion. No aortic aneurysm. There are coronary artery calcifications, in keeping with coronary artery disease. There are also mild peripheral atherosclerotic vascular calcifications of thoracic aorta and its major branches. Mediastinum/Nodes: Visualized thyroid  gland appears grossly unremarkable. No solid / cystic mediastinal masses. The esophagus is nondistended precluding optimal assessment. No mediastinal or axillary lymphadenopathy by size criteria. Evaluation of bilateral hila is limited due to lack on intravenous contrast: however, no large hilar lymphadenopathy identified. Lungs/Pleura: The central tracheo-bronchial tree is patent. No mass or consolidation. No pleural effusion or pneumothorax. No suspicious lung nodules. Musculoskeletal: The visualized soft tissues of the chest wall are grossly unremarkable. No suspicious osseous lesions. There are mild multilevel degenerative changes in the visualized spine. Multiple old, healed, right-sided rib fractures noted. CT ABDOMEN PELVIS FINDINGS Hepatobiliary: The liver is normal in size. Non-cirrhotic configuration. No suspicious mass. No intrahepatic or extrahepatic bile duct dilation. Very small volume noncalcified gallstone noted without imaging signs of acute cholecystitis. Normal gallbladder wall thickness. No pericholecystic inflammatory changes. Pancreas: Unremarkable. No pancreatic ductal dilatation or surrounding inflammatory changes. Spleen: Within normal limits. No focal lesion. Adrenals/Urinary Tract: Adrenal glands are unremarkable. No suspicious renal mass within the limitations of this unenhanced exam. No nephroureterolithiasis or obstructive uropathy. Unremarkable urinary bladder. Stomach/Bowel: No disproportionate dilation of  the small or large bowel loops. No evidence of abnormal bowel wall thickening or inflammatory changes. The appendix is unremarkable. There are multiple diverticula mainly in the sigmoid colon, without imaging signs of diverticulitis. Vascular/Lymphatic: No ascites or pneumoperitoneum. No abdominal or pelvic lymphadenopathy, by size criteria. No aneurysmal dilation of the major abdominal arteries. There are mild peripheral atherosclerotic vascular calcifications of the aorta and its major branches. Reproductive: Normal size prostate. Symmetric seminal vesicles. Fiducial markers noted in the prostate. Other: There is a tiny fat containing umbilical hernia. The soft tissues and abdominal wall are otherwise unremarkable. Musculoskeletal: No suspicious osseous lesions. There are mild - moderate multilevel degenerative changes in the visualized spine. IMPRESSION: 1. No acute traumatic injury to the chest, abdomen or pelvis. 2. Multiple other nonacute observations, as described above. Aortic Atherosclerosis (ICD10-I70.0). Electronically Signed   By: Beula Brunswick M.D.   On: 10/25/2023 15:19   DG Chest Port 1 View Result Date: 10/25/2023 CLINICAL DATA:  Trauma. EXAM: PORTABLE CHEST 1 VIEW COMPARISON:  Chest radiograph dated 08/26/2023 FINDINGS: No focal consolidation, pleural effusion or pneumothorax. There is mild cardiomegaly and mild vascular congestion. No acute osseous pathology. IMPRESSION: 1. No focal consolidation. 2. Mild cardiomegaly and mild vascular congestion. Electronically Signed   By: Angus Bark M.D.   On: 10/25/2023 13:52    Subjective: - no chest pain, shortness of breath, no abdominal pain, nausea or vomiting.   Discharge Exam: BP (!) 151/60 (BP Location: Left Arm)   Pulse (!) 57   Temp 97.9 F (36.6 C) (Oral)   Resp 15   SpO2 99%   General:  Pt is alert, awake, not in acute distress Cardiovascular: RRR, S1/S2 +, no rubs, no gallops Respiratory: CTA bilaterally, no wheezing, no  rhonchi Abdominal: Soft, NT, ND, bowel sounds + Extremities: no edema, no cyanosis    The results of significant diagnostics from this hospitalization (including imaging, microbiology, ancillary and laboratory) are listed below for reference.     Microbiology: No results found for this or any previous visit (from the past 240 hours).   Labs: Basic Metabolic Panel: Recent Labs  Lab 10/25/23 1339 10/26/23 0314 10/27/23 0352  NA 129* 133* 135  K 3.5 3.3* 3.8  CL 95* 102 103  CO2 19* 23 25  GLUCOSE 117* 139* 140*  BUN 34* 30* 22  CREATININE 1.95* 1.91* 1.30*  CALCIUM  10.4* 10.1 10.2  MG  --   --  2.4   Liver Function Tests: Recent Labs  Lab 10/25/23 1339 10/26/23 0314  AST 18 19  ALT 21 21  ALKPHOS 88 68  BILITOT 0.8 0.8  PROT 6.4* 5.3*  ALBUMIN 3.5 2.8*   CBC: Recent Labs  Lab 10/25/23 1339 10/26/23 0314 10/27/23 0352  WBC 7.3 5.2 5.1  NEUTROABS 5.1  --   --   HGB 13.2 11.3* 11.6*  HCT 41.7 35.5* 36.7*  MCV 94.1 93.7 93.4  PLT 213 185 193   CBG: No results for input(s): "GLUCAP" in the last 168 hours. Hgb A1c No results for input(s): "HGBA1C" in the last 72 hours. Lipid Profile No results for input(s): "CHOL", "HDL", "LDLCALC", "TRIG", "CHOLHDL", "LDLDIRECT" in the last 72 hours. Thyroid  function studies No results for input(s): "TSH", "T4TOTAL", "T3FREE", "THYROIDAB" in the last 72 hours.  Invalid input(s): "FREET3" Urinalysis    Component Value Date/Time   COLORURINE YELLOW 05/01/2023 1521   APPEARANCEUR CLEAR 05/01/2023 1521   LABSPEC 1.005 05/01/2023 1521   PHURINE 5.0 05/01/2023 1521   GLUCOSEU 50 (A) 05/01/2023 1521   HGBUR NEGATIVE 05/01/2023 1521   BILIRUBINUR NEGATIVE 05/01/2023 1521   KETONESUR NEGATIVE 05/01/2023 1521   PROTEINUR NEGATIVE 05/01/2023 1521   UROBILINOGEN 0.2 03/24/2013 1255   NITRITE NEGATIVE 05/01/2023 1521   LEUKOCYTESUR NEGATIVE 05/01/2023 1521    FURTHER DISCHARGE INSTRUCTIONS:   Get Medicines reviewed and  adjusted: Please take all your medications with you for your next visit with your Primary MD   Laboratory/radiological data: Please request your Primary MD to go over all hospital tests and procedure/radiological results at the follow up, please ask your Primary MD to get all Hospital records sent to his/her office.   In some cases, they will be blood work, cultures and biopsy results pending at the time of your discharge. Please request that your primary care M.D. goes through all the records of your hospital data and follows up on these results.   Also Note the following: If you experience worsening of your admission symptoms, develop shortness of breath, life threatening emergency, suicidal or homicidal thoughts you must seek medical attention immediately by calling 911 or calling your MD immediately  if symptoms less severe.   You must read complete instructions/literature along with all the possible adverse reactions/side effects for all the Medicines you take and that have been prescribed to you. Take any new Medicines after you have completely understood and accpet all the possible adverse reactions/side effects.    Do not drive when taking Pain medications or sleeping medications (Benzodaizepines)   Do not take more than prescribed Pain, Sleep and Anxiety Medications. It is not advisable to combine anxiety,sleep and  pain medications without talking with your primary care practitioner   Special Instructions: If you have smoked or chewed Tobacco  in the last 2 yrs please stop smoking, stop any regular Alcohol   and or any Recreational drug use.   Wear Seat belts while driving.   Please note: You were cared for by a hospitalist during your hospital stay. Once you are discharged, your primary care physician will handle any further medical issues. Please note that NO REFILLS for any discharge medications will be authorized once you are discharged, as it is imperative that you return to your  primary care physician (or establish a relationship with a primary care physician if you do not have one) for your post hospital discharge needs so that they can reassess your need for medications and monitor your lab values.  Time coordinating discharge: 40 minutes  SIGNED:  Kathlen Para, MD, PhD 10/27/2023, 12:51 PM

## 2023-10-27 NOTE — Discharge Instructions (Signed)
 We are changing medications around, both to try to simplify the regimen and also to try to protect your heart as best we can. Please note the following:  Medications: -continue Jardiance  25 mg daily -stop spironolactone , lisinopril , amlodipine , hydralazine  for now. We may add back in the future depending on your blood pressure and kidney function at your follow up visit. Stop carvedilol  for now pending monitor results -start entresto 24-26 mg twice a day -change to torsemide  20 mg daily with extra afternoon dose if weight up 5 lbs in a week  Please track your weight and blood pressure. If your blood pressure is consistently more than 140/90, please let the cardiology office know.   We will need to monitor your kidney function closely. Please have your kidney function checked in the next 7-10 days.  If you have worsening shortness of breath, weight gain, or fluid retention on this regimen, please call the cardiology office.

## 2023-10-27 NOTE — Progress Notes (Signed)
 Discharge instructions reviewed with pt. ZIO patch has been applied by EKG department staff. (Call placed to EKG dept to ask a question about his ZIO patch)   Read instructions to pt, pt verbalizes understanding of instructions and knows when to follow up and about having labs rechecked as directed.  Copy of instructions given to pt. Wife will be here in the next few minutes to pick him up. Pt getting dressed.    Pt will be d/c'd via wheelchair with belongings and will be escorted by staff/volunteer.   Fleta Human RN SWOT

## 2023-10-27 NOTE — Progress Notes (Signed)
 Rounding Note    Patient Name: Francis Cardenas Date of Encounter: 10/27/2023  [facility] Cardiologist: Sheryle Donning, MD   Subjective   Frustrated this AM, had extensive discussion about his concerns, was threatening to leave AMA. Overall he does not have cardiac complaints. Discussed changing his medication regimen, see below.  Inpatient Medications    Scheduled Meds:  apixaban   5 mg Oral BID   atorvastatin   40 mg Oral Daily   fluticasone  furoate-vilanterol  1 puff Inhalation Daily   tamsulosin   0.4 mg Oral QPC supper   umeclidinium bromide   1 puff Inhalation Daily   Continuous Infusions:  PRN Meds: acetaminophen  **OR** acetaminophen , albuterol , ondansetron  **OR** ondansetron  (ZOFRAN ) IV   Vital Signs    Vitals:   10/26/23 2309 10/27/23 0523 10/27/23 0805 10/27/23 0934  BP: (!) 137/49 129/64 (!) 162/66 (!) 151/60  Pulse: (!) 58 (!) 55 70 (!) 57  Resp: 16 14 15 15   Temp: 98.1 F (36.7 C) 97.7 F (36.5 C) 97.9 F (36.6 C)   TempSrc: Oral Oral Oral   SpO2: 97% 97% 98% 99%    Intake/Output Summary (Last 24 hours) at 10/27/2023 1235 Last data filed at 10/27/2023 0758 Gross per 24 hour  Intake 120 ml  Output 2200 ml  Net -2080 ml      09/11/2023   10:57 AM 08/31/2023    3:57 AM 08/30/2023    6:16 AM  Last 3 Weights  Weight (lbs) 303 lb 297 lb 9.9 oz 302 lb 11.1 oz  Weight (kg) 137.44 kg 135 kg 137.3 kg      Telemetry    Rate controlled afib, no significant pauses - Personally Reviewed  Physical Exam   GEN: No acute distress.   Neck: No JVD Cardiac: irregularly irregular, no murmurs, rubs, or gallops.  Respiratory: Clear to auscultation bilaterally. GI: Soft, nontender, non-distended  MS: mild bilateral LE edema; No deformity. Neuro:  Nonfocal  Psych: Normal affect   New pertinent results (labs, ECG, imaging, cardiac studies)    None  Assessment & Plan    Falls: likely 2/2 alcohol  use, counseled on cessation  Permanent afib:  aiming for rate control, not rhythm control. Was bradycardic on presentation, carvedilol  held, has remained rate controlled. -CHA2DS2/VAS Stroke Risk Points=4, though with frequent falls, at increased risk of complications from anticoagulation -with bradycardia, we discussed 2 week Zio monitor placed prior to discharge, he is amenable -needs alcohol  cessation, which will be difficult for him, but encouraged him to try   AKI on CKD stage 3a LE edema Chronic diastolic heart failure Hypoalbuminemia Hyponatremia -most meds on hold: Jardiance , hydralazine , lisinopril , spironolactone , torsemide , amlodipine  -reports he is at baseline LE edema -hypoalbuminemia, hyponatremia likely exacerbated by chronic alcohol  use -Cr baseline 1.5 most recently, peaked at 1.95 and down to 1.3 -Would like to simplify regimen and also get him on better GDMT. Fluctuating renal function has been an issue Will make the following changes: -continue Jardiance  25 mg daily -stop spironolactone , lisinopril , amlodipine , hydralazine  for now. Stop carvedilol  for now pending monitor results -start entresto 24-26 mg BID -change to torsemide  20 mg daily with extra afternoon dose if weight up 5 lbs in a week  Needs BMET in 1 week. We will arrange for outpatient cardiology follow up. Will place info on med changes in his discharge instructions so that he can adjust his regimen when he gets home.   Needs follow up with PCP and nephrology if not already scheduled.  Total time  of encounter: I spent 50 minutes dedicated to the care of this patient on the date of this encounter to include pre-visit review of records, face-to-face time with the patient discussing conditions above, and clinical documentation with the electronic health record. We specifically spent time today discussing med changes, what to watch for, plans for follow up   Signed, Sheryle Donning, MD  10/27/2023, 12:35 PM

## 2023-10-27 NOTE — TOC Transition Note (Signed)
 Transition of Care (TOC) - Discharge Note Sherin Dingwall RN, BSN Transitions of Care Unit 4E- RN Case Manager See Treatment Team for direct phone #   Patient Details  Name: Francis Cardenas MRN: [id number] Date of Birth: March 23, 1951  Transition of Care [facility]) CM/SW Contact:  Rox Cope, RN Phone Number: 10/27/2023, 1:17 PM   Clinical Narrative:    Pt stable for transition home today, order placed for HHPT.   CM in to speak with pt at bedside, discussed HH and DME needs. Per pt he has needed DME at home, declines any new DME needs- states he does not needs shower chair.  Pt also voiced that he has had HH in past and does not need HH at this time- declines HH referral. Voiced he did well with therapy here and does not need PT follow up.   Pt lives at home w/ wife. Voiced his wife will come transport him home.   No TOC needs identified.    Final next level of care: Home/Self Care Barriers to Discharge: Barriers Resolved   Patient Goals and CMS Choice Patient states their goals for this hospitalization and ongoing recovery are:: return home CMS Medicare.gov Compare Post Acute Care list provided to:: Patient Choice offered to / list presented to : Patient      Discharge Placement               Home        Discharge Plan and Services Additional resources added to the After Visit Summary for     Discharge Planning Services: CM Consult Post Acute Care Choice: Home Health          DME Arranged: N/A DME Agency: NA       HH Arranged: PT, Patient Refused HH HH Agency: NA        Social Drivers of Health (SDOH) Interventions SDOH Screenings   Food Insecurity: No Food Insecurity (10/27/2023)  Housing: Low Risk  (10/27/2023)  Transportation Needs: No Transportation Needs (10/27/2023)  Utilities: Not At Risk (10/27/2023)  Alcohol  Screen: Low Risk  (12/26/2022)  Financial Resource Strain: Low Risk  (12/26/2022)  Social Connections: Socially Isolated (10/27/2023)  Tobacco  Use: Medium Risk (09/11/2023)     Readmission Risk Interventions    09/02/2021   11:32 AM  Readmission Risk Prevention Plan  Transportation Screening Complete  PCP or Specialist Appt within 3-5 Days --  HRI or Home Care Consult Complete  Palliative Care Screening Not Applicable  Medication Review (RN Care Manager) Complete

## 2023-10-27 NOTE — Telephone Encounter (Signed)
 Paged by I rhythm during after hour, reporting patient had auto triggered event with first recording of atrial fibrillation with ventricular rate of 58-75 bpm. Patient has permanent atrial fibrillation through chart review.  No change of plan at this time.

## 2023-10-30 ENCOUNTER — Telehealth (HOSPITAL_BASED_OUTPATIENT_CLINIC_OR_DEPARTMENT_OTHER): Payer: Self-pay | Admitting: Family

## 2023-10-30 NOTE — Telephone Encounter (Signed)
 Pt called asking about getting kidney function tested within 7 to 10 days. Page 9 of his discharge form. Please call and advise.

## 2023-11-27 NOTE — Addendum Note (Signed)
 Encounter addended by: TRUE Shackleford N on: 11/27/2023 10:29 AM  Actions taken: Imaging Exam ended

## 2023-11-29 ENCOUNTER — Ambulatory Visit (INDEPENDENT_AMBULATORY_CARE_PROVIDER_SITE_OTHER): Admitting: Family Medicine

## 2023-11-29 ENCOUNTER — Encounter (HOSPITAL_BASED_OUTPATIENT_CLINIC_OR_DEPARTMENT_OTHER): Payer: Self-pay | Admitting: Family Medicine

## 2023-11-29 VITALS — BP 135/78 | HR 69 | Ht 70.0 in | Wt 298.1 lb

## 2023-11-29 DIAGNOSIS — E669 Obesity, unspecified: Secondary | ICD-10-CM | POA: Diagnosis not present

## 2023-11-29 DIAGNOSIS — E1169 Type 2 diabetes mellitus with other specified complication: Secondary | ICD-10-CM | POA: Diagnosis not present

## 2023-11-29 DIAGNOSIS — D649 Anemia, unspecified: Secondary | ICD-10-CM | POA: Insufficient documentation

## 2023-11-29 DIAGNOSIS — I10 Essential (primary) hypertension: Secondary | ICD-10-CM | POA: Diagnosis not present

## 2023-11-29 DIAGNOSIS — F1010 Alcohol abuse, uncomplicated: Secondary | ICD-10-CM | POA: Diagnosis not present

## 2023-11-29 NOTE — Assessment & Plan Note (Signed)
 Blood pressure appropriate in office today.  He did have recent fall about 1 month ago and medications were adjusted at that time as low blood pressure was felt to be a contributing factor.  Management is complicated by chronic medical conditions as well as alcohol  abuse.  Can continue with current medication regimen, no changes made today.  Recommend intermittent monitoring of blood pressure at home, DASH diet

## 2023-11-29 NOTE — Assessment & Plan Note (Signed)
 Recent hemoglobin A1c has been well-controlled.  He is currently taking Jardiance  which assist with blood sugar control as well as underlying heart failure. Patient does have notable cardiac history as well as underlying sleep apnea.  Given this, patient likely would have benefit of starting GLP-1 receptor agonist, likely options in this regard would be Ozempic or Mounjaro.  Can discuss further at future office visit Plan to complete foot exam at future office visit

## 2023-11-29 NOTE — Patient Instructions (Signed)
   Medication Instructions:  Your physician recommends that you continue on your current medications as directed. Please refer to the Current Medication list given to you today. --If you need a refill on any your medications before your next appointment, please call your pharmacy first. If no refills are authorized on file call the office.--  Follow-Up: Your next appointment:   Your physician recommends that you schedule a follow-up appointment in: 3-4 month follow up  with Dr. de Peru  You will receive a text message or e-mail with a link to a survey about your care and experience with [place] today! We would greatly appreciate your feedback!   Thanks for letting us be apart of your health journey!!  Primary Care and Sports Medicine   Dr. Ceasar Mons Peru   We encourage you to activate your patient portal called "MyChart".  Sign up information is provided on this After Visit Summary.  MyChart is used to connect with patients for Virtual Visits (Telemedicine).  Patients are able to view lab/test results, encounter notes, upcoming appointments, etc.  Non-urgent messages can be sent to your provider as well. To learn more about what you can do with MyChart, please visit --  [email or web address].

## 2023-11-29 NOTE — Progress Notes (Signed)
 New Patient Office Visit  Subjective   Patient ID: Francis Cardenas, male    DOB: Aug 02, 1950  Age: 73 y.o. MRN: [id number]  CC:  Chief Complaint  Patient presents with   New Patient (Initial Visit)    New Patient - was being seen at [facility]     HPI Francis Cardenas presents to establish care Last PCP - [facility] [place]; also was being seen by primary care provider through [facility]  DM with neuropathy: takes Jardiance  for DM and HF. He does take gabapentin  for management of underlying neuropathy. No other medications utilized in the past.  Patient also follows with cardiology related to hypertension, atrial fibrillation, chronic diastolic heart failure.  Did see cardiology here a few months ago, also follows with cardiology at [facility]. Current medications include apixaban , atorvastatin , empagliflozin , torsemide .  Prior cardiology notes also indicate hydralazine  and carvedilol  - these were stopped after recent hospitalization for fall about 1 month ago.  Did have hospitalization a few months ago related to acute on chronic CHF.  Has had ongoing difficulty with alcohol  dependence. At times he has drank 18 beers/day - currently drinking about 8-10 beers per day but some concerns that this is worsening with some days increased to 12/day. Family history of alcohol  abuse - reports his father drank heavily. 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  Was referred to nephrology due to underlying CKD. He indicates that he was called to schedule this appointment and he declined because follow-up labs had indicated that his creatinine had improved.  Patient is originally from [place]. Was in the [employer].  Outpatient Encounter Medications as of 11/29/2023  Medication Sig   acetaminophen  (TYLENOL ) 325 MG tablet Take 2 tablets (650 mg total) by mouth every 6 (six) hours as needed.   albuterol  (PROVENTIL  HFA;VENTOLIN  HFA) 108 (90 BASE) MCG/ACT inhaler Inhale 2  puffs into the lungs every 6 (six) hours as needed for wheezing or shortness of breath.   allopurinol  (ZYLOPRIM ) 100 MG tablet Take 100 mg by mouth daily.   allopurinol  (ZYLOPRIM ) 300 MG tablet Take 300 mg by mouth daily.   ALPRAZolam  (XANAX ) 1 MG tablet Take 1 tablet (1 mg total) by mouth at bedtime.   apixaban  (ELIQUIS ) 5 MG TABS tablet Take 1 tablet (5 mg total) by mouth 2 (two) times daily.   atorvastatin  (LIPITOR) 80 MG tablet Take 40 mg by mouth daily with supper. NOTIFY CLINIC FOR UNUSUAL MUSCLE ACHES OR WEAKNESS, BROWN URINE, BLOOD IN URINE, YELLOWING OF SKIN/WHITES OF EYES, PERSISTENT NAUSEA/VOMITING/DIARRHEA; REPLACES PRAVASTATIN  FOR CHOLESTEROL NOTIFY CLINIC FOR UNUSUAL MUSCLE ACHES OR WEAKNESS, BROWN URINE, BLOOD IN URINE, YELLOWING OF SKIN/WHITES OF EYES, PERSISTENT NAUSEA/VOMITING/DIARRHEA; REPLACES PRAVASTATIN    empagliflozin  (JARDIANCE ) 25 MG TABS tablet Take 1 tablet (25 mg total) by mouth daily.   fluticasone -salmeterol (WIXELA INHUB) 250-50 MCG/ACT AEPB Inhale 1 puff into the lungs in the morning and at bedtime.   gabapentin  (NEURONTIN ) 300 MG capsule Take 1 capsule (300 mg total) by mouth 2 (two) times daily.   guaifenesin  (HUMIBID E) 400 MG TABS tablet Take 400 mg by mouth daily.   Magnesium  Oxide 420 MG TABS Take 420 mg by mouth daily with supper.   omeprazole  (PRILOSEC) 20 MG capsule Take 20 mg by mouth daily with supper.   prednisoLONE  acetate (PRED FORTE ) 1 % ophthalmic suspension Place 1 drop into both eyes daily.   tamsulosin  (FLOMAX ) 0.4 MG CAPS capsule Take 0.4 mg by mouth daily  after supper.   Tiotropium Bromide  Monohydrate (SPIRIVA  RESPIMAT) 2.5 MCG/ACT AERS Inhale 2 puffs into the lungs daily.   torsemide  (DEMADEX ) 20 MG tablet Take 1 tablet (20 mg total) by mouth daily. Hold dose for systolic blood pressure less than 100. Take an extra dose in the afternoon dose if weight up 5 lbs in a week   vitamin B-12 (CYANOCOBALAMIN ) 500 MCG tablet Take 500 mcg by mouth every  evening.   No facility-administered encounter medications on file as of 11/29/2023.    Past Medical History:  Diagnosis Date   Alcohol  dependence [facility])    Allergy [id number]   Asthma   Anxiety    Arthritis    Asthma    BPH (benign prostatic hyperplasia)    Cancer (HCC)    prostate   Cataract 2021   CHF (congestive heart failure) (HCC)    COPD (chronic obstructive pulmonary disease) (HCC)    Diabetes mellitus without complication (HCC)    Dyspnea on exertion 07/14/2022   Elevated troponin 04/06/2019   Fall at home, initial encounter 09/14/2022   GERD (gastroesophageal reflux disease)    Heart murmur    Hypercholesteremia    Hypertension    Myocardial infarction (HCC)    Neuromuscular disorder (HCC) 1989   OSA on CPAP    Sleep apnea 2002    Past Surgical History:  Procedure Laterality Date   ACHILLES TENDON REPAIR Left    CARDIAC CATHETERIZATION     CARPAL TUNNEL RELEASE Right    EYE SURGERY  2022   KNEE SURGERY Left    RIGHT/LEFT HEART CATH AND CORONARY ANGIOGRAPHY N/A 04/09/2019   Procedure: RIGHT/LEFT HEART CATH AND CORONARY ANGIOGRAPHY;  Surgeon: [place], Peter M, MD;  Location: [facility] INVASIVE CV LAB;  Service: Cardiovascular;  Laterality: N/A;   TONSILLECTOMY     WRIST FRACTURE SURGERY Left     Family History  Problem Relation Age of Onset   CAD Mother    Leukemia Mother    Cancer Mother    Depression Mother    Heart attack Father    Alcohol  abuse Father    Heart disease Father    Obesity Father    Stroke Father    Heart attack Brother    Obesity Brother    Early death Brother    Heart disease Brother     Social History   Socioeconomic History   Marital status: Married    Spouse name: Francis Cardenas   Number of children: 1   Years of education: Not on file   Highest education level: Bachelor's degree (e.g., BA, AB, BS)  Occupational History   Occupation: retired  Tobacco Use   Smoking status: Former    Current packs/day: 0.00    Average packs/day: 1  pack/day for 13.0 years (13.0 ttl pk-yrs)    Types: Cigarettes    Start date: 49    Quit date: 02/21/1979    Years since quitting: 44.8    Passive exposure: Past   Smokeless tobacco: Never   Tobacco comments:    Quit in 1980  Vaping Use   Vaping status: Never Used  Substance and Sexual Activity   Alcohol  use: Yes    Alcohol /week: 8.0 standard drinks of alcohol     Types: 8 Cans of beer per week    Comment: Daily   Drug use: No   Sexual activity: Not Currently  Other Topics Concern   Not on file  Social History Narrative   Not on file   Social  Drivers of Corporate investment banker Strain: Low Risk  (11/25/2023)   Overall Financial Resource Strain (CARDIA)    Difficulty of Paying Living Expenses: Not hard at all  Food Insecurity: No Food Insecurity (11/25/2023)   Hunger Vital Sign    Worried About Running Out of Food in the Last Year: Never true    Ran Out of Food in the Last Year: Never true  Transportation Needs: Unknown (11/25/2023)   PRAPARE - Transportation    Lack of Transportation (Medical): No    Lack of Transportation (Non-Medical): Patient declined  Physical Activity: Inactive (11/25/2023)   Exercise Vital Sign    Days of Exercise per Week: 0 days    Minutes of Exercise per Session: Not on file  Stress: Stress Concern Present (11/25/2023)   [employer] of Occupational Health - Occupational Stress Questionnaire    Feeling of Stress: To some extent  Social Connections: Socially Isolated (11/25/2023)   Social Connection and Isolation Panel    Frequency of Communication with Friends and Family: Never    Frequency of Social Gatherings with Friends and Family: Never    Attends Religious Services: Patient declined    Database administrator or Organizations: No    Attends Engineer, structural: Not on file    Marital Status: Married  Catering manager Violence: Not At Risk (10/27/2023)   Humiliation, Afraid, Rape, and Kick questionnaire    Fear of Current or  Ex-Partner: No    Emotionally Abused: No    Physically Abused: No    Sexually Abused: No    Objective   BP 135/78 (BP Location: Right Arm, Patient Position: Sitting, Cuff Size: Normal)   Pulse 69   Ht 5' 10 (1.778 m)   Wt 298 lb 1.6 oz (135.2 kg)   SpO2 96%   BMI 42.77 kg/m   Physical Exam  73 year old male in no acute distress Cardiovascular exam with irregularly irregular rhythm, normal rate Lungs clear to auscultation bilaterally  Assessment & Plan:   Type 2 diabetes mellitus with obesity (HCC) Assessment & Plan: Recent hemoglobin A1c has been well-controlled.  He is currently taking Jardiance  which assist with blood sugar control as well as underlying heart failure. Patient does have notable cardiac history as well as underlying sleep apnea.  Given this, patient likely would have benefit of starting GLP-1 receptor agonist, likely options in this regard would be Ozempic or Mounjaro.  Can discuss further at future office visit Plan to complete foot exam at future office visit  Orders: -     Microalbumin / creatinine urine ratio  Alcohol  use disorder Assessment & Plan: Patient had stopped drinking after hospitalization in April, however has resumed regular consumption of beer unfortunately.  Presently drinks about 8-12 beers per day.  Has felt that it has been gradually increasing recently.  Patient is aware of need to stop drinking, but has had a lot of difficulty in doing so.  Again reviewed importance of avoidance of alcohol , risk with continued significant alcohol  intake.  Will continue to assess readiness to quit drinking   Benign essential hypertension Assessment & Plan: Blood pressure appropriate in office today.  He did have recent fall about 1 month ago and medications were adjusted at that time as low blood pressure was felt to be a contributing factor.  Management is complicated by chronic medical conditions as well as alcohol  abuse.  Can continue with current  medication regimen, no changes made today.  Recommend intermittent monitoring of  blood pressure at home, DASH diet   Return in about 3 months (around 02/29/2024) for hypertension, diabetes.   Spent 53 minutes on this patient encounter, including preparation, chart review, face-to-face counseling with patient and coordination of care, and documentation of encounter   ___________________________________________ Tonie Elsey de [place], MD, ABFM, [facility] Primary Care and Sports Medicine [facility]

## 2023-11-29 NOTE — Assessment & Plan Note (Signed)
 Patient had stopped drinking after hospitalization in April, however has resumed regular consumption of beer unfortunately.  Presently drinks about 8-12 beers per day.  Has felt that it has been gradually increasing recently.  Patient is aware of need to stop drinking, but has had a lot of difficulty in doing so.  Again reviewed importance of avoidance of alcohol , risk with continued significant alcohol  intake.  Will continue to assess readiness to quit drinking

## 2023-11-30 ENCOUNTER — Ambulatory Visit (HOSPITAL_BASED_OUTPATIENT_CLINIC_OR_DEPARTMENT_OTHER): Payer: Self-pay | Admitting: Family Medicine

## 2023-11-30 LAB — MICROALBUMIN / CREATININE URINE RATIO
Creatinine, Urine: 27.9 mg/dL
Microalb/Creat Ratio: 61 mg/g{creat} — ABNORMAL HIGH (ref 0–29)
Microalbumin, Urine: 17 ug/mL

## 2023-12-01 ENCOUNTER — Ambulatory Visit (HOSPITAL_BASED_OUTPATIENT_CLINIC_OR_DEPARTMENT_OTHER): Admitting: Family

## 2023-12-01 ENCOUNTER — Encounter (HOSPITAL_BASED_OUTPATIENT_CLINIC_OR_DEPARTMENT_OTHER): Payer: Self-pay | Admitting: Family

## 2023-12-01 VITALS — BP 123/52 | HR 91 | Ht 70.0 in | Wt 295.0 lb

## 2023-12-01 DIAGNOSIS — D6859 Other primary thrombophilia: Secondary | ICD-10-CM

## 2023-12-01 DIAGNOSIS — G4733 Obstructive sleep apnea (adult) (pediatric): Secondary | ICD-10-CM

## 2023-12-01 DIAGNOSIS — I5032 Chronic diastolic (congestive) heart failure: Secondary | ICD-10-CM

## 2023-12-01 DIAGNOSIS — I4821 Permanent atrial fibrillation: Secondary | ICD-10-CM

## 2023-12-01 DIAGNOSIS — R5383 Other fatigue: Secondary | ICD-10-CM

## 2023-12-01 MED ORDER — SACUBITRIL-VALSARTAN 24-26 MG PO TABS: 1.0000 | ORAL_TABLET | Freq: Two times a day (BID) | ORAL | 11 refills | Status: DC

## 2023-12-01 NOTE — Progress Notes (Signed)
 Cardiology Office Note:  .   Date:  12/01/2023  ID:  Francis Cardenas, DOB 1950/09/24, MRN [id number] PCP: de [place], Francis J, MD  [facility] Providers Cardiologist:  Francis Bruckner, MD    History of Present Illness: .   Francis Cardenas is a 73 y.o. male with hx of HFpEF, CKDIIIb, BPH, permanent atrial fibrillation, morbid obesity, OSA on CPAP, DM2, diabetic polyneuropathy, GERD, HTN, etoh use  Echo 12/25/22 LVEF 60 to 65%, no RWMA, mild LVH, indeterminate diastolic parameters in the setting of atrial fibrillation, RV SF mildly reduced, RV moderately enlarged, mildly elevated PASP, small pericardial effusion, trivial MR.  Admitted 4/5-4/10/25 for  acute on chronic diastolic heart failure. Diuresed 10.6L with weight down 17 lbs. noted that he continued drinking 8-9 beers per day starting in the morning.  Discharged on torsemide  20 mg twice daily, hydralazine  25 mg daily, Jardiance  25 mg daily, carvedilol  6.25 mg twice daily.  Creatinine prior to discharge improved to 1.46. Discharge weight 302 lbs.   Last seen 09/11/23 doing overall well post hospitalization with improvement in LE edema. No changes made at that time. He reported ongoing fatigue and was encouraged to gradually increase physical activity.  Admitted 6/4-10/27/23 with recurrent falls noted to be heavily drinking though this was not new from previous and very slow A-fib.  Carvedilol  held due to bradycardia and heart rate in the 30s during sleep.  Due to hyponatremia, spironolactone  was discontinued.  He was recommended to remain off lisinopril , amlodipine , hydralazine .  Jardiance  25 mg daily continued.  Monitor worn as 2 parts. Initial monitor with 1 9 beat run of VT at 182 bpm which was asymptomatic.  100% atrial fibrillation burden average 66bpm with no significant pauses.  3.1% PVC burden.  Second monitor with 100% atrial fibrillation burden 45-115 bpm with average 71 bpm with no significant pauses.  3.4% PVC  burden.  Discussed the use of AI scribe software for clinical note transcription with the patient, who gave verbal consent to proceed.  History of Present Illness Francis Cardenas is a 73 year old male with heart failure who presents with fatigue and medication management issues.  He experiences persistent fatigue despite adequate rest, with a later wake-up time than usual. He has nocturia, needing to urinate a couple of times at night.   Taking Torsemide  daily, not requiring additional doses. Reports LE edema much improved. He is concerned about Entresto  refills as called his primary care at [facility] and was told they would not refill would need to be cardiology at [facility] or community care cardiology. Reassured we would refill today.     ROS: Please see the history of present illness.    All other systems reviewed and are negative.   Studies Reviewed: .        Cardiac Studies & Procedures   ______________________________________________________________________________________________ CARDIAC CATHETERIZATION  CARDIAC CATHETERIZATION 04/09/2019  Conclusion  LV end diastolic pressure is moderately elevated.  The left ventricular systolic function is normal.  The left ventricular ejection fraction is 55-65% by visual estimate.  There is trivial (1+) mitral regurgitation.  There is no aortic valve stenosis.  Hemodynamic findings consistent with moderate pulmonary hypertension.  1. Normal coronary anatomy 2. Normal LV systolic function 3. Moderately elevated LV filling pressures. Large V waves to 58 mm Hg c/w LV restriction. No significant MR noted. 4. No AV gradient. 5. Moderate pulmonary HTN 6. Preserved cardiac output. Index 3.  Findings Coronary Findings Diagnostic  Dominance: Right  Left  Main Vessel was injected. Vessel is normal in caliber. Vessel is angiographically normal.  Left Anterior Descending Vessel was injected. Vessel is normal in caliber. Vessel is  angiographically normal.  Left Circumflex Vessel was injected. Vessel is normal in caliber. Vessel is angiographically normal.  Right Coronary Artery Vessel was injected. Vessel is normal in caliber. Vessel is angiographically normal.  Intervention  No interventions have been documented.   STRESS TESTS  NM MYOCAR MULTI W/SPECT W 04/08/2019  Narrative  There was no ST segment deviation noted during stress.  The left ventricular ejection fraction is mildly decreased (45-54%).  This is an intermediate risk study.  There is a fixed perfusion defect of moderate severity present in the inferior/inferolateral segments base to apex.  This defect is moderate in size and comprises roughly 15-20% of the LV myocardium.  The wall motion in this region remains normal, and there is significant gut uptake artifact present on the study.  Overall, this likely reflects artifact due to increased radiotracer in the gut, but unable to exclude ischemia in this region.  There is no evidence of reversible ischemia in the other segments.  Impression:  1.  Equivocal myocardial perfusion imaging study with significant gut uptake artifact.  Indeterminate for ischemia in the inferior/inferolateral segments as described above. 2.  Mildly dilated left ventricular cavity with mildly reduced ejection fraction, 45%.  Francis T. Barbaraann, MD [facility] [address] [place], [place] [address] [phone] 4:35 PM   ECHOCARDIOGRAM  ECHOCARDIOGRAM COMPLETE 12/25/2022  Narrative ECHOCARDIOGRAM REPORT    Patient Name:   Francis Cardenas Date of Exam: 12/25/2022 Medical Rec #:  [id number]      Height:       70.0 in Accession #:    [id number]     Weight:       304.2 lb Date of Birth:  February 10, 1951     BSA:          2.495 m Patient Age:    71 years       BP:           106/44 mmHg Patient Gender: M              HR:           60 bpm. Exam Location:  Inpatient  Procedure: 2D Echo,  Cardiac Doppler, Color Doppler and Intracardiac Opacification Agent  Indications:    CHF-Acute Diastolic I50.31  History:        Patient has prior history of Echocardiogram examinations, most recent 08/31/2021. CHF, COPD; Risk Factors:Sleep Apnea, Diabetes, Former Smoker and Dyslipidemia.  Sonographer:    Tillman Nora RVT RCS Referring Phys: [id number] MARSA NOVAK [facility]   Sonographer Comments: Technically difficult study due to poor echo windows and patient is obese. IMPRESSIONS   1. Left ventricular ejection fraction, by estimation, is 60 to 65%. The left ventricle has normal function. The left ventricle has no regional wall motion abnormalities. There is mild left ventricular hypertrophy. Left ventricular diastolic parameters are indeterminate. 2. Right ventricular systolic function is mildly reduced. The right ventricular size is moderately enlarged. There is mildly elevated pulmonary artery systolic pressure. The estimated right ventricular systolic pressure is 40.5 mmHg. 3. Left atrial size was moderately dilated. 4. Right atrial size was mildly dilated. 5. A small pericardial effusion is present. The pericardial effusion is anterior to the right ventricle. 6. The mitral valve is grossly normal. Trivial mitral valve regurgitation. No evidence of mitral stenosis. 7.  The aortic valve is tricuspid. Aortic valve regurgitation is not visualized. Aortic valve sclerosis/calcification is present, without any evidence of aortic stenosis. 8. The inferior vena cava is dilated in size with >50% respiratory variability, suggesting right atrial pressure of 8 mmHg.  FINDINGS Left Ventricle: Left ventricular ejection fraction, by estimation, is 60 to 65%. The left ventricle has normal function. The left ventricle has no regional wall motion abnormalities. Definity  contrast agent was given IV to delineate the left ventricular endocardial borders. The left ventricular internal cavity size was normal in  size. There is mild left ventricular hypertrophy. Left ventricular diastolic parameters are indeterminate.  Right Ventricle: The right ventricular size is moderately enlarged. No increase in right ventricular wall thickness. Right ventricular systolic function is mildly reduced. There is mildly elevated pulmonary artery systolic pressure. The tricuspid regurgitant velocity is 2.85 m/s, and with an assumed right atrial pressure of 8 mmHg, the estimated right ventricular systolic pressure is 40.5 mmHg.  Left Atrium: Left atrial size was moderately dilated.  Right Atrium: Right atrial size was mildly dilated.  Pericardium: A small pericardial effusion is present. The pericardial effusion is anterior to the right ventricle. Presence of epicardial fat layer.  Mitral Valve: The mitral valve is grossly normal. Trivial mitral valve regurgitation. No evidence of mitral valve stenosis.  Tricuspid Valve: The tricuspid valve is normal in structure. Tricuspid valve regurgitation is trivial. No evidence of tricuspid stenosis.  Aortic Valve: The aortic valve is tricuspid. Aortic valve regurgitation is not visualized. Aortic valve sclerosis/calcification is present, without any evidence of aortic stenosis. Aortic valve mean gradient measures 5.0 mmHg. Aortic valve peak gradient measures 9.4 mmHg. Aortic valve area, by VTI measures 3.61 cm.  Pulmonic Valve: The pulmonic valve was not well visualized. Pulmonic valve regurgitation is not visualized. No evidence of pulmonic stenosis.  Aorta: The aortic root is normal in size and structure. Ascending aorta measurements are within normal limits for age when indexed to body surface area.  Venous: The inferior vena cava is dilated in size with greater than 50% respiratory variability, suggesting right atrial pressure of 8 mmHg.  IAS/Shunts: No atrial level shunt detected by color flow Doppler.   LEFT VENTRICLE PLAX 2D LVIDd:         5.40 cm    Diastology LVIDs:         2.80 cm   LV e' medial: 9.73 cm/s LV PW:         1.30 cm LV IVS:        1.00 cm LVOT diam:     2.30 cm LV SV:         118 LV SV Index:   47 LVOT Area:     4.15 cm   RIGHT VENTRICLE             IVC RV Basal diam:  4.40 cm     IVC diam: 2.50 cm RV S prime:     15.30 cm/s TAPSE (M-mode): 2.5 cm  LEFT ATRIUM              Index        RIGHT ATRIUM           Index LA diam:        4.30 cm  1.72 cm/m   RA Area:     23.90 cm LA Vol (A2C):   118.0 ml 47.30 ml/m  RA Volume:   77.40 ml  31.03 ml/m LA Vol (A4C):   104.0 ml 41.69 ml/m  LA Biplane Vol: 116.0 ml 46.50 ml/m AORTIC VALVE                     PULMONIC VALVE AV Area (Vmax):    3.49 cm      PV Vmax:       0.81 m/s AV Area (Vmean):   3.40 cm      PV Peak grad:  2.7 mmHg AV Area (VTI):     3.61 cm AV Vmax:           153.50 cm/s AV Vmean:          103.000 cm/s AV VTI:            0.326 m AV Peak Grad:      9.4 mmHg AV Mean Grad:      5.0 mmHg LVOT Vmax:         129.00 cm/s LVOT Vmean:        84.200 cm/s LVOT VTI:          0.283 m LVOT/AV VTI ratio: 0.87  AORTA Ao Root diam: 3.10 cm Ao Asc diam:  3.80 cm  TRICUSPID VALVE TR Peak grad:   32.5 mmHg TR Vmax:        285.00 cm/s  SHUNTS Systemic VTI:  0.28 m Systemic Diam: 2.30 cm  Soyla Merck MD Electronically signed by Soyla Merck MD Signature Date/Time: 12/25/2022/10:47:22 AM    Final          ______________________________________________________________________________________________        Risk Assessment/Calculations:    CHA2DS2-VASc Score = 4   This indicates a 4.8% annual risk of stroke. The patient's score is based upon: CHF History: 1 HTN History: 1 Diabetes History: 1 Stroke History: 0 Vascular Disease History: 0 Age Score: 1 Gender Score: 0            Physical Exam:   VS:  BP (!) 123/52   Pulse 91   Ht 5' 10 (1.778 m)   Wt 295 lb (133.8 kg)   SpO2 96%   BMI 42.33 kg/m    Wt Readings from Last  3 Encounters:  12/01/23 295 lb (133.8 kg)  11/29/23 298 lb 1.6 oz (135.2 kg)  09/11/23 (!) 303 lb (137.4 kg)    GEN: Well nourished, overweight, well developed in no acute distress NECK: No JVD; No carotid bruits CARDIAC: IRIR, no murmurs, rubs, gallops RESPIRATORY:  Clear to auscultation without rales, wheezing or rhonchi  ABDOMEN: Soft, non-tender, non-distended EXTREMITIES:  No edema; No deformity   ASSESSMENT AND PLAN: .    Assessment & Plan HFpEF /hypertension Grossly euvolemic on exam. Reports weights at home have been stable.  -Continue Entresto  24-26mg  BID, refill provided as well as 30 day coupon. -Continue Jardiance  25mg  daily . -Continue Torsemide  20mg  daily with additional 20mg  PRN for weight gain of 5 lbs in one week. Has not required extra dosing recently. -Avoid MRA due to renal dysfunction. -Low sodium diet, fluid restriction <2L, and daily weights encouraged. Educated to contact our office for weight gain of 2 lbs overnight or 5 lbs in one week.   Alcohol  use Cessation encouraged.   HTN BP controlled at this time. Continue present antihypertensive regimen Entresto  24-26mg  BID, Torsemide  20mg  daily. No longer requiring Hydralazine . Discussed to monitor BP at home at least 2 hours after medications and sitting for 5-10 minutes.   Persistent atrial fibrillation/hypercoagulable state/Bradycardia Rate controlled by auscultation today. CHA2DS2-VASc Score = 4 [CHF History: 1, HTN History: 1,  Diabetes History: 1, Stroke History: 0, Vascular Disease History: 0, Age Score: 1, Gender Score: 0].  Therefore, the patient's annual risk of stroke is 4.8 %.     - Continue Eliquis  5 mg twice daily, does not meet dose reduction criteria. -recent monitor with bradycardia but no significant pauses, remain off Carvedilol   Chronic Kidney Disease Careful titration of diuretic and antihypertensive.  Previously declined referral to nephrology per primary care team notes.  Fatigue Ongoing  since 08/2023. Prior thyroid  function unremarkable. Update CBC today to rule out anemia. If unremarkable, recommend further follow up with PCP regarding fatigue.   DM2 Continue to follow with PCP.   OSA Reports wearing CPAP regularly           Dispo: follow up in 3 months  Signed, Reche GORMAN Finder, NP

## 2023-12-01 NOTE — Patient Instructions (Signed)
 Medication Instructions:   Your physician recommends that you continue on your current medications as directed. Please refer to the Current Medication list given to you today.   *If you need a refill on your cardiac medications before your next appointment, please call your pharmacy*  Lab Work:  Your physician recommends that you return for lab work at your convenience.  BMET/CBC.     If you have labs (blood work) drawn today and your tests are completely normal, you will receive your results only by: MyChart Message (if you have MyChart) OR A paper copy in the mail If you have any lab test that is abnormal or we need to change your treatment, we will call you to review the results.  Testing/Procedures:  None ordered.   Follow-Up: At [facility], you and your health needs are our priority.  As part of our continuing mission to provide you with exceptional heart care, our providers are all part of one team.  This team includes your primary Cardiologist (physician) and Advanced Practice Providers or APPs (Physician Assistants and Nurse Practitioners) who all work together to provide you with the care you need, when you need it.  Your next appointment:   3 month(s)  Provider:   Reche Finder, NP    We recommend signing up for the patient portal called MyChart.  Sign up information is provided on this After Visit Summary.  MyChart is used to connect with patients for Virtual Visits (Telemedicine).  Patients are able to view lab/test results, encounter notes, upcoming appointments, etc.  Non-urgent messages can be sent to your provider as well.   To learn more about what you can do with MyChart, go to [email or web address].   Other Instructions  Call us  if you have any issues picking up Entresto .   Your monitor showed continuous atrial fibrillation which is stable from previous. There were no significant pauses which is a good result! Remain off Carvedilol .

## 2023-12-04 LAB — CBC
Hematocrit: 44.7 % (ref 37.5–51.0)
Hemoglobin: 13.7 g/dL (ref 13.0–17.7)
MCH: 28.6 pg (ref 26.6–33.0)
MCHC: 30.6 g/dL — ABNORMAL LOW (ref 31.5–35.7)
MCV: 93 fL (ref 79–97)
Platelets: 222 x10E3/uL (ref 150–450)
RBC: 4.79 x10E6/uL (ref 4.14–5.80)
RDW: 14.5 % (ref 11.6–15.4)
WBC: 6.3 x10E3/uL (ref 3.4–10.8)

## 2023-12-04 LAB — BASIC METABOLIC PANEL WITH GFR
BUN/Creatinine Ratio: 12 (ref 10–24)
BUN: 13 mg/dL (ref 8–27)
CO2: 22 mmol/L (ref 20–29)
Calcium: 10.9 mg/dL — ABNORMAL HIGH (ref 8.6–10.2)
Chloride: 101 mmol/L (ref 96–106)
Creatinine, Ser: 1.13 mg/dL (ref 0.76–1.27)
Glucose: 120 mg/dL — ABNORMAL HIGH (ref 70–99)
Potassium: 4.6 mmol/L (ref 3.5–5.2)
Sodium: 143 mmol/L (ref 134–144)
eGFR: 68 mL/min/1.73 (ref >59)

## 2023-12-05 ENCOUNTER — Ambulatory Visit (HOSPITAL_BASED_OUTPATIENT_CLINIC_OR_DEPARTMENT_OTHER): Payer: Self-pay | Admitting: Family

## 2023-12-14 ENCOUNTER — Telehealth (HOSPITAL_BASED_OUTPATIENT_CLINIC_OR_DEPARTMENT_OTHER): Payer: Self-pay | Admitting: Family

## 2023-12-14 ENCOUNTER — Other Ambulatory Visit (HOSPITAL_COMMUNITY): Payer: Self-pay

## 2023-12-14 ENCOUNTER — Telehealth: Payer: Self-pay | Admitting: Family

## 2023-12-14 ENCOUNTER — Telehealth: Payer: Self-pay

## 2023-12-14 NOTE — Telephone Encounter (Signed)
 Pt c/o medication issue:  1. Name of Medication: Entresto   2. How are you currently taking this medication (dosage and times per day)?   3. Are you having a reaction (difficulty breathing--STAT)?   4. What is your medication issue? Patient said he was given a coupon for Entresto  , coupon did not work. He said he had to pay $235.62

## 2023-12-14 NOTE — Telephone Encounter (Signed)
 Pt calling to advise that we are supposed to be getting paperwork from the [facility] concerning his Entresto . Advised patient that I do not see that we are received anything from them. Advised that we will be on the look out for the paperwork in. Pt reports that there is nothing further in this time that we can. Pending [facility] paperwork.

## 2023-12-14 NOTE — Telephone Encounter (Signed)
 Has this been something you have bene working on?

## 2023-12-14 NOTE — Telephone Encounter (Addendum)
 Pharmacy Patient Advocate Encounter  Insurance verification completed.   The patient is insured through [employer]   Ran test claim for ENTRESTO . Currently a quantity of 60 is a 30 day supply and the co-pay is NA . REFILL TOO SOON. LAST FILLED ON 12/14/23. NEXT FILL DUE ON OR AFTER 01/06/24.  This test claim was processed through [facility]- copay amounts may vary at other pharmacies due to pharmacy/plan contracts, or as the patient moves through the different stages of their insurance plan.

## 2023-12-14 NOTE — Telephone Encounter (Addendum)
 Our team has not provided the patient with any card info for Entresto . He has Medicare so he would be ineligible to use a copay card. Please see separate encounter from today 12/14/23 regarding paperwork from the [facility].

## 2023-12-14 NOTE — Telephone Encounter (Signed)
 Pt c/o medication issue:  1. Name of Medication: Entresto   2. How are you currently taking this medication (dosage and times per day)?   3. Are you having a reaction (difficulty breathing--STAT)?   4. What is your medication issue? Patient is calling to see if [facility] have sent papers, approving his Entresto - please let the patient know asap

## 2023-12-14 NOTE — Telephone Encounter (Signed)
 No, not sure what patient is referring to. But per test claim the Entresto  has been filled today.

## 2023-12-18 ENCOUNTER — Telehealth (HOSPITAL_BASED_OUTPATIENT_CLINIC_OR_DEPARTMENT_OTHER): Payer: Self-pay | Admitting: Cardiology

## 2023-12-18 ENCOUNTER — Other Ambulatory Visit (HOSPITAL_COMMUNITY): Payer: Self-pay

## 2023-12-18 ENCOUNTER — Telehealth: Payer: Self-pay | Admitting: Pharmacy Technician

## 2023-12-18 ENCOUNTER — Other Ambulatory Visit (HOSPITAL_BASED_OUTPATIENT_CLINIC_OR_DEPARTMENT_OTHER): Payer: Self-pay | Admitting: *Deleted

## 2023-12-18 MED ORDER — SACUBITRIL-VALSARTAN 24-26 MG PO TABS: 1.0000 | ORAL_TABLET | Freq: Two times a day (BID) | ORAL | 11 refills | Status: AC

## 2023-12-18 NOTE — Telephone Encounter (Signed)
   His prescription went to [facility]. No note of the [facility] receiving the prescription. I called [facility] and it was a cost issue. He did pay for it so I am getting him a grant. [facility] said they wouldn't be able to reverse and refund the rx he just got but it will be on file for next fill. I think the paperwork from the [facility] was like a tier exception in hopes to bring the cost down. Patients copay may be cheaper next time anyways.

## 2023-12-18 NOTE — Telephone Encounter (Signed)
 Pt c/o medication issue:  1. Name of Medication:   sacubitril -valsartan  (ENTRESTO ) 24-26 MG   2. How are you currently taking this medication (dosage and times per day)?   As prescribed  3. Are you having a reaction (difficulty breathing--STAT)?   4. What is your medication issue?   Patient is following-up on the status of his medication assistance to get this medication and   stated [facility] should have sent paperwork.  Patient noted he still has medication.

## 2023-12-18 NOTE — Telephone Encounter (Signed)
 Hi, the patient called me and said yes this is a cost thing but if he gets it at the [facility] it will be free. He is asking if the prescription for entresto  can be [facility]. So they can start filling it. Thank you!

## 2023-12-25 ENCOUNTER — Ambulatory Visit: Payer: Self-pay | Admitting: Student

## 2023-12-25 ENCOUNTER — Encounter (HOSPITAL_BASED_OUTPATIENT_CLINIC_OR_DEPARTMENT_OTHER): Payer: Self-pay | Admitting: *Deleted

## 2023-12-25 DIAGNOSIS — R001 Bradycardia, unspecified: Secondary | ICD-10-CM | POA: Diagnosis not present

## 2024-01-10 ENCOUNTER — Ambulatory Visit: Payer: Self-pay

## 2024-01-10 NOTE — Telephone Encounter (Signed)
**Note Francis-Identified via Obfuscation**  FYI Only or Action Required?: FYI only for provider.  Patient was last seen in primary care on 11/29/2023 by Francis Cardenas, Francis PARAS, MD.  Called Nurse Triage reporting Neurologic Problem.  Symptoms began several years ago.  Interventions attempted: Nothing.  Symptoms are: gradually worsening.  Triage Disposition: See PCP When Office is Open (Within 3 Days)  Patient/caregiver understands and will follow disposition?: Yes      Copied from CRM #[id number]. Topic: Clinical - Red Word Triage >> Jan 10, 2024 10:44 AM Montie POUR wrote: Red Word that prompted transfer to Nurse Triage:  Norleen states he has severe neuropathy in his feet and hand. His pain is a 10 in both places. He is looking for an appointment to get a referral to a neurologist. Reason for Disposition  [1] Numbness or tingling in one or both hands AND [2] is a chronic symptom (recurrent or ongoing AND present > 4 weeks)  Answer Assessment - Initial Assessment Questions 1. SYMPTOM: What is the main symptom you are concerned about? (e.g., weakness, numbness)     Numbness and pain in both hands and feet, hx neuropathy 2. ONSET: When did this start? (e.g., minutes, hours, days; while sleeping)     Years ago 3. LAST NORMAL: When was the last time you (the patient) were normal (no symptoms)?     5 years 4. PATTERN Does this come and go, or has it been constant since it started?  Is it present now?     constant 5. CARDIAC SYMPTOMS: Have you had any of the following symptoms: chest pain, difficulty breathing, palpitations?     Chf, afib 6. NEUROLOGIC SYMPTOMS: Have you had any of the following symptoms: headache, dizziness, vision loss, double vision, changes in speech, unsteady on your feet?     Unsteady on feet 7. OTHER SYMPTOMS: Do you have any other symptoms?     no 8. PREGNANCY: Is there any chance you are pregnant? When was your last menstrual period?     na  Protocols used: Neurologic Deficit-A-AH

## 2024-01-11 ENCOUNTER — Ambulatory Visit (HOSPITAL_BASED_OUTPATIENT_CLINIC_OR_DEPARTMENT_OTHER): Admitting: Family Medicine

## 2024-02-22 ENCOUNTER — Telehealth (HOSPITAL_BASED_OUTPATIENT_CLINIC_OR_DEPARTMENT_OTHER): Payer: Self-pay | Admitting: *Deleted

## 2024-02-22 ENCOUNTER — Encounter (HOSPITAL_BASED_OUTPATIENT_CLINIC_OR_DEPARTMENT_OTHER): Payer: Self-pay | Admitting: *Deleted

## 2024-02-22 NOTE — Telephone Encounter (Signed)
 LVM for patient to call the office to schedule medicare wellness visit

## 2024-03-04 ENCOUNTER — Other Ambulatory Visit (HOSPITAL_BASED_OUTPATIENT_CLINIC_OR_DEPARTMENT_OTHER): Payer: Self-pay

## 2024-03-04 ENCOUNTER — Ambulatory Visit (INDEPENDENT_AMBULATORY_CARE_PROVIDER_SITE_OTHER): Admitting: Family Medicine

## 2024-03-04 ENCOUNTER — Encounter (HOSPITAL_BASED_OUTPATIENT_CLINIC_OR_DEPARTMENT_OTHER): Payer: Self-pay | Admitting: Family Medicine

## 2024-03-04 VITALS — BP 138/77 | HR 67 | Ht 70.0 in | Wt 296.3 lb

## 2024-03-04 DIAGNOSIS — E1142 Type 2 diabetes mellitus with diabetic polyneuropathy: Secondary | ICD-10-CM | POA: Diagnosis not present

## 2024-03-04 DIAGNOSIS — I10 Essential (primary) hypertension: Secondary | ICD-10-CM | POA: Diagnosis not present

## 2024-03-04 LAB — POCT GLYCOSYLATED HEMOGLOBIN (HGB A1C)
HbA1c POC (<> result, manual entry): 5.9 % (ref 4.0–5.6)
HbA1c, POC (controlled diabetic range): 5.9 % (ref 0.0–7.0)
HbA1c, POC (prediabetic range): 5.9 % (ref 5.7–6.4)
Hemoglobin A1C: 5.9 % — AB (ref 4.0–5.6)

## 2024-03-04 MED ORDER — COMIRNATY 30 MCG/0.3ML IM SUSY
0.3000 mL | PREFILLED_SYRINGE | Freq: Once | INTRAMUSCULAR | 0 refills | Status: AC
  Filled 2024-03-04: qty 0.3, 1d supply, fill #0

## 2024-03-04 NOTE — Patient Instructions (Signed)
   Medication Instructions:  Your physician recommends that you continue on your current medications as directed. Please refer to the Current Medication list given to you today. --If you need a refill on any your medications before your next appointment, please call your pharmacy first. If no refills are authorized on file call the office.--  Follow-Up: Your next appointment:   Your physician recommends that you schedule a follow-up appointment in: 3-4 month follow up  with Dr. de Peru  You will receive a text message or e-mail with a link to a survey about your care and experience with [place] today! We would greatly appreciate your feedback!   Thanks for letting us be apart of your health journey!!  Primary Care and Sports Medicine   Dr. Ceasar Mons Peru   We encourage you to activate your patient portal called "MyChart".  Sign up information is provided on this After Visit Summary.  MyChart is used to connect with patients for Virtual Visits (Telemedicine).  Patients are able to view lab/test results, encounter notes, upcoming appointments, etc.  Non-urgent messages can be sent to your provider as well. To learn more about what you can do with MyChart, please visit --  [email or web address].

## 2024-03-04 NOTE — Progress Notes (Signed)
    Procedures performed today:    None.  Independent interpretation of notes and tests performed by another provider:   None.  Brief History, Exam, Impression, and Recommendations:    BP 138/77 (BP Location: Right Arm, Patient Position: Sitting, Cuff Size: Large)   Pulse 67   Ht 5' 10 (1.778 m)   Wt 296 lb 4.8 oz (134.4 kg)   SpO2 96%   BMI 42.51 kg/m   Benign essential hypertension Assessment & Plan: Blood pressure appropriate in office today.  He continues with medications as prescribed, has not had any recent falls, feels that changes made at most recent hospitalization have been very beneficial in regards to blood pressure control. Management is complicated by chronic medical conditions as well as alcohol  abuse.  Can continue with current medication regimen, no changes made today.  Recommend intermittent monitoring of blood pressure at home, DASH diet   Type 2 diabetes mellitus with diabetic polyneuropathy, without long-term current use of insulin  (HCC) Assessment & Plan: Recent hemoglobin A1c has been well-controlled, due for recheck today.  He is currently taking Jardiance  which assist with blood sugar control as well as underlying heart failure. Patient does have notable cardiac history as well as underlying sleep apnea.  Given this, patient likely would benefit from GLP-1 receptor agonist, likely options in this regard would be Ozempic or Mounjaro.  Can discuss further at future office visit Foot exam completed in office today.  A1c testing in office today continues to show good control at 5.9%.  No changes to medications today.  Orders: -     POCT glycosylated hemoglobin (Hb A1C)  Return in about 4 months (around 07/05/2024) for diabetes, hypertension.   ___________________________________________ Francis Vandenbos de [place], MD, ABFM, CAQSM Primary Care and Sports Medicine [facility]

## 2024-03-04 NOTE — Assessment & Plan Note (Signed)
 Blood pressure appropriate in office today.  He continues with medications as prescribed, has not had any recent falls, feels that changes made at most recent hospitalization have been very beneficial in regards to blood pressure control. Management is complicated by chronic medical conditions as well as alcohol  abuse.  Can continue with current medication regimen, no changes made today.  Recommend intermittent monitoring of blood pressure at home, DASH diet

## 2024-03-04 NOTE — Assessment & Plan Note (Signed)
 Recent hemoglobin A1c has been well-controlled, due for recheck today.  He is currently taking Jardiance  which assist with blood sugar control as well as underlying heart failure. Patient does have notable cardiac history as well as underlying sleep apnea.  Given this, patient likely would benefit from GLP-1 receptor agonist, likely options in this regard would be Ozempic or Mounjaro.  Can discuss further at future office visit Foot exam completed in office today.  A1c testing in office today continues to show good control at 5.9%.  No changes to medications today.

## 2024-03-11 ENCOUNTER — Ambulatory Visit (INDEPENDENT_AMBULATORY_CARE_PROVIDER_SITE_OTHER): Admitting: Family

## 2024-03-11 ENCOUNTER — Encounter (HOSPITAL_BASED_OUTPATIENT_CLINIC_OR_DEPARTMENT_OTHER): Payer: Self-pay | Admitting: Family

## 2024-03-11 VITALS — BP 140/64 | HR 66 | Ht 70.0 in | Wt 301.6 lb

## 2024-03-11 DIAGNOSIS — D6859 Other primary thrombophilia: Secondary | ICD-10-CM

## 2024-03-11 DIAGNOSIS — I5032 Chronic diastolic (congestive) heart failure: Secondary | ICD-10-CM | POA: Diagnosis not present

## 2024-03-11 DIAGNOSIS — I10 Essential (primary) hypertension: Secondary | ICD-10-CM | POA: Diagnosis not present

## 2024-03-11 DIAGNOSIS — I4819 Other persistent atrial fibrillation: Secondary | ICD-10-CM | POA: Diagnosis not present

## 2024-03-11 NOTE — Patient Instructions (Addendum)
 Medication Instructions:   Your physician recommends that you continue on your current medications as directed. Please refer to the Current Medication list given to you today.     Follow-Up: 6 months Reche Finder, NP   Special Instructions:   Recommend weighing daily and keeping a log. If your weight goes up 2 lbs overnight or 5 lbs in one week, recommend taking an extra tablet of Torsemide .    If your weight tomorrow is more than 295, please take two Torsemide  instead of one to help get rid of extra fluid.

## 2024-03-11 NOTE — Progress Notes (Signed)
 Cardiology Office Note:  .   Date:  03/11/2024  ID:  Francis Cardenas, DOB 13-Sep-1950, MRN [id number] PCP: de [place], Raymond J, MD  [facility] Providers Cardiologist:  Shelda Bruckner, MD    History of Present Illness: .   Francis Cardenas is a 73 y.o. male with hx of HFpEF, CKDIIIb, BPH, permanent atrial fibrillation, morbid obesity, OSA on CPAP, DM2, diabetic polyneuropathy, GERD, HTN, etoh use  Echo 12/25/22 LVEF 60 to 65%, no RWMA, mild LVH, indeterminate diastolic parameters in the setting of atrial fibrillation, RV SF mildly reduced, RV moderately enlarged, mildly elevated PASP, small pericardial effusion, trivial MR.  Admitted 4/5-4/10/25 for  acute on chronic diastolic heart failure. Diuresed 10.6L with weight down 17 lbs. noted that he continued drinking 8-9 beers per day starting in the morning.  Discharged on torsemide  20 mg twice daily, hydralazine  25 mg daily, Jardiance  25 mg daily, carvedilol  6.25 mg twice daily.  Creatinine prior to discharge improved to 1.46. Discharge weight 302 lbs.   Last seen 09/11/23 doing overall well post hospitalization with improvement in LE edema. No changes made at that time. He reported ongoing fatigue and was encouraged to gradually increase physical activity.  Admitted 6/4-10/27/23 with recurrent falls noted to be heavily drinking though this was not new from previous and very slow A-fib.  Carvedilol  held due to bradycardia and heart rate in the 30s during sleep.  Due to hyponatremia, spironolactone  was discontinued.  He was recommended to remain off lisinopril , amlodipine , hydralazine .  Jardiance  25 mg daily continued.  Monitor worn as 2 parts. Initial monitor with 1 9 beat run of VT at 182 bpm which was asymptomatic.  100% atrial fibrillation burden average 66bpm with no significant pauses.  3.1% PVC burden.  Second monitor with 100% atrial fibrillation burden 45-115 bpm with average 71 bpm with no significant pauses.  3.4% PVC  burden.  Discussed the use of AI scribe software for clinical note transcription with the patient, who gave verbal consent to proceed.  History of Present Illness At last visit 11/2023.  noted persistent fatigue and nocturia. He was taking Torsemide  daily with minimal improvement in LE edema. Labs with improving renal function, improvement in anemia. No  abnormalities to cause fatigue - PCP follow up advised.   Presents today for follow up. Weight up 6 lbs from clinic visit 3 months ago.    Francis Cardenas is a 73 year old male with atrial fibrillation and hypertension who presents for cardiovascular follow-up.  He experiences slight worsening dyspnea despite regular use of medications, including Stelvato and Wixela. He uses a CPAP machine at night. Recent weight increase to over 300 pounds from his usual 295-296 pounds is noted, with abdominal bloating. He takes torsemide20mg  daily, no extra doses recently. Blood pressure is usually around 130/60s but was elevated to 150 today. He has not been checking his blood pressure at home recently. Notes dietary indiscretion with ramen noodles, hot dogs.    ROS: Please see the history of present illness.    All other systems reviewed and are negative.   Studies Reviewed: .        Cardiac Studies & Procedures   ______________________________________________________________________________________________ CARDIAC CATHETERIZATION  CARDIAC CATHETERIZATION 04/09/2019  Conclusion  LV end diastolic pressure is moderately elevated.  The left ventricular systolic function is normal.  The left ventricular ejection fraction is 55-65% by visual estimate.  There is trivial (1+) mitral regurgitation.  There is no aortic valve stenosis.  Hemodynamic findings  consistent with moderate pulmonary hypertension.  1. Normal coronary anatomy 2. Normal LV systolic function 3. Moderately elevated LV filling pressures. Large V waves to 58 mm Hg c/w LV  restriction. No significant MR noted. 4. No AV gradient. 5. Moderate pulmonary HTN 6. Preserved cardiac output. Index 3.  Findings Coronary Findings Diagnostic  Dominance: Right  Left Main Vessel was injected. Vessel is normal in caliber. Vessel is angiographically normal.  Left Anterior Descending Vessel was injected. Vessel is normal in caliber. Vessel is angiographically normal.  Left Circumflex Vessel was injected. Vessel is normal in caliber. Vessel is angiographically normal.  Right Coronary Artery Vessel was injected. Vessel is normal in caliber. Vessel is angiographically normal.  Intervention  No interventions have been documented.   STRESS TESTS  NM MYOCAR MULTI W/SPECT W 04/08/2019  Narrative  There was no ST segment deviation noted during stress.  The left ventricular ejection fraction is mildly decreased (45-54%).  This is an intermediate risk study.  There is a fixed perfusion defect of moderate severity present in the inferior/inferolateral segments base to apex.  This defect is moderate in size and comprises roughly 15-20% of the LV myocardium.  The wall motion in this region remains normal, and there is significant gut uptake artifact present on the study.  Overall, this likely reflects artifact due to increased radiotracer in the gut, but unable to exclude ischemia in this region.  There is no evidence of reversible ischemia in the other segments.  Impression:  1.  Equivocal myocardial perfusion imaging study with significant gut uptake artifact.  Indeterminate for ischemia in the inferior/inferolateral segments as described above. 2.  Mildly dilated left ventricular cavity with mildly reduced ejection fraction, 45%.  [name], MD [facility] [address] [place], [place] [address] [phone] 4:35 PM   ECHOCARDIOGRAM  ECHOCARDIOGRAM COMPLETE 12/25/2022  Narrative ECHOCARDIOGRAM REPORT    Patient Name:    Francis Cardenas Date of Exam: 12/25/2022 Medical Rec #:  [id number]      Height:       70.0 in Accession #:    [id number]     Weight:       304.2 lb Date of Birth:  Jan 06, 1951     BSA:          2.495 m Patient Age:    71 years       BP:           106/44 mmHg Patient Gender: M              HR:           60 bpm. Exam Location:  Inpatient  Procedure: 2D Echo, Cardiac Doppler, Color Doppler and Intracardiac Opacification Agent  Indications:    CHF-Acute Diastolic I50.31  History:        Patient has prior history of Echocardiogram examinations, most recent 08/31/2021. CHF, COPD; Risk Factors:Sleep Apnea, Diabetes, Former Smoker and Dyslipidemia.  Sonographer:    Tillman Nora RVT RCS Referring Phys: [id number] MARSA NOVAK [facility]   Sonographer Comments: Technically difficult study due to poor echo windows and patient is obese. IMPRESSIONS   1. Left ventricular ejection fraction, by estimation, is 60 to 65%. The left ventricle has normal function. The left ventricle has no regional wall motion abnormalities. There is mild left ventricular hypertrophy. Left ventricular diastolic parameters are indeterminate. 2. Right ventricular systolic function is mildly reduced. The right ventricular size is moderately enlarged. There is mildly elevated pulmonary  artery systolic pressure. The estimated right ventricular systolic pressure is 40.5 mmHg. 3. Left atrial size was moderately dilated. 4. Right atrial size was mildly dilated. 5. A small pericardial effusion is present. The pericardial effusion is anterior to the right ventricle. 6. The mitral valve is grossly normal. Trivial mitral valve regurgitation. No evidence of mitral stenosis. 7. The aortic valve is tricuspid. Aortic valve regurgitation is not visualized. Aortic valve sclerosis/calcification is present, without any evidence of aortic stenosis. 8. The inferior vena cava is dilated in size with >50% respiratory variability, suggesting right atrial  pressure of 8 mmHg.  FINDINGS Left Ventricle: Left ventricular ejection fraction, by estimation, is 60 to 65%. The left ventricle has normal function. The left ventricle has no regional wall motion abnormalities. Definity  contrast agent was given IV to delineate the left ventricular endocardial borders. The left ventricular internal cavity size was normal in size. There is mild left ventricular hypertrophy. Left ventricular diastolic parameters are indeterminate.  Right Ventricle: The right ventricular size is moderately enlarged. No increase in right ventricular wall thickness. Right ventricular systolic function is mildly reduced. There is mildly elevated pulmonary artery systolic pressure. The tricuspid regurgitant velocity is 2.85 m/s, and with an assumed right atrial pressure of 8 mmHg, the estimated right ventricular systolic pressure is 40.5 mmHg.  Left Atrium: Left atrial size was moderately dilated.  Right Atrium: Right atrial size was mildly dilated.  Pericardium: A small pericardial effusion is present. The pericardial effusion is anterior to the right ventricle. Presence of epicardial fat layer.  Mitral Valve: The mitral valve is grossly normal. Trivial mitral valve regurgitation. No evidence of mitral valve stenosis.  Tricuspid Valve: The tricuspid valve is normal in structure. Tricuspid valve regurgitation is trivial. No evidence of tricuspid stenosis.  Aortic Valve: The aortic valve is tricuspid. Aortic valve regurgitation is not visualized. Aortic valve sclerosis/calcification is present, without any evidence of aortic stenosis. Aortic valve mean gradient measures 5.0 mmHg. Aortic valve peak gradient measures 9.4 mmHg. Aortic valve area, by VTI measures 3.61 cm.  Pulmonic Valve: The pulmonic valve was not well visualized. Pulmonic valve regurgitation is not visualized. No evidence of pulmonic stenosis.  Aorta: The aortic root is normal in size and structure. Ascending aorta  measurements are within normal limits for age when indexed to body surface area.  Venous: The inferior vena cava is dilated in size with greater than 50% respiratory variability, suggesting right atrial pressure of 8 mmHg.  IAS/Shunts: No atrial level shunt detected by color flow Doppler.   LEFT VENTRICLE PLAX 2D LVIDd:         5.40 cm   Diastology LVIDs:         2.80 cm   LV e' medial: 9.73 cm/s LV PW:         1.30 cm LV IVS:        1.00 cm LVOT diam:     2.30 cm LV SV:         118 LV SV Index:   47 LVOT Area:     4.15 cm   RIGHT VENTRICLE             IVC RV Basal diam:  4.40 cm     IVC diam: 2.50 cm RV S prime:     15.30 cm/s TAPSE (M-mode): 2.5 cm  LEFT ATRIUM              Index        RIGHT ATRIUM  Index LA diam:        4.30 cm  1.72 cm/m   RA Area:     23.90 cm LA Vol (A2C):   118.0 ml 47.30 ml/m  RA Volume:   77.40 ml  31.03 ml/m LA Vol (A4C):   104.0 ml 41.69 ml/m LA Biplane Vol: 116.0 ml 46.50 ml/m AORTIC VALVE                     PULMONIC VALVE AV Area (Vmax):    3.49 cm      PV Vmax:       0.81 m/s AV Area (Vmean):   3.40 cm      PV Peak grad:  2.7 mmHg AV Area (VTI):     3.61 cm AV Vmax:           153.50 cm/s AV Vmean:          103.000 cm/s AV VTI:            0.326 m AV Peak Grad:      9.4 mmHg AV Mean Grad:      5.0 mmHg LVOT Vmax:         129.00 cm/s LVOT Vmean:        84.200 cm/s LVOT VTI:          0.283 m LVOT/AV VTI ratio: 0.87  AORTA Ao Root diam: 3.10 cm Ao Asc diam:  3.80 cm  TRICUSPID VALVE TR Peak grad:   32.5 mmHg TR Vmax:        285.00 cm/s  SHUNTS Systemic VTI:  0.28 m Systemic Diam: 2.30 cm  Soyla Merck MD Electronically signed by Soyla Merck MD Signature Date/Time: 12/25/2022/10:47:22 AM    Final    MONITORS  LONG TERM MONITOR-LIVE TELEMETRY (3-14 DAYS) 11/27/2023  Narrative Patch Wear Time:  14 days and 8 hours (2025-06-06T13:17:00-0400 to 2025-06-21T06:20:40-0400)  Monitor 1 1 run of Ventricular  Tachycardia occurred lasting 9 beats with a max rate of 182 bpm (avg 170 bpm). Atrial Fibrillation occurred continuously (100% burden), ranging from 39-110 bpm (avg of 66 bpm). Idioventricular Rhythm was present. Isolated VEs were occasional (3.1%), VE Couplets were rare (<1.0%).  Monitor 2 Atrial Fibrillation occurred continuously (100% burden), ranging from 45-115 bpm (avg of 71 bpm). Isolated VEs were occasional (3.4%).       ______________________________________________________________________________________________           Risk Assessment/Calculations:    CHA2DS2-VASc Score = 4   This indicates a 4.8% annual risk of stroke. The patient's score is based upon: CHF History: 1 HTN History: 1 Diabetes History: 1 Stroke History: 0 Vascular Disease History: 0 Age Score: 1 Gender Score: 0    HYPERTENSION CONTROL Vitals:   03/11/24 0807 03/11/24 0826  BP: (!) 150/62 (!) 140/64    The patient's blood pressure is elevated above target today.  In order to address the patient's elevated BP: Blood pressure will be monitored at home to determine if medication changes need to be made.          Physical Exam:   VS:  BP (!) 140/64   Pulse 66   Ht 5' 10 (1.778 m)   Wt (!) 301 lb 9.6 oz (136.8 kg)   SpO2 99%   BMI 43.28 kg/m    Wt Readings from Last 3 Encounters:  03/11/24 (!) 301 lb 9.6 oz (136.8 kg)  03/04/24 296 lb 4.8 oz (134.4 kg)  12/01/23 295 lb (133.8 kg)  Vitals:   03/11/24 0807 03/11/24 0826  BP: (!) 150/62 (!) 140/64  Pulse: 66   Height: 5' 10 (1.778 m)   Weight: (!) 301 lb 9.6 oz (136.8 kg)   SpO2: 99%   BMI (Calculated): 43.28     GEN: Well nourished, overweight, well developed in no acute distress NECK: No JVD; No carotid bruits CARDIAC: IRIR, no murmurs, rubs, gallops RESPIRATORY:  Clear to auscultation without rales, wheezing or rhonchi  ABDOMEN: Soft, non-tender, non-distended EXTREMITIES:  No edema; No deformity   ASSESSMENT AND PLAN: .     Assessment & Plan HFpEF /hypertension Grossly euvolemic on exam. Reports weights at home have been gradually increasing. -Continue Entresto  24-26mg  BID (has [facility] for cost) -Continue Jardiance  25mg  daily . -Continue Torsemide  20mg  daily with additional 20mg  PRN for weight gain of 5 lbs in one week. Has not required extra dosing recently. -Avoid MRA due to renal dysfunction. -Low sodium diet, fluid restriction <2L, and daily weights encouraged. Educated to contact our office for weight gain of 2 lbs overnight or 5 lbs in one week.  -if weight >195 lbs tomorrow he will take Torsemide  40mg  dose  Alcohol  use Cessation encouraged.   HTN BP elevated, high sodium meal yesterday. Improved from SBP 150 to 140 in clinic without intervention. Continue present antihypertensive regimen Entresto  24-26mg  BID, Torsemide  20mg  daily. No longer requiring Hydralazine . Discussed to monitor BP at home at least 2 hours after medications and sitting for 5-10 minutes. If BP persistently >130/80, consider increasing dose of Entresto .   Persistent atrial fibrillation/hypercoagulable state/Bradycardia Rate controlled by auscultation today. CHA2DS2-VASc Score = 4 [CHF History: 1, HTN History: 1, Diabetes History: 1, Stroke History: 0, Vascular Disease History: 0, Age Score: 1, Gender Score: 0].  Therefore, the patient's annual risk of stroke is 4.8 %.     - Continue Eliquis  5 mg twice daily, does not meet dose reduction criteria. -Monitor 11/2023 with bradycardia but no significant pauses, remain off Carvedilol   Chronic Kidney Disease Careful titration of diuretic and antihypertensive.  Previously declined referral to nephrology per primary care team notes.  DM2 Continue to follow with PCP.   OSA Reports wearing CPAP regularly     Dispo: follow up in 6 months  Signed, Reche GORMAN Finder, NP

## 2024-04-24 ENCOUNTER — Telehealth (HOSPITAL_BASED_OUTPATIENT_CLINIC_OR_DEPARTMENT_OTHER): Payer: Self-pay | Admitting: Family Medicine

## 2024-04-24 NOTE — Telephone Encounter (Signed)
 Copied from CRM [id number]. Topic: Clinical - Medication Refill >> Apr 24, 2024  8:46 AM Tobias L wrote: Medication: ALPRAZolam  (XANAX ) 1 MG tablet allopurinol  (ZYLOPRIM ) 100MG  tablet - [facility] for a total of 400mg .   Patient requesting for Dr. De Cuba to takeover prescriptions going forward. Recently received courtesy refill from previous PCP, however patient has changed his PCP to Dr. De Cuba and would like for him to take over prescriptions.   Has the patient contacted their pharmacy? Yes They sent request to old PCP, patient contacting office so Dr. De Cuba can takeover prescriptions.  This is the patient's preferred pharmacy:  [id number] - [place], [place] - [address] [place] [address] Phone: [phone] Fax: [phone]  Is this the correct pharmacy for this prescription? Yes  Has the prescription been filled recently? Yes, alprazolam  recently filled on 04/19/24. Allopurinol  100mg  filled on 04/19/2024 Is the patient out of the medication? No  Has the patient been seen for an appointment in the last year OR does the patient have an upcoming appointment? Yes  Can we respond through MyChart? No  Agent: Please be advised that Rx refills may take up to 3 business days. We ask that you follow-up with your pharmacy.

## 2024-04-28 MED ORDER — ALLOPURINOL 100 MG PO TABS: 100.0000 mg | ORAL_TABLET | Freq: Every day | ORAL | 1 refills | Status: AC

## 2024-05-19 ENCOUNTER — Encounter (HOSPITAL_BASED_OUTPATIENT_CLINIC_OR_DEPARTMENT_OTHER): Payer: Self-pay | Admitting: Family Medicine

## 2024-05-21 ENCOUNTER — Ambulatory Visit (HOSPITAL_BASED_OUTPATIENT_CLINIC_OR_DEPARTMENT_OTHER)

## 2024-05-21 ENCOUNTER — Encounter (HOSPITAL_BASED_OUTPATIENT_CLINIC_OR_DEPARTMENT_OTHER): Payer: Self-pay

## 2024-05-21 VITALS — Ht 70.0 in | Wt 299.0 lb

## 2024-05-21 DIAGNOSIS — Z0000 Encounter for general adult medical examination without abnormal findings: Secondary | ICD-10-CM | POA: Diagnosis not present

## 2024-05-21 MED ORDER — ALPRAZOLAM 0.5 MG PO TABS: 0.5000 mg | ORAL_TABLET | Freq: Every evening | ORAL | 1 refills | Status: AC | PRN

## 2024-05-21 NOTE — Patient Instructions (Addendum)
 [name],  Thank you for taking the time for your Medicare Wellness Visit. I appreciate your continued commitment to your health goals. Please review the care plan we discussed, and feel free to reach out if I can assist you further.  Please note that Annual Wellness Visits do not include a physical exam. Some assessments may be limited, especially if the visit was conducted virtually. If needed, we may recommend an in-person follow-up with your provider.  Ongoing Care Seeing your primary care provider every 3 to 6 months helps us  monitor your health and provide consistent, personalized care. Next office visit on 07/05/2024.  You are due for a Hep C screening and can get that done during your next office visit with provider.  Aim for 30 minutes of exercise or brisk walking, 6-8 glasses of water, and 5 servings of fruits and vegetables each day. I have sent a request to your provider in regards to a refill for Alprazolam  today.  Referrals If a referral was made during today's visit and you haven't received any updates within two weeks, please contact the referred provider directly to check on the status.  Recommended Screenings:  Health Maintenance  Topic Date Due   Eye exam for diabetics  Never done   Hepatitis C Screening  Never done   Colon Cancer Screening  Never done   Medicare Annual Wellness Visit  03/15/2024   Hemoglobin A1C  09/02/2024   COVID-19 Vaccine (11 - Moderna risk 2025-26 season) 09/02/2024   Yearly kidney health urinalysis for diabetes  11/28/2024   Yearly kidney function blood test for diabetes  12/03/2024   Complete foot exam   03/04/2025   DTaP/Tdap/Td vaccine (4 - Td or Tdap) 04/30/2033   Pneumococcal Vaccine for age over 57  Completed   Flu Shot  Completed   Zoster (Shingles) Vaccine  Completed   Meningitis B Vaccine  Aged Out       05/14/2024    4:56 PM  Advanced Directives  Does Patient Have a Medical Advance Directive? No    Vision: Annual vision  screenings are recommended for early detection of glaucoma, cataracts, and diabetic retinopathy. These exams can also reveal signs of chronic conditions such as diabetes and high blood pressure.  Dental: Annual dental screenings help detect early signs of oral cancer, gum disease, and other conditions linked to overall health, including heart disease and diabetes.  Please see the attached documents for additional preventive care recommendations.

## 2024-05-21 NOTE — Progress Notes (Signed)
 "  Chief Complaint  Patient presents with   Medicare Wellness     Subjective:   Francis Cardenas is a 73 y.o. male who presents for a Medicare Annual Wellness Visit.  Visit info / Clinical Intake: Medicare Wellness Visit Type:: Initial Annual Wellness Visit Persons participating in visit and providing information:: patient Medicare Wellness Visit Mode:: Telephone If telephone:: video declined Since this visit was completed virtually, some vitals may be partially provided or unavailable. Missing vitals are due to the limitations of the virtual format.: Documented vitals are patient reported If Telephone or Video please confirm:: I connected with patient using audio/video enable telemedicine. I verified patient identity with two identifiers, discussed telehealth limitations, and patient agreed to proceed. Patient Location:: Home Provider Location:: Home Interpreter Needed?: No Pre-visit prep was completed: yes Living arrangements:: (Patient-Rptd) lives with spouse/significant other Patient's Overall Health Status Rating: (!) (Patient-Rptd) poor Typical amount of pain: (!) (Patient-Rptd) a lot Does pain affect daily life?: (!) (Patient-Rptd) yes Are you currently prescribed opioids?: no  Dietary Habits and Nutritional Risks How many meals a day?: (Patient-Rptd) 2 Eats fruit and vegetables daily?: (!) (Patient-Rptd) no Most meals are obtained by: (Patient-Rptd) eating out; having others provide food In the last 2 weeks, have you had any of the following?: none Diabetic:: (!) yes Any non-healing wounds?: no How often do you check your BS?: 0 Would you like to be referred to a Nutritionist or for Diabetic Management? : no  Functional Status Activities of Daily Living (to include ambulation/medication): (!) (Patient-Rptd) Needs Assist Ambulation: Independent with device- listed below Home Assistive Devices/Equipment: CPAP; Walker (specify Type) (uses a rollator) Medication  Administration: (Patient-Rptd) Independent Home Management (perform basic housework or laundry): (Patient-Rptd) Needs assistance (comment) Manage your own finances?: (Patient-Rptd) yes Primary transportation is: (Patient-Rptd) driving Concerns about vision?: no *vision screening is required for WTM* Concerns about hearing?: no  Fall Screening Falls in the past year?: (Patient-Rptd) 1 Number of falls in past year: (Patient-Rptd) 1 Was there an injury with Fall?: (Patient-Rptd) 1 Fall Risk Category Calculator: (Patient-Rptd) 3 Patient Fall Risk Level: (Patient-Rptd) High Fall Risk  Fall Risk Patient at Risk for Falls Due to: Impaired balance/gait Fall risk Follow up: Falls evaluation completed; Falls prevention discussed  Home and Transportation Safety: All rugs have non-skid backing?: (Patient-Rptd) N/A, no rugs All stairs or steps have railings?: (Patient-Rptd) yes Grab bars in the bathtub or shower?: (Patient-Rptd) yes Have non-skid surface in bathtub or shower?: (Patient-Rptd) yes Good home lighting?: (Patient-Rptd) yes Regular seat belt use?: (Patient-Rptd) yes Hospital stays in the last year:: (!) (Patient-Rptd) yes How many hospital stays:: (Patient-Rptd) 1  Cognitive Assessment Difficulty concentrating, remembering, or making decisions? : (Patient-Rptd) yes Will 6CIT or Mini Cog be Completed: yes What year is it?: 0 points What month is it?: 0 points Give patient an address phrase to remember (5 components): [address] [place], [place] About what time is it?: 0 points Count backwards from 20 to 1: 0 points Say the months of the year in reverse: 0 points Repeat the address phrase from earlier: 0 points 6 CIT Score: 0 points  Advance Directives (For Healthcare) Does Patient Have a Medical Advance Directive?: No  Reviewed/Updated  Reviewed/Updated: Reviewed All (Medical, Surgical, Family, Medications, Allergies, Care Teams, Patient Goals)    Allergies  (verified) Oxcarbazepine  and Zolpidem    Current Medications (verified) Outpatient Encounter Medications as of 05/21/2024  Medication Sig   acetaminophen  (TYLENOL ) 325 MG tablet Take 2 tablets (650 mg total) by  mouth every 6 (six) hours as needed.   albuterol  (PROVENTIL  HFA;VENTOLIN  HFA) 108 (90 BASE) MCG/ACT inhaler Inhale 2 puffs into the lungs every 6 (six) hours as needed for wheezing or shortness of breath.   allopurinol  (ZYLOPRIM ) 100 MG tablet Take 1 tablet (100 mg total) by mouth daily.   allopurinol  (ZYLOPRIM ) 300 MG tablet Take 300 mg by mouth daily.   ALPRAZolam  (XANAX ) 1 MG tablet Take 1 tablet (1 mg total) by mouth at bedtime.   apixaban  (ELIQUIS ) 5 MG TABS tablet Take 1 tablet (5 mg total) by mouth 2 (two) times daily.   atorvastatin  (LIPITOR) 80 MG tablet Take 40 mg by mouth daily with supper. NOTIFY CLINIC FOR UNUSUAL MUSCLE ACHES OR WEAKNESS, BROWN URINE, BLOOD IN URINE, YELLOWING OF SKIN/WHITES OF EYES, PERSISTENT NAUSEA/VOMITING/DIARRHEA; REPLACES PRAVASTATIN  FOR CHOLESTEROL NOTIFY CLINIC FOR UNUSUAL MUSCLE ACHES OR WEAKNESS, BROWN URINE, BLOOD IN URINE, YELLOWING OF SKIN/WHITES OF EYES, PERSISTENT NAUSEA/VOMITING/DIARRHEA; REPLACES PRAVASTATIN    empagliflozin  (JARDIANCE ) 25 MG TABS tablet Take 1 tablet (25 mg total) by mouth daily.   fluticasone -salmeterol (WIXELA INHUB) 250-50 MCG/ACT AEPB Inhale 1 puff into the lungs in the morning and at bedtime.   gabapentin  (NEURONTIN ) 300 MG capsule Take 1 capsule (300 mg total) by mouth 2 (two) times daily.   guaifenesin  (HUMIBID E) 400 MG TABS tablet Take 400 mg by mouth daily.   Magnesium  Oxide 420 MG TABS Take 420 mg by mouth daily with supper.   omeprazole  (PRILOSEC) 20 MG capsule Take 20 mg by mouth daily with supper.   prednisoLONE  acetate (PRED FORTE ) 1 % ophthalmic suspension Place 1 drop into both eyes daily.   sacubitril -valsartan  (ENTRESTO ) 24-26 MG Take 1 tablet by mouth 2 (two) times daily.   tamsulosin  (FLOMAX ) 0.4 MG CAPS  capsule Take 0.4 mg by mouth daily after supper.   Tiotropium Bromide  Monohydrate (SPIRIVA  RESPIMAT) 2.5 MCG/ACT AERS Inhale 2 puffs into the lungs daily.   torsemide  (DEMADEX ) 20 MG tablet Take 1 tablet (20 mg total) by mouth daily. Hold dose for systolic blood pressure less than 100. Take an extra dose in the afternoon dose if weight up 5 lbs in a week   vitamin B-12 (CYANOCOBALAMIN ) 500 MCG tablet Take 500 mcg by mouth every evening.   No facility-administered encounter medications on file as of 05/21/2024.    History: Past Medical History:  Diagnosis Date   Alcohol  dependence (HCC)    Allergy [id number]   Asthma   Anxiety    Arthritis    Asthma    BPH (benign prostatic hyperplasia)    Cancer (HCC)    prostate   Cataract 2021   CHF (congestive heart failure) (HCC)    COPD (chronic obstructive pulmonary disease) (HCC)    Diabetes mellitus without complication (HCC)    Dyspnea on exertion 07/14/2022   Elevated troponin 04/06/2019   Fall at home, initial encounter 09/14/2022   GERD (gastroesophageal reflux disease)    Heart murmur    Hypercholesteremia    Hypertension    Myocardial infarction (HCC)    Neuromuscular disorder (HCC) 1989   OSA on CPAP    Sleep apnea 2002   Past Surgical History:  Procedure Laterality Date   ACHILLES TENDON REPAIR Left    CARDIAC CATHETERIZATION     CARPAL TUNNEL RELEASE Right    EYE SURGERY  2022   KNEE SURGERY Left    RIGHT/LEFT HEART CATH AND CORONARY ANGIOGRAPHY N/A 04/09/2019   Procedure: RIGHT/LEFT HEART CATH AND CORONARY ANGIOGRAPHY;  Surgeon:  Jordan, Peter M, MD;  Location[facility] INVASIVE CV LAB;  Service: Cardiovascular;  Laterality: N/A;   TONSILLECTOMY     WRIST FRACTURE SURGERY Left    Family History  Problem Relation Age of Onset   CAD Mother    Leukemia Mother    Cancer Mother    Depression Mother    Heart attack Father    Alcohol  abuse Father    Heart disease Father    Obesity Father    Stroke Father    Heart attack  Brother    Obesity Brother    Early death Brother    Heart disease Brother    Social History   Occupational History   Occupation: retired  Tobacco Use   Smoking status: Former    Current packs/day: 0.00    Average packs/day: 1 pack/day for 13.0 years (13.0 ttl pk-yrs)    Types: Cigarettes    Start date: 67    Quit date: 02/21/1979    Years since quitting: 45.2    Passive exposure: Past   Smokeless tobacco: Never   Tobacco comments:    Quit in 1980  Vaping Use   Vaping status: Never Used  Substance and Sexual Activity   Alcohol  use: Yes    Alcohol /week: 8.0 standard drinks of alcohol     Types: 8 Cans of beer per week    Comment: Daily   Drug use: No   Sexual activity: Not Currently   Tobacco Counseling Counseling given: Not Answered Tobacco comments: Quit in 1980  SDOH Screenings   Food Insecurity: No Food Insecurity (05/21/2024)  Housing: Unknown (05/21/2024)  Transportation Needs: No Transportation Needs (05/21/2024)  Utilities: Not At Risk (05/21/2024)  Alcohol  Screen: Medium Risk (03/03/2024)  Depression (PHQ2-9): Medium Risk (05/21/2024)  Financial Resource Strain: Low Risk (03/03/2024)  Physical Activity: Inactive (05/21/2024)  Social Connections: Socially Isolated (05/21/2024)  Stress: No Stress Concern Present (05/21/2024)  Tobacco Use: Medium Risk (05/21/2024)  Health Literacy: Adequate Health Literacy (05/21/2024)   See flowsheets for full screening details  Depression Screen PHQ 2 & 9 Depression Scale- Over the past 2 weeks, how often have you been bothered by any of the following problems? Little interest or pleasure in doing things: 0 Feeling down, depressed, or hopeless (PHQ Adolescent also includes...irritable): 3 (due to too much death in the world) PHQ-2 Total Score: 3 Trouble falling or staying asleep, or sleeping too much: 0 Feeling tired or having little energy: 3 Poor appetite or overeating (PHQ Adolescent also includes...weight loss): 1  (poor appetite) Feeling bad about yourself - or that you are a failure or have let yourself or your family down: 1 Trouble concentrating on things, such as reading the newspaper or watching television (PHQ Adolescent also includes...like school work): 0 Moving or speaking so slowly that other people could have noticed. Or the opposite - being so fidgety or restless that you have been moving around a lot more than usual: 0 Thoughts that you would be better off dead, or of hurting yourself in some way: 0 PHQ-9 Total Score: 8 If you checked off any problems, how difficult have these problems made it for you to do your work, take care of things at home, or get along with other people?: Somewhat difficult     Goals Addressed               This Visit's Progress     Patient Stated (pt-stated)        Would like to use weight/2025  Objective:    Today's Vitals   05/21/24 0814  Weight: 299 lb (135.6 kg)  Height: 5' 10 (1.778 m)   Body mass index is 42.9 kg/m.  Hearing/Vision screen Hearing Screening - Comments:: Denies hearing difficulties   Vision Screening - Comments:: Had cataract surgery/UTD/Dr. Elspeth Immunizations and Health Maintenance Health Maintenance  Topic Date Due   OPHTHALMOLOGY EXAM  Never done   Hepatitis C Screening  Never done   Colonoscopy  Never done   HEMOGLOBIN A1C  09/02/2024   COVID-19 Vaccine (11 - Moderna risk 2025-26 season) 09/02/2024   Diabetic kidney evaluation - Urine ACR  11/28/2024   Diabetic kidney evaluation - eGFR measurement  12/03/2024   FOOT EXAM  03/04/2025   Medicare Annual Wellness (AWV)  05/21/2025   DTaP/Tdap/Td (4 - Td or Tdap) 04/30/2033   Pneumococcal Vaccine: 50+ Years  Completed   Influenza Vaccine  Completed   Zoster Vaccines- Shingrix  Completed   Meningococcal B Vaccine  Aged Out        Assessment/Plan:  This is a routine wellness examination for Francis Cardenas.  Patient Care Team: [place], Quintin PARAS, MD as PCP  - General (Family Medicine) Lonni Slain, MD as PCP - Cardiology (Cardiology) Elspeth Lauraine DEL, OD as Referring Physician [facility], [facility]  I have personally reviewed and noted the following in the patients chart:   Medical and social history Use of alcohol , tobacco or illicit drugs  Current medications and supplements including opioid prescriptions. Functional ability and status Nutritional status Physical activity Advanced directives List of other physicians Hospitalizations, surgeries, and ER visits in previous 12 months Vitals Screenings to include cognitive, depression, and falls Referrals and appointments  No orders of the defined types were placed in this encounter.  In addition, I have reviewed and discussed with patient certain preventive protocols, quality metrics, and best practice recommendations. A written personalized care plan for preventive services as well as general preventive health recommendations were provided to patient.   Tacie Mccuistion L Javonnie Illescas, CMA   05/21/2024   Return in 1 year (on 05/21/2025).  After Visit Summary: (MyChart) Due to this being a telephonic visit, the after visit summary with patients personalized plan was offered to patient via MyChart   Nurse Notes: Patient stated that he has had a recent eye exam and also has had a recent Cologuard through the [facility] hospital.  I have sent out requests for record of both screenings today.  Patient is due for a Hep C screening and can get that during his next office visit.  He is also requesting a refill on ALPRAZolam  (XANAX ) 1 MG tablet (please advise).  "

## 2024-07-05 ENCOUNTER — Ambulatory Visit (HOSPITAL_BASED_OUTPATIENT_CLINIC_OR_DEPARTMENT_OTHER): Admitting: Family Medicine
